# Patient Record
Sex: Female | Born: 1965 | Race: Black or African American | Hispanic: No | Marital: Married | Smoking: Never smoker
Health system: Southern US, Community
[De-identification: ages and names within clinical notes are randomized; demographics above are authoritative.]

## PROBLEM LIST (undated history)

## (undated) DIAGNOSIS — D509 Iron deficiency anemia, unspecified: Secondary | ICD-10-CM

## (undated) DIAGNOSIS — A64 Unspecified sexually transmitted disease: Secondary | ICD-10-CM

## (undated) DIAGNOSIS — G43909 Migraine, unspecified, not intractable, without status migrainosus: Secondary | ICD-10-CM

## (undated) DIAGNOSIS — R87610 Atypical squamous cells of undetermined significance on cytologic smear of cervix (ASC-US): Secondary | ICD-10-CM

## (undated) DIAGNOSIS — A630 Anogenital (venereal) warts: Secondary | ICD-10-CM

## (undated) DIAGNOSIS — D071 Carcinoma in situ of vulva: Secondary | ICD-10-CM

## (undated) DIAGNOSIS — Z87410 Personal history of cervical dysplasia: Secondary | ICD-10-CM

## (undated) DIAGNOSIS — C19 Malignant neoplasm of rectosigmoid junction: Secondary | ICD-10-CM

## (undated) DIAGNOSIS — Z923 Personal history of irradiation: Secondary | ICD-10-CM

## (undated) DIAGNOSIS — E785 Hyperlipidemia, unspecified: Secondary | ICD-10-CM

## (undated) DIAGNOSIS — I2699 Other pulmonary embolism without acute cor pulmonale: Secondary | ICD-10-CM

## (undated) DIAGNOSIS — Z87898 Personal history of other specified conditions: Secondary | ICD-10-CM

## (undated) DIAGNOSIS — E782 Mixed hyperlipidemia: Secondary | ICD-10-CM

## (undated) DIAGNOSIS — E119 Type 2 diabetes mellitus without complications: Secondary | ICD-10-CM

## (undated) DIAGNOSIS — R87810 Cervical high risk human papillomavirus (HPV) DNA test positive: Secondary | ICD-10-CM

## (undated) DIAGNOSIS — E079 Disorder of thyroid, unspecified: Secondary | ICD-10-CM

## (undated) DIAGNOSIS — Z973 Presence of spectacles and contact lenses: Secondary | ICD-10-CM

## (undated) DIAGNOSIS — Z87411 Personal history of vaginal dysplasia: Secondary | ICD-10-CM

## (undated) DIAGNOSIS — E039 Hypothyroidism, unspecified: Secondary | ICD-10-CM

## (undated) DIAGNOSIS — Z9221 Personal history of antineoplastic chemotherapy: Secondary | ICD-10-CM

## (undated) DIAGNOSIS — R3915 Urgency of urination: Secondary | ICD-10-CM

## (undated) DIAGNOSIS — Z7901 Long term (current) use of anticoagulants: Secondary | ICD-10-CM

## (undated) DIAGNOSIS — R51 Headache: Secondary | ICD-10-CM

## (undated) HISTORY — DX: Unspecified sexually transmitted disease: A64

## (undated) HISTORY — PX: RECTAL TUMOR BY PROCTOTOMY EXCISION: SUR550

## (undated) HISTORY — DX: Hyperlipidemia, unspecified: E78.5

## (undated) HISTORY — DX: Cervical high risk human papillomavirus (HPV) DNA test positive: R87.810

## (undated) HISTORY — DX: Anogenital (venereal) warts: A63.0

## (undated) HISTORY — PX: OTHER SURGICAL HISTORY: SHX169

## (undated) HISTORY — DX: Atypical squamous cells of undetermined significance on cytologic smear of cervix (ASC-US): R87.610

## (undated) HISTORY — DX: Other pulmonary embolism without acute cor pulmonale: I26.99

## (undated) HISTORY — PX: COLON SURGERY: SHX602

---

## 1989-10-21 DIAGNOSIS — A64 Unspecified sexually transmitted disease: Secondary | ICD-10-CM

## 1989-10-21 HISTORY — DX: Unspecified sexually transmitted disease: A64

## 2000-01-17 ENCOUNTER — Other Ambulatory Visit: Admission: RE | Admit: 2000-01-17 | Discharge: 2000-01-17 | Payer: Self-pay | Admitting: Obstetrics and Gynecology

## 2001-01-29 ENCOUNTER — Other Ambulatory Visit: Admission: RE | Admit: 2001-01-29 | Discharge: 2001-01-29 | Payer: Self-pay | Admitting: Obstetrics and Gynecology

## 2002-02-15 ENCOUNTER — Other Ambulatory Visit: Admission: RE | Admit: 2002-02-15 | Discharge: 2002-02-15 | Payer: Self-pay | Admitting: *Deleted

## 2003-02-17 ENCOUNTER — Other Ambulatory Visit: Admission: RE | Admit: 2003-02-17 | Discharge: 2003-02-17 | Payer: Self-pay | Admitting: Gynecology

## 2003-05-17 ENCOUNTER — Encounter: Admission: RE | Admit: 2003-05-17 | Discharge: 2003-05-17 | Payer: Self-pay | Admitting: Obstetrics and Gynecology

## 2003-05-17 ENCOUNTER — Encounter: Payer: Self-pay | Admitting: Obstetrics and Gynecology

## 2004-01-09 ENCOUNTER — Other Ambulatory Visit: Admission: RE | Admit: 2004-01-09 | Discharge: 2004-01-09 | Payer: Self-pay | Admitting: Gynecology

## 2004-01-17 ENCOUNTER — Inpatient Hospital Stay (HOSPITAL_COMMUNITY): Admission: AD | Admit: 2004-01-17 | Discharge: 2004-01-17 | Payer: Self-pay | Admitting: Gynecology

## 2004-05-01 ENCOUNTER — Encounter: Admission: RE | Admit: 2004-05-01 | Discharge: 2004-05-03 | Payer: Self-pay | Admitting: Gynecology

## 2004-07-24 ENCOUNTER — Inpatient Hospital Stay (HOSPITAL_COMMUNITY): Admission: AD | Admit: 2004-07-24 | Discharge: 2004-07-28 | Payer: Self-pay | Admitting: Gynecology

## 2004-07-25 ENCOUNTER — Encounter (INDEPENDENT_AMBULATORY_CARE_PROVIDER_SITE_OTHER): Payer: Self-pay | Admitting: Specialist

## 2004-09-05 ENCOUNTER — Other Ambulatory Visit: Admission: RE | Admit: 2004-09-05 | Discharge: 2004-09-05 | Payer: Self-pay | Admitting: Obstetrics and Gynecology

## 2004-12-18 ENCOUNTER — Ambulatory Visit: Payer: Self-pay | Admitting: Cardiology

## 2004-12-26 ENCOUNTER — Ambulatory Visit: Payer: Self-pay

## 2005-11-19 ENCOUNTER — Other Ambulatory Visit: Admission: RE | Admit: 2005-11-19 | Discharge: 2005-11-19 | Payer: Self-pay | Admitting: Gynecology

## 2006-07-15 ENCOUNTER — Ambulatory Visit: Payer: Self-pay | Admitting: Cardiology

## 2006-07-23 ENCOUNTER — Ambulatory Visit: Payer: Self-pay

## 2008-08-12 ENCOUNTER — Encounter: Payer: Self-pay | Admitting: Gynecology

## 2008-08-12 ENCOUNTER — Other Ambulatory Visit: Admission: RE | Admit: 2008-08-12 | Discharge: 2008-08-12 | Payer: Self-pay | Admitting: Gynecology

## 2008-08-12 ENCOUNTER — Ambulatory Visit: Payer: Self-pay | Admitting: Gynecology

## 2009-02-20 ENCOUNTER — Ambulatory Visit: Payer: Self-pay | Admitting: Gynecology

## 2009-08-17 ENCOUNTER — Other Ambulatory Visit: Admission: RE | Admit: 2009-08-17 | Discharge: 2009-08-17 | Payer: Self-pay | Admitting: Gynecology

## 2009-08-17 ENCOUNTER — Encounter: Payer: Self-pay | Admitting: Gynecology

## 2009-08-17 ENCOUNTER — Ambulatory Visit: Payer: Self-pay | Admitting: Gynecology

## 2009-08-21 ENCOUNTER — Encounter: Admission: RE | Admit: 2009-08-21 | Discharge: 2009-08-21 | Payer: Self-pay | Admitting: Internal Medicine

## 2009-08-29 ENCOUNTER — Encounter: Admission: RE | Admit: 2009-08-29 | Discharge: 2009-08-29 | Payer: Self-pay | Admitting: Internal Medicine

## 2010-10-21 DIAGNOSIS — I2699 Other pulmonary embolism without acute cor pulmonale: Secondary | ICD-10-CM

## 2010-10-21 DIAGNOSIS — C19 Malignant neoplasm of rectosigmoid junction: Secondary | ICD-10-CM

## 2010-10-21 HISTORY — PX: INSERTION OF VENA CAVA FILTER: SHX5871

## 2010-10-21 HISTORY — DX: Other pulmonary embolism without acute cor pulmonale: I26.99

## 2010-10-21 HISTORY — DX: Malignant neoplasm of rectosigmoid junction: C19

## 2010-11-06 ENCOUNTER — Other Ambulatory Visit: Payer: Self-pay | Admitting: Gynecology

## 2010-11-06 ENCOUNTER — Ambulatory Visit
Admission: RE | Admit: 2010-11-06 | Discharge: 2010-11-06 | Payer: Self-pay | Source: Home / Self Care | Attending: Gynecology | Admitting: Gynecology

## 2010-11-06 ENCOUNTER — Other Ambulatory Visit
Admission: RE | Admit: 2010-11-06 | Discharge: 2010-11-06 | Payer: Self-pay | Source: Home / Self Care | Admitting: Gynecology

## 2010-11-12 ENCOUNTER — Ambulatory Visit
Admission: RE | Admit: 2010-11-12 | Discharge: 2010-11-12 | Payer: Self-pay | Source: Home / Self Care | Attending: Gynecology | Admitting: Gynecology

## 2010-12-22 ENCOUNTER — Emergency Department (HOSPITAL_COMMUNITY): Payer: 59

## 2010-12-22 ENCOUNTER — Inpatient Hospital Stay (HOSPITAL_COMMUNITY)
Admission: EM | Admit: 2010-12-22 | Discharge: 2010-12-25 | DRG: 176 | Disposition: A | Discharge status: Home / Self Care | Payer: 59 | Attending: Internal Medicine | Admitting: Internal Medicine

## 2010-12-22 DIAGNOSIS — R079 Chest pain, unspecified: Secondary | ICD-10-CM | POA: Diagnosis present

## 2010-12-22 DIAGNOSIS — Z86711 Personal history of pulmonary embolism: Secondary | ICD-10-CM

## 2010-12-22 DIAGNOSIS — E119 Type 2 diabetes mellitus without complications: Secondary | ICD-10-CM | POA: Diagnosis present

## 2010-12-22 DIAGNOSIS — T384X5A Adverse effect of oral contraceptives, initial encounter: Secondary | ICD-10-CM | POA: Diagnosis present

## 2010-12-22 DIAGNOSIS — I2699 Other pulmonary embolism without acute cor pulmonale: Principal | ICD-10-CM | POA: Diagnosis present

## 2010-12-22 HISTORY — DX: Personal history of pulmonary embolism: Z86.711

## 2010-12-22 LAB — DIFFERENTIAL
Eosinophils Relative: 2 % (ref 0–5)
Lymphocytes Relative: 22 % (ref 12–46)
Lymphs Abs: 1.1 10*3/uL (ref 0.7–4.0)
Monocytes Absolute: 0.5 10*3/uL (ref 0.1–1.0)
Neutro Abs: 3.4 10*3/uL (ref 1.7–7.7)

## 2010-12-22 LAB — COMPREHENSIVE METABOLIC PANEL
ALT: 15 U/L (ref 0–35)
AST: 14 U/L (ref 0–37)
Albumin: 3.6 g/dL (ref 3.5–5.2)
Alkaline Phosphatase: 92 U/L (ref 39–117)
CO2: 24 mEq/L (ref 19–32)
Chloride: 107 mEq/L (ref 96–112)
Creatinine, Ser: 0.81 mg/dL (ref 0.4–1.2)
GFR calc Af Amer: >60 mL/min (ref >60)
GFR calc non Af Amer: >60 mL/min (ref >60)
Potassium: 3.8 mEq/L (ref 3.5–5.1)
Total Bilirubin: 0.8 mg/dL (ref 0.3–1.2)

## 2010-12-22 LAB — GLUCOSE, CAPILLARY
Glucose-Capillary: 130 mg/dL — ABNORMAL HIGH (ref 70–99)
Glucose-Capillary: 130 mg/dL — ABNORMAL HIGH (ref 70–99)

## 2010-12-22 LAB — CBC
HCT: 38.9 % (ref 36.0–46.0)
Hemoglobin: 13.1 g/dL (ref 12.0–15.0)
MCHC: 33.7 g/dL (ref 30.0–36.0)
MCV: 90.5 fL (ref 78.0–100.0)
RDW: 12.2 % (ref 11.5–15.5)
WBC: 5.1 10*3/uL (ref 4.0–10.5)

## 2010-12-22 LAB — URINALYSIS, ROUTINE W REFLEX MICROSCOPIC
Bilirubin Urine: NEGATIVE
Glucose, UA: NEGATIVE mg/dL
Hgb urine dipstick: NEGATIVE
Specific Gravity, Urine: 1.034 — ABNORMAL HIGH (ref 1.005–1.030)
Urobilinogen, UA: 1 mg/dL (ref 0.0–1.0)

## 2010-12-22 LAB — D-DIMER, QUANTITATIVE: D-Dimer, Quant: 2.58 ug/mL-FEU — ABNORMAL HIGH (ref 0.00–0.48)

## 2010-12-22 LAB — CARDIAC PANEL(CRET KIN+CKTOT+MB+TROPI)
CK, MB: 0.8 ng/mL (ref 0.3–4.0)
Relative Index: INVALID (ref 0.0–2.5)
Total CK: 59 U/L (ref 7–177)
Troponin I: <0.01 ng/mL (ref 0.00–0.06)
Troponin I: <0.01 ng/mL (ref 0.00–0.06)

## 2010-12-22 LAB — POCT PREGNANCY, URINE: Preg Test, Ur: NEGATIVE

## 2010-12-22 MED ORDER — IOHEXOL 300 MG/ML  SOLN
100.0000 mL | Freq: Once | INTRAMUSCULAR | Status: AC | PRN
  Administered 2010-12-22: 100 mL via INTRAVENOUS

## 2010-12-23 LAB — GLUCOSE, CAPILLARY: Glucose-Capillary: 138 mg/dL — ABNORMAL HIGH (ref 70–99)

## 2010-12-23 LAB — CBC
HCT: 35.5 % — ABNORMAL LOW (ref 36.0–46.0)
Hemoglobin: 11.6 g/dL — ABNORMAL LOW (ref 12.0–15.0)
MCH: 29.9 pg (ref 26.0–34.0)
MCV: 91.5 fL (ref 78.0–100.0)
Platelets: 219 10*3/uL (ref 150–400)
RBC: 3.88 MIL/uL (ref 3.87–5.11)
WBC: 4.6 10*3/uL (ref 4.0–10.5)

## 2010-12-23 LAB — COMPREHENSIVE METABOLIC PANEL
ALT: 12 U/L (ref 0–35)
AST: 11 U/L (ref 0–37)
Albumin: 3.2 g/dL — ABNORMAL LOW (ref 3.5–5.2)
CO2: 27 mEq/L (ref 19–32)
Calcium: 9 mg/dL (ref 8.4–10.5)
Chloride: 106 mEq/L (ref 96–112)
Creatinine, Ser: 0.79 mg/dL (ref 0.4–1.2)
GFR calc Af Amer: >60 mL/min (ref >60)
Sodium: 139 mEq/L (ref 135–145)
Total Bilirubin: 0.8 mg/dL (ref 0.3–1.2)

## 2010-12-23 LAB — HEMOGLOBIN A1C
Hgb A1c MFr Bld: 6.5 % — ABNORMAL HIGH (ref <5.7)
Mean Plasma Glucose: 140 mg/dL — ABNORMAL HIGH (ref <117)

## 2010-12-23 LAB — APTT: aPTT: 70 seconds — ABNORMAL HIGH (ref 24–37)

## 2010-12-24 LAB — CBC
HCT: 35.9 % — ABNORMAL LOW (ref 36.0–46.0)
Platelets: 246 10*3/uL (ref 150–400)
RBC: 3.93 MIL/uL (ref 3.87–5.11)
RDW: 12.1 % (ref 11.5–15.5)

## 2010-12-24 LAB — PROTEIN S ACTIVITY: Protein S Activity: 90 % (ref 69–129)

## 2010-12-24 LAB — LUPUS ANTICOAGULANT PANEL
DRVVT: 45 secs — ABNORMAL HIGH (ref 36.2–44.3)
Lupus Anticoagulant: NOT DETECTED
PTT Lupus Anticoagulant: 59.4 secs — ABNORMAL HIGH (ref 30.0–45.6)
dRVVT Incubated 1:1 Mix: 41.7 secs (ref 36.2–44.3)

## 2010-12-24 LAB — PROTIME-INR
INR: 1.7 — ABNORMAL HIGH (ref 0.00–1.49)
Prothrombin Time: 20.2 seconds — ABNORMAL HIGH (ref 11.6–15.2)

## 2010-12-24 LAB — GLUCOSE, CAPILLARY
Glucose-Capillary: 132 mg/dL — ABNORMAL HIGH (ref 70–99)
Glucose-Capillary: 129 mg/dL — ABNORMAL HIGH (ref 70–99)

## 2010-12-25 LAB — CBC
HCT: 39.7 % (ref 36.0–46.0)
MCHC: 32.5 g/dL (ref 30.0–36.0)
Platelets: 263 10*3/uL (ref 150–400)
RDW: 12 % (ref 11.5–15.5)
WBC: 4.7 10*3/uL (ref 4.0–10.5)

## 2010-12-25 LAB — CARDIOLIPIN ANTIBODIES, IGG, IGM, IGA
Anticardiolipin IgA: 4 APL U/mL — ABNORMAL LOW (ref <22)
Anticardiolipin IgG: 9 GPL U/mL — ABNORMAL LOW (ref <23)

## 2010-12-25 LAB — PROTIME-INR
INR: 1.78 — ABNORMAL HIGH (ref 0.00–1.49)
Prothrombin Time: 20.9 seconds — ABNORMAL HIGH (ref 11.6–15.2)

## 2010-12-25 LAB — BETA-2-GLYCOPROTEIN I ABS, IGG/M/A
Beta-2 Glyco I IgG: 0 G Units (ref <20)
Beta-2-Glycoprotein I IgA: 4 A Units (ref <20)

## 2010-12-25 LAB — GLUCOSE, CAPILLARY: Glucose-Capillary: 144 mg/dL — ABNORMAL HIGH (ref 70–99)

## 2010-12-26 LAB — GLUCOSE, CAPILLARY: Glucose-Capillary: 131 mg/dL — ABNORMAL HIGH (ref 70–99)

## 2011-01-17 ENCOUNTER — Telehealth: Payer: Self-pay | Admitting: Internal Medicine

## 2011-01-17 DIAGNOSIS — I2699 Other pulmonary embolism without acute cor pulmonale: Secondary | ICD-10-CM

## 2011-01-17 NOTE — Telephone Encounter (Signed)
Dr Loreta Ave called. Thsi patient tel [phone] has PE but no pulmonary . Now recutal bleeding but has not dropped hgb so far. Dr. Loreta Ave wants her seen in coumadin clinic [place] on Friday 01/18/2011 so patient can come off coumadin and lovenox bridge and she can do colonoscopy early next week. Patient also needs pulm for PE. So, DR. Loreta Ave wants me to see patient ASAP. Pls give patient appt to see me Monday 01/21/2011

## 2011-01-18 ENCOUNTER — Encounter: Payer: Self-pay | Admitting: *Deleted

## 2011-01-18 ENCOUNTER — Ambulatory Visit (INDEPENDENT_AMBULATORY_CARE_PROVIDER_SITE_OTHER): Payer: 59 | Admitting: *Deleted

## 2011-01-18 DIAGNOSIS — I2699 Other pulmonary embolism without acute cor pulmonale: Secondary | ICD-10-CM

## 2011-01-18 LAB — POCT INR: INR: 2.3

## 2011-01-18 MED ORDER — ENOXAPARIN SODIUM 100 MG/ML ~~LOC~~ SOLN: 100.0000 mg | Freq: Two times a day (BID) | SUBCUTANEOUS | Status: DC

## 2011-01-18 NOTE — Patient Instructions (Addendum)
INR 2.3  Start Lovenox 100mg  injection every 12 hours on Saturday morning.  Take your last dose of Coumadin the morning before your procedure is scheduled.  After your procedure, Dr. Loreta Ave will tell you when it is okay to restart your anticoagulation.  Restart Coumadin AND Lovenox.  Take 7.5mg  x 2 days of Coumadin then resume same dose of 5mg  daily except 7.5mg  on Monday, Wednesday and Friday.  Continue Lovenox 100mg  every 12 hours until INR is >2.0.  Make appt with Dr. [name] ~5 days after your procedure to recheck INR.

## 2011-01-18 NOTE — Patient Instructions (Signed)
INR 2.3  Start Lovenox 100mg  injection into abdomen on Saturday morning.  Take 1 injection every 12 hours.  The day prior to your injection, only take your Lovenox in the morning.  Dr. Loreta Ave will let you know when it is okay to restart anticoagulation after your colonoscopy.  Restart Coumadin AND Lovenox.  Take 7.5mg  of Coumadin x 2 days then resume your normal dose of 5mg  daily except 7.5mg  on Monday, Wednesday and Friday.  Continue Lovenox 100mg  every 12 hours until your INR is >2.0.  Make an appt with Dr. [name] approximately 5 days after your procedure to recheck your INR.

## 2011-01-18 NOTE — Telephone Encounter (Signed)
Order placed for coumadin clinic referral and appt set with MR on Monday at 3pm. Pt aware. Carron Curie, CMA

## 2011-01-18 NOTE — Progress Notes (Signed)
See anticoag note

## 2011-01-21 ENCOUNTER — Encounter: Payer: Self-pay | Admitting: Internal Medicine

## 2011-01-21 ENCOUNTER — Ambulatory Visit (INDEPENDENT_AMBULATORY_CARE_PROVIDER_SITE_OTHER): Payer: 59 | Admitting: Internal Medicine

## 2011-01-21 VITALS — BP 124/86 | HR 64 | Temp 98.1°F | Ht 71.0 in | Wt 206.6 lb

## 2011-01-21 DIAGNOSIS — I2699 Other pulmonary embolism without acute cor pulmonale: Secondary | ICD-10-CM

## 2011-01-21 NOTE — Progress Notes (Signed)
Subjective:    Patient ID: [name], female    DOB: 1966-09-13, 45 y.o.   MRN: [id number]  HPI 45 year old overweight female who has a sedentary Sales promotion account executive job with [employer]. Never smoker. Had been on oral contraceptives since 2005.  Past 1 year - longer work hours (10-12 hour days). Since Jan 2012 noticed non specific bilateral lower extremity aches. Then, new diagnosis of diabetes end feb 2012 (exercise and diet regimen). Developed acute  rt lower chest and rt upper shoulder pleuritic pain on 12/21/2010 but became excruciating on 12/22/2010. Did not have associated dyspnea, hemoptysis, pedal edema In ER d-dimer high. CT chest showed bilateral lowe lobe PE but she was not hypoxemic. Had normal antithrombin III, protein C and protein S levels and negative facto 5 def  per dc summary. Denies increased sedentary levels or recent travel prior to PE. Rx with anticoagulation. Mammogram end 2010 normal per hx. Pap smear Jan 2012 normal per hx. Improved pain at discharge. However, still has some residual right sided chest discomfort that is mild, and brought on by lot of body movements. INR being tracked by PA of Dr. [name]. On Friday 01/18/2011 INR 3.3.  However, around 2 weeks ago noticed rectal bleeding while on coumadin Rx. Mixed with stools and notices it at end and when she wipes bottom. So saw Dr. Loreta Ave on 01/18/2011 - hgb unchanged per Dr. Loreta Ave verbal report. Denies straining or constipation, abdominal pain, nausea, vomit, diarrhea. Currently on lovenox shots for possible colonoscopy.  Review of Systems   Constitutional:   No  weight loss, night sweats,  Fevers, chills, fatigue, lassitude. HEENT:   No headaches,  Difficulty swallowing,  Tooth/dental problems,  Sore throat,                No sneezing, itching, ear ache, nasal congestion, post nasal drip,   CV:   Orthopnea, PND, swelling in lower extremities, anasarca, dizziness, palpitations. RT MUSCOLOSKELETAL PAIN +  GI  No heartburn,  indigestion, abdominal pain, nausea, vomiting, diarrhea, change in bowel habits, loss of appetite  Resp: No shortness of breath with exertion or at rest.  No excess mucus, no productive cough,  No non-productive cough,  No coughing up of blood.  No change in color of mucus.  No wheezing.  No chest wall deformity  Skin: no rash or lesions.  GU: no dysuria, change in color of urine, no urgency or frequency.  No flank pain.  MS:  No joint pain or swelling.  No decreased range of motion.  No back pain.  Psych:  No change in mood or affect. No depression or anxiety.  No memory loss.    Objective:   Physical Exam  Vitals reviewed. Constitutional: She is oriented to person, place, and time. She appears well-developed and well-nourished. No distress.  HENT:  Head: Normocephalic and atraumatic.  Right Ear: External ear normal.  Left Ear: External ear normal.  Mouth/Throat: Oropharynx is clear and moist. No oropharyngeal exudate.  Eyes: Conjunctivae and EOM are normal. Pupils are equal, round, and reactive to light. Right eye exhibits no discharge. Left eye exhibits no discharge. No scleral icterus.  Neck: Normal range of motion. Neck supple. No JVD present. No tracheal deviation present. No thyromegaly present.  Cardiovascular: Normal rate, regular rhythm, normal heart sounds and intact distal pulses.  Exam reveals no gallop and no friction rub.   No murmur heard. Pulmonary/Chest: Effort normal and breath sounds normal. No respiratory distress. She has no  wheezes. She has no rales. She exhibits no tenderness.  Abdominal: Soft. Bowel sounds are normal. She exhibits no distension and no mass. There is no tenderness. There is no rebound and no guarding.  Musculoskeletal: Normal range of motion. She exhibits no edema and no tenderness.  Lymphadenopathy:    She has no cervical adenopathy.  Neurological: She is alert and oriented to person, place, and time. She has normal reflexes. No cranial nerve  deficit. She exhibits normal muscle tone. Coordination normal.  Skin: Skin is warm and dry. No rash noted. She is not diaphoretic. No erythema. No pallor.  Psychiatric: She has a normal mood and affect. Her behavior is normal. Judgment and thought content normal.          Assessment & Plan:

## 2011-01-21 NOTE — Patient Instructions (Signed)
Avoid oral contraceptives Follow coumadin advice per Dr. [name] and coumadin clinic Do not take lovenox night before and morning of your procedure Can start non-collision, non-fall risk exercise REturn to see me in 2 months for pain If pain gets worse come sooner Till then try tylenol or ibuprofen as needed for pain

## 2011-01-21 NOTE — Assessment & Plan Note (Signed)
New onset PE March 3. 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 So far other risk factors are negative. Currently well and does not desaturate but has right musculoskeletal pain that I Suspect is resolving pleurisy. She should be on anticoagulation minimum 6 months. At end of that Rx, I will reassess d-dimer and hypercoag profile and discuss longer Rx. IF pain persists past 2 months, then repeat VQ scan. Ok to have colonoscopy (coumadin clinic gave instructions) this week after 01/23/2011

## 2011-01-23 ENCOUNTER — Other Ambulatory Visit: Payer: Self-pay | Admitting: Gastroenterology

## 2011-01-23 DIAGNOSIS — R933 Abnormal findings on diagnostic imaging of other parts of digestive tract: Secondary | ICD-10-CM

## 2011-01-24 ENCOUNTER — Other Ambulatory Visit: Payer: 59

## 2011-01-24 ENCOUNTER — Ambulatory Visit (HOSPITAL_COMMUNITY)
Admission: RE | Admit: 2011-01-24 | Discharge: 2011-01-24 | Disposition: A | Discharge status: Home / Self Care | Payer: 59 | Source: Ambulatory Visit | Attending: Vascular Surgery | Admitting: Vascular Surgery

## 2011-01-24 DIAGNOSIS — C189 Malignant neoplasm of colon, unspecified: Secondary | ICD-10-CM | POA: Insufficient documentation

## 2011-01-24 DIAGNOSIS — T81718A Complication of other artery following a procedure, not elsewhere classified, initial encounter: Secondary | ICD-10-CM

## 2011-01-24 DIAGNOSIS — I2699 Other pulmonary embolism without acute cor pulmonale: Secondary | ICD-10-CM | POA: Insufficient documentation

## 2011-01-24 LAB — POCT I-STAT, CHEM 8
Creatinine, Ser: 0.9 mg/dL (ref 0.4–1.2)
HCT: 40 % (ref 36.0–46.0)
Hemoglobin: 13.6 g/dL (ref 12.0–15.0)
Potassium: 3.5 mEq/L (ref 3.5–5.1)
Sodium: 140 mEq/L (ref 135–145)
TCO2: 22 mmol/L (ref 0–100)

## 2011-01-24 LAB — PROTIME-INR
INR: 1.13 (ref 0.00–1.49)
Prothrombin Time: 14.7 seconds (ref 11.6–15.2)

## 2011-01-24 LAB — HCG, SERUM, QUALITATIVE: Preg, Serum: NEGATIVE

## 2011-01-25 NOTE — Op Note (Signed)
NAMEMARCEIL, [name]                ACCOUNT NO.:  [id number]  MEDICAL RECORD NO.:  [id number]           PATIENT TYPE:  O  LOCATION:  SDSC                         FACILITY:  [facility]  PHYSICIAN:  Fransisco Hertz, MD       DATE OF BIRTH:  1965-11-02  DATE OF PROCEDURE: DATE OF DISCHARGE:                              OPERATIVE REPORT   PROCEDURE: 1. Left common femoral vein cannulation under ultrasound guidance. 2. Inferior vena cavogram. 3. Placement of OptEase inferior vena caval filter.  PREOPERATIVE DIAGNOSES:  History of pulmonary embolism and colon cancer.  POSTOPERATIVE DIAGNOSES:  History of pulmonary embolism and colon cancer.  SURGEON:  Arlys John L. Imogene Burn, MD  ANESTHESIA:  Local.  ESTIMATED BLOOD LOSS:  Minimal.  CONTRAST:  30 mL.  SPECIMENS:  None.  FINDINGS:  The lowest renal vein was visualized at the bottom of L1. The inferior vena caval filter was deployed slightly below the top of L2 in proper orientation.INDICATIONS:  This is a 45 year old female that recently had a pulmonary embolism.  Her workup for such discovered a colon cancer.  The patient now is being scheduled for a colectomy and subsequently her anticoagulation with Coumadin had to be interrupted.  She is going to subsequently need a retrievable inferior vena caval filter placement during the immediate perioperative period to prevent her from having a death due to pulmonary embolism.  The patient is aware of the risks of this procedure including bleeding, infection, possible complications related to the inferior vena caval filter including migration, puncture of adjacent structures, filter fracture and possible inability to retrieve the filter.  She is aware of these risks and agreed to proceed forward with such.  DESCRIPTION OF THE OPERATION:  After full informed written consent was obtained from the patient, she was brought to the angio suite, placed supine upon angio table.  She was connected to the  monitoring equipment and then she was prepped and draped in standard fashion for right femoral cannulation.  I turned my attention to her right groin under ultrasound guidance.  I cannulated the right common femoral vein.  With an 18-gauge needle, I passed a Bentson wire up into the inferior vena cava.  Then the needle was exchanged for the OpTease deployment sheath which was placed over the wire and advanced at the level of about L3-4. The dilator was then connected to power injector and then a power injection was completed.  The inferior vena cava was noted be widely patent and of adequate size for use of the OpTease device.  At this point the sheath and dilator were repositioned to the level of the top of L2.  We then removed the dilator and then loaded the sheath with the  appropriate orientation of the OpTease device.  Using the push rod, we pushed until the top of the sheath.  The device was then deployed while holding the rod and pulling back the sheath.  This deployed the device a little bit lower than the top of L2.  It was in the proper orientation. We completed one last image to document the position of such.  At this point then the push rod was removed, the sheath was aspirated and flushed with heparinized saline and it was removed and pressure was held to the right groin for few minutes.  The patient sustained no bleeding complications.  COMPLICATIONS:  None.  Condition was stable.     Fransisco Hertz, MD     BLC/MEDQ  D:  01/24/2011  T:  01/25/2011  Job:  [id number]  Electronically Signed by Leonides Sake MD on 01/25/2011 05:41:33 PM

## 2011-01-26 NOTE — Discharge Summary (Signed)
  [name], [name]                ACCOUNT NO.:  [id number]  MEDICAL RECORD NO.:  [id number]           PATIENT TYPE:  I  LOCATION:  1420                         FACILITY:  [facility]  PHYSICIAN:  Richarda Overlie, MD       DATE OF BIRTH:  1966/07/26  DATE OF ADMISSION:  12/22/2010 DATE OF DISCHARGE:  12/25/2010                        DISCHARGE SUMMARY - REFERRING   DISCHARGE DIAGNOSES: 1. Bilateral lower lobe pulmonary embolism, likely secondary to OCP     use. 2. Chest pain secondary to pulmonary embolism.  SUBJECTIVE:  This is a 45 year old female with new onset diabetes and also history of use of oral contraceptives who presents to the ED with right-sided chest pain as well as back pain.  She has an elevated D- dimer in the ED and a CT angiogram revealed that the patient has bilateral lower lobe pulmonary embolism.  The patient was admitted for further evaluation.  Bilateral PE, so the patient will be started on Lovenox and Coumadin, and a hypercoagulable panel was done that showed normal antithrombin III, protein C and protein S levels.  The patient was initiated on Coumadin with a quick jump in her INR from normal to 1.78 and therefore the patient was hospitalized for another 3 days.  The patient's INR prior to discharge was 1.78.  She has been advised to continue with Lovenox injections and discontinue Lovenox after March 8. She will follow up with Dr. Andi Devon for her INR checks.  She has been advised to discontinue her oral contraceptive use.  She has some significant relief as far as her back pain is concerned with some p.r.n. Percocet in the hospital.  She was found to be quite sensitive to IV Dilaudid therapy with significant nausea and vomiting.  PLAN:  The patient is being discharged today.  She will be assessed for home oxygen requirements prior to discharge and Dr. Andi Devon will be following the patient's INR outpatient.  She will need anticoagulation for  a minimum of 6 months.  DISCHARGE MEDICATIONS: 1. Coumadin 7.5 mg p.o. daily. 2. Lovenox 95 mg subcutaneous twice daily. 3. Percocet one to two tablets p.o. q.8 hours. 4. Excedrin Migraine one to two tablets p.o. q.6 p.r.n.     Richarda Overlie, MD     NA/MEDQ  D:  12/25/2010  T:  12/25/2010  Job:  [id number]  Electronically Signed by Richarda Overlie MD on 01/26/2011 08:34:57 PM

## 2011-01-28 ENCOUNTER — Ambulatory Visit
Admit: 2011-01-28 | Discharge: 2011-01-28 | Disposition: A | Discharge status: Home / Self Care | Payer: 59 | Attending: General Surgery | Admitting: General Surgery

## 2011-01-28 MED ORDER — IOHEXOL 300 MG/ML  SOLN
125.0000 mL | Freq: Once | INTRAMUSCULAR | Status: AC | PRN
  Administered 2011-01-28: 125 mL via INTRAVENOUS

## 2011-01-30 ENCOUNTER — Encounter (HOSPITAL_COMMUNITY)
Admission: RE | Admit: 2011-01-30 | Discharge: 2011-01-30 | Disposition: A | Discharge status: Home / Self Care | Payer: 59 | Source: Ambulatory Visit | Attending: General Surgery | Admitting: General Surgery

## 2011-01-30 LAB — CBC
HCT: 38.6 % (ref 36.0–46.0)
Hemoglobin: 13 g/dL (ref 12.0–15.0)
MCHC: 33.7 g/dL (ref 30.0–36.0)
RDW: 12.7 % (ref 11.5–15.5)
WBC: 3.7 10*3/uL — ABNORMAL LOW (ref 4.0–10.5)

## 2011-01-30 LAB — DIFFERENTIAL
Basophils Absolute: 0 10*3/uL (ref 0.0–0.1)
Basophils Relative: 1 % (ref 0–1)
Lymphocytes Relative: 39 % (ref 12–46)
Monocytes Absolute: 0.2 10*3/uL (ref 0.1–1.0)
Neutro Abs: 1.9 10*3/uL (ref 1.7–7.7)

## 2011-01-30 LAB — SURGICAL PCR SCREEN: MRSA, PCR: NEGATIVE

## 2011-01-31 ENCOUNTER — Inpatient Hospital Stay (HOSPITAL_COMMUNITY)
Admission: RE | Admit: 2011-01-31 | Discharge: 2011-02-05 | DRG: 333 | Disposition: A | Discharge status: Home / Self Care | Payer: 59 | Source: Ambulatory Visit | Attending: General Surgery | Admitting: General Surgery

## 2011-01-31 ENCOUNTER — Inpatient Hospital Stay (HOSPITAL_COMMUNITY): Admission: RE | Admit: 2011-01-31 | Payer: 59 | Source: Ambulatory Visit | Admitting: General Surgery

## 2011-01-31 ENCOUNTER — Other Ambulatory Visit: Payer: Self-pay | Admitting: General Surgery

## 2011-01-31 DIAGNOSIS — C772 Secondary and unspecified malignant neoplasm of intra-abdominal lymph nodes: Secondary | ICD-10-CM | POA: Diagnosis present

## 2011-01-31 DIAGNOSIS — Z23 Encounter for immunization: Secondary | ICD-10-CM

## 2011-01-31 DIAGNOSIS — C20 Malignant neoplasm of rectum: Principal | ICD-10-CM | POA: Diagnosis present

## 2011-01-31 DIAGNOSIS — Z7901 Long term (current) use of anticoagulants: Secondary | ICD-10-CM

## 2011-01-31 DIAGNOSIS — E119 Type 2 diabetes mellitus without complications: Secondary | ICD-10-CM | POA: Diagnosis present

## 2011-01-31 DIAGNOSIS — Z86711 Personal history of pulmonary embolism: Secondary | ICD-10-CM

## 2011-01-31 DIAGNOSIS — Z01812 Encounter for preprocedural laboratory examination: Secondary | ICD-10-CM

## 2011-01-31 DIAGNOSIS — Z79899 Other long term (current) drug therapy: Secondary | ICD-10-CM

## 2011-01-31 HISTORY — PX: LOW ANTERIOR BOWEL RESECTION: SUR1240

## 2011-01-31 LAB — CROSSMATCH
ABO/RH(D): A POS
Antibody Screen: NEGATIVE
Unit division: 0
Unit division: 0

## 2011-01-31 LAB — BASIC METABOLIC PANEL
BUN: 12 mg/dL (ref 6–23)
CO2: 23 mEq/L (ref 19–32)
Glucose, Bld: 114 mg/dL — ABNORMAL HIGH (ref 70–99)
Potassium: 4.4 mEq/L (ref 3.5–5.1)
Sodium: 135 mEq/L (ref 135–145)

## 2011-01-31 LAB — GLUCOSE, CAPILLARY
Glucose-Capillary: 155 mg/dL — ABNORMAL HIGH (ref 70–99)
Glucose-Capillary: 200 mg/dL — ABNORMAL HIGH (ref 70–99)
Glucose-Capillary: 221 mg/dL — ABNORMAL HIGH (ref 70–99)

## 2011-01-31 LAB — TYPE AND SCREEN

## 2011-01-31 NOTE — H&P (Signed)
NAME:  [name], [name]                ACCOUNT NO.:  [id number]  MEDICAL RECORD NO.:  [id number]           PATIENT TYPE:  E  LOCATION:  WLED                         [facility]ACILITY:  [facility]  PHYSICIAN:  Massie Maroon, MD        DATE OF BIRTH:  1966-02-18  DATE OF ADMISSION:  12/22/2010 DATE OF DISCHARGE:                             HISTORY & PHYSICAL   CHIEF COMPLAINT:  "I have got back pain."  HISTORY OF PRESENT ILLNESS:  A 45 year old female with new-onset diabetes, apparently complains of right-sided back pain that got worse, radiated to shoulder and was more intense and so presented tonight.  The ER was clever enough to check a D-dimer even though she was not hypoxic and it was positive.  CT angio chest revealed that the patient had bilateral lower pulmonary emboli.  The patient appears to have been on birth control pills and this appears to have been her major risk factor for blood clots.  There is no family history of hypercoagulable state. The patient also noted to be short of breath.  Denies any fever, chills, cough, palpitations, nausea, vomiting, or diaphoresis.  The patient will be admitted for bilateral pulmonary emboli as stated above.  PAST MEDICAL HISTORY:  Diabetes type 2 (recent diagnosis).  PAST SURGICAL HISTORY:  None.  SOCIAL HISTORY:  The patient does not smoke or drink.  She is married and has 3 children.  FAMILY HISTORY:  Mother is alive at age 32 but has metastatic lung cancer going to the brain.  She is a former smoker.  Father died at age 30 and he had a heart attack and was a nonsmoker.  There is no family history of blood clots.  ALLERGIES:  PENICILLIN.  MEDICATIONS:  Griffin Dakin one p.o. daily.  REVIEW OF SYSTEMS:  Negative for all 10 organ systems except for pertinent positives stated above.  PHYSICAL EXAMINATION:  VITAL SIGNS:  Temperature 98.1, pulse 78, blood pressure 130/84, and pulse ox is 98% on room air. HEENT:  Anicteric, EOMI.  No nystagmus.   Pupils 1.5 mm, symmetric direct consensual near reflexes intact.  Mucous membranes moist. NECK:  No JVD, no bruit, no thyromegaly, no adenopathy. HEART:  Regular rate and rhythm.  S1-S2.  No murmurs, gallops, or rubs. LUNGS:  Clear to auscultation bilaterally. ABDOMEN:  Soft, nontender, and nondistended.  Positive bowel sounds. EXTREMITIES:  No cyanosis, clubbing, or edema. SKIN:  No rashes. LYMPH NODES:  No adenopathy. NEURO:  Nonfocal.  LABORATORY DATA:  CT angio chest bilateral lower pulmonary emboli, normal thoracic aorta, bilateral atelectasis, right greater than left.  Lipase 20.  Sodium 137, potassium 3.8, BUN 13, creatinine 0.81, AST 14, ALT 15, alk phos 582, total bilirubin 0.8.  WBC 5.1, hemoglobin 13.1, and platelet count is 234,000.  Urine pregnancy test negative. Urinalysis is negative.  ASSESSMENT/PLAN: 1. Bilateral pulmonary emboli.  The patient will be placed on a     telemetry.  We will check CK, CK-MB, troponin I q.6h. x3 sets.  We     will check hypercoagulable panel because of her young age, though  oral contraceptives will probably be a predisposing factor to her     having blood clot.  The patient will be placed on Lovenox 1 mg/kg     subcutaneous b.i.d. along with Coumadin pharmacy to dose. 2. Diabetes type 2.  Fingerstick blood sugars a.c. and at bedtime.     NovoLog sensitive sliding scale.     Massie Maroon, MD     JYK/MEDQ  D:  12/22/2010  T:  12/22/2010  Job:  [id number]  Electronically Signed by Pearson Grippe MD on 01/31/2011 12:58:24 AM

## 2011-02-01 LAB — BASIC METABOLIC PANEL
BUN: 7 mg/dL (ref 6–23)
CO2: 25 mEq/L (ref 19–32)
Calcium: 8.2 mg/dL — ABNORMAL LOW (ref 8.4–10.5)
Chloride: 107 mEq/L (ref 96–112)
Creatinine, Ser: 0.79 mg/dL (ref 0.4–1.2)
GFR calc Af Amer: >60 mL/min (ref >60)
Glucose, Bld: 153 mg/dL — ABNORMAL HIGH (ref 70–99)

## 2011-02-01 LAB — GLUCOSE, CAPILLARY
Glucose-Capillary: 121 mg/dL — ABNORMAL HIGH (ref 70–99)
Glucose-Capillary: 123 mg/dL — ABNORMAL HIGH (ref 70–99)

## 2011-02-02 LAB — GLUCOSE, CAPILLARY
Glucose-Capillary: 101 mg/dL — ABNORMAL HIGH (ref 70–99)
Glucose-Capillary: 115 mg/dL — ABNORMAL HIGH (ref 70–99)
Glucose-Capillary: 106 mg/dL — ABNORMAL HIGH (ref 70–99)

## 2011-02-02 LAB — HEPARIN LEVEL (UNFRACTIONATED): Heparin Unfractionated: 0.27 IU/mL — ABNORMAL LOW (ref 0.30–0.70)

## 2011-02-03 LAB — GLUCOSE, CAPILLARY
Glucose-Capillary: 121 mg/dL — ABNORMAL HIGH (ref 70–99)
Glucose-Capillary: 129 mg/dL — ABNORMAL HIGH (ref 70–99)
Glucose-Capillary: 115 mg/dL — ABNORMAL HIGH (ref 70–99)
Glucose-Capillary: 120 mg/dL — ABNORMAL HIGH (ref 70–99)

## 2011-02-03 LAB — CBC
HCT: 34.7 % — ABNORMAL LOW (ref 36.0–46.0)
Hemoglobin: 11.2 g/dL — ABNORMAL LOW (ref 12.0–15.0)
MCH: 29 pg (ref 26.0–34.0)
MCHC: 32.3 g/dL (ref 30.0–36.0)
MCV: 89.9 fL (ref 78.0–100.0)
Platelets: 229 K/uL (ref 150–400)
RBC: 3.86 MIL/uL — ABNORMAL LOW (ref 3.87–5.11)
RDW: 12.9 % (ref 11.5–15.5)
WBC: 6.1 K/uL (ref 4.0–10.5)

## 2011-02-03 LAB — HEPARIN LEVEL (UNFRACTIONATED)

## 2011-02-04 LAB — GLUCOSE, CAPILLARY
Glucose-Capillary: 122 mg/dL — ABNORMAL HIGH (ref 70–99)
Glucose-Capillary: 130 mg/dL — ABNORMAL HIGH (ref 70–99)
Glucose-Capillary: 108 mg/dL — ABNORMAL HIGH (ref 70–99)

## 2011-02-04 LAB — HEPARIN LEVEL (UNFRACTIONATED): Heparin Unfractionated: 0.22 IU/mL — ABNORMAL LOW (ref 0.30–0.70)

## 2011-02-04 LAB — CBC
MCH: 29.6 pg (ref 26.0–34.0)
MCHC: 33.3 g/dL (ref 30.0–36.0)
Platelets: 238 10*3/uL (ref 150–400)
RDW: 13 % (ref 11.5–15.5)

## 2011-02-05 LAB — PROTIME-INR
INR: 1.21 (ref 0.00–1.49)
Prothrombin Time: 15.5 seconds — ABNORMAL HIGH (ref 11.6–15.2)

## 2011-02-05 LAB — GLUCOSE, CAPILLARY: Glucose-Capillary: 102 mg/dL — ABNORMAL HIGH (ref 70–99)

## 2011-02-05 NOTE — Op Note (Signed)
  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  POSTOPERATIVE DIAGNOSIS:  Rectal cancer with no liver lesions seen on ultrasound.  PROCEDURE:  Intraoperative ultrasound of the liver and right kidney.  SURGEON:  Almond Lint, MD  ANESTHESIA:  General.  ESTIMATED BLOOD LOSS:  Minimal.  COMPLICATIONS:  None known.  FINDINGS:  The liver was scanned in its entirety along with the right kidney with no evidence of intrahepatic lesions.  There is good patency of the vessels in all lobes.  PROCEDURE IN DETAILS:  Ms. Dibiasio was in operating room on the operating table and has had lower anterior resection performed by Dr. Abbey Chatters and assisted by myself.  At this point, the liver was ultrasounded to evaluate better the potential lesions seen on CT scan.  The liver was scanned sequentially looking first at the porta and the right and left portal veins.  There were no lesions seen at this part of the liver. The right liver was then scanned from inferiorly to superiorly and then from lateral to medial.  There were no liver lesions seen in any of the lobes.  There were none up at the hepatic veins.  The left liver was scanned as well with no lesions seen. The patient tolerated the procedure well and was released back to Dr. Abbey Chatters for closure of the abdomen     Almond Lint, MD     FB/MEDQ  D:  01/31/2011  T:  02/01/2011  Job:  [id number]  Electronically Signed by Almond Lint MD on 02/05/2011 09:51:45 PM

## 2011-02-07 ENCOUNTER — Encounter: Payer: Self-pay | Admitting: Internal Medicine

## 2011-02-11 NOTE — Op Note (Signed)
[name], [name]                ACCOUNT NO.:  [id number]  MEDICAL RECORD NO.:  [id number]           PATIENT TYPE:  I  LOCATION:  5124                         FACILITY:  [facility]  PHYSICIAN:  Adolph Pollack, M.D.DATE OF BIRTH:  10-26-1965  DATE OF PROCEDURE:  01/31/2011 DATE OF DISCHARGE:                              OPERATIVE REPORT   PREOPERATIVE DIAGNOSIS:  Rectosigmoid cancer.  POSTOPERATIVE DIAGNOSIS:  Rectal cancer.  PROCEDURES: 1. Low anterior resection of rectum. 2. Intraoperative hepatic ultrasound (by Dr. Donell Beers).  SURGEON:  Adolph Pollack, MD  ASSISTANT:  Almond Lint, MD  ANESTHESIA:  General.  INDICATIONS:  This is a 45 year old female who approximately a month ago was diagnosed with a pulmonary embolism, but unknown etiology.  Upon starting anticoagulation, she began having rectal bleeding.  Colonoscopy demonstrated a rectosigmoid lesion that was biopsied and positive for cancer.  A CT scan was performed demonstrating 4 lesions in the liver as well, but no other potential metastatic disease.  She now presents for the above procedure.  Procedure and risks were discussed with her preoperative.  TECHNIQUE:  She was brought to the operating room and placed supine on the operating table and general anesthetic was administered.  She was placed in the lithotomy position.  A Foley catheter and orogastric tube were inserted.  The abdominal wall and perianal area were then sterilely prepped and draped.  Beginning above the umbilicus, I made a midline incision down to the pelvic area.  I divided the skin, subcutaneous tissue, fascia, and peritoneum entering the peritoneal cavity.  Once again, the peritoneal cavity was explored.  I palpated the liver and looked at the liver and I could not feel any lesions in the right lobe as was described in the CT scan.  The left lobe was without lesion as well.  No small bowel lesions were noted.  No metastasis or nodules  in the pelvis or peritoneal lining.  Following this, I was able to palpate the tumor and it was basically in the rectum below the peritoneal reflection.  I chose a point at the descending and proximal sigmoid colon area which stretched down into the pelvis and using the GIA stapler I divided this.  I then mobilized the colon from its lateral attachments using electrocautery.  The left and right ureters were both identified and traced down to the pelvis and avoided.  I then divided the mesentery of the colon directly posteriorly using the LigaSure and then ligated the anterior mesenteric artery and vein.  I continued my posterior mobilization using a LigaSure down into the sacral hollow and up toward the coccyx.  I then began lateral mobilization and incised in the peritoneal attachments with cautery and dividing the vessels with the LigaSure.  Anteriorly, I made an incision in the rectovaginal plane and used blunt dissection to separate the vagina from the anterior rectum.  I was well below the tumor posteriorly, but not at that level anteriorly.  I continued to dissect tissue anteriorly until I was well below the tumor.  I then did a dissection of the perirectal fat using blunt dissection and  a LigaSure.  Following this, Dr. Donell Beers performed a rigid proctosigmoidoscopy and I could feel the tip of the scope.  This tip of the scope marked the distal portion of the mass and I marked this with a suture.  I then brought the noncutting articulating stapler into the field and then divided the rectum at least 2 cm below the tumor with the reticulating stapler.  I verified that the margins were free grossly and sent this specimen to Pathology.  Following this, I then brought up the descending colon/descending colon stump and excised the staple line.  I placed a #29 EEA anvil and then resealed the rectal stump with the GIA stapler.  I then brought the tip of the anvil out to the side in the  Baker-type fashion and put a purse- string suture of 2-0 Prolene around it.  The handle was then introduced through the rectum and stapled and rectum to side descending colon anastomosis was performed with the EEA circular stapler.  After this was performed, the staple line was inspected and was intact.  I then occluded the colon proximal to the anastomosis and the pelvis was filled with water.  Air was insufflated until the staple line was taunt and no leak was noted.  The air was released.  Following this, gloves were changed and copious irrigation was performed and hemostasis was adequate.  I then made a stab incision in the left lower quadrant and placed a #19 Blake drain into the pelvis and then exited out.  The stab incision was anchored to the skin with 3-0 nylon suture.  Following this, Dr. Donell Beers then performed an intraoperative ultrasound which was in a separate note, but she did not see any of the lesions described on CT scan and we could not palpate any lesions.  Following this, I then put the omentum over the small bowel and then closed the fascia with running #1 PDS suture.  The subcutaneous tissue was irrigated and skin was closed with staples.  Sterile dressings were applied.  Needle, sponge, and instrument counts were reportedly to be correct before closure.  She tolerated the procedure well without any apparent complications and was taken to the recovery room in satisfactory condition.     Adolph Pollack, M.D.     Kari Baars  D:  01/31/2011  T:  02/01/2011  Job:  [id number]  cc:   [name], MD, Luiz Ochoa, MD Merlene Laughter [name], M.D.  Electronically Signed by Avel Peace M.D. on 02/11/2011 02:17:44 PM

## 2011-02-12 ENCOUNTER — Encounter: Payer: 59 | Admitting: Hematology and Oncology

## 2011-02-15 ENCOUNTER — Other Ambulatory Visit: Payer: Self-pay | Admitting: Hematology and Oncology

## 2011-02-15 ENCOUNTER — Encounter (HOSPITAL_BASED_OUTPATIENT_CLINIC_OR_DEPARTMENT_OTHER): Payer: 59 | Admitting: Hematology and Oncology

## 2011-02-15 DIAGNOSIS — C20 Malignant neoplasm of rectum: Secondary | ICD-10-CM

## 2011-02-15 DIAGNOSIS — I2699 Other pulmonary embolism without acute cor pulmonale: Secondary | ICD-10-CM

## 2011-02-15 DIAGNOSIS — K7689 Other specified diseases of liver: Secondary | ICD-10-CM

## 2011-02-15 DIAGNOSIS — Z7901 Long term (current) use of anticoagulants: Secondary | ICD-10-CM

## 2011-02-15 LAB — CBC WITH DIFFERENTIAL/PLATELET
EOS%: 1.7 % (ref 0.0–7.0)
Eosinophils Absolute: 0.1 10*3/uL (ref 0.0–0.5)
LYMPH%: 38.4 % (ref 14.0–49.7)
MCH: 29.4 pg (ref 25.1–34.0)
MCV: 89 fL (ref 79.5–101.0)
MONO%: 5.5 % (ref 0.0–14.0)
NEUT#: 1.9 10*3/uL (ref 1.5–6.5)
Platelets: 300 10*3/uL (ref 145–400)
RBC: 4.18 10*6/uL (ref 3.70–5.45)
nRBC: 0 % (ref 0–0)

## 2011-02-15 LAB — COMPREHENSIVE METABOLIC PANEL
ALT: 28 U/L (ref 0–35)
CO2: 27 mEq/L (ref 19–32)
Creatinine, Ser: 0.84 mg/dL (ref 0.40–1.20)
Glucose, Bld: 97 mg/dL (ref 70–99)
Total Bilirubin: 0.6 mg/dL (ref 0.3–1.2)

## 2011-02-15 LAB — CEA: CEA: 1.1 ng/mL (ref 0.0–5.0)

## 2011-02-15 LAB — PROTIME-INR: Protime: 30 Seconds — ABNORMAL HIGH (ref 10.6–13.4)

## 2011-02-19 ENCOUNTER — Other Ambulatory Visit (HOSPITAL_COMMUNITY): Payer: Self-pay | Admitting: General Surgery

## 2011-02-19 ENCOUNTER — Ambulatory Visit (HOSPITAL_COMMUNITY)
Admission: RE | Admit: 2011-02-19 | Discharge: 2011-02-19 | Disposition: A | Discharge status: Home / Self Care | Payer: 59 | Source: Ambulatory Visit | Attending: General Surgery | Admitting: General Surgery

## 2011-02-19 ENCOUNTER — Encounter (HOSPITAL_COMMUNITY)
Admission: RE | Admit: 2011-02-19 | Discharge: 2011-02-19 | Disposition: A | Discharge status: Home / Self Care | Payer: 59 | Source: Ambulatory Visit | Attending: General Surgery | Admitting: General Surgery

## 2011-02-19 DIAGNOSIS — Z01811 Encounter for preprocedural respiratory examination: Secondary | ICD-10-CM | POA: Insufficient documentation

## 2011-02-19 DIAGNOSIS — Z01812 Encounter for preprocedural laboratory examination: Secondary | ICD-10-CM | POA: Insufficient documentation

## 2011-02-19 DIAGNOSIS — C19 Malignant neoplasm of rectosigmoid junction: Secondary | ICD-10-CM | POA: Insufficient documentation

## 2011-02-19 DIAGNOSIS — Z01818 Encounter for other preprocedural examination: Secondary | ICD-10-CM | POA: Insufficient documentation

## 2011-02-19 LAB — BASIC METABOLIC PANEL
CO2: 27 mEq/L (ref 19–32)
Glucose, Bld: 133 mg/dL — ABNORMAL HIGH (ref 70–99)
Potassium: 3.7 mEq/L (ref 3.5–5.1)
Sodium: 140 mEq/L (ref 135–145)

## 2011-02-19 LAB — DIFFERENTIAL
Eosinophils Relative: 2 % (ref 0–5)
Lymphocytes Relative: 40 % (ref 12–46)
Lymphs Abs: 1.3 10*3/uL (ref 0.7–4.0)
Monocytes Absolute: 0.3 10*3/uL (ref 0.1–1.0)

## 2011-02-19 LAB — CBC
HCT: 35.3 % — ABNORMAL LOW (ref 36.0–46.0)
MCH: 30 pg (ref 26.0–34.0)
MCHC: 33.1 g/dL (ref 30.0–36.0)
MCV: 90.5 fL (ref 78.0–100.0)
RDW: 13 % (ref 11.5–15.5)

## 2011-02-21 ENCOUNTER — Ambulatory Visit (HOSPITAL_COMMUNITY): Payer: 59

## 2011-02-21 ENCOUNTER — Ambulatory Visit (HOSPITAL_COMMUNITY)
Admission: RE | Admit: 2011-02-21 | Discharge: 2011-02-21 | Disposition: A | Discharge status: Home / Self Care | Payer: 59 | Source: Ambulatory Visit | Attending: General Surgery | Admitting: General Surgery

## 2011-02-21 DIAGNOSIS — Z01818 Encounter for other preprocedural examination: Secondary | ICD-10-CM | POA: Insufficient documentation

## 2011-02-21 DIAGNOSIS — C189 Malignant neoplasm of colon, unspecified: Secondary | ICD-10-CM | POA: Insufficient documentation

## 2011-02-21 DIAGNOSIS — I824Z9 Acute embolism and thrombosis of unspecified deep veins of unspecified distal lower extremity: Secondary | ICD-10-CM

## 2011-02-21 DIAGNOSIS — T81718A Complication of other artery following a procedure, not elsewhere classified, initial encounter: Secondary | ICD-10-CM

## 2011-02-21 DIAGNOSIS — I2699 Other pulmonary embolism without acute cor pulmonale: Secondary | ICD-10-CM

## 2011-02-21 DIAGNOSIS — Z01812 Encounter for preprocedural laboratory examination: Secondary | ICD-10-CM | POA: Insufficient documentation

## 2011-02-21 DIAGNOSIS — Z7901 Long term (current) use of anticoagulants: Secondary | ICD-10-CM | POA: Insufficient documentation

## 2011-02-21 DIAGNOSIS — E119 Type 2 diabetes mellitus without complications: Secondary | ICD-10-CM | POA: Insufficient documentation

## 2011-02-21 HISTORY — PX: PORTACATH PLACEMENT: SHX2246

## 2011-02-21 LAB — GLUCOSE, CAPILLARY
Glucose-Capillary: 108 mg/dL — ABNORMAL HIGH (ref 70–99)
Glucose-Capillary: 93 mg/dL (ref 70–99)

## 2011-02-21 NOTE — Discharge Summary (Signed)
  [name], [name]                ACCOUNT NO.:  [id number]  MEDICAL RECORD NO.:  [id number]           PATIENT TYPE:  I  LOCATION:  5124                         FACILITY:  [facility]  PHYSICIAN:  Adolph Pollack, M.D.DATE OF BIRTH:  [birth year]  DATE OF ADMISSION:  01/31/2011 DATE OF DISCHARGE:  02/05/2011                              DISCHARGE SUMMARY   FINAL DIAGNOSIS:  T3, N1 rectal cancer.  SECONDARY DIAGNOSES: 1. History of recent pulmonary embolism. 2. Postoperative ileus. 3. Acute blood loss anemia.  PROCEDURE:  Low anterior resection of the rectum with intraoperative ultrasound on January 31, 2011.  REASON FOR ADMISSION:  [name] is a 45 year old female who a month prior to admission was found to have a pulmonary embolism of unknown etiology.  She was started on anticoagulation therapy, then began having rectal bleeding with bowel movements.  She underwent a colonoscopy demonstrating a lesion at the rectosigmoid area and this was biopsied and positive for adenocarcinoma.  A preoperative CT scan was concerning for potential lesions in the liver.  She has been switched over to Lovenox.  She did have a temporary inferior vena cava filter placed and she was admitted for the above procedure.  HOSPITAL COURSE:  She underwent the above procedure which she tolerated well.  She was started on Lovenox 24 hours after the procedure.  She had a Jackson-Pratt drain that was left in.  She was voiding well with normal renal function on her first postoperative day.  IV heparin was started on her first postoperative day as well and monitored.  She was started on just some clear liquids until her ileus improved and make sure her diet was advanced and she started on Coumadin as well.  By 5th postoperative day, she had good bowel function, tolerating a diet.  Her incision was clean and intact and she is able to be discharged.  DISPOSITION:  Discharged home in satisfactory addition on  February 05, 2011.  We will have her come back in the office in the next 3-5 days for staple removal.  She is going to go home on a Lovenox and Coumadin as well as Percocet for pain.  We will refer her for an outpatient medical oncology consultation as well.     Adolph Pollack, M.D.     Kari Baars  D:  02/20/2011  T:  02/20/2011  Job:  [id number]  cc:   [name], MD, Trey Sailors R. [name], M.D. Kalman Shan, MD Laurice Record, M.D.  Electronically Signed by Avel Peace M.D. on 02/21/2011 10:02:13 AM

## 2011-02-22 ENCOUNTER — Other Ambulatory Visit: Payer: Self-pay | Admitting: Internal Medicine

## 2011-02-22 ENCOUNTER — Other Ambulatory Visit (HOSPITAL_COMMUNITY): Payer: 59

## 2011-02-22 ENCOUNTER — Encounter (HOSPITAL_COMMUNITY): Payer: Self-pay

## 2011-02-22 ENCOUNTER — Encounter (HOSPITAL_COMMUNITY)
Admission: RE | Admit: 2011-02-22 | Discharge: 2011-02-22 | Disposition: A | Discharge status: Home / Self Care | Payer: 59 | Source: Ambulatory Visit | Attending: Hematology and Oncology | Admitting: Hematology and Oncology

## 2011-02-22 DIAGNOSIS — C801 Malignant (primary) neoplasm, unspecified: Secondary | ICD-10-CM | POA: Insufficient documentation

## 2011-02-22 DIAGNOSIS — C20 Malignant neoplasm of rectum: Secondary | ICD-10-CM

## 2011-02-22 HISTORY — PX: INSERTION OF VENA CAVA FILTER: SHX5871

## 2011-02-22 LAB — GLUCOSE, CAPILLARY: Glucose-Capillary: 156 mg/dL — ABNORMAL HIGH (ref 70–99)

## 2011-02-22 MED ORDER — FLUDEOXYGLUCOSE F - 18 (FDG) INJECTION
19.2000 | Freq: Once | INTRAVENOUS | Status: AC | PRN
  Administered 2011-02-22: 19.2 via INTRAVENOUS

## 2011-02-25 ENCOUNTER — Telehealth: Payer: Self-pay | Admitting: Internal Medicine

## 2011-02-25 NOTE — Telephone Encounter (Signed)
Per pt, she will need 1 refill on her lovenox until next ov at the coumadin clinic. Pls advise if okay for refill.

## 2011-02-25 NOTE — Op Note (Signed)
**Note [name] via Obfuscation** NAMESTEFANNY, [name]                ACCOUNT NO.:  [id number]  MEDICAL RECORD NO.:  [id number]           PATIENT TYPE:  O  LOCATION:  SDSC                         FACILITY:  [facility]  PHYSICIAN:  Fransisco Hertz, MD       DATE OF BIRTH:  May 08, 1966  DATE OF PROCEDURE:  02/21/2011 DATE OF DISCHARGE:  02/21/2011                              OPERATIVE REPORT   PROCEDURES: 1. Right femoral artery cannulation under ultrasound guidance. 2. Inferior venacavogram.  PREOPERATIVE DIAGNOSIS:  Status post inferior vena caval filter placed for pulmonary embolism.  POSTOPERATIVE DIAGNOSES: 1. Status post inferior vena caval filter placed for pulmonary     embolism. 2. Intra-filter thrombus.  SURGEON:  Fransisco Hertz, MD  ANESTHESIA:  Conscious sedation.  ESTIMATED BLOOD LOSS:  Minimal.  CONTRAST:  90 mL.  FINDINGS:  In this case included a thrombus present within the inferior vena caval filter that was verified on additional oblique views.  INDICATIONS:  This is a 45 year old patient that had a pulmonary embolism.  She, as part of her workup for hypercoagulability, had a CT scan which demonstrated a colon mass.  She recently underwent a low anterior resection.  The inferior vena caval filter was placed prior to that to allow her to safely undergo the operation without anticoagulation.  At this point, the patient resumed full anticoagulation in the form of b.i.d. Lovenox.  She presents here today for possible removal of this inferior vena cava filter.  I had explained to her preoperatively the risks of this procedure and that I routinely perform a inferior vena caval injection to verify patency of the filter and whether or not there were any presence of clot within the filter.  I do not routinely remove the filter if there is still clot within it. She is aware of the risks of this procedure which include bleeding, infection, possible inability to remove the filter, possible rupture of the  inferior vena cava, and possible need for additional procedure.  She is aware of such and agreed to proceed forward.  DESCRIPTION OF OPERATION:  After full informed written consent had been obtained from the patient, she was brought back to the angio suite and placed supine upon the angio table.  She was given conscious sedation, amounts of which are listed after connecting her to monitoring equipment.  She was then prepped and draped in the standard fashion for a inferior venacavogram.  I  turned my attention to right groin.  Under ultrasound guidance, the right femoral vein was cannulated under ultrasound guidance.  I passed a J-wire up into her inferior vena cava and then I placed a 6-French sheath.  I carefully advanced the sheath, so that it was about 5-cm distal to the inferior vena cava filter.  The filter appeared to be in good position, centered within the inferior vena cava.  It appeared to be adherent.  At this point, then the sheath was connected to power injector circuit after de-airing and declotting. The power injector findings are as listed above.  On the first injection, there was what appeared to be adherent but mobile  clot on the lateral surface of this inferior vena cava that was trapped by the inferior vena caval filter.  I verified this by doing a steep RAO and also LAO ejection, this verified the presence of some clot on the side wall of the inferior vena caval filter, so I felt that at this point the patient was catching clot in her filter on full anticoagulation with Lovenox.  In fact embolization while on full anticoagulation is an absolute indication for a filter placement, so it made no sense to remove this filter.  Unfortunately, based on manufacturer's specifications, this filter will likely become a permanent filter as the ability to retrieve these filters after a month is greatly decreased.  I had discussed this with the patient beforehand and she is aware  of such.  At this point, we removed the sheath and held pressure for about 5 minutes.  There was no active bleeding from this cannulation site.  COMPLICATIONS:  None.  CONDITION:  Stable.     Fransisco Hertz, MD     BLC/MEDQ  D:  02/21/2011  T:  02/22/2011  Job:  [id number]  Electronically Signed by Leonides Sake MD on 02/25/2011 11:23:47 AM

## 2011-02-26 NOTE — Telephone Encounter (Signed)
Thanks. Please ensure fu for completion sake

## 2011-02-26 NOTE — Telephone Encounter (Signed)
Called, spoke with pt to remind her of pending appt with MR on 03/27/11.  Pt states she was supposed to have filter taken out on 5/3 but they could not do it bc there was a clot in it.  States she was told to schedule f/u with MR but would like to know if he would like to see her before June bc of this.  Note: First available is not until 5/25.    MR, pls advise if June appt is ok.  Thanks!

## 2011-02-26 NOTE — Telephone Encounter (Signed)
Called, spoke with pt.  States she did get the lovenox called in by Dr. Maurice March.  States she does not need a refill now from MR.  Has an appt on 03/01/11 at the Coumadin Clinic at the [facility] to see if she still needs to be on this.  Nothing further needed from our office at this time.  Will forward message to MR so he is aware.

## 2011-02-26 NOTE — Telephone Encounter (Signed)
Called, spoke with pt.  She is aware MR ok with June appt but she can move up if she would like.  She would like to stay with the June 6 appt for now.  Aware MR will discuss filter at this time as well.  She verbalized understanding of this.

## 2011-02-26 NOTE — Telephone Encounter (Signed)
June appt is fine but if she wants to see me 5/25 that is fine. Dr Abbey Chatters said that filter was full of clot and tough to remove; so by email we felt it probably cannot be removed but we can review this at fu

## 2011-02-26 NOTE — Telephone Encounter (Signed)
I got message from Dr. Maurice March in the box that he called her lovenox - did she get that or not ? Is this for refill apart from Dr. Bonnetta Barry call in ? Also, before her surgery for colon cancer she was following with her PMD for coumadin check. Now, what is her plan ? Does she know. Has she been told that ? Is she going to do with PMD? Or oncology Dr. Dalene Carrow ? Or [facility] ?

## 2011-02-26 NOTE — Assessment & Plan Note (Signed)
[facility]                                ON-CALL NOTE  NAME:[name], [name]                       MRN:          [id number] DATE:02/25/2011                            DOB:          08-23-1966   PULMONARY CARE PROVIDER:  Kalman Shan, MD  Ms. Tapanes called the on-call number stating that she had no prescribed Lovenox to bridge her to when her Coumadin dose is going to be reassessed for its effectiveness.  She is due to be seen in the Cancer Clinic on Mar 01, 2011.  It is unclear who was to prescribe this, but Dr. Marchelle Gearing was familiar with the case having discussed it with several of the surgeons who performed a procedure on her last week.  He agreed to prescribe the Lovenox and I called it into the [facility], Lovenox 95 mg b.i.d. subcu 8 doses until seen in the Cancer Clinic.  She will call if there are any issues.    Rogelia Rohrer, MD Electronically Signed   Clemetine Marker  DD: 02/25/2011  DT: 02/26/2011  Job #: [id number]

## 2011-02-27 ENCOUNTER — Ambulatory Visit: Payer: 59 | Admitting: Adult Health

## 2011-02-27 ENCOUNTER — Ambulatory Visit: Payer: 59 | Attending: Radiation Oncology | Admitting: Radiation Oncology

## 2011-02-27 DIAGNOSIS — R197 Diarrhea, unspecified: Secondary | ICD-10-CM | POA: Insufficient documentation

## 2011-02-27 DIAGNOSIS — C20 Malignant neoplasm of rectum: Secondary | ICD-10-CM | POA: Insufficient documentation

## 2011-02-27 DIAGNOSIS — E119 Type 2 diabetes mellitus without complications: Secondary | ICD-10-CM | POA: Insufficient documentation

## 2011-02-27 DIAGNOSIS — Z7901 Long term (current) use of anticoagulants: Secondary | ICD-10-CM | POA: Insufficient documentation

## 2011-02-27 DIAGNOSIS — R110 Nausea: Secondary | ICD-10-CM | POA: Insufficient documentation

## 2011-02-27 DIAGNOSIS — Z510 Encounter for antineoplastic radiation therapy: Secondary | ICD-10-CM | POA: Insufficient documentation

## 2011-02-27 DIAGNOSIS — R109 Unspecified abdominal pain: Secondary | ICD-10-CM | POA: Insufficient documentation

## 2011-02-27 DIAGNOSIS — L538 Other specified erythematous conditions: Secondary | ICD-10-CM | POA: Insufficient documentation

## 2011-02-27 DIAGNOSIS — Z86718 Personal history of other venous thrombosis and embolism: Secondary | ICD-10-CM | POA: Insufficient documentation

## 2011-02-27 DIAGNOSIS — Z79899 Other long term (current) drug therapy: Secondary | ICD-10-CM | POA: Insufficient documentation

## 2011-02-27 NOTE — Op Note (Signed)
NAMESYLVAN, LAHM                ACCOUNT NO.:  [id number]  MEDICAL RECORD NO.:  [id number]           PATIENT TYPE:  O  LOCATION:  SDSC                         FACILITY:  [facility]  PHYSICIAN:  Adolph Pollack, M.D.DATE OF BIRTH:  1966-01-05  DATE OF PROCEDURE:  02/21/2011 DATE OF DISCHARGE:  02/21/2011                              OPERATIVE REPORT   PREOPERATIVE DIAGNOSIS:  Rectal cancer.  POSTOPERATIVE DIAGNOSIS:  Rectal cancer.  PROCEDURE:  Ultrasound-guided Port-A-Cath insertion into right internal jugular vein with fluoroscopy (8-French PowerPort).  SURGEON:  Adolph Pollack, MD  ANESTHESIA:  Local (Xylocaine) with MAC.  INDICATIONS:  Ms. Spellman is a 45 year old female with a stage III rectal cancer.  She needs long-term venous access for chemotherapy.  Port-A- Cath is planned.  The procedure risks and aftercare were discussed with her preoperatively.  TECHNIQUE:  She was brought to the operating room and placed supine on the operating table, given intravenous sedation.  A roll was placed under her shoulders.  I using the ultrasound identified the right internal jugular vein.  Following this, the neck and upper chest were sterilely prepped and draped.  Once again, using the ultrasound and a sterile drape, I identified the right internal jugular vein and injected local anesthetic in the skin and subcutaneous tissue anterior to it.  Using a 16 gauge needle, under ultrasound guidance, I cannulated the right internal jugular vein.  A wire was then passed into the internal jugular vein and I could see this also with the ultrasound.  Its position in the superior vena cava was verified by fluoroscopy.  Following this, I made a larger incision around the wire.  I then anesthetized an area in the right upper chest wall and infraclavicular region with a local anesthetic superficially and deep.  An incision was made in the right upper chest wall and using electrocautery I  created a pocket for the port.  I then anesthetized the subcutaneous tissue between the neck and chest wall incision.  I then tunneled the catheter from the inferior up to the superior incision.  A dilator introducer complex was then placed over the wire and dilator and wire were removed and the catheter was threaded through the peel-away sheath introducer. The peel-away sheath introducer was then removed.  Under fluoroscopic guidance, I pulled the tip of the catheter back.  It was in the distal superior vena cava.  I then cut the catheter and attached to the port.  The port easily aspirated blood and flushed.  I anchored the port to the chest wall with interrupted 2-0 Vicryl sutures. Concentrated solution was then placed in the port.  I then closed the neck skin incision with 4-0 Monocryl subcuticular stitch.  The port site incision was closed in 2 layers.  The subcutaneous tissue was approximated with running 3-0 Vicryl suture. The skin was closed with 4-0 Monocryl subcuticular stitch.  Steri-Strips and sterile dressings were applied.  She tolerated the procedure well without any apparent complications and was taken to recovery in satisfactory condition where a stat portable chest x-ray is pending.     Adolph Pollack,  M.D.     TJR/MEDQ  D:  02/21/2011  T:  02/22/2011  Job:  [id number]  Electronically Signed by Avel Peace M.D. on 02/27/2011 10:40:17 AM

## 2011-03-01 ENCOUNTER — Encounter (HOSPITAL_BASED_OUTPATIENT_CLINIC_OR_DEPARTMENT_OTHER): Payer: 59 | Admitting: Hematology and Oncology

## 2011-03-01 ENCOUNTER — Other Ambulatory Visit: Payer: Self-pay | Admitting: Hematology and Oncology

## 2011-03-01 DIAGNOSIS — C20 Malignant neoplasm of rectum: Secondary | ICD-10-CM

## 2011-03-01 DIAGNOSIS — Z7901 Long term (current) use of anticoagulants: Secondary | ICD-10-CM

## 2011-03-01 DIAGNOSIS — Z86718 Personal history of other venous thrombosis and embolism: Secondary | ICD-10-CM

## 2011-03-01 LAB — PROTIME-INR: Protime: 15.6 Seconds — ABNORMAL HIGH (ref 10.6–13.4)

## 2011-03-05 ENCOUNTER — Encounter (HOSPITAL_BASED_OUTPATIENT_CLINIC_OR_DEPARTMENT_OTHER): Payer: 59 | Admitting: Hematology and Oncology

## 2011-03-05 ENCOUNTER — Other Ambulatory Visit: Payer: Self-pay | Admitting: Hematology and Oncology

## 2011-03-05 DIAGNOSIS — Z7901 Long term (current) use of anticoagulants: Secondary | ICD-10-CM

## 2011-03-05 DIAGNOSIS — C20 Malignant neoplasm of rectum: Secondary | ICD-10-CM

## 2011-03-05 DIAGNOSIS — Z5181 Encounter for therapeutic drug level monitoring: Secondary | ICD-10-CM

## 2011-03-05 LAB — CBC WITH DIFFERENTIAL/PLATELET
Eosinophils Absolute: 0.1 10*3/uL (ref 0.0–0.5)
LYMPH%: 52.7 % — ABNORMAL HIGH (ref 14.0–49.7)
MCV: 88.3 fL (ref 79.5–101.0)
MONO%: 8.6 % (ref 0.0–14.0)
NEUT#: 0.8 10*3/uL — ABNORMAL LOW (ref 1.5–6.5)
NEUT%: 35.9 % — ABNORMAL LOW (ref 38.4–76.8)
Platelets: 214 10*3/uL (ref 145–400)
RBC: 3.77 10*6/uL (ref 3.70–5.45)
nRBC: 0 % (ref 0–0)

## 2011-03-08 NOTE — H&P (Signed)
NAME:  [name], [name]                ACCOUNT NO.:  [id number]   MEDICAL RECORD NO.:  [id number]          PATIENT TYPE:  INP   LOCATION:                                FACILITY:  [facility]   PHYSICIAN:  [name], M.D.DATE OF BIRTH:  04/25/1966   DATE OF ADMISSION:  07/24/2004  DATE OF DISCHARGE:                                HISTORY & PHYSICAL   DATE OF ADMISSION:  July 24, 2004   CHIEF COMPLAINT:  Term pregnancy, gestational diabetes - insulin requiring.   HISTORY OF PRESENT ILLNESS:  A 45 year old G26 P2 female at [redacted] weeks  gestation with history of insulin-dependent diabetes for induction.  Prenatal course has been uncomplicated with sugars noted to be in good  control and reassuring antepartum testing.  She is beta strep negative.  For  the remainder of her history, see her [facility].   PHYSICAL EXAMINATION:  HEENT:  Normal.  LUNGS:  Clear.  CARDIAC:  Regular rate.  No rubs, murmurs, or gallops.  ABDOMEN:  Gravid, vertex fetus consistent with term.  Positive fetal heart  tones.  PELVIC:  Fingertip, 50% effaced, -2 station, vertex presentation.   ASSESSMENT AND PLAN:  A 44 year old gravida 6 para 2 female, history of two  prior deliveries - one 9 pounds 3 ounces, one  9 pounds 6 ounces.  Most  recent evaluation shows fetus to be approximately 4000 g, approximately 8  pounds 12 ounces.  For induction.  The risks, benefits, indications, and  alternatives for the induction were reviewed with the patient.  She is  comfortable with this and is admitted for Cervidil in the p.m., Pitocin in  the a.m. induction.  The patient is beta strep negative.       ___________________________________________  Nadyne Coombes. Audie Box, M.D.    TPF/MEDQ  D:  07/24/2004  T:  07/24/2004  Job:  [id number]

## 2011-03-08 NOTE — Assessment & Plan Note (Signed)
[facility]                              CARDIOLOGY OFFICE NOTE   NAME:[name], [name]                         MRN:          [id number]  DATE:                                      DOB:          1966/02/13   [name] comes in today for some chest discomfort she describes as chest  pressure that goes up into her neck. There are no associated symptoms. She  has had some dyspnea on exertion. Most of her chest discomfort occurs under  stressful situations.   We saw her on December 18, 2004 for ruling out HOCM with her father dying of  sudden cardiac death and autopsy showing severe concentric LVH. Her 2D  echocardiogram was essentially normal.   She is on no cardiac meds.   PHYSICAL EXAMINATION:  VITAL SIGNS:  Blood pressure today is 132/86, pulse  76 and regular, weight 214.  HEENT:  PERRLA, extraocular movements intact, sclerae clear. Facial  asymmetry is normal with dentition satisfactory.  NECK:  Carotids are full without bruits. There is no JVD. Thyroid is not  enlarged. Trachea is midline.  LUNGS:  Clear.  HEART:  Reveals normal S1, S2 without murmur, rub or gallop.  ABDOMEN:  Soft, good bowel sounds.  EXTREMITIES:  With no cyanosis, clubbing or edema. Pulses are brisk.   EKG is normal.   ASSESSMENT:  1. Chest pressure mostly with mental stress. However, she has dyspnea on      exertion. There is a family history of sudden cardiac death. She does      not have hypertrophic obstructive cardiomyopathy, however.  2. Progressive weight gain.  3. Sedentary lifestyle secondary to no time with children and job.   RECOMMENDATIONS:  1. Exercise rest/stress Myoview.  2. Cardio activity three hours a week.  3. Weight reduction.                               Thomas C. Daleen Squibb, MD, [facility]   TCW/MedQ  DD:  07/15/2006 DT:  07/17/2006 Job #:  [id number]   cc:   Merlene Laughter. [name], M.D.  [name], M.D.

## 2011-03-08 NOTE — Op Note (Signed)
NAMEBRYONY, [name]                ACCOUNT NO.:  [id number]   MEDICAL RECORD NO.:  [id number]          PATIENT TYPE:  INP   LOCATION:  9112                          FACILITY:  [facility]   PHYSICIAN:  [name], M.D.DATE OF BIRTH:  01-13-1966   DATE OF PROCEDURE:  07/25/2004  DATE OF DISCHARGE:                                 OPERATIVE REPORT   PREOPERATIVE DIAGNOSES:  1.  Term pregnancy.  2.  Gestational diabetes, insulin-dependent.  3.  Non-reassuring fetal tracing with positive contraction stress test.   POSTOPERATIVE DIAGNOSES:  1.  Term pregnancy.  2.  Gestational diabetes, insulin-dependent.  3.  Non-reassuring fetal tracing with positive contraction stress test.   PROCEDURE:  Primary low transverse cervical cesarean section.   SURGEON:  [name], M.D.   ASSISTANT:  Scrub technician.   ANESTHETIC:  Spinal.   ESTIMATED BLOOD LOSS:  Less than 500 mL.   COMPLICATIONS:  None.   SPECIMEN:  Cord blood, cord pH, placenta.   FINDINGS:  At 38, normal female, Apgars 7 and 9, weight 9 pounds 10 ounces,  pH 7.30.  Pelvic anatomy noted to be normal to limited inspection, noting  uterus was not exteriorized.   PROCEDURE:  The patient was taken to the operating room, underwent spinal  anesthesia, was placed in a left-tilt supine position, received an abdominal  preparation with Betadine solution, Foley catheter placed to sterile  technique and the patient was draped in the usual fashion.  After assuring  adequate anesthesia, the abdomen was sharply entered through a Pfannenstiel  incision, achieving adequate hemostasis at all levels.  The bladder flap was  sharply and bluntly developed without difficulty, uterus sharply entered in  the lower uterine segment and bluntly extended laterally.  The membranes  were ruptured and the fetal vertex was found to be in a floating position.  Initially, a vacuum extractor was placed on the vertex, but this was felt to  be  inadequate to deliver the fetus and subsequently, Simpson forceps were  applied and the vertex was delivered.  The nares and mouth were suctioned,  the rest of the infant delivered, the cord doubly clamped and cut, and the  infant was handed to pediatrics in attendance.  Samples of cord bloods were  obtained, a cord pH obtained and the placenta was then spontaneously  extruded, noted to be intact.  Due to the bulk of the uterus, it was unable  to be exteriorized and the endometrial cavity was explored with a sponge to  remove all placental membrane fragments.  The patient received 1 g Mefoxin  antibiotic prophylaxis at this time.  The uterine incision was then closed  in 1 layer using 0 Vicryl in a running-interlocking stitch.  Several figure-  of-eight 0 Vicryl sutures were subsequently placed to achieve ultimate  hemostasis.  The adnexa were palpated, no gross pathology noted, but again,  visualization was not accomplished.  The pelvis was copiously irrigated,  adequate hemostasis visualized, the fascia reapproximated with 0 Vicryl  suture in a running stitch, subcutaneous tissues irrigated, adequate  hemostasis achieved with electrocautery,  the skin reapproximated with 4-0  Vicryl in a running subcuticular stitch, Steri-Strips and Benzoin applied,  sterile dressing applied, patient taken to recovery room in good condition,  having tolerated her procedure well.      TPF/MEDQ  D:  07/25/2004  T:  07/25/2004  Job:  [id number]

## 2011-03-08 NOTE — Discharge Summary (Signed)
NAMEALICHIA, [name]                ACCOUNT NO.:  [id number]   MEDICAL RECORD NO.:  [id number]          PATIENT TYPE:  INP   LOCATION:  9112                          FACILITY:  [facility]   PHYSICIAN:  [name], M.D.DATE OF BIRTH:  06/20/1966   DATE OF ADMISSION:  07/24/2004  DATE OF DISCHARGE:  07/28/2004                                 DISCHARGE SUMMARY   DISCHARGE DIAGNOSES:  1.  Pregnancy at term.  2.  Gestational diabetes, insulin dependent.  3.  Nonreassuring fetal tracing.   PROCEDURE:  Primary low transverse cervical cesarean section on July 25, 2004.  Pathology [phone]:  Placenta with meconium parenchymal infarct,  subchorionic thrombus, three-vessel cord.   HOSPITAL COURSE:  The patient was admitted for induction due to term  pregnancy with insulin-dependent diabetes.  The patient began with regular  contractions and developed the onset of repetitive late decelerations and  subsequently was taken for a primary low transverse cervical cesarean  section on July 25, 2004 producing a normal female, Apgars 7 and 9, weight 9  pounds 10 ounces, cord pH 7.30.  The patient's postpartum course was  uncomplicated and she was discharged on postpartum day #3, ambulating well,  tolerating a regular diet, with a hemoglobin of 9.4.  Blood type A positive,  rubella titer positive.  The patient received precautions, instructions, and  follow-up, to be see in the office in 6 weeks.  Received a prescription for  Percocet for pain.     Timo   TPF/MEDQ  D:  08/14/2004  T:  08/14/2004  Job:  [id number]

## 2011-03-14 ENCOUNTER — Encounter (HOSPITAL_BASED_OUTPATIENT_CLINIC_OR_DEPARTMENT_OTHER): Payer: 59 | Admitting: Hematology and Oncology

## 2011-03-14 ENCOUNTER — Other Ambulatory Visit: Payer: Self-pay | Admitting: Hematology and Oncology

## 2011-03-14 DIAGNOSIS — C20 Malignant neoplasm of rectum: Secondary | ICD-10-CM

## 2011-03-14 LAB — CBC WITH DIFFERENTIAL/PLATELET
BASO%: 0.7 % (ref 0.0–2.0)
Eosinophils Absolute: 0 10*3/uL (ref 0.0–0.5)
MONO#: 0.2 10*3/uL (ref 0.1–0.9)
NEUT#: 1.5 10*3/uL (ref 1.5–6.5)
Platelets: 228 10*3/uL (ref 145–400)
RBC: 3.82 10*6/uL (ref 3.70–5.45)
RDW: 13 % (ref 11.2–14.5)
WBC: 2.9 10*3/uL — ABNORMAL LOW (ref 3.9–10.3)
lymph#: 1.2 10*3/uL (ref 0.9–3.3)
nRBC: 0 % (ref 0–0)

## 2011-03-14 LAB — BASIC METABOLIC PANEL
CO2: 23 mEq/L (ref 19–32)
Chloride: 107 mEq/L (ref 96–112)
Potassium: 4.1 mEq/L (ref 3.5–5.3)
Sodium: 140 mEq/L (ref 135–145)

## 2011-03-14 LAB — PROTIME-INR
INR: 2.8 (ref 2.00–3.50)
Protime: 33.6 Seconds — ABNORMAL HIGH (ref 10.6–13.4)

## 2011-03-19 ENCOUNTER — Encounter: Payer: 59 | Admitting: Hematology and Oncology

## 2011-03-26 ENCOUNTER — Encounter (HOSPITAL_BASED_OUTPATIENT_CLINIC_OR_DEPARTMENT_OTHER): Payer: 59 | Admitting: Hematology and Oncology

## 2011-03-26 ENCOUNTER — Other Ambulatory Visit: Payer: Self-pay | Admitting: Hematology and Oncology

## 2011-03-26 DIAGNOSIS — Z86718 Personal history of other venous thrombosis and embolism: Secondary | ICD-10-CM

## 2011-03-26 DIAGNOSIS — Z7901 Long term (current) use of anticoagulants: Secondary | ICD-10-CM

## 2011-03-26 DIAGNOSIS — C20 Malignant neoplasm of rectum: Secondary | ICD-10-CM

## 2011-03-26 LAB — PROTIME-INR

## 2011-03-26 LAB — CBC WITH DIFFERENTIAL/PLATELET
Basophils Absolute: 0 10*3/uL (ref 0.0–0.1)
EOS%: 1.7 % (ref 0.0–7.0)
Eosinophils Absolute: 0.1 10*3/uL (ref 0.0–0.5)
HCT: 36 % (ref 34.8–46.6)
HGB: 12.1 g/dL (ref 11.6–15.9)
MCH: 28.9 pg (ref 25.1–34.0)
MCV: 86.1 fL (ref 79.5–101.0)
MONO%: 6.1 % (ref 0.0–14.0)
NEUT#: 2.1 10*3/uL (ref 1.5–6.5)
NEUT%: 72 % (ref 38.4–76.8)

## 2011-03-27 ENCOUNTER — Ambulatory Visit: Payer: 59 | Admitting: Internal Medicine

## 2011-03-29 ENCOUNTER — Encounter: Payer: Self-pay | Admitting: Internal Medicine

## 2011-04-01 ENCOUNTER — Ambulatory Visit (INDEPENDENT_AMBULATORY_CARE_PROVIDER_SITE_OTHER): Payer: 59 | Admitting: Internal Medicine

## 2011-04-01 ENCOUNTER — Encounter: Payer: Self-pay | Admitting: Internal Medicine

## 2011-04-01 VITALS — BP 110/70 | HR 73 | Temp 98.1°F | Ht 71.0 in | Wt 192.4 lb

## 2011-04-01 DIAGNOSIS — I2699 Other pulmonary embolism without acute cor pulmonale: Secondary | ICD-10-CM

## 2011-04-01 NOTE — Patient Instructions (Signed)
Glad you are doing okay You should be on coumadin minimum 1 year or  till cancer is cured and catheter is out - whichever is longer I will see you back in March 2013, till then continue coumadin with Dr. Dalene Carrow monitoring coumadin The filter stays  When I see you back we can discuss about continued coumadin which you likely need

## 2011-04-01 NOTE — Progress Notes (Signed)
Subjective:    Patient ID: [name], female    DOB: 11/25/1965, 45 y.o.   MRN: [id number]  HPI  OV 01/21/2011: 45 year old overweight female who has a sedentary Sales promotion account executive job with [employer]. Never smoker. Had been on oral contraceptives since 2005.  Past 1 year - longer work hours (10-12 hour days). Since Jan 2012 noticed non specific bilateral lower extremity aches. Then, new diagnosis of diabetes end feb 2012 (exercise and diet regimen). Developed acute  rt lower chest and rt upper shoulder pleuritic pain on 12/21/2010 but became excruciating on 12/22/2010. Did not have associated dyspnea, hemoptysis, pedal edema In ER d-dimer high. CT chest showed bilateral lowe lobe PE but she was not hypoxemic. Had normal antithrombin III, protein C and protein S levels and negative facto 5 def  per dc summary. Denies increased sedentary levels or recent travel prior to PE. Rx with anticoagulation. Mammogram end 2010 normal per hx. Pap smear Jan 2012 normal per hx. Improved pain at discharge. However, still has some residual right sided chest discomfort that is mild, and brought on by lot of body movements. INR being tracked by PA of Dr. [name]. On Friday 01/18/2011 INR 3.3.  However, around 2 weeks ago noticed rectal bleeding while on coumadin Rx. Mixed with stools and notices it at end and when she wipes bottom. So saw Dr. Loreta Ave on 01/18/2011 - hgb unchanged per Dr. Loreta Ave verbal report. Denies straining or constipation, abdominal pain, nausea, vomit, diarrhea. Currently on lovenox shots for possible colonoscopy  OV 04/01/2011: Followup. Since last visit, diagnosed with COLON CANCER. Had resection by Dr. Abbey Chatters. No liver mets per Dr. Abbey Chatters converstation. Lymph node mets +. Now chemo and xrt by Dr. Dalene Carrow due to complete on Feb 2012. Has new rt Hickman. Had retrievable IVC filter at time of surgery but subseuqntly could not be removed due to clot burden. She is feeling fine now. Denies dyspnea, edema,  chest pain, hemoptysis. Feels well. Handling chemo/XRT well physically and emotionally. States family coping okay. Wants to know if IVC Filter can be removed and length of coumadin Rx. Coumadin monitoring by Dr. Dalene Carrow and INR most recently ws 3 per hx  Review of Systems  Constitutional: Negative for fever and unexpected weight change.  HENT: Negative for ear pain, nosebleeds, congestion, sore throat, rhinorrhea, sneezing, trouble swallowing, dental problem, postnasal drip and sinus pressure.   Eyes: Negative for redness and itching.  Respiratory: Negative for cough, chest tightness, shortness of breath and wheezing.   Cardiovascular: Negative for palpitations and leg swelling.  Gastrointestinal: Negative for nausea and vomiting.  Genitourinary: Negative for dysuria.  Musculoskeletal: Negative for joint swelling.  Skin: Negative for rash.  Neurological: Negative for headaches.  Hematological: Does not bruise/bleed easily.  Psychiatric/Behavioral: Negative for dysphoric mood. The patient is not nervous/anxious.        Objective:   Physical Exam  Vitals reviewed. Constitutional: She is oriented to person, place, and time. She appears well-developed and well-nourished. No distress.       Has lost weight  HENT:  Head: Normocephalic and atraumatic.  Right Ear: External ear normal.  Left Ear: External ear normal.  Mouth/Throat: Oropharynx is clear and moist. No oropharyngeal exudate.  Eyes: Conjunctivae and EOM are normal. Pupils are equal, round, and reactive to light. Right eye exhibits no discharge. Left eye exhibits no discharge. No scleral icterus.  Neck: Normal range of motion. Neck supple. No JVD present. No tracheal deviation present. No thyromegaly present.  Cardiovascular: Normal rate, regular rhythm, normal heart sounds and intact distal pulses.  Exam reveals no gallop and no friction rub.   No murmur heard. Pulmonary/Chest: Effort normal and breath sounds normal. No respiratory  distress. She has no wheezes. She has no rales. She exhibits no tenderness.       Rt hickman +  Abdominal: Soft. Bowel sounds are normal. She exhibits no distension and no mass. There is no tenderness. There is no rebound and no guarding.  Musculoskeletal: Normal range of motion. She exhibits no edema and no tenderness.  Lymphadenopathy:    She has no cervical adenopathy.  Neurological: She is alert and oriented to person, place, and time. She has normal reflexes. No cranial nerve deficit. She exhibits normal muscle tone. Coordination normal.  Skin: Skin is warm and dry. No rash noted. She is not diaphoretic. No erythema. No pallor.  Psychiatric: She has a normal mood and affect. Her behavior is normal. Judgment and thought content normal.          Assessment & Plan:

## 2011-04-02 ENCOUNTER — Encounter (HOSPITAL_BASED_OUTPATIENT_CLINIC_OR_DEPARTMENT_OTHER): Payer: 59 | Admitting: Hematology and Oncology

## 2011-04-02 ENCOUNTER — Other Ambulatory Visit: Payer: Self-pay | Admitting: Hematology and Oncology

## 2011-04-02 DIAGNOSIS — I2699 Other pulmonary embolism without acute cor pulmonale: Secondary | ICD-10-CM

## 2011-04-02 DIAGNOSIS — Z86718 Personal history of other venous thrombosis and embolism: Secondary | ICD-10-CM

## 2011-04-02 DIAGNOSIS — Z5111 Encounter for antineoplastic chemotherapy: Secondary | ICD-10-CM

## 2011-04-02 DIAGNOSIS — C20 Malignant neoplasm of rectum: Secondary | ICD-10-CM

## 2011-04-02 DIAGNOSIS — Z7901 Long term (current) use of anticoagulants: Secondary | ICD-10-CM

## 2011-04-02 DIAGNOSIS — K7689 Other specified diseases of liver: Secondary | ICD-10-CM

## 2011-04-02 LAB — CBC WITH DIFFERENTIAL/PLATELET
Basophils Absolute: 0 10*3/uL (ref 0.0–0.1)
HCT: 33.6 % — ABNORMAL LOW (ref 34.8–46.6)
HGB: 11.2 g/dL — ABNORMAL LOW (ref 11.6–15.9)
MONO#: 0.2 10*3/uL (ref 0.1–0.9)
NEUT#: 1.6 10*3/uL (ref 1.5–6.5)
NEUT%: 66.2 % (ref 38.4–76.8)
RDW: 13.6 % (ref 11.2–14.5)
WBC: 2.4 10*3/uL — ABNORMAL LOW (ref 3.9–10.3)
lymph#: 0.5 10*3/uL — ABNORMAL LOW (ref 0.9–3.3)

## 2011-04-02 LAB — COMPREHENSIVE METABOLIC PANEL
ALT: 13 U/L (ref 0–35)
AST: 11 U/L (ref 0–37)
Albumin: 4.1 g/dL (ref 3.5–5.2)
Alkaline Phosphatase: 85 U/L (ref 39–117)
Calcium: 9 mg/dL (ref 8.4–10.5)
Chloride: 105 mEq/L (ref 96–112)
Creatinine, Ser: 0.78 mg/dL (ref 0.50–1.10)
Potassium: 3.7 mEq/L (ref 3.5–5.3)

## 2011-04-02 LAB — PROTIME-INR
INR: 3.2 (ref 2.00–3.50)
Protime: 38.4 Seconds — ABNORMAL HIGH (ref 10.6–13.4)

## 2011-04-02 NOTE — Assessment & Plan Note (Signed)
New onset PE March 3. 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  S/p IVC filter pre-colon cance resection in April 2012 could not be retrieved postop due to clot. S/p Hickman cath since April-may 2012 for chemo. On anticoagulation since March 2012. INR monitoring by Dr. Dalene Carrow. Currently doing well.   PLAN 15 minute face to face discussion  - Coumadin needed till cancer under remission and Hickman out or 1-3 years whichever is longer - IVC filter stays due to clot. Long term issues of venous insuff explained. We could try re-addressing this with IR - Coumadin monitoring by Dr. Kathrine Haddock - ROV march 2013 at end of 1 year couamdin Rx to review further continuation

## 2011-04-09 ENCOUNTER — Encounter (HOSPITAL_BASED_OUTPATIENT_CLINIC_OR_DEPARTMENT_OTHER): Payer: 59 | Admitting: Hematology and Oncology

## 2011-04-09 ENCOUNTER — Other Ambulatory Visit: Payer: Self-pay | Admitting: Hematology and Oncology

## 2011-04-09 DIAGNOSIS — Z86718 Personal history of other venous thrombosis and embolism: Secondary | ICD-10-CM

## 2011-04-09 DIAGNOSIS — Z5181 Encounter for therapeutic drug level monitoring: Secondary | ICD-10-CM

## 2011-04-09 DIAGNOSIS — Z5111 Encounter for antineoplastic chemotherapy: Secondary | ICD-10-CM

## 2011-04-09 DIAGNOSIS — K7689 Other specified diseases of liver: Secondary | ICD-10-CM

## 2011-04-09 DIAGNOSIS — Z7901 Long term (current) use of anticoagulants: Secondary | ICD-10-CM

## 2011-04-09 DIAGNOSIS — C20 Malignant neoplasm of rectum: Secondary | ICD-10-CM

## 2011-04-09 LAB — CBC WITH DIFFERENTIAL/PLATELET
BASO%: 0.4 % (ref 0.0–2.0)
Basophils Absolute: 0 10*3/uL (ref 0.0–0.1)
EOS%: 1.5 % (ref 0.0–7.0)
HCT: 32.9 % — ABNORMAL LOW (ref 34.8–46.6)
HGB: 11 g/dL — ABNORMAL LOW (ref 11.6–15.9)
MCH: 29.2 pg (ref 25.1–34.0)
MCHC: 33.4 g/dL (ref 31.5–36.0)
MCV: 87.3 fL (ref 79.5–101.0)
MONO%: 10.4 % (ref 0.0–14.0)
NEUT%: 79.1 % — ABNORMAL HIGH (ref 38.4–76.8)
lymph#: 0.2 10*3/uL — ABNORMAL LOW (ref 0.9–3.3)

## 2011-04-09 LAB — PROTIME-INR: Protime: 50.4 Seconds — ABNORMAL HIGH (ref 10.6–13.4)

## 2011-04-15 ENCOUNTER — Other Ambulatory Visit: Payer: Self-pay | Admitting: Hematology and Oncology

## 2011-04-15 ENCOUNTER — Encounter (HOSPITAL_BASED_OUTPATIENT_CLINIC_OR_DEPARTMENT_OTHER): Payer: 59 | Admitting: Hematology and Oncology

## 2011-04-15 DIAGNOSIS — Z7901 Long term (current) use of anticoagulants: Secondary | ICD-10-CM

## 2011-04-15 DIAGNOSIS — I2699 Other pulmonary embolism without acute cor pulmonale: Secondary | ICD-10-CM

## 2011-04-15 DIAGNOSIS — Z5111 Encounter for antineoplastic chemotherapy: Secondary | ICD-10-CM

## 2011-04-15 DIAGNOSIS — Z86718 Personal history of other venous thrombosis and embolism: Secondary | ICD-10-CM

## 2011-04-15 DIAGNOSIS — C20 Malignant neoplasm of rectum: Secondary | ICD-10-CM

## 2011-04-15 DIAGNOSIS — K7689 Other specified diseases of liver: Secondary | ICD-10-CM

## 2011-04-15 LAB — CBC WITH DIFFERENTIAL/PLATELET
EOS%: 2.9 % (ref 0.0–7.0)
Eosinophils Absolute: 0.1 10*3/uL (ref 0.0–0.5)
MCH: 29.3 pg (ref 25.1–34.0)
MCV: 87.1 fL (ref 79.5–101.0)
MONO%: 11.1 % (ref 0.0–14.0)
NEUT#: 1.5 10*3/uL (ref 1.5–6.5)
RBC: 3.79 10*6/uL (ref 3.70–5.45)
RDW: 15.4 % — ABNORMAL HIGH (ref 11.2–14.5)
lymph#: 0.3 10*3/uL — ABNORMAL LOW (ref 0.9–3.3)

## 2011-04-15 LAB — PROTIME-INR
INR: 1.8 — ABNORMAL LOW (ref 2.00–3.50)
Protime: 21.6 Seconds — ABNORMAL HIGH (ref 10.6–13.4)

## 2011-04-15 LAB — COMPREHENSIVE METABOLIC PANEL
AST: 11 U/L (ref 0–37)
Albumin: 3.9 g/dL (ref 3.5–5.2)
Alkaline Phosphatase: 66 U/L (ref 39–117)
Potassium: 3.9 mEq/L (ref 3.5–5.3)
Sodium: 141 mEq/L (ref 135–145)
Total Protein: 6.5 g/dL (ref 6.0–8.3)

## 2011-04-16 ENCOUNTER — Encounter (HOSPITAL_BASED_OUTPATIENT_CLINIC_OR_DEPARTMENT_OTHER): Payer: 59 | Admitting: Hematology and Oncology

## 2011-04-16 DIAGNOSIS — C20 Malignant neoplasm of rectum: Secondary | ICD-10-CM

## 2011-04-17 ENCOUNTER — Other Ambulatory Visit: Payer: Self-pay | Admitting: Hematology and Oncology

## 2011-04-17 DIAGNOSIS — C20 Malignant neoplasm of rectum: Secondary | ICD-10-CM

## 2011-04-17 DIAGNOSIS — I2699 Other pulmonary embolism without acute cor pulmonale: Secondary | ICD-10-CM

## 2011-04-22 ENCOUNTER — Other Ambulatory Visit: Payer: Self-pay | Admitting: Hematology and Oncology

## 2011-04-22 ENCOUNTER — Encounter (HOSPITAL_BASED_OUTPATIENT_CLINIC_OR_DEPARTMENT_OTHER): Payer: 59 | Admitting: Hematology and Oncology

## 2011-04-22 DIAGNOSIS — Z86718 Personal history of other venous thrombosis and embolism: Secondary | ICD-10-CM

## 2011-04-22 DIAGNOSIS — Z5111 Encounter for antineoplastic chemotherapy: Secondary | ICD-10-CM

## 2011-04-22 DIAGNOSIS — C20 Malignant neoplasm of rectum: Secondary | ICD-10-CM

## 2011-04-22 LAB — BASIC METABOLIC PANEL
Glucose, Bld: 124 mg/dL — ABNORMAL HIGH (ref 70–99)
Potassium: 3.3 mEq/L — ABNORMAL LOW (ref 3.5–5.3)
Sodium: 135 mEq/L (ref 135–145)

## 2011-04-22 LAB — CBC WITH DIFFERENTIAL/PLATELET
BASO%: 0.4 % (ref 0.0–2.0)
Basophils Absolute: 0 10*3/uL (ref 0.0–0.1)
Eosinophils Absolute: 0.1 10*3/uL (ref 0.0–0.5)
LYMPH%: 9.2 % — ABNORMAL LOW (ref 14.0–49.7)
MCH: 30.1 pg (ref 25.1–34.0)
MCHC: 34.6 g/dL (ref 31.5–36.0)
MCV: 87 fL (ref 79.5–101.0)
MONO#: 0.3 10*3/uL (ref 0.1–0.9)
NEUT%: 76.8 % (ref 38.4–76.8)
RBC: 4.15 10*6/uL (ref 3.70–5.45)
RDW: 16 % — ABNORMAL HIGH (ref 11.2–14.5)

## 2011-04-25 ENCOUNTER — Other Ambulatory Visit: Payer: Self-pay | Admitting: Hematology and Oncology

## 2011-04-25 ENCOUNTER — Ambulatory Visit: Payer: 59 | Admitting: Hematology and Oncology

## 2011-04-25 LAB — PROTIME-INR

## 2011-04-25 LAB — PROTHROMBIN TIME
INR: 4.09 — ABNORMAL HIGH (ref <1.50)
Prothrombin Time: 40.3 seconds — ABNORMAL HIGH (ref 11.6–15.2)

## 2011-04-29 ENCOUNTER — Encounter (HOSPITAL_BASED_OUTPATIENT_CLINIC_OR_DEPARTMENT_OTHER): Payer: 59 | Admitting: Hematology and Oncology

## 2011-04-29 ENCOUNTER — Encounter: Payer: Self-pay | Admitting: Hematology and Oncology

## 2011-04-29 DIAGNOSIS — C20 Malignant neoplasm of rectum: Secondary | ICD-10-CM

## 2011-05-01 ENCOUNTER — Encounter (INDEPENDENT_AMBULATORY_CARE_PROVIDER_SITE_OTHER): Payer: Self-pay | Admitting: General Surgery

## 2011-05-01 ENCOUNTER — Other Ambulatory Visit: Payer: Self-pay | Admitting: Hematology and Oncology

## 2011-05-01 ENCOUNTER — Encounter (HOSPITAL_BASED_OUTPATIENT_CLINIC_OR_DEPARTMENT_OTHER): Payer: 59 | Admitting: Hematology and Oncology

## 2011-05-01 ENCOUNTER — Ambulatory Visit (INDEPENDENT_AMBULATORY_CARE_PROVIDER_SITE_OTHER): Payer: 59 | Admitting: General Surgery

## 2011-05-01 DIAGNOSIS — Z5111 Encounter for antineoplastic chemotherapy: Secondary | ICD-10-CM

## 2011-05-01 DIAGNOSIS — C20 Malignant neoplasm of rectum: Secondary | ICD-10-CM

## 2011-05-01 DIAGNOSIS — Z7901 Long term (current) use of anticoagulants: Secondary | ICD-10-CM

## 2011-05-01 DIAGNOSIS — Z86718 Personal history of other venous thrombosis and embolism: Secondary | ICD-10-CM

## 2011-05-01 LAB — CBC WITH DIFFERENTIAL/PLATELET
Basophils Absolute: 0 10*3/uL (ref 0.0–0.1)
EOS%: 3.5 % (ref 0.0–7.0)
Eosinophils Absolute: 0.1 10*3/uL (ref 0.0–0.5)
HCT: 34.8 % (ref 34.8–46.6)
HGB: 11.8 g/dL (ref 11.6–15.9)
MCH: 30.9 pg (ref 25.1–34.0)
MONO#: 0.3 10*3/uL (ref 0.1–0.9)
NEUT#: 3 10*3/uL (ref 1.5–6.5)
NEUT%: 82.7 % — ABNORMAL HIGH (ref 38.4–76.8)
lymph#: 0.2 10*3/uL — ABNORMAL LOW (ref 0.9–3.3)

## 2011-05-01 LAB — BASIC METABOLIC PANEL
CO2: 27 mEq/L (ref 19–32)
Chloride: 104 mEq/L (ref 96–112)
Glucose, Bld: 126 mg/dL — ABNORMAL HIGH (ref 70–99)
Potassium: 4.1 mEq/L (ref 3.5–5.3)
Sodium: 140 mEq/L (ref 135–145)

## 2011-05-01 NOTE — Progress Notes (Signed)
She is status post low anterior resection for rectal cancer. She is receiving chemotherapy and has received radiation. She's having perianal discomfort but no significant incisional discomfort. Bowels are moving.  Physical exam:  Gen.: She looks well and in no acute distress.  Abdomen: Lower midline incision is clean and intact and solid.  Assessment: Rectal cancer status post low anterior resection as well as chemotherapy and radiation therapy. I think she is progressing well.  Plan: Activities as tolerated. Return visit with me in 3 months.

## 2011-05-03 ENCOUNTER — Other Ambulatory Visit: Payer: Self-pay | Admitting: Hematology and Oncology

## 2011-05-03 ENCOUNTER — Encounter (HOSPITAL_BASED_OUTPATIENT_CLINIC_OR_DEPARTMENT_OTHER): Payer: 59 | Admitting: Hematology and Oncology

## 2011-05-03 DIAGNOSIS — Z7901 Long term (current) use of anticoagulants: Secondary | ICD-10-CM

## 2011-05-03 DIAGNOSIS — Z86718 Personal history of other venous thrombosis and embolism: Secondary | ICD-10-CM

## 2011-05-03 DIAGNOSIS — C20 Malignant neoplasm of rectum: Secondary | ICD-10-CM

## 2011-05-03 DIAGNOSIS — Z5181 Encounter for therapeutic drug level monitoring: Secondary | ICD-10-CM

## 2011-05-03 LAB — CBC WITH DIFFERENTIAL/PLATELET
BASO%: 0.3 % (ref 0.0–2.0)
Basophils Absolute: 0 10*3/uL (ref 0.0–0.1)
EOS%: 4.1 % (ref 0.0–7.0)
HCT: 35.3 % (ref 34.8–46.6)
HGB: 11.7 g/dL (ref 11.6–15.9)
LYMPH%: 10.1 % — ABNORMAL LOW (ref 14.0–49.7)
MCH: 30.2 pg (ref 25.1–34.0)
MCHC: 33.1 g/dL (ref 31.5–36.0)
MCV: 91.2 fL (ref 79.5–101.0)
NEUT%: 77.1 % — ABNORMAL HIGH (ref 38.4–76.8)
Platelets: 217 10*3/uL (ref 145–400)

## 2011-05-07 ENCOUNTER — Other Ambulatory Visit: Payer: Self-pay | Admitting: Hematology and Oncology

## 2011-05-07 ENCOUNTER — Encounter (HOSPITAL_BASED_OUTPATIENT_CLINIC_OR_DEPARTMENT_OTHER): Payer: 59 | Admitting: Hematology and Oncology

## 2011-05-07 DIAGNOSIS — C20 Malignant neoplasm of rectum: Secondary | ICD-10-CM

## 2011-05-07 DIAGNOSIS — Z86718 Personal history of other venous thrombosis and embolism: Secondary | ICD-10-CM

## 2011-05-07 DIAGNOSIS — Z5111 Encounter for antineoplastic chemotherapy: Secondary | ICD-10-CM

## 2011-05-07 LAB — PROTIME-INR
INR: 1.3 — ABNORMAL LOW (ref 2.00–3.50)
Protime: 15.6 Seconds — ABNORMAL HIGH (ref 10.6–13.4)

## 2011-05-14 ENCOUNTER — Other Ambulatory Visit: Payer: Self-pay | Admitting: Hematology and Oncology

## 2011-05-14 ENCOUNTER — Encounter (HOSPITAL_BASED_OUTPATIENT_CLINIC_OR_DEPARTMENT_OTHER): Payer: 59 | Admitting: Hematology and Oncology

## 2011-05-14 DIAGNOSIS — Z5111 Encounter for antineoplastic chemotherapy: Secondary | ICD-10-CM

## 2011-05-14 DIAGNOSIS — Z7901 Long term (current) use of anticoagulants: Secondary | ICD-10-CM

## 2011-05-14 DIAGNOSIS — Z86718 Personal history of other venous thrombosis and embolism: Secondary | ICD-10-CM

## 2011-05-14 DIAGNOSIS — C20 Malignant neoplasm of rectum: Secondary | ICD-10-CM

## 2011-05-14 LAB — BASIC METABOLIC PANEL
BUN: 13 mg/dL (ref 6–23)
Chloride: 104 mEq/L (ref 96–112)
Creatinine, Ser: 0.76 mg/dL (ref 0.50–1.10)
Glucose, Bld: 92 mg/dL (ref 70–99)
Potassium: 4.4 mEq/L (ref 3.5–5.3)

## 2011-05-14 LAB — CBC WITH DIFFERENTIAL/PLATELET
Basophils Absolute: 0 10*3/uL (ref 0.0–0.1)
Eosinophils Absolute: 0.1 10*3/uL (ref 0.0–0.5)
HCT: 37 % (ref 34.8–46.6)
HGB: 12.2 g/dL (ref 11.6–15.9)
MCV: 89.4 fL (ref 79.5–101.0)
MONO%: 10.5 % (ref 0.0–14.0)
NEUT#: 2 10*3/uL (ref 1.5–6.5)
NEUT%: 77.5 % — ABNORMAL HIGH (ref 38.4–76.8)
RDW: 16.6 % — ABNORMAL HIGH (ref 11.2–14.5)
lymph#: 0.2 10*3/uL — ABNORMAL LOW (ref 0.9–3.3)

## 2011-05-14 LAB — PROTIME-INR: INR: 3.3 (ref 2.00–3.50)

## 2011-05-27 ENCOUNTER — Encounter (HOSPITAL_BASED_OUTPATIENT_CLINIC_OR_DEPARTMENT_OTHER): Payer: 59 | Admitting: Hematology and Oncology

## 2011-05-27 ENCOUNTER — Ambulatory Visit (HOSPITAL_COMMUNITY)
Admission: RE | Admit: 2011-05-27 | Discharge: 2011-05-27 | Disposition: A | Discharge status: Home / Self Care | Payer: 59 | Source: Ambulatory Visit | Attending: Hematology and Oncology | Admitting: Hematology and Oncology

## 2011-05-27 ENCOUNTER — Inpatient Hospital Stay (HOSPITAL_COMMUNITY): Admission: RE | Admit: 2011-05-27 | Payer: 59 | Source: Ambulatory Visit

## 2011-05-27 ENCOUNTER — Encounter (HOSPITAL_COMMUNITY): Payer: Self-pay

## 2011-05-27 ENCOUNTER — Other Ambulatory Visit: Payer: Self-pay | Admitting: Hematology and Oncology

## 2011-05-27 DIAGNOSIS — K7689 Other specified diseases of liver: Secondary | ICD-10-CM | POA: Insufficient documentation

## 2011-05-27 DIAGNOSIS — Z86718 Personal history of other venous thrombosis and embolism: Secondary | ICD-10-CM | POA: Insufficient documentation

## 2011-05-27 DIAGNOSIS — C20 Malignant neoplasm of rectum: Secondary | ICD-10-CM

## 2011-05-27 DIAGNOSIS — I2699 Other pulmonary embolism without acute cor pulmonale: Secondary | ICD-10-CM

## 2011-05-27 DIAGNOSIS — N200 Calculus of kidney: Secondary | ICD-10-CM | POA: Insufficient documentation

## 2011-05-27 DIAGNOSIS — Z5111 Encounter for antineoplastic chemotherapy: Secondary | ICD-10-CM

## 2011-05-27 LAB — CBC WITH DIFFERENTIAL/PLATELET
Basophils Absolute: 0 10*3/uL (ref 0.0–0.1)
Eosinophils Absolute: 0.1 10*3/uL (ref 0.0–0.5)
HGB: 12.1 g/dL (ref 11.6–15.9)
LYMPH%: 20 % (ref 14.0–49.7)
MCV: 91.1 fL (ref 79.5–101.0)
MONO#: 0.2 10*3/uL (ref 0.1–0.9)
MONO%: 11.9 % (ref 0.0–14.0)
NEUT#: 1.2 10*3/uL — ABNORMAL LOW (ref 1.5–6.5)
Platelets: 207 10*3/uL (ref 145–400)
WBC: 1.9 10*3/uL — ABNORMAL LOW (ref 3.9–10.3)

## 2011-05-27 LAB — COMPREHENSIVE METABOLIC PANEL
Albumin: 4.3 g/dL (ref 3.5–5.2)
Alkaline Phosphatase: 71 U/L (ref 39–117)
BUN: 13 mg/dL (ref 6–23)
CO2: 26 mEq/L (ref 19–32)
Glucose, Bld: 101 mg/dL — ABNORMAL HIGH (ref 70–99)
Potassium: 4.2 mEq/L (ref 3.5–5.3)
Sodium: 137 mEq/L (ref 135–145)
Total Protein: 7.1 g/dL (ref 6.0–8.3)

## 2011-05-27 LAB — CEA: CEA: 0.6 ng/mL (ref 0.0–5.0)

## 2011-05-27 MED ORDER — IOHEXOL 300 MG/ML  SOLN
100.0000 mL | Freq: Once | INTRAMUSCULAR | Status: AC | PRN
  Administered 2011-05-27: 100 mL via INTRAVENOUS

## 2011-05-29 ENCOUNTER — Encounter (HOSPITAL_BASED_OUTPATIENT_CLINIC_OR_DEPARTMENT_OTHER): Payer: 59 | Admitting: Hematology and Oncology

## 2011-05-29 DIAGNOSIS — C20 Malignant neoplasm of rectum: Secondary | ICD-10-CM

## 2011-05-29 DIAGNOSIS — Z5111 Encounter for antineoplastic chemotherapy: Secondary | ICD-10-CM

## 2011-05-29 DIAGNOSIS — Z86718 Personal history of other venous thrombosis and embolism: Secondary | ICD-10-CM

## 2011-06-03 NOTE — Progress Notes (Signed)
This encounter was created in error - please disregard.

## 2011-06-06 ENCOUNTER — Encounter (HOSPITAL_BASED_OUTPATIENT_CLINIC_OR_DEPARTMENT_OTHER): Payer: 59 | Admitting: Hematology and Oncology

## 2011-06-06 ENCOUNTER — Other Ambulatory Visit: Payer: Self-pay | Admitting: Hematology and Oncology

## 2011-06-06 DIAGNOSIS — Z86718 Personal history of other venous thrombosis and embolism: Secondary | ICD-10-CM

## 2011-06-06 DIAGNOSIS — C20 Malignant neoplasm of rectum: Secondary | ICD-10-CM

## 2011-06-06 DIAGNOSIS — Z5111 Encounter for antineoplastic chemotherapy: Secondary | ICD-10-CM

## 2011-06-06 DIAGNOSIS — Z7901 Long term (current) use of anticoagulants: Secondary | ICD-10-CM

## 2011-06-06 LAB — PROTIME-INR

## 2011-06-13 ENCOUNTER — Ambulatory Visit
Admission: RE | Admit: 2011-06-13 | Discharge: 2011-06-13 | Disposition: A | Discharge status: Home / Self Care | Payer: 59 | Source: Ambulatory Visit | Attending: Radiation Oncology | Admitting: Radiation Oncology

## 2011-06-19 ENCOUNTER — Other Ambulatory Visit: Payer: Self-pay | Admitting: Hematology and Oncology

## 2011-06-19 ENCOUNTER — Encounter (HOSPITAL_BASED_OUTPATIENT_CLINIC_OR_DEPARTMENT_OTHER): Payer: 59 | Admitting: Hematology and Oncology

## 2011-06-19 DIAGNOSIS — Z5181 Encounter for therapeutic drug level monitoring: Secondary | ICD-10-CM

## 2011-06-19 DIAGNOSIS — C20 Malignant neoplasm of rectum: Secondary | ICD-10-CM

## 2011-06-19 DIAGNOSIS — Z86718 Personal history of other venous thrombosis and embolism: Secondary | ICD-10-CM

## 2011-06-19 DIAGNOSIS — Z7901 Long term (current) use of anticoagulants: Secondary | ICD-10-CM

## 2011-06-19 LAB — CBC WITH DIFFERENTIAL/PLATELET
BASO%: 0.4 % (ref 0.0–2.0)
LYMPH%: 21.9 % (ref 14.0–49.7)
MCHC: 33.4 g/dL (ref 31.5–36.0)
MONO#: 0.2 10*3/uL (ref 0.1–0.9)
MONO%: 8.4 % (ref 0.0–14.0)
NEUT#: 1.6 10*3/uL (ref 1.5–6.5)
Platelets: 214 10*3/uL (ref 145–400)
RBC: 4.05 10*6/uL (ref 3.70–5.45)
RDW: 15.3 % — ABNORMAL HIGH (ref 11.2–14.5)
WBC: 2.4 10*3/uL — ABNORMAL LOW (ref 3.9–10.3)
nRBC: 0 % (ref 0–0)

## 2011-06-19 LAB — COMPREHENSIVE METABOLIC PANEL
ALT: 14 U/L (ref 0–35)
AST: 13 U/L (ref 0–37)
Chloride: 104 mEq/L (ref 96–112)
Creatinine, Ser: 0.78 mg/dL (ref 0.50–1.10)
Sodium: 140 mEq/L (ref 135–145)
Total Bilirubin: 0.4 mg/dL (ref 0.3–1.2)
Total Protein: 7.4 g/dL (ref 6.0–8.3)

## 2011-06-19 LAB — PROTIME-INR
INR: 2.3 (ref 2.00–3.50)
Protime: 27.6 Seconds — ABNORMAL HIGH (ref 10.6–13.4)

## 2011-06-26 ENCOUNTER — Encounter (HOSPITAL_BASED_OUTPATIENT_CLINIC_OR_DEPARTMENT_OTHER): Payer: 59 | Admitting: Hematology and Oncology

## 2011-06-26 DIAGNOSIS — C20 Malignant neoplasm of rectum: Secondary | ICD-10-CM

## 2011-06-26 DIAGNOSIS — Z5111 Encounter for antineoplastic chemotherapy: Secondary | ICD-10-CM

## 2011-06-28 ENCOUNTER — Encounter (HOSPITAL_BASED_OUTPATIENT_CLINIC_OR_DEPARTMENT_OTHER): Payer: 59 | Admitting: Hematology and Oncology

## 2011-06-28 DIAGNOSIS — C20 Malignant neoplasm of rectum: Secondary | ICD-10-CM

## 2011-06-28 DIAGNOSIS — Z5189 Encounter for other specified aftercare: Secondary | ICD-10-CM

## 2011-07-06 ENCOUNTER — Encounter (HOSPITAL_COMMUNITY): Payer: Self-pay | Admitting: Radiology

## 2011-07-06 ENCOUNTER — Emergency Department (HOSPITAL_COMMUNITY): Payer: 59

## 2011-07-06 ENCOUNTER — Emergency Department (HOSPITAL_COMMUNITY)
Admission: EM | Admit: 2011-07-06 | Discharge: 2011-07-07 | Disposition: A | Discharge status: Home / Self Care | Payer: 59 | Attending: Emergency Medicine | Admitting: Emergency Medicine

## 2011-07-06 DIAGNOSIS — R109 Unspecified abdominal pain: Secondary | ICD-10-CM | POA: Insufficient documentation

## 2011-07-06 DIAGNOSIS — M549 Dorsalgia, unspecified: Secondary | ICD-10-CM | POA: Insufficient documentation

## 2011-07-06 DIAGNOSIS — Z86711 Personal history of pulmonary embolism: Secondary | ICD-10-CM | POA: Insufficient documentation

## 2011-07-06 DIAGNOSIS — Z85038 Personal history of other malignant neoplasm of large intestine: Secondary | ICD-10-CM | POA: Insufficient documentation

## 2011-07-06 LAB — DIFFERENTIAL
Basophils Absolute: 0 10*3/uL (ref 0.0–0.1)
Lymphocytes Relative: 6 % — ABNORMAL LOW (ref 12–46)
Monocytes Relative: 3 % (ref 3–12)
Neutro Abs: 8.3 10*3/uL — ABNORMAL HIGH (ref 1.7–7.7)
Neutrophils Relative %: 91 % — ABNORMAL HIGH (ref 43–77)
Smear Review: ADEQUATE

## 2011-07-06 LAB — CBC
MCV: 90.2 fL (ref 78.0–100.0)
Platelets: 169 10*3/uL (ref 150–400)
RBC: 3.88 MIL/uL (ref 3.87–5.11)
RDW: 15.5 % (ref 11.5–15.5)
WBC: 9.1 10*3/uL (ref 4.0–10.5)

## 2011-07-06 LAB — POCT PREGNANCY, URINE: Preg Test, Ur: NEGATIVE

## 2011-07-06 LAB — POCT I-STAT, CHEM 8
BUN: 18 mg/dL (ref 6–23)
Chloride: 106 mEq/L (ref 96–112)
HCT: 37 % (ref 36.0–46.0)
Potassium: 4.1 mEq/L (ref 3.5–5.1)
Sodium: 143 mEq/L (ref 135–145)

## 2011-07-06 LAB — URINALYSIS, ROUTINE W REFLEX MICROSCOPIC
Bilirubin Urine: NEGATIVE
Nitrite: NEGATIVE
Specific Gravity, Urine: 1.025 (ref 1.005–1.030)
pH: 6.5 (ref 5.0–8.0)

## 2011-07-06 MED ORDER — IOHEXOL 300 MG/ML  SOLN
100.0000 mL | Freq: Once | INTRAMUSCULAR | Status: AC | PRN
  Administered 2011-07-06: 100 mL via INTRAVENOUS

## 2011-07-09 ENCOUNTER — Other Ambulatory Visit: Payer: Self-pay | Admitting: Hematology and Oncology

## 2011-07-09 ENCOUNTER — Encounter (HOSPITAL_BASED_OUTPATIENT_CLINIC_OR_DEPARTMENT_OTHER): Payer: 59 | Admitting: Hematology and Oncology

## 2011-07-09 DIAGNOSIS — Z86718 Personal history of other venous thrombosis and embolism: Secondary | ICD-10-CM

## 2011-07-09 DIAGNOSIS — C20 Malignant neoplasm of rectum: Secondary | ICD-10-CM

## 2011-07-09 DIAGNOSIS — Z5111 Encounter for antineoplastic chemotherapy: Secondary | ICD-10-CM

## 2011-07-09 DIAGNOSIS — Z7901 Long term (current) use of anticoagulants: Secondary | ICD-10-CM

## 2011-07-09 LAB — CBC WITH DIFFERENTIAL/PLATELET
Basophils Absolute: 0 10*3/uL (ref 0.0–0.1)
Eosinophils Absolute: 0 10*3/uL (ref 0.0–0.5)
HCT: 37.2 % (ref 34.8–46.6)
HGB: 12.5 g/dL (ref 11.6–15.9)
LYMPH%: 8.7 % — ABNORMAL LOW (ref 14.0–49.7)
MONO#: 0.3 10*3/uL (ref 0.1–0.9)
NEUT#: 5.2 10*3/uL (ref 1.5–6.5)
Platelets: 146 10*3/uL (ref 145–400)
RBC: 4.03 10*6/uL (ref 3.70–5.45)
WBC: 6.1 10*3/uL (ref 3.9–10.3)

## 2011-07-09 LAB — COMPREHENSIVE METABOLIC PANEL
Albumin: 4.4 g/dL (ref 3.5–5.2)
CO2: 25 mEq/L (ref 19–32)
Glucose, Bld: 116 mg/dL — ABNORMAL HIGH (ref 70–99)
Sodium: 138 mEq/L (ref 135–145)
Total Bilirubin: 0.6 mg/dL (ref 0.3–1.2)
Total Protein: 7.3 g/dL (ref 6.0–8.3)

## 2011-07-10 ENCOUNTER — Encounter (HOSPITAL_BASED_OUTPATIENT_CLINIC_OR_DEPARTMENT_OTHER): Payer: 59 | Admitting: Hematology and Oncology

## 2011-07-10 DIAGNOSIS — Z5111 Encounter for antineoplastic chemotherapy: Secondary | ICD-10-CM

## 2011-07-10 DIAGNOSIS — C20 Malignant neoplasm of rectum: Secondary | ICD-10-CM

## 2011-07-12 ENCOUNTER — Encounter (HOSPITAL_BASED_OUTPATIENT_CLINIC_OR_DEPARTMENT_OTHER): Payer: 59 | Admitting: Hematology and Oncology

## 2011-07-12 DIAGNOSIS — Z5189 Encounter for other specified aftercare: Secondary | ICD-10-CM

## 2011-07-12 DIAGNOSIS — C20 Malignant neoplasm of rectum: Secondary | ICD-10-CM

## 2011-07-23 ENCOUNTER — Other Ambulatory Visit: Payer: Self-pay | Admitting: Hematology and Oncology

## 2011-07-23 ENCOUNTER — Encounter (HOSPITAL_BASED_OUTPATIENT_CLINIC_OR_DEPARTMENT_OTHER): Payer: 59 | Admitting: Hematology and Oncology

## 2011-07-23 DIAGNOSIS — Z86718 Personal history of other venous thrombosis and embolism: Secondary | ICD-10-CM

## 2011-07-23 DIAGNOSIS — Z7901 Long term (current) use of anticoagulants: Secondary | ICD-10-CM

## 2011-07-23 DIAGNOSIS — K625 Hemorrhage of anus and rectum: Secondary | ICD-10-CM

## 2011-07-23 DIAGNOSIS — C20 Malignant neoplasm of rectum: Secondary | ICD-10-CM

## 2011-07-23 LAB — COMPREHENSIVE METABOLIC PANEL
ALT: 17 U/L (ref 0–35)
AST: 14 U/L (ref 0–37)
Creatinine, Ser: 0.75 mg/dL (ref 0.50–1.10)
Total Bilirubin: 0.3 mg/dL (ref 0.3–1.2)

## 2011-07-23 LAB — CBC WITH DIFFERENTIAL/PLATELET
Eosinophils Absolute: 0 10*3/uL (ref 0.0–0.5)
MONO#: 0.2 10*3/uL (ref 0.1–0.9)
NEUT#: 5.3 10*3/uL (ref 1.5–6.5)
Platelets: 131 10*3/uL — ABNORMAL LOW (ref 145–400)
RBC: 3.66 10*6/uL — ABNORMAL LOW (ref 3.70–5.45)
RDW: 16.6 % — ABNORMAL HIGH (ref 11.2–14.5)
WBC: 6 10*3/uL (ref 3.9–10.3)

## 2011-07-23 LAB — PROTIME-INR

## 2011-07-23 LAB — PROTHROMBIN TIME
INR: 6.58 (ref <1.50)
Prothrombin Time: 58.4 seconds — ABNORMAL HIGH (ref 11.6–15.2)

## 2011-07-24 ENCOUNTER — Encounter (HOSPITAL_BASED_OUTPATIENT_CLINIC_OR_DEPARTMENT_OTHER): Payer: 59 | Admitting: Hematology and Oncology

## 2011-07-24 DIAGNOSIS — Z5111 Encounter for antineoplastic chemotherapy: Secondary | ICD-10-CM

## 2011-07-24 DIAGNOSIS — C20 Malignant neoplasm of rectum: Secondary | ICD-10-CM

## 2011-07-26 ENCOUNTER — Encounter (HOSPITAL_BASED_OUTPATIENT_CLINIC_OR_DEPARTMENT_OTHER): Payer: 59 | Admitting: Hematology and Oncology

## 2011-07-26 DIAGNOSIS — Z5189 Encounter for other specified aftercare: Secondary | ICD-10-CM

## 2011-07-26 DIAGNOSIS — C20 Malignant neoplasm of rectum: Secondary | ICD-10-CM

## 2011-07-30 ENCOUNTER — Encounter (HOSPITAL_BASED_OUTPATIENT_CLINIC_OR_DEPARTMENT_OTHER): Payer: 59 | Admitting: Hematology and Oncology

## 2011-07-30 ENCOUNTER — Other Ambulatory Visit: Payer: Self-pay | Admitting: Hematology and Oncology

## 2011-07-30 DIAGNOSIS — C20 Malignant neoplasm of rectum: Secondary | ICD-10-CM

## 2011-07-30 DIAGNOSIS — Z86718 Personal history of other venous thrombosis and embolism: Secondary | ICD-10-CM

## 2011-07-30 DIAGNOSIS — Z7901 Long term (current) use of anticoagulants: Secondary | ICD-10-CM

## 2011-08-06 ENCOUNTER — Other Ambulatory Visit: Payer: Self-pay | Admitting: Hematology and Oncology

## 2011-08-06 ENCOUNTER — Encounter (HOSPITAL_BASED_OUTPATIENT_CLINIC_OR_DEPARTMENT_OTHER): Payer: 59 | Admitting: Hematology and Oncology

## 2011-08-06 DIAGNOSIS — Z7901 Long term (current) use of anticoagulants: Secondary | ICD-10-CM

## 2011-08-06 DIAGNOSIS — C20 Malignant neoplasm of rectum: Secondary | ICD-10-CM

## 2011-08-06 DIAGNOSIS — Z86718 Personal history of other venous thrombosis and embolism: Secondary | ICD-10-CM

## 2011-08-06 DIAGNOSIS — Z5111 Encounter for antineoplastic chemotherapy: Secondary | ICD-10-CM

## 2011-08-06 LAB — COMPREHENSIVE METABOLIC PANEL
ALT: 19 U/L (ref 0–35)
AST: 13 U/L (ref 0–37)
Albumin: 3.7 g/dL (ref 3.5–5.2)
Alkaline Phosphatase: 154 U/L — ABNORMAL HIGH (ref 39–117)
Glucose, Bld: 128 mg/dL — ABNORMAL HIGH (ref 70–99)
Potassium: 4 mEq/L (ref 3.5–5.3)
Sodium: 138 mEq/L (ref 135–145)
Total Protein: 7.4 g/dL (ref 6.0–8.3)

## 2011-08-06 LAB — CBC WITH DIFFERENTIAL/PLATELET
BASO%: 0.3 % (ref 0.0–2.0)
Eosinophils Absolute: 0 10*3/uL (ref 0.0–0.5)
MCHC: 33.3 g/dL (ref 31.5–36.0)
MCV: 95.4 fL (ref 79.5–101.0)
MONO%: 5.2 % (ref 0.0–14.0)
NEUT#: 7.2 10*3/uL — ABNORMAL HIGH (ref 1.5–6.5)
RBC: 3.61 10*6/uL — ABNORMAL LOW (ref 3.70–5.45)
RDW: 17.1 % — ABNORMAL HIGH (ref 11.2–14.5)
WBC: 8.1 10*3/uL (ref 3.9–10.3)

## 2011-08-06 LAB — PROTIME-INR

## 2011-08-06 LAB — PROTHROMBIN TIME: INR: 4.94 — ABNORMAL HIGH (ref <1.50)

## 2011-08-07 ENCOUNTER — Encounter (HOSPITAL_BASED_OUTPATIENT_CLINIC_OR_DEPARTMENT_OTHER): Payer: 59 | Admitting: Hematology and Oncology

## 2011-08-07 DIAGNOSIS — Z5111 Encounter for antineoplastic chemotherapy: Secondary | ICD-10-CM

## 2011-08-09 DIAGNOSIS — C20 Malignant neoplasm of rectum: Secondary | ICD-10-CM

## 2011-08-09 DIAGNOSIS — Z5189 Encounter for other specified aftercare: Secondary | ICD-10-CM

## 2011-08-12 ENCOUNTER — Encounter (HOSPITAL_BASED_OUTPATIENT_CLINIC_OR_DEPARTMENT_OTHER): Payer: 59 | Admitting: Hematology and Oncology

## 2011-08-12 ENCOUNTER — Other Ambulatory Visit: Payer: Self-pay | Admitting: Hematology and Oncology

## 2011-08-12 DIAGNOSIS — Z7901 Long term (current) use of anticoagulants: Secondary | ICD-10-CM

## 2011-08-12 DIAGNOSIS — Z5111 Encounter for antineoplastic chemotherapy: Secondary | ICD-10-CM

## 2011-08-12 DIAGNOSIS — Z86718 Personal history of other venous thrombosis and embolism: Secondary | ICD-10-CM

## 2011-08-12 DIAGNOSIS — C20 Malignant neoplasm of rectum: Secondary | ICD-10-CM

## 2011-08-12 LAB — PROTIME-INR

## 2011-08-20 ENCOUNTER — Other Ambulatory Visit: Payer: Self-pay | Admitting: Hematology and Oncology

## 2011-08-20 ENCOUNTER — Encounter (HOSPITAL_BASED_OUTPATIENT_CLINIC_OR_DEPARTMENT_OTHER): Payer: 59 | Admitting: Hematology and Oncology

## 2011-08-20 DIAGNOSIS — Z5111 Encounter for antineoplastic chemotherapy: Secondary | ICD-10-CM

## 2011-08-20 DIAGNOSIS — C20 Malignant neoplasm of rectum: Secondary | ICD-10-CM

## 2011-08-20 DIAGNOSIS — Z86718 Personal history of other venous thrombosis and embolism: Secondary | ICD-10-CM

## 2011-08-20 DIAGNOSIS — Z7901 Long term (current) use of anticoagulants: Secondary | ICD-10-CM

## 2011-08-20 LAB — CBC WITH DIFFERENTIAL/PLATELET
Basophils Absolute: 0 10*3/uL (ref 0.0–0.1)
Eosinophils Absolute: 0 10*3/uL (ref 0.0–0.5)
HCT: 34.5 % — ABNORMAL LOW (ref 34.8–46.6)
HGB: 11.4 g/dL — ABNORMAL LOW (ref 11.6–15.9)
LYMPH%: 14.5 % (ref 14.0–49.7)
MONO#: 0.5 10*3/uL (ref 0.1–0.9)
NEUT#: 4 10*3/uL (ref 1.5–6.5)
NEUT%: 75.2 % (ref 38.4–76.8)
Platelets: 103 10*3/uL — ABNORMAL LOW (ref 145–400)
WBC: 5.4 10*3/uL (ref 3.9–10.3)
lymph#: 0.8 10*3/uL — ABNORMAL LOW (ref 0.9–3.3)

## 2011-08-20 LAB — PROTIME-INR

## 2011-08-20 LAB — COMPREHENSIVE METABOLIC PANEL
ALT: 18 U/L (ref 0–35)
AST: 15 U/L (ref 0–37)
Creatinine, Ser: 0.75 mg/dL (ref 0.50–1.10)
Sodium: 140 mEq/L (ref 135–145)
Total Bilirubin: 0.3 mg/dL (ref 0.3–1.2)
Total Protein: 6.3 g/dL (ref 6.0–8.3)

## 2011-08-21 ENCOUNTER — Encounter (HOSPITAL_BASED_OUTPATIENT_CLINIC_OR_DEPARTMENT_OTHER): Payer: 59 | Admitting: Hematology and Oncology

## 2011-08-21 DIAGNOSIS — Z5111 Encounter for antineoplastic chemotherapy: Secondary | ICD-10-CM

## 2011-08-21 DIAGNOSIS — C20 Malignant neoplasm of rectum: Secondary | ICD-10-CM

## 2011-08-22 ENCOUNTER — Other Ambulatory Visit: Payer: Self-pay | Admitting: Gynecology

## 2011-08-22 DIAGNOSIS — Z1231 Encounter for screening mammogram for malignant neoplasm of breast: Secondary | ICD-10-CM

## 2011-08-23 ENCOUNTER — Encounter (HOSPITAL_BASED_OUTPATIENT_CLINIC_OR_DEPARTMENT_OTHER): Payer: 59 | Admitting: Hematology and Oncology

## 2011-08-23 DIAGNOSIS — Z5189 Encounter for other specified aftercare: Secondary | ICD-10-CM

## 2011-08-23 DIAGNOSIS — C20 Malignant neoplasm of rectum: Secondary | ICD-10-CM

## 2011-08-28 ENCOUNTER — Other Ambulatory Visit: Payer: 59 | Admitting: Lab

## 2011-08-28 ENCOUNTER — Ambulatory Visit: Payer: 59

## 2011-08-28 ENCOUNTER — Telehealth: Payer: Self-pay | Admitting: Hematology and Oncology

## 2011-08-28 NOTE — Telephone Encounter (Signed)
Added coumadin clinic appt for 11/13 per jenna. Due to appts already on schedule for lb/fu coumadin clinic was added for 11:45 am. S/w pt re add on and confirmed appts for lb @ 10:45 am, f/u @ 11:15 am and coumadin clinic appt @ 11:45 am. Message sent to [facility].

## 2011-08-29 ENCOUNTER — Encounter: Payer: Self-pay | Admitting: Physician Assistant

## 2011-08-29 DIAGNOSIS — C20 Malignant neoplasm of rectum: Secondary | ICD-10-CM | POA: Insufficient documentation

## 2011-09-02 ENCOUNTER — Other Ambulatory Visit: Payer: Self-pay | Admitting: Hematology and Oncology

## 2011-09-03 ENCOUNTER — Ambulatory Visit (HOSPITAL_BASED_OUTPATIENT_CLINIC_OR_DEPARTMENT_OTHER): Payer: 59 | Admitting: Physician Assistant

## 2011-09-03 ENCOUNTER — Telehealth: Payer: Self-pay | Admitting: Hematology and Oncology

## 2011-09-03 ENCOUNTER — Encounter: Payer: Self-pay | Admitting: *Deleted

## 2011-09-03 ENCOUNTER — Other Ambulatory Visit: Payer: Self-pay | Admitting: Hematology and Oncology

## 2011-09-03 ENCOUNTER — Ambulatory Visit (HOSPITAL_BASED_OUTPATIENT_CLINIC_OR_DEPARTMENT_OTHER): Payer: Self-pay | Admitting: Pharmacist

## 2011-09-03 ENCOUNTER — Other Ambulatory Visit (HOSPITAL_BASED_OUTPATIENT_CLINIC_OR_DEPARTMENT_OTHER): Payer: 59 | Admitting: Lab

## 2011-09-03 ENCOUNTER — Encounter: Payer: Self-pay | Admitting: General Surgery

## 2011-09-03 ENCOUNTER — Ambulatory Visit: Payer: 59

## 2011-09-03 VITALS — BP 112/77 | HR 74 | Temp 98.5°F | Ht 72.0 in | Wt 188.9 lb

## 2011-09-03 DIAGNOSIS — C259 Malignant neoplasm of pancreas, unspecified: Secondary | ICD-10-CM

## 2011-09-03 DIAGNOSIS — C20 Malignant neoplasm of rectum: Secondary | ICD-10-CM

## 2011-09-03 DIAGNOSIS — Z7901 Long term (current) use of anticoagulants: Secondary | ICD-10-CM

## 2011-09-03 DIAGNOSIS — I2699 Other pulmonary embolism without acute cor pulmonale: Secondary | ICD-10-CM

## 2011-09-03 DIAGNOSIS — C775 Secondary and unspecified malignant neoplasm of intrapelvic lymph nodes: Secondary | ICD-10-CM

## 2011-09-03 DIAGNOSIS — Z86711 Personal history of pulmonary embolism: Secondary | ICD-10-CM

## 2011-09-03 LAB — CBC WITH DIFFERENTIAL/PLATELET
BASO%: 0.2 % (ref 0.0–2.0)
EOS%: 0.5 % (ref 0.0–7.0)
HGB: 11.6 g/dL (ref 11.6–15.9)
MCH: 31.9 pg (ref 25.1–34.0)
MCHC: 33.5 g/dL (ref 31.5–36.0)
RDW: 19.1 % — ABNORMAL HIGH (ref 11.2–14.5)
lymph#: 0.4 10*3/uL — ABNORMAL LOW (ref 0.9–3.3)

## 2011-09-03 LAB — COMPREHENSIVE METABOLIC PANEL
AST: 19 U/L (ref 0–37)
Albumin: 3.9 g/dL (ref 3.5–5.2)
Alkaline Phosphatase: 136 U/L — ABNORMAL HIGH (ref 39–117)
Potassium: 4.1 mEq/L (ref 3.5–5.3)
Sodium: 141 mEq/L (ref 135–145)
Total Protein: 6.3 g/dL (ref 6.0–8.3)

## 2011-09-03 LAB — PROTIME-INR

## 2011-09-03 LAB — POCT INR: INR: 3.8

## 2011-09-03 NOTE — Progress Notes (Signed)
Used Excedrin a few days ago for migraine HA.

## 2011-09-03 NOTE — Telephone Encounter (Signed)
gve the pt her coumadin clinic appt.

## 2011-09-03 NOTE — Progress Notes (Signed)
CC:   [name], M.D. Merlene Laughter. [name], M.D. [name], M.D. [name], MD, [name], M.D.  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  INTERIM HISTORY:  Ms. Wahlert reports since her last clinic visit on October 30th, she has had some fatigue, but is able to complete ADLs. She has had no fevers, chills, or night sweats.  No dyspnea or cough. No chest pain.  She reports normal appetite and has had no problems with nausea, vomiting, constipation, or abdominal pain.  She does continue to report intermittent loose stools and she takes Imodium for this with good relief.  She states this morning she did have about 2 tablespoons of bright red blood in her stool, as well as some small clots, approximately 2 clots.  She states it did not hurt to have a bowel movement and then she had 2 additional bowel movements after this with no bleeding.  She has had no dysuria, no frequency or hematuria.  No pain or tenderness or redness from her Port-A-Cath site.  CURRENT MEDICATIONS:  Current medications are reviewed and recorded.  PHYSICAL EXAMINATION:  Vital Signs:  Temperature is 98.5, heart rate 74, respirations 20, blood pressure 112/77, weight 188 pounds.  General: This is a well-developed, well-nourished black female in no acute distress.  HEENT:  Sclerae are nonicteric.  There is no thrush or mucositis.  Skin:  No rashes or lesions.  Lymph:  No cervical, supraclavicular, axillary, or inguinal lymphadenopathy.  Cardiac: Regular rate and rhythm without murmurs or gallops.  Peripheral pulses are 2+.  She has a right subclavian Port-A-Cath without signs of infection.  Chest:  Lungs clear to auscultation.  Abdomen:  Positive bowel sounds.  Soft, nontender,  nondistended.  No organomegaly. Extremities:  Without edema, cyanosis, or calf tenderness.  Neuro: Alert and oriented x3.  Strength, sensation, and coordination all grossly intact.  LABORATORIES:  CBC with diff reveals white blood count of 5, hemoglobin 11.6, hematocrit 34.7, platelets of 91, ANC of 4.2, and MCV of 95.3. PTT of 45.6 and INR of 3.80.  CMET is currently pending.  IMPRESSIONS/PLAN: 1. [name] is a 31 year old black female who is status post low     anterior resection January 31, 2011, for T3 N1 moderate to low-grade     differentiated adenocarcinoma of the rectum with 3 out of 14     positive lymph nodes.  She received pelvic external radiation     therapy and continuous infusion 5-FU Mar 19, 2011, through April 29, 2011, completing chemotherapy on that date and radiation on April 30, 2011.  She proceeded with additional chemotherapy with FOLFOX-6     with a planned course of 6 months of treatment, starting therapy     June 26, 2011.  She is due for her next cycle of chemotherapy     in the morning, which will be cycle #7.  Reinforced teaching of     treatment of potential side effects and management of side effects     with emphasis on potential for fatigue, myelosuppression,     mucositis, nausea, vomiting, diarrhea, hand-foot syndrome,     peripheral neuropathy, cold sensitivity, and arthralgias and the     patient verbalized understanding. 2. Patient with history of  pulmonary embolism, status post IVC filter.     She remains on Coumadin and is followed at the [facility] for     this. 3. The patient did report 1 episode of bright red rectal bleeding this     morning with bowel movement and then no further bleeding with 2     subsequent bowel movements.  She is advised to continue to monitor     this and if she has any future issues with bleeding, she is to the     call and notify Dr. Charna Elizabeth for further evaluation and     management and she  verbalized understanding. 4. The patient will be scheduled for followup with Dr. Dalene Carrow in 2     weeks' time, at which time we will reassess CBC with diff and CMET.     We anticipate her receiving additional chemotherapy after MD visit.     She is advised to call in the interim if any questions or problems.    ______________________________ Sherilyn Banker, MSN, ANP, BC RJ/MEDQ  D:  09/03/2011  T:  09/03/2011  Job:  [id number]

## 2011-09-03 NOTE — Patient Instructions (Signed)
Hold today's Coumadin dose.

## 2011-09-03 NOTE — Progress Notes (Signed)
This office note has been dictated.

## 2011-09-04 ENCOUNTER — Ambulatory Visit (HOSPITAL_BASED_OUTPATIENT_CLINIC_OR_DEPARTMENT_OTHER): Payer: 59

## 2011-09-04 VITALS — BP 135/92 | HR 80 | Temp 97.8°F

## 2011-09-04 DIAGNOSIS — Z5111 Encounter for antineoplastic chemotherapy: Secondary | ICD-10-CM

## 2011-09-04 DIAGNOSIS — C20 Malignant neoplasm of rectum: Secondary | ICD-10-CM

## 2011-09-04 MED ORDER — DEXTROSE 5 % IV SOLN
Freq: Once | INTRAVENOUS | Status: AC
  Administered 2011-09-04: 13:00:00 via INTRAVENOUS

## 2011-09-04 MED ORDER — OXALIPLATIN CHEMO INJECTION 100 MG/20ML
85.0000 mg/m2 | Freq: Once | INTRAVENOUS | Status: AC
  Administered 2011-09-04: 180 mg via INTRAVENOUS
  Filled 2011-09-04: qty 36

## 2011-09-04 MED ORDER — SODIUM CHLORIDE 0.9 % IV SOLN
2400.0000 mg/m2 | INTRAVENOUS | Status: DC
  Administered 2011-09-04: 5000 mg via INTRAVENOUS
  Filled 2011-09-04: qty 100

## 2011-09-04 MED ORDER — ONDANSETRON 16 MG/50ML IVPB (CHCC)
8.0000 mg | Freq: Once | INTRAVENOUS | Status: AC
  Administered 2011-09-04: 8 mg via INTRAVENOUS

## 2011-09-04 MED ORDER — DEXAMETHASONE SODIUM PHOSPHATE 10 MG/ML IJ SOLN
10.0000 mg | Freq: Once | INTRAMUSCULAR | Status: AC
  Administered 2011-09-04: 10 mg via INTRAVENOUS

## 2011-09-04 MED ORDER — LEUCOVORIN CALCIUM INJECTION 350 MG
400.0000 mg/m2 | Freq: Once | INTRAVENOUS | Status: AC
  Administered 2011-09-04: 836 mg via INTRAVENOUS
  Filled 2011-09-04: qty 41.8

## 2011-09-04 MED ORDER — FLUOROURACIL CHEMO INJECTION 2.5 GM/50ML
400.0000 mg/m2 | Freq: Once | INTRAVENOUS | Status: AC
  Administered 2011-09-04: 850 mg via INTRAVENOUS
  Filled 2011-09-04: qty 17

## 2011-09-04 NOTE — Progress Notes (Signed)
09/04/11- Okay to treat today with plt count 91k per Marlou Porch NP the parameter to treat for pt per Dr Dalene Carrow is if plt are greater then 75k.

## 2011-09-05 ENCOUNTER — Encounter (INDEPENDENT_AMBULATORY_CARE_PROVIDER_SITE_OTHER): Payer: 59 | Admitting: General Surgery

## 2011-09-06 ENCOUNTER — Other Ambulatory Visit: Payer: Self-pay | Admitting: Pharmacist

## 2011-09-06 ENCOUNTER — Ambulatory Visit (HOSPITAL_BASED_OUTPATIENT_CLINIC_OR_DEPARTMENT_OTHER): Payer: 59

## 2011-09-06 VITALS — BP 133/89 | HR 68 | Temp 98.2°F

## 2011-09-06 DIAGNOSIS — I2699 Other pulmonary embolism without acute cor pulmonale: Secondary | ICD-10-CM

## 2011-09-06 DIAGNOSIS — C20 Malignant neoplasm of rectum: Secondary | ICD-10-CM

## 2011-09-06 MED ORDER — PEGFILGRASTIM INJECTION 6 MG/0.6ML
6.0000 mg | Freq: Once | SUBCUTANEOUS | Status: AC
  Administered 2011-09-06: 6 mg via SUBCUTANEOUS
  Filled 2011-09-06: qty 0.6

## 2011-09-06 MED ORDER — HEPARIN SOD (PORK) LOCK FLUSH 100 UNIT/ML IV SOLN
500.0000 [IU] | Freq: Once | INTRAVENOUS | Status: AC | PRN
  Administered 2011-09-06: 500 [IU]
  Filled 2011-09-06: qty 5

## 2011-09-06 MED ORDER — SODIUM CHLORIDE 0.9 % IJ SOLN
10.0000 mL | INTRAMUSCULAR | Status: DC | PRN
  Administered 2011-09-06: 10 mL
  Filled 2011-09-06: qty 10

## 2011-09-09 ENCOUNTER — Other Ambulatory Visit: Payer: Self-pay | Admitting: Hematology and Oncology

## 2011-09-09 ENCOUNTER — Other Ambulatory Visit (HOSPITAL_BASED_OUTPATIENT_CLINIC_OR_DEPARTMENT_OTHER): Payer: 59 | Admitting: Lab

## 2011-09-09 ENCOUNTER — Ambulatory Visit: Payer: 59

## 2011-09-09 DIAGNOSIS — C20 Malignant neoplasm of rectum: Secondary | ICD-10-CM

## 2011-09-09 DIAGNOSIS — I2699 Other pulmonary embolism without acute cor pulmonale: Secondary | ICD-10-CM

## 2011-09-09 NOTE — Progress Notes (Unsigned)
Only complaint is that her family came down with a stomach bug this past week.  She suffered from the GI illness all this weekend, so missed her dose of Coumadin once on Friday.  No other problems.  Likely that her recent high INRs may be due to her use of corticosteroids surrounding her chemo treatment.  Will reassess her INR in 2 weeks, after her next round of chemo.

## 2011-09-10 ENCOUNTER — Telehealth: Payer: Self-pay | Admitting: Hematology and Oncology

## 2011-09-10 NOTE — Telephone Encounter (Signed)
Added lb/coumadin clinic appt for 12/3 @ 4 pm. S/w per coumadin clinic.

## 2011-09-17 ENCOUNTER — Other Ambulatory Visit (HOSPITAL_BASED_OUTPATIENT_CLINIC_OR_DEPARTMENT_OTHER): Payer: 59 | Admitting: Lab

## 2011-09-17 ENCOUNTER — Ambulatory Visit (HOSPITAL_BASED_OUTPATIENT_CLINIC_OR_DEPARTMENT_OTHER): Payer: 59 | Admitting: Hematology and Oncology

## 2011-09-17 ENCOUNTER — Other Ambulatory Visit: Payer: Self-pay | Admitting: Hematology and Oncology

## 2011-09-17 ENCOUNTER — Encounter: Payer: Self-pay | Admitting: Hematology and Oncology

## 2011-09-17 VITALS — BP 124/77 | HR 82 | Temp 96.6°F | Ht 72.0 in | Wt 187.7 lb

## 2011-09-17 DIAGNOSIS — Z86711 Personal history of pulmonary embolism: Secondary | ICD-10-CM

## 2011-09-17 DIAGNOSIS — C779 Secondary and unspecified malignant neoplasm of lymph node, unspecified: Secondary | ICD-10-CM

## 2011-09-17 DIAGNOSIS — N390 Urinary tract infection, site not specified: Secondary | ICD-10-CM

## 2011-09-17 DIAGNOSIS — C20 Malignant neoplasm of rectum: Secondary | ICD-10-CM

## 2011-09-17 LAB — COMPREHENSIVE METABOLIC PANEL
ALT: 32 U/L (ref 0–35)
AST: 24 U/L (ref 0–37)
Calcium: 9 mg/dL (ref 8.4–10.5)
Glucose, Bld: 132 mg/dL — ABNORMAL HIGH (ref 70–99)
Total Protein: 6.3 g/dL (ref 6.0–8.3)

## 2011-09-17 LAB — CBC WITH DIFFERENTIAL/PLATELET
Basophils Absolute: 0.1 10*3/uL (ref 0.0–0.1)
EOS%: 0.7 % (ref 0.0–7.0)
Eosinophils Absolute: 0 10*3/uL (ref 0.0–0.5)
HGB: 11.5 g/dL — ABNORMAL LOW (ref 11.6–15.9)
LYMPH%: 7.5 % — ABNORMAL LOW (ref 14.0–49.7)
MCH: 31.6 pg (ref 25.1–34.0)
MCV: 96 fL (ref 79.5–101.0)
MONO%: 9 % (ref 0.0–14.0)
NEUT#: 4.2 10*3/uL (ref 1.5–6.5)
NEUT%: 81 % — ABNORMAL HIGH (ref 38.4–76.8)
Platelets: 99 10*3/uL — ABNORMAL LOW (ref 145–400)

## 2011-09-17 MED ORDER — MOXIFLOXACIN HCL 400 MG PO TABS: 400.0000 mg | ORAL_TABLET | Freq: Every day | ORAL | Status: AC

## 2011-09-17 NOTE — Progress Notes (Signed)
This office note has been dictated.

## 2011-09-17 NOTE — Progress Notes (Signed)
CC:   [name], M.D. [name]. [name], M.D. [name], M.D. [name] Rod, MD, [name], M.D.  IDENTIFYING STATEMENT:  The patient is a 45 year old woman with rectal cancer who presents for followup prior to chemotherapy.  INTERVAL HISTORY:  Mrs.  [name] caught a bout of viral gastroenteritis. She complained of nausea and vomiting, which have resolved.  She has an ongoing persistent cough with white phlegm.  She is afebrile.  She denies sensory or motor neuropathy.  She denies epistaxis.  She denies pain.  In general, she is tolerating therapy well.  MEDICATIONS:  Reviewed and updated.  PAST MEDICAL HISTORY:  Unchanged.  FAMILY HISTORY:  Unchanged.  SOCIAL HISTORY:  Unchanged.  ALLERGIES:  PENICILLIN.  REVIEW OF SYSTEMS:  10-point review of systems is negative.  PHYSICAL EXAMINATION:  General Appearance:  The patient is a well- appearing, well-nourished woman in no distress.  Vital Signs:  Pulse 82. Blood pressure 124/77.  Temperature 96.6.  Respirations 20.  Weight 187.7 pounds.  HEENT:  Head is atraumatic, normocephalic.  Sclerae are anicteric.  Mouth is moist.  Neck:  Supple.  Chest:  Clear to percussion on auscultation.  CVS:  First and second heart sounds are present with no added sounds or murmurs.  Abdomen:  Soft, nontender.  Bowel sounds present.  Extremities:  No edema.  Port-A-Cath without infection.  LABORATORY DATA:  Lab data from 09/17/2011:  White cell count 5.2, hemoglobin 11.5, hematocrit 35, platelets 99.  CMET is pending.  IMPRESSION AND PLAN:  [name] is a 45 year old woman with: 1. Status post low anterior resection on 01/31/2011 for a T3 N1b,     moderately low-grade differentiated adenocarcinoma of the rectum     with 3/14 positive lymph nodes.  She is status post radiation     therapy with continuous infusion 5-FU from 03/19/2011 to     04/30/2011.  She began FOLFOX6 on 07/03/2011.  Her platelets are  99,000, but she can proceed with the next cycle in the morning.  At     the time of pump discontinuation, she will receive Neulasta     support.  She follows up in 2 weeks' time prior to her final cycle. 2. History of pulmonary embolus, status post inferior vena cava     filter.  She remains anticoagulated with Coumadin. 3. Upper respiratory tract infection, likely viral, but we will cover     her with a course of antibiotics. She was given prescription for      Avelox 400 mg p.o. daily for 5 days.    ______________________________ Laurice Record, M.D. LIO/MEDQ  D:  09/17/2011  T:  09/17/2011  Job:  [id number]

## 2011-09-18 ENCOUNTER — Ambulatory Visit (HOSPITAL_BASED_OUTPATIENT_CLINIC_OR_DEPARTMENT_OTHER): Payer: 59

## 2011-09-18 ENCOUNTER — Ambulatory Visit: Payer: Self-pay | Admitting: Pharmacist

## 2011-09-18 ENCOUNTER — Other Ambulatory Visit (HOSPITAL_BASED_OUTPATIENT_CLINIC_OR_DEPARTMENT_OTHER): Payer: 59 | Admitting: Lab

## 2011-09-18 VITALS — BP 124/74 | HR 77 | Temp 97.0°F

## 2011-09-18 DIAGNOSIS — I2699 Other pulmonary embolism without acute cor pulmonale: Secondary | ICD-10-CM

## 2011-09-18 DIAGNOSIS — C20 Malignant neoplasm of rectum: Secondary | ICD-10-CM

## 2011-09-18 DIAGNOSIS — Z5111 Encounter for antineoplastic chemotherapy: Secondary | ICD-10-CM

## 2011-09-18 LAB — PROTIME-INR
INR: 4.9 — ABNORMAL HIGH (ref 2.00–3.50)
Protime: 58.8 Seconds — ABNORMAL HIGH (ref 10.6–13.4)

## 2011-09-18 MED ORDER — OXALIPLATIN CHEMO INJECTION 100 MG/20ML
85.0000 mg/m2 | Freq: Once | INTRAVENOUS | Status: AC
  Administered 2011-09-18: 180 mg via INTRAVENOUS
  Filled 2011-09-18: qty 36

## 2011-09-18 MED ORDER — SODIUM CHLORIDE 0.9 % IV SOLN
2400.0000 mg/m2 | INTRAVENOUS | Status: DC
  Administered 2011-09-18: 5000 mg via INTRAVENOUS
  Filled 2011-09-18: qty 100

## 2011-09-18 MED ORDER — LEUCOVORIN CALCIUM INJECTION 350 MG
400.0000 mg/m2 | Freq: Once | INTRAVENOUS | Status: AC
  Administered 2011-09-18: 836 mg via INTRAVENOUS
  Filled 2011-09-18: qty 41.8

## 2011-09-18 MED ORDER — DEXAMETHASONE SODIUM PHOSPHATE 10 MG/ML IJ SOLN
10.0000 mg | Freq: Once | INTRAMUSCULAR | Status: AC
  Administered 2011-09-18: 10 mg via INTRAVENOUS

## 2011-09-18 MED ORDER — ONDANSETRON 8 MG/50ML IVPB (CHCC)
8.0000 mg | Freq: Once | INTRAVENOUS | Status: AC
  Administered 2011-09-18: 8 mg via INTRAVENOUS

## 2011-09-18 MED ORDER — FLUOROURACIL CHEMO INJECTION 2.5 GM/50ML
400.0000 mg/m2 | Freq: Once | INTRAVENOUS | Status: AC
  Administered 2011-09-18: 850 mg via INTRAVENOUS
  Filled 2011-09-18: qty 17

## 2011-09-19 ENCOUNTER — Ambulatory Visit
Admission: RE | Admit: 2011-09-19 | Discharge: 2011-09-19 | Disposition: A | Discharge status: Home / Self Care | Payer: 59 | Source: Ambulatory Visit | Attending: Gynecology | Admitting: Gynecology

## 2011-09-19 ENCOUNTER — Other Ambulatory Visit: Payer: Self-pay | Admitting: Gynecology

## 2011-09-19 DIAGNOSIS — Z1231 Encounter for screening mammogram for malignant neoplasm of breast: Secondary | ICD-10-CM

## 2011-09-20 ENCOUNTER — Other Ambulatory Visit: Payer: Self-pay | Admitting: *Deleted

## 2011-09-20 ENCOUNTER — Other Ambulatory Visit: Payer: Self-pay | Admitting: Hematology and Oncology

## 2011-09-20 ENCOUNTER — Ambulatory Visit (HOSPITAL_BASED_OUTPATIENT_CLINIC_OR_DEPARTMENT_OTHER): Payer: 59

## 2011-09-20 VITALS — BP 131/85 | HR 66 | Temp 97.2°F

## 2011-09-20 DIAGNOSIS — C20 Malignant neoplasm of rectum: Secondary | ICD-10-CM

## 2011-09-20 MED ORDER — PEGFILGRASTIM INJECTION 6 MG/0.6ML
6.0000 mg | Freq: Once | SUBCUTANEOUS | Status: AC
  Administered 2011-09-20: 6 mg via SUBCUTANEOUS
  Filled 2011-09-20: qty 0.6

## 2011-09-20 MED ORDER — HEPARIN SOD (PORK) LOCK FLUSH 100 UNIT/ML IV SOLN
500.0000 [IU] | Freq: Once | INTRAVENOUS | Status: AC | PRN
  Administered 2011-09-20: 500 [IU]
  Filled 2011-09-20: qty 5

## 2011-09-20 MED ORDER — SODIUM CHLORIDE 0.9 % IJ SOLN
10.0000 mL | INTRAMUSCULAR | Status: DC | PRN
  Administered 2011-09-20: 10 mL
  Filled 2011-09-20: qty 10

## 2011-09-23 ENCOUNTER — Ambulatory Visit: Payer: 59

## 2011-09-23 ENCOUNTER — Other Ambulatory Visit: Payer: 59 | Admitting: Lab

## 2011-09-26 ENCOUNTER — Ambulatory Visit: Payer: 59

## 2011-09-26 ENCOUNTER — Other Ambulatory Visit (HOSPITAL_BASED_OUTPATIENT_CLINIC_OR_DEPARTMENT_OTHER): Payer: 59

## 2011-09-26 ENCOUNTER — Ambulatory Visit: Payer: Self-pay | Admitting: Hematology and Oncology

## 2011-09-26 ENCOUNTER — Other Ambulatory Visit: Payer: Self-pay | Admitting: Hematology and Oncology

## 2011-09-26 DIAGNOSIS — Z86718 Personal history of other venous thrombosis and embolism: Secondary | ICD-10-CM

## 2011-09-26 DIAGNOSIS — I2699 Other pulmonary embolism without acute cor pulmonale: Secondary | ICD-10-CM

## 2011-09-26 LAB — POCT INR: INR: 2.1

## 2011-09-26 LAB — PROTIME-INR

## 2011-09-26 NOTE — Progress Notes (Signed)
Elevated INR last week likely related to steroid pulse with chemotherapy.  INR today is back within therapeutic range after holding 2 doses as instructed, and resuming Coumadin at 2.5mg  daily except 5mg  on Friday. Will continue current dose today, and recheck INR with labs drawn prior to next round of chemo - 10/02/11.

## 2011-09-29 ENCOUNTER — Other Ambulatory Visit: Payer: Self-pay | Admitting: Hematology and Oncology

## 2011-10-01 ENCOUNTER — Other Ambulatory Visit (HOSPITAL_BASED_OUTPATIENT_CLINIC_OR_DEPARTMENT_OTHER): Payer: 59 | Admitting: Lab

## 2011-10-01 ENCOUNTER — Other Ambulatory Visit: Payer: Self-pay | Admitting: Hematology and Oncology

## 2011-10-01 ENCOUNTER — Ambulatory Visit (HOSPITAL_BASED_OUTPATIENT_CLINIC_OR_DEPARTMENT_OTHER): Payer: 59 | Admitting: Hematology and Oncology

## 2011-10-01 VITALS — BP 136/87 | HR 77 | Temp 97.0°F | Ht 71.0 in | Wt 189.5 lb

## 2011-10-01 DIAGNOSIS — Z7901 Long term (current) use of anticoagulants: Secondary | ICD-10-CM

## 2011-10-01 DIAGNOSIS — C20 Malignant neoplasm of rectum: Secondary | ICD-10-CM

## 2011-10-01 DIAGNOSIS — D696 Thrombocytopenia, unspecified: Secondary | ICD-10-CM

## 2011-10-01 DIAGNOSIS — Z86711 Personal history of pulmonary embolism: Secondary | ICD-10-CM

## 2011-10-01 LAB — CBC WITH DIFFERENTIAL/PLATELET
BASO%: 0.6 % (ref 0.0–2.0)
Basophils Absolute: 0 10*3/uL (ref 0.0–0.1)
EOS%: 0.4 % (ref 0.0–7.0)
HGB: 11.2 g/dL — ABNORMAL LOW (ref 11.6–15.9)
MCH: 31.5 pg (ref 25.1–34.0)
MONO#: 0.6 10*3/uL (ref 0.1–0.9)
RDW: 19 % — ABNORMAL HIGH (ref 11.2–14.5)
WBC: 6.9 10*3/uL (ref 3.9–10.3)
lymph#: 0.6 10*3/uL — ABNORMAL LOW (ref 0.9–3.3)

## 2011-10-01 LAB — COMPREHENSIVE METABOLIC PANEL
Alkaline Phosphatase: 165 U/L — ABNORMAL HIGH (ref 39–117)
BUN: 15 mg/dL (ref 6–23)
CO2: 27 mEq/L (ref 19–32)
Creatinine, Ser: 0.63 mg/dL (ref 0.50–1.10)
Glucose, Bld: 127 mg/dL — ABNORMAL HIGH (ref 70–99)
Sodium: 142 mEq/L (ref 135–145)
Total Bilirubin: 0.3 mg/dL (ref 0.3–1.2)
Total Protein: 6.1 g/dL (ref 6.0–8.3)

## 2011-10-01 NOTE — Progress Notes (Signed)
This office note has been dictated.

## 2011-10-01 NOTE — Progress Notes (Signed)
CC:   [name]. [name], M.D. [name], M.D. [name], MD, [name], M.D. [name], M.D.  IDENTIFYING STATEMENT:  Patient is a 45 year old woman with rectal cancer who presents for followup.  INTERIM HISTORY:  [name] reports low back pain likely from Neulasta periodically and last week.  Pain is now resolved.  She is also noting increasing sensory neuropathy involving her hands, her jaw and eye.  She also notes her eyelids twitch a little more than frequently.  She has also noted profound fatigue following therapy and recovery period has lasted a lot longer.  She has no bruising or bleeding.  Her nausea and vomiting are well controlled with antiemetics.  Besides pigmentation, she denies hand-foot syndrome.  She denies mucositis and remains afebrile.  MEDICATIONS:  Reviewed and updated.  PAST MEDICAL HISTORY/SOCIAL HISTORY/FAMILY HISTORY:  Unchanged.  REVIEW OF SYSTEMS:  As above.  10 point review of systems negative.  PHYSICAL EXAMINATION:  The patient is alert and oriented x3.  Vitals: Pulse 77, blood pressure 136/87, temperature 97, respirations 18, weight 189.5 pounds.  HEENT:  Head is atraumatic, normocephalic.  Sclerae anicteric.  Mouth moist.  Neck:  Supple.  Chest:  Clear.  Port-A-Cath with no signs infection.  CVS:  First and second heart sounds present. No added sounds or murmurs.  Abdomen:  Soft.  Bowel sounds present. Extremities:  Pigmentation with no hand-foot syndrome.  CNS:  No focal deficit.  Grade 1/2 sensory neuropathy.  LABORATORY DATA:  10/01/2011 white cell count 6.9, hemoglobin 11.2, hematocrit 33.7, platelets 47.  CMET pending.  IMPRESSION/PLAN:  [name] is a 45 year old woman with: 1. Status post low anterior resection on January 31, 2011 for a T3 N1b     moderately low-grade differentiated adenocarcinoma of the rectum     with 3/14 positive lymph nodes.  She is status post radiation     therapy with  continuous infusion 5-FU from Mar 19, 2011 to April 30, 2011.  She began FOLFOX 6 on July 03, 2011.  She has received     7 cycles and in general has tolerated therapy well.  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  In addition, she is thrombocytopenic with no evidence     of bruising or bleeding.  With this said, I will hold oxaliplatin     and bolus 5-FU from therapy.  Will dose reduce continuous infusion     by 50%.  The patient will proceed and complete treatment in the     morning.  She will follow up for pump discontinuation in 2 days     time without Neulasta.  She is to monitor herself for bleeding or     bruising.  She then follows up in 4 weeks time and at that point     will obtain staging CTs of the chest, abdomen, and pelvis with     CBC, CMET and CEA. 2. History of pulmonary embolism status post IVC filter.  She remains     on anticoagulation.    ______________________________ Laurice Record, M.D. LIO/MEDQ  D:  10/01/2011  T:  10/01/2011  Job:  [id number]

## 2011-10-02 ENCOUNTER — Ambulatory Visit: Payer: Self-pay | Admitting: Hematology and Oncology

## 2011-10-02 ENCOUNTER — Ambulatory Visit: Payer: 59

## 2011-10-02 ENCOUNTER — Other Ambulatory Visit (HOSPITAL_BASED_OUTPATIENT_CLINIC_OR_DEPARTMENT_OTHER): Payer: 59 | Admitting: Lab

## 2011-10-02 ENCOUNTER — Ambulatory Visit (HOSPITAL_BASED_OUTPATIENT_CLINIC_OR_DEPARTMENT_OTHER): Payer: 59

## 2011-10-02 VITALS — BP 110/74 | HR 86 | Temp 98.4°F

## 2011-10-02 DIAGNOSIS — C20 Malignant neoplasm of rectum: Secondary | ICD-10-CM

## 2011-10-02 DIAGNOSIS — Z5111 Encounter for antineoplastic chemotherapy: Secondary | ICD-10-CM

## 2011-10-02 DIAGNOSIS — C775 Secondary and unspecified malignant neoplasm of intrapelvic lymph nodes: Secondary | ICD-10-CM

## 2011-10-02 DIAGNOSIS — I2699 Other pulmonary embolism without acute cor pulmonale: Secondary | ICD-10-CM

## 2011-10-02 DIAGNOSIS — C779 Secondary and unspecified malignant neoplasm of lymph node, unspecified: Secondary | ICD-10-CM

## 2011-10-02 LAB — PROTIME-INR

## 2011-10-02 LAB — POCT INR: INR: 2.4

## 2011-10-02 MED ORDER — DEXAMETHASONE SODIUM PHOSPHATE 10 MG/ML IJ SOLN
10.0000 mg | Freq: Once | INTRAMUSCULAR | Status: AC
  Administered 2011-10-02: 10 mg via INTRAVENOUS

## 2011-10-02 MED ORDER — SODIUM CHLORIDE 0.9 % IV SOLN
1200.0000 mg/m2 | INTRAVENOUS | Status: DC
  Administered 2011-10-02: 2500 mg via INTRAVENOUS
  Filled 2011-10-02: qty 50

## 2011-10-02 MED ORDER — ONDANSETRON 8 MG/50ML IVPB (CHCC)
8.0000 mg | Freq: Once | INTRAVENOUS | Status: AC
  Administered 2011-10-02: 8 mg via INTRAVENOUS

## 2011-10-02 MED ORDER — LEUCOVORIN CALCIUM INJECTION 350 MG
400.0000 mg/m2 | Freq: Once | INTRAVENOUS | Status: AC
  Administered 2011-10-02: 836 mg via INTRAVENOUS
  Filled 2011-10-02: qty 41.8

## 2011-10-04 ENCOUNTER — Ambulatory Visit (HOSPITAL_BASED_OUTPATIENT_CLINIC_OR_DEPARTMENT_OTHER): Payer: 59

## 2011-10-04 VITALS — BP 121/83 | HR 88 | Temp 97.2°F

## 2011-10-04 DIAGNOSIS — C20 Malignant neoplasm of rectum: Secondary | ICD-10-CM

## 2011-10-04 DIAGNOSIS — Z469 Encounter for fitting and adjustment of unspecified device: Secondary | ICD-10-CM

## 2011-10-04 MED ORDER — HEPARIN SOD (PORK) LOCK FLUSH 100 UNIT/ML IV SOLN
500.0000 [IU] | Freq: Once | INTRAVENOUS | Status: AC | PRN
  Administered 2011-10-04: 500 [IU]
  Filled 2011-10-04: qty 5

## 2011-10-04 MED ORDER — SODIUM CHLORIDE 0.9 % IJ SOLN
10.0000 mL | INTRAMUSCULAR | Status: DC | PRN
  Administered 2011-10-04: 10 mL
  Filled 2011-10-04: qty 10

## 2011-10-08 ENCOUNTER — Ambulatory Visit (INDEPENDENT_AMBULATORY_CARE_PROVIDER_SITE_OTHER): Payer: 59 | Admitting: General Surgery

## 2011-10-08 ENCOUNTER — Encounter (INDEPENDENT_AMBULATORY_CARE_PROVIDER_SITE_OTHER): Payer: Self-pay

## 2011-10-08 ENCOUNTER — Encounter (INDEPENDENT_AMBULATORY_CARE_PROVIDER_SITE_OTHER): Payer: Self-pay | Admitting: General Surgery

## 2011-10-08 VITALS — BP 112/80 | HR 80 | Temp 97.6°F | Resp 16 | Ht 71.0 in | Wt 189.2 lb

## 2011-10-08 DIAGNOSIS — Z85048 Personal history of other malignant neoplasm of rectum, rectosigmoid junction, and anus: Secondary | ICD-10-CM

## 2011-10-08 NOTE — Patient Instructions (Signed)
Call when it is okay to remove your Portacath.

## 2011-10-08 NOTE — Progress Notes (Signed)
She is status post low anterior resection for rectal cancer and is here for a followup visit. She has completed her chemotherapy and radiation therapy.  She had two episodes of rectal bleeding during her treatment.  Repeat CTs are scheduled for 1/13 and colonoscopy is tentatively scheduled for 4/13.  Physical exam:  Gen.: She looks well and in no acute distress.  Chest:  Portacath in right upper chest.  No supraclavicular adenopathy  Abdomen: Lower midline scar is clean and intact and solid with no nodules.  No hepatomegaly.  Assessment: Rectal cancer status post low anterior resection as well as chemotherapy and radiation therapy. No clinical evidence of disease.  Plan: RTC prn.  Will be glad to remove her Portacath when it is ready to come out.  Her IVC filter is now permanent and I explained that to her.  Procedure and risks of Portacath removal were discussed with her including but not limited to bleeding, infection, wound healing problems.  She will need to be off her Coumadin for 5 days preop.

## 2011-10-16 ENCOUNTER — Ambulatory Visit
Admission: RE | Admit: 2011-10-16 | Discharge: 2011-10-16 | Disposition: A | Discharge status: Home / Self Care | Payer: 59 | Source: Ambulatory Visit | Attending: Gynecology | Admitting: Gynecology

## 2011-10-16 DIAGNOSIS — Z1231 Encounter for screening mammogram for malignant neoplasm of breast: Secondary | ICD-10-CM

## 2011-10-24 ENCOUNTER — Telehealth: Payer: Self-pay | Admitting: Nurse Practitioner

## 2011-10-24 ENCOUNTER — Other Ambulatory Visit: Payer: Self-pay | Admitting: Nurse Practitioner

## 2011-10-24 NOTE — Telephone Encounter (Signed)
Pt called c/o dull ache in lower abdomen and bloating.  Onset since 12/30.  Reports aggravating factors: "doing too much around the house"; also awakened at night at times with pain.  Sitting down and resting relieves pain.  Also utilizing Tylenol with little relief.  Denies nausea, vomiting, diarrhea, rectal bleeding or other GI symptoms.  RN informed pt would review information with Dr. Dalene Carrow and return call.

## 2011-10-24 NOTE — Telephone Encounter (Signed)
Spoke with patient.  Per Dr. Dalene Carrow- would like to move scans to sooner appointment.  Pt agreed. Request sent to scheduler to move CT scan appointment.

## 2011-10-25 ENCOUNTER — Ambulatory Visit (HOSPITAL_COMMUNITY)
Admission: RE | Admit: 2011-10-25 | Discharge: 2011-10-25 | Disposition: A | Discharge status: Home / Self Care | Payer: 59 | Source: Ambulatory Visit | Attending: Hematology and Oncology | Admitting: Hematology and Oncology

## 2011-10-25 ENCOUNTER — Encounter (HOSPITAL_COMMUNITY): Payer: Self-pay

## 2011-10-25 DIAGNOSIS — C20 Malignant neoplasm of rectum: Secondary | ICD-10-CM | POA: Insufficient documentation

## 2011-10-25 DIAGNOSIS — R911 Solitary pulmonary nodule: Secondary | ICD-10-CM | POA: Insufficient documentation

## 2011-10-25 DIAGNOSIS — Z9049 Acquired absence of other specified parts of digestive tract: Secondary | ICD-10-CM | POA: Insufficient documentation

## 2011-10-25 DIAGNOSIS — Z9221 Personal history of antineoplastic chemotherapy: Secondary | ICD-10-CM | POA: Insufficient documentation

## 2011-10-25 DIAGNOSIS — Z923 Personal history of irradiation: Secondary | ICD-10-CM | POA: Insufficient documentation

## 2011-10-25 DIAGNOSIS — K7689 Other specified diseases of liver: Secondary | ICD-10-CM | POA: Insufficient documentation

## 2011-10-25 MED ORDER — IOHEXOL 300 MG/ML  SOLN
100.0000 mL | Freq: Once | INTRAMUSCULAR | Status: AC | PRN
  Administered 2011-10-25: 100 mL via INTRAVENOUS

## 2011-10-29 ENCOUNTER — Other Ambulatory Visit (HOSPITAL_COMMUNITY): Payer: Self-pay | Admitting: Hematology and Oncology

## 2011-11-01 ENCOUNTER — Other Ambulatory Visit (HOSPITAL_COMMUNITY): Payer: 59

## 2011-11-01 ENCOUNTER — Ambulatory Visit: Payer: 59

## 2011-11-01 ENCOUNTER — Other Ambulatory Visit (HOSPITAL_BASED_OUTPATIENT_CLINIC_OR_DEPARTMENT_OTHER): Payer: 59 | Admitting: Lab

## 2011-11-01 DIAGNOSIS — I2699 Other pulmonary embolism without acute cor pulmonale: Secondary | ICD-10-CM

## 2011-11-01 LAB — PROTIME-INR
INR: 1.1 — ABNORMAL LOW (ref 2.00–3.50)
Protime: 13.2 Seconds (ref 10.6–13.4)

## 2011-11-01 LAB — POCT INR: INR: 1.1

## 2011-11-01 NOTE — Progress Notes (Unsigned)
Begin Lovenox 120mg  daily starting today. (MD aware of last documented pltc on 10/01/11 = 47. Pltc has likely returned to normal since patient has not had chemo since 10/02/11. Will check CBC on 11/05/11). Pt provided with 4 Lovenox 120mg  syringes. Increase Coumadin to 5mg  daily. Recheck INR with MD visit on 11/05/11. Reviewed food high in Vitamin K content. Reviewed signs and symptoms of DVT/PE.

## 2011-11-05 ENCOUNTER — Ambulatory Visit (HOSPITAL_BASED_OUTPATIENT_CLINIC_OR_DEPARTMENT_OTHER): Payer: 59 | Admitting: Hematology and Oncology

## 2011-11-05 ENCOUNTER — Other Ambulatory Visit (HOSPITAL_BASED_OUTPATIENT_CLINIC_OR_DEPARTMENT_OTHER): Payer: 59 | Admitting: Hematology and Oncology

## 2011-11-05 ENCOUNTER — Ambulatory Visit: Payer: 59

## 2011-11-05 ENCOUNTER — Telehealth: Payer: Self-pay | Admitting: Hematology and Oncology

## 2011-11-05 ENCOUNTER — Other Ambulatory Visit: Payer: 59 | Admitting: Lab

## 2011-11-05 ENCOUNTER — Ambulatory Visit: Payer: Self-pay | Admitting: Hematology and Oncology

## 2011-11-05 VITALS — BP 114/77 | HR 68 | Temp 97.4°F | Ht 71.0 in | Wt 195.4 lb

## 2011-11-05 DIAGNOSIS — Z452 Encounter for adjustment and management of vascular access device: Secondary | ICD-10-CM

## 2011-11-05 DIAGNOSIS — I2699 Other pulmonary embolism without acute cor pulmonale: Secondary | ICD-10-CM

## 2011-11-05 DIAGNOSIS — C20 Malignant neoplasm of rectum: Secondary | ICD-10-CM

## 2011-11-05 DIAGNOSIS — Z09 Encounter for follow-up examination after completed treatment for conditions other than malignant neoplasm: Secondary | ICD-10-CM

## 2011-11-05 LAB — CBC WITH DIFFERENTIAL/PLATELET
BASO%: 0.6 % (ref 0.0–2.0)
EOS%: 4.1 % (ref 0.0–7.0)
MCHC: 33.8 g/dL (ref 31.5–36.0)
MONO#: 0.2 10*3/uL (ref 0.1–0.9)
RBC: 3.71 10*6/uL (ref 3.70–5.45)
WBC: 1.7 10*3/uL — ABNORMAL LOW (ref 3.9–10.3)
lymph#: 0.5 10*3/uL — ABNORMAL LOW (ref 0.9–3.3)
nRBC: 0 % (ref 0–0)

## 2011-11-05 LAB — PROTIME-INR: INR: 1.1 — ABNORMAL LOW (ref 2.00–3.50)

## 2011-11-05 MED ORDER — ALTEPLASE 2 MG IJ SOLR
2.0000 mg | Freq: Once | INTRAMUSCULAR | Status: DC | PRN
  Filled 2011-11-05: qty 2

## 2011-11-05 MED ORDER — HEPARIN SOD (PORK) LOCK FLUSH 100 UNIT/ML IV SOLN
500.0000 [IU] | Freq: Once | INTRAVENOUS | Status: AC
  Administered 2011-11-05: 500 [IU] via INTRAVENOUS
  Filled 2011-11-05: qty 5

## 2011-11-05 MED ORDER — SODIUM CHLORIDE 0.9 % IJ SOLN
10.0000 mL | INTRAMUSCULAR | Status: DC | PRN
  Administered 2011-11-05: 10 mL via INTRAVENOUS
  Filled 2011-11-05: qty 10

## 2011-11-05 NOTE — Progress Notes (Signed)
CC:   [name]. [name], M.D. [name], M.D. [name] Rod, MD, [name], M.D. [name], M.D.  IDENTIFYING STATEMENT:  The patient is a 46 year old woman with rectal cancer who presents for followup.  INTERIM HISTORY:  [name] has completed therapy.  She recently received CT scans.  She reported periodic abdominal pain with loose stools for which she was taking Imodium around-the-clock.  She denies  nausea or vomiting.  She has had no bleeding.  She is still on Coumadin. She denies oxaliplatin-induced motor or sensory neuropathy.  She also denies hand-foot syndrome.  I reviewed her recent CT scans. Chest CT showed no new findings for lung lesions.  There is no mediastinal or hilar adenopathy. The right lower lobe lung nodule remains stable.  There is no evidence of pulmonary embolism.  The right-sided Port-A-Cath was seen to terminate in the right atrium.  The liver showed mild hepatic steatosis. Otherwise, there were no masses or densities noted.  The spleen, pancreas, gallbladder, biliary tract, and adrenal glands were unremarkable.  The IVC filter was appropriately positioned below the level of renal veins.  The small retroperitoneal lymph nodes remain unchanged with no retroperitoneal or retrocrural adenopathy.  There were no defined soft tissue mass in the rectum to suggest locally recurrent disease.  There was no pelvic adenopathy.  There was no acute osseous abnormality.  MEDICATIONS:  Reviewed and updated.  PAST MEDICAL HISTORY:  Unchanged.  FAMILY HISTORY:  Unchanged.  SOCIAL HISTORY:  Unchanged.  REVIEW OF SYSTEMS:  As above.  A 10-point review of systems is negative.  PHYSICAL EXAMINATION:  General Appearance:  The patient is a well- appearing, well-nourished woman in no distress.  Vital Signs:  Pulse 68. Blood pressure 114/77.  Temperature 97.4.  Respirations 20.  Weight 195 pounds.  HEENT:  Head is atraumatic, normocephalic.   Sclerae are anicteric.  Mouth is moist.  Neck:  Supple.  Chest:  Clear to percussion and auscultation.  CVS:  Unremarkable.  Abdomen:  Soft, nontender.  No masses.  Bowel sounds present.  Extremities:  No calf tenderness. Pulses are present and symmetrical.  Lymph Nodes:  No adenopathy.  CNS: Nonfocal.  LABORATORY DATA:  11/05/2011, white cell count 1.7, hemoglobin 12, hematocrit 35.5, platelets 162.  CEA and CMET are pending.  IMPRESSION AND PLAN:  [name] is a 46 year old woman with: 1. Status post low anterior resection on 01/31/2011 for a T3 N1b,     moderate to low-grade differentiated adenocarcinoma of the rectum     with 3/14 positive lymph nodes.  She is status post radiation     therapy with continuous infusion 5-FU from 03/19/2011 through     04/30/2011.  She began FOLFOX6 on 07/03/2011.  She has received 8     cycles.  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  She obtained     staging CTs that indicated no evidence of recurrent disease. 2. History of pulmonary embolism, status post inferior vena cava     filter.  I would recommend she remain on anticoagulation for the     time being until we get a series of stable CT scans.  The patient returns in 3-4 months' time with repeat CTs.  In addition, she will have her port flushed every 6-8 weeks.  Spent more than halfs  time coordinating care.    ______________________________ Laurice Record, M.D. LIO/MEDQ  D:  11/05/2011  T:  11/05/2011  Job:  [id number]

## 2011-11-05 NOTE — Progress Notes (Signed)
This office note has been dictated.

## 2011-11-05 NOTE — Telephone Encounter (Signed)
gv pt appt for [id number].  scheduled ct scan for 04/11 @ [facility].  pt will not have flush in April will assess port for ct

## 2011-11-05 NOTE — Patient Instructions (Signed)
Continue on lovenox and same dose of coumadin and return to clinic in one week.

## 2011-11-08 ENCOUNTER — Ambulatory Visit: Payer: 59

## 2011-11-08 ENCOUNTER — Other Ambulatory Visit (HOSPITAL_BASED_OUTPATIENT_CLINIC_OR_DEPARTMENT_OTHER): Payer: 59 | Admitting: Lab

## 2011-11-08 DIAGNOSIS — I2699 Other pulmonary embolism without acute cor pulmonale: Secondary | ICD-10-CM

## 2011-11-08 LAB — PROTIME-INR

## 2011-11-08 LAB — POCT INR: INR: 1.3

## 2011-11-08 NOTE — Patient Instructions (Signed)
Take Lovenox 120 mg daily. Increase Coumadin to 7.5 mg daily (take 1 & 1/2 tabs of 5 mg strength)

## 2011-11-08 NOTE — Progress Notes (Unsigned)
INR still subtherapeutic. [name] c/o bilateral thigh pain.  She attributes it to sitting a lot.  She says the pain is worse in the mornings when she walks but as the day goes on, the pain goes away.  She states she has had this type of pain before.  There is no swelling.  No SOB, CP, etc. In looking back at the time of her initial dosing of Coumadin (when she was not on chemotherapy) her Coumadin dose was 7.5 mg daily.  Her INR was therapeutic then. I will go ahead & increase her dose of Coumadin now to 7.5 mg/day since she is all done w/ treatments. She will continue taking Lovenox until her INR = 2-3. Return in 5 days. I gave her 4 more syringes of Lovenox 120 mg to get her through until her appt on 1/23. [name], Pharm.D.

## 2011-11-13 ENCOUNTER — Other Ambulatory Visit (HOSPITAL_BASED_OUTPATIENT_CLINIC_OR_DEPARTMENT_OTHER): Payer: 59 | Admitting: Lab

## 2011-11-13 ENCOUNTER — Ambulatory Visit: Payer: 59

## 2011-11-13 DIAGNOSIS — I2699 Other pulmonary embolism without acute cor pulmonale: Secondary | ICD-10-CM

## 2011-11-13 LAB — PROTIME-INR: INR: 2.2 (ref 2.00–3.50)

## 2011-11-13 NOTE — Progress Notes (Unsigned)
Stop Lovenox.  Continue Coumadin 7.5mg  daily.  Check PT/INR in 2 weeks.  If PT/INR at goal at that time, will increased PT/INR checks to monthly

## 2011-11-28 ENCOUNTER — Telehealth: Payer: Self-pay | Admitting: *Deleted

## 2011-11-28 ENCOUNTER — Ambulatory Visit (HOSPITAL_BASED_OUTPATIENT_CLINIC_OR_DEPARTMENT_OTHER): Payer: 59 | Admitting: Pharmacist

## 2011-11-28 ENCOUNTER — Other Ambulatory Visit: Payer: 59 | Admitting: Lab

## 2011-11-28 ENCOUNTER — Other Ambulatory Visit: Payer: Self-pay | Admitting: *Deleted

## 2011-11-28 DIAGNOSIS — I2699 Other pulmonary embolism without acute cor pulmonale: Secondary | ICD-10-CM

## 2011-11-28 LAB — PROTIME-INR

## 2011-11-28 MED ORDER — VITAMIN B-6 100 MG PO TABS: 100.0000 mg | ORAL_TABLET | Freq: Two times a day (BID) | ORAL | Status: DC

## 2011-11-28 NOTE — Progress Notes (Signed)
INR slightly supratherapeutic (3.2). No problems with bleeding or bruising.  May have taken an extra dose of Coumadin last night.  She usually takes her Coumadin at 9pm and has an alarm set for that time.  She believes that she may have taken a dose at 8:30 pm, forgot about the dose, and then re-took the dose at 9pm with her alarm.  Will continue current dose of 7.5mg  daily for now, and recheck INR on 12/17/11 with her next flush appt.

## 2011-11-28 NOTE — Telephone Encounter (Signed)
Pt called stating that pt has had some cold sensitivity from fingers since pt completed chemo in December.  However,  For a couple of weeks now, pt has experienced more tingling and pain on fingertips  And toes,  Has numbness and pain more severe at night.    Pt would like to know of md's advice. Pt's  Phone     [phone].

## 2011-12-02 ENCOUNTER — Other Ambulatory Visit: Payer: Self-pay | Admitting: Pharmacist

## 2011-12-02 DIAGNOSIS — I2699 Other pulmonary embolism without acute cor pulmonale: Secondary | ICD-10-CM

## 2011-12-17 ENCOUNTER — Ambulatory Visit: Payer: 59 | Admitting: Pharmacist

## 2011-12-17 ENCOUNTER — Other Ambulatory Visit: Payer: 59 | Admitting: Lab

## 2011-12-17 ENCOUNTER — Ambulatory Visit (HOSPITAL_BASED_OUTPATIENT_CLINIC_OR_DEPARTMENT_OTHER): Payer: 59

## 2011-12-17 DIAGNOSIS — C20 Malignant neoplasm of rectum: Secondary | ICD-10-CM

## 2011-12-17 DIAGNOSIS — I2699 Other pulmonary embolism without acute cor pulmonale: Secondary | ICD-10-CM

## 2011-12-17 DIAGNOSIS — Z452 Encounter for adjustment and management of vascular access device: Secondary | ICD-10-CM

## 2011-12-17 LAB — PROTIME-INR
INR: 3.3 (ref 2.00–3.50)
Protime: 39.6 Seconds — ABNORMAL HIGH (ref 10.6–13.4)

## 2011-12-17 MED ORDER — SODIUM CHLORIDE 0.9 % IJ SOLN
10.0000 mL | INTRAMUSCULAR | Status: DC | PRN
  Administered 2011-12-17: 10 mL via INTRAVENOUS
  Filled 2011-12-17: qty 10

## 2011-12-17 MED ORDER — HEPARIN SOD (PORK) LOCK FLUSH 100 UNIT/ML IV SOLN
500.0000 [IU] | Freq: Once | INTRAVENOUS | Status: AC
  Administered 2011-12-17: 500 [IU] via INTRAVENOUS
  Filled 2011-12-17: qty 5

## 2011-12-17 NOTE — Progress Notes (Signed)
INR slightly supratherapeutic (3.3).  Since her INRs at the current dose of 7.5mg  daily are persistently at the higher end of normal, will decrease dose slightly to 7.5mg  daily except 5mg  on Tuesdays.  No other complaints.  Asked if she would be able to get a massage and microdermabrasion treatment since she has a gift certificate to a local spa with Coumadin therapy.  Advised patient that these treatments should not pose any significant harm to her other than possible local bruising.  She may want to avoid deep tissue massages, as this may have a greater risk of causing bruising.  Will recheck INR in 2 weeks.

## 2011-12-31 ENCOUNTER — Other Ambulatory Visit: Payer: 59 | Admitting: Lab

## 2011-12-31 ENCOUNTER — Ambulatory Visit (HOSPITAL_BASED_OUTPATIENT_CLINIC_OR_DEPARTMENT_OTHER): Payer: 59 | Admitting: Pharmacist

## 2011-12-31 DIAGNOSIS — I2699 Other pulmonary embolism without acute cor pulmonale: Secondary | ICD-10-CM

## 2011-12-31 LAB — PROTIME-INR: INR: 4 — ABNORMAL HIGH (ref 2.00–3.50)

## 2011-12-31 NOTE — Progress Notes (Signed)
INR supratherapeutic even with a dose decrease at last visit. Pt has not doubled doses nor changed medications.  Her diet, she says, is stable. Hold today's dose then 7.5 mg/day; 5 mg on Thurs this week. It appears she will need at least 2 days at 5 mg during the week. Repeat INR in 1 week. [name], Pharm.D.

## 2012-01-07 ENCOUNTER — Other Ambulatory Visit (HOSPITAL_BASED_OUTPATIENT_CLINIC_OR_DEPARTMENT_OTHER): Payer: 59 | Admitting: Lab

## 2012-01-07 ENCOUNTER — Ambulatory Visit (HOSPITAL_BASED_OUTPATIENT_CLINIC_OR_DEPARTMENT_OTHER): Payer: 59 | Admitting: Pharmacist

## 2012-01-07 DIAGNOSIS — I2699 Other pulmonary embolism without acute cor pulmonale: Secondary | ICD-10-CM

## 2012-01-07 DIAGNOSIS — Z7901 Long term (current) use of anticoagulants: Secondary | ICD-10-CM

## 2012-01-07 DIAGNOSIS — Z5181 Encounter for therapeutic drug level monitoring: Secondary | ICD-10-CM

## 2012-01-07 DIAGNOSIS — C20 Malignant neoplasm of rectum: Secondary | ICD-10-CM

## 2012-01-07 LAB — CBC WITH DIFFERENTIAL/PLATELET
BASO%: 0.5 % (ref 0.0–2.0)
EOS%: 1.5 % (ref 0.0–7.0)
LYMPH%: 26.2 % (ref 14.0–49.7)
MCH: 30.9 pg (ref 25.1–34.0)
MCHC: 33.9 g/dL (ref 31.5–36.0)
MCV: 91.2 fL (ref 79.5–101.0)
MONO%: 8.3 % (ref 0.0–14.0)
NEUT#: 1.3 10*3/uL — ABNORMAL LOW (ref 1.5–6.5)
Platelets: 179 10*3/uL (ref 145–400)
RBC: 3.85 10*6/uL (ref 3.70–5.45)
RDW: 12.9 % (ref 11.2–14.5)
nRBC: 0 % (ref 0–0)

## 2012-01-07 LAB — COMPREHENSIVE METABOLIC PANEL
ALT: 20 U/L (ref 0–35)
AST: 21 U/L (ref 0–37)
Albumin: 3.9 g/dL (ref 3.5–5.2)
Alkaline Phosphatase: 123 U/L — ABNORMAL HIGH (ref 39–117)
BUN: 13 mg/dL (ref 6–23)
Calcium: 9.5 mg/dL (ref 8.4–10.5)
Chloride: 108 mEq/L (ref 96–112)
Potassium: 3.8 mEq/L (ref 3.5–5.3)
Sodium: 142 mEq/L (ref 135–145)
Total Protein: 7.4 g/dL (ref 6.0–8.3)

## 2012-01-07 LAB — POCT INR: INR: 3.1

## 2012-01-07 LAB — PROTIME-INR
INR: 3.1 (ref 2.00–3.50)
Protime: 37.2 Seconds — ABNORMAL HIGH (ref 10.6–13.4)

## 2012-01-07 NOTE — Progress Notes (Signed)
INR = 3.1 today.  She just returned last night from the [place].  She went on a "celebrate life" trip w/ a girlfriend.   Her legs/feet/ankles are swollen bilaterally.  She states they worsened as she was on the airplane trip home.  She walked a lot & is tired today. She has "discomfort" in her legs/feet but there is "no pain."  She plans to have her husband massage her legs tonight as he has before when this has happened; it usually relieves the swelling. On her trip, she had "plenty of daiquiris."  This can contribute to the INR being a little above goal. In the past 2-3 days as she skipped her evening doses of Coumadin & taken them in the mornings instead. Over the last 7 days she took approx. 42.5 mg total of Coumadin.  The fact that she has had more EtOH than usual leads me to slightly increase her weekly overall dose to 45 mg. She will take 7.5 mg/day; 5 mg MWF. Return in 10 days. She knows to call us if the swelling in her legs worsens or if she develops pain in her legs. [name], Pharm.D.

## 2012-01-17 ENCOUNTER — Ambulatory Visit (HOSPITAL_BASED_OUTPATIENT_CLINIC_OR_DEPARTMENT_OTHER): Payer: 59 | Admitting: Pharmacist

## 2012-01-17 ENCOUNTER — Other Ambulatory Visit (HOSPITAL_BASED_OUTPATIENT_CLINIC_OR_DEPARTMENT_OTHER): Payer: 59 | Admitting: Lab

## 2012-01-17 DIAGNOSIS — I2699 Other pulmonary embolism without acute cor pulmonale: Secondary | ICD-10-CM

## 2012-01-17 LAB — PROTIME-INR

## 2012-01-17 NOTE — Progress Notes (Signed)
INR = 2.5 today on 7.5 mg/day; 5 mg MWF. No complaints today.  Feet/legs less swollen; back to normal size. Cont same dose. Return on 4/11 (has lab & CT sched already for that day). If INR ok on 4/11, we can see in 4 weeks. [name], Pharm.D.

## 2012-01-22 ENCOUNTER — Other Ambulatory Visit: Payer: Self-pay | Admitting: Hematology and Oncology

## 2012-01-30 ENCOUNTER — Ambulatory Visit (HOSPITAL_COMMUNITY)
Admission: RE | Admit: 2012-01-30 | Discharge: 2012-01-30 | Disposition: A | Discharge status: Home / Self Care | Payer: 59 | Source: Ambulatory Visit | Attending: Hematology and Oncology | Admitting: Hematology and Oncology

## 2012-01-30 ENCOUNTER — Other Ambulatory Visit (HOSPITAL_BASED_OUTPATIENT_CLINIC_OR_DEPARTMENT_OTHER): Payer: 59 | Admitting: Lab

## 2012-01-30 ENCOUNTER — Ambulatory Visit: Payer: 59 | Admitting: Pharmacist

## 2012-01-30 DIAGNOSIS — R911 Solitary pulmonary nodule: Secondary | ICD-10-CM | POA: Insufficient documentation

## 2012-01-30 DIAGNOSIS — K7689 Other specified diseases of liver: Secondary | ICD-10-CM | POA: Insufficient documentation

## 2012-01-30 DIAGNOSIS — Z86718 Personal history of other venous thrombosis and embolism: Secondary | ICD-10-CM | POA: Insufficient documentation

## 2012-01-30 DIAGNOSIS — I2699 Other pulmonary embolism without acute cor pulmonale: Secondary | ICD-10-CM

## 2012-01-30 DIAGNOSIS — Z9049 Acquired absence of other specified parts of digestive tract: Secondary | ICD-10-CM | POA: Insufficient documentation

## 2012-01-30 DIAGNOSIS — R109 Unspecified abdominal pain: Secondary | ICD-10-CM | POA: Insufficient documentation

## 2012-01-30 DIAGNOSIS — C20 Malignant neoplasm of rectum: Secondary | ICD-10-CM

## 2012-01-30 LAB — CMP (CANCER CENTER ONLY)
AST: 21 U/L (ref 11–38)
Alkaline Phosphatase: 117 U/L — ABNORMAL HIGH (ref 26–84)
BUN, Bld: 14 mg/dL (ref 7–22)
Creat: 0.9 mg/dl (ref 0.6–1.2)
Total Bilirubin: 0.7 mg/dl (ref 0.20–1.60)

## 2012-01-30 LAB — PROTIME-INR: INR: 2.1 (ref 2.00–3.50)

## 2012-01-30 MED ORDER — IOHEXOL 300 MG/ML  SOLN
100.0000 mL | Freq: Once | INTRAMUSCULAR | Status: AC | PRN
  Administered 2012-01-30: 100 mL via INTRAVENOUS

## 2012-01-30 NOTE — Progress Notes (Signed)
INR therapeutic (2.1).  No complaints.  No changes in meds.  She will be trying to lose some wt and exercise more since at her last visit with the endocrinologist she found that her wt, blood sugar, and cholesterol were increasing.  No significant changes regarding Vit K intake.  Continue current dose of 7.5 mg daily except 5mg  on MWF.  Recheck INR in 3 weeks.

## 2012-02-04 ENCOUNTER — Other Ambulatory Visit: Payer: Self-pay | Admitting: *Deleted

## 2012-02-04 ENCOUNTER — Ambulatory Visit (HOSPITAL_BASED_OUTPATIENT_CLINIC_OR_DEPARTMENT_OTHER): Payer: 59 | Admitting: Hematology and Oncology

## 2012-02-04 ENCOUNTER — Telehealth: Payer: Self-pay | Admitting: Hematology and Oncology

## 2012-02-04 ENCOUNTER — Encounter: Payer: Self-pay | Admitting: Hematology and Oncology

## 2012-02-04 VITALS — BP 128/85 | HR 60 | Temp 97.9°F | Ht 71.0 in | Wt 197.7 lb

## 2012-02-04 DIAGNOSIS — I2699 Other pulmonary embolism without acute cor pulmonale: Secondary | ICD-10-CM

## 2012-02-04 DIAGNOSIS — C20 Malignant neoplasm of rectum: Secondary | ICD-10-CM

## 2012-02-04 MED ORDER — WARFARIN SODIUM 5 MG PO TABS: 5.0000 mg | ORAL_TABLET | ORAL | Status: DC

## 2012-02-04 MED ORDER — SODIUM CHLORIDE 0.9 % IJ SOLN
10.0000 mL | INTRAMUSCULAR | Status: DC | PRN
  Administered 2012-02-04: 10 mL via INTRAVENOUS

## 2012-02-04 MED ORDER — HEPARIN SOD (PORK) LOCK FLUSH 100 UNIT/ML IV SOLN
500.0000 [IU] | Freq: Once | INTRAVENOUS | Status: AC
  Administered 2012-02-04: 500 [IU] via INTRAVENOUS

## 2012-02-04 NOTE — Progress Notes (Signed)
CC:   Merlene Laughter. [name], M.D. [name], M.D. [name], MD, [name], M.D. [name], M.D. Dorisann Frames, M.D.  IDENTIFYING STATEMENT:  The patient is a 46 year old woman with rectal cancer who presents for followup and review of scans.  INTERVAL HISTORY:  Mrs. 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  She describes a diminished sensation in the soles of the feet and hands.  She notes that she is now able to exercise and ambulate with very minimal difficulty.  She also notes that her bladder incontinence is improving.  She has not lost any weight.  She has no nausea, vomiting, or pain.  Denies any bleeding with Coumadin.  We reviewed results of a recent CT scan of the chest, abdomen, pelvis on 01/30/2012. There was no evidence of recurrent disease.  There was a stable right lower lobe lung nodule.  There was mild hepatosteatosis.  There were several postoperative radiation-induced changes around the rectum.  MEDICATIONS:  Reviewed and updated.  PAST MEDICAL HISTORY/FAMILY HISTORY/SOCIAL HISTORY:  Unchanged.  PHYSICAL EXAM:  General:  The patient is a well-appearing, well- nourished woman in no distress.  Vitals:  Pulse 60, blood pressure 128/85, temperature 97.9, respirations 20, weight 197.7 pounds.  HEENT: Head is atraumatic, normocephalic.  Sclerae anicteric.  Mouth moist. Neck:  Supple.  Chest:  Clear.  CVS:  First and second heart sounds present with no added sounds or murmurs.  Abdomen:  Soft, nontender. Bowel sounds present.  Extremities:  No calf tenderness.  Skin:  No bruising or bleeding.  LABORATORY DATA:  CBC on 01/07/2012:  White cell count 2.1 hemoglobin 11.9, hematocrit 35.1, platelets 179.  On 01/30/2012:  Sodium 143, potassium 4.5, chloride 103, CO2 28, BUN 13, creatinine 0.7, glucose 99. T bili 0.7, total alkaline phosphatase 117, AST 21, ALT 19.  CEA less than 0.5.  Results of CTs as in interval  history.  IMPRESSION AND PLAN:  Mrs. Roadcap is a 46 year old woman with:  1. 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  Status post radiation therapy with     continuous infusion 5-FU from 03/19/2011 through 04/30/2011.     Status post FOLFOX 6 beginning 07/03/2011 for 8 cycles.  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 2. 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  The patient is currently anticoagulated.  The patient's current exam and CTs indicate no evidence of recurrence. She is due for a colonoscopy and she will have that scheduled with Dr. Marisue Brooklyn.  At the time of colonoscopy, she was asked to hold Coumadin 5 days before the procedure and begin Lovenox dosed at 1.5 mg/kg daily. She is to hold Lovenox 24 hours prior to procedure.  If there is no indication of bleeding and adequate hemostasis has been achieved during her colonoscopy, she will resume Coumadin and Lovenox the day following the procedure.  She is return to our Anticoagulation Clinic for continued monitoring.  The patient will follow up in 4 months' time.    ______________________________ Laurice Record, M.D. LIO/MEDQ  D:  02/04/2012  T:  02/04/2012  Job:  [id number]

## 2012-02-04 NOTE — Telephone Encounter (Signed)
gv pt appt schedule for may thru dec including ct scan for 8/12.

## 2012-02-04 NOTE — Progress Notes (Signed)
This office note has been dictated.

## 2012-02-04 NOTE — Patient Instructions (Signed)
Patient to follow up as instructed.   Current Outpatient Prescriptions  Medication Sig Dispense Refill  . acetaminophen (TYLENOL) 500 MG tablet Take 1,000 mg by mouth as needed.        . [facility] VITAMIN B-6 100 MG tablet TAKE 1 TABLET (100 MG TOTAL) BY MOUTH 2 (TWO) TIMES DAILY.  60 tablet  0  . warfarin (COUMADIN) 5 MG tablet Take 5 mg by mouth daily.        Marland Kitchen DISCONTD: POTASSIUM CHLORIDE PO Take 20 mEq by mouth daily.             April 2013  Sunday Monday Tuesday Wednesday Thursday Friday Saturday      1   2   3   4   5   6    7   8   9   10   11    LAB MO     8:00 AM  (15 min.)  Windell Hummingbird  [facility]   COUMADIN CLINIC 15   8:30 AM  (15 min.)  Davene Costain [place], [place]  [facility]   CT BODY W/   9:00 AM  (30 min.)  [facility] 2  [facility] 12   13    14   15   16    EST PT 30  11:30 AM  (30 min.)  Laurice Record, MD  [facility] 17   18   19   20    21   22   23   24   25   26   27    28   29   19 Mar 2012  Sunday Monday Tuesday Wednesday Thursday Friday Saturday            1   2   LAB MO     3:30 PM  (15 min.)  Sherrie Mustache  [facility]   COUMADIN CLINIC 15   3:45 PM  (15 min.)  [facility]   [facility] [address]!   4    5   6   7   8   9   10   11    12   13   14   15   16   17   18    19   20   21   22   23   24   25    26   27   28   29   30    31

## 2012-02-04 NOTE — Progress Notes (Signed)
Addended by: Normajean Baxter LE on: 02/04/2012 12:59 PM   Modules accepted: Orders, SmartSet

## 2012-02-07 ENCOUNTER — Ambulatory Visit: Payer: Self-pay | Admitting: Pharmacist

## 2012-02-07 ENCOUNTER — Encounter: Payer: Self-pay | Admitting: Pharmacist

## 2012-02-07 DIAGNOSIS — I2699 Other pulmonary embolism without acute cor pulmonale: Secondary | ICD-10-CM

## 2012-02-07 NOTE — Progress Notes (Signed)
Call received from Dr. Kenna Gilbert office. Pt is scheduled for colonoscopy on 03/30/12 & Dr. Loreta Ave would like plan from Dr. Dalene Carrow for bridging anticoagulation around procedure.  They are requesting pt stop Coumadin for 5 days before colonoscopy. I have provided form for Dr. Dalene Carrow to complete re: bridging instructions.  I've requested that she give it back to our Coumadin clinic pharmacist. We will f/u w/ pt once Dr. Dalene Carrow provides instructions to [place]. Pt is scheduled to return for next Coumadin clinic visit on 02/20/12.  I've let Dr. Dalene Carrow know this. [name], Pharm.D.

## 2012-02-07 NOTE — Progress Notes (Signed)
Pt will be having colonoscopy on 03/30/12 w/ Dr. Ranae Palms (ph [phone]; fax [phone]; AttnMorrie Sheldon) Plan for bridging anticoag as follows (per Dr. Dalene Carrow): Check protime at [facility] on 03/25/12.  Begin holding Coumadin this day (5 days before procedure). If INR not supratherapeutic, start Lovenox 1.5 mg/kg/day.  If supratherapeutic, will need to d/w MD. Hold Lovenox 03/29/12 (24 hrs before procedure). If there is no indication of bleeding & GI feels adequate hemostasis is achieved, resume Coumadin & Lovenox 1.5 mg/kg/day on 03/31/12 (24 hours post-procedure) Return to Coumadin clinic on 04/06/12.  May d/c Lovenox when INR = 2-3.  We will need to outline this plan w/ pt when she is here on 02/20/12 in the Coumadin clinic. I have faxed plan already to Dr. Kenna Gilbert office & have scheduled appts as above. [name], Pharm.D.

## 2012-02-13 ENCOUNTER — Telehealth: Payer: Self-pay | Admitting: Nurse Practitioner

## 2012-02-13 ENCOUNTER — Encounter: Payer: Self-pay | Admitting: Gynecology

## 2012-02-13 DIAGNOSIS — E119 Type 2 diabetes mellitus without complications: Secondary | ICD-10-CM | POA: Insufficient documentation

## 2012-02-13 NOTE — Telephone Encounter (Signed)
Pt stated colonoscopy r/s to May 1st.  Pt inquiring re: when to stop coumadin and start lovenox.  02/07/12 Note from Lenon Curt, PharmD reviewed with Efraim Kaufmann, PharmD- instructed pt to come in tomorrow for PT/INR.  She will need to hold coumadin starting tomorrow.  Will call her tomorrow with instructions regarding Lovenox after PT/INR results are reviewed.  Pt verbalized understanding.

## 2012-02-14 ENCOUNTER — Other Ambulatory Visit: Payer: 59 | Admitting: Lab

## 2012-02-14 ENCOUNTER — Ambulatory Visit (HOSPITAL_BASED_OUTPATIENT_CLINIC_OR_DEPARTMENT_OTHER): Payer: 59 | Admitting: Pharmacist

## 2012-02-14 DIAGNOSIS — Z7901 Long term (current) use of anticoagulants: Secondary | ICD-10-CM

## 2012-02-14 DIAGNOSIS — I2699 Other pulmonary embolism without acute cor pulmonale: Secondary | ICD-10-CM

## 2012-02-14 LAB — POCT INR: INR: 1.9

## 2012-02-14 LAB — PROTIME-INR

## 2012-02-14 MED ORDER — ENOXAPARIN SODIUM 150 MG/ML ~~LOC~~ SOLN: 130.0000 mg | SUBCUTANEOUS | Status: DC

## 2012-02-14 NOTE — Progress Notes (Signed)
Pt came in today for protime since colonoscopy moved to 02/19/12. INR = 1.9 She'll stop Coumadin today & start Lovenox 1.5 mg/kg/day (130 mg) today & stop the Lovenox 24 hrs before colonoscopy. I gave her 2 syringes of 150 mg Lovenox & instructed her to inject 0.9 mL SQ (rotating sides of her abdomen).  She'll get RX filled for remainder of 1 box of Lovenox 150 mg syringes. Restart Lovenox & Coumadin at usual dose on 02/20/12 if ok w/ GI. Return 02/25/12 for protime.  We can d/c Lovenox when INR = 2-3. [name], Pharm.D.

## 2012-02-20 ENCOUNTER — Ambulatory Visit: Payer: 59

## 2012-02-20 ENCOUNTER — Other Ambulatory Visit (HOSPITAL_COMMUNITY)
Admission: RE | Admit: 2012-02-20 | Discharge: 2012-02-20 | Disposition: A | Discharge status: Home / Self Care | Payer: 59 | Source: Ambulatory Visit | Attending: Gynecology | Admitting: Gynecology

## 2012-02-20 ENCOUNTER — Other Ambulatory Visit: Payer: 59 | Admitting: Lab

## 2012-02-20 ENCOUNTER — Encounter: Payer: Self-pay | Admitting: Gynecology

## 2012-02-20 ENCOUNTER — Ambulatory Visit (INDEPENDENT_AMBULATORY_CARE_PROVIDER_SITE_OTHER): Payer: 59 | Admitting: Gynecology

## 2012-02-20 VITALS — BP 106/66 | Ht 71.0 in | Wt 199.0 lb

## 2012-02-20 DIAGNOSIS — Z01419 Encounter for gynecological examination (general) (routine) without abnormal findings: Secondary | ICD-10-CM | POA: Insufficient documentation

## 2012-02-20 DIAGNOSIS — N912 Amenorrhea, unspecified: Secondary | ICD-10-CM

## 2012-02-20 NOTE — Progress Notes (Signed)
[name] [birth year] [id number]        46 y.o.  for annual exam.  Several issues noted below.  Past medical history,surgical history, medications, allergies, family history and social history were all reviewed and documented in the EPIC chart. ROS:  Was performed and pertinent positives and negatives are included in the history.  Exam: [name] chaperone present Filed Vitals:   02/20/12 1119  BP: 106/66   General appearance  Normal Skin grossly normal Head/Neck normal with no cervical or supraclavicular adenopathy thyroid normal Lungs  Clear. Port-A-Cath site upper anterior right chest wall Cardiac RR, without RMG Abdominal  soft, nontender, without masses, organomegaly or hernia Breasts  examined lying and sitting without masses, retractions, discharge or axillary adenopathy. Pelvic  Ext/BUS/vagina  normal   Cervix  normal  Pap done  Uterus  anteverted, normal size, shape and contour, midline and mobile nontender   Adnexa  Without masses or tenderness    Anus and perineum  normal     Assessment/Plan:  46 y.o. female for annual exam.    1. Rectal cancer. Diagnosed 2012. Treated with excision/radiation and chemotherapy. Actively being followed by her oncologist. Rectal exam not done today due to recent follow up with them and colonoscopy. 2. Amenorrhea. Suspect secondary to chemotherapy/radiation treatment. Patient is having hot flashes. Will check [facility] for documentation. I reviewed the issues of hypoestrogenism and risks such as accelerated cardiovascular/osteoporotic. She is having hot flashes. She unfortunately underwent a pulmonary embolus this past year and is currently on Coumadin. I discussed the relative/absolute contraindication to HRT with this. Alternatives for her symptoms to include Effexor discussed.  Patient is not interested at this point but will call me if she does decide to try this.  I did review with her there is a possibility of regaining ovarian function and the need  to use contraception. She is not sexual active this point again the need to use contraception if she does become sexual active was discussed. 3. Bone health. We'll plan baseline DEXA in another several years given her premature menopause. 4. Mammography. Patient had her mammogram in December 2012. We'll continue with annual mammography. SBE monthly reviewed. 5. Health maintenance. No other blood work was done today is all done through Dr. Willeen Cass office who follows her for her diabetes and also check her lipid profile.    Dara Lords MD, 12:06 PM 02/20/2012

## 2012-02-20 NOTE — Patient Instructions (Signed)
Office will call you with your hormone result.

## 2012-02-21 ENCOUNTER — Encounter: Payer: Self-pay | Admitting: Gynecology

## 2012-02-25 ENCOUNTER — Other Ambulatory Visit (HOSPITAL_BASED_OUTPATIENT_CLINIC_OR_DEPARTMENT_OTHER): Payer: 59 | Admitting: Lab

## 2012-02-25 ENCOUNTER — Ambulatory Visit (HOSPITAL_BASED_OUTPATIENT_CLINIC_OR_DEPARTMENT_OTHER): Payer: 59 | Admitting: Pharmacist

## 2012-02-25 DIAGNOSIS — I2699 Other pulmonary embolism without acute cor pulmonale: Secondary | ICD-10-CM

## 2012-02-25 LAB — POCT INR: INR: 1.3

## 2012-02-25 LAB — PROTIME-INR
INR: 1.3 — ABNORMAL LOW (ref 2.00–3.50)
Protime: 15.6 Seconds — ABNORMAL HIGH (ref 10.6–13.4)

## 2012-02-25 NOTE — Patient Instructions (Signed)
Pt is subtherapeutic today at 1.3.  She states she missed a 7.5 mg dose on Sat.  Stopped lovenox on 5/3 per instructions from Dr. Loreta Ave.  Will increase to 10 mg today and tomorrow and resume 7.5mg  daily with 5 mg on MWF.  Recheck INR in one week on Mon, May 13 at 3:15

## 2012-02-25 NOTE — Progress Notes (Signed)
Pt is subtherapeutic today at 1.3.  She states she missed a 7.5 mg dose on Sat.  Stopped lovenox on 5/3 per instructions from Dr. Mann.  Will increase to 10 mg today and tomorrow and resume 7.5mg daily with 5 mg on MWF.  Recheck INR in one week on Mon, May 13 at 3:15 

## 2012-03-02 ENCOUNTER — Ambulatory Visit (HOSPITAL_BASED_OUTPATIENT_CLINIC_OR_DEPARTMENT_OTHER): Payer: 59 | Admitting: Pharmacist

## 2012-03-02 ENCOUNTER — Other Ambulatory Visit (HOSPITAL_BASED_OUTPATIENT_CLINIC_OR_DEPARTMENT_OTHER): Payer: 59 | Admitting: Lab

## 2012-03-02 DIAGNOSIS — I2699 Other pulmonary embolism without acute cor pulmonale: Secondary | ICD-10-CM

## 2012-03-02 LAB — PROTIME-INR
INR: 2.6 (ref 2.00–3.50)
Protime: 31.2 Seconds — ABNORMAL HIGH (ref 10.6–13.4)

## 2012-03-02 LAB — POCT INR: INR: 2.6

## 2012-03-02 NOTE — Progress Notes (Signed)
INR = 2.6 Pt reports she took doses as instructed.  She has no complaints to report. New drug: Ultra Fora IB (probiotic; no interference w/ Coumadin) I will change pts overall dose so she receives 47.5 mg/week --> 7.5 mg/day; 5 mg Mon & Fri. Return in 10 days. [name], Pharm.D.

## 2012-03-12 ENCOUNTER — Other Ambulatory Visit: Payer: 59 | Admitting: Lab

## 2012-03-12 ENCOUNTER — Ambulatory Visit (HOSPITAL_BASED_OUTPATIENT_CLINIC_OR_DEPARTMENT_OTHER): Payer: 59

## 2012-03-12 ENCOUNTER — Ambulatory Visit: Payer: 59

## 2012-03-12 ENCOUNTER — Other Ambulatory Visit (HOSPITAL_BASED_OUTPATIENT_CLINIC_OR_DEPARTMENT_OTHER): Payer: 59 | Admitting: Lab

## 2012-03-12 ENCOUNTER — Ambulatory Visit (HOSPITAL_BASED_OUTPATIENT_CLINIC_OR_DEPARTMENT_OTHER): Payer: 59 | Admitting: Pharmacist

## 2012-03-12 VITALS — BP 131/81 | HR 69 | Temp 98.3°F

## 2012-03-12 DIAGNOSIS — I2699 Other pulmonary embolism without acute cor pulmonale: Secondary | ICD-10-CM

## 2012-03-12 DIAGNOSIS — C20 Malignant neoplasm of rectum: Secondary | ICD-10-CM

## 2012-03-12 DIAGNOSIS — Z452 Encounter for adjustment and management of vascular access device: Secondary | ICD-10-CM

## 2012-03-12 LAB — PROTIME-INR: Protime: 21.6 Seconds — ABNORMAL HIGH (ref 10.6–13.4)

## 2012-03-12 LAB — POCT INR: INR: 1.8

## 2012-03-12 MED ORDER — SODIUM CHLORIDE 0.9 % IJ SOLN
10.0000 mL | INTRAMUSCULAR | Status: DC | PRN
  Administered 2012-03-12: 10 mL via INTRAVENOUS
  Filled 2012-03-12: qty 10

## 2012-03-12 MED ORDER — HEPARIN SOD (PORK) LOCK FLUSH 100 UNIT/ML IV SOLN
500.0000 [IU] | Freq: Once | INTRAVENOUS | Status: AC
  Administered 2012-03-12: 500 [IU] via INTRAVENOUS
  Filled 2012-03-12: qty 5

## 2012-03-12 NOTE — Progress Notes (Signed)
Has been taking Excedrin for migraines 2tabs daily.  APAP does not help.  Will cont to monitor use of Excedrin.  Take 10mg  today, then take 7.5mg  daily with 5 mg on M & F.   Recheck INR 04/02/12 at 3pm.

## 2012-03-12 NOTE — Patient Instructions (Signed)
If you have problems, call the MD.

## 2012-03-19 ENCOUNTER — Ambulatory Visit: Payer: 59 | Admitting: *Deleted

## 2012-03-19 ENCOUNTER — Telehealth: Payer: Self-pay | Admitting: *Deleted

## 2012-03-19 DIAGNOSIS — N390 Urinary tract infection, site not specified: Secondary | ICD-10-CM

## 2012-03-19 NOTE — Telephone Encounter (Signed)
Pt calling c/o lower abnormal and back discomfort this am, has always had urine frequency, no urgency, no foul odor or cloudy urine. Pt said her last UTI felt like this, last OV 02/20/12. Okay to drop off u/a or OV? Please advise

## 2012-03-19 NOTE — Telephone Encounter (Signed)
Drop-off UA okay. Although if normal then I will recommend office visit

## 2012-03-19 NOTE — Telephone Encounter (Signed)
Pt informed with the below note, order in computer. 

## 2012-03-20 LAB — URINALYSIS W MICROSCOPIC + REFLEX CULTURE
Crystals: NONE SEEN
Glucose, UA: NEGATIVE mg/dL
Squamous Epithelial / LPF: NONE SEEN
pH: 5.5 (ref 5.0–8.0)

## 2012-03-21 LAB — URINE CULTURE: Colony Count: 75000

## 2012-03-23 ENCOUNTER — Other Ambulatory Visit: Payer: Self-pay | Admitting: Gynecology

## 2012-03-23 DIAGNOSIS — R319 Hematuria, unspecified: Secondary | ICD-10-CM

## 2012-03-24 ENCOUNTER — Other Ambulatory Visit: Payer: 59

## 2012-03-24 DIAGNOSIS — N390 Urinary tract infection, site not specified: Secondary | ICD-10-CM

## 2012-03-25 ENCOUNTER — Other Ambulatory Visit: Payer: 59

## 2012-03-25 ENCOUNTER — Ambulatory Visit: Payer: 59

## 2012-03-25 LAB — URINALYSIS W MICROSCOPIC + REFLEX CULTURE
Casts: NONE SEEN
Crystals: NONE SEEN
Glucose, UA: NEGATIVE mg/dL
Leukocytes, UA: NEGATIVE
Specific Gravity, Urine: 1.024 (ref 1.005–1.030)
Squamous Epithelial / LPF: NONE SEEN
pH: 5.5 (ref 5.0–8.0)

## 2012-04-01 ENCOUNTER — Ambulatory Visit (INDEPENDENT_AMBULATORY_CARE_PROVIDER_SITE_OTHER): Payer: 59 | Admitting: Internal Medicine

## 2012-04-01 ENCOUNTER — Encounter: Payer: Self-pay | Admitting: Internal Medicine

## 2012-04-01 VITALS — BP 112/78 | HR 68 | Temp 98.5°F | Ht 71.0 in | Wt 201.4 lb

## 2012-04-01 DIAGNOSIS — I2699 Other pulmonary embolism without acute cor pulmonale: Secondary | ICD-10-CM

## 2012-04-01 NOTE — Patient Instructions (Addendum)
You have now been on coumadin for 1 year and approximately 3 months Please continue coumadin till august 2013 when Dr Dalene Carrow decides on removal of Hickman catheter In August 2013, if hickman removed and no cancer recurrence I would still recommend continued anti-coagulation for another 1-2 years   - Continued anticoagulation is recommended in order to prevent recurrence  - we can consider rivoraxaban drug for your convenience at that time I will dicuss all this with Dr Dalene Carrow and get back to you to decide followup

## 2012-04-01 NOTE — Progress Notes (Addendum)
Subjective:    Patient ID: [name], female    DOB: January 17, 1966, 46 y.o.   MRN: [id number]  HPI OV 01/21/2011: 46 year old overweight female who has a sedentary Sales promotion account executive job with [employer]. Never smoker. Had been on oral contraceptives since 2005.  Past 1 year - longer work hours (10-12 hour days). Since Jan 2012 noticed non specific bilateral lower extremity aches. Then, new diagnosis of diabetes end feb 2012 (exercise and diet regimen). Developed acute  rt lower chest and rt upper shoulder pleuritic pain on 12/21/2010 but became excruciating on 12/22/2010. Did not have associated dyspnea, hemoptysis, pedal edema In ER d-dimer high. CT chest showed bilateral lowe lobe PE but she was not hypoxemic. Had normal antithrombin III, protein C and protein S levels and negative facto 5 def  per dc summary. Denies increased sedentary levels or recent travel prior to PE. Rx with anticoagulation. Mammogram end 2010 normal per hx. Pap smear Jan 2012 normal per hx. Improved pain at discharge. However, still has some residual right sided chest discomfort that is mild, and brought on by lot of body movements. INR being tracked by PA of Dr. [name]. On Friday 01/18/2011 INR 3.3.  However, around 2 weeks ago noticed rectal bleeding while on coumadin Rx. Mixed with stools and notices it at end and when she wipes bottom. So saw Dr. Loreta Ave on 01/18/2011 - hgb unchanged per Dr. Loreta Ave verbal report. Denies straining or constipation, abdominal pain, nausea, vomit, diarrhea. Currently on lovenox shots for possible colonoscopy  OV 04/01/2011: Followup. Since last visit, diagnosed with COLON CANCER. Had resection by Dr. Abbey Chatters. No liver mets per Dr. Abbey Chatters converstation. Lymph node mets +. Now chemo and xrt by Dr. Dalene Carrow due to complete on Feb 2012. Has new rt Hickman. Had retrievable IVC filter at time of surgery but subseuqntly could not be removed due to clot burden. She is feeling fine now. Denies dyspnea, edema,  chest pain, hemoptysis. Feels well. Handling chemo/XRT well physically and emotionally. States family coping okay. Wants to know if IVC Filter can be removed and length of coumadin Rx. Coumadin monitoring by Dr. Dalene Carrow and INR most recently ws 3 per hx  REC Glad you are doing okay  You should be on coumadin minimum 1 year or till cancer is cured and catheter is out - whichever is longer  I will see you back in March 2013, till then continue coumadin with Dr. Dalene Carrow monitoring coumadin  The filter stays  When I see you back we can discuss about continued coumadin which you likely need   OV 04/01/2012  Followup PE in setting of colon cancer . Has completed 13-14 months of coumadin and 12 months since diagnosis of cancer   Doing well. Has finished chemo. Cancer currently under remission.  No problems with coumadin. Dr Dalene Carrow plans repeat ct surveillance in august and then if under remission dc coumadin and ivc filter. Shje is not too keen on continued coumadin but if recommended is okay taking it  Past, Family, Social reviewed: no change since last visit   Review of Systems  Constitutional: Negative.  Negative for fever and unexpected weight change.  HENT: Negative for ear pain, nosebleeds, congestion, sore throat, rhinorrhea, sneezing, trouble swallowing, dental problem, postnasal drip and sinus pressure.   Eyes: Negative.  Negative for redness and itching.  Respiratory: Negative.  Negative for cough, chest tightness, shortness of breath and wheezing.   Cardiovascular: Negative.  Negative for palpitations and leg swelling.  Gastrointestinal: Negative.  Negative for nausea and vomiting.  Genitourinary: Negative.  Negative for dysuria.  Musculoskeletal: Negative.  Negative for joint swelling.  Skin: Negative for rash.  Neurological: Positive for headaches.       Treated with medication  Hematological: Bruises/bleeds easily.       Pt currently taking coumadin  Psychiatric/Behavioral:  Negative for dysphoric mood. The patient is not nervous/anxious.        Objective:   Physical Exam Vitals reviewed. Constitutional: She is oriented to person, place, and time. She appears well-developed and well-nourished. No distress.      HENT:  Head: Normocephalic and atraumatic.  Right Ear: External ear normal.  Left Ear: External ear normal.  Mouth/Throat: Oropharynx is clear and moist. No oropharyngeal exudate.  Eyes: Conjunctivae and EOM are normal. Pupils are equal, round, and reactive to light. Right eye exhibits no discharge. Left eye exhibits no discharge. No scleral icterus.  Neck: Normal range of motion. Neck supple. No JVD present. No tracheal deviation present. No thyromegaly present.  Cardiovascular: Normal rate, regular rhythm, normal heart sounds and intact distal pulses.  Exam reveals no gallop and no friction rub.   No murmur heard. Pulmonary/Chest: Effort normal and breath sounds normal. No respiratory distress. She has no wheezes. She has no rales. She exhibits no tenderness.       Rt hickman +  Abdominal: Soft. Bowel sounds are normal. She exhibits no distension and no mass. There is no tenderness. There is no rebound and no guarding.  Musculoskeletal: Normal range of motion. She exhibits no edema and no tenderness.  Lymphadenopathy:    She has no cervical adenopathy.  Neurological: She is alert and oriented to person, place, and time. She has normal reflexes. No cranial nerve deficit. She exhibits normal muscle tone. Coordination normal.  Skin: Skin is warm and dry. No rash noted. She is not diaphoretic. No erythema. No pallor.  Psychiatric: She has a normal mood and affect. Her behavior is normal. Judgment and thought content normal.          Assessment & Plan:

## 2012-04-02 ENCOUNTER — Ambulatory Visit (HOSPITAL_BASED_OUTPATIENT_CLINIC_OR_DEPARTMENT_OTHER): Payer: 59 | Admitting: Pharmacist

## 2012-04-02 ENCOUNTER — Other Ambulatory Visit (HOSPITAL_BASED_OUTPATIENT_CLINIC_OR_DEPARTMENT_OTHER): Payer: 59 | Admitting: Lab

## 2012-04-02 DIAGNOSIS — I2699 Other pulmonary embolism without acute cor pulmonale: Secondary | ICD-10-CM

## 2012-04-02 LAB — POCT INR: INR: 3.6

## 2012-04-02 NOTE — Progress Notes (Signed)
INR = 3.6 on 7.5 mg/day; 5 mg M/F No obvious cause for slightly elevated INR.   Rectal bleeding has been continued problem.  No heavier this week than it usually is, per pt. Pt saw Dr. Marchelle Gearing yesterday for f/u PE.  His recommendation is that pt remain on Coumadin at least 1-2 years more.  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 She is aware of Xarelto but currently weighing the pros & cons.  It seems she is leaning toward remaining on Coumadin. Given today's INR, I will have pt take only 1/2 tab (2.5 mg) today then back to her previous dose.  She has had therapeutic INR on this dose before today. Return in 3 weeks w/ port flush. [name], Pharm.D.

## 2012-04-06 ENCOUNTER — Other Ambulatory Visit: Payer: 59 | Admitting: Lab

## 2012-04-06 ENCOUNTER — Ambulatory Visit: Payer: 59

## 2012-04-19 NOTE — Assessment & Plan Note (Addendum)
You have now been on coumadin for 1 year and approximately 3 months Please continue coumadin till august 2013 when Dr Dalene Carrow decides on removal of Hickman catheter In August 2013, if hickman removed and no cancer recurrence I would still recommend continued anti-coagulation for another 1-2 years   - Continued anticoagulation is recommended in order to prevent recurrence  - we can consider rivoraxaban drug for your convenience at that time I will dicuss all this with Dr Dalene Carrow and get back to you to decide followup   > 50% of this 15 min visit spent in face to face counseling

## 2012-04-20 ENCOUNTER — Telehealth: Payer: Self-pay | Admitting: Internal Medicine

## 2012-04-20 NOTE — Telephone Encounter (Signed)
Reminder to call patient

## 2012-04-22 ENCOUNTER — Ambulatory Visit (HOSPITAL_BASED_OUTPATIENT_CLINIC_OR_DEPARTMENT_OTHER): Payer: 59

## 2012-04-22 ENCOUNTER — Ambulatory Visit: Payer: 59 | Admitting: Pharmacist

## 2012-04-22 ENCOUNTER — Other Ambulatory Visit (HOSPITAL_BASED_OUTPATIENT_CLINIC_OR_DEPARTMENT_OTHER): Payer: 59 | Admitting: Lab

## 2012-04-22 VITALS — BP 123/79 | HR 60 | Temp 98.3°F

## 2012-04-22 DIAGNOSIS — C20 Malignant neoplasm of rectum: Secondary | ICD-10-CM

## 2012-04-22 DIAGNOSIS — Z7901 Long term (current) use of anticoagulants: Secondary | ICD-10-CM

## 2012-04-22 DIAGNOSIS — Z5181 Encounter for therapeutic drug level monitoring: Secondary | ICD-10-CM

## 2012-04-22 DIAGNOSIS — I2699 Other pulmonary embolism without acute cor pulmonale: Secondary | ICD-10-CM

## 2012-04-22 DIAGNOSIS — Z452 Encounter for adjustment and management of vascular access device: Secondary | ICD-10-CM

## 2012-04-22 LAB — PROTIME-INR
INR: 3.9 — ABNORMAL HIGH (ref 2.00–3.50)
Protime: 46.8 Seconds — ABNORMAL HIGH (ref 10.6–13.4)

## 2012-04-22 MED ORDER — SODIUM CHLORIDE 0.9 % IJ SOLN
10.0000 mL | INTRAMUSCULAR | Status: DC | PRN
  Administered 2012-04-22: 10 mL via INTRAVENOUS
  Filled 2012-04-22: qty 10

## 2012-04-22 MED ORDER — HEPARIN SOD (PORK) LOCK FLUSH 100 UNIT/ML IV SOLN
500.0000 [IU] | Freq: Once | INTRAVENOUS | Status: AC
  Administered 2012-04-22: 500 [IU] via INTRAVENOUS
  Filled 2012-04-22: qty 5

## 2012-04-22 NOTE — Progress Notes (Signed)
INR = 3.9 on 7.5 mg/day; 5 mg Mon/Fri. Pt had 5 glasses of wine on Fri PM (6/30) for "girls night out."  She may have doubled her dose of Coumadin one evening last weekend also. No unusual bleeding/bruising.  INR elevated.  Likely due to EtOH & doubled dose. Will decrease her dose to 5 mg/day; 7.5 mg Tu/Sat/Sun. Repeat INR in 1 week. [name], Pharm.D.

## 2012-04-30 ENCOUNTER — Other Ambulatory Visit (HOSPITAL_BASED_OUTPATIENT_CLINIC_OR_DEPARTMENT_OTHER): Payer: 59 | Admitting: Lab

## 2012-04-30 ENCOUNTER — Ambulatory Visit (HOSPITAL_BASED_OUTPATIENT_CLINIC_OR_DEPARTMENT_OTHER): Payer: 59 | Admitting: Pharmacist

## 2012-04-30 DIAGNOSIS — I2699 Other pulmonary embolism without acute cor pulmonale: Secondary | ICD-10-CM

## 2012-04-30 DIAGNOSIS — Z7901 Long term (current) use of anticoagulants: Secondary | ICD-10-CM

## 2012-04-30 LAB — PROTIME-INR: Protime: 27.6 Seconds — ABNORMAL HIGH (ref 10.6–13.4)

## 2012-04-30 NOTE — Progress Notes (Signed)
INR therapeutic today (2.3) after slight dose decrease last week. No complaints of bleeding or bruising.  No changes in meds or diet.  Will continue current dose of 5mg  daily except 7.5mg  on Tu/Sa/Su.  Recheck INR in 10 days to ensure that pt remains in therapeutic range.

## 2012-05-11 ENCOUNTER — Other Ambulatory Visit (HOSPITAL_BASED_OUTPATIENT_CLINIC_OR_DEPARTMENT_OTHER): Payer: 59 | Admitting: Lab

## 2012-05-11 ENCOUNTER — Ambulatory Visit (HOSPITAL_BASED_OUTPATIENT_CLINIC_OR_DEPARTMENT_OTHER): Payer: 59 | Admitting: Pharmacist

## 2012-05-11 DIAGNOSIS — I2699 Other pulmonary embolism without acute cor pulmonale: Secondary | ICD-10-CM

## 2012-05-11 LAB — PROTIME-INR
INR: 3 (ref 2.00–3.50)
Protime: 36 Seconds — ABNORMAL HIGH (ref 10.6–13.4)

## 2012-05-11 NOTE — Progress Notes (Signed)
INR = 3 on 5 mg/day except 7.5 mg on Tu/Sat/Sun Pt missed a dose of Coumadin on Fri PM but she took that dose on Sat AM instead. No complaints today. INR at goal. Cont same dose.  Return in 3 weeks w/ sched lab at 4 pm.  Pt due for scan that day at 5 pm. [name], Pharm.D.

## 2012-05-30 ENCOUNTER — Other Ambulatory Visit: Payer: Self-pay | Admitting: Hematology and Oncology

## 2012-06-01 ENCOUNTER — Ambulatory Visit (HOSPITAL_COMMUNITY)
Admission: RE | Admit: 2012-06-01 | Discharge: 2012-06-01 | Disposition: A | Discharge status: Home / Self Care | Payer: 59 | Source: Ambulatory Visit | Attending: Hematology and Oncology | Admitting: Hematology and Oncology

## 2012-06-01 ENCOUNTER — Other Ambulatory Visit (HOSPITAL_BASED_OUTPATIENT_CLINIC_OR_DEPARTMENT_OTHER): Payer: 59 | Admitting: Lab

## 2012-06-01 ENCOUNTER — Ambulatory Visit (HOSPITAL_BASED_OUTPATIENT_CLINIC_OR_DEPARTMENT_OTHER): Payer: 59 | Admitting: Pharmacist

## 2012-06-01 DIAGNOSIS — R911 Solitary pulmonary nodule: Secondary | ICD-10-CM | POA: Insufficient documentation

## 2012-06-01 DIAGNOSIS — Z86711 Personal history of pulmonary embolism: Secondary | ICD-10-CM | POA: Insufficient documentation

## 2012-06-01 DIAGNOSIS — I2699 Other pulmonary embolism without acute cor pulmonale: Secondary | ICD-10-CM

## 2012-06-01 DIAGNOSIS — N200 Calculus of kidney: Secondary | ICD-10-CM | POA: Insufficient documentation

## 2012-06-01 DIAGNOSIS — C20 Malignant neoplasm of rectum: Secondary | ICD-10-CM

## 2012-06-01 DIAGNOSIS — K7689 Other specified diseases of liver: Secondary | ICD-10-CM | POA: Insufficient documentation

## 2012-06-01 LAB — CBC WITH DIFFERENTIAL/PLATELET
Eosinophils Absolute: 0 10*3/uL (ref 0.0–0.5)
HGB: 12.3 g/dL (ref 11.6–15.9)
MONO#: 0.2 10*3/uL (ref 0.1–0.9)
NEUT#: 1.4 10*3/uL — ABNORMAL LOW (ref 1.5–6.5)
Platelets: 191 10*3/uL (ref 145–400)
RBC: 3.97 10*6/uL (ref 3.70–5.45)
RDW: 13.1 % (ref 11.2–14.5)
WBC: 2.6 10*3/uL — ABNORMAL LOW (ref 3.9–10.3)

## 2012-06-01 LAB — PROTIME-INR
INR: 1.9 — ABNORMAL LOW (ref 2.00–3.50)
Protime: 22.8 Seconds — ABNORMAL HIGH (ref 10.6–13.4)

## 2012-06-01 LAB — CMP (CANCER CENTER ONLY)
ALT(SGPT): 30 U/L (ref 10–47)
BUN, Bld: 14 mg/dL (ref 7–22)
CO2: 30 mEq/L (ref 18–33)
Creat: 1 mg/dl (ref 0.6–1.2)
Total Bilirubin: 0.6 mg/dl (ref 0.20–1.60)

## 2012-06-01 LAB — CEA: CEA: <0.5 ng/mL (ref 0.0–5.0)

## 2012-06-01 LAB — POCT INR: INR: 1.9

## 2012-06-01 MED ORDER — IOHEXOL 300 MG/ML  SOLN
100.0000 mL | Freq: Once | INTRAMUSCULAR | Status: AC | PRN
  Administered 2012-06-01: 100 mL via INTRAVENOUS

## 2012-06-01 NOTE — Progress Notes (Signed)
INR slightly below goal. This likely due to a missed dose on 05/30/12. Continue Coumadin 5 mg/day; 7.5 mg Tu/Sat/Sun   Recheck INR in 2 weeks to insure INR returns to therapeutic range and evaluate the effect of Herbalife on INR, if any. Pt has been on the supplement for ~ 1 week. Recheck INR 06/18/12 at 4 pm for lab, 4:15 pm for Coumadin Clinic.

## 2012-06-01 NOTE — Patient Instructions (Signed)
Continue Coumadin 5 mg/day; 7.5 mg Tu/Sat/Sun   Recheck INR 06/18/12 at 4 pm for lab, 4:15 pm for Coumadin Clinic.

## 2012-06-05 ENCOUNTER — Ambulatory Visit (HOSPITAL_BASED_OUTPATIENT_CLINIC_OR_DEPARTMENT_OTHER): Payer: 59 | Admitting: Hematology and Oncology

## 2012-06-05 ENCOUNTER — Telehealth: Payer: Self-pay | Admitting: Hematology and Oncology

## 2012-06-05 ENCOUNTER — Encounter: Payer: Self-pay | Admitting: Hematology and Oncology

## 2012-06-05 VITALS — BP 119/90 | HR 67 | Temp 98.1°F | Resp 20 | Ht 71.0 in | Wt 195.9 lb

## 2012-06-05 DIAGNOSIS — I2699 Other pulmonary embolism without acute cor pulmonale: Secondary | ICD-10-CM

## 2012-06-05 DIAGNOSIS — C20 Malignant neoplasm of rectum: Secondary | ICD-10-CM

## 2012-06-05 NOTE — Patient Instructions (Addendum)
[name]  [id number]  [facility] Discharge Instructions  RECOMMENDATIONS MADE BY THE CONSULTANT AND ANY TEST RESULTS WILL BE SENT TO YOUR REFERRING DOCTOR.   EXAM FINDINGS BY MD TODAY AND SIGNS AND SYMPTOMS TO REPORT TO CLINIC OR PRIMARY MD:   Your current list of medications are: Current Outpatient Prescriptions  Medication Sig Dispense Refill  . acetaminophen (TYLENOL) 500 MG tablet Take 1,000 mg by mouth as needed.        Marland Kitchen aspirin-acetaminophen-caffeine (EXCEDRIN MIGRAINE) 250-250-65 MG per tablet Take 2 tablets by mouth every 6 (six) hours as needed.      . CVS VITAMIN B-6 100 MG tablet TAKE 1 TABLET (100 MG TOTAL) BY MOUTH 2 (TWO) TIMES DAILY.  60 tablet  0  . UNABLE TO FIND Take 2 tablets by mouth once. Med Name: Herbalife Diet Supplement      . warfarin (COUMADIN) 5 MG tablet TAKE 1 TABLET BY MOUTH AS DIRECTED  60 tablet  1  . DISCONTD: POTASSIUM CHLORIDE PO Take 20 mEq by mouth daily.          INSTRUCTIONS GIVEN AND DISCUSSED:   SPECIAL INSTRUCTIONS/FOLLOW-UP:  See above.  I acknowledge that I have been informed and understand all the instructions given to me and received a copy. I do not have any more questions at this time, but understand that I may call the [facility] at [phone] during business hours should I have any further questions or need assistance in obtaining follow-up care.

## 2012-06-05 NOTE — Progress Notes (Signed)
This office note has been dictated.

## 2012-06-05 NOTE — Telephone Encounter (Signed)
As made and printed for ptm aom

## 2012-06-08 NOTE — Progress Notes (Signed)
CC:   Merlene Laughter. [name], M.D. Kalman Shan, MD Jyothi Elsie Amis, MD, [facility] Dorisann Frames, M.D.  IDENTIFYING STATEMENT:  The patient is a 46 year old woman with rectal cancer who presents for followup.  INTERVAL HISTORY:  Mrs. Bugh continues to do well.  She has no issues or concerns.  Her weight is stable.  She is attempting to lose weight.  She has no nausea, vomiting or change in bowel function.  She informs me that she received a colonoscopy in April of 2013 that was unremarkable.  I do not have those reports to review.  CT scan of the chest, abdomen and pelvis on 06/01/2012 showed no evidence of metastatic disease in the chest, abdomen and pelvis.  There is no evidence of venous thrombosis.  MEDICATIONS:  Reviewed and updated.  ALLERGIES:  Penicillin.  PAST MEDICAL HISTORY/FAMILY HISTORY/SOCIAL HISTORY:  Unchanged.  REVIEW OF SYSTEMS:  10 point review of systems negative.  PHYSICAL EXAM:  General:  The patient is a well-appearing, well- nourished woman in no distress.  Vitals:  Pulse 67, blood pressure 119/79, temperature 98.1, respirations 20, weight 195 pounds.  HEENT: Head is atraumatic, normocephalic.  Sclerae anicteric.  Mouth moist. Neck:  Supple.  Chest:  Clear.  Port:  No signs of infection.  CVS: First and second heart sounds present.  No added sounds or murmurs. Abdomen:  Soft, nontender.  Bowel sounds present.  Extremities:  No edema.  No calf tenderness.  Lymph nodes:  No adenopathy.  CNS: Nonfocal.  LAB DATA:  06/01/2012 white count 2.6, hemoglobin 12.3, hematocrit 36.9, platelets 191.  Sodium 143, potassium 4.3, chloride 100, CO2 30, BUN 14, creatinine 1, glucose 100, bilirubin 0.6, alkaline phosphatase 112, AST 21, ALT 30, calcium 9.8.  CEA less than 0.5.  IMPRESSION AND PLAN:  Ms. 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  Status post radiation therapy with continuous infusion 5-FU from 03/19/2011 through 04/30/2011.  Status post FOLFOX 6 beginning 07/03/2011 for 8 cycles.  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  She is on anticoagulation.  She will consider Xarelto and will discuss this with Dr. Marchelle Gearing.  The patient will keep a port in for the time being.  Her exam and CT showed no evidence of recurrence.  Followup visit will move up 6 months. She will received CT and CEA level at that visit.    ______________________________ Laurice Record, M.D. LIO/MEDQ  D:  06/05/2012  T:  06/05/2012  Job:  [id number]

## 2012-06-18 ENCOUNTER — Other Ambulatory Visit: Payer: Self-pay | Admitting: *Deleted

## 2012-06-18 ENCOUNTER — Ambulatory Visit (HOSPITAL_BASED_OUTPATIENT_CLINIC_OR_DEPARTMENT_OTHER): Payer: 59 | Admitting: Pharmacist

## 2012-06-18 ENCOUNTER — Other Ambulatory Visit (HOSPITAL_BASED_OUTPATIENT_CLINIC_OR_DEPARTMENT_OTHER): Payer: 59 | Admitting: Lab

## 2012-06-18 DIAGNOSIS — I2699 Other pulmonary embolism without acute cor pulmonale: Secondary | ICD-10-CM

## 2012-06-18 LAB — PROTIME-INR: INR: 3.1 (ref 2.00–3.50)

## 2012-06-18 NOTE — Progress Notes (Signed)
INR supratherapeutic today (3.1) on 5mg  daily except 7.5mg  on TuSaSu No changes in meds or diet.  Has been taking Herbalife shakes BID, capsules BID, and tea ~1x/week for over 2 weeks now.  Pt brought these Herbalife products to clinic so that we can review them for possible interactions with her Coumadin.  The shake product contains standard nutrition shake ingredients supplemented with vitamins and minerals.  It does not include vitamin K in its ingredients.  Informed pt that use of the shake product should not pose an interaction.    However, the capsule and tea contained multiple ingredients that could increase her INR or decrease her platelet function and increase risk of bleeding, including: guarana extract, orange pekoe tea, alpha lipoic acid, aloe vera concentrate, shittake mushroom, pomegranate rind extract, Rhodiola root extract, pine bark extract, and resveratrol.  Patient agreed that it was best to stop use of these 2 products now that she knows it can interact with her Coumadin.  INR likely supratherapeutic today r/t use of the Herbalife capsules and tea. Will have pt continue current dose of Coumadin, and recheck her INR in 2 weeks since she intends to take herself off the capsules and tea.

## 2012-07-02 ENCOUNTER — Ambulatory Visit (HOSPITAL_BASED_OUTPATIENT_CLINIC_OR_DEPARTMENT_OTHER): Payer: 59 | Admitting: Pharmacist

## 2012-07-02 ENCOUNTER — Other Ambulatory Visit: Payer: 59 | Admitting: Lab

## 2012-07-02 ENCOUNTER — Ambulatory Visit: Payer: 59

## 2012-07-02 DIAGNOSIS — I2699 Other pulmonary embolism without acute cor pulmonale: Secondary | ICD-10-CM

## 2012-07-02 LAB — PROTIME-INR
INR: 2.8 (ref 2.00–3.50)
Protime: 33.6 Seconds — ABNORMAL HIGH (ref 10.6–13.4)

## 2012-07-02 NOTE — Patient Instructions (Signed)
Continue Coumadin 5 mg/day except 7.5 mg on Tu/Sat/Sun.   Recheck INR 07/23/12 at 4 pm for lab, 4:15 pm for Coumadin Clinic.

## 2012-07-02 NOTE — Progress Notes (Signed)
Continue Coumadin 5 mg/day except 7.5 mg on Tu/Sat/Sun.   Recheck INR 07/23/12 at 4 pm for lab, 4:15 pm for Coumadin Clinic. 

## 2012-07-15 ENCOUNTER — Ambulatory Visit (HOSPITAL_BASED_OUTPATIENT_CLINIC_OR_DEPARTMENT_OTHER): Payer: 59

## 2012-07-15 VITALS — BP 124/75 | HR 65 | Temp 97.9°F

## 2012-07-15 DIAGNOSIS — C20 Malignant neoplasm of rectum: Secondary | ICD-10-CM

## 2012-07-15 DIAGNOSIS — Z452 Encounter for adjustment and management of vascular access device: Secondary | ICD-10-CM

## 2012-07-15 MED ORDER — HEPARIN SOD (PORK) LOCK FLUSH 100 UNIT/ML IV SOLN
500.0000 [IU] | Freq: Once | INTRAVENOUS | Status: AC
  Administered 2012-07-15: 500 [IU] via INTRAVENOUS
  Filled 2012-07-15: qty 5

## 2012-07-15 MED ORDER — SODIUM CHLORIDE 0.9 % IJ SOLN
10.0000 mL | INTRAMUSCULAR | Status: DC | PRN
Start: 2012-07-15 — End: 2012-07-15
  Administered 2012-07-15: 10 mL via INTRAVENOUS
  Filled 2012-07-15: qty 10

## 2012-07-15 NOTE — Patient Instructions (Signed)
Call MD for problems 

## 2012-07-23 ENCOUNTER — Ambulatory Visit (HOSPITAL_BASED_OUTPATIENT_CLINIC_OR_DEPARTMENT_OTHER): Payer: 59 | Admitting: Pharmacist

## 2012-07-23 ENCOUNTER — Other Ambulatory Visit (HOSPITAL_BASED_OUTPATIENT_CLINIC_OR_DEPARTMENT_OTHER): Payer: 59 | Admitting: Lab

## 2012-07-23 DIAGNOSIS — I2699 Other pulmonary embolism without acute cor pulmonale: Secondary | ICD-10-CM

## 2012-07-23 DIAGNOSIS — Z7901 Long term (current) use of anticoagulants: Secondary | ICD-10-CM

## 2012-07-23 LAB — PROTIME-INR
INR: 4.3 — ABNORMAL HIGH (ref 2.00–3.50)
Protime: 51.6 Seconds — ABNORMAL HIGH (ref 10.6–13.4)

## 2012-07-23 LAB — POCT INR: INR: 4.3

## 2012-07-23 NOTE — Progress Notes (Signed)
INR above goal of 2-3.  Pt started metformin and simvastatin about 2 weeks ago.  Simvastatin has the potential to interact with Coumadin increasing the INR.  Instructed pt to hold coumadin today and decrease dose to 5mg  daily.  Will check PT/INR in 1 week.

## 2012-07-30 ENCOUNTER — Ambulatory Visit (HOSPITAL_BASED_OUTPATIENT_CLINIC_OR_DEPARTMENT_OTHER): Payer: 59 | Admitting: Pharmacist

## 2012-07-30 ENCOUNTER — Ambulatory Visit (HOSPITAL_BASED_OUTPATIENT_CLINIC_OR_DEPARTMENT_OTHER): Payer: 59 | Admitting: Lab

## 2012-07-30 DIAGNOSIS — Z7901 Long term (current) use of anticoagulants: Secondary | ICD-10-CM

## 2012-07-30 DIAGNOSIS — Z5181 Encounter for therapeutic drug level monitoring: Secondary | ICD-10-CM

## 2012-07-30 DIAGNOSIS — I2699 Other pulmonary embolism without acute cor pulmonale: Secondary | ICD-10-CM

## 2012-07-30 LAB — PROTIME-INR: Protime: 32.4 Seconds — ABNORMAL HIGH (ref 10.6–13.4)

## 2012-07-30 NOTE — Progress Notes (Signed)
Continue Coumadin 5mg daily. Recheck PT/INR on 08/13/12 at 3:45pm for lab and 4:00pm for coumadin clinic. 

## 2012-07-30 NOTE — Patient Instructions (Signed)
Continue Coumadin 5mg  daily. Recheck PT/INR on 08/13/12 at 3:45pm for lab and 4:00pm for coumadin clinic.

## 2012-08-07 ENCOUNTER — Encounter (HOSPITAL_COMMUNITY): Payer: Self-pay

## 2012-08-07 ENCOUNTER — Emergency Department (HOSPITAL_COMMUNITY)
Admission: EM | Admit: 2012-08-07 | Discharge: 2012-08-07 | Disposition: A | Discharge status: Home / Self Care | Payer: 59 | Attending: Emergency Medicine | Admitting: Emergency Medicine

## 2012-08-07 ENCOUNTER — Emergency Department (HOSPITAL_COMMUNITY): Payer: 59

## 2012-08-07 DIAGNOSIS — Z7901 Long term (current) use of anticoagulants: Secondary | ICD-10-CM | POA: Insufficient documentation

## 2012-08-07 DIAGNOSIS — Z86711 Personal history of pulmonary embolism: Secondary | ICD-10-CM | POA: Insufficient documentation

## 2012-08-07 DIAGNOSIS — Z85038 Personal history of other malignant neoplasm of large intestine: Secondary | ICD-10-CM | POA: Insufficient documentation

## 2012-08-07 DIAGNOSIS — M549 Dorsalgia, unspecified: Secondary | ICD-10-CM | POA: Insufficient documentation

## 2012-08-07 DIAGNOSIS — Z79899 Other long term (current) drug therapy: Secondary | ICD-10-CM | POA: Insufficient documentation

## 2012-08-07 DIAGNOSIS — E119 Type 2 diabetes mellitus without complications: Secondary | ICD-10-CM | POA: Insufficient documentation

## 2012-08-07 HISTORY — DX: Malignant neoplasm of rectosigmoid junction: C19

## 2012-08-07 LAB — COMPREHENSIVE METABOLIC PANEL
ALT: 21 U/L (ref 0–35)
BUN: 17 mg/dL (ref 6–23)
CO2: 28 mEq/L (ref 19–32)
Calcium: 9.4 mg/dL (ref 8.4–10.5)
GFR calc Af Amer: >90 mL/min (ref >90)
GFR calc non Af Amer: >90 mL/min (ref >90)
Glucose, Bld: 90 mg/dL (ref 70–99)
Sodium: 142 mEq/L (ref 135–145)

## 2012-08-07 LAB — CBC WITH DIFFERENTIAL/PLATELET
Basophils Absolute: 0 10*3/uL (ref 0.0–0.1)
Basophils Relative: 0 % (ref 0–1)
Eosinophils Absolute: 0.1 10*3/uL (ref 0.0–0.7)
Eosinophils Relative: 2 % (ref 0–5)
Lymphs Abs: 1.1 10*3/uL (ref 0.7–4.0)
MCH: 30.3 pg (ref 26.0–34.0)
MCHC: 33.1 g/dL (ref 30.0–36.0)
MCV: 91.4 fL (ref 78.0–100.0)
Neutrophils Relative %: 52 % (ref 43–77)
Platelets: 166 10*3/uL (ref 150–400)
RDW: 13.4 % (ref 11.5–15.5)

## 2012-08-07 LAB — D-DIMER, QUANTITATIVE: D-Dimer, Quant: <0.27 ug/mL-FEU (ref 0.00–0.48)

## 2012-08-07 LAB — PROTIME-INR: Prothrombin Time: 27 seconds — ABNORMAL HIGH (ref 11.6–15.2)

## 2012-08-07 NOTE — ED Notes (Signed)
Pt presents with sudden onset of L upper back pain while at work this morning.  Pt reports pain is constant, worsens with certain movements and does not radiate.  Pt has h/o bilateral PEs, is on coumadin and has filter in place.

## 2012-08-07 NOTE — ED Provider Notes (Signed)
History     CSN: [id number]  Arrival date & time 08/07/12  1201   First MD Initiated Contact with Patient 08/07/12 1459      Chief Complaint  Patient presents with  . Back Pain    (Consider location/radiation/quality/duration/timing/severity/associated sxs/prior treatment) Patient is a 46 y.o. female presenting with back pain. The history is provided by the patient.  Back Pain  Pertinent negatives include no chest pain, no numbness, no headaches, no abdominal pain, no dysuria and no weakness.   patient presented with left upper back pain began this morning while at work. Is constant it is worse with deep breaths and certain movements. She has has a history of bilateral PEs and is on Coumadin with Greenfield filter. This is wearing her that it could be another PE. No hemoptysis. No dyspnea. No fevers. No cough. The pain is sharp  Past Medical History  Diagnosis Date  . Hyperlipidemia   . Pulmonary embolism   . Cancer     rectal ca dx'd 2012  . STD (sexually transmitted disease) 1991    POS GC-TREATED  . Diabetes mellitus     TYPE 2/diet controlled  . Abnormal FSH level 02/2012    value 53  . Cancer of colon with rectum     Past Surgical History  Procedure Date  . None   . Rectal tumor by proctotomy excision   . Portacath placement 02/21/11    tip in mid [id number]-rosenbower  . Cesarean section 2005    Family History  Problem Relation Age of Onset  . Heart disease Father   . Lung cancer Mother   . Brain cancer Mother   . Cancer Mother     lung and brain  . Diabetes Mother   . Hypertension Mother   . Cervical cancer Paternal Grandmother   . Cancer Paternal Grandmother     CERVICAL CANCER    History  Substance Use Topics  . Smoking status: Never Smoker   . Smokeless tobacco: Never Used  . Alcohol Use: Yes     occasional    OB History    Grav Para Term Preterm Abortions TAB SAB Ect Mult Living   6 3   3     3       Review of Systems  Constitutional:  Negative for activity change and appetite change.  HENT: Negative for neck stiffness.   Eyes: Negative for pain.  Respiratory: Negative for chest tightness and shortness of breath.   Cardiovascular: Negative for chest pain and leg swelling.  Gastrointestinal: Negative for nausea, vomiting, abdominal pain and diarrhea.  Genitourinary: Negative for dysuria and flank pain.  Musculoskeletal: Positive for back pain.  Skin: Negative for rash.  Neurological: Negative for weakness, numbness and headaches.  Psychiatric/Behavioral: Negative for behavioral problems.    Allergies  Penicillins  Home Medications   Current Outpatient Rx  Name Route Sig Dispense Refill  . ASPIRIN-ACETAMINOPHEN-CAFFEINE 250-250-65 MG PO TABS Oral Take 2 tablets by mouth every 6 (six) hours as needed. For migraines    . METFORMIN HCL ER 500 MG PO TB24 Oral Take 500 mg by mouth daily.    Marland Kitchen VITAMIN B-6 100 MG PO TABS Oral Take 100 mg by mouth 2 (two) times daily.    Marland Kitchen SIMVASTATIN 10 MG PO TABS Oral Take 10 mg by mouth at bedtime.    . WARFARIN SODIUM 5 MG PO TABS Oral Take 5 mg by mouth daily.      BP 123/72  Pulse  68  Temp 98 F (36.7 C)  Resp 12  Ht 5\' 11"  (1.803 m)  Wt 195 lb (88.451 kg)  BMI 27.20 kg/m2  SpO2 100%  Physical Exam  Nursing note and vitals reviewed. Constitutional: She is oriented to person, place, and time. She appears well-developed and well-nourished.  HENT:  Head: Normocephalic and atraumatic.  Eyes: EOM are normal. Pupils are equal, round, and reactive to light.  Neck: Normal range of motion. Neck supple.  Cardiovascular: Normal rate, regular rhythm and normal heart sounds.   No murmur heard. Pulmonary/Chest: Effort normal and breath sounds normal. No respiratory distress. She has no wheezes. She has no rales. She exhibits tenderness.       Mild tenderness to left back medial and slightly inferior to the scapula. No rash.  Abdominal: Soft. Bowel sounds are normal. She exhibits no  distension. There is no tenderness. There is no rebound and no guarding.  Musculoskeletal: Normal range of motion.  Neurological: She is alert and oriented to person, place, and time. No cranial nerve deficit.  Skin: Skin is warm and dry.  Psychiatric: She has a normal mood and affect. Her speech is normal.    ED Course  Procedures (including critical care time)  Labs Reviewed  CBC WITH DIFFERENTIAL - Abnormal; Notable for the following:    WBC 3.1 (*)     RBC 3.83 (*)     Hemoglobin 11.6 (*)     HCT 35.0 (*)     Neutro Abs 1.6 (*)     All other components within normal limits  PROTIME-INR - Abnormal; Notable for the following:    Prothrombin Time 27.0 (*)     INR 2.65 (*)     All other components within normal limits  APTT - Abnormal; Notable for the following:    aPTT 46 (*)     All other components within normal limits  D-DIMER, QUANTITATIVE  COMPREHENSIVE METABOLIC PANEL   Dg Chest 2 View  08/07/2012  *RADIOLOGY REPORT*  Clinical Data: Left upper chest and back pain.  CHEST - 2 VIEW  Comparison: 02/21/2011.  Findings:  Right sided Port-A-Cath with tip in the mid superior vena cava. The heart, mediastinum and hilar contours are normal. The lungs are clear.  There is no evidence of effusions or pneumothoraces.  There are no acute bony changes.  IMPRESSION: No active disease.   Original Report Authenticated By: Mervin Hack, M.D.      1. Back pain       MDM  Patient with left-sided back pain. Worse with deep breaths. She is previous history of PEs and is on Coumadin and has a Greenfield filter. X-rays reassuring. Lab work is reassuring including a therapeutic INR and a negative d-dimer. I doubt cardiac cause to this. I also doubt pulmonary embolism, however she is already on Coumadin and her as a filter so a small pulmonary embolism would do no change in therapy. Patient will be discharged home        Juliet Rude. Rubin Payor, MD 08/07/12 [phone]

## 2012-08-13 ENCOUNTER — Other Ambulatory Visit (HOSPITAL_BASED_OUTPATIENT_CLINIC_OR_DEPARTMENT_OTHER): Payer: 59 | Admitting: Lab

## 2012-08-13 ENCOUNTER — Ambulatory Visit (HOSPITAL_BASED_OUTPATIENT_CLINIC_OR_DEPARTMENT_OTHER): Payer: 59 | Admitting: Pharmacist

## 2012-08-13 DIAGNOSIS — I2699 Other pulmonary embolism without acute cor pulmonale: Secondary | ICD-10-CM

## 2012-08-13 LAB — PROTIME-INR: Protime: 24 Seconds — ABNORMAL HIGH (ref 10.6–13.4)

## 2012-08-13 NOTE — Progress Notes (Signed)
INR = 2 on 5 mg/day Doing well.  No luck losing extra weight. INR at goal.  No change to dose. Repeat INR in 3 weeks. [name], Pharm.D.

## 2012-08-26 ENCOUNTER — Ambulatory Visit (HOSPITAL_BASED_OUTPATIENT_CLINIC_OR_DEPARTMENT_OTHER): Payer: 59

## 2012-08-26 VITALS — BP 123/81 | HR 66 | Temp 98.3°F

## 2012-08-26 DIAGNOSIS — Z452 Encounter for adjustment and management of vascular access device: Secondary | ICD-10-CM

## 2012-08-26 DIAGNOSIS — C20 Malignant neoplasm of rectum: Secondary | ICD-10-CM

## 2012-08-26 MED ORDER — SODIUM CHLORIDE 0.9 % IJ SOLN
10.0000 mL | INTRAMUSCULAR | Status: DC | PRN
  Administered 2012-08-26: 10 mL via INTRAVENOUS
  Filled 2012-08-26: qty 10

## 2012-08-26 MED ORDER — HEPARIN SOD (PORK) LOCK FLUSH 100 UNIT/ML IV SOLN
500.0000 [IU] | Freq: Once | INTRAVENOUS | Status: AC
  Administered 2012-08-26: 500 [IU] via INTRAVENOUS
  Filled 2012-08-26: qty 5

## 2012-08-26 NOTE — Patient Instructions (Signed)
Call MD for problems 

## 2012-08-30 ENCOUNTER — Other Ambulatory Visit: Payer: Self-pay | Admitting: Hematology and Oncology

## 2012-09-07 ENCOUNTER — Ambulatory Visit: Payer: 59 | Admitting: Pharmacist

## 2012-09-07 ENCOUNTER — Other Ambulatory Visit (HOSPITAL_BASED_OUTPATIENT_CLINIC_OR_DEPARTMENT_OTHER): Payer: 59 | Admitting: Lab

## 2012-09-07 DIAGNOSIS — I2699 Other pulmonary embolism without acute cor pulmonale: Secondary | ICD-10-CM

## 2012-09-07 LAB — PROTIME-INR

## 2012-09-07 LAB — POCT INR: INR: 2.1

## 2012-09-07 NOTE — Progress Notes (Signed)
Patient's INR today is within goal at 2.1. She reports no bleeding or bruising and notes eating a consistent diet.  She has been between 2-3 on the same total weekly dose of 35mg  in the past.  Will continue current regimen of 5mg  daily and follow up INR in about 3 weeks on December 9th, 2013.  Thank you, Franchot Erichsen, Pharm.D. Clinical Pharmacist   09/07/2012 4:21 PM

## 2012-09-28 ENCOUNTER — Ambulatory Visit (HOSPITAL_BASED_OUTPATIENT_CLINIC_OR_DEPARTMENT_OTHER): Payer: 59 | Admitting: Pharmacist

## 2012-09-28 ENCOUNTER — Other Ambulatory Visit (HOSPITAL_BASED_OUTPATIENT_CLINIC_OR_DEPARTMENT_OTHER): Payer: 59 | Admitting: Lab

## 2012-09-28 DIAGNOSIS — Z7901 Long term (current) use of anticoagulants: Secondary | ICD-10-CM

## 2012-09-28 DIAGNOSIS — I2699 Other pulmonary embolism without acute cor pulmonale: Secondary | ICD-10-CM

## 2012-09-28 LAB — PROTIME-INR: INR: 2.2 (ref 2.00–3.50)

## 2012-09-28 LAB — POCT INR: INR: 2.2

## 2012-09-28 NOTE — Progress Notes (Signed)
No changes.  INR at goal.  Continue Coumadin 5mg  daily.  Check PT/INR in 1 month.

## 2012-10-01 NOTE — Telephone Encounter (Signed)
[name]  Can you get the patient to come in please. First avail is fine  In June 2013: plan about anticoag was dependent on her visit with Dr Dalene Carrow in Aug 2013 and dc if cancer free. But in Aug 2013: Dr Dalene Carrow did not dc coumadin though cancer free. She made  A note that patient would talk about xarelto with me but patient never came back to see me. She has now been on coumadin x 18 months and not sure if she needs continued anticocagulation. Please set visit to discuss  Thanks  MR

## 2012-10-02 NOTE — Telephone Encounter (Signed)
appt set for 10-28-11 at 4:15. Carron Curie, CMA

## 2012-10-07 ENCOUNTER — Ambulatory Visit (HOSPITAL_BASED_OUTPATIENT_CLINIC_OR_DEPARTMENT_OTHER): Payer: 59

## 2012-10-07 VITALS — BP 134/78 | HR 69 | Temp 98.2°F

## 2012-10-07 DIAGNOSIS — Z452 Encounter for adjustment and management of vascular access device: Secondary | ICD-10-CM

## 2012-10-07 DIAGNOSIS — C20 Malignant neoplasm of rectum: Secondary | ICD-10-CM

## 2012-10-07 DIAGNOSIS — C189 Malignant neoplasm of colon, unspecified: Secondary | ICD-10-CM

## 2012-10-07 MED ORDER — HEPARIN SOD (PORK) LOCK FLUSH 100 UNIT/ML IV SOLN
500.0000 [IU] | Freq: Once | INTRAVENOUS | Status: AC
  Administered 2012-10-07: 500 [IU] via INTRAVENOUS
  Filled 2012-10-07: qty 5

## 2012-10-07 MED ORDER — SODIUM CHLORIDE 0.9 % IJ SOLN
10.0000 mL | INTRAMUSCULAR | Status: DC | PRN
  Administered 2012-10-07: 10 mL via INTRAVENOUS
  Filled 2012-10-07: qty 10

## 2012-10-16 ENCOUNTER — Other Ambulatory Visit: Payer: Self-pay | Admitting: Hematology and Oncology

## 2012-10-24 ENCOUNTER — Encounter: Payer: Self-pay | Admitting: Oncology

## 2012-10-24 ENCOUNTER — Telehealth: Payer: Self-pay | Admitting: Oncology

## 2012-10-24 NOTE — Telephone Encounter (Signed)
s/w pt and she is aware of being reassign to dr Thayer Ohm [facility] and letter mailed     anne 1.4/14

## 2012-10-27 ENCOUNTER — Encounter: Payer: Self-pay | Admitting: Internal Medicine

## 2012-10-27 ENCOUNTER — Ambulatory Visit (INDEPENDENT_AMBULATORY_CARE_PROVIDER_SITE_OTHER): Payer: 59 | Admitting: Internal Medicine

## 2012-10-27 VITALS — BP 116/78 | HR 60 | Temp 97.8°F | Ht 71.0 in | Wt 209.4 lb

## 2012-10-27 DIAGNOSIS — I2699 Other pulmonary embolism without acute cor pulmonale: Secondary | ICD-10-CM

## 2012-10-27 NOTE — Patient Instructions (Addendum)
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 This should be done within days of coming off coumadin  - After this start daily baby aspirin 81mg  to prevent blood clot recurrence  - You should never ever take contraceptives again due to risk for blood clot  - You should take breaks every 1-2h at work while off coumadin and move your legs and keep yourself hydrated; otherwise sitting at 8h on desks is  A risk factor for clot - For all travel > 5-8h, call our office for xarelto 10mg  tablet prophylaxis or do frequent breaks, moving legs  #FOllowup  - call us after feb 2014 CT chest

## 2012-10-27 NOTE — Progress Notes (Signed)
Subjective:    Patient ID: [name], female    DOB: [birth year], 47 y.o.   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 Never smoker. Had been on oral contraceptives since 2005.  Past 1 year - longer work hours (10-12 hour days). Since Jan 2012 noticed non specific bilateral lower extremity aches. Then, new diagnosis of diabetes end feb 2012 (exercise and diet regimen). Developed acute  rt lower chest and rt upper shoulder pleuritic pain on 12/21/2010 but became excruciating on 12/22/2010. Did not have associated dyspnea, hemoptysis, pedal edema In ER d-dimer high. CT chest showed bilateral lowe lobe PE but she was not hypoxemic. Had normal antithrombin III, protein C and protein S levels and negative facto 5 def  per dc summary. Denies increased sedentary levels or recent travel prior to PE. Rx with anticoagulation. Mammogram end 2010 normal per hx. Pap smear Jan 2012 normal per hx. Improved pain at discharge. However, still has some residual right sided chest discomfort that is mild, and brought on by lot of body movements. INR being tracked by PA of Dr. [name]. On Friday 01/18/2011 INR 3.3.  However, around 2 weeks ago noticed rectal bleeding while on coumadin Rx. Mixed with stools and notices it at end and when she wipes bottom. So saw Dr. Loreta Ave on 01/18/2011 - hgb unchanged per Dr. Loreta Ave verbal report. Denies straining or constipation, abdominal pain, nausea, vomit, diarrhea. Currently on lovenox shots for possible colonoscopy  OV 04/01/2011: Followup. Since last visit, diagnosed with COLON CANCER. Had resection by Dr. Abbey Chatters. No liver mets per Dr. Abbey Chatters converstation. Lymph node mets +. Now chemo and xrt by Dr. Dalene Carrow due to complete on Feb 2012. Has new rt Hickman. Had retrievable IVC filter at time of surgery but subseuqntly could not be removed due to clot burden. She is feeling fine now. Denies dyspnea, edema,  chest pain, hemoptysis. Feels well. Handling chemo/XRT well physically and emotionally. States family coping okay. Wants to know if IVC Filter can be removed and length of coumadin Rx. Coumadin monitoring by Dr. Dalene Carrow and INR most recently ws 3 per hx  REC Glad you are doing okay  You should be on coumadin minimum 1 year or till cancer is cured and catheter is out - whichever is longer  I will see you back in March 2013, till then continue coumadin with Dr. Dalene Carrow monitoring coumadin  The filter stays  When I see you back we can discuss about continued coumadin which you likely need   OV 04/01/2012  Followup PE in setting of colon cancer . Has completed 13-14 months of coumadin and 12 months since diagnosis of cancer   Doing well. Has finished chemo. Cancer currently under remission.  No problems with coumadin. Dr Dalene Carrow plans repeat ct surveillance in august and then if under remission dc coumadin and ivc filter. Shje is not too keen on continued coumadin but if recommended is okay taking it  Past, Family, Social reviewed: no change since last visit   REC You have now been on coumadin for 1 year and approximately 3 months  Please continue coumadin till august 2013 when Dr Dalene Carrow decides on removal of Hickman catheter  In August 2013, if hickman removed and no cancer recurrence I would still recommend continued anti-coagulation for another 1-2 years  - Continued anticoagulation is recommended in order to prevent recurrence  - we can consider rivoraxaban drug for  your convenience at that time  I will dicuss all this with Dr Dalene Carrow and get back to you to decide followup   OV 10/27/2012  Followup PE in setting of colon cancer, desk job 8h sitting a day and oral contraceptive.   She has now  completed PE Rx since March 2012 and diagnosis of colon CA in April-May 2013. She still is on coumadin. She has been doing well. She had ER visit 08/07/12 with chest pain. D-dimer was < 0.27 and  discharged as muscle spasm. She has been compliant with coumadin with therapeutic INR. She is now completing 21-22 months of coumadin Rx. Clinically she is cancer free. Her oncologist Dr Dalene Carrow has left the practice. Per patient plan was to repeat CT in FEb 2014 and ensure 2 year remission and remover right portocath. Her new oncologist will be Dr Truett Perna  She denies edema, chest pain, hemoptysis, dyspnea.   Of note, she stil continues to be at desk job sitting non stop 8h without getting up   Review of Systems  Constitutional: Negative for fever and unexpected weight change.  HENT: Negative for ear pain, nosebleeds, congestion, sore throat, rhinorrhea, sneezing, trouble swallowing, dental problem, postnasal drip and sinus pressure.   Eyes: Negative for redness and itching.  Respiratory: Negative for cough, chest tightness, shortness of breath and wheezing.   Cardiovascular: Negative for palpitations and leg swelling.  Gastrointestinal: Negative for nausea and vomiting.  Genitourinary: Negative for dysuria.  Musculoskeletal: Negative for joint swelling.  Skin: Negative for rash.  Neurological: Negative for headaches.  Hematological: Does not bruise/bleed easily.  Psychiatric/Behavioral: Negative for dysphoric mood. The patient is not nervous/anxious.   Past, Family, Social reviewed: no change since last visit      Objective:   Physical Exam Vitals reviewed. Constitutional: She is oriented to person, place, and time. She appears well-developed and well-nourished. No distress.      HENT:  Head: Normocephalic and atraumatic.  Right Ear: External ear normal.  Left Ear: External ear normal.  Mouth/Throat: Oropharynx is clear and moist. No oropharyngeal exudate.  Eyes: Conjunctivae and EOM are normal. Pupils are equal, round, and reactive to light. Right eye exhibits no discharge. Left eye exhibits no discharge. No scleral icterus.  Neck: Normal range of motion. Neck supple. No JVD  present. No tracheal deviation present. No thyromegaly present.  Cardiovascular: Normal rate, regular rhythm, normal heart sounds and intact distal pulses.  Exam reveals no gallop and no friction rub.   No murmur heard. Pulmonary/Chest: Effort normal and breath sounds normal. No respiratory distress. She has no wheezes. She has no rales. She exhibits no tenderness.       Rt hickman +  Abdominal: Soft. Bowel sounds are normal. She exhibits no distension and no mass. There is no tenderness. There is no rebound and no guarding.  Musculoskeletal: Normal range of motion. She exhibits no edema and no tenderness.  Lymphadenopathy:    She has no cervical adenopathy.  Neurological: She is alert and oriented to person, place, and time. She has normal reflexes. No cranial nerve deficit. She exhibits normal muscle tone. Coordination normal.  Skin: Skin is warm and dry. No rash noted. She is not diaphoretic. No erythema. No pallor.  Psychiatric: She has a normal mood and affect. Her behavior is normal. Judgment and thought content normal.            Assessment & Plan:

## 2012-10-27 NOTE — Assessment & Plan Note (Signed)
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 This should be done within days of coming off coumadin  - After this start daily baby aspirin 81mg  to prevent blood clot recurrence  - You should never ever take contraceptives again due to risk for blood clot  - You should take breaks every 1-2h at work while off coumadin and move your legs and keep yourself hydrated; otherwise sitting at 8h on desks is  A risk factor for clot - For all travel > 5-8h, call our office for xarelto 10mg  tablet prophylaxis or do frequent breaks, moving legs  #FOllowup  - call us after feb 2014 CT chest   > 50% of this 15 min visit spent in face to face counseling

## 2012-11-02 ENCOUNTER — Other Ambulatory Visit: Payer: 59 | Admitting: Lab

## 2012-11-02 ENCOUNTER — Ambulatory Visit (HOSPITAL_BASED_OUTPATIENT_CLINIC_OR_DEPARTMENT_OTHER): Payer: 59 | Admitting: Pharmacist

## 2012-11-02 DIAGNOSIS — I2699 Other pulmonary embolism without acute cor pulmonale: Secondary | ICD-10-CM

## 2012-11-02 DIAGNOSIS — Z7901 Long term (current) use of anticoagulants: Secondary | ICD-10-CM

## 2012-11-03 ENCOUNTER — Ambulatory Visit: Payer: 59

## 2012-11-03 NOTE — Progress Notes (Signed)
INR below goal today. This is likely secondary to recently missing a dose and also a large increase in her Vitamin K intake while on a fast. She is eating only vegetables (a lot of broccoli, some string beans, carrots, etc). No kale. Will increase coumadin to 5mg  daily except 7.5mg  on Mon&Fri. Recheck PT/INR on 11/11/12 at 3:30 pm for lab and 3:45 pm for coumadin clinic. Will need to adjust coumadin dose as needed when she stops the fast.  She is scheduled to stop the fast ~ 11/11/12 She has been very stable on 5mg  daily October 2013. Pt may possibly stop coumadin in Feb. Dr. Jane Canary note from 10/27/12 reviews his plan for possibly stopping Coumadin for patient. His instructions are copied below. - if followup CT in Feb 2014 shows no recurrence of cancer, then you can come off coumadin  -After coming off coumadin (not before) have Dr Truett Perna remove portocath per their prior plan. This should be done within days of coming off coumadin  - After this start daily baby aspirin 81mg  to prevent blood clot recurrence

## 2012-11-03 NOTE — Patient Instructions (Signed)
Increase coumadin to 5mg  daily except 7.5mg  on Mon&Fri. Recheck PT/INR on 11/11/12 at 3:30 pm for lab and 3:45 pm for coumadin clinic.

## 2012-11-11 ENCOUNTER — Ambulatory Visit (HOSPITAL_BASED_OUTPATIENT_CLINIC_OR_DEPARTMENT_OTHER): Payer: 59 | Admitting: Pharmacist

## 2012-11-11 ENCOUNTER — Other Ambulatory Visit (HOSPITAL_BASED_OUTPATIENT_CLINIC_OR_DEPARTMENT_OTHER): Payer: 59 | Admitting: Lab

## 2012-11-11 DIAGNOSIS — I2699 Other pulmonary embolism without acute cor pulmonale: Secondary | ICD-10-CM

## 2012-11-11 LAB — PROTIME-INR: INR: 2.5 (ref 2.00–3.50)

## 2012-11-11 NOTE — Progress Notes (Signed)
INR at goal.  Today is last day of fast, which pt only ate fruits and vegetables.  Will decrease Coumadin dose back to 5mg  daily since pt will be resuming diet previous to fast and had been stable on this Coumadin dose.  Will check PT/INR on 12/03/12.  Pt has appt with Dr. Truett Perna on 2/17 and will decide if can stop coumadin.

## 2012-11-30 ENCOUNTER — Other Ambulatory Visit: Payer: Self-pay | Admitting: Hematology and Oncology

## 2012-12-03 ENCOUNTER — Telehealth: Payer: Self-pay | Admitting: Pharmacist

## 2012-12-03 ENCOUNTER — Ambulatory Visit (HOSPITAL_COMMUNITY): Admission: RE | Admit: 2012-12-03 | Payer: 59 | Source: Ambulatory Visit

## 2012-12-03 ENCOUNTER — Other Ambulatory Visit: Payer: 59 | Admitting: Lab

## 2012-12-03 ENCOUNTER — Ambulatory Visit: Payer: 59

## 2012-12-03 NOTE — Telephone Encounter (Signed)
Called pt to cancel lab/cc appt due to dangerous driving weather.  Pt reports no problems r/t anticoagulation.  She did have INR drawn at PCP's office a couple weeks ago and reports her INR there was 1.7.  Since she also has to r/s her CT appt that was scheduled today, she will call us back tomorrow when she knows her new CT appt.  She would like our lab/CC appt to coincide with her CT scan.

## 2012-12-03 NOTE — Telephone Encounter (Signed)
Pt called with new CT appt, and would like to r/s her lab/CC appt to coincide with this appt.  She will come in on 12/07/12 at 0815 for lab, 0830 for CC, and will go to CT at 0900.

## 2012-12-04 ENCOUNTER — Telehealth: Payer: Self-pay | Admitting: Oncology

## 2012-12-04 NOTE — Telephone Encounter (Signed)
Pt called and returned call pt has r/s CT to 2/17 0800 and MD visit is 2/17 12noon

## 2012-12-07 ENCOUNTER — Ambulatory Visit (HOSPITAL_COMMUNITY)
Admission: RE | Admit: 2012-12-07 | Discharge: 2012-12-07 | Disposition: A | Discharge status: Home / Self Care | Payer: 59 | Source: Ambulatory Visit | Attending: Hematology and Oncology | Admitting: Hematology and Oncology

## 2012-12-07 ENCOUNTER — Ambulatory Visit (HOSPITAL_BASED_OUTPATIENT_CLINIC_OR_DEPARTMENT_OTHER): Payer: 59 | Admitting: Oncology

## 2012-12-07 ENCOUNTER — Encounter (HOSPITAL_COMMUNITY): Payer: Self-pay

## 2012-12-07 ENCOUNTER — Other Ambulatory Visit (HOSPITAL_BASED_OUTPATIENT_CLINIC_OR_DEPARTMENT_OTHER): Payer: 59 | Admitting: Lab

## 2012-12-07 ENCOUNTER — Ambulatory Visit: Payer: 59 | Admitting: Pharmacist

## 2012-12-07 ENCOUNTER — Telehealth: Payer: Self-pay | Admitting: Oncology

## 2012-12-07 VITALS — BP 126/75 | HR 70 | Temp 98.2°F | Resp 18 | Ht 71.0 in | Wt 208.0 lb

## 2012-12-07 DIAGNOSIS — I2699 Other pulmonary embolism without acute cor pulmonale: Secondary | ICD-10-CM

## 2012-12-07 DIAGNOSIS — R911 Solitary pulmonary nodule: Secondary | ICD-10-CM | POA: Insufficient documentation

## 2012-12-07 DIAGNOSIS — C20 Malignant neoplasm of rectum: Secondary | ICD-10-CM

## 2012-12-07 DIAGNOSIS — E119 Type 2 diabetes mellitus without complications: Secondary | ICD-10-CM

## 2012-12-07 LAB — CEA: CEA: <0.5 ng/mL (ref 0.0–5.0)

## 2012-12-07 LAB — CBC WITH DIFFERENTIAL/PLATELET
Eosinophils Absolute: 0 10*3/uL (ref 0.0–0.5)
MONO#: 0.2 10*3/uL (ref 0.1–0.9)
NEUT#: 1.5 10*3/uL (ref 1.5–6.5)
RBC: 4.23 10*6/uL (ref 3.70–5.45)
RDW: 13.3 % (ref 11.2–14.5)
WBC: 2.5 10*3/uL — ABNORMAL LOW (ref 3.9–10.3)
lymph#: 0.8 10*3/uL — ABNORMAL LOW (ref 0.9–3.3)

## 2012-12-07 LAB — COMPREHENSIVE METABOLIC PANEL (CC13)
ALT: 22 U/L (ref 0–55)
CO2: 23 mEq/L (ref 22–29)
Calcium: 9.5 mg/dL (ref 8.4–10.4)
Chloride: 106 mEq/L (ref 98–107)
Potassium: 4.2 mEq/L (ref 3.5–5.1)
Sodium: 140 mEq/L (ref 136–145)
Total Bilirubin: 0.41 mg/dL (ref 0.20–1.20)
Total Protein: 7.3 g/dL (ref 6.4–8.3)

## 2012-12-07 LAB — PROTIME-INR: Protime: 25.2 Seconds — ABNORMAL HIGH (ref 10.6–13.4)

## 2012-12-07 MED ORDER — IOHEXOL 300 MG/ML  SOLN
100.0000 mL | Freq: Once | INTRAMUSCULAR | Status: AC | PRN
  Administered 2012-12-07: 100 mL via INTRAVENOUS

## 2012-12-07 NOTE — Progress Notes (Signed)
INR = 2.1 on 5 mg/day No complaints re: anticoagulation. No recent missed doses. INR at goal.  No change necessary to Coumadin dose. Return in 1 month. [name], Pharm.D.

## 2012-12-07 NOTE — Telephone Encounter (Signed)
Gave pt appt for lab and MD for August 2014, pt will see Dr. Abbey Chatters on 12/16/12

## 2012-12-07 NOTE — Progress Notes (Signed)
   [facility]    OFFICE PROGRESS NOTE   INTERVAL HISTORY:   She has been followed by Dr. Dalene Carrow with a history of rectal cancer. She reports feeling well. She has occasional rectal bleeding when she has frequent bowel movements. This was present prior to undergoing a colonoscopy last year. She continues Coumadin anticoagulation after being diagnosed with a pulmonary embolism in 2012. Dr. Marchelle Gearing has recommended discontinuing Coumadin after the Port-A-Cath is removed. 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  Objective:  Vital signs in last 24 hours:  Blood pressure 126/75, pulse 70, temperature 98.2 F (36.8 C), temperature source Oral, resp. rate 18, height 5\' 11"  (1.803 m), weight 208 lb (94.348 kg).    HEENT: Neck without mass Lymphatics: No cervical, supraclavicular, axillary, or inguinal nodes Resp: Lungs clear bilaterally Cardio: Regular rate and rhythm GI: No hepatomegaly, no mass, midline scar without evidence of recurrent tumor Vascular: No leg edema  Portacath/PICC-without erythema  Lab Results:  Lab Results  Component Value Date   WBC 2.5* 12/07/2012   HGB 12.8 12/07/2012   HCT 37.8 12/07/2012   MCV 89.4 12/07/2012   PLT 165 12/07/2012   ANC 1.5  PT/INR 2.1  X-rays: CTs of the chest, abdomen, and pelvis on 12/07/2012-no evidence of metastatic disease in the chest no focal abnormality in the liver or spleen. IVC filter noted, no free fluid or lymphadenopathy abdomen. Abnormal presacral soft tissue attenuation is unchanged. No evidence for recurrent disease.   Medications: I have reviewed the patient's current medications.  Assessment/Plan:  1. Rectal cancer, stage III (T3 N1b), status post a low anterior resection 01/31/2011, status post radiation with continuous infusion 5-FU 03/19/2011 through 04/30/2011. Status post FOLFOX beginning 07/03/2011 for 8 cycles.  2. Pulmonary embolism March 2012, status post  IVC filter placement prior to cancer surgery in April 2012, followed by Dr. Marchelle Gearing. Risk factors for pulmonary embolism felt to be related to obesity, oral contraceptive use, rectal cancer, and sedentary job. Dr. Marchelle Gearing recommends discontinuing Coumadin anticoagulation. He has discussed this and a recommendation to avoid oral contraceptives with Ms. [name]. He recommends she ambulate every few hours while at work. He recommends beginning aspirin once the Port-A-Cath has been removed.  3. Diabetes  Disposition:  She remains in clinical remission from rectal cancer. We will followup on the CEA from today. She will return for an office visit and CEA in 6 months. We will plan a surveillance CT at a one-year interval.  Ms. Kesling will be referred to Dr. Abbey Chatters  for removal of the Port-A-Cath.   Thornton Papas, MD  12/07/2012  12:48 PM

## 2012-12-08 ENCOUNTER — Encounter: Payer: Self-pay | Admitting: Internal Medicine

## 2012-12-08 ENCOUNTER — Ambulatory Visit: Payer: 59 | Admitting: Hematology and Oncology

## 2012-12-08 ENCOUNTER — Ambulatory Visit (INDEPENDENT_AMBULATORY_CARE_PROVIDER_SITE_OTHER): Payer: 59 | Admitting: Internal Medicine

## 2012-12-08 ENCOUNTER — Telehealth: Payer: Self-pay | Admitting: Pharmacist

## 2012-12-08 VITALS — BP 112/72 | HR 69 | Ht 71.0 in | Wt 213.0 lb

## 2012-12-08 DIAGNOSIS — I2699 Other pulmonary embolism without acute cor pulmonale: Secondary | ICD-10-CM

## 2012-12-08 NOTE — Progress Notes (Signed)
Subjective:    Patient ID: [name], female    DOB: 11/14/1965, 47 y.o.   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 Had been on oral contraceptives since 2005.  Past 1 year - longer work hours (10-12 hour days). Since Jan 2012 noticed non specific bilateral lower extremity aches. Then, new diagnosis of diabetes end feb 2012 (exercise and diet regimen). Developed acute  rt lower chest and rt upper shoulder pleuritic pain on 12/21/2010 but became excruciating on 12/22/2010. Did not have associated dyspnea, hemoptysis, pedal edema In ER d-dimer high. CT chest showed bilateral lowe lobe PE but she was not hypoxemic. Had normal antithrombin III, protein C and protein S levels and negative facto 5 def  per dc summary. Denies increased sedentary levels or recent travel prior to PE. Rx with anticoagulation. Mammogram end 2010 normal per hx. Pap smear Jan 2012 normal per hx. Improved pain at discharge. However, still has some residual right sided chest discomfort that is mild, and brought on by lot of body movements. INR being tracked by PA of Dr. [name]. On Friday 01/18/2011 INR 3.3.  However, around 2 weeks ago noticed rectal bleeding while on coumadin Rx. Mixed with stools and notices it at end and when she wipes bottom. So saw Dr. Loreta Ave on 01/18/2011 - hgb unchanged per Dr. Loreta Ave verbal report. Denies straining or constipation, abdominal pain, nausea, vomit, diarrhea. Currently on lovenox shots for possible colonoscopy  OV 04/01/2011: Followup. Since last visit, diagnosed with COLON CANCER. Had resection by Dr. Abbey Chatters. No liver mets per Dr. Abbey Chatters converstation. Lymph node mets +. Now chemo and xrt by Dr. Dalene Carrow due to complete on Feb 2012. Has new rt Hickman. Had retrievable IVC filter at time of surgery but subseuqntly could not be removed due to clot burden. She is feeling fine now. Denies dyspnea, edema,  chest pain, hemoptysis. Feels well. Handling chemo/XRT well physically and emotionally. States family coping okay. Wants to know if IVC Filter can be removed and length of coumadin Rx. Coumadin monitoring by Dr. Dalene Carrow and INR most recently ws 3 per hx  REC Glad you are doing okay  You should be on coumadin minimum 1 year or till cancer is cured and catheter is out - whichever is longer  I will see you back in March 2013, till then continue coumadin with Dr. Dalene Carrow monitoring coumadin  The filter stays  When I see you back we can discuss about continued coumadin which you likely need   OV 04/01/2012  Followup PE in setting of colon cancer . Has completed 13-14 months of coumadin and 12 months since diagnosis of cancer   Doing well. Has finished chemo. Cancer currently under remission.  No problems with coumadin. Dr Dalene Carrow plans repeat ct surveillance in august and then if under remission dc coumadin and ivc filter. Shje is not too keen on continued coumadin but if recommended is okay taking it  Past, Family, Social reviewed: no change since last visit   REC You have now been on coumadin for 1 year and approximately 3 months  Please continue coumadin till august 2013 when Dr Dalene Carrow decides on removal of Hickman catheter  In August 2013, if hickman removed and no cancer recurrence I would still recommend continued anti-coagulation for another 1-2 years  - Continued anticoagulation is recommended in order to prevent recurrence  - we can consider rivoraxaban drug for  your convenience at that time  I will dicuss all this with Dr Dalene Carrow and get back to you to decide followup   OV 10/27/2012  Followup PE in setting of colon cancer, desk job 8h sitting a day and oral contraceptive.   She has now  completed PE Rx since March 2012 and diagnosis of colon CA in April-May 2013. She still is on coumadin. She has been doing well. She had ER visit 08/07/12 with chest pain. D-dimer was < 0.27 and  discharged as muscle spasm. She has been compliant with coumadin with therapeutic INR. She is now completing 21-22 months of coumadin Rx. Clinically she is cancer free. Her oncologist Dr Dalene Carrow has left the practice. Per patient plan was to repeat CT in FEb 2014 and ensure 2 year remission and remover right portocath. Her new oncologist will be Dr Truett Perna  She denies edema, chest pain, hemoptysis, dyspnea.   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 This should be done within days of coming off coumadin  - After this start daily baby aspirin 81mg  to prevent blood clot recurrence  - You should never ever take contraceptives again due to risk for blood clot  - You should take breaks every 1-2h at work while off coumadin and move your legs and keep yourself hydrated; otherwise sitting at 8h on desks is A risk factor for clot  - For all travel > 5-8h, call our office for xarelto 10mg  tablet prophylaxis or do frequent breaks, moving legs  #FOllowup  - call us after feb 2014 CT chest   OV 12/08/2012  She is here to discuss coming off Coumadin therapy. She just had CT scan of the chest abdomen and pelvis as we have surveillance from her colon cancer diagnosis. CT shows no evidence of recurrent or metastatic disease. She denies any complaints. She is due to meet with Dr. Abbey Chatters in a week and arrange for removal of the right Hickman  catheter  Review of Systems  Constitutional: Negative for fever and unexpected weight change.  HENT: Negative for ear pain, nosebleeds, congestion, sore throat, rhinorrhea, sneezing, trouble swallowing, dental problem, postnasal drip and sinus pressure.   Eyes: Negative for redness and itching.  Respiratory: Negative for cough, chest tightness, shortness of breath and wheezing.   Cardiovascular: Negative for palpitations and leg swelling.  Gastrointestinal: Negative for nausea and vomiting.  Genitourinary: Negative for dysuria.  Musculoskeletal: Negative for joint swelling.  Skin: Negative for rash.  Neurological: Negative for headaches.  Hematological: Does not bruise/bleed easily.  Psychiatric/Behavioral: Negative for dysphoric mood. The patient is not nervous/anxious.        Objective:   Physical Exam Vitals reviewed. Constitutional: She is oriented to person, place, and time. She appears well-developed and well-nourished. No distress.        Psychiatric: She has a normal mood and affect. Her behavior  is normal. Judgment and thought content normal.   Discussion only visit         Assessment & Plan:

## 2012-12-08 NOTE — Addendum Note (Signed)
Addended by: Amaryllis Dyke on: 12/08/2012 08:28 AM   Modules accepted: Orders

## 2012-12-08 NOTE — Assessment & Plan Note (Signed)
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 trYou should take breaks every 1-2h at work while off coumadin and move your legs and keep yourself hydrated  - For all Travel > 5-8h call our office for xarelto 10mg  tablet prior to travel or take injection lovenox 40mg  subcutaneous or heparin injection 5000 units (this is just a one time dose prior to each travel)  #FOllowup  - as needed  (> 50% of this 15 min visit spent in face to face counseling)

## 2012-12-08 NOTE — Patient Instructions (Addendum)
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 trYou should take breaks every 1-2h at work while off coumadin and move your legs and keep yourself hydrated  - For all Travel > 5-8h call our office for xarelto 10mg  tablet prior to travel or take injection lovenox 40mg  subcutaneous or heparin injection 5000 units (this is just a one time dose prior to each travel)  #FOllowup  - as needed

## 2012-12-16 ENCOUNTER — Encounter (INDEPENDENT_AMBULATORY_CARE_PROVIDER_SITE_OTHER): Payer: Self-pay | Admitting: General Surgery

## 2012-12-16 ENCOUNTER — Ambulatory Visit (INDEPENDENT_AMBULATORY_CARE_PROVIDER_SITE_OTHER): Payer: 59 | Admitting: General Surgery

## 2012-12-16 VITALS — BP 122/72 | HR 67 | Temp 96.1°F | Resp 18 | Ht 71.0 in | Wt 209.4 lb

## 2012-12-16 DIAGNOSIS — Z95828 Presence of other vascular implants and grafts: Secondary | ICD-10-CM

## 2012-12-16 NOTE — Progress Notes (Signed)
Patient ID: [name], female   DOB: [birth year], 47 y.o.   MRN: [id number]  Chief Complaint  Patient presents with  . Routine Post Op    discuss removal pdcath    HPI [name] is a 47 y.o. female.   HPI  She is here to discuss Port-A-Cath removal. She is disease free from the rectal cancer standpoint. Her pulmonary embolism has been treated from was 2 years and she is ready to come off the Coumadin. Her pulmonologist has told her that she can stop the Coumadin after the Port-A-Cath was removed.  Past Medical History  Diagnosis Date  . Hyperlipidemia   . Pulmonary embolism   . STD (sexually transmitted disease) 1991    POS GC-TREATED  . Diabetes mellitus     TYPE 2/diet controlled  . Abnormal FSH level 02/2012    value 53  . Cancer     rectal ca dx'd 2012  . Cancer of colon with rectum     Past Surgical History  Procedure Laterality Date  . None    . Rectal tumor by proctotomy excision    . Portacath placement  02/21/11    tip in mid svc-Talaysha Freeberg  . Cesarean section  2005    Family History  Problem Relation Age of Onset  . Heart disease Father   . Lung cancer Mother   . Brain cancer Mother   . Cancer Mother     lung and brain  . Diabetes Mother   . Hypertension Mother   . Cervical cancer Paternal Grandmother   . Cancer Paternal Grandmother     CERVICAL CANCER  . Diabetes Brother   . Kidney disease Brother     Social History History  Substance Use Topics  . Smoking status: Never Smoker   . Smokeless tobacco: Never Used  . Alcohol Use: No    Allergies  Allergen Reactions  . Penicillins Hives    Current Outpatient Prescriptions  Medication Sig Dispense Refill  . aspirin-acetaminophen-caffeine (EXCEDRIN MIGRAINE) 250-250-65 MG per tablet Take 2 tablets by mouth every 6 (six) hours as needed. For migraines      . cyanocobalamin 500 MCG tablet Take 500 mcg by mouth daily.      Marland Kitchen lidocaine-prilocaine (EMLA) cream Apply 1 application topically as  needed.      . metFORMIN (GLUCOPHAGE-XR) 500 MG 24 hr tablet Take 500 mg by mouth daily.      Marland Kitchen pyridOXINE (VITAMIN B-6) 100 MG tablet Take 100 mg by mouth 2 (two) times daily.      . simvastatin (ZOCOR) 10 MG tablet Take 10 mg by mouth at bedtime.      . Vitamin D, Ergocalciferol, (DRISDOL) 50000 UNITS CAPS Take 50,000 Units by mouth every 7 (seven) days.       Marland Kitchen warfarin (COUMADIN) 5 MG tablet TAKE 1 TABLET BY MOUTH AS DIRECTED  60 tablet  1  . [DISCONTINUED] POTASSIUM CHLORIDE PO Take 20 mEq by mouth daily.        No current facility-administered medications for this visit.    Review of Systems Review of Systems  Gastrointestinal:       Bowels are irregular.  Endocrine:       She is being evaluated for hypothyroidism.    Blood pressure 122/72, pulse 67, temperature 96.1 F (35.6 C), temperature source Temporal, resp. rate 18, height 5\' 11"  (1.803 m), weight 209 lb 6.4 oz (94.983 kg).  Physical Exam Physical Exam  Constitutional: She appears  well-developed and well-nourished. No distress.  Pulmonary/Chest:  Port-A-Cath palpable in right upper chest wall.  Abdominal: Soft.  Lower midline scar with no nodules or hernia. No hepatomegaly.    Data Reviewed Notes from medical oncology and pulmonology.  Assessment    #1 Port-A-Cath in place  #2 history of rectal cancer-currently disease free  #3 pulmonary embolism-is ready to come off treatment once Port-A-Cath is removed.     Plan    Schedule removal of Port-A-Cath. Stop Coumadin 5 days before Port-A-Cath removal.  The procedure risks and aftercare been explained. Risks include but are not limited to bleeding, infection, wound problems.       Shmuel Girgis J 12/16/2012, 11:24 AM

## 2012-12-16 NOTE — Patient Instructions (Signed)
Stop Coumadin 5 days before the operation.

## 2012-12-17 ENCOUNTER — Encounter (HOSPITAL_BASED_OUTPATIENT_CLINIC_OR_DEPARTMENT_OTHER): Payer: Self-pay | Admitting: *Deleted

## 2012-12-21 ENCOUNTER — Encounter (HOSPITAL_BASED_OUTPATIENT_CLINIC_OR_DEPARTMENT_OTHER)
Admission: RE | Admit: 2012-12-21 | Discharge: 2012-12-21 | Disposition: A | Discharge status: Home / Self Care | Payer: 59 | Source: Ambulatory Visit | Attending: General Surgery | Admitting: General Surgery

## 2012-12-21 LAB — BASIC METABOLIC PANEL
Calcium: 9.9 mg/dL (ref 8.4–10.5)
GFR calc non Af Amer: 88 mL/min — ABNORMAL LOW (ref >90)
Glucose, Bld: 129 mg/dL — ABNORMAL HIGH (ref 70–99)
Potassium: 4.1 mEq/L (ref 3.5–5.1)
Sodium: 140 mEq/L (ref 135–145)

## 2012-12-21 LAB — CBC WITH DIFFERENTIAL/PLATELET
Basophils Absolute: 0 10*3/uL (ref 0.0–0.1)
Basophils Relative: 1 % (ref 0–1)
HCT: 37 % (ref 36.0–46.0)
Lymphocytes Relative: 41 % (ref 12–46)
MCHC: 34.6 g/dL (ref 30.0–36.0)
Neutro Abs: 1.5 10*3/uL — ABNORMAL LOW (ref 1.7–7.7)
Neutrophils Relative %: 48 % (ref 43–77)
Platelets: 191 10*3/uL (ref 150–400)
RDW: 13.1 % (ref 11.5–15.5)
WBC: 3 10*3/uL — ABNORMAL LOW (ref 4.0–10.5)

## 2012-12-21 LAB — PROTIME-INR
INR: 1.05 (ref 0.00–1.49)
Prothrombin Time: 13.6 seconds (ref 11.6–15.2)

## 2012-12-22 ENCOUNTER — Ambulatory Visit (HOSPITAL_BASED_OUTPATIENT_CLINIC_OR_DEPARTMENT_OTHER): Payer: 59 | Admitting: Anesthesiology

## 2012-12-22 ENCOUNTER — Ambulatory Visit (HOSPITAL_BASED_OUTPATIENT_CLINIC_OR_DEPARTMENT_OTHER)
Admission: RE | Admit: 2012-12-22 | Discharge: 2012-12-22 | Disposition: A | Discharge status: Home / Self Care | Payer: 59 | Source: Ambulatory Visit | Attending: General Surgery | Admitting: General Surgery

## 2012-12-22 ENCOUNTER — Encounter (HOSPITAL_BASED_OUTPATIENT_CLINIC_OR_DEPARTMENT_OTHER): Payer: Self-pay | Admitting: *Deleted

## 2012-12-22 ENCOUNTER — Encounter (HOSPITAL_BASED_OUTPATIENT_CLINIC_OR_DEPARTMENT_OTHER): Payer: Self-pay | Admitting: Anesthesiology

## 2012-12-22 ENCOUNTER — Encounter (HOSPITAL_BASED_OUTPATIENT_CLINIC_OR_DEPARTMENT_OTHER)
Admission: RE | Disposition: A | Discharge status: Home / Self Care | Payer: Self-pay | Source: Ambulatory Visit | Attending: General Surgery

## 2012-12-22 DIAGNOSIS — Z452 Encounter for adjustment and management of vascular access device: Secondary | ICD-10-CM | POA: Insufficient documentation

## 2012-12-22 DIAGNOSIS — Z7901 Long term (current) use of anticoagulants: Secondary | ICD-10-CM | POA: Insufficient documentation

## 2012-12-22 DIAGNOSIS — Z85048 Personal history of other malignant neoplasm of rectum, rectosigmoid junction, and anus: Secondary | ICD-10-CM | POA: Insufficient documentation

## 2012-12-22 DIAGNOSIS — Z9221 Personal history of antineoplastic chemotherapy: Secondary | ICD-10-CM | POA: Insufficient documentation

## 2012-12-22 DIAGNOSIS — Z79899 Other long term (current) drug therapy: Secondary | ICD-10-CM | POA: Insufficient documentation

## 2012-12-22 DIAGNOSIS — Z880 Allergy status to penicillin: Secondary | ICD-10-CM | POA: Insufficient documentation

## 2012-12-22 DIAGNOSIS — E119 Type 2 diabetes mellitus without complications: Secondary | ICD-10-CM | POA: Insufficient documentation

## 2012-12-22 DIAGNOSIS — E785 Hyperlipidemia, unspecified: Secondary | ICD-10-CM | POA: Insufficient documentation

## 2012-12-22 DIAGNOSIS — Z86711 Personal history of pulmonary embolism: Secondary | ICD-10-CM | POA: Insufficient documentation

## 2012-12-22 HISTORY — PX: PORT-A-CATH REMOVAL: SHX5289

## 2012-12-22 HISTORY — DX: Headache: R51

## 2012-12-22 LAB — GLUCOSE, CAPILLARY: Glucose-Capillary: 96 mg/dL (ref 70–99)

## 2012-12-22 LAB — POCT HEMOGLOBIN-HEMACUE: Hemoglobin: 14.5 g/dL (ref 12.0–15.0)

## 2012-12-22 SURGERY — REMOVAL PORT-A-CATH
Anesthesia: General | Site: Chest | Laterality: Right | Wound class: Clean

## 2012-12-22 MED ORDER — OXYCODONE HCL 5 MG PO TABS: 5.0000 mg | ORAL_TABLET | ORAL | Status: DC | PRN

## 2012-12-22 MED ORDER — SODIUM CHLORIDE 0.9 % IJ SOLN: 3.0000 mL | INTRAMUSCULAR | Status: DC | PRN

## 2012-12-22 MED ORDER — MIDAZOLAM HCL 2 MG/2ML IJ SOLN: 1.0000 mg | INTRAMUSCULAR | Status: DC | PRN

## 2012-12-22 MED ORDER — LACTATED RINGERS IV SOLN
INTRAVENOUS | Status: DC
  Administered 2012-12-22: 13:00:00 via INTRAVENOUS

## 2012-12-22 MED ORDER — ONDANSETRON HCL 4 MG/2ML IJ SOLN: 4.0000 mg | Freq: Four times a day (QID) | INTRAMUSCULAR | Status: DC | PRN

## 2012-12-22 MED ORDER — ACETAMINOPHEN 325 MG PO TABS: 650.0000 mg | ORAL_TABLET | ORAL | Status: DC | PRN

## 2012-12-22 MED ORDER — DEXAMETHASONE SODIUM PHOSPHATE 4 MG/ML IJ SOLN
INTRAMUSCULAR | Status: DC | PRN
  Administered 2012-12-22: 10 mg via INTRAVENOUS

## 2012-12-22 MED ORDER — FENTANYL CITRATE 0.05 MG/ML IJ SOLN
INTRAMUSCULAR | Status: DC | PRN
  Administered 2012-12-22: 100 ug via INTRAVENOUS

## 2012-12-22 MED ORDER — HYDROMORPHONE HCL PF 1 MG/ML IJ SOLN: 0.2500 mg | INTRAMUSCULAR | Status: DC | PRN

## 2012-12-22 MED ORDER — BUPIVACAINE HCL (PF) 0.5 % IJ SOLN
INTRAMUSCULAR | Status: DC | PRN
  Administered 2012-12-22: 8.5 mL

## 2012-12-22 MED ORDER — LIDOCAINE HCL (CARDIAC) 20 MG/ML IV SOLN
INTRAVENOUS | Status: DC | PRN
  Administered 2012-12-22: 100 mg via INTRAVENOUS

## 2012-12-22 MED ORDER — MIDAZOLAM HCL 5 MG/5ML IJ SOLN
INTRAMUSCULAR | Status: DC | PRN
  Administered 2012-12-22: 2 mg via INTRAVENOUS

## 2012-12-22 MED ORDER — FENTANYL CITRATE 0.05 MG/ML IJ SOLN: 50.0000 ug | INTRAMUSCULAR | Status: DC | PRN

## 2012-12-22 MED ORDER — ACETAMINOPHEN 650 MG RE SUPP: 650.0000 mg | RECTAL | Status: DC | PRN

## 2012-12-22 MED ORDER — ONDANSETRON HCL 4 MG/2ML IJ SOLN
INTRAMUSCULAR | Status: DC | PRN
  Administered 2012-12-22: 4 mg via INTRAVENOUS

## 2012-12-22 MED ORDER — PROPOFOL 10 MG/ML IV BOLUS
INTRAVENOUS | Status: DC | PRN
  Administered 2012-12-22: 200 mg via INTRAVENOUS

## 2012-12-22 MED ORDER — HYDROCODONE-ACETAMINOPHEN 5-325 MG PO TABS: 1.0000 | ORAL_TABLET | Freq: Four times a day (QID) | ORAL | Status: DC | PRN

## 2012-12-22 SURGICAL SUPPLY — 37 items
BENZOIN TINCTURE PRP APPL 2/3 (GAUZE/BANDAGES/DRESSINGS) ×2 IMPLANT
BLADE SURG 15 STRL LF DISP TIS (BLADE) ×1 IMPLANT
BLADE SURG 15 STRL SS (BLADE) ×2
CHLORAPREP W/TINT 26ML (MISCELLANEOUS) ×2 IMPLANT
CLEANER CAUTERY TIP 5X5 PAD (MISCELLANEOUS) IMPLANT
CLOTH BEACON ORANGE TIMEOUT ST (SAFETY) IMPLANT
COVER MAYO STAND STRL (DRAPES) ×2 IMPLANT
COVER TABLE BACK 60X90 (DRAPES) ×2 IMPLANT
DECANTER SPIKE VIAL GLASS SM (MISCELLANEOUS) ×2 IMPLANT
DRAPE PED LAPAROTOMY (DRAPES) ×2 IMPLANT
DRAPE UTILITY XL STRL (DRAPES) IMPLANT
DRESSING TELFA 8X3 (GAUZE/BANDAGES/DRESSINGS) IMPLANT
DRSG TEGADERM 2-3/8X2-3/4 SM (GAUZE/BANDAGES/DRESSINGS) IMPLANT
DRSG TEGADERM 4X4.75 (GAUZE/BANDAGES/DRESSINGS) ×2 IMPLANT
ELECT REM PT RETURN 9FT ADLT (ELECTROSURGICAL) ×2
ELECTRODE REM PT RTRN 9FT ADLT (ELECTROSURGICAL) ×1 IMPLANT
GAUZE SPONGE 4X4 12PLY STRL LF (GAUZE/BANDAGES/DRESSINGS) ×2 IMPLANT
GLOVE BIO SURGEON STRL SZ7 (GLOVE) ×2 IMPLANT
GLOVE BIOGEL PI IND STRL 8.5 (GLOVE) ×1 IMPLANT
GLOVE BIOGEL PI INDICATOR 8.5 (GLOVE) ×1
GLOVE ECLIPSE 8.0 STRL XLNG CF (GLOVE) ×2 IMPLANT
GOWN PREVENTION PLUS XLARGE (GOWN DISPOSABLE) ×2 IMPLANT
GOWN PREVENTION PLUS XXLARGE (GOWN DISPOSABLE) ×2 IMPLANT
NEEDLE HYPO 25X1 1.5 SAFETY (NEEDLE) ×2 IMPLANT
PACK BASIN DAY SURGERY FS (CUSTOM PROCEDURE TRAY) ×2 IMPLANT
PAD CLEANER CAUTERY TIP 5X5 (MISCELLANEOUS)
PENCIL BUTTON HOLSTER BLD 10FT (ELECTRODE) ×2 IMPLANT
SLEEVE SCD COMPRESS KNEE MED (MISCELLANEOUS) IMPLANT
SPONGE GAUZE 2X2 8PLY STRL LF (GAUZE/BANDAGES/DRESSINGS) ×2 IMPLANT
STRIP CLOSURE SKIN 1/2X4 (GAUZE/BANDAGES/DRESSINGS) ×2 IMPLANT
SUT MON AB 4-0 PC3 18 (SUTURE) ×2 IMPLANT
SUT VIC AB 4-0 SH 27 (SUTURE) ×1
SUT VIC AB 4-0 SH 27XANBCTRL (SUTURE) ×1 IMPLANT
SYR CONTROL 10ML LL (SYRINGE) ×2 IMPLANT
TOWEL OR 17X24 6PK STRL BLUE (TOWEL DISPOSABLE) ×2 IMPLANT
TOWEL OR NON WOVEN STRL DISP B (DISPOSABLE) ×2 IMPLANT
WATER STERILE IRR 1000ML POUR (IV SOLUTION) ×2 IMPLANT

## 2012-12-22 NOTE — Op Note (Signed)
PREOPERATIVE DIAGNOSIS:  Retain Port-a-cath.  POSTOPERATIVE DIAGNOSIS:  Same  PROCEDURE:    Porta-cath removal  SURGEON:  Avel Peace, M.D.  ANESTHESIA:  Local with MAC  INDICATION:  This is a 47 year old female with rectal cancer who has received chemotherapy via the Porta-cath.  The Porta-cath is no longer needed.  She now presents for removal.  The procedure, risks, and aftercare have been explained preoperatively.   The right upper chest wall and neck were sterilely prepped and draped. Local anesthetic was infiltrated at the site of the port in the chest wall superficially and deep. The previous scar was incised sharply and then using electrocautery the subcutaneous tissue was divided until the port and catheter were identified.  The fibrous sheath was dissected free from the catheter and it was pulled out of the internal jugular vein. Direct pressure was held over the vein site for 10-15 minutes.  The port was then dissected free from the chest wall using electrocautery. The port and catheter were removed intact. The chest wall area was inspected and bleeding controlled with electrocautery. Once hemostasis was adequate, the chest wall incision was closed in 2 layers. The subcutaneous tissues approximated with running 3-0 Vicryl suture. The skin was closed with a running 4-0 Monocryl subcuticular stitch. Steri-Strips and a sterile dressing were applied.  She tolerated the procedure well without any apparent complications and was taken to the recovery room in satisfactory condition.

## 2012-12-22 NOTE — Interval H&P Note (Signed)
History and Physical Interval Note:  12/22/2012 1:33 PM  [name]  has presented today for surgery, with the diagnosis of port-a-cath in place  The various methods of treatment have been discussed with the patient and family. After consideration of risks, benefits and other options for treatment, the patient has consented to  Procedure(s): REMOVAL PORT-A-CATH (N/A) as a surgical intervention .  The patient's history has been reviewed, patient examined, no change in status, stable for surgery.  I have reviewed the patient's chart and labs.  Questions were answered to the patient's satisfaction.     ROSENBOWER,TODD Shela Commons

## 2012-12-22 NOTE — Anesthesia Procedure Notes (Signed)
Procedure Name: LMA Insertion Date/Time: 12/22/2012 1:42 PM Performed by: Caren Macadam Pre-anesthesia Checklist: Patient identified, Emergency Drugs available, Suction available and Patient being monitored Patient Re-evaluated:Patient Re-evaluated prior to inductionOxygen Delivery Method: Circle System Utilized Preoxygenation: Pre-oxygenation with 100% oxygen Intubation Type: IV induction Ventilation: Mask ventilation without difficulty LMA: LMA inserted LMA Size: 4.0 Number of attempts: 1 Airway Equipment and Method: bite block Placement Confirmation: positive ETCO2 Tube secured with: Tape Dental Injury: Teeth and Oropharynx as per pre-operative assessment

## 2012-12-22 NOTE — Transfer of Care (Signed)
Immediate Anesthesia Transfer of Care Note  Patient: [name]  Procedure(s) Performed: Procedure(s): REMOVAL PORT-A-CATH (Right)  Patient Location: PACU  Anesthesia Type:General  Level of Consciousness: awake, alert  and oriented  Airway & Oxygen Therapy: Patient Spontanous Breathing and Patient connected to face mask oxygen  Post-op Assessment: Report given to PACU RN, Post -op Vital signs reviewed and stable and Patient moving all extremities  Post vital signs: Reviewed and stable  Complications: No apparent anesthesia complications

## 2012-12-22 NOTE — Anesthesia Preprocedure Evaluation (Addendum)
Anesthesia Evaluation  Patient identified by MRN, date of birth, ID band Patient awake    Reviewed: Allergy & Precautions, H&P , NPO status , Patient's Chart, lab work & pertinent test results  Airway Mallampati: II TM Distance: >3 FB Neck ROM: Full    Dental no notable dental hx. (+) Teeth Intact and Dental Advisory Given   Pulmonary neg pulmonary ROS,  breath sounds clear to auscultation  Pulmonary exam normal       Cardiovascular negative cardio ROS  Rhythm:Regular Rate:Normal     Neuro/Psych negative neurological ROS  negative psych ROS   GI/Hepatic negative GI ROS, Neg liver ROS,   Endo/Other  diabetes, Well Controlled, Type 2, Oral Hypoglycemic Agents  Renal/GU negative Renal ROS  negative genitourinary   Musculoskeletal   Abdominal   Peds  Hematology negative hematology ROS (+)   Anesthesia Other Findings   Reproductive/Obstetrics negative OB ROS                           Anesthesia Physical Anesthesia Plan  ASA: II  Anesthesia Plan: General   Post-op Pain Management:    Induction: Intravenous  Airway Management Planned: LMA  Additional Equipment:   Intra-op Plan:   Post-operative Plan: Extubation in OR  Informed Consent: I have reviewed the patients History and Physical, chart, labs and discussed the procedure including the risks, benefits and alternatives for the proposed anesthesia with the patient or authorized representative who has indicated his/her understanding and acceptance.   Dental advisory given  Plan Discussed with: CRNA  Anesthesia Plan Comments:         Anesthesia Quick Evaluation

## 2012-12-22 NOTE — Anesthesia Postprocedure Evaluation (Signed)
  Anesthesia Post-op Note  Patient: [name]  Procedure(s) Performed: Procedure(s): REMOVAL PORT-A-CATH (Right)  Patient Location: PACU  Anesthesia Type:General  Level of Consciousness: awake and alert   Airway and Oxygen Therapy: Patient Spontanous Breathing  Post-op Pain: none  Post-op Assessment: Post-op Vital signs reviewed, Patient's Cardiovascular Status Stable, Respiratory Function Stable, Patent Airway and No signs of Nausea or vomiting  Post-op Vital Signs: Reviewed and stable  Complications: No apparent anesthesia complications

## 2012-12-22 NOTE — H&P (View-Only) (Signed)
Patient ID: [name], female   DOB: 10/25/1965, 47 y.o.   MRN: [id number]  Chief Complaint  Patient presents with  . Routine Post Op    discuss removal pdcath    HPI [name] is a 47 y.o. female.   HPI  She is here to discuss Port-A-Cath removal. She is disease free from the rectal cancer standpoint. Her pulmonary embolism has been treated from was 2 years and she is ready to come off the Coumadin. Her pulmonologist has told her that she can stop the Coumadin after the Port-A-Cath was removed.  Past Medical History  Diagnosis Date  . Hyperlipidemia   . Pulmonary embolism   . STD (sexually transmitted disease) 1991    POS GC-TREATED  . Diabetes mellitus     TYPE 2/diet controlled  . Abnormal FSH level 02/2012    value 53  . Cancer     rectal ca dx'd 2012  . Cancer of colon with rectum     Past Surgical History  Procedure Laterality Date  . None    . Rectal tumor by proctotomy excision    . Portacath placement  02/21/11    tip in mid svc-Oberia Beaudoin  . Cesarean section  2005    Family History  Problem Relation Age of Onset  . Heart disease Father   . Lung cancer Mother   . Brain cancer Mother   . Cancer Mother     lung and brain  . Diabetes Mother   . Hypertension Mother   . Cervical cancer Paternal Grandmother   . Cancer Paternal Grandmother     CERVICAL CANCER  . Diabetes Brother   . Kidney disease Brother     Social History History  Substance Use Topics  . Smoking status: Never Smoker   . Smokeless tobacco: Never Used  . Alcohol Use: No    Allergies  Allergen Reactions  . Penicillins Hives    Current Outpatient Prescriptions  Medication Sig Dispense Refill  . aspirin-acetaminophen-caffeine (EXCEDRIN MIGRAINE) 250-250-65 MG per tablet Take 2 tablets by mouth every 6 (six) hours as needed. For migraines      . cyanocobalamin 500 MCG tablet Take 500 mcg by mouth daily.      . lidocaine-prilocaine (EMLA) cream Apply 1 application topically as  needed.      . metFORMIN (GLUCOPHAGE-XR) 500 MG 24 hr tablet Take 500 mg by mouth daily.      . pyridOXINE (VITAMIN B-6) 100 MG tablet Take 100 mg by mouth 2 (two) times daily.      . simvastatin (ZOCOR) 10 MG tablet Take 10 mg by mouth at bedtime.      . Vitamin D, Ergocalciferol, (DRISDOL) 50000 UNITS CAPS Take 50,000 Units by mouth every 7 (seven) days.       . warfarin (COUMADIN) 5 MG tablet TAKE 1 TABLET BY MOUTH AS DIRECTED  60 tablet  1  . [DISCONTINUED] POTASSIUM CHLORIDE PO Take 20 mEq by mouth daily.        No current facility-administered medications for this visit.    Review of Systems Review of Systems  Gastrointestinal:       Bowels are irregular.  Endocrine:       She is being evaluated for hypothyroidism.    Blood pressure 122/72, pulse 67, temperature 96.1 F (35.6 C), temperature source Temporal, resp. rate 18, height 5' 11" (1.803 m), weight 209 lb 6.4 oz (94.983 kg).  Physical Exam Physical Exam  Constitutional: She appears   well-developed and well-nourished. No distress.  Pulmonary/Chest:  Port-A-Cath palpable in right upper chest wall.  Abdominal: Soft.  Lower midline scar with no nodules or hernia. No hepatomegaly.    Data Reviewed Notes from medical oncology and pulmonology.  Assessment    #1 Port-A-Cath in place  #2 history of rectal cancer-currently disease free  #3 pulmonary embolism-is ready to come off treatment once Port-A-Cath is removed.     Plan    Schedule removal of Port-A-Cath. Stop Coumadin 5 days before Port-A-Cath removal.  The procedure risks and aftercare been explained. Risks include but are not limited to bleeding, infection, wound problems.       [name] J 12/16/2012, 11:24 AM    

## 2012-12-23 ENCOUNTER — Encounter (HOSPITAL_BASED_OUTPATIENT_CLINIC_OR_DEPARTMENT_OTHER): Payer: Self-pay | Admitting: General Surgery

## 2012-12-23 ENCOUNTER — Telehealth (INDEPENDENT_AMBULATORY_CARE_PROVIDER_SITE_OTHER): Payer: Self-pay | Admitting: General Surgery

## 2012-12-23 NOTE — Telephone Encounter (Signed)
Spoke with patient no po f/u needed unless  Issues with port

## 2012-12-29 ENCOUNTER — Telehealth: Payer: Self-pay | Admitting: Pharmacist

## 2012-12-29 NOTE — Telephone Encounter (Signed)
Spoke with patient. Coumadin has been discontinued. She is no longer taking coumadin.  I will cancel lab/coumadin clinic appointment for 01/04/13. Patient has been archived in [facility]. Lab aware.

## 2013-01-04 ENCOUNTER — Other Ambulatory Visit: Payer: 59 | Admitting: Lab

## 2013-01-04 ENCOUNTER — Ambulatory Visit: Payer: 59

## 2013-04-27 IMAGING — PT NM PET TUM IMG INITIAL (PI) SKULL BASE T - THIGH
6 series · 25 of 25 positions shown · non-contrast
Comparison: Multiple exams, including 01/28/2011

CLINICAL DATA: Metastatic rectal adenocarcinoma, status post low
anterior resection on January 31, 2011.  At pathology the
adenocarcinoma extended into the pericolonic connective tissue
(margins not involved) and [DATE] regional lymph nodes were involved.

NUCLEAR MEDICINE PET/CT
TECHNIQUE: 19.2 mCi F-18 FDG was injected intravenously.  Full-
ring PET imaging was performed from the skull base through the mid-
thighs.  CT data was obtained and used for attenuation correction
and anatomic localization only.  (This was not acquired as a
diagnostic CT examination.)  Data regarding site of injection,
patient weight, post injection waiting period, and fasting blood
sugar is available in the PACS documentation.

[Series 1: pet ac · axial · 3.3mm · 4.69mm/px · z∈[-870,+0]mm · 5 of 267 slices shown]
[im 1/267]
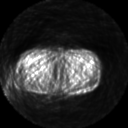
[im 67/267]
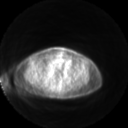
[im 134/267]
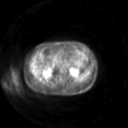
[im 200/267]
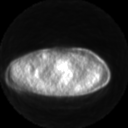
[im 267/267]
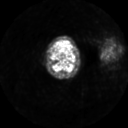

[Series 2: ct images · axial · 3.8mm · 0.98mm/px · z∈[-870,+0]mm · 6 of 267 slices shown]
[im 1/267]
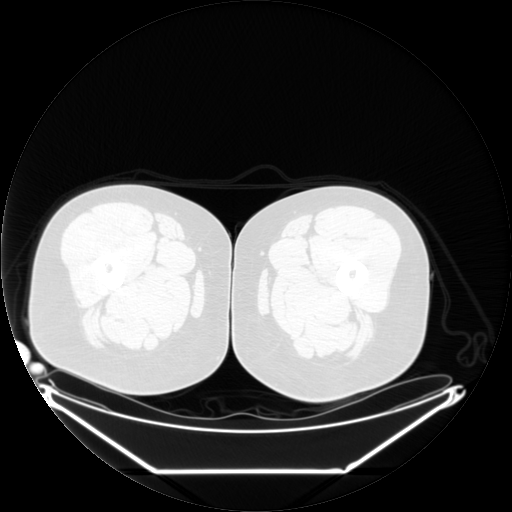
[im 54/267]
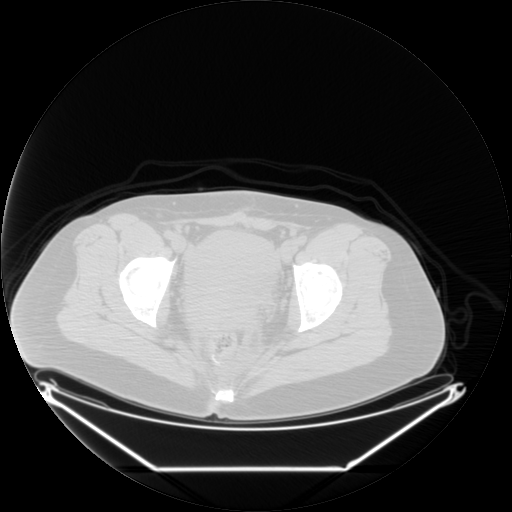
[im 107/267]
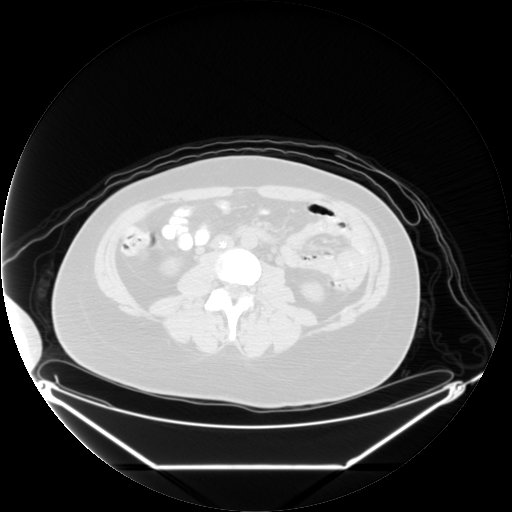
[im 160/267]
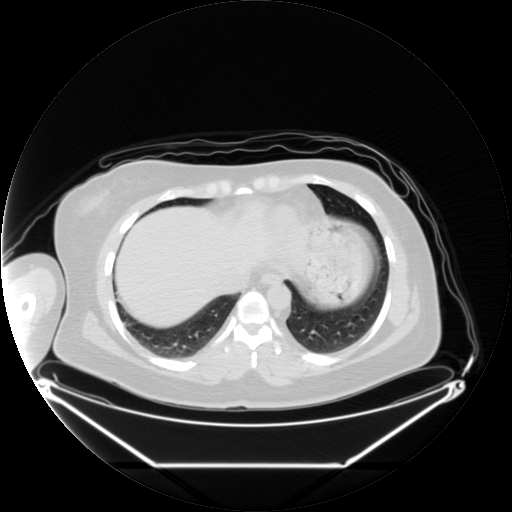
[im 213/267]
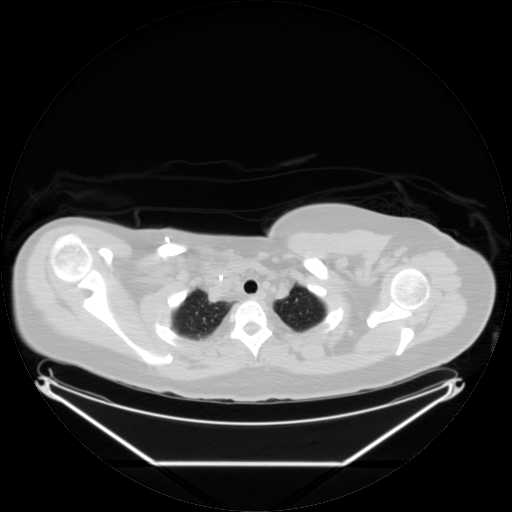
[im 267/267  brain]
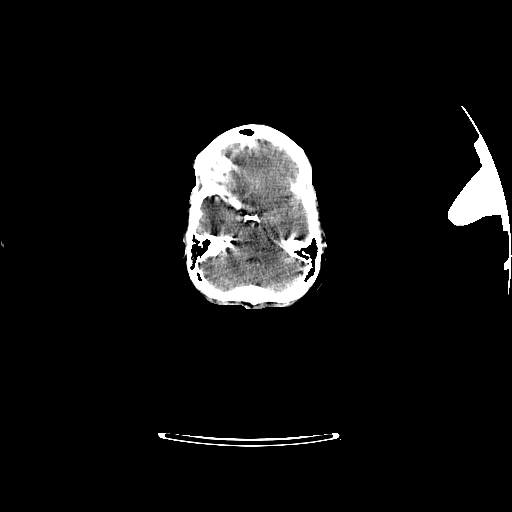

[Series 2: pet nac · axial · 3.3mm · 4.69mm/px · z∈[-870,+0]mm · 6 of 267 slices shown]
[im 1/267]
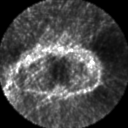
[im 54/267]
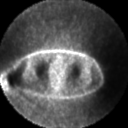
[im 107/267]
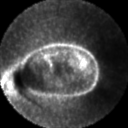
[im 160/267]
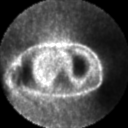
[im 213/267]
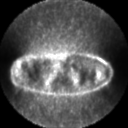
[im 267/267]
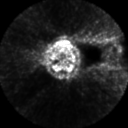

[Series 123: mip · coronal · 3.3mm · 4.69mm/px · 1 of 30 slices shown]
[im 1/30]
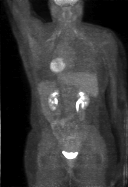

[Series 152: reformatted · coronal · 4.7mm · 6.98mm/px · 1 of 64 slices shown (1 of 2)]
[im 1/64]
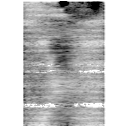

[Series 154: reformatted · axial · 3.3mm · 3.91mm/px · z∈[-870,+0]mm · 6 of 265 slices shown (2 of 2)]
[im 1/265]
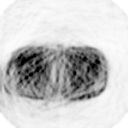
[im 53/265]
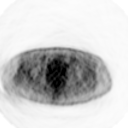
[im 106/265]
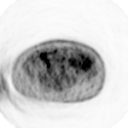
[im 159/265]
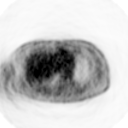
[im 212/265]
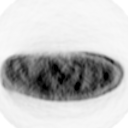
[im 265/265]
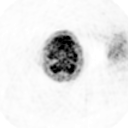

[25 of 25 positions shown; findings below may reference images not displayed]

FINDINGS: Right Port-A-Cath noted.

No significant abnormal hypermetabolic activity in the neck or
chest identified.

Liver activity is homogeneous, without focal hypermetabolic
activity in the areas of concern on the recent CT scan to suggest
malignancy.  The spleen, pancreas, and adrenal glands appear
unremarkable.

Minimal bilateral nonobstructive nephrolithiasis noted.

Postoperative findings in the rectosigmoid region noted with
expected low-level postoperative activity.  No residual regional
hypermetabolic activity in the pelvis to favor residual or
recurrent malignancy at this time.

IVC filter noted.

Bilateral sacroiliac joint arthropathy noted.
IMPRESSION: 1.  Postoperative findings in the rectosigmoid region noted.
2.  We do not demonstrate hypermetabolic activity in the liver.
However, given the vague hypodensities in the liver on abdomen CT,
I feel that further workup is probably warranted prior to this
missing the possibility of the liver metastatic disease. Dedicated
liver protocol MRI of the abdomen with without contrast could be
useful to further establish the presence or absence of the liver
lesions.  Alternatively, sonography could be utilized to assess for
any sonographically visible hepatic lesion which may be amenable to
biopsy.
3.  Mild bilateral nonobstructive nephrolithiasis.

## 2013-06-07 ENCOUNTER — Telehealth: Payer: Self-pay | Admitting: Oncology

## 2013-06-07 ENCOUNTER — Other Ambulatory Visit: Payer: 59 | Admitting: Lab

## 2013-06-07 ENCOUNTER — Ambulatory Visit (HOSPITAL_BASED_OUTPATIENT_CLINIC_OR_DEPARTMENT_OTHER): Payer: 59 | Admitting: Oncology

## 2013-06-07 VITALS — BP 116/80 | HR 64 | Temp 97.1°F | Resp 20 | Ht 71.0 in | Wt 209.3 lb

## 2013-06-07 DIAGNOSIS — C20 Malignant neoplasm of rectum: Secondary | ICD-10-CM

## 2013-06-07 DIAGNOSIS — I2699 Other pulmonary embolism without acute cor pulmonale: Secondary | ICD-10-CM

## 2013-06-07 NOTE — Progress Notes (Signed)
   [facility]    OFFICE PROGRESS NOTE   INTERVAL HISTORY:   She as scheduled. She malaise when she discontinued thyroid hormone replacement. This has improved. The thyroid hormone dosing is being managed by Dr. Lurene Shadow. [name] otherwise feels well. No symptoms of recurrent venous thrombosis. She is no longer taking Coumadin. The Port-A-Cath was removed in March.  Objective:  Vital signs in last 24 hours:  Blood pressure 116/80, pulse 64, temperature 97.1 F (36.2 C), temperature source Oral, resp. rate 20, height 5\' 11"  (1.803 m), weight 209 lb 4.8 oz (94.938 kg).    HEENT: Neck without mass Lymphatics: No cervical, supra-clavicular, axillary, or inguinal nodes Resp: Lungs clear bilaterally Cardio: Regular rate and rhythm GI: No hepatosplenomegaly, nontender, no mass Vascular: No leg edema   Lab Results:  CEA pending   Medications: I have reviewed the patient's current medications.  Assessment/Plan: 1.Rectal cancer, stage III (T3 N1b), status post a low anterior resection 01/31/2011, status post radiation with continuous infusion 5-FU 03/19/2011 through 04/30/2011. Status post FOLFOX beginning 07/03/2011 for 8 cycles. Surveillance colonoscopy 02/19/2012-negative for recurrent cancer or polyps  2. Pulmonary embolism March 2012, status post IVC filter placement prior to cancer surgery in April 2012, followed by Dr. Marchelle Gearing. Risk factors for pulmonary embolism felt to be related to obesity, oral contraceptive use, rectal cancer, and sedentary job. Dr. Marchelle Gearing recommends discontinuing Coumadin anticoagulation. He has discussed this and a recommendation to avoid oral contraceptives with [name]. He recommends she ambulate every few hours while at work. He recommended beginning aspirin after the Port-A-Cath was removed .  3.removal of Port-A-Cath March 2014    Disposition:  She remains in clinical remission from rectal cancer. We will followup on the  CEA from today. She will return for an office visit and restaging CT evaluation in 6 months.   Thornton Papas, MD  06/07/2013  4:39 PM

## 2013-06-07 NOTE — Telephone Encounter (Signed)
Gave pt appt for lab and Md, for February and and MArch 2015 , gave pt oral contrast for CT

## 2013-06-09 ENCOUNTER — Telehealth: Payer: Self-pay | Admitting: *Deleted

## 2013-06-09 NOTE — Telephone Encounter (Signed)
Message copied by Caleb Popp on Wed Jun 09, 2013 11:16 AM ------      Message from: Ladene Artist      Created: Tue Jun 08, 2013  5:23 PM       Please call patient, cea is normal ------

## 2013-06-09 NOTE — Telephone Encounter (Signed)
Called pt with CEA results. She voiced understanding and appreciation for call.

## 2013-07-09 ENCOUNTER — Other Ambulatory Visit: Payer: Self-pay | Admitting: Oncology

## 2013-07-09 DIAGNOSIS — I2699 Other pulmonary embolism without acute cor pulmonale: Secondary | ICD-10-CM

## 2013-07-13 ENCOUNTER — Telehealth: Payer: Self-pay | Admitting: Oncology

## 2013-07-13 NOTE — Telephone Encounter (Signed)
gve the referral information to [facility] In medical records for her to schedule the pt with a hematologist at [facility].

## 2013-07-14 ENCOUNTER — Telehealth: Payer: Self-pay | Admitting: *Deleted

## 2013-07-14 ENCOUNTER — Other Ambulatory Visit: Payer: Self-pay | Admitting: *Deleted

## 2013-07-14 ENCOUNTER — Telehealth: Payer: Self-pay | Admitting: Oncology

## 2013-07-14 NOTE — Telephone Encounter (Signed)
Called pt to verify she is taking Aspirin. She reports she is taking 81 mg daily. Informed pt [facility] referral has been sent, she understands to call the office if she doesn't hear from them.

## 2013-07-14 NOTE — Telephone Encounter (Signed)
Faxed pt medical records to [facility] Thrombophilia service. Nurse will review records and call with appt.

## 2013-07-20 ENCOUNTER — Telehealth: Payer: Self-pay | Admitting: Oncology

## 2013-07-20 NOTE — Telephone Encounter (Signed)
Pt appt. With Dr. Harlow Ohms @ [facility] is 08/16/13@8 :30. Pt is aware, medical records faxed

## 2013-11-01 DIAGNOSIS — I2699 Other pulmonary embolism without acute cor pulmonale: Secondary | ICD-10-CM | POA: Insufficient documentation

## 2013-11-01 DIAGNOSIS — E669 Obesity, unspecified: Secondary | ICD-10-CM | POA: Insufficient documentation

## 2013-11-01 DIAGNOSIS — E119 Type 2 diabetes mellitus without complications: Secondary | ICD-10-CM | POA: Insufficient documentation

## 2013-11-01 DIAGNOSIS — E113299 Type 2 diabetes mellitus with mild nonproliferative diabetic retinopathy without macular edema, unspecified eye: Secondary | ICD-10-CM | POA: Insufficient documentation

## 2013-12-17 ENCOUNTER — Other Ambulatory Visit (HOSPITAL_BASED_OUTPATIENT_CLINIC_OR_DEPARTMENT_OTHER): Payer: 59

## 2013-12-17 ENCOUNTER — Ambulatory Visit (HOSPITAL_COMMUNITY)
Admission: RE | Admit: 2013-12-17 | Discharge: 2013-12-17 | Disposition: A | Discharge status: Home / Self Care | Payer: 59 | Source: Ambulatory Visit | Attending: Oncology | Admitting: Oncology

## 2013-12-17 DIAGNOSIS — C20 Malignant neoplasm of rectum: Secondary | ICD-10-CM

## 2013-12-17 DIAGNOSIS — R079 Chest pain, unspecified: Secondary | ICD-10-CM | POA: Insufficient documentation

## 2013-12-17 DIAGNOSIS — C19 Malignant neoplasm of rectosigmoid junction: Secondary | ICD-10-CM | POA: Insufficient documentation

## 2013-12-17 DIAGNOSIS — N200 Calculus of kidney: Secondary | ICD-10-CM | POA: Insufficient documentation

## 2013-12-17 LAB — CBC WITH DIFFERENTIAL/PLATELET
BASO%: 0.6 % (ref 0.0–2.0)
BASOS ABS: 0 10*3/uL (ref 0.0–0.1)
EOS ABS: 0 10*3/uL (ref 0.0–0.5)
EOS%: 1.3 % (ref 0.0–7.0)
HEMATOCRIT: 40.6 % (ref 34.8–46.6)
HEMOGLOBIN: 13.3 g/dL (ref 11.6–15.9)
LYMPH#: 1.4 10*3/uL (ref 0.9–3.3)
LYMPH%: 41.8 % (ref 14.0–49.7)
MCH: 29.6 pg (ref 25.1–34.0)
MCHC: 32.8 g/dL (ref 31.5–36.0)
MCV: 90.3 fL (ref 79.5–101.0)
MONO#: 0.3 10*3/uL (ref 0.1–0.9)
MONO%: 7.6 % (ref 0.0–14.0)
NEUT%: 48.7 % (ref 38.4–76.8)
NEUTROS ABS: 1.6 10*3/uL (ref 1.5–6.5)
Platelets: 219 10*3/uL (ref 145–400)
RBC: 4.5 10*6/uL (ref 3.70–5.45)
RDW: 13.5 % (ref 11.2–14.5)
WBC: 3.3 10*3/uL — ABNORMAL LOW (ref 3.9–10.3)

## 2013-12-17 LAB — COMPREHENSIVE METABOLIC PANEL (CC13)
ALBUMIN: 4 g/dL (ref 3.5–5.0)
ALT: 19 U/L (ref 0–55)
ANION GAP: 9 meq/L (ref 3–11)
AST: 15 U/L (ref 5–34)
Alkaline Phosphatase: 147 U/L (ref 40–150)
BUN: 14.1 mg/dL (ref 7.0–26.0)
CALCIUM: 9.7 mg/dL (ref 8.4–10.4)
CHLORIDE: 104 meq/L (ref 98–109)
CO2: 28 meq/L (ref 22–29)
CREATININE: 0.8 mg/dL (ref 0.6–1.1)
GLUCOSE: 123 mg/dL (ref 70–140)
POTASSIUM: 4.1 meq/L (ref 3.5–5.1)
Sodium: 142 mEq/L (ref 136–145)
Total Bilirubin: 0.3 mg/dL (ref 0.20–1.20)
Total Protein: 7.7 g/dL (ref 6.4–8.3)

## 2013-12-17 MED ORDER — IOHEXOL 300 MG/ML  SOLN
100.0000 mL | Freq: Once | INTRAMUSCULAR | Status: AC | PRN
  Administered 2013-12-17: 100 mL via INTRAVENOUS

## 2013-12-18 LAB — CEA

## 2013-12-21 ENCOUNTER — Telehealth: Payer: Self-pay | Admitting: *Deleted

## 2013-12-21 NOTE — Telephone Encounter (Signed)
Message copied by Brien Few on Tue Dec 21, 2013 12:24 PM ------      Message from: Ladell Pier      Created: Sat Dec 18, 2013  6:32 PM       Please call patient, cea is normal and CTs are negative, see if she can reschedule appt. To 3/26 4PM or 3/27 3:30             ------

## 2013-12-21 NOTE — Telephone Encounter (Signed)
Called pt, informed her of CT and CEA results. She voiced understanding and appreciation for call. Pt agrees to reschedule office visit to 3/26 or 3/27. Orders entered.

## 2013-12-22 ENCOUNTER — Telehealth: Payer: Self-pay | Admitting: Oncology

## 2013-12-22 NOTE — Telephone Encounter (Signed)
lvm for pt regarding to March 6 appt moved to March 27 per MD pof....mailed pt appt sched/avs and letter

## 2013-12-24 ENCOUNTER — Ambulatory Visit: Payer: 59 | Admitting: Oncology

## 2014-01-14 ENCOUNTER — Telehealth: Payer: Self-pay | Admitting: Oncology

## 2014-01-14 ENCOUNTER — Ambulatory Visit (HOSPITAL_BASED_OUTPATIENT_CLINIC_OR_DEPARTMENT_OTHER): Payer: 59 | Admitting: Oncology

## 2014-01-14 VITALS — BP 137/82 | HR 66 | Temp 97.4°F | Resp 18 | Ht 71.0 in | Wt 207.5 lb

## 2014-01-14 DIAGNOSIS — R32 Unspecified urinary incontinence: Secondary | ICD-10-CM

## 2014-01-14 DIAGNOSIS — Z86711 Personal history of pulmonary embolism: Secondary | ICD-10-CM

## 2014-01-14 DIAGNOSIS — R159 Full incontinence of feces: Secondary | ICD-10-CM

## 2014-01-14 DIAGNOSIS — C20 Malignant neoplasm of rectum: Secondary | ICD-10-CM

## 2014-01-14 NOTE — Progress Notes (Signed)
  [facility] OFFICE PROGRESS NOTE   Diagnosis: Rectal cancer  INTERVAL HISTORY:   She returns for scheduled followup. She reports frequent bowel movements and bowel/bladder incontinence since having rectal surgery. Good appetite. Chest discomfort improved after discontinuing the metformin.  Objective:  Vital signs in last 24 hours:  Blood pressure 137/82, pulse 66, temperature 97.4 F (36.3 C), temperature source Oral, resp. rate 18, height 5\' 11"  (1.803 m), weight 207 lb 8 oz (94.121 kg), SpO2 100.00%.    HEENT: Neck without mass Lymphatics: No cervical, supraclavicular, axillary, or inguinal nodes Resp: Lungs clear bilaterally Cardio: Regular rate and rhythm GI: No hepatosplenomegaly, nontender, no mass Vascular: No leg edema   Lab Results:  Lab Results  Component Value Date   WBC 3.3* 12/17/2013   HGB 13.3 12/17/2013   HCT 40.6 12/17/2013   MCV 90.3 12/17/2013   PLT 219 12/17/2013   NEUTROABS 1.6 12/17/2013      Lab Results  Component Value Date   CEA <0.5 12/17/2013    Imaging:  Restaging CTs of the chest, abdomen, and pelvis on 12/17/2013-unchanged lower lobe pulmonary nodules, the liver and adrenal glands are unremarkable, postoperative changes in the rectum  Medications: I have reviewed the patient's current medications.  Assessment/Plan: 1.Rectal cancer, stage III (T3 N1b), status post a low anterior resection 01/31/2011, status post radiation with continuous infusion 5-FU 03/19/2011 through 04/30/2011. Status post FOLFOX beginning 07/03/2011 for 8 cycles. Surveillance colonoscopy 02/19/2012-negative for recurrent cancer or polyps   2. Pulmonary embolism March 2012, status post IVC filter placement prior to cancer surgery in April 2012, followed by Dr. Chase Caller. Risk factors for pulmonary embolism felt to be related to obesity, oral contraceptive use, rectal cancer, and sedentary job. Now maintained off of anticoagulation   3.removal of  Port-A-Cath March 2014   4.urinary/fecal incontinence-we made a referral to the outpatient followup with physical therapy program     Disposition:  She remains in clinical remission from rectal cancer. Ms. Schrier will return for an office visit and CEA in 6 months. We will refer her to the pelvic physical therapy program. She is now 3 years out from diagnosis. We do not plan further surveillance CT scans. She will see Dr. Collene Mares for a surveillance colonoscopy.  Betsy Coder, MD  01/14/2014  4:52 PM

## 2014-01-14 NOTE — Telephone Encounter (Signed)
gv and printed appt sched and avs for pt for Sept....PT closed will call again monday and call pt with appt d.t late in afternoon

## 2014-01-18 ENCOUNTER — Other Ambulatory Visit: Payer: 59 | Admitting: Lab

## 2014-01-27 ENCOUNTER — Ambulatory Visit: Payer: 59 | Attending: Oncology | Admitting: Physical Therapy

## 2014-01-27 DIAGNOSIS — C20 Malignant neoplasm of rectum: Secondary | ICD-10-CM | POA: Insufficient documentation

## 2014-01-27 DIAGNOSIS — M629 Disorder of muscle, unspecified: Secondary | ICD-10-CM | POA: Insufficient documentation

## 2014-01-27 DIAGNOSIS — M2420 Disorder of ligament, unspecified site: Secondary | ICD-10-CM | POA: Insufficient documentation

## 2014-01-27 DIAGNOSIS — IMO0001 Reserved for inherently not codable concepts without codable children: Secondary | ICD-10-CM | POA: Insufficient documentation

## 2014-02-02 ENCOUNTER — Ambulatory Visit: Payer: 59 | Admitting: Physical Therapy

## 2014-02-09 ENCOUNTER — Ambulatory Visit: Payer: 59 | Admitting: Physical Therapy

## 2014-02-10 ENCOUNTER — Telehealth: Payer: Self-pay | Admitting: Oncology

## 2014-02-10 NOTE — Telephone Encounter (Signed)
Called Dr. Lorie Apley office left message regarding GI appt, waiting for for call back

## 2014-02-16 ENCOUNTER — Ambulatory Visit: Payer: 59 | Admitting: Physical Therapy

## 2014-02-23 ENCOUNTER — Encounter: Payer: Self-pay | Admitting: *Deleted

## 2014-02-23 ENCOUNTER — Ambulatory Visit: Payer: 59 | Attending: Oncology | Admitting: Physical Therapy

## 2014-02-23 DIAGNOSIS — M2420 Disorder of ligament, unspecified site: Secondary | ICD-10-CM | POA: Insufficient documentation

## 2014-02-23 DIAGNOSIS — C20 Malignant neoplasm of rectum: Secondary | ICD-10-CM | POA: Insufficient documentation

## 2014-02-23 DIAGNOSIS — M629 Disorder of muscle, unspecified: Secondary | ICD-10-CM | POA: Insufficient documentation

## 2014-02-23 DIAGNOSIS — IMO0001 Reserved for inherently not codable concepts without codable children: Secondary | ICD-10-CM | POA: Insufficient documentation

## 2014-02-23 NOTE — Progress Notes (Signed)
Call to Dr. Lorie Apley office: Scheduled to be seen there on 03/01/14.

## 2014-03-08 ENCOUNTER — Ambulatory Visit: Payer: 59 | Admitting: Physical Therapy

## 2014-03-22 ENCOUNTER — Ambulatory Visit: Payer: 59 | Attending: Oncology | Admitting: Physical Therapy

## 2014-03-22 DIAGNOSIS — IMO0001 Reserved for inherently not codable concepts without codable children: Secondary | ICD-10-CM | POA: Insufficient documentation

## 2014-03-22 DIAGNOSIS — M2420 Disorder of ligament, unspecified site: Secondary | ICD-10-CM | POA: Insufficient documentation

## 2014-03-22 DIAGNOSIS — M629 Disorder of muscle, unspecified: Secondary | ICD-10-CM | POA: Insufficient documentation

## 2014-03-22 DIAGNOSIS — C20 Malignant neoplasm of rectum: Secondary | ICD-10-CM | POA: Insufficient documentation

## 2014-04-05 ENCOUNTER — Ambulatory Visit: Payer: 59 | Admitting: Physical Therapy

## 2014-04-19 ENCOUNTER — Ambulatory Visit: Payer: 59 | Admitting: Physical Therapy

## 2014-05-16 ENCOUNTER — Ambulatory Visit: Payer: 59 | Admitting: Physical Therapy

## 2014-05-24 ENCOUNTER — Ambulatory Visit: Payer: 59 | Attending: Oncology | Admitting: Physical Therapy

## 2014-05-24 DIAGNOSIS — M2420 Disorder of ligament, unspecified site: Secondary | ICD-10-CM | POA: Insufficient documentation

## 2014-05-24 DIAGNOSIS — C20 Malignant neoplasm of rectum: Secondary | ICD-10-CM | POA: Diagnosis not present

## 2014-05-24 DIAGNOSIS — IMO0001 Reserved for inherently not codable concepts without codable children: Secondary | ICD-10-CM | POA: Diagnosis not present

## 2014-05-24 DIAGNOSIS — M629 Disorder of muscle, unspecified: Secondary | ICD-10-CM | POA: Diagnosis not present

## 2014-05-27 ENCOUNTER — Other Ambulatory Visit: Payer: Self-pay

## 2014-05-27 DIAGNOSIS — Z1231 Encounter for screening mammogram for malignant neoplasm of breast: Secondary | ICD-10-CM

## 2014-05-30 NOTE — Telephone Encounter (Signed)
Phone call - encounter closed. 

## 2014-06-02 ENCOUNTER — Ambulatory Visit
Admission: RE | Admit: 2014-06-02 | Discharge: 2014-06-02 | Disposition: A | Discharge status: Home / Self Care | Payer: 59 | Source: Ambulatory Visit

## 2014-06-02 DIAGNOSIS — Z1231 Encounter for screening mammogram for malignant neoplasm of breast: Secondary | ICD-10-CM

## 2014-07-11 ENCOUNTER — Encounter: Payer: Self-pay | Admitting: Cardiology

## 2014-07-11 ENCOUNTER — Ambulatory Visit (INDEPENDENT_AMBULATORY_CARE_PROVIDER_SITE_OTHER): Payer: 59 | Admitting: Cardiology

## 2014-07-11 VITALS — BP 122/60 | HR 67 | Ht 71.0 in | Wt 215.0 lb

## 2014-07-11 DIAGNOSIS — I2699 Other pulmonary embolism without acute cor pulmonale: Secondary | ICD-10-CM

## 2014-07-11 DIAGNOSIS — R0600 Dyspnea, unspecified: Secondary | ICD-10-CM

## 2014-07-11 DIAGNOSIS — Z8249 Family history of ischemic heart disease and other diseases of the circulatory system: Secondary | ICD-10-CM

## 2014-07-11 DIAGNOSIS — R0609 Other forms of dyspnea: Secondary | ICD-10-CM

## 2014-07-11 DIAGNOSIS — R0989 Other specified symptoms and signs involving the circulatory and respiratory systems: Secondary | ICD-10-CM

## 2014-07-11 DIAGNOSIS — E785 Hyperlipidemia, unspecified: Secondary | ICD-10-CM

## 2014-07-11 DIAGNOSIS — R079 Chest pain, unspecified: Secondary | ICD-10-CM

## 2014-07-11 NOTE — Progress Notes (Signed)
Patient ID: [name], female   DOB: Mar 19, 1966, 48 y.o.   MRN: [id number]    Patient Name: [name] Date of Encounter: 07/11/2014  Primary Care Provider:  Salena Saner., MD Primary Cardiologist: Dorothy Spark (Dr. Verl Blalock)  Problem List   Past Medical History  Diagnosis Date  . Hyperlipidemia   . Pulmonary embolism   . STD (sexually transmitted disease) 1991    POS GC-TREATED  . Diabetes mellitus     TYPE 2/diet controlled  . Abnormal FSH level 02/2012    value 53  . Cancer     rectal ca dx'd January 26, 2011  . Cancer of colon with rectum   . Headache(784.0)     experiences migraine every couple of weeks. takes only excedrin   Past Surgical History  Procedure Laterality Date  . None    . Rectal tumor by proctotomy excision    . Portacath placement  02/21/11    tip in mid [id number]  . Cesarean section  Jan 26, 2004  . Colon surgery    . Insertion of vena cava filter Right 01/26/11  . Port-a-cath removal Right 12/22/2012    Procedure: REMOVAL PORT-A-CATH;  Surgeon: Odis Hollingshead, MD;  Location: [facility];  Service: General;  Laterality: Right;    Allergies  Allergies  Allergen Reactions  . Penicillins Hives   HPI  A very pleasant 48 year old female who is coming for a follow up for family h/o hypertrophic cardiomyopathy. She saw Dr Verl Blalock in 01-25-05 after her father died of SCD and autopsy showed HCM. Both her and her brother had ECG and echocardiogram at that time and it was not suggestive for HCM.  She also has h/o DM type 2, B/L pulmonary embolism in Jan 26, 2011, hyperlipidemia on statins, hypothyroidism and rectal ca in Jan 26, 2011, s/p resection. PE was at the time of colon cancer.  She has noticed lately that she gets SOB with moderate activity like attending zumba class and feels overall more fatigued.   Home Medications  Prior to Admission medications   Medication Sig Start Date End Date Taking? Authorizing Provider  aspirin 81 MG tablet Take 81 mg by mouth  daily.   Yes Historical Provider, MD  aspirin-acetaminophen-caffeine (EXCEDRIN MIGRAINE) [phone] MG per tablet Take 2 tablets by mouth every 6 (six) hours as needed. For migraines   Yes Historical Provider, MD  cholecalciferol (VITAMIN D) 400 UNITS TABS tablet Take 400 Units by mouth.   Yes Historical Provider, MD  CINNAMON PO Take by mouth. For blood sugar   Yes Historical Provider, MD  cyanocobalamin 500 MCG tablet Take 500 mcg by mouth daily.   Yes Historical Provider, MD  levothyroxine (SYNTHROID, LEVOTHROID) 50 MCG tablet Take 50 mcg by mouth daily before breakfast.  06/06/13  Yes Historical Provider, MD  pyridOXINE (VITAMIN B-6) 100 MG tablet Take 100 mg by mouth 2 (two) times daily.   Yes Historical Provider, MD  simvastatin (ZOCOR) 10 MG tablet Take 10 mg by mouth at bedtime.   Yes Historical Provider, MD    Family History  Family History  Problem Relation Age of Onset  . Heart disease Father   . Lung cancer Mother   . Brain cancer Mother   . Cancer Mother     lung and brain  . Diabetes Mother   . Hypertension Mother   . Cervical cancer Paternal Grandmother   . Cancer Paternal Grandmother     CERVICAL CANCER  . Diabetes Brother   .  Kidney disease Brother     Social History  History   Social History  . Marital Status: Married    Spouse Name: N/A    Number of Children: 1  . Years of Education: N/A   Occupational History  . claims adjuster    Social History Main Topics  . Smoking status: Never Smoker   . Smokeless tobacco: Never Used  . Alcohol Use: No     Comment: rare- twice a year  . Drug Use: No  . Sexual Activity: No     Comment: no periods due to radiation   Other Topics Concern  . Not on file   Social History Narrative  . No narrative on file     Review of Systems, as per HPI, otherwise negative General:  No chills, fever, night sweats or weight changes.  Cardiovascular:  No chest pain, dyspnea on exertion, edema, orthopnea, palpitations,  paroxysmal nocturnal dyspnea. Dermatological: No rash, lesions/masses Respiratory: No cough, dyspnea Urologic: No hematuria, dysuria Abdominal:   No nausea, vomiting, diarrhea, bright red blood per rectum, melena, or hematemesis Neurologic:  No visual changes, wkns, changes in mental status. All other systems reviewed and are otherwise negative except as noted above.  Physical Exam  Blood pressure 122/60, pulse 67, height 5\' 11"  (1.803 m), weight 215 lb (97.523 kg).  General: Pleasant, NAD Psych: Normal affect. Neuro: Alert and oriented X 3. Moves all extremities spontaneously. HEENT: Normal  Neck: Supple without bruits or JVD. Lungs:  Resp regular and unlabored, CTA. Heart: RRR no s3, s4, or murmurs. Abdomen: Soft, non-tender, non-distended, BS + x 4.  Extremities: No clubbing, cyanosis or edema. DP/PT/Radials 2+ and equal bilaterally.  Labs:  No results found for this basename: CKTOTAL, CKMB, TROPONINI,  in the last 72 hours Lab Results  Component Value Date   WBC 3.3* 12/17/2013   HGB 13.3 12/17/2013   HCT 40.6 12/17/2013   MCV 90.3 12/17/2013   PLT 219 12/17/2013    Lab Results  Component Value Date   DDIMER <0.27 08/07/2012   No components found with this basename: POCBNP,     Component Value Date/Time   NA 142 12/17/2013 1538   NA 140 12/21/2012 1530   NA 143 06/01/2012 1604   K 4.1 12/17/2013 1538   K 4.1 12/21/2012 1530   K 4.3 06/01/2012 1604   CL 101 12/21/2012 1530   CL 106 12/07/2012 0838   CL 100 06/01/2012 1604   CO2 28 12/17/2013 1538   CO2 30 12/21/2012 1530   CO2 30 06/01/2012 1604   GLUCOSE 123 12/17/2013 1538   GLUCOSE 129* 12/21/2012 1530   GLUCOSE 119* 12/07/2012 0838   GLUCOSE 100 06/01/2012 1604   BUN 14.1 12/17/2013 1538   BUN 17 12/21/2012 1530   BUN 14 06/01/2012 1604   CREATININE 0.8 12/17/2013 1538   CREATININE 0.80 12/21/2012 1530   CREATININE 1.0 06/01/2012 1604   CALCIUM 9.7 12/17/2013 1538   CALCIUM 9.9 12/21/2012 1530   CALCIUM 9.8 06/01/2012 1604   PROT 7.7  12/17/2013 1538   PROT 7.3 08/07/2012 1420   PROT 7.7 06/01/2012 1604   ALBUMIN 4.0 12/17/2013 1538   ALBUMIN 3.8 08/07/2012 1420   AST 15 12/17/2013 1538   AST 20 08/07/2012 1420   AST 21 06/01/2012 1604   ALT 19 12/17/2013 1538   ALT 21 08/07/2012 1420   ALT 30 06/01/2012 1604   ALKPHOS 147 12/17/2013 1538   ALKPHOS 114 08/07/2012 1420   ALKPHOS 112*  06/01/2012 1604   BILITOT 0.30 12/17/2013 1538   BILITOT 0.3 08/07/2012 1420   BILITOT 0.60 06/01/2012 1604   GFRNONAA 88* 12/21/2012 1530   GFRAA >90 12/21/2012 1530   No results found for this basename: CHOL, HDL, LDLCALC, TRIG   Accessory Clinical Findings  Echocardiogram - none  ECG - normal sinus rhythm with sinus arrhythmia, 67 beats per minute    Assessment & Plan  48 year old female   1. DOE - risk factors include obesity, DM, hyperlipidemia, normal ECG, we will schedule exercise treadmill stress test. Continue asa and simvastatin.  2. BP - controlled.  3. FH of HCM - ECG not suggestive, however we will order echocardiogram as HCM can present later in life, last tested at age 58.  58. Hyperlipidemia - check lipids and CMP on the day of stress test.   Follow up in 2 months.  Dorothy Spark, MD, [facility] 07/11/2014, 3:05 PM

## 2014-07-11 NOTE — Patient Instructions (Signed)
Your physician recommends that you continue on your current medications as directed. Please refer to the Current Medication list given to you today.   Your physician has requested that you have an echocardiogram. Echocardiography is a painless test that uses sound waves to create images of your heart. It provides your doctor with information about the size and shape of your heart and how well your heart's chambers and valves are working. This procedure takes approximately one hour. There are no restrictions for this procedure.   Your physician has requested that you have an exercise tolerance test. For further information please visit [email or web address]. Please also follow instruction sheet, as given.   Your physician recommends that you return for lab work in: GET THIS DONE ON THE DAYS YOUR ARE SCHEDULED TO COME IN FOR YOUR EXERCISE TOLERANCE TEST AND ECHOCARDIOGRAM  (LIPIDS AND CMP)  PLEASE BE FASTING    Your physician recommends that you schedule a follow-up appointment in: [place] DR Meda Coffee AFTER ALL YOUR TEST ARE COMPLETED

## 2014-07-13 DIAGNOSIS — R0600 Dyspnea, unspecified: Secondary | ICD-10-CM | POA: Insufficient documentation

## 2014-07-13 DIAGNOSIS — R0609 Other forms of dyspnea: Secondary | ICD-10-CM | POA: Insufficient documentation

## 2014-07-13 DIAGNOSIS — E785 Hyperlipidemia, unspecified: Secondary | ICD-10-CM | POA: Insufficient documentation

## 2014-07-13 DIAGNOSIS — Z8249 Family history of ischemic heart disease and other diseases of the circulatory system: Secondary | ICD-10-CM | POA: Insufficient documentation

## 2014-07-14 ENCOUNTER — Telehealth: Payer: Self-pay | Admitting: Nurse Practitioner

## 2014-07-14 NOTE — Telephone Encounter (Signed)
Pt called to rs

## 2014-07-15 ENCOUNTER — Other Ambulatory Visit: Payer: 59

## 2014-07-15 ENCOUNTER — Ambulatory Visit: Payer: 59 | Admitting: Nurse Practitioner

## 2014-07-19 ENCOUNTER — Ambulatory Visit (HOSPITAL_BASED_OUTPATIENT_CLINIC_OR_DEPARTMENT_OTHER): Payer: 59 | Admitting: Nurse Practitioner

## 2014-07-19 ENCOUNTER — Other Ambulatory Visit (HOSPITAL_BASED_OUTPATIENT_CLINIC_OR_DEPARTMENT_OTHER): Payer: 59

## 2014-07-19 ENCOUNTER — Telehealth: Payer: Self-pay | Admitting: Oncology

## 2014-07-19 VITALS — BP 142/91 | HR 64 | Temp 98.4°F | Resp 18 | Ht 71.0 in | Wt 215.0 lb

## 2014-07-19 DIAGNOSIS — Z86711 Personal history of pulmonary embolism: Secondary | ICD-10-CM

## 2014-07-19 DIAGNOSIS — C20 Malignant neoplasm of rectum: Secondary | ICD-10-CM

## 2014-07-19 DIAGNOSIS — Z85048 Personal history of other malignant neoplasm of rectum, rectosigmoid junction, and anus: Secondary | ICD-10-CM

## 2014-07-19 LAB — CEA

## 2014-07-19 NOTE — Telephone Encounter (Signed)
gv and printed appt sched and avs forpt for March 2016 °

## 2014-07-19 NOTE — Progress Notes (Signed)
  [facility] OFFICE PROGRESS NOTE   Diagnosis:  Rectal cancer  INTERVAL HISTORY:   [name] returns as scheduled. She participated in the pelvic physical therapy program. She notes less incontinence and decreased frequency of bowel movements. No pain with bowel movements. No bloody or black stools. She has a good appetite. No shortness of breath. She continues to have intermittent chest pain. She has been referred to cardiology.  Objective:  Vital signs in last 24 hours:  Blood pressure 142/91, pulse 64, temperature 98.4 F (36.9 C), temperature source Oral, resp. rate 18, height 5\' 11"  (1.803 m), weight 215 lb (97.523 kg), SpO2 100.00%.    HEENT: No thrush or ulcers. Lymphatics: No palpable cervical, supraclavicular, axillary or inguinal lymph nodes. Resp: Lungs clear bilaterally. Cardio: Regular rate and rhythm. GI: Abdomen soft and nontender. No hepatomegaly. Vascular: No leg edema.     Lab Results:  Lab Results  Component Value Date   WBC 3.3* 12/17/2013   HGB 13.3 12/17/2013   HCT 40.6 12/17/2013   MCV 90.3 12/17/2013   PLT 219 12/17/2013   NEUTROABS 1.6 12/17/2013    Imaging:  No results found.  Medications: I have reviewed the patient's current medications.  Assessment/Plan: 1. Rectal cancer, stage III (T3 N1b), status post a low anterior resection 01/31/2011, status post radiation with continuous infusion 5-FU 03/19/2011 through 04/30/2011. Status post FOLFOX beginning 07/03/2011 for 8 cycles. Surveillance colonoscopy 02/19/2012-negative for recurrent cancer or polyps. Surveillance colonoscopy 04/15/2014-entire examined colon normal; terminal ileum appeared normal. Next colonoscopy recommended at a 5 year interval. 2. Pulmonary embolism March 2012, status post IVC filter placement prior to cancer surgery in April 2012, followed by Dr. Chase Caller. Risk factors for pulmonary embolism felt to be related to obesity, oral contraceptive use, rectal cancer,  and sedentary job. Now maintained off of anticoagulation  3. Removal of Port-A-Cath March 2014 4. Urinary/fecal incontinence. Improved with the pelvic physical therapy program.   Disposition: [name] remains in clinical remission from rectal cancer. We will followup on the CEA from today. She will return for a followup visit and CEA in 6 months. She will contact the office in the interim with any problems.    Ned Card ANP/GNP-BC   07/19/2014  3:33 PM

## 2014-07-20 ENCOUNTER — Telehealth: Payer: Self-pay | Admitting: *Deleted

## 2014-07-20 NOTE — Telephone Encounter (Signed)
Called pt with normal CEA results. She voiced understanding. 

## 2014-07-20 NOTE — Telephone Encounter (Signed)
Message copied by Brien Few on Wed Jul 20, 2014  9:08 AM ------      Message from: Betsy Coder B      Created: Wed Jul 20, 2014  8:57 AM       Please call patient cea is normal ------

## 2014-08-10 ENCOUNTER — Ambulatory Visit (INDEPENDENT_AMBULATORY_CARE_PROVIDER_SITE_OTHER): Payer: 59 | Admitting: Physician Assistant

## 2014-08-10 ENCOUNTER — Ambulatory Visit (HOSPITAL_COMMUNITY): Payer: 59 | Attending: Cardiology | Admitting: Cardiology

## 2014-08-10 ENCOUNTER — Other Ambulatory Visit (INDEPENDENT_AMBULATORY_CARE_PROVIDER_SITE_OTHER): Payer: 59 | Admitting: *Deleted

## 2014-08-10 DIAGNOSIS — R079 Chest pain, unspecified: Secondary | ICD-10-CM

## 2014-08-10 DIAGNOSIS — I2699 Other pulmonary embolism without acute cor pulmonale: Secondary | ICD-10-CM

## 2014-08-10 DIAGNOSIS — E669 Obesity, unspecified: Secondary | ICD-10-CM | POA: Diagnosis not present

## 2014-08-10 DIAGNOSIS — E119 Type 2 diabetes mellitus without complications: Secondary | ICD-10-CM | POA: Diagnosis not present

## 2014-08-10 DIAGNOSIS — E785 Hyperlipidemia, unspecified: Secondary | ICD-10-CM | POA: Insufficient documentation

## 2014-08-10 LAB — COMPREHENSIVE METABOLIC PANEL
ALT: 30 U/L (ref 0–35)
AST: 31 U/L (ref 0–37)
Albumin: 3.7 g/dL (ref 3.5–5.2)
Alkaline Phosphatase: 115 U/L (ref 39–117)
BUN: 18 mg/dL (ref 6–23)
CO2: 21 mEq/L (ref 19–32)
Calcium: 9.4 mg/dL (ref 8.4–10.5)
Chloride: 107 mEq/L (ref 96–112)
Creatinine, Ser: 1 mg/dL (ref 0.4–1.2)
GFR: 77.17 mL/min (ref >60.00)
Glucose, Bld: 117 mg/dL — ABNORMAL HIGH (ref 70–99)
Potassium: 3.9 mEq/L (ref 3.5–5.1)
Sodium: 139 mEq/L (ref 135–145)
Total Bilirubin: 0.8 mg/dL (ref 0.2–1.2)
Total Protein: 8 g/dL (ref 6.0–8.3)

## 2014-08-10 LAB — LIPID PANEL
Cholesterol: 175 mg/dL (ref 0–200)
HDL: 48.3 mg/dL (ref >39.00)
LDL Cholesterol: 107 mg/dL — ABNORMAL HIGH (ref 0–99)
NonHDL: 126.7
Total CHOL/HDL Ratio: 4
Triglycerides: 99 mg/dL (ref 0.0–149.0)
VLDL: 19.8 mg/dL (ref 0.0–40.0)

## 2014-08-10 NOTE — Progress Notes (Signed)
Exercise Treadmill Test  Pre-Exercise Testing Evaluation Rhythm: normal sinus  Rate: 65 bpm     Test  Exercise Tolerance Test Ordering MD: Ena Dawley, MD  Interpreting MD: Richardson Dopp, PA-C  Unique Test No: 1  Treadmill:  1  Indication for ETT: chest pain - rule out ischemia  Contraindication to ETT: No   Stress Modality: exercise - treadmill  Cardiac Imaging Performed: non   Protocol: standard Bruce - maximal  Max BP:  200/88  Max MPHR (bpm):  173 85% MPR (bpm):  147  MPHR obtained (bpm):  164 % MPHR obtained:  94  Reached 85% MPHR (min:sec):  5:45 Total Exercise Time (min-sec):  8:00  Workload in METS:  10.0 Borg Scale: 17  Reason ETT Terminated:  desired heart rate attained    ST Segment Analysis At Rest: normal ST segments - no evidence of significant ST depression With Exercise: non-specific ST changes  Other Information Arrhythmia:  No Angina during ETT:  absent (0) Quality of ETT:  diagnostic  ETT Interpretation:  normal - no evidence of ischemia by ST analysis  Comments: Good exercise capacity. No chest pain. Normal BP response to exercise. There was increased motion artifact. There was non-specific up-sloping ST depression.  No ST changes to suggest ischemia.  No exercise induced ventricular arrhythmias.   Recommendations: FU with Dr. Ena Dawley as directed. Signed,  Richardson Dopp, PA-C   08/10/2014 10:42 AM

## 2014-08-10 NOTE — Progress Notes (Signed)
Echo performed. 

## 2014-08-19 ENCOUNTER — Encounter: Payer: Self-pay | Admitting: *Deleted

## 2014-08-22 ENCOUNTER — Encounter: Payer: Self-pay | Admitting: Cardiology

## 2014-08-22 ENCOUNTER — Ambulatory Visit (INDEPENDENT_AMBULATORY_CARE_PROVIDER_SITE_OTHER): Payer: 59 | Admitting: Cardiology

## 2014-08-22 VITALS — BP 112/70 | HR 57 | Ht 71.0 in | Wt 219.0 lb

## 2014-08-22 DIAGNOSIS — R0789 Other chest pain: Secondary | ICD-10-CM

## 2014-08-22 DIAGNOSIS — R079 Chest pain, unspecified: Secondary | ICD-10-CM | POA: Insufficient documentation

## 2014-08-22 NOTE — Patient Instructions (Signed)
Your physician wants you to follow-up in:  [employer] will receive a reminder letter in the mail two months in advance. If you don't receive a letter, please call our office to schedule the follow-up appointment.  Your physician recommends that you continue on your current medications as directed. Please refer to the Current Medication list given to you today.

## 2014-08-22 NOTE — Progress Notes (Signed)
Patient ID: [name], female   DOB: [birth year], 48 y.o.   MRN: [id number] Patient ID: [name], female   DOB: [birth year], 48 y.o.   MRN: [id number]    Patient Name: [name] Date of Encounter: 08/22/2014  Primary Care Provider:  Salena Saner., MD Primary Cardiologist: Dorothy Spark (Dr. Verl Blalock)  Problem List   Past Medical History  Diagnosis Date  . Hyperlipidemia   . Pulmonary embolism   . STD (sexually transmitted disease) 1991    POS GC-TREATED  . Diabetes mellitus type 2, diet-controlled   . Abnormal FSH level 02/2012    value 53  . Cancer of colon with rectum 2011-02-10  . Headache(784.0)     experiences migraine every couple of weeks. takes only excedrin   Past Surgical History  Procedure Laterality Date  . None    . Rectal tumor by proctotomy excision    . Portacath placement  02/21/11    tip in mid [id number]-rosenbower  . Cesarean section  02-10-2004  . Colon surgery    . Insertion of vena cava filter Right 10-Feb-2011  . Port-a-cath removal Right 12/22/2012    Procedure: REMOVAL PORT-A-CATH;  Surgeon: Odis Hollingshead, MD;  Location: [facility];  Service: General;  Laterality: Right;    Allergies  Allergies  Allergen Reactions  . Penicillins Hives   HPI  A very pleasant 48 year old female who is coming for a follow up for family h/o hypertrophic cardiomyopathy. She saw Dr Verl Blalock in 02/09/2005 after her father died of SCD and autopsy showed HCM. Both her and her brother had ECG and echocardiogram at that time and it was not suggestive for HCM.  She also has h/o DM type 2, B/L pulmonary embolism in 02/10/11, hyperlipidemia on statins, hypothyroidism and rectal ca in 02/10/2011, s/p resection. PE was at the time of colon cancer.  She has noticed lately that she gets SOB with moderate activity like attending zumba class and feels overall more fatigued.   6 week follow up - normal echo and normal stress echo. She states she continues having chest pain especially after  food. It's squeezing like retrosternal chest pain with no radiation. No association with dyspnea on exertion dizziness or syncope.  Home Medications  Prior to Admission medications   Medication Sig Start Date End Date Taking? Authorizing Provider  aspirin 81 MG tablet Take 81 mg by mouth daily.   Yes Historical Provider, MD  aspirin-acetaminophen-caffeine (EXCEDRIN MIGRAINE) [phone] MG per tablet Take 2 tablets by mouth every 6 (six) hours as needed. For migraines   Yes Historical Provider, MD  cholecalciferol (VITAMIN D) 400 UNITS TABS tablet Take 400 Units by mouth.   Yes Historical Provider, MD  CINNAMON PO Take by mouth. For blood sugar   Yes Historical Provider, MD  cyanocobalamin 500 MCG tablet Take 500 mcg by mouth daily.   Yes Historical Provider, MD  levothyroxine (SYNTHROID, LEVOTHROID) 50 MCG tablet Take 50 mcg by mouth daily before breakfast.  06/06/13  Yes Historical Provider, MD  pyridOXINE (VITAMIN B-6) 100 MG tablet Take 100 mg by mouth 2 (two) times daily.   Yes Historical Provider, MD  simvastatin (ZOCOR) 10 MG tablet Take 10 mg by mouth at bedtime.   Yes Historical Provider, MD    Family History  Family History  Problem Relation Age of Onset  . Heart disease Father   . Lung cancer Mother   . Brain cancer Mother   . Diabetes Mother   .  Hypertension Mother   . Cervical cancer Paternal Grandmother   . Diabetes Brother   . Kidney disease Brother     Social History  History   Social History  . Marital Status: Married    Spouse Name: N/A    Number of Children: 1  . Years of Education: N/A   Occupational History  . claims adjuster    Social History Main Topics  . Smoking status: Never Smoker   . Smokeless tobacco: Never Used  . Alcohol Use: No     Comment: rare- twice a year  . Drug Use: No  . Sexual Activity: No     Comment: no periods due to radiation   Other Topics Concern  . Not on file   Social History Narrative     Review of Systems, as  per HPI, otherwise negative General:  No chills, fever, night sweats or weight changes.  Cardiovascular:  No chest pain, dyspnea on exertion, edema, orthopnea, palpitations, paroxysmal nocturnal dyspnea. Dermatological: No rash, lesions/masses Respiratory: No cough, dyspnea Urologic: No hematuria, dysuria Abdominal:   No nausea, vomiting, diarrhea, bright red blood per rectum, melena, or hematemesis Neurologic:  No visual changes, wkns, changes in mental status. All other systems reviewed and are otherwise negative except as noted above.  Physical Exam  Blood pressure 112/70, pulse 57, height 5\' 11"  (1.803 m), weight 219 lb (99.338 kg), SpO2 99 %.  General: Pleasant, NAD Psych: Normal affect. Neuro: Alert and oriented X 3. Moves all extremities spontaneously. HEENT: Normal  Neck: Supple without bruits or JVD. Lungs:  Resp regular and unlabored, CTA. Heart: RRR no s3, s4, or murmurs. Abdomen: Soft, non-tender, non-distended, BS + x 4.  Extremities: No clubbing, cyanosis or edema. DP/PT/Radials 2+ and equal bilaterally.  Labs:  No results for input(s): CKTOTAL, CKMB, TROPONINI in the last 72 hours. Lab Results  Component Value Date   WBC 3.3* 12/17/2013   HGB 13.3 12/17/2013   HCT 40.6 12/17/2013   MCV 90.3 12/17/2013   PLT 219 12/17/2013    Lab Results  Component Value Date   DDIMER <0.27 08/07/2012   Invalid input(s): POCBNP    Component Value Date/Time   NA 139 08/10/2014 1053   NA 142 12/17/2013 1538   NA 143 06/01/2012 1604   K 3.9 08/10/2014 1053   K 4.1 12/17/2013 1538   K 4.3 06/01/2012 1604   CL 107 08/10/2014 1053   CL 106 12/07/2012 0838   CL 100 06/01/2012 1604   CO2 21 08/10/2014 1053   CO2 28 12/17/2013 1538   CO2 30 06/01/2012 1604   GLUCOSE 117* 08/10/2014 1053   GLUCOSE 123 12/17/2013 1538   GLUCOSE 119* 12/07/2012 0838   GLUCOSE 100 06/01/2012 1604   BUN 18 08/10/2014 1053   BUN 14.1 12/17/2013 1538   BUN 14 06/01/2012 1604   CREATININE 1.0  08/10/2014 1053   CREATININE 0.8 12/17/2013 1538   CREATININE 1.0 06/01/2012 1604   CALCIUM 9.4 08/10/2014 1053   CALCIUM 9.7 12/17/2013 1538   CALCIUM 9.8 06/01/2012 1604   PROT 8.0 08/10/2014 1053   PROT 7.7 12/17/2013 1538   PROT 7.7 06/01/2012 1604   ALBUMIN 3.7 08/10/2014 1053   ALBUMIN 4.0 12/17/2013 1538   AST 31 08/10/2014 1053   AST 15 12/17/2013 1538   AST 21 06/01/2012 1604   ALT 30 08/10/2014 1053   ALT 19 12/17/2013 1538   ALT 30 06/01/2012 1604   ALKPHOS 115 08/10/2014 1053   ALKPHOS 147  12/17/2013 1538   ALKPHOS 112* 06/01/2012 1604   BILITOT 0.8 08/10/2014 1053   BILITOT 0.30 12/17/2013 1538   BILITOT 0.60 06/01/2012 1604   GFRNONAA 88* 12/21/2012 1530   GFRAA >90 12/21/2012 1530   Lab Results  Component Value Date   CHOL 175 08/10/2014   Accessory Clinical Findings  Echocardiogram - 08/10/2014 Study Conclusions  - Left ventricle: The cavity size was normal. Systolic function was normal. The estimated ejection fraction was in the range of 60% to 65%. Wall motion was normal; there were no regional wall motion abnormalities. There was an increased relative contribution of atrial contraction to ventricular filling. Doppler parameters are consistent with abnormal left ventricular relaxation (grade 1 diastolic dysfunction). - Mitral valve: There was mild regurgitation. - Tricuspid valve: There was trivial regurgitation.  ECG - normal sinus rhythm with sinus arrhythmia, 67 beats per minute  Exercise treadmill stress test: 08/10/2014 Comments: Good exercise capacity. No chest pain. Normal BP response to exercise. There was increased motion artifact. There was non-specific up-sloping ST depression.  No ST changes to suggest ischemia.  No exercise induced ventricular arrhythmias.     Assessment & Plan  48 year old female   1. Chest pain - normal echocardiogram, normal stress echocardiogram with good exertional capacity and no symptoms.  The patient chest pain is actually nonexertional and most like postprandial. Differential diagnosis in a diabetic patient is broad and include decreased GI motility, is aphasia spasm or gastric reflux disease. The patient is advised to follow with GI doctor. She has one since she has history of colon cancer.  2. Cardiovascular preventive care - considering patient has risk factors that include diabetes and obesity we strongly advised to continue aspirin and statin. Her lipids are satisfactory she is advised to further: Continue exercising and improved slightly her diet. No change in her medicines right now.  3. BP - controlled.  4. FH of HCM - ECG not suggestive, echocardiogram normal. Since she hasn't developed phenotype of HCM till now the chances overload that she will do that in the future.  4. Hyperlipidemia - As above.   Follow up in one year.   Dorothy Spark, MD, [facility] 08/22/2014, 3:58 PM

## 2014-09-08 ENCOUNTER — Other Ambulatory Visit (HOSPITAL_COMMUNITY)
Admission: RE | Admit: 2014-09-08 | Discharge: 2014-09-08 | Disposition: A | Discharge status: Home / Self Care | Payer: 59 | Source: Ambulatory Visit | Attending: Women's Health | Admitting: Women's Health

## 2014-09-08 ENCOUNTER — Encounter: Payer: Self-pay | Admitting: Women's Health

## 2014-09-08 ENCOUNTER — Ambulatory Visit (INDEPENDENT_AMBULATORY_CARE_PROVIDER_SITE_OTHER): Payer: 59 | Admitting: Women's Health

## 2014-09-08 VITALS — BP 138/80 | Ht 71.0 in | Wt 217.0 lb

## 2014-09-08 DIAGNOSIS — Z01419 Encounter for gynecological examination (general) (routine) without abnormal findings: Secondary | ICD-10-CM

## 2014-09-08 DIAGNOSIS — R87810 Cervical high risk human papillomavirus (HPV) DNA test positive: Secondary | ICD-10-CM | POA: Insufficient documentation

## 2014-09-08 DIAGNOSIS — Z1151 Encounter for screening for human papillomavirus (HPV): Secondary | ICD-10-CM | POA: Insufficient documentation

## 2014-09-08 NOTE — Progress Notes (Signed)
[name] 08/16/1966 [id number]    History:    Presents for annual exam.  2012 diagnosed with rectal cancer surgically removed/chemotherapy/radiation. Has had problems with incontinence doing better after physical therapy. Amenorrheic, [place] 48 2013. Normal mammogram and Pap history. Negative colonoscopy 03/2014 reports next due  2020. Type 2 diabetes and hypothyroid managed by Dr. Chalmers Cater.   Past medical history, past surgical history, family history and social history were all reviewed and documented in the EPIC chart. Works from home for [employer]. Biggest stressor 42 year old son diagnosed with type 1 diabetes age 38 who is not compliant with care. 58 year old son and 66 year old son both doing well. Poor relationship with husband/not sexually active.  ROS:  A  12 point ROS was performed and pertinent positives and negatives are included.  Exam:  Filed Vitals:   09/08/14 1530  BP: 138/80    General appearance:  Normal Thyroid:  Symmetrical, normal in size, without palpable masses or nodularity. Respiratory  Auscultation:  Clear without wheezing or rhonchi Cardiovascular  Auscultation:  Regular rate, without rubs, murmurs or gallops  Edema/varicosities:  Not grossly evident Abdominal  Soft,nontender, without masses, guarding or rebound.  Liver/spleen:  No organomegaly noted  Hernia:  None appreciated  Skin  Inspection:  Grossly normal   Breasts: Examined lying and sitting.     Right: Without masses, retractions, discharge or axillary adenopathy.     Left: Without masses, retractions, discharge or axillary adenopathy. Gentitourinary   Inguinal/mons:  Normal without inguinal adenopathy  External genitalia:  Normal  BUS/Urethra/Skene's glands:  Normal  Vagina:  Normal  Cervix:  Normal  Uterus:   normal in size, shape and contour.  Midline and mobile  Adnexa/parametria:     Rt: Without masses or tenderness.   Lt: Without masses or tenderness.  Anus and  perineum: Normal  Digital rectal exam: Not done  Assessment/Plan:  48 y.o. MBF G6P3 for annual exam.    Situational stress-family dynamics 2012 rectal cancer - surgery/chemotherapy/radiation has had physical therapy with better relief of GI incontinence History of pulmonary embolism 2012 Postmenopausal with no bleeding Type 2 diabetes/hypothyroid- Dr. Chalmers Cater manages labs and meds  Plan: SBE's, continue annual screening mammogram, calcium rich diet, vitamin D 2000 daily encouraged. Instructed to have vitamin D level checked at next follow-up. Strongly encouraged counseling, Sonda Primes name and number given instructed to schedule. Pap normal 2013, Pap with high risk HPV typing pending, new screening guidelines reviewed. Encouraged to increase exercise and decrease calories for weight loss. Instructed to call if any further bleeding.    [name], 4:51 PM 09/08/2014

## 2014-09-09 LAB — URINALYSIS W MICROSCOPIC + REFLEX CULTURE
Bilirubin Urine: NEGATIVE
CASTS: NONE SEEN
CRYSTALS: NONE SEEN
GLUCOSE, UA: NEGATIVE mg/dL
HGB URINE DIPSTICK: NEGATIVE
KETONES UR: NEGATIVE mg/dL
Nitrite: NEGATIVE
Protein, ur: NEGATIVE mg/dL
SPECIFIC GRAVITY, URINE: 1.022 (ref 1.005–1.030)
Squamous Epithelial / LPF: NONE SEEN
Urobilinogen, UA: 0.2 mg/dL (ref 0.0–1.0)
pH: 5.5 (ref 5.0–8.0)

## 2014-09-11 LAB — URINE CULTURE: Colony Count: >=100000

## 2014-09-12 LAB — CYTOLOGY - PAP

## 2014-09-14 ENCOUNTER — Other Ambulatory Visit: Payer: Self-pay | Admitting: Women's Health

## 2014-09-14 MED ORDER — CIPROFLOXACIN HCL 250 MG PO TABS: 250.0000 mg | ORAL_TABLET | Freq: Two times a day (BID) | ORAL | Status: DC

## 2014-09-26 ENCOUNTER — Encounter: Payer: Self-pay | Admitting: Gynecology

## 2014-10-03 ENCOUNTER — Other Ambulatory Visit: Payer: Self-pay | Admitting: Nurse Practitioner

## 2015-01-17 ENCOUNTER — Telehealth: Payer: Self-pay | Admitting: Oncology

## 2015-01-17 ENCOUNTER — Other Ambulatory Visit (HOSPITAL_BASED_OUTPATIENT_CLINIC_OR_DEPARTMENT_OTHER): Payer: 59

## 2015-01-17 ENCOUNTER — Ambulatory Visit (HOSPITAL_BASED_OUTPATIENT_CLINIC_OR_DEPARTMENT_OTHER): Payer: 59 | Admitting: Oncology

## 2015-01-17 VITALS — BP 143/86 | HR 61 | Temp 98.3°F | Resp 18 | Ht 71.0 in | Wt 220.2 lb

## 2015-01-17 DIAGNOSIS — Z86711 Personal history of pulmonary embolism: Secondary | ICD-10-CM | POA: Diagnosis not present

## 2015-01-17 DIAGNOSIS — Z85048 Personal history of other malignant neoplasm of rectum, rectosigmoid junction, and anus: Secondary | ICD-10-CM

## 2015-01-17 DIAGNOSIS — C20 Malignant neoplasm of rectum: Secondary | ICD-10-CM

## 2015-01-17 LAB — CEA: CEA: <0.5 ng/mL (ref 0.0–5.0)

## 2015-01-17 NOTE — Progress Notes (Signed)
  [name] OFFICE PROGRESS NOTE   Diagnosis: Rectal cancer  INTERVAL HISTORY:   [name] returns as scheduled. She reports increased rectal urgency/incontinence and she discontinued the pelvic exercises. The pelvic exercises have helped in the past. She is lifting weights and noticed "swelling "in the axillary area bilaterally. Good appetite and energy level.  Objective:  Vital signs in last 24 hours:  Blood pressure 143/86, pulse 61, temperature 98.3 F (36.8 C), temperature source Oral, resp. rate 18, height 5\' 11"  (1.803 m), weight 220 lb 3.2 oz (99.882 kg), SpO2 100 %.    HEENT: Neck without mass Lymphatics: No cervical, supra-clavicular, axillary, or inguinal nodes. The axillae are symmetric and without apparent mass or edema. Resp: Lungs clear bilaterally Cardio: Regular rate and rhythm GI: No hepatomegaly, nontender, no mass Vascular: No leg edema   Lab Results:  Lab Results  Component Value Date   WBC 3.3* 12/17/2013   HGB 13.3 12/17/2013   HCT 40.6 12/17/2013   MCV 90.3 12/17/2013   PLT 219 12/17/2013   NEUTROABS 1.6 12/17/2013      Lab Results  Component Value Date   CEA <0.5 07/19/2014    Imaging:  No results found.  Medications: I have reviewed the patient's current medications.  Assessment/Plan: 1. Rectal cancer, stage III (T3 N1b), status post a low anterior resection 01/31/2011, status post radiation with continuous infusion 5-FU 03/19/2011 through 04/30/2011. Status post FOLFOX beginning 07/03/2011 for 8 cycles. Surveillance colonoscopy 02/19/2012-negative for recurrent cancer or polyps. Surveillance colonoscopy 04/15/2014-entire examined colon normal; terminal ileum appeared normal. Next colonoscopy recommended at a 5 year interval.  Surveillance CT scans negative 12/17/2013 2. Pulmonary embolism March 2012, status post IVC filter placement prior to cancer surgery in April 2012, followed by Dr. Chase Caller. Risk factors for  pulmonary embolism felt to be related to obesity, oral contraceptive use, rectal cancer, and sedentary job. Now maintained off of anticoagulation  3. Removal of Port-A-Cath March 2014 4. Urinary/fecal incontinence. Initially improved with the pelvic physical therapy program.   Disposition:  [name] remains in clinical remission from rectal cancer. She will resume pelvic exercises for the rectal urgency/incontinence. If this is not helpful she will contact us and we will refer her to physical therapy. She will return for an office visit in 6 months.  [name] Coder, MD  01/17/2015  4:04 PM

## 2015-01-17 NOTE — Telephone Encounter (Signed)
Gave avs & calendar for October. °

## 2015-01-18 ENCOUNTER — Telehealth: Payer: Self-pay | Admitting: *Deleted

## 2015-01-18 NOTE — Telephone Encounter (Signed)
-----   Message from Ladell Pier, MD sent at 01/18/2015  3:00 PM EDT ----- Please call patient, [name] is normal

## 2015-01-18 NOTE — Telephone Encounter (Signed)
Per Dr. Sherrill; notified pt that cea is normal.  Pt verbalized understanding and expressed appreciation for call. 

## 2015-06-07 ENCOUNTER — Encounter: Payer: Self-pay | Admitting: Adult Health

## 2015-06-07 ENCOUNTER — Ambulatory Visit (HOSPITAL_COMMUNITY)
Admission: RE | Admit: 2015-06-07 | Discharge: 2015-06-07 | Disposition: A | Discharge status: Home / Self Care | Payer: 59 | Source: Ambulatory Visit | Attending: Adult Health | Admitting: Adult Health

## 2015-06-07 ENCOUNTER — Ambulatory Visit (INDEPENDENT_AMBULATORY_CARE_PROVIDER_SITE_OTHER): Payer: 59 | Admitting: Adult Health

## 2015-06-07 ENCOUNTER — Telehealth: Payer: Self-pay | Admitting: Internal Medicine

## 2015-06-07 VITALS — BP 132/82 | HR 68 | Temp 97.7°F | Ht 71.0 in | Wt 217.0 lb

## 2015-06-07 DIAGNOSIS — I2699 Other pulmonary embolism without acute cor pulmonale: Secondary | ICD-10-CM | POA: Diagnosis not present

## 2015-06-07 DIAGNOSIS — Z86711 Personal history of pulmonary embolism: Secondary | ICD-10-CM | POA: Diagnosis not present

## 2015-06-07 DIAGNOSIS — M79661 Pain in right lower leg: Secondary | ICD-10-CM

## 2015-06-07 DIAGNOSIS — Z85038 Personal history of other malignant neoplasm of large intestine: Secondary | ICD-10-CM | POA: Insufficient documentation

## 2015-06-07 DIAGNOSIS — M79662 Pain in left lower leg: Secondary | ICD-10-CM | POA: Insufficient documentation

## 2015-06-07 DIAGNOSIS — E119 Type 2 diabetes mellitus without complications: Secondary | ICD-10-CM | POA: Insufficient documentation

## 2015-06-07 DIAGNOSIS — E785 Hyperlipidemia, unspecified: Secondary | ICD-10-CM | POA: Diagnosis not present

## 2015-06-07 NOTE — Progress Notes (Signed)
Subjective:    Patient ID: [name], female    DOB: [birth year], 49 y.o.   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   06/07/2015 Acute OV : Calf pain  Pt present for an acute office visit.  She complains of  her calfs are aching since 06/05/15. More on left  Traveled on vacation to [place] and did a lot of walking in flip flops.  Long car rided there and back . Esp on way back with traffic issues .   Pt denies any SOB, wheezing, chest congestion/tightness, cough.  Last seen in 2014. Has a hx of PE  . Pulmonary embolism March 2012, status post IVC filter placement prior to cancer surgery in April 2012,  . Felt to be provoked PE with multiple Risk factors for pulmonary embolism felt to be related to obesity, oral contraceptive use, rectal cancer, and sedentary job.  She was treated with course of coumadin and has been off since 2014.  Says her calves feel tight and ache . Sore to touch on the right.  No chest pain, orthopnea, edema , hemoptysis , syncope.  IVC was unable to be removed in past   Follow of rectal /colon cancer with no evidence of dz reoccurence.    Review of Systems Constitutional:   No  weight loss, night sweats,  Fevers, chills, fatigue, or  lassitude.  HEENT:   No headaches,  Difficulty swallowing,  Tooth/dental problems, or  Sore throat,                No sneezing, itching, ear ache, nasal congestion, post nasal drip,   CV:  No chest pain,  Orthopnea, PND, swelling in lower extremities, anasarca, dizziness, palpitations, syncope. +calf pain   GI  No heartburn, indigestion, abdominal pain, nausea, vomiting, diarrhea, change in bowel habits, loss of appetite, bloody stools.   Resp: No shortness of breath with exertion or at rest.  No excess mucus, no productive cough,  No non-productive cough,  No coughing up  of blood.  No change in color of mucus.  No wheezing.  No chest wall deformity  Skin: no rash or lesions.  GU: no dysuria, change in color of urine, no urgency or frequency.  No flank pain, no hematuria   MS:  No joint pain or swelling.  No decreased range of motion.  No back pain.  Psych:  No change in mood or affect. No depression or anxiety.  No memory loss.    '    Objective:   Physical Exam  GEN: A/Ox3; pleasant , NAD, well nourished   HEENT:  [place]/AT,  EACs-clear, TMs-wnl, NOSE-clear, THROAT-clear, no lesions, no postnasal drip or exudate noted.   NECK:  Supple w/ fair ROM; no JVD; normal carotid impulses w/o bruits; no thyromegaly or nodules palpated; no lymphadenopathy.  RESP  Clear  P & A; w/o, wheezes/ rales/ or rhonchi.no accessory muscle use, no dullness to percussion  CARD:  RRR, no m/r/g  , no peripheral edema, pulses intact,  Bilateral calf pain , L>R , does appear left calf might be slightly bigger, neg homans sign bilaterally  Pulses intact bilaterally and warm to touch   GI:   Soft & nt; nml bowel sounds; no organomegaly or masses detected.  Musco: Warm bil, no deformities  or joint swelling noted.   Neuro: alert, no focal deficits noted.    Skin: Warm, no lesions or rashes        Assessment & Plan:

## 2015-06-07 NOTE — Patient Instructions (Signed)
Set  Up for Venous Doppler of lower legs I will call with results.  Please contact office for sooner follow up if symptoms do not improve or worsen or seek emergency care

## 2015-06-07 NOTE — Telephone Encounter (Signed)
Called pt and acute visit scheduled with TP this AM. Nothing further needed

## 2015-06-08 DIAGNOSIS — M79662 Pain in left lower leg: Secondary | ICD-10-CM

## 2015-06-08 DIAGNOSIS — M79661 Pain in right lower leg: Secondary | ICD-10-CM | POA: Insufficient documentation

## 2015-06-08 NOTE — Assessment & Plan Note (Signed)
Bilateral calf pain L>R with long car ride and hx of PE  Set pt up for ven doppler   Plan  ven doppler today stat .  Please contact office for sooner follow up if symptoms do not improve or worsen or seek emergency care    Late add: venous doppler neg, pt was advised at ov if neg to take tylenol As needed  And warm heat to legs  If not improving will need to follow up with PCP

## 2015-06-08 NOTE — Assessment & Plan Note (Signed)
Hx of PE with 2 years of tx -felt to be provoked  calf pain w/ long car ride ,  Venous doppler neg for DVT  No sx of PE on hx or exam

## 2015-07-24 ENCOUNTER — Ambulatory Visit (HOSPITAL_BASED_OUTPATIENT_CLINIC_OR_DEPARTMENT_OTHER): Payer: 59 | Admitting: Oncology

## 2015-07-24 ENCOUNTER — Telehealth: Payer: Self-pay | Admitting: Oncology

## 2015-07-24 ENCOUNTER — Other Ambulatory Visit (HOSPITAL_BASED_OUTPATIENT_CLINIC_OR_DEPARTMENT_OTHER): Payer: 59

## 2015-07-24 VITALS — BP 147/77 | HR 68 | Temp 98.4°F | Resp 20 | Ht 71.0 in | Wt 217.3 lb

## 2015-07-24 DIAGNOSIS — C20 Malignant neoplasm of rectum: Secondary | ICD-10-CM

## 2015-07-24 DIAGNOSIS — R32 Unspecified urinary incontinence: Secondary | ICD-10-CM | POA: Diagnosis not present

## 2015-07-24 DIAGNOSIS — R159 Full incontinence of feces: Secondary | ICD-10-CM | POA: Diagnosis not present

## 2015-07-24 DIAGNOSIS — Z85048 Personal history of other malignant neoplasm of rectum, rectosigmoid junction, and anus: Secondary | ICD-10-CM | POA: Diagnosis not present

## 2015-07-24 DIAGNOSIS — Z86711 Personal history of pulmonary embolism: Secondary | ICD-10-CM | POA: Diagnosis not present

## 2015-07-24 NOTE — Progress Notes (Signed)
  [facility] OFFICE PROGRESS NOTE   Diagnosis: Rectal cancer  INTERVAL HISTORY:   [name] returns as scheduled. She reports continued frequent bowel movements and fecal incontinence. She is not performing the pelvic physical therapy exercises. She is followed by Dr. Bubba Camp for diabetes.  Objective:  Vital signs in last 24 hours:  Blood pressure 147/77, pulse 68, temperature 98.4 F (36.9 C), temperature source Oral, resp. rate 20, height 5\' 11"  (1.803 m), weight 217 lb 4.8 oz (98.567 kg), SpO2 97 %.    HEENT: Neck without mass Lymphatics: No cervical, supraclavicular, axillary, or inguinal nodes Resp: Lungs clear bilaterally Cardio: Regular rate and rhythm GI: No hepatosplenomegaly, no mass Vascular: No leg edema   Lab Results:  Lab Results  Component Value Date   WBC 3.3* 12/17/2013   HGB 13.3 12/17/2013   HCT 40.6 12/17/2013   MCV 90.3 12/17/2013   PLT 219 12/17/2013   NEUTROABS 1.6 12/17/2013      Lab Results  Component Value Date   CEA <0.5 01/17/2015   Medications: I have reviewed the patient's current medications.  Assessment/Plan: 1. Rectal cancer, stage III (T3 N1b), status post a low anterior resection 01/31/2011, status post radiation with continuous infusion 5-FU 03/19/2011 through 04/30/2011. Status post FOLFOX beginning 07/03/2011 for 8 cycles. Surveillance colonoscopy 02/19/2012-negative for recurrent cancer or polyps. Surveillance colonoscopy 04/15/2014-entire examined colon normal; terminal ileum appeared normal. Next colonoscopy recommended at a 5 year interval.  Surveillance CT scans negative 12/17/2013 2. Pulmonary embolism March 2012, status post IVC filter placement prior to cancer surgery in April 2012, followed by Dr. Chase Caller. Risk factors for pulmonary embolism felt to be related to obesity, oral contraceptive use, rectal cancer, and sedentary job. Now maintained off of anticoagulation  3. Removal of Port-A-Cath March  2014 4. Urinary/fecal incontinence. Initially improved with the pelvic physical therapy program.   Disposition:  [name] remains in clinical remission from rectal cancer. I encouraged her to resume pelvic physical therapy exercises. We will follow-up on the CEA from today. She will return for an office visit and CEA in 6 months.  Betsy Coder, MD  07/24/2015  3:52 PM

## 2015-07-24 NOTE — Telephone Encounter (Signed)
per pof to sch pt appt-gave pt copy of avs °

## 2015-07-25 LAB — CEA: CEA: <0.5 ng/mL (ref 0.0–5.0)

## 2015-07-26 ENCOUNTER — Telehealth: Payer: Self-pay | Admitting: *Deleted

## 2015-07-26 NOTE — Telephone Encounter (Signed)
Per Dr. Sherrill; notified pt that cea is normal.  Pt verbalized understanding and expressed appreciation for call. 

## 2015-07-26 NOTE — Telephone Encounter (Signed)
-----   Message from Ladell Pier, MD sent at 07/25/2015  1:37 PM EDT ----- Please call patient, [name] is normal

## 2015-08-28 ENCOUNTER — Ambulatory Visit (INDEPENDENT_AMBULATORY_CARE_PROVIDER_SITE_OTHER): Payer: 59 | Admitting: Cardiology

## 2015-08-28 ENCOUNTER — Encounter: Payer: Self-pay | Admitting: Cardiology

## 2015-08-28 VITALS — BP 126/82 | HR 68 | Ht 71.0 in | Wt 217.0 lb

## 2015-08-28 DIAGNOSIS — Z8249 Family history of ischemic heart disease and other diseases of the circulatory system: Secondary | ICD-10-CM | POA: Diagnosis not present

## 2015-08-28 DIAGNOSIS — E785 Hyperlipidemia, unspecified: Secondary | ICD-10-CM | POA: Diagnosis not present

## 2015-08-28 DIAGNOSIS — R072 Precordial pain: Secondary | ICD-10-CM

## 2015-08-28 MED ORDER — ATORVASTATIN CALCIUM 20 MG PO TABS: 20.0000 mg | ORAL_TABLET | Freq: Every day | ORAL | Status: DC

## 2015-08-28 NOTE — Progress Notes (Signed)
Patient ID: [name], female   DOB: 1966-06-16, 49 y.o.   MRN: [id number]    Patient Name: [name] Date of Encounter: 08/28/2015  Primary Care Provider:  Salena Saner., MD Primary Cardiologist: Dorothy Spark (Dr. Verl Blalock)  Problem List   Past Medical History  Diagnosis Date  . Hyperlipidemia   . Pulmonary embolism ([place])   . STD (sexually transmitted disease) 1991    POS GC-TREATED  . Diabetes mellitus type 2, diet-controlled ([place])   . Abnormal FSH level 02/2012    value 53  . Cancer of colon with rectum ([place]) 02-09-2011  . Headache(784.0)     experiences migraine every couple of weeks. takes only excedrin  . 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 None    . Rectal tumor by proctotomy excision    . Portacath placement  02/21/11    tip in mid svc-rosenbower  . Cesarean section  02-09-04  . Colon surgery    . Insertion of vena cava filter Right 2011-02-09  . Port-a-cath removal Right 12/22/2012    Procedure: REMOVAL PORT-A-CATH;  Surgeon: Odis Hollingshead, MD;  Location: [facility];  Service: General;  Laterality: Right;    Allergies  Allergies  Allergen Reactions  . Penicillins Hives   HPI  A very pleasant 49 year old female who is coming for a follow up for family h/o hypertrophic cardiomyopathy. She saw Dr Verl Blalock in 2005/02/08 after her father died of SCD and autopsy showed HCM. Both her and her brother had ECG and echocardiogram at that time and it was not suggestive for HCM.  She also has h/o DM type 2, B/L pulmonary embolism in 09-Feb-2011, hyperlipidemia on statins, hypothyroidism and rectal ca in 2011-02-09, s/p resection. PE was at the time of colon cancer.  She has noticed lately that she gets SOB with moderate activity like attending zumba class and feels overall more fatigued.   1 year follow up - normal echo and  normal stress echo a year ago. She states she gets occasional chest pains when her glucose is high. The dose of glimepiride was recently increased. She will also get palpitaions at that time, but no syncope. No exertional chest pain, she goes to Zumba class and tolerates it well. Her lipids were checked recently and were elevated. No claudications, orthopnea, PND or LE edema.  Home Medications  Prior to Admission medications   Medication Sig Start Date End Date Taking? Authorizing Provider  aspirin 81 MG tablet Take 81 mg by mouth daily.   Yes Historical Provider, MD  aspirin-acetaminophen-caffeine (EXCEDRIN MIGRAINE) [id number] MG per tablet Take 2 tablets by mouth every 6 (six) hours as needed. For migraines   Yes Historical Provider, MD  cholecalciferol (VITAMIN D) 400 UNITS TABS tablet Take 400 Units by mouth.   Yes Historical Provider, MD  CINNAMON PO Take by mouth. For blood sugar   Yes Historical Provider, MD  cyanocobalamin 500 MCG tablet Take 500 mcg by mouth daily.   Yes Historical Provider, MD  levothyroxine (SYNTHROID, LEVOTHROID) 50 MCG tablet Take 50 mcg by mouth daily before breakfast.  06/06/13  Yes Historical Provider, MD  pyridOXINE (VITAMIN B-6) 100 MG tablet Take 100 mg by mouth 2 (two) times daily.   Yes Historical Provider, MD  simvastatin (ZOCOR) 10 MG tablet Take 10 mg by  mouth at bedtime.   Yes Historical Provider, MD    Family History  Family History  Problem Relation Age of Onset  . Heart disease Father   . Lung cancer Mother   . Brain cancer Mother   . Diabetes Mother   . Hypertension Mother   . Cervical cancer Paternal Grandmother   . Diabetes Brother   . Kidney disease Brother     Social History  Social History   Social History  . Marital Status: Married    Spouse Name: N/A  . Number of Children: 1  . Years of Education: N/A   Occupational History  . claims adjuster    Social History Main Topics  . Smoking status: Never Smoker   . Smokeless  tobacco: Never Used  . Alcohol Use: No     Comment: rare- twice a year  . Drug Use: No  . Sexual Activity: No     Comment: no periods due to radiation   Other Topics Concern  . Not on file   Social History Narrative     Review of Systems, as per HPI, otherwise negative General:  No chills, fever, night sweats or weight changes.  Cardiovascular:  No chest pain, dyspnea on exertion, edema, orthopnea, palpitations, paroxysmal nocturnal dyspnea. Dermatological: No rash, lesions/masses Respiratory: No cough, dyspnea Urologic: No hematuria, dysuria Abdominal:   No nausea, vomiting, diarrhea, bright red blood per rectum, melena, or hematemesis Neurologic:  No visual changes, wkns, changes in mental status. All other systems reviewed and are otherwise negative except as noted above.  Physical Exam  Blood pressure 126/82, pulse 68, height 5\' 11"  (1.803 m), weight 217 lb (98.431 kg).  General: Pleasant, NAD Psych: Normal affect. Neuro: Alert and oriented X 3. Moves all extremities spontaneously. HEENT: Normal  Neck: Supple without bruits or JVD. Lungs:  Resp regular and unlabored, CTA. Heart: RRR no s3, s4, or murmurs. Abdomen: Soft, non-tender, non-distended, BS + x 4.  Extremities: No clubbing, cyanosis or edema. DP/PT/Radials 2+ and equal bilaterally.  Labs:  No results for input(s): CKTOTAL, CKMB, TROPONINI in the last 72 hours. Lab Results  Component Value Date   WBC 3.3* 12/17/2013   HGB 13.3 12/17/2013   HCT 40.6 12/17/2013   MCV 90.3 12/17/2013   PLT 219 12/17/2013    Lab Results  Component Value Date   DDIMER <0.27 08/07/2012   Invalid input(s): POCBNP    Component Value Date/Time   NA 139 08/10/2014 1053   NA 142 12/17/2013 1538   NA 143 06/01/2012 1604   K 3.9 08/10/2014 1053   K 4.1 12/17/2013 1538   K 4.3 06/01/2012 1604   CL 107 08/10/2014 1053   CL 106 12/07/2012 0838   CL 100 06/01/2012 1604   CO2 21 08/10/2014 1053   CO2 28 12/17/2013 1538   CO2  30 06/01/2012 1604   GLUCOSE 117* 08/10/2014 1053   GLUCOSE 123 12/17/2013 1538   GLUCOSE 119* 12/07/2012 0838   GLUCOSE 100 06/01/2012 1604   BUN 18 08/10/2014 1053   BUN 14.1 12/17/2013 1538   BUN 14 06/01/2012 1604   CREATININE 1.0 08/10/2014 1053   CREATININE 0.8 12/17/2013 1538   CREATININE 1.0 06/01/2012 1604   CALCIUM 9.4 08/10/2014 1053   CALCIUM 9.7 12/17/2013 1538   CALCIUM 9.8 06/01/2012 1604   PROT 8.0 08/10/2014 1053   PROT 7.7 12/17/2013 1538   PROT 7.7 06/01/2012 1604   ALBUMIN 3.7 08/10/2014 1053   ALBUMIN 4.0 12/17/2013 1538  ALBUMIN 3.6 06/01/2012 1604   AST 31 08/10/2014 1053   AST 15 12/17/2013 1538   AST 21 06/01/2012 1604   ALT 30 08/10/2014 1053   ALT 19 12/17/2013 1538   ALT 30 06/01/2012 1604   ALKPHOS 115 08/10/2014 1053   ALKPHOS 147 12/17/2013 1538   ALKPHOS 112* 06/01/2012 1604   BILITOT 0.8 08/10/2014 1053   BILITOT 0.30 12/17/2013 1538   BILITOT 0.60 06/01/2012 1604   GFRNONAA 88* 12/21/2012 1530   GFRAA >90 12/21/2012 1530   Lab Results  Component Value Date   CHOL 175 08/10/2014   Accessory Clinical Findings  Echocardiogram - 08/10/2014 Study Conclusions  - Left ventricle: The cavity size was normal. Systolic function was normal. The estimated ejection fraction was in the range of 60% to 65%. Wall motion was normal; there were no regional wall motion abnormalities. There was an increased relative contribution of atrial contraction to ventricular filling. Doppler parameters are consistent with abnormal left ventricular relaxation (grade 1 diastolic dysfunction). - Mitral valve: There was mild regurgitation. - Tricuspid valve: There was trivial regurgitation.  ECG - normal sinus rhythm with sinus arrhythmia, 67 beats per minute  Exercise treadmill stress test: 08/10/2014 Comments: Good exercise capacity. No chest pain. Normal BP response to exercise. There was increased motion artifact. There was non-specific  up-sloping ST depression.  No ST changes to suggest ischemia.  No exercise induced ventricular arrhythmias.     Assessment & Plan  49 year old female   1. Chest pain - normal echocardiogram, normal stress echocardiogram with good exertional capacity and no symptoms. The patient chest pain is actually nonexertional and most like postprandial. Differential diagnosis in a diabetic patient is broad and include decreased GI motility, is aphasia spasm or gastric reflux disease. The patient is advised to follow with GI doctor. She has one since she has history of colon cancer.  2. Cardiovascular preventive care - considering patient has risk factors that include diabetes and obesity we strongly advised to continue aspirin and statin. Her lipids are elevated, I will switch simvastatin to atorvastatin 20 mg po daily and check CMP and lipids in 2 months.   3. BP - controlled.  4. FH of HCM - ECG not suggestive, echocardiogram normal. Since she hasn't developed phenotype of HCM till now the chances overload that she will do that in the future.  4. Hyperlipidemia - As above.   Follow up in one year.   Dorothy Spark, MD, [facility] 08/28/2015, 4:12 PM

## 2015-08-28 NOTE — Patient Instructions (Addendum)
  Medication Instructions:   STOP SIMVASTATIN NOW  START ATORVASTATIN 20 MG ONCE DAILY    Labwork:  IN 2 MONTHS ---WE WILL CHECK A CMET AND LIPIDS----PLEASE COME FASTING TO THIS LAB APPOINTMENT     Follow-Up:  Your physician wants you to follow-up in: [place] will receive a reminder letter in the mail two months in advance. If you don't receive a letter, please call our office to schedule the follow-up appointment.        If you need a refill on your cardiac medications before your next appointment, please call your pharmacy.

## 2015-08-28 NOTE — Addendum Note (Signed)
Addended by: Nuala Alpha on: 08/28/2015 04:49 PM   Modules accepted: Orders, Medications

## 2015-09-12 ENCOUNTER — Ambulatory Visit (INDEPENDENT_AMBULATORY_CARE_PROVIDER_SITE_OTHER): Payer: 59 | Admitting: Women's Health

## 2015-09-12 ENCOUNTER — Other Ambulatory Visit (HOSPITAL_COMMUNITY)
Admission: RE | Admit: 2015-09-12 | Discharge: 2015-09-12 | Disposition: A | Discharge status: Home / Self Care | Payer: 59 | Source: Ambulatory Visit | Attending: Women's Health | Admitting: Women's Health

## 2015-09-12 ENCOUNTER — Encounter: Payer: Self-pay | Admitting: Cardiology

## 2015-09-12 ENCOUNTER — Encounter: Payer: Self-pay | Admitting: Women's Health

## 2015-09-12 VITALS — BP 134/80 | Ht 71.0 in | Wt 216.0 lb

## 2015-09-12 DIAGNOSIS — R87810 Cervical high risk human papillomavirus (HPV) DNA test positive: Secondary | ICD-10-CM | POA: Insufficient documentation

## 2015-09-12 DIAGNOSIS — Z01419 Encounter for gynecological examination (general) (routine) without abnormal findings: Secondary | ICD-10-CM | POA: Diagnosis not present

## 2015-09-12 DIAGNOSIS — R140 Abdominal distension (gaseous): Secondary | ICD-10-CM | POA: Diagnosis not present

## 2015-09-12 DIAGNOSIS — Z01411 Encounter for gynecological examination (general) (routine) with abnormal findings: Secondary | ICD-10-CM | POA: Diagnosis present

## 2015-09-12 DIAGNOSIS — Z1151 Encounter for screening for human papillomavirus (HPV): Secondary | ICD-10-CM | POA: Insufficient documentation

## 2015-09-12 NOTE — Progress Notes (Signed)
[name] 12/17/1965 [id number]    History:    Presents for annual exam.  Postmenopausal with no bleeding since 2013. 2012 rectal cancer diagnosis treated by surgery, chemotherapy and radiation, cycle stopped after treatment started. 2015 negative colonoscopy. Continues to have some bowel incontinence, not daily. Has had physical therapy in the past which helped. Normal mammogram history. Pulmonary embolism 2012. Diabetes and hypothyroidism managed by Dr. Chalmers Cater. 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  Past medical history, past surgical history, family history and social history were all reviewed and documented in the EPIC chart. Works from home for [employer]. 48 year old son type 1 diabetes noncompliant. 1 year old an 78 year old okay. Husband - hypertension ED not sexually active. Mother died of lung cancer.  ROS:  A ROS was performed and pertinent positives and negatives are included.  Exam:  Filed Vitals:   09/12/15 1541  BP: 134/80    General appearance:  Normal Thyroid:  Symmetrical, normal in size, without palpable masses or nodularity. Respiratory  Auscultation:  Clear without wheezing or rhonchi Cardiovascular  Auscultation:  Regular rate, without rubs, murmurs or gallops  Edema/varicosities:  Not grossly evident Abdominal  Soft,nontender, without masses, guarding or rebound.  Liver/spleen:  No organomegaly noted  Hernia:  None appreciated  Skin  Inspection:  Grossly normal   Breasts: Examined lying and sitting.     Right: Without masses, retractions, discharge or axillary adenopathy.     Left: Without masses, retractions, discharge or axillary adenopathy. Gentitourinary   Inguinal/mons:  Normal without inguinal adenopathy  External genitalia:  Normal  BUS/Urethra/Skene's glands:  Normal  Vagina:  Normal  Cervix:  Normal  Uterus:   normal in size, shape and contour.  Midline and mobile  Adnexa/parametria:     Rt: Without  masses or tenderness.   Lt: Without masses or tenderness.  Anus and perineum: Normal  Digital rectal exam: Normal sphincter tone without palpated masses or tenderness  Assessment/Plan:  49 y.o. MBF G3 P3 for annual exam with complaint of abdominal bloating intermittent and occasional bowel incontinence.  Postmenopausal/no bleeding with bloating 2012 rectal cancer treated with surgery, chemotherapy and radiation-normal colonoscopy 2015 2015 Pap normal, positive HR HPV,  -16, 18 and 45 Diabetes, hypothyroidism, hypercholesterolemia-Dr. Chalmers Cater manages labs and meds 2012 pulmonary embolism on Coumadin until 2014  Plan: Pap with HR HPV typing. Schedule ultrasound. SBE's, continue annual screening mammogram overdue instructed to schedule. Reviewed importance of regular exercise, leisure activities, calcium rich diet, vitamin D 1000 daily encouraged. DEXA closer to 50. Return to physical therapy for bowel incontinence.    [place], 5:18 PM 09/12/2015

## 2015-09-12 NOTE — Patient Instructions (Signed)

## 2015-09-13 LAB — URINALYSIS W MICROSCOPIC + REFLEX CULTURE
Bilirubin Urine: NEGATIVE
CRYSTALS: NONE SEEN [HPF]
Casts: NONE SEEN [LPF]
GLUCOSE, UA: NEGATIVE
KETONES UR: NEGATIVE
LEUKOCYTES UA: NEGATIVE
Nitrite: NEGATIVE
PROTEIN: NEGATIVE
Specific Gravity, Urine: 1.024 (ref 1.001–1.035)
Yeast: NONE SEEN [HPF]
pH: 5.5 (ref 5.0–8.0)

## 2015-09-15 LAB — URINE CULTURE

## 2015-09-18 LAB — CYTOLOGY - PAP

## 2015-09-21 ENCOUNTER — Other Ambulatory Visit: Payer: Self-pay | Admitting: Women's Health

## 2015-09-21 MED ORDER — CIPROFLOXACIN HCL 500 MG PO TABS: 500.0000 mg | ORAL_TABLET | Freq: Two times a day (BID) | ORAL | Status: DC

## 2015-10-12 ENCOUNTER — Ambulatory Visit: Payer: 59 | Admitting: Gynecology

## 2015-10-25 ENCOUNTER — Other Ambulatory Visit: Payer: 59

## 2015-10-25 ENCOUNTER — Ambulatory Visit: Payer: 59 | Admitting: Women's Health

## 2015-10-27 ENCOUNTER — Other Ambulatory Visit (INDEPENDENT_AMBULATORY_CARE_PROVIDER_SITE_OTHER): Payer: 59 | Admitting: *Deleted

## 2015-10-27 DIAGNOSIS — E785 Hyperlipidemia, unspecified: Secondary | ICD-10-CM | POA: Diagnosis not present

## 2015-10-27 DIAGNOSIS — R072 Precordial pain: Secondary | ICD-10-CM

## 2015-10-27 DIAGNOSIS — Z8249 Family history of ischemic heart disease and other diseases of the circulatory system: Secondary | ICD-10-CM | POA: Diagnosis not present

## 2015-10-27 LAB — COMPREHENSIVE METABOLIC PANEL
ALT: 25 U/L (ref 6–29)
AST: 18 U/L (ref 10–35)
Albumin: 4.1 g/dL (ref 3.6–5.1)
Alkaline Phosphatase: 98 U/L (ref 33–115)
BUN: 15 mg/dL (ref 7–25)
CO2: 27 mmol/L (ref 20–31)
Calcium: 9.3 mg/dL (ref 8.6–10.2)
Chloride: 104 mmol/L (ref 98–110)
Creat: 0.96 mg/dL (ref 0.50–1.10)
Glucose, Bld: 202 mg/dL — ABNORMAL HIGH (ref 65–99)
Potassium: 4.4 mmol/L (ref 3.5–5.3)
Sodium: 138 mmol/L (ref 135–146)
Total Bilirubin: 0.4 mg/dL (ref 0.2–1.2)
Total Protein: 7.1 g/dL (ref 6.1–8.1)

## 2015-10-27 LAB — LIPID PANEL
Cholesterol: 167 mg/dL (ref 125–200)
HDL: 43 mg/dL — ABNORMAL LOW (ref >=46)
LDL Cholesterol: 106 mg/dL (ref <130)
Total CHOL/HDL Ratio: 3.9 Ratio (ref <=5.0)
Triglycerides: 88 mg/dL (ref <150)
VLDL: 18 mg/dL (ref <30)

## 2015-10-27 NOTE — Addendum Note (Signed)
Addended by: Eulis Foster on: 10/27/2015 07:58 AM   Modules accepted: Orders

## 2015-10-30 ENCOUNTER — Other Ambulatory Visit: Payer: 59

## 2015-10-31 ENCOUNTER — Ambulatory Visit (INDEPENDENT_AMBULATORY_CARE_PROVIDER_SITE_OTHER): Payer: 59 | Admitting: Gynecology

## 2015-10-31 ENCOUNTER — Encounter: Payer: Self-pay | Admitting: Gynecology

## 2015-10-31 VITALS — BP 120/76

## 2015-10-31 DIAGNOSIS — R87810 Cervical high risk human papillomavirus (HPV) DNA test positive: Secondary | ICD-10-CM

## 2015-10-31 NOTE — Progress Notes (Signed)
[name] 06-09-1966 [id number]        49 y.o.  [id number] Presents for colposcopy. Patient had Pap smear last year with normal cytology but positive high-risk HPV negative subtype 16, 18/45. Repeat Pap smear this year again showed normal cytology with positive high-risk HPV, negative subtype 16, 18/45. She does relate having an abnormal Pap smear a number of years ago which predates our EMR and initially did not think she was treated but on questioning she thinks she may have been treated with cryosurgery.  Past medical history,surgical history, problem list, medications, allergies, family history and social history were all reviewed and documented in the EPIC chart.  Directed ROS with pertinent positives and negatives documented in the history of present illness/assessment and plan.  Exam: [name] assistant Filed Vitals:   10/31/15 1506  BP: 120/76   General appearance:  Normal Pelvic external BUS vagina with mild atrophic changes. Cervix atrophic. Uterus normal size midline mobile nontender. Adnexa without masses or tenderness  Colposcopy after acetic acid cleanse is adequate and normal. Very symmetrical external os suggests possible cryo-in the past. ECC performed. Patient tolerated well.  Assessment/Plan:  50 y.o. [id number] with her last 2 Pap smears showing positive high-risk HPV, negative subtype 16, 18/45 and normal cytologies. Colposcopy is adequate normal. ECC performed. If negative plan expectant management with follow up Pap smear/HPV in 1 year. If otherwise then will triage based on results.    [name] Auerbach MD, 3:27 PM 10/31/2015

## 2015-10-31 NOTE — Patient Instructions (Signed)
Office will call you with biopsy results 

## 2015-10-31 NOTE — Addendum Note (Signed)
Addended by: Nelva Nay on: 10/31/2015 04:03 PM   Modules accepted: Orders

## 2015-11-09 ENCOUNTER — Encounter: Payer: Self-pay | Admitting: Women's Health

## 2015-11-09 ENCOUNTER — Ambulatory Visit (INDEPENDENT_AMBULATORY_CARE_PROVIDER_SITE_OTHER): Payer: 59 | Admitting: Women's Health

## 2015-11-09 ENCOUNTER — Other Ambulatory Visit: Payer: Self-pay | Admitting: Women's Health

## 2015-11-09 ENCOUNTER — Ambulatory Visit (INDEPENDENT_AMBULATORY_CARE_PROVIDER_SITE_OTHER): Payer: 59

## 2015-11-09 VITALS — BP 120/80 | Ht 71.0 in | Wt 216.0 lb

## 2015-11-09 DIAGNOSIS — R140 Abdominal distension (gaseous): Secondary | ICD-10-CM | POA: Diagnosis not present

## 2015-11-09 DIAGNOSIS — N83319 Acquired atrophy of ovary, unspecified side: Secondary | ICD-10-CM

## 2015-11-09 DIAGNOSIS — Z85048 Personal history of other malignant neoplasm of rectum, rectosigmoid junction, and anus: Secondary | ICD-10-CM

## 2015-11-09 NOTE — Progress Notes (Signed)
Patient ID: [name], female   DOB: [birth year], 50 y.o.   MRN: [id number] Presents for ultrasound related to pelvic and abdominal bloating. 2012 rectal cancer - surgery, chemotherapy, radiation and has been amenorrheic since. 2012 pulmonary embolism, rectal cancer diagnosed after. Abnormal Pap with benign biopsy 10/2015.  Exam: Appears well. Ultrasound: T/V anteverted uterus with thin endometrial cavity, endometrium 2.9 mm. Myometrium homogeneous. Right ovary atrophic. Left ovary atrophic. Normal echo. No apparent mass in the right or left adnexal. Negative cul-de-sac.  Normal postmenopausal GYN ultrasound  Plan: Reviewed normality of ultrasound, atrophic ovaries. Reviewed most likely symptoms are related to rectal cancer treatment.

## 2016-01-22 ENCOUNTER — Telehealth: Payer: Self-pay | Admitting: Oncology

## 2016-01-22 NOTE — Telephone Encounter (Signed)
Due to call day moved 4/24 lab/BS to 4/24 @ 3 pm. Spoke with patient she is aware.

## 2016-01-23 ENCOUNTER — Ambulatory Visit: Payer: 59 | Admitting: Oncology

## 2016-01-23 ENCOUNTER — Other Ambulatory Visit: Payer: 59

## 2016-02-12 ENCOUNTER — Ambulatory Visit: Payer: 59 | Admitting: Oncology

## 2016-02-12 ENCOUNTER — Telehealth: Payer: Self-pay | Admitting: Oncology

## 2016-02-12 ENCOUNTER — Ambulatory Visit (HOSPITAL_BASED_OUTPATIENT_CLINIC_OR_DEPARTMENT_OTHER): Payer: 59 | Admitting: Nurse Practitioner

## 2016-02-12 ENCOUNTER — Other Ambulatory Visit (HOSPITAL_BASED_OUTPATIENT_CLINIC_OR_DEPARTMENT_OTHER): Payer: 59

## 2016-02-12 VITALS — BP 141/84 | HR 69 | Temp 98.3°F | Resp 18 | Ht 71.0 in | Wt 215.7 lb

## 2016-02-12 DIAGNOSIS — Z86711 Personal history of pulmonary embolism: Secondary | ICD-10-CM | POA: Diagnosis not present

## 2016-02-12 DIAGNOSIS — Z85048 Personal history of other malignant neoplasm of rectum, rectosigmoid junction, and anus: Secondary | ICD-10-CM

## 2016-02-12 DIAGNOSIS — R32 Unspecified urinary incontinence: Secondary | ICD-10-CM

## 2016-02-12 DIAGNOSIS — R159 Full incontinence of feces: Secondary | ICD-10-CM | POA: Diagnosis not present

## 2016-02-12 DIAGNOSIS — C20 Malignant neoplasm of rectum: Secondary | ICD-10-CM

## 2016-02-12 NOTE — Telephone Encounter (Signed)
Gave and printed appt sched and avs for pt for April 2018 °

## 2016-02-12 NOTE — Progress Notes (Signed)
  [place] OFFICE PROGRESS NOTE   Diagnosis:  Rectal cancer  INTERVAL HISTORY:   [name] returns as scheduled. She feels well. She has a good appetite and good energy level. She continues to have frequent bowel movements. She has periodic loose stools and takes Imodium as needed. She notes less fecal incontinence, maybe once or twice a week. She has had some nausea/vomiting related to a new diabetes medication. No abdominal pain.  Objective:  Vital signs in last 24 hours:  Blood pressure 141/84, pulse 69, temperature 98.3 F (36.8 C), temperature source Oral, resp. rate 18, height 5\' 11"  (1.803 m), weight 215 lb 11.2 oz (97.841 kg), last menstrual period 09/12/2011, SpO2 100 %.    HEENT: No thrush or ulcers. Lymphatics: No palpable cervical, supraclavicular, axillary or inguinal lymph nodes. Resp: Lungs clear bilaterally. Cardio: Regular rate and rhythm. GI: Abdomen soft and nontender. No hepatomegaly. No mass. Vascular: No leg edema.   Lab Results:  Lab Results  Component Value Date   WBC 3.3* 12/17/2013   HGB 13.3 12/17/2013   HCT 40.6 12/17/2013   MCV 90.3 12/17/2013   PLT 219 12/17/2013   NEUTROABS 1.6 12/17/2013    Imaging:  No results found.  Medications: I have reviewed the patient's current medications.  Assessment/Plan: 1. Rectal cancer, stage III (T3 N1b), status post a low anterior resection 01/31/2011, status post radiation with continuous infusion 5-FU 03/19/2011 through 04/30/2011. Status post FOLFOX beginning 07/03/2011 for 8 cycles. Surveillance colonoscopy 02/19/2012-negative for recurrent cancer or polyps. Surveillance colonoscopy 04/15/2014-entire examined colon normal; terminal ileum appeared normal. Next colonoscopy recommended at a 5 year interval.  Surveillance CT scans negative 12/17/2013 2. Pulmonary embolism March 2012, status post IVC filter placement prior to cancer surgery in April 2012, followed by Dr. Chase Caller. Risk  factors for pulmonary embolism felt to be related to obesity, oral contraceptive use, rectal cancer, and sedentary job. Now maintained off of anticoagulation  3. Removal of Port-A-Cath March 2014 4. Urinary/fecal incontinence. Initially improved with the pelvic physical therapy program.   Disposition: [name] remains in clinical remission from rectal cancer. We will follow-up on the CEA from today. She will return for an office visit and CEA in one year.  Plan reviewed with Dr. Benay Spice.    Ned Card ANP/GNP-BC   02/12/2016  3:57 PM

## 2016-02-13 LAB — CEA: CEA: 0.6 ng/mL (ref 0.0–4.7)

## 2016-02-13 LAB — CEA (PARALLEL TESTING)

## 2016-02-14 ENCOUNTER — Telehealth: Payer: Self-pay

## 2016-02-14 NOTE — Telephone Encounter (Signed)
Called and informed patient of normal CEA results. Pt verbalized understanding and denies any questions or concerns at this time.

## 2016-02-14 NOTE — Telephone Encounter (Signed)
-----   Message from Owens Shark, NP sent at 02/13/2016  4:48 PM EDT ----- Please let her know CEA is normal.

## 2016-03-11 ENCOUNTER — Telehealth: Payer: Self-pay | Admitting: Internal Medicine

## 2016-03-11 ENCOUNTER — Other Ambulatory Visit: Payer: Self-pay | Admitting: Nurse Practitioner

## 2016-03-11 ENCOUNTER — Other Ambulatory Visit: Payer: Self-pay | Admitting: *Deleted

## 2016-03-11 DIAGNOSIS — R609 Edema, unspecified: Secondary | ICD-10-CM

## 2016-03-11 DIAGNOSIS — R221 Localized swelling, mass and lump, neck: Secondary | ICD-10-CM

## 2016-03-11 NOTE — Telephone Encounter (Signed)
Spoke with pt. States that she is having pain around her IVC filter. She wanted to know who she should contact about this. Advised her that she would need to contact the doctor who placed this for her. She agreed and verbalized understanding.

## 2016-03-11 NOTE — Progress Notes (Signed)
Patient called regarding upper back and abdominal pain off and on for the past year. She has history of Colon Ca and had PE in 2012 (seen by Dr. Chase Caller). Dr. Bridgett Larsson placed an IVC filter on 01-24-11 because of this PE. Patient called Dr. Golden Pop office about this chest pain and they told her to contact our office.   I told Ms. Hynek that we could make an appt for her to see Dr. Bridgett Larsson and that we would get a CXR prior as per staff message from Dr. Bridgett Larsson today;    Doubt it has anything to do with the IVC filter. CXR to check for embolization of IVC filter strut prior to appointment.  Patient says she may see her PCP for further investigation of her pain. She will call us back if she wants to see Dr. Bridgett Larsson and have the CXR ordered by [place].

## 2016-03-19 ENCOUNTER — Other Ambulatory Visit: Payer: Self-pay | Admitting: Internal Medicine

## 2016-03-19 DIAGNOSIS — R221 Localized swelling, mass and lump, neck: Secondary | ICD-10-CM

## 2016-03-19 DIAGNOSIS — R609 Edema, unspecified: Secondary | ICD-10-CM

## 2016-03-25 ENCOUNTER — Ambulatory Visit
Admission: RE | Admit: 2016-03-25 | Discharge: 2016-03-25 | Disposition: A | Discharge status: Home / Self Care | Payer: 59 | Source: Ambulatory Visit | Attending: Internal Medicine | Admitting: Internal Medicine

## 2016-03-25 ENCOUNTER — Other Ambulatory Visit: Payer: Self-pay | Admitting: Internal Medicine

## 2016-03-25 ENCOUNTER — Other Ambulatory Visit: Payer: Self-pay | Admitting: Nurse Practitioner

## 2016-03-25 DIAGNOSIS — M25512 Pain in left shoulder: Secondary | ICD-10-CM

## 2016-03-25 DIAGNOSIS — R221 Localized swelling, mass and lump, neck: Secondary | ICD-10-CM

## 2016-03-25 DIAGNOSIS — R609 Edema, unspecified: Secondary | ICD-10-CM

## 2016-04-25 ENCOUNTER — Other Ambulatory Visit: Payer: Self-pay | Admitting: Internal Medicine

## 2016-04-25 DIAGNOSIS — Z1231 Encounter for screening mammogram for malignant neoplasm of breast: Secondary | ICD-10-CM

## 2016-04-29 ENCOUNTER — Ambulatory Visit
Admission: RE | Admit: 2016-04-29 | Discharge: 2016-04-29 | Disposition: A | Discharge status: Home / Self Care | Payer: 59 | Source: Ambulatory Visit | Attending: Internal Medicine | Admitting: Internal Medicine

## 2016-04-29 DIAGNOSIS — Z1231 Encounter for screening mammogram for malignant neoplasm of breast: Secondary | ICD-10-CM

## 2016-12-31 ENCOUNTER — Telehealth: Payer: Self-pay | Admitting: Oncology

## 2016-12-31 NOTE — Telephone Encounter (Signed)
Patient called and wanted an earlier appointment than 4/23  Either 3/19 or first week in April

## 2017-02-10 ENCOUNTER — Other Ambulatory Visit: Payer: Self-pay | Admitting: *Deleted

## 2017-02-10 ENCOUNTER — Other Ambulatory Visit (HOSPITAL_BASED_OUTPATIENT_CLINIC_OR_DEPARTMENT_OTHER): Payer: 59

## 2017-02-10 ENCOUNTER — Telehealth: Payer: Self-pay | Admitting: Oncology

## 2017-02-10 ENCOUNTER — Ambulatory Visit (HOSPITAL_BASED_OUTPATIENT_CLINIC_OR_DEPARTMENT_OTHER): Payer: 59 | Admitting: Oncology

## 2017-02-10 VITALS — BP 126/75 | HR 72 | Temp 97.5°F | Resp 18 | Ht 71.0 in | Wt 212.1 lb

## 2017-02-10 DIAGNOSIS — R159 Full incontinence of feces: Secondary | ICD-10-CM

## 2017-02-10 DIAGNOSIS — C20 Malignant neoplasm of rectum: Secondary | ICD-10-CM

## 2017-02-10 DIAGNOSIS — K625 Hemorrhage of anus and rectum: Secondary | ICD-10-CM

## 2017-02-10 DIAGNOSIS — R739 Hyperglycemia, unspecified: Secondary | ICD-10-CM

## 2017-02-10 DIAGNOSIS — R350 Frequency of micturition: Secondary | ICD-10-CM

## 2017-02-10 DIAGNOSIS — Z86711 Personal history of pulmonary embolism: Secondary | ICD-10-CM

## 2017-02-10 DIAGNOSIS — R32 Unspecified urinary incontinence: Secondary | ICD-10-CM

## 2017-02-10 DIAGNOSIS — K6289 Other specified diseases of anus and rectum: Secondary | ICD-10-CM

## 2017-02-10 DIAGNOSIS — Z85048 Personal history of other malignant neoplasm of rectum, rectosigmoid junction, and anus: Secondary | ICD-10-CM

## 2017-02-10 LAB — URINALYSIS, MICROSCOPIC - CHCC
BLOOD: NEGATIVE
Bilirubin (Urine): NEGATIVE
GLUCOSE UR CHCC: 1000 mg/dL
Ketones: NEGATIVE mg/dL
NITRITE: POSITIVE
PH: 5 (ref 4.6–8.0)
PROTEIN: NEGATIVE mg/dL
SPECIFIC GRAVITY, URINE: 1.025 (ref 1.003–1.035)
UROBILINOGEN UR: 0.2 mg/dL (ref 0.2–1)

## 2017-02-10 MED ORDER — CIPROFLOXACIN HCL 500 MG PO TABS: 500.0000 mg | ORAL_TABLET | Freq: Two times a day (BID) | ORAL | 0 refills | Status: DC

## 2017-02-10 NOTE — Telephone Encounter (Signed)
Notified pt, UA shows UTI. Cipro e-scribed to pharmacy, per Dr. Benay Spice. Pt voiced understanding of instructions. Pharmacy changed to [facility]

## 2017-02-10 NOTE — Progress Notes (Signed)
  [place] OFFICE PROGRESS NOTE   Diagnosis: Rectal cancer  INTERVAL HISTORY:   [name] returns as scheduled. She continues to have rectal incontinence, but this has improved. She has intermittent episodes of rectal frequency and sometimes has bleeding. This pattern has been present since undergoing rectal surgery and has not changed. She reports urinary frequency for the past week and wonders whether she has an infection. She was recently diagnosed with costochondritis and completed a steroid taper. The blood sugar has been elevated.  Objective:  Vital signs in last 24 hours:  Blood pressure 126/75, pulse 72, temperature 97.5 F (36.4 C), temperature source Oral, resp. rate 18, height 5\' 11"  (1.803 m), weight 212 lb 1.6 oz (96.2 kg), last menstrual period 09/12/2011, SpO2 100 %.    HEENT: Neck without mass Lymphatics: No cervical, supraclavicular, axillary, or inguinal nodes Resp: Lungs clear bilaterally Cardio: Regular rate and rhythm GI: No hepatosplenomegaly, no mass, nontender Vascular: No leg edema   Lab Results:  CEA-pending  Medications: I have reviewed the patient's current medications.  Assessment/Plan: 1. Rectal cancer, stage III (T3 N1b), status post a low anterior resection 01/31/2011, status post radiation with continuous infusion 5-FU 03/19/2011 through 04/30/2011. Status post FOLFOX beginning 07/03/2011 for 8 cycles. Surveillance colonoscopy 02/19/2012-negative for recurrent cancer or polyps. Surveillance colonoscopy 04/15/2014-entire examined colon normal; terminal ileum appeared normal. Next colonoscopy recommended at a 5 year interval.  Surveillance CT scans negative 12/17/2013 2. Pulmonary embolism March 2012, status post IVC filter placement prior to cancer surgery in April 2012, followed by Dr. Chase Caller. Risk factors for pulmonary embolism felt to be related to obesity, oral contraceptive use, rectal cancer, and sedentary job. Now  maintained off of anticoagulation  3. Removal of Port-A-Cath March 2014 4. Urinary/fecal incontinence. Initially improved with the pelvic physical therapy program.   Disposition:  [name] remains in clinical remission from rectal cancer. She continues colonoscopy follow-up with Dr. Collene Mares. She will see Dr. Collene Mares for evaluation of rectal bleeding. We will check a urinalysis today to look for evidence of a urinary tract infection.  We will follow-up on the CEA from today. She would like to continue follow-up at the [facility]. She will return for an office visit and CEA in one year.  15 minutes were spent with the patient today. The majority of the time was used for counseling and coordination of care.  Betsy Coder, MD  02/10/2017  3:48 PM

## 2017-02-10 NOTE — Telephone Encounter (Signed)
Gave patient AVS and calender per 4/23 los.  

## 2017-02-11 LAB — CEA (IN HOUSE-CHCC)

## 2017-02-11 LAB — CEA: CEA: 0.8 ng/mL (ref 0.0–4.7)

## 2017-02-13 ENCOUNTER — Telehealth: Payer: Self-pay

## 2017-02-13 LAB — URINE CULTURE

## 2017-02-13 NOTE — Telephone Encounter (Signed)
-----   Message from Owens Shark, NP sent at 02/11/2017  4:13 PM EDT ----- Please let her know the CEA is normal

## 2017-02-13 NOTE — Telephone Encounter (Signed)
Called and informed pt cea was normal and to follow up as scheduled. Pt verbalized understanding and denies any questions or concerns at this time.

## 2017-11-24 ENCOUNTER — Telehealth: Payer: Self-pay | Admitting: Nurse Practitioner

## 2017-11-24 NOTE — Telephone Encounter (Signed)
PAL - moved lab/fu from 4/24 to 4/30. Spoke with patient she is aware.

## 2017-12-31 ENCOUNTER — Other Ambulatory Visit: Payer: Self-pay | Admitting: Internal Medicine

## 2017-12-31 DIAGNOSIS — Z1231 Encounter for screening mammogram for malignant neoplasm of breast: Secondary | ICD-10-CM

## 2018-01-19 ENCOUNTER — Ambulatory Visit
Admission: RE | Admit: 2018-01-19 | Discharge: 2018-01-19 | Disposition: A | Discharge status: Home / Self Care | Payer: 59 | Source: Ambulatory Visit | Attending: Internal Medicine | Admitting: Internal Medicine

## 2018-01-19 DIAGNOSIS — Z1231 Encounter for screening mammogram for malignant neoplasm of breast: Secondary | ICD-10-CM

## 2018-01-21 LAB — BASIC METABOLIC PANEL
BUN: 12 (ref 4–21)
CREATININE: 0.9 (ref 0.5–1.1)
Glucose: 125
POTASSIUM: 4.8 (ref 3.4–5.3)
Sodium: 144 (ref 137–147)

## 2018-01-21 LAB — LIPID PANEL
CHOLESTEROL: 218 — AB (ref 0–200)
HDL: 48 (ref 35–70)
LDL Cholesterol: 150
TRIGLYCERIDES: 100 (ref 40–160)

## 2018-01-21 LAB — CBC AND DIFFERENTIAL
HCT: 43 (ref 36–46)
PLATELETS: 225 (ref 150–399)
WBC: 2.9

## 2018-01-21 LAB — HEPATIC FUNCTION PANEL
ALT: 24 (ref 7–35)
AST: 18 (ref 13–35)
Alkaline Phosphatase: 120 (ref 25–125)

## 2018-02-10 ENCOUNTER — Other Ambulatory Visit: Payer: 59

## 2018-02-10 ENCOUNTER — Ambulatory Visit: Payer: 59 | Admitting: Nurse Practitioner

## 2018-02-16 ENCOUNTER — Telehealth: Payer: Self-pay | Admitting: Nurse Practitioner

## 2018-02-16 NOTE — Telephone Encounter (Signed)
Patient called to reschedule  °

## 2018-02-17 ENCOUNTER — Ambulatory Visit: Payer: 59 | Admitting: Nurse Practitioner

## 2018-02-17 ENCOUNTER — Other Ambulatory Visit: Payer: 59

## 2018-02-24 ENCOUNTER — Inpatient Hospital Stay: Payer: 59 | Admitting: Nurse Practitioner

## 2018-02-24 ENCOUNTER — Inpatient Hospital Stay: Payer: 59

## 2018-03-03 ENCOUNTER — Other Ambulatory Visit: Payer: Self-pay | Admitting: Nurse Practitioner

## 2018-03-03 DIAGNOSIS — C20 Malignant neoplasm of rectum: Secondary | ICD-10-CM

## 2018-03-04 ENCOUNTER — Inpatient Hospital Stay: Payer: 59 | Attending: Oncology

## 2018-03-04 ENCOUNTER — Encounter: Payer: Self-pay | Admitting: Nurse Practitioner

## 2018-03-04 ENCOUNTER — Telehealth: Payer: Self-pay | Admitting: Nurse Practitioner

## 2018-03-04 ENCOUNTER — Inpatient Hospital Stay (HOSPITAL_BASED_OUTPATIENT_CLINIC_OR_DEPARTMENT_OTHER): Payer: 59 | Admitting: Nurse Practitioner

## 2018-03-04 VITALS — BP 143/86 | HR 60 | Temp 97.6°F | Resp 19 | Ht 71.0 in | Wt 219.7 lb

## 2018-03-04 DIAGNOSIS — Z86711 Personal history of pulmonary embolism: Secondary | ICD-10-CM | POA: Diagnosis not present

## 2018-03-04 DIAGNOSIS — Z85038 Personal history of other malignant neoplasm of large intestine: Secondary | ICD-10-CM | POA: Diagnosis not present

## 2018-03-04 DIAGNOSIS — R159 Full incontinence of feces: Secondary | ICD-10-CM | POA: Insufficient documentation

## 2018-03-04 DIAGNOSIS — Z923 Personal history of irradiation: Secondary | ICD-10-CM | POA: Insufficient documentation

## 2018-03-04 DIAGNOSIS — R32 Unspecified urinary incontinence: Secondary | ICD-10-CM | POA: Diagnosis not present

## 2018-03-04 DIAGNOSIS — Z85048 Personal history of other malignant neoplasm of rectum, rectosigmoid junction, and anus: Secondary | ICD-10-CM

## 2018-03-04 DIAGNOSIS — Z9221 Personal history of antineoplastic chemotherapy: Secondary | ICD-10-CM | POA: Diagnosis not present

## 2018-03-04 DIAGNOSIS — C20 Malignant neoplasm of rectum: Secondary | ICD-10-CM

## 2018-03-04 LAB — CEA (IN HOUSE-CHCC)

## 2018-03-04 NOTE — Progress Notes (Signed)
  [facility] OFFICE PROGRESS NOTE   Diagnosis: Rectal cancer  INTERVAL HISTORY:   [name] returns as scheduled.  No change in baseline bowel habits with rectal frequency, occasional bleeding.  She has had the same bowel pattern since undergoing rectal surgery.  She overall feels well.  She denies abdominal pain.  She has a good appetite.  Objective:  Vital signs in last 24 hours:  Blood pressure (!) 143/86, pulse 60, temperature 97.6 F (36.4 C), temperature source Oral, resp. rate 19, height 5\' 11"  (1.803 m), weight 219 lb 11.2 oz (99.7 kg), last menstrual period 09/12/2011, SpO2 97 %.    HEENT: Neck without mass. Lymphatics: No palpable cervical, supraclavicular, axillary or inguinal lymph nodes. Resp: Lungs clear bilaterally. Cardio: Regular rate and rhythm. GI: Abdomen soft and nontender.  No hepatomegaly. Vascular: No leg edema.  Lab Results:  Lab Results  Component Value Date   WBC 3.3 (L) 12/17/2013   HGB 13.3 12/17/2013   HCT 40.6 12/17/2013   MCV 90.3 12/17/2013   PLT 219 12/17/2013   NEUTROABS 1.6 12/17/2013   CEA pending  Imaging:  No results found.  Medications: I have reviewed the patient's current medications.  Assessment/Plan: 1. Rectal cancer, stage III (T3 N1b), status post a low anterior resection 01/31/2011, status post radiation with continuous infusion 5-FU 03/19/2011 through 04/30/2011. Status post FOLFOX beginning 07/03/2011 for 8 cycles. Surveillance colonoscopy 02/19/2012-negative for recurrent cancer or polyps. Surveillance colonoscopy 04/15/2014-entire examined colon normal; terminal ileum appeared normal. Next colonoscopy recommended at a 5 year interval.  Surveillance CT scans negative 12/17/2013 2. Pulmonary embolism March 2012, status post IVC filter placement prior to cancer surgery in April 2012, followed by Dr. Chase Caller. Risk factors for pulmonary embolism felt to be related to obesity, oral contraceptive use, rectal  cancer, and sedentary job. Now maintained off of anticoagulation  3. Removal of Port-A-Cath March 2014 4. Urinary/fecal incontinence. Initially improved with the pelvic physical therapy program.     Disposition: [name] remains in clinical remission from rectal cancer.  We will follow-up on the CEA from today.  She will return for a follow-up visit and CEA in 1 year.  We made a referral to genetics due to her young age at diagnosis.    Ned Card ANP/GNP-BC   03/04/2018  3:06 PM

## 2018-03-04 NOTE — Telephone Encounter (Signed)
Scheduled appt per 5/15 los - Gave patient AVS and calender per los.  

## 2018-03-06 ENCOUNTER — Telehealth: Payer: Self-pay | Admitting: *Deleted

## 2018-03-06 NOTE — Telephone Encounter (Signed)
Notified pt of normal CEA. She voiced appreciation for call.

## 2018-03-06 NOTE — Telephone Encounter (Signed)
-----   Message from Owens Shark, NP sent at 03/04/2018  3:55 PM EDT ----- Please let her know CEA is normal.  Follow-up as scheduled.

## 2018-04-06 ENCOUNTER — Telehealth: Payer: Self-pay | Admitting: Oncology

## 2018-04-06 NOTE — Telephone Encounter (Signed)
Patient called to cancel insurance will not cover

## 2018-04-13 ENCOUNTER — Encounter: Payer: 59 | Admitting: Genetics

## 2018-07-08 ENCOUNTER — Encounter: Payer: Self-pay | Admitting: Nurse Practitioner

## 2018-07-08 DIAGNOSIS — E039 Hypothyroidism, unspecified: Secondary | ICD-10-CM

## 2018-07-23 ENCOUNTER — Ambulatory Visit: Payer: 59 | Admitting: Nurse Practitioner

## 2018-10-26 ENCOUNTER — Encounter: Payer: Self-pay | Admitting: Women's Health

## 2018-10-26 ENCOUNTER — Ambulatory Visit (INDEPENDENT_AMBULATORY_CARE_PROVIDER_SITE_OTHER): Payer: No Typology Code available for payment source | Admitting: Women's Health

## 2018-10-26 VITALS — BP 124/80 | Ht 71.0 in | Wt 215.0 lb

## 2018-10-26 DIAGNOSIS — Z13820 Encounter for screening for osteoporosis: Secondary | ICD-10-CM

## 2018-10-26 DIAGNOSIS — Z01419 Encounter for gynecological examination (general) (routine) without abnormal findings: Secondary | ICD-10-CM | POA: Diagnosis not present

## 2018-10-26 NOTE — Progress Notes (Signed)
[name] 02-01-1966 [id number]    History:    Presents for annual exam.  Postmenopausal/no bleeding since 02/09/11. Normal mammogram history.  Abnormal Pap many years ago with cryo.  Not sexually active husband ED.  09-Feb-2011 bilateral PEs, 01/2011 rectal/colon cancer had surgery, chemotherapy and radiation, doing better, occasional incontinence not daily has had physical therapy.  Negative genetic testing.  Also diagnosed in 2011/02/09 with diabetes and hypertension primary care manages .  2014-02-08- colonoscopy is scheduled for 2019-02-09.    Past medical history, past surgical history, family history and social history were all reviewed and documented in the EPIC chart.  Works for [employer]. 3 sons , 79 yo diabetes poor self-care, son 63 doing well.  Mother died of lung cancer in 02/09/2011.  ROS:  A ROS was performed and pertinent positives and negatives are included.  Exam:  Vitals:   10/26/18 1116  BP: 124/80  Weight: 215 lb (97.5 kg)  Height: 5\' 11"  (1.803 m)   Body mass index is 29.99 kg/m.   General appearance:  Normal Thyroid:  Symmetrical, normal in size, without palpable masses or nodularity. Respiratory  Auscultation:  Clear without wheezing or rhonchi Cardiovascular  Auscultation:  Regular rate, without rubs, murmurs or gallops  Edema/varicosities:  Not grossly evident Abdominal  Soft,nontender, without masses, guarding or rebound.  Liver/spleen:  No organomegaly noted  Hernia:  None appreciated  Skin  Inspection:  Grossly normal   Breasts: Examined lying and sitting.     Right: Without masses, retractions, discharge or axillary adenopathy.     Left: Without masses, retractions, discharge or axillary adenopathy. Gentitourinary   Inguinal/mons:  Normal without inguinal adenopathy  External genitalia:  Normal  BUS/Urethra/Skene's glands:  Normal  Vagina: +1 cystocele  Cervix:  Normal  Uterus:  normal in size, shape and contour.  Midline and mobile  Adnexa/parametria:      Rt: Without masses or tenderness.   Lt: Without masses or tenderness.  Anus and perineum: Normal  Digital rectal exam: Normal sphincter tone without palpated masses or tenderness  Assessment/Plan:  53 y.o. MWF G3, P3 for annual exam with occasional stool incontinence.     Postmenopausal/no HRT/no bleeding +1 cystocele occasional stress incontinence 2011-02-09 bilateral PEs on baby aspirin 01/2011 rectal/colon cancer Diabetes/hypertension primary care manages labs and meds Situational stress/marital issues no infidelity or abuse  Plan: DEXA, will schedule.  SBEs, continue annual screening mammogram, calcium rich foods, vitamin D 2000 daily encouraged.  Reviewed importance of weightbearing and balance type exercise.  Urged to decrease simple carbs/calories.  Marital issues causing situational stress, counseling, self-care, leisure activities encouraged.  Schedule follow-up colonoscopy with Dr. Collene [name].  Offered physical therapy for full incontinence declines at this time will call if wishes to pursue.  Pap with HR HPV typing, new screening guidelines reviewed.  [name] [facility], 11:51 AM 10/26/2018

## 2018-10-26 NOTE — Addendum Note (Signed)
Addended by: Lorine Bears on: 10/26/2018 12:49 PM   Modules accepted: Orders

## 2018-10-26 NOTE — Patient Instructions (Signed)

## 2018-10-26 NOTE — Addendum Note (Signed)
Addended by: Joaquin Music on: 10/26/2018 02:19 PM   Modules accepted: Orders

## 2018-10-27 LAB — PAP, TP IMAGING W/ HPV RNA, RFLX HPV TYPE 16,18/45: HPV DNA High Risk: DETECTED — AB

## 2018-11-21 DIAGNOSIS — R87810 Cervical high risk human papillomavirus (HPV) DNA test positive: Secondary | ICD-10-CM

## 2018-11-21 DIAGNOSIS — R87610 Atypical squamous cells of undetermined significance on cytologic smear of cervix (ASC-US): Secondary | ICD-10-CM

## 2018-11-21 HISTORY — DX: Atypical squamous cells of undetermined significance on cytologic smear of cervix (ASC-US): R87.610

## 2018-11-21 HISTORY — DX: Cervical high risk human papillomavirus (HPV) DNA test positive: R87.810

## 2018-11-27 ENCOUNTER — Encounter: Payer: Self-pay | Admitting: Gynecology

## 2018-11-27 ENCOUNTER — Ambulatory Visit (INDEPENDENT_AMBULATORY_CARE_PROVIDER_SITE_OTHER): Payer: No Typology Code available for payment source | Admitting: Gynecology

## 2018-11-27 VITALS — BP 122/80

## 2018-11-27 DIAGNOSIS — R87610 Atypical squamous cells of undetermined significance on cytologic smear of cervix (ASC-US): Secondary | ICD-10-CM | POA: Diagnosis not present

## 2018-11-27 DIAGNOSIS — R87810 Cervical high risk human papillomavirus (HPV) DNA test positive: Secondary | ICD-10-CM

## 2018-11-27 NOTE — Progress Notes (Signed)
    [name] 1966/10/04 [id number]        52 y.o.  [id number] presents for colposcopy having recently seen Izora Gala where her Pap smear showed ASCUS positive high risk HPV.  She has a history of positive HPV in 2015 and 2016 with negative subtypes 16, 18/45, which was the last time she had a Pap smear before her most recent Pap smear.  She does have a history of cryo a number of years ago.  Past medical history,surgical history, problem list, medications, allergies, family history and social history were all reviewed and documented in the EPIC chart.  Directed ROS with pertinent positives and negatives documented in the history of present illness/assessment and plan.  Exam: Wandra Scot assistant Vitals:   11/27/18 1150  BP: 122/80   General appearance:  Normal Abdomen soft nontender without masses guarding rebound Pelvic external BUS vagina normal.  Cervix grossly normal.  Uterus normal size midline mobile nontender.  Adnexa without masses or tenderness.  Colposcopy performed after acetic acid cleanse is adequate noting small cervical opening with tufting of the endocervix.  No abnormalities seen.  ECC performed.  Patient tolerated well.  Physical Exam  Genitourinary:      Assessment/Plan:  53 y.o. [id number] with history of positive high risk HPV in the past.  Most recent Pap smear showing high risk HPV with ASCUS.  We discussed the whole issue of dysplasia, high-grade/low-grade, progression/regression and the HPV association.  Colposcopy today is adequate normal noting some tufting of the endocervical tissue consistent with a cryo history.  Patient will follow-up for biopsy result.  If negative or low-grade then plan expectant management with follow-up Pap smear/HPV in 1 year.  If otherwise then will triage based upon results.    Anastasio Auerbach MD, 12:29 PM 11/27/2018

## 2018-11-27 NOTE — Patient Instructions (Signed)
Office will call you with biopsy results 

## 2018-12-01 ENCOUNTER — Encounter: Payer: Self-pay | Admitting: Gynecology

## 2018-12-01 LAB — TISSUE SPECIMEN

## 2018-12-01 LAB — PATHOLOGY

## 2018-12-03 ENCOUNTER — Ambulatory Visit (INDEPENDENT_AMBULATORY_CARE_PROVIDER_SITE_OTHER): Payer: No Typology Code available for payment source | Admitting: Nurse Practitioner

## 2018-12-03 ENCOUNTER — Encounter: Payer: Self-pay | Admitting: Nurse Practitioner

## 2018-12-03 VITALS — BP 110/80 | HR 71 | Temp 98.1°F | Ht 70.0 in | Wt 216.4 lb

## 2018-12-03 DIAGNOSIS — E119 Type 2 diabetes mellitus without complications: Secondary | ICD-10-CM | POA: Diagnosis not present

## 2018-12-03 DIAGNOSIS — E039 Hypothyroidism, unspecified: Secondary | ICD-10-CM | POA: Diagnosis not present

## 2018-12-03 DIAGNOSIS — E782 Mixed hyperlipidemia: Secondary | ICD-10-CM | POA: Diagnosis not present

## 2018-12-03 NOTE — Progress Notes (Signed)
Subjective:     Patient ID: [name] , female    DOB: [birth year] , 53 y.o.   MRN: [id number]   Chief Complaint  Patient presents with  . Hyperlipidemia    HPI  She is being followed by [name] for her DM and thyroid seen last week.  HgbA1c was 8.1 she feels due to the holiday eating.    Hyperlipidemia  This is a chronic problem. The current episode started more than 1 year ago. The problem is controlled. She has no history of chronic renal disease. There are no known factors aggravating her hyperlipidemia. Pertinent negatives include no chest pain. Current antihyperlipidemic treatment includes statins. There are no compliance problems.  Risk factors for coronary artery disease include diabetes mellitus and dyslipidemia.     Past Medical History:  Diagnosis Date  . 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  ECC with HPV effect  . Cancer of colon with rectum ([place] [facility]) 2012  . Headache(784.0)    experiences migraine every couple of weeks. takes only excedrin  . 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  colposcopy adequate normal 10/2015   . Hyperlipidemia   . Pulmonary embolism ([place])   . STD (sexually transmitted disease) 1991   POS GC-TREATED     Family History  Problem Relation Age of Onset  . Heart disease Father   . Lung cancer Mother   . Brain cancer Mother   . Diabetes Mother   . Hypertension Mother   . Diabetes Brother   . Kidney disease Brother   . Cervical cancer Paternal Grandmother   . Breast cancer Neg Hx      Current Outpatient Medications:  .  aspirin 81 MG tablet, Take 81 mg by mouth daily., Disp: , Rfl:  .  aspirin-acetaminophen-caffeine (EXCEDRIN MIGRAINE) 250-250-65 MG per tablet, Take 2 tablets by mouth every 6 (six) hours as needed. For migraines, Disp: , Rfl:  .  cholecalciferol (VITAMIN D) 400 UNITS TABS tablet,  Take 400 Units by mouth., Disp: , Rfl:  .  cyanocobalamin 500 MCG tablet, Take 500 mcg by mouth daily., Disp: , Rfl:  .  glimepiride (AMARYL) 1 MG tablet, Take 1 mg by mouth daily with breakfast., Disp: , Rfl:  .  levothyroxine (SYNTHROID, LEVOTHROID) 100 MCG tablet, , Disp: , Rfl:  .  Loperamide HCl (IMODIUM PO), Take by mouth., Disp: , Rfl:  .  Multiple Vitamin (MULTIVITAMIN) tablet, Take 1 tablet by mouth daily., Disp: , Rfl:  .  pyridOXINE (VITAMIN B-6) 100 MG tablet, Take 100 mg by mouth 2 (two) times daily., Disp: , Rfl:  .  simvastatin (ZOCOR) 10 MG tablet, Take 1 tablet by mouth daily., Disp: , Rfl:    Allergies  Allergen Reactions  . Metformin And Related Other (See Comments)    Chest pain  . Penicillins Hives     Review of Systems  Constitutional: Negative.  Negative for fatigue.  Respiratory: Negative.   Cardiovascular: Negative.  Negative for chest pain, palpitations and leg swelling.  Gastrointestinal: Negative.   Endocrine: Positive for polyuria. Negative for polydipsia and polyphagia.  Skin: Negative.   Neurological: Negative.  Negative for dizziness.  Psychiatric/Behavioral: Negative for confusion. The patient is not nervous/anxious.      Today's Vitals   12/03/18 0948  BP: 110/80  Pulse: 71  Temp: 98.1 F (36.7 C)  TempSrc:  Oral  Weight: 216 lb 6.4 oz (98.2 kg)  Height: 5\' 10"  (1.778 m)  PainSc: 0-No pain   Body mass index is 31.05 kg/m.   Objective:  Physical Exam Constitutional:      Appearance: She is well-developed.  Neck:     Musculoskeletal: Normal range of motion and neck supple.  Cardiovascular:     Rate and Rhythm: Normal rate and regular rhythm.     Heart sounds: Normal heart sounds. No murmur.  Pulmonary:     Effort: Pulmonary effort is normal.     Breath sounds: Normal breath sounds.  Chest:     Chest wall: No tenderness.  Musculoskeletal: Normal range of motion.  Skin:    General: Skin is warm and dry.     Capillary Refill:  Capillary refill takes less than 2 seconds.  Neurological:     General: No focal deficit present.     Mental Status: She is alert and oriented to person, place, and time.  Psychiatric:        Mood and Affect: Mood normal.         Assessment And Plan:     1. Mixed hyperlipidemia  Chronic, not well controlled according to guidelines for diabetes and hypercholesteremia.   Will check lipid panel   Encouraged to increase her fiber intake  Continue with current medications - Lipid panel  2. Type 2 diabetes mellitus without complication, without long-term current use of insulin (HCC)  Chronic, poorly controlled  Continue with current medications provided by [name]  Encouraged to limit intake of sugary foods and drinks  Encouraged to increase physical activity to 150 minutes per week   3. Acquired hypothyroidism  Chronic, controlled, being followed by [name]  Continue with current medications  [name] Brine, FNP

## 2018-12-04 ENCOUNTER — Other Ambulatory Visit: Payer: Self-pay | Admitting: Nurse Practitioner

## 2018-12-04 DIAGNOSIS — E782 Mixed hyperlipidemia: Secondary | ICD-10-CM

## 2018-12-04 LAB — LIPID PANEL
Chol/HDL Ratio: 4.7 ratio — ABNORMAL HIGH (ref 0.0–4.4)
Cholesterol, Total: 244 mg/dL — ABNORMAL HIGH (ref 100–199)
HDL: 52 mg/dL (ref >39)
LDL Calculated: 166 mg/dL — ABNORMAL HIGH (ref 0–99)
Triglycerides: 130 mg/dL (ref 0–149)
VLDL Cholesterol Cal: 26 mg/dL (ref 5–40)

## 2018-12-04 MED ORDER — SIMVASTATIN 20 MG PO TABS: 10.0000 mg | ORAL_TABLET | Freq: Every day | ORAL | 1 refills | Status: DC

## 2019-03-03 ENCOUNTER — Telehealth: Payer: Self-pay | Admitting: Oncology

## 2019-03-03 NOTE — Telephone Encounter (Signed)
Called patient regarding upcoming Webex appointment, patient would prefer this to be a telephone visit.

## 2019-03-04 ENCOUNTER — Other Ambulatory Visit: Payer: Self-pay

## 2019-03-04 ENCOUNTER — Inpatient Hospital Stay: Payer: No Typology Code available for payment source | Attending: Oncology | Admitting: Oncology

## 2019-03-04 ENCOUNTER — Inpatient Hospital Stay: Payer: No Typology Code available for payment source

## 2019-03-04 ENCOUNTER — Telehealth: Payer: Self-pay | Admitting: Oncology

## 2019-03-04 DIAGNOSIS — Z794 Long term (current) use of insulin: Secondary | ICD-10-CM | POA: Diagnosis not present

## 2019-03-04 DIAGNOSIS — R159 Full incontinence of feces: Secondary | ICD-10-CM | POA: Diagnosis not present

## 2019-03-04 DIAGNOSIS — R32 Unspecified urinary incontinence: Secondary | ICD-10-CM | POA: Diagnosis not present

## 2019-03-04 DIAGNOSIS — C20 Malignant neoplasm of rectum: Secondary | ICD-10-CM | POA: Insufficient documentation

## 2019-03-04 DIAGNOSIS — Z86711 Personal history of pulmonary embolism: Secondary | ICD-10-CM | POA: Diagnosis not present

## 2019-03-04 DIAGNOSIS — E119 Type 2 diabetes mellitus without complications: Secondary | ICD-10-CM | POA: Insufficient documentation

## 2019-03-04 NOTE — Progress Notes (Signed)
  [facility] OFFICE VISIT PROGRESS NOTE  I connected with@ on 03/04/19 at 3:50 PM EDT by telephone and verified that I am speaking with the correct person using two identifiers.    Patient's location: Home Provider's location: Office   Diagnosis: Rectal cancer  INTERVAL HISTORY:   This was a telephone visit with [name].  She agreed to a phone visit in lieu of an in person visit.  This is secondary to the [employer] pandemic. [name] reports feeling well.  Good appetite.  No pain.  She continues to have frequent bowel movements and some incontinence.  She has episodes of rectal bleeding when she is having frequent bowel movements. She is now taking insulin for diabetes.  No new complaint.      Lab Results:  CEA-pending Medications: I have reviewed the patient's current medications.  Assessment/Plan: 1. Rectal cancer, stage III (T3 N1b), status post a low anterior resection 01/31/2011, status post radiation with continuous infusion 5-FU 03/19/2011 through 04/30/2011. Status post FOLFOX beginning 07/03/2011 for 8 cycles. Surveillance colonoscopy 02/19/2012-negative for recurrent cancer or polyps. Surveillance colonoscopy 04/15/2014-entire examined colon normal; terminal ileum appeared normal. Next colonoscopy recommended at a 5 year interval.  Surveillance CT scans negative 12/17/2013 2. Pulmonary embolism March 2012, status post IVC filter placement prior to cancer surgery in April 2012, followed by Dr. Chase Caller. Risk factors for pulmonary embolism felt to be related to obesity, oral contraceptive use, rectal cancer, and sedentary job. Now maintained off of anticoagulation  3. Removal of Port-A-Cath March 2014 4. Urinary/fecal incontinence. Initially improved with the pelvic physical therapy program.   Disposition: [name] is in clinical remission from rectal cancer.  She is now 8 years out from diagnosis.  We will  follow-up on the CEA from today.  She would like to be discharged from the medical oncology clinic.  She will continue clinical follow-up with Dr. Collene Mares and her primary physician.  She plans to schedule a colonoscopy with Dr. Collene Mares for within the next month. She reports benefiting from pelvic physical therapy in the past.  We will refer her again for physical therapy.  She will be discharged from the medical oncology clinic.  I am available to see her in the future as needed.  She has a good prognosis for long-term disease-free survival.     I discussed the assessment and treatment plan with the patient. The patient was provided an opportunity to ask questions and all were answered. The patient agreed with the plan and demonstrated an understanding of the instructions.   The patient was advised to call back or seek an in-person evaluation if the symptoms worsen or if the condition fails to improve as anticipated.  I provided 15 minutes of telephone/documentation time during this encounter, and > 50% was spent counseling as documented under my assessment & plan.  Betsy Coder ANP/GNP-BC   03/04/2019 3:58 PM

## 2019-03-04 NOTE — Telephone Encounter (Signed)
Per 5/14 los F/u TBA.

## 2019-03-05 LAB — CEA (IN HOUSE-CHCC): CEA (CHCC-In House): <1 ng/mL (ref 0.00–5.00)

## 2019-07-14 ENCOUNTER — Encounter: Payer: Self-pay | Admitting: Gynecology

## 2019-08-02 ENCOUNTER — Encounter: Payer: Self-pay | Admitting: Internal Medicine

## 2019-08-02 ENCOUNTER — Other Ambulatory Visit: Payer: Self-pay

## 2019-08-02 ENCOUNTER — Encounter: Payer: Self-pay | Admitting: Nurse Practitioner

## 2019-08-02 ENCOUNTER — Telehealth (INDEPENDENT_AMBULATORY_CARE_PROVIDER_SITE_OTHER): Payer: No Typology Code available for payment source | Admitting: Nurse Practitioner

## 2019-08-02 VITALS — Ht 71.0 in | Wt 210.0 lb

## 2019-08-02 DIAGNOSIS — R0989 Other specified symptoms and signs involving the circulatory and respiratory systems: Secondary | ICD-10-CM | POA: Diagnosis not present

## 2019-08-02 DIAGNOSIS — M791 Myalgia, unspecified site: Secondary | ICD-10-CM

## 2019-08-02 DIAGNOSIS — R531 Weakness: Secondary | ICD-10-CM

## 2019-08-02 DIAGNOSIS — R5381 Other malaise: Secondary | ICD-10-CM | POA: Diagnosis not present

## 2019-08-02 DIAGNOSIS — R509 Fever, unspecified: Secondary | ICD-10-CM

## 2019-08-02 NOTE — Progress Notes (Addendum)
Virtual Visit via Video   This visit type was conducted due to national recommendations for restrictions regarding the COVID-19 Pandemic (e.g. social distancing) in an effort to limit this patient's exposure and mitigate transmission in our community.  Due to her co-morbid illnesses, this patient is at least at moderate risk for complications without adequate follow up.  This format is felt to be most appropriate for this patient at this time.  All issues noted in this document were discussed and addressed.  A limited physical exam was performed with this format.    This visit type was conducted due to national recommendations for restrictions regarding the COVID-19 Pandemic (e.g. social distancing) in an effort to limit this patient's exposure and mitigate transmission in our community.  Patients identity confirmed using two different identifiers.  This format is felt to be most appropriate for this patient at this time.  All issues noted in this document were discussed and addressed.  No physical exam was performed (except for noted visual exam findings with Video Visits).    Date:  08/08/2019   ID:  [name], DOB 03-Sep-1966, MRN [id number]  Patient Location:  Home - spoke with [name]  Provider location:   Office    Chief Complaint:  Cold symptoms  History of Present Illness:    [name] is a 53 y.o. female who presents via video conferencing for a telehealth visit today.    The patient does have symptoms concerning for COVID-19 infection (fever, chills, cough, or new shortness of breath).   On Wednesday she was exhausted and weak, later that night had body aches and decreased appetite. She was checked on Friday.  Temp up to 100 at urgent.  She is working at home. Her son was sick first earlier that week.  She is starting to feel better today  She does report her blood sugar was up Today she was able to get out of the bed to do things, remained. Her husband has not  been sick.  She is seeing an [place] - increased her breakfast insulin 15 unit and 20 units at night.  Humulin 70/30.  Her last Hgba1c was 8.7.    URI  This is a new problem. The current episode started in the past 7 days. The problem has been unchanged. Pertinent negatives include no abdominal pain, coughing or sore throat.     Past Medical History:  Diagnosis Date  . 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  ECC with HPV effect  . Cancer of colon with rectum ([facility]) 2012  . Headache(784.0)    experiences migraine every couple of weeks. takes only excedrin  . 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  colposcopy adequate normal 10/2015   . Hyperlipidemia   . Pulmonary embolism ([facility])   . STD (sexually transmitted disease) 1991   POS GC-TREATED   Past Surgical History:  Procedure Laterality Date  . CESAREAN SECTION  2005  . COLON SURGERY    . INSERTION OF VENA CAVA FILTER Right 2012  . none    . PORT-A-CATH REMOVAL Right 12/22/2012   Procedure: REMOVAL PORT-A-CATH;  Surgeon: Odis Hollingshead, MD;  Location: [facility];  Service: General;  Laterality: Right;  . PORTACATH PLACEMENT  02/21/11   tip in mid svc-[place]  . RECTAL TUMOR BY PROCTOTOMY EXCISION  Current Meds  Medication Sig  . aspirin 81 MG tablet Take 81 mg by mouth daily.  Marland Kitchen aspirin-acetaminophen-caffeine (EXCEDRIN MIGRAINE) 250-250-65 MG per tablet Take 2 tablets by mouth every 6 (six) hours as needed. For migraines  . cholecalciferol (VITAMIN D) 400 UNITS TABS tablet Take 400 Units by mouth.  . cyanocobalamin 500 MCG tablet Take 500 mcg by mouth daily.  . Insulin Lispro (HUMALOG [place]) Inject into the skin 2 (two) times daily.  Marland Kitchen levothyroxine (SYNTHROID, LEVOTHROID) 100 MCG tablet   . Loperamide HCl (IMODIUM PO) Take by mouth.  . Multiple Vitamin  (MULTIVITAMIN) tablet Take 1 tablet by mouth daily.  . simvastatin (ZOCOR) 20 MG tablet Take 0.5 tablets (10 mg total) by mouth daily.     Allergies:   Metformin and related and Penicillins   Social History   Tobacco Use  . Smoking status: Never Smoker  . Smokeless tobacco: Never Used  Substance Use Topics  . Alcohol use: Yes    Comment: rare- twice a year  . Drug use: No     Family Hx: The patient's family history includes Brain cancer in her mother; Cervical cancer in her paternal grandmother; Diabetes in her brother and mother; Heart disease in her father; Hypertension in her mother; Kidney disease in her brother; Lung cancer in her mother. There is no history of Breast cancer.  ROS:   Please see the history of present illness.    Review of Systems  Constitutional: Positive for malaise/fatigue.  HENT: Negative for sore throat.   Respiratory: Negative for cough.   Cardiovascular: Negative.   Gastrointestinal: Negative for abdominal pain.  Neurological: Negative.     All other systems reviewed and are negative.   Labs/Other Tests and Data Reviewed:    Recent Labs: No results found for requested labs within last 8760 hours.   Recent Lipid Panel Lab Results  Component Value Date/Time   CHOL 244 (H) 12/03/2018 11:28 AM   TRIG 130 12/03/2018 11:28 AM   HDL 52 12/03/2018 11:28 AM   CHOLHDL 4.7 (H) 12/03/2018 11:28 AM   CHOLHDL 3.9 10/27/2015 09:04 AM   LDLCALC 166 (H) 12/03/2018 11:28 AM    Wt Readings from Last 3 Encounters:  08/02/19 210 lb (95.3 kg)  12/03/18 216 lb 6.4 oz (98.2 kg)  10/26/18 215 lb (97.5 kg)     Exam:    Vital Signs:  Ht 5\' 11"  (1.803 m)   Wt 210 lb (95.3 kg)   LMP 09/12/2011   BMI 29.29 kg/m     Physical Exam  Constitutional: She is oriented to person, place, and time and well-developed, well-nourished, and in no distress. No distress.  Neurological: She is alert and oriented to person, place, and time.  Psychiatric: Mood, memory,  affect and judgment normal.    ASSESSMENT & PLAN:    1. Upper respiratory symptom  She had a 4 day history of viral illness symptoms  She is doing better today  Will not check for coronavirus due to improving.  If symptoms worsen return call to office or go to ER for further evaluation  COVID-19 Education: The signs and symptoms of COVID-19 were discussed with the patient and how to seek care for testing (follow up with PCP or arrange E-visit).  The importance of social distancing was discussed today.  Patient Risk:   After full review of this patients clinical status, I feel that they are at least moderate risk at this time.  Time:   Today, I  have spent 12 minutes/ seconds with the patient with telehealth technology discussing above diagnoses.     Medication Adjustments/Labs and Tests Ordered: Current medicines are reviewed at length with the patient today.  Concerns regarding medicines are outlined above.   Tests Ordered: No orders of the defined types were placed in this encounter.   Medication Changes: No orders of the defined types were placed in this encounter.   Disposition:  Follow up prn  Signed, [name], FNP

## 2019-09-15 LAB — HM DIABETES EYE EXAM

## 2019-09-20 ENCOUNTER — Encounter: Payer: Self-pay | Admitting: Nurse Practitioner

## 2020-01-03 ENCOUNTER — Other Ambulatory Visit: Payer: Self-pay | Admitting: Internal Medicine

## 2020-01-03 ENCOUNTER — Telehealth: Payer: Self-pay | Admitting: *Deleted

## 2020-01-03 DIAGNOSIS — Z1231 Encounter for screening mammogram for malignant neoplasm of breast: Secondary | ICD-10-CM

## 2020-01-03 NOTE — Telephone Encounter (Signed)
Patient called the breast center to schedule her yearly mammogram screening was asked if any breast problems from the staff. Patient told the breast center she has noticed a slight pain under left armpit, breast center staff told patient she needs to call the office. I explained she needs to have a office visit for exam, placed patient on hold to have appointment's schedule , but patient hung up.

## 2020-01-17 ENCOUNTER — Other Ambulatory Visit: Payer: Self-pay

## 2020-01-18 ENCOUNTER — Ambulatory Visit (INDEPENDENT_AMBULATORY_CARE_PROVIDER_SITE_OTHER): Payer: No Typology Code available for payment source | Admitting: Women's Health

## 2020-01-18 ENCOUNTER — Encounter: Payer: Self-pay | Admitting: Women's Health

## 2020-01-18 VITALS — BP 120/80

## 2020-01-18 DIAGNOSIS — N644 Mastodynia: Secondary | ICD-10-CM | POA: Diagnosis not present

## 2020-01-18 MED ORDER — IBUPROFEN 600 MG PO TABS: 600.0000 mg | ORAL_TABLET | Freq: Three times a day (TID) | ORAL | 1 refills | Status: DC | PRN

## 2020-01-18 NOTE — Progress Notes (Signed)
54 year old M BF G6, P3 presents with complaint of left breast upper outer quadrant tenderness without palpable masses.  Discomfort has been present for months also having intermittent left neck pain radiating to the shoulder and down the arm.  Denies injury, change in routine or exercise.  Questions if it is more muscular in nature, left-handed.  States only reason he came in for office visit is because when scheduling annual mammogram mentioned was having some tenderness in the left breast and was told needed to see gynecologist.  Postmenopausal on no HRT, LMP 2012.  Normal mammogram history. 3/ 2012 bilateral PEs.  01/2011 rectal/ colon cancer had surgery, chemotherapy and radiation continues to have some rectal incontinence.  Negative genetic testing.  03/2019 - colonoscopy.  Primary care manages diabetes, hypothyroidism, and hypercholesteremia.    Exam: Appears well.  Breast examined sitting and lying position without retractions, dimpling, nipple discharge, erythema or palpable masses/nodules.  Tenderness present in upper outer left breast radiating into upper chest and neck area.  Left breast mastodynia/muscular in nature  Plan: Proceed with annual 3D screening mammogram.  Reviewed it appears to be more muscular in nature reviewed normality of breast exam.  Motrin 600 mg every 8 hours as needed for pain, encouraged a massage, yoga for stretching may help.

## 2020-02-14 ENCOUNTER — Other Ambulatory Visit: Payer: Self-pay

## 2020-02-15 ENCOUNTER — Encounter: Payer: Self-pay | Admitting: Women's Health

## 2020-02-15 ENCOUNTER — Ambulatory Visit (INDEPENDENT_AMBULATORY_CARE_PROVIDER_SITE_OTHER): Payer: No Typology Code available for payment source | Admitting: Women's Health

## 2020-02-15 VITALS — BP 132/80 | Ht 71.0 in | Wt 201.0 lb

## 2020-02-15 DIAGNOSIS — Z01419 Encounter for gynecological examination (general) (routine) without abnormal findings: Secondary | ICD-10-CM | POA: Diagnosis not present

## 2020-02-15 NOTE — Patient Instructions (Addendum)
Mammogram [phone]   Vit D 2000 iu daily Pleasure knowing you!  Health Maintenance for Postmenopausal Women Menopause is a normal process in which your ability to get pregnant comes to an end. This process happens slowly over many months or years, usually between the ages of 52 and 18. Menopause is complete when you have missed your menstrual periods for 12 months. It is important to talk with your health care provider about some of the most common conditions that affect women after menopause (postmenopausal women). These include heart disease, cancer, and bone loss (osteoporosis). Adopting a healthy lifestyle and getting preventive care can help to promote your health and wellness. The actions you take can also lower your chances of developing some of these common conditions. What should I know about menopause? During menopause, you may get a number of symptoms, such as:  Hot flashes. These can be moderate or severe.  Night sweats.  Decrease in sex drive.  Mood swings.  Headaches.  Tiredness.  Irritability.  Memory problems.  Insomnia. Choosing to treat or not to treat these symptoms is a decision that you make with your health care provider. Do I need hormone replacement therapy?  Hormone replacement therapy is effective in treating symptoms that are caused by menopause, such as hot flashes and night sweats.  Hormone replacement carries certain risks, especially as you become older. If you are thinking about using estrogen or estrogen with progestin, discuss the benefits and risks with your health care provider. What is my risk for heart disease and stroke? The risk of heart disease, heart attack, and stroke increases as you age. One of the causes may be a change in the body's hormones during menopause. This can affect how your body uses dietary fats, triglycerides, and cholesterol. Heart attack and stroke are medical emergencies. There are many things that you can do to help  prevent heart disease and stroke. Watch your blood pressure  High blood pressure causes heart disease and increases the risk of stroke. This is more likely to develop in people who have high blood pressure readings, are of African descent, or are overweight.  Have your blood pressure checked: ? Every 3-5 years if you are 48-48 years of age. ? Every year if you are 30 years old or older. Eat a healthy diet   Eat a diet that includes plenty of vegetables, fruits, low-fat dairy products, and lean protein.  Do not eat a lot of foods that are high in solid fats, added sugars, or sodium. Get regular exercise Get regular exercise. This is one of the most important things you can do for your health. Most adults should:  Try to exercise for at least 150 minutes each week. The exercise should increase your heart rate and make you sweat (moderate-intensity exercise).  Try to do strengthening exercises at least twice each week. Do these in addition to the moderate-intensity exercise.  Spend less time sitting. Even light physical activity can be beneficial. Other tips  Work with your health care provider to achieve or maintain a healthy weight.  Do not use any products that contain nicotine or tobacco, such as cigarettes, e-cigarettes, and chewing tobacco. If you need help quitting, ask your health care provider.  Know your numbers. Ask your health care provider to check your cholesterol and your blood sugar (glucose). Continue to have your blood tested as directed by your health care provider. Do I need screening for cancer? Depending on your health history and family history, you  may need to have cancer screening at different stages of your life. This may include screening for:  Breast cancer.  Cervical cancer.  Lung cancer.  Colorectal cancer. What is my risk for osteoporosis? After menopause, you may be at increased risk for osteoporosis. Osteoporosis is a condition in which bone  destruction happens more quickly than new bone creation. To help prevent osteoporosis or the bone fractures that can happen because of osteoporosis, you may take the following actions:  If you are 19-50 years old, get at least 1,000 mg of calcium and at least 600 mg of vitamin D per day.  If you are older than age 50 but younger than age 70, get at least 1,200 mg of calcium and at least 600 mg of vitamin D per day.  If you are older than age 70, get at least 1,200 mg of calcium and at least 800 mg of vitamin D per day. Smoking and drinking excessive alcohol increase the risk of osteoporosis. Eat foods that are rich in calcium and vitamin D, and do weight-bearing exercises several times each week as directed by your health care provider. How does menopause affect my mental health? Depression may occur at any age, but it is more common as you become older. Common symptoms of depression include:  Low or sad mood.  Changes in sleep patterns.  Changes in appetite or eating patterns.  Feeling an overall lack of motivation or enjoyment of activities that you previously enjoyed.  Frequent crying spells. Talk with your health care provider if you think that you are experiencing depression. General instructions See your health care provider for regular wellness exams and vaccines. This may include:  Scheduling regular health, dental, and eye exams.  Getting and maintaining your vaccines. These include: ? Influenza vaccine. Get this vaccine each year before the flu season begins. ? Pneumonia vaccine. ? Shingles vaccine. ? Tetanus, diphtheria, and pertussis (Tdap) booster vaccine. Your health care provider may also recommend other immunizations. Tell your health care provider if you have ever been abused or do not feel safe at home. Summary  Menopause is a normal process in which your ability to get pregnant comes to an end.  This condition causes hot flashes, night sweats, decreased  interest in sex, mood swings, headaches, or lack of sleep.  Treatment for this condition may include hormone replacement therapy.  Take actions to keep yourself healthy, including exercising regularly, eating a healthy diet, watching your weight, and checking your blood pressure and blood sugar levels.  Get screened for cancer and depression. Make sure that you are up to date with all your vaccines. This information is not intended to replace advice given to you by your health care provider. Make sure you discuss any questions you have with your health care provider. Document Revised: 09/30/2018 Document Reviewed: 09/30/2018 Elsevier Patient Education  2020 Elsevier Inc.  

## 2020-02-15 NOTE — Progress Notes (Signed)
   [name] 1965-12-24 [id number]   History:  54 y.o. G6P3 presents for annual exam.  Postmenopausal on no HRT with no bleeding.  2020 Pap ASCUS with positive high risk HPV colposcopy LGSIL.  Normal mammogram history overdue.  03/2019 colonoscopy benign colon polyp 5-year follow-up.  2012 pulmonary embolism shortly thereafter diagnosis of rectal cancer that surgery continues to have some incontinence has had physical therapy in the past.  Medical problems include diabetes on insulin, hypercholesteremia, hypothyroidism, hypertension.  Gynecologic History Patient's last menstrual period was 09/12/2011.   past medical history, past surgical history, family history and social history were all reviewed and documented in the EPIC chart.  Works a Designer, [employer].  Oldest son 56 type 1 diabetes since age 14 doing better with self-care but still struggles.  Other 2 sons doing well youngest is 37 has had Gardasil.  Mother deceased from lung cancer.  ROS:  A ROS was performed and pertinent positives and negatives are included.  Exam:  Vitals:   02/15/20 0959  BP: 132/80  Weight: 201 lb (91.2 kg)  Height: 5\' 11"  (1.803 m)   Body mass index is 28.03 kg/m.  General appearance:  Normal Thyroid:  Symmetrical, normal in size, without palpable masses or nodularity. Respiratory  Auscultation:  Clear without wheezing or rhonchi Cardiovascular  Auscultation:  Regular rate, without rubs, murmurs or gallops  Edema/varicosities:  Not grossly evident Abdominal  Soft,nontender, without masses, guarding or rebound.  Liver/spleen:  No organomegaly noted  Hernia:  None appreciated  Skin  Inspection:  Grossly normal   Breasts: Examined lying and sitting.   Right: Without masses, retractions, discharge or axillary adenopathy.   Left: Without masses, retractions, discharge or axillary adenopathy. Gentitourinary   Inguinal/mons:  Normal without inguinal adenopathy  External genitalia:   Normal  BUS/Urethra/Skene's glands:  Normal  Vagina:  Normal  Cervix:  Normal  Uterus:  normal in size, shape and contour.  Midline and mobile  Adnexa/parametria:     Rt: Without masses or tenderness.   Lt: Without masses or tenderness.  Anus and perineum: Normal  Digital rectal exam: Normal sphincter tone without palpated masses or tenderness  Assessment/Plan:  54 y.o.MBF G6P3  for annual exam with no complaints.  Postmenopausal on no HRT with no bleeding. 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  Aware to not use HRT.  SBEs, overdue for mammogram breast center information given instructed to schedule.  Congratulated on 14 pound weight loss with diet and exercise continue healthy lifestyle.  Pap with HR HPV typing, will triaged based on results.      [place], 11:18 AM 02/15/2020

## 2020-02-16 LAB — URINALYSIS, COMPLETE W/RFL CULTURE
Bacteria, UA: NONE SEEN /HPF
Bilirubin Urine: NEGATIVE
Hgb urine dipstick: NEGATIVE
Hyaline Cast: NONE SEEN /LPF
Ketones, ur: NEGATIVE
Leukocyte Esterase: NEGATIVE
Nitrites, Initial: NEGATIVE
Protein, ur: NEGATIVE
RBC / HPF: NONE SEEN /HPF (ref 0–2)
Specific Gravity, Urine: 1.043 — ABNORMAL HIGH (ref 1.001–1.03)
Squamous Epithelial / LPF: NONE SEEN /HPF (ref <=5)
WBC, UA: NONE SEEN /HPF (ref 0–5)
pH: <=5 (ref 5.0–8.0)

## 2020-02-16 LAB — NO CULTURE INDICATED

## 2020-02-23 LAB — PAP, TP IMAGING W/ HPV RNA, RFLX HPV TYPE 16,18/45: HPV DNA High Risk: DETECTED — AB

## 2020-02-23 LAB — HPV TYPE 16 AND 18/45 RNA
HPV Type 16 RNA: NOT DETECTED
HPV Type 18/45 RNA: NOT DETECTED

## 2020-03-10 ENCOUNTER — Ambulatory Visit (HOSPITAL_COMMUNITY)
Admission: EM | Admit: 2020-03-10 | Discharge: 2020-03-10 | Disposition: A | Discharge status: Home / Self Care | Payer: No Typology Code available for payment source | Attending: Emergency Medicine | Admitting: Emergency Medicine

## 2020-03-10 ENCOUNTER — Encounter (HOSPITAL_COMMUNITY): Payer: Self-pay

## 2020-03-10 ENCOUNTER — Other Ambulatory Visit: Payer: Self-pay

## 2020-03-10 DIAGNOSIS — Z8616 Personal history of COVID-19: Secondary | ICD-10-CM

## 2020-03-10 DIAGNOSIS — J069 Acute upper respiratory infection, unspecified: Secondary | ICD-10-CM | POA: Diagnosis not present

## 2020-03-10 DIAGNOSIS — U071 COVID-19: Secondary | ICD-10-CM | POA: Diagnosis not present

## 2020-03-10 DIAGNOSIS — R110 Nausea: Secondary | ICD-10-CM

## 2020-03-10 DIAGNOSIS — Z20822 Contact with and (suspected) exposure to covid-19: Secondary | ICD-10-CM | POA: Diagnosis not present

## 2020-03-10 HISTORY — DX: Type 2 diabetes mellitus without complications: E11.9

## 2020-03-10 HISTORY — DX: Personal history of COVID-19: Z86.16

## 2020-03-10 HISTORY — DX: Disorder of thyroid, unspecified: E07.9

## 2020-03-10 MED ORDER — PREDNISONE 10 MG PO TABS
20.0000 mg | ORAL_TABLET | Freq: Every day | ORAL | 0 refills | Status: AC
Start: 2020-03-10 — End: 2020-03-15

## 2020-03-10 MED ORDER — ONDANSETRON HCL 4 MG PO TABS: 4.0000 mg | ORAL_TABLET | Freq: Three times a day (TID) | ORAL | 0 refills | Status: DC | PRN

## 2020-03-10 MED ORDER — BENZONATATE 100 MG PO CAPS
100.0000 mg | ORAL_CAPSULE | Freq: Three times a day (TID) | ORAL | 0 refills | Status: DC
Start: 2020-03-10 — End: 2020-04-19

## 2020-03-10 NOTE — Discharge Instructions (Addendum)
COVID testing ordered.  It will take between 1-7 days for test results.  Someone will contact you regarding abnormal results.    In the meantime: You should remain isolated in your home for 10 days from symptom onset AND greater than 24 hours after symptoms resolution (absence of fever without the use of fever-reducing medication and improvement in respiratory symptoms), whichever is longer Get plenty of rest and push fluids Tessalon Perles prescribed for cough Zofran prescribed for nausea 5 days course of prednisone was prescribed Use medications daily for symptom relief Use OTC medications like ibuprofen or tylenol as needed fever or pain Call or go to the ED if you have any new or worsening symptoms such as fever, worsening cough, shortness of breath, chest tightness, chest pain, turning blue, changes in mental status, etc..Marland Kitchen

## 2020-03-10 NOTE — ED Provider Notes (Signed)
[facility]    CSN: [id number] Arrival date & time: 03/10/20  1031      History   Chief Complaint Chief Complaint  Patient presents with  . Cough  . Fatigue    HPI [name] is a 54 y.o. female.   Who presented to the urgent care for complaint of cough, fatigue and nausea for the past 1 week.  Reports she was exposed to COVID-19 2 weeks ago.  Denies sick exposure to COVID, flu or strep.  Denies recent travel.  Denies aggravating or alleviating symptoms.  Denies previous COVID infection.   Denies fever, chills,  nasal congestion, rhinorrhea, sore throat, , SOB, wheezing, chest pain, nausea, vomiting, changes in bowel or bladder habits.    The history is provided by the patient. No language interpreter was used.    Past Medical History:  Diagnosis Date  . 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  ECC with HPV effect  . Cancer of colon with rectum ([place]) 2012  . Diabetes mellitus without complication ([place])   . Headache(784.0)    experiences migraine every couple of weeks. takes only excedrin  . 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  colposcopy adequate normal 10/2015   . Hyperlipidemia   . Pulmonary embolism ([place])   . STD (sexually transmitted disease) 1991   POS GC-TREATED  . Thyroid disease     Patient Active Problem List   Diagnosis Date Noted  . Type 2 diabetes mellitus without complication, without long-term current use of insulin ([place]) 12/03/2018  . Hypothyroidism 07/08/2018  . Bilateral calf pain 06/08/2015  . Chest pain 08/22/2014  . Hyperlipidemia 07/13/2014  . Family history of hypertrophic cardiomyopathy 07/13/2014  . DOE (dyspnea on exertion) 07/13/2014  . Diabetes ([place]) 11/01/2013  . Adiposity 11/01/2013  . Bilateral pulmonary embolism ([place]) 11/01/2013  . Obesity 11/01/2013  . Diabetes mellitus   .  Malignant neoplasm of rectum ([place]) 08/29/2011    Past Surgical History:  Procedure Laterality Date  . CESAREAN SECTION  2005  . COLON SURGERY    . INSERTION OF VENA CAVA FILTER Right 2012  . none    . PORT-A-CATH REMOVAL Right 12/22/2012   Procedure: REMOVAL PORT-A-CATH;  Surgeon: Odis Hollingshead, MD;  Location: [facility];  Service: General;  Laterality: Right;  . PORTACATH PLACEMENT  02/21/11   tip in mid [id number]  . RECTAL TUMOR BY PROCTOTOMY EXCISION      OB History    Gravida  6   Para  3   Term      Preterm      AB  3   Living  3     SAB      TAB      Ectopic      Multiple      Live Births               Home Medications    Prior to Admission medications   Medication Sig Start Date End Date Taking? Authorizing Provider  aspirin 81 MG tablet Take 81 mg by mouth daily.    [provider]  aspirin-acetaminophen-caffeine (EXCEDRIN MIGRAINE) [phone] MG per tablet Take 2 tablets by mouth every 6 (six) hours as needed. For migraines    [provider]  benzonatate (TESSALON) 100 MG capsule Take 1 capsule (100 mg total) by  mouth every 8 (eight) hours. 03/10/20   Zyion Leidner, Darrelyn Hillock, FNP  cholecalciferol (VITAMIN D) 400 UNITS TABS tablet Take 400 Units by mouth.    [provider]  cyanocobalamin 500 MCG tablet Take 500 mcg by mouth daily.    [provider]  ibuprofen (ADVIL) 600 MG tablet Take 1 tablet (600 mg total) by mouth every 8 (eight) hours as needed. 01/18/20   [name], NP  Insulin Lispro (HUMALOG ) Inject into the skin 2 (two) times daily.    [provider]  JARDIANCE 25 MG TABS tablet Take 25 mg by mouth daily. 01/01/20   [provider]  levothyroxine (SYNTHROID, LEVOTHROID) 100 MCG tablet  07/13/15   [provider]  Loperamide HCl (IMODIUM PO) Take by mouth.    [provider]  Multiple Vitamin (MULTIVITAMIN) tablet Take 1 tablet by mouth daily.     [provider]  ondansetron (ZOFRAN) 4 MG tablet Take 1 tablet (4 mg total) by mouth every 8 (eight) hours as needed for nausea or vomiting. 03/10/20   Li Bobo, Darrelyn Hillock, FNP  predniSONE (DELTASONE) 10 MG tablet Take 2 tablets (20 mg total) by mouth daily for 5 days. 03/10/20 03/15/20  Allex Madia, Darrelyn Hillock, FNP  pyridOXINE (VITAMIN B-6) 100 MG tablet Take 100 mg by mouth 2 (two) times daily.    [provider]  simvastatin (ZOCOR) 20 MG tablet Take 0.5 tablets (10 mg total) by mouth daily. 12/04/18   [name], FNP  POTASSIUM CHLORIDE PO Take 20 mEq by mouth daily.   12/31/11  [provider]    Family History Family History  Problem Relation Age of Onset  . Heart disease Father   . Lung cancer Mother   . Brain cancer Mother   . Diabetes Mother   . Hypertension Mother   . Diabetes Brother   . Kidney disease Brother   . Cervical cancer Paternal Grandmother   . Breast cancer Neg Hx     Social History Social History   Tobacco Use  . Smoking status: Never Smoker  . Smokeless tobacco: Never Used  Substance Use Topics  . Alcohol use: Yes    Comment: rare- twice a year  . Drug use: No     Allergies   Metformin and related and Penicillins   Review of Systems Review of Systems  Constitutional: Positive for fatigue and fever.  HENT: Negative.   Respiratory: Positive for cough.   Cardiovascular: Negative.   Gastrointestinal: Positive for nausea.  Neurological: Negative.      Physical Exam Triage Vital Signs ED Triage Vitals  Enc Vitals Group     BP      Pulse      Resp      Temp      Temp src      SpO2      Weight      Height      Head Circumference      Peak Flow      Pain Score      Pain Loc      Pain Edu?      Excl. in [place]?    No data found.  Updated Vital Signs BP 131/80 (BP Location: Right Arm)   Pulse 88   Temp 99 F (37.2 C) (Oral)   Resp 16   LMP 09/12/2011   SpO2 99%   Visual Acuity Right Eye Distance:   Left Eye  Distance:   Bilateral Distance:  Right Eye Near:   Left Eye Near:    Bilateral Near:     Physical Exam Vitals and nursing note reviewed.  Constitutional:      General: She is not in acute distress.    Appearance: Normal appearance. She is normal weight. She is not ill-appearing or toxic-appearing.  HENT:     Head: Normocephalic.     Right Ear: Tympanic membrane, ear canal and external ear normal. There is no impacted cerumen.     Left Ear: Tympanic membrane, ear canal and external ear normal. There is no impacted cerumen.     Nose: Nose normal. No congestion.     Mouth/Throat:     Mouth: Mucous membranes are moist.     Pharynx: Oropharynx is clear. No oropharyngeal exudate or posterior oropharyngeal erythema.  Cardiovascular:     Rate and Rhythm: Normal rate and regular rhythm.     Pulses: Normal pulses.     Heart sounds: Normal heart sounds. No murmur.  Pulmonary:     Effort: Pulmonary effort is normal. No respiratory distress.     Breath sounds: Normal breath sounds. No wheezing or rhonchi.  Chest:     Chest wall: No tenderness.  Abdominal:     General: Abdomen is flat. Bowel sounds are normal. There is no distension.     Palpations: There is no mass.     Tenderness: There is no abdominal tenderness.  Skin:    Capillary Refill: Capillary refill takes less than 2 seconds.  Neurological:     General: No focal deficit present.     Mental Status: She is alert and oriented to person, place, and time.      UC Treatments / Results  Labs (all labs ordered are listed, but only abnormal results are displayed) Labs Reviewed  SARS CORONAVIRUS 2 (TAT 6-24 HRS)    EKG   Radiology No results found.  Procedures Procedures (including critical care time)  Medications Ordered in UC Medications - No data to display  Initial Impression / Assessment and Plan / UC Course  I have reviewed the triage vital signs and the nursing notes.  Pertinent labs & imaging results that  were available during my care of the patient were reviewed by me and considered in my medical decision making (see chart for details).    Patient is stable at discharge.  COVID-19 test was ordered and patient was advised to quarantine until results become available.  To go to ED for worsening symptoms.  Final Clinical Impressions(s) / UC Diagnoses   Final diagnoses:  Viral URI with cough  Nausea without vomiting  Exposure to COVID-19 virus     Discharge Instructions     COVID testing ordered.  It will take between 1-7 days for test results.  Someone will contact you regarding abnormal results.    In the meantime: You should remain isolated in your home for 10 days from symptom onset AND greater than 24 hours after symptoms resolution (absence of fever without the use of fever-reducing medication and improvement in respiratory symptoms), whichever is longer Get plenty of rest and push fluids Tessalon Perles prescribed for cough Zofran prescribed for nausea 5 days course of prednisone was prescribed Use medications daily for symptom relief Use OTC medications like ibuprofen or tylenol as needed fever or pain Call or go to the ED if you have any new or worsening symptoms such as fever, worsening cough, shortness of breath, chest tightness, chest pain, turning blue, changes in mental  status, etc...     ED Prescriptions    Medication Sig Dispense Auth. Provider   benzonatate (TESSALON) 100 MG capsule Take 1 capsule (100 mg total) by mouth every 8 (eight) hours. 30 capsule Jaspal Pultz S, FNP   predniSONE (DELTASONE) 10 MG tablet Take 2 tablets (20 mg total) by mouth daily for 5 days. 10 tablet Emmanual Gauthreaux S, FNP   ondansetron (ZOFRAN) 4 MG tablet Take 1 tablet (4 mg total) by mouth every 8 (eight) hours as needed for nausea or vomiting. 20 tablet Kiaria Quinnell, Darrelyn Hillock, FNP     PDMP not reviewed this encounter.   Emerson Monte, Dawn 03/10/20 1159

## 2020-03-10 NOTE — ED Triage Notes (Signed)
Pt presents with non productive cough and fatigue X 1 week.  Pt states he son tested positive for covid about 2 weeks ago.

## 2020-03-11 LAB — SARS CORONAVIRUS 2 (TAT 6-24 HRS): SARS Coronavirus 2: POSITIVE — AB

## 2020-03-12 ENCOUNTER — Other Ambulatory Visit: Payer: Self-pay | Admitting: Physician Assistant

## 2020-03-12 ENCOUNTER — Telehealth: Payer: Self-pay | Admitting: Physician Assistant

## 2020-03-12 ENCOUNTER — Encounter: Payer: Self-pay | Admitting: Physician Assistant

## 2020-03-12 NOTE — Telephone Encounter (Signed)
Called to discuss with [name] about Covid symptoms and the use of bamlanivimab/etesevimab or casirivimab/imdevimab, a monoclonal antibody infusion for those with mild to moderate Covid symptoms and at a high risk of hospitalization.     Pt is qualified for this infusion at the [facility] infusion center due to co-morbid conditions and/or a member of an at-risk group, however declines infusion at this time. Symptoms tier reviewed as well as criteria for ending isolation.  Symptoms reviewed that would warrant ED/Hospital evaluation. Preventative practices reviewed. Patient verbalized understanding. Patient advised to call back if he decides that he does want to get infusion. Callback number to the infusion center given. Patient advised to go to Urgent care or ED with severe symptoms. Last date pt would be eligible for infusion 5/26.   Patient Active Problem List   Diagnosis Date Noted  . Type 2 diabetes mellitus without complication, without long-term current use of insulin ([place]) 12/03/2018  . Hypothyroidism 07/08/2018  . Bilateral calf pain 06/08/2015  . Chest pain 08/22/2014  . Hyperlipidemia 07/13/2014  . Family history of hypertrophic cardiomyopathy 07/13/2014  . DOE (dyspnea on exertion) 07/13/2014  . Diabetes ([place]) 11/01/2013  . Adiposity 11/01/2013  . Bilateral pulmonary embolism ([place]) 11/01/2013  . Obesity 11/01/2013  . Diabetes mellitus   . Malignant neoplasm of rectum ([place]) 08/29/2011    Angelena Form PA-C

## 2020-03-13 ENCOUNTER — Telehealth: Payer: Self-pay

## 2020-03-13 NOTE — Telephone Encounter (Signed)
..   Pt understands that although there may be some limitations with this type of visit, we will take all precautions to reduce any security or privacy concerns.  Pt understands that this will be treated like an in office visit and we will file with pt's insurance, and there may be a patient responsible charge related to this service. ? ?

## 2020-03-14 ENCOUNTER — Encounter: Payer: Self-pay | Admitting: Internal Medicine

## 2020-03-14 ENCOUNTER — Telehealth (INDEPENDENT_AMBULATORY_CARE_PROVIDER_SITE_OTHER): Payer: No Typology Code available for payment source | Admitting: Nurse Practitioner

## 2020-03-14 ENCOUNTER — Other Ambulatory Visit: Payer: Self-pay

## 2020-03-14 ENCOUNTER — Encounter: Payer: Self-pay | Admitting: Nurse Practitioner

## 2020-03-14 VITALS — Temp 97.8°F

## 2020-03-14 DIAGNOSIS — R05 Cough: Secondary | ICD-10-CM | POA: Diagnosis not present

## 2020-03-14 DIAGNOSIS — B342 Coronavirus infection, unspecified: Secondary | ICD-10-CM

## 2020-03-14 DIAGNOSIS — R059 Cough, unspecified: Secondary | ICD-10-CM

## 2020-03-14 NOTE — Progress Notes (Signed)
Virtual Visit via Video   This visit type was conducted due to national recommendations for restrictions regarding the COVID-19 Pandemic (e.g. social distancing) in an effort to limit this patient's exposure and mitigate transmission in our community.  Due to her co-morbid illnesses, this patient is at least at moderate risk for complications without adequate follow up.  This format is felt to be most appropriate for this patient at this time.  All issues noted in this document were discussed and addressed.  A limited physical exam was performed with this format.    This visit type was conducted due to national recommendations for restrictions regarding the COVID-19 Pandemic (e.g. social distancing) in an effort to limit this patient's exposure and mitigate transmission in our community.  Patients identity confirmed using two different identifiers.  This format is felt to be most appropriate for this patient at this time.  All issues noted in this document were discussed and addressed.  No physical exam was performed (except for noted visual exam findings with Video Visits).    Date:  03/14/2020   ID:  [name], DOB [birth year], MRN [id number]  Patient Location:  Home - spoke with [name]  Provider location:   Office    Chief Complaint:    History of Present Illness:    [name] is a 54 y.o. female who presents via video conferencing for a telehealth visit today.    The patient does have symptoms concerning for COVID-19 infection (fever, chills, cough, or new shortness of breath).   Tested positive for covid on Friday, was not feeling better until today. Weakness and cough, fatigue and nausea on Thurs working from home. She continued to go to work.  By Thursday barely able to stand.  Her son was also sick with covid and strep.  Today is a better day out of the 2 weeks.  She was not able to stand in the shower for 60 seconds. Denies shortness of breath.  Blood sugars have  been low.    Cough This is a new problem. The current episode started 1 to 4 weeks ago. The problem has been gradually improving. The cough is non-productive. Pertinent negatives include no chest pain or fever. Nothing aggravates the symptoms. The treatment provided no relief.     Past Medical History:  Diagnosis Date  . 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  ECC with HPV effect  . Cancer of colon with rectum ([place]) 2012  . Diabetes mellitus without complication ([place])   . Headache(784.0)    experiences migraine every couple of weeks. takes only excedrin  . 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  colposcopy adequate normal 10/2015   . Hyperlipidemia   . Pulmonary embolism ([place])   . STD (sexually transmitted disease) 1991   POS GC-TREATED  . Thyroid disease    Past Surgical History:  Procedure Laterality Date  . CESAREAN SECTION  2005  . COLON SURGERY    . INSERTION OF VENA CAVA FILTER Right 2012  . none    . PORT-A-CATH REMOVAL Right 12/22/2012   Procedure: REMOVAL PORT-A-CATH;  Surgeon: Odis Hollingshead, MD;  Location: [facility];  Service: General;  Laterality: Right;  . PORTACATH PLACEMENT  02/21/11   tip in mid svc-rosenbower  . RECTAL TUMOR BY PROCTOTOMY EXCISION  Current Meds  Medication Sig  . aspirin 81 MG tablet Take 81 mg by mouth daily.  Marland Kitchen aspirin-acetaminophen-caffeine (EXCEDRIN MIGRAINE) 250-250-65 MG per tablet Take 2 tablets by mouth every 6 (six) hours as needed. For migraines  . benzonatate (TESSALON) 100 MG capsule Take 1 capsule (100 mg total) by mouth every 8 (eight) hours.  . cholecalciferol (VITAMIN D) 400 UNITS TABS tablet Take 400 Units by mouth.  . cyanocobalamin 500 MCG tablet Take 500 mcg by mouth daily.  Marland Kitchen ibuprofen (ADVIL) 600 MG tablet Take 1 tablet (600 mg total) by mouth every 8 (eight)  hours as needed.  . Insulin Lispro (HUMALOG [place]) Inject into the skin 2 (two) times daily.  Marland Kitchen JARDIANCE 25 MG TABS tablet Take 25 mg by mouth daily.  Marland Kitchen levothyroxine (SYNTHROID, LEVOTHROID) 100 MCG tablet   . Loperamide HCl (IMODIUM PO) Take by mouth.  . Multiple Vitamin (MULTIVITAMIN) tablet Take 1 tablet by mouth daily.  . ondansetron (ZOFRAN) 4 MG tablet Take 1 tablet (4 mg total) by mouth every 8 (eight) hours as needed for nausea or vomiting.  . predniSONE (DELTASONE) 10 MG tablet Take 2 tablets (20 mg total) by mouth daily for 5 days.  . simvastatin (ZOCOR) 20 MG tablet Take 0.5 tablets (10 mg total) by mouth daily.     Allergies:   Metformin and related and Penicillins   Social History   Tobacco Use  . Smoking status: Never Smoker  . Smokeless tobacco: Never Used  Substance Use Topics  . Alcohol use: Yes    Comment: rare- twice a year  . Drug use: No     Family Hx: The patient's family history includes Brain cancer in her mother; Cervical cancer in her paternal grandmother; Diabetes in her brother and mother; Heart disease in her father; Hypertension in her mother; Kidney disease in her brother; Lung cancer in her mother. There is no history of Breast cancer.  ROS:   Please see the history of present illness.    Review of Systems  Constitutional: Negative.  Negative for fever.  HENT: Negative.   Respiratory: Positive for cough.   Cardiovascular: Negative.  Negative for chest pain.  Gastrointestinal: Positive for nausea.  Neurological: Negative for dizziness and tingling.  Psychiatric/Behavioral: Negative.     All other systems reviewed and are negative.   Labs/Other Tests and Data Reviewed:    Recent Labs: No results found for requested labs within last 8760 hours.   Recent Lipid Panel Lab Results  Component Value Date/Time   CHOL 244 (H) 12/03/2018 11:28 AM   TRIG 130 12/03/2018 11:28 AM   HDL 52 12/03/2018 11:28 AM   CHOLHDL 4.7 (H) 12/03/2018 11:28 AM    CHOLHDL 3.9 10/27/2015 09:04 AM   LDLCALC 166 (H) 12/03/2018 11:28 AM    Wt Readings from Last 3 Encounters:  02/15/20 201 lb (91.2 kg)  08/02/19 210 lb (95.3 kg)  12/03/18 216 lb 6.4 oz (98.2 kg)     Exam:    Vital Signs:  Temp 97.8 F (36.6 C) (Oral)   LMP 09/12/2011     Physical Exam  Constitutional: She is oriented to person, place, and time and well-developed, well-nourished, and in no distress. No distress.  She is laying in the bed  Pulmonary/Chest: Effort normal. No respiratory distress.  Coughing during the visit  Neurological: She is alert and oriented to person, place, and time. No cranial nerve deficit.  Psychiatric: Mood, memory, affect and judgment normal.    ASSESSMENT &  PLAN:    1. Coronavirus infection  She is feeling better today and she is day 5 since being positive for covid.  Continues to be fatigued. Will keep her out of work until 6/1.  I will write an order for [facility] to make a visit with her.  Advised to stay well hydrated with water.    2. Cough  Continue with tessalon perles if shortness of breath or worsening symptoms advised to go to ER for for further evaluation.    COVID-19 Education: The signs and symptoms of COVID-19 were discussed with the patient and how to seek care for testing (follow up with PCP or arrange E-visit).  The importance of social distancing was discussed today.  Patient Risk:   After full review of this patients clinical status, I feel that they are at least moderate risk at this time.  Time:   Today, I have spent 12 minutes/ seconds with the patient with telehealth technology discussing above diagnoses.     Medication Adjustments/Labs and Tests Ordered: Current medicines are reviewed at length with the patient today.  Concerns regarding medicines are outlined above.   Tests Ordered: No orders of the defined types were placed in this encounter.   Medication Changes: No orders of the defined types were  placed in this encounter.   Disposition:  Follow up prn  Signed, [name], FNP

## 2020-03-21 ENCOUNTER — Emergency Department (HOSPITAL_COMMUNITY): Payer: No Typology Code available for payment source

## 2020-03-21 ENCOUNTER — Inpatient Hospital Stay (HOSPITAL_COMMUNITY)
Admission: EM | Admit: 2020-03-21 | Discharge: 2020-04-10 | DRG: 853 | Disposition: A | Discharge status: Home Health | Payer: No Typology Code available for payment source | Attending: Internal Medicine | Admitting: Internal Medicine

## 2020-03-21 DIAGNOSIS — Z0189 Encounter for other specified special examinations: Secondary | ICD-10-CM

## 2020-03-21 DIAGNOSIS — K219 Gastro-esophageal reflux disease without esophagitis: Secondary | ICD-10-CM | POA: Diagnosis present

## 2020-03-21 DIAGNOSIS — J1282 Pneumonia due to coronavirus disease 2019: Secondary | ICD-10-CM | POA: Diagnosis present

## 2020-03-21 DIAGNOSIS — Z794 Long term (current) use of insulin: Secondary | ICD-10-CM

## 2020-03-21 DIAGNOSIS — K56600 Partial intestinal obstruction, unspecified as to cause: Secondary | ICD-10-CM | POA: Diagnosis not present

## 2020-03-21 DIAGNOSIS — Z79890 Hormone replacement therapy (postmenopausal): Secondary | ICD-10-CM

## 2020-03-21 DIAGNOSIS — E1110 Type 2 diabetes mellitus with ketoacidosis without coma: Secondary | ICD-10-CM | POA: Diagnosis present

## 2020-03-21 DIAGNOSIS — E872 Acidosis, unspecified: Secondary | ICD-10-CM

## 2020-03-21 DIAGNOSIS — E875 Hyperkalemia: Secondary | ICD-10-CM | POA: Diagnosis present

## 2020-03-21 DIAGNOSIS — I82453 Acute embolism and thrombosis of peroneal vein, bilateral: Secondary | ICD-10-CM | POA: Diagnosis present

## 2020-03-21 DIAGNOSIS — J189 Pneumonia, unspecified organism: Secondary | ICD-10-CM

## 2020-03-21 DIAGNOSIS — K760 Fatty (change of) liver, not elsewhere classified: Secondary | ICD-10-CM | POA: Diagnosis present

## 2020-03-21 DIAGNOSIS — R140 Abdominal distension (gaseous): Secondary | ICD-10-CM

## 2020-03-21 DIAGNOSIS — A4189 Other specified sepsis: Secondary | ICD-10-CM | POA: Diagnosis not present

## 2020-03-21 DIAGNOSIS — K56609 Unspecified intestinal obstruction, unspecified as to partial versus complete obstruction: Secondary | ICD-10-CM

## 2020-03-21 DIAGNOSIS — U071 COVID-19: Secondary | ICD-10-CM | POA: Diagnosis present

## 2020-03-21 DIAGNOSIS — E039 Hypothyroidism, unspecified: Secondary | ICD-10-CM | POA: Diagnosis present

## 2020-03-21 DIAGNOSIS — E119 Type 2 diabetes mellitus without complications: Secondary | ICD-10-CM

## 2020-03-21 DIAGNOSIS — Z85038 Personal history of other malignant neoplasm of large intestine: Secondary | ICD-10-CM

## 2020-03-21 DIAGNOSIS — E785 Hyperlipidemia, unspecified: Secondary | ICD-10-CM | POA: Diagnosis present

## 2020-03-21 DIAGNOSIS — Y831 Surgical operation with implant of artificial internal device as the cause of abnormal reaction of the patient, or of later complication, without mention of misadventure at the time of the procedure: Secondary | ICD-10-CM | POA: Diagnosis present

## 2020-03-21 DIAGNOSIS — I82443 Acute embolism and thrombosis of tibial vein, bilateral: Secondary | ICD-10-CM | POA: Diagnosis present

## 2020-03-21 DIAGNOSIS — I82463 Acute embolism and thrombosis of calf muscular vein, bilateral: Secondary | ICD-10-CM | POA: Diagnosis present

## 2020-03-21 DIAGNOSIS — Z9049 Acquired absence of other specified parts of digestive tract: Secondary | ICD-10-CM

## 2020-03-21 DIAGNOSIS — Z79899 Other long term (current) drug therapy: Secondary | ICD-10-CM

## 2020-03-21 DIAGNOSIS — R197 Diarrhea, unspecified: Secondary | ICD-10-CM

## 2020-03-21 DIAGNOSIS — R6521 Severe sepsis with septic shock: Secondary | ICD-10-CM

## 2020-03-21 DIAGNOSIS — I82433 Acute embolism and thrombosis of popliteal vein, bilateral: Secondary | ICD-10-CM | POA: Diagnosis present

## 2020-03-21 DIAGNOSIS — T82868A Thrombosis of vascular prosthetic devices, implants and grafts, initial encounter: Secondary | ICD-10-CM | POA: Diagnosis present

## 2020-03-21 DIAGNOSIS — R29898 Other symptoms and signs involving the musculoskeletal system: Secondary | ICD-10-CM

## 2020-03-21 DIAGNOSIS — N179 Acute kidney failure, unspecified: Secondary | ICD-10-CM | POA: Diagnosis present

## 2020-03-21 DIAGNOSIS — R159 Full incontinence of feces: Secondary | ICD-10-CM | POA: Diagnosis present

## 2020-03-21 DIAGNOSIS — E876 Hypokalemia: Secondary | ICD-10-CM | POA: Diagnosis not present

## 2020-03-21 DIAGNOSIS — Z7982 Long term (current) use of aspirin: Secondary | ICD-10-CM

## 2020-03-21 DIAGNOSIS — D62 Acute posthemorrhagic anemia: Secondary | ICD-10-CM | POA: Diagnosis not present

## 2020-03-21 DIAGNOSIS — I82220 Acute embolism and thrombosis of inferior vena cava: Secondary | ICD-10-CM | POA: Diagnosis present

## 2020-03-21 DIAGNOSIS — D6959 Other secondary thrombocytopenia: Secondary | ICD-10-CM | POA: Diagnosis present

## 2020-03-21 DIAGNOSIS — M5114 Intervertebral disc disorders with radiculopathy, thoracic region: Secondary | ICD-10-CM | POA: Diagnosis present

## 2020-03-21 DIAGNOSIS — Z86711 Personal history of pulmonary embolism: Secondary | ICD-10-CM

## 2020-03-21 DIAGNOSIS — R571 Hypovolemic shock: Secondary | ICD-10-CM | POA: Diagnosis not present

## 2020-03-21 DIAGNOSIS — I82413 Acute embolism and thrombosis of femoral vein, bilateral: Secondary | ICD-10-CM | POA: Diagnosis present

## 2020-03-21 HISTORY — DX: Pneumonia, unspecified organism: J18.9

## 2020-03-21 LAB — CBC WITH DIFFERENTIAL/PLATELET
Abs Immature Granulocytes: 0.09 10*3/uL — ABNORMAL HIGH (ref 0.00–0.07)
Basophils Absolute: 0 10*3/uL (ref 0.0–0.1)
Basophils Relative: 0 %
Eosinophils Absolute: 0 10*3/uL (ref 0.0–0.5)
Eosinophils Relative: 0 %
HCT: 41.8 % (ref 36.0–46.0)
Hemoglobin: 13.3 g/dL (ref 12.0–15.0)
Immature Granulocytes: 1 %
Lymphocytes Relative: 9 %
Lymphs Abs: 1.3 10*3/uL (ref 0.7–4.0)
MCH: 29.7 pg (ref 26.0–34.0)
MCHC: 31.8 g/dL (ref 30.0–36.0)
MCV: 93.3 fL (ref 80.0–100.0)
Monocytes Absolute: 0.6 10*3/uL (ref 0.1–1.0)
Monocytes Relative: 4 %
Neutro Abs: 13.2 10*3/uL — ABNORMAL HIGH (ref 1.7–7.7)
Neutrophils Relative %: 86 %
Platelets: 270 10*3/uL (ref 150–400)
RBC: 4.48 MIL/uL (ref 3.87–5.11)
RDW: 13.2 % (ref 11.5–15.5)
WBC: 15.2 10*3/uL — ABNORMAL HIGH (ref 4.0–10.5)
nRBC: 0 % (ref 0.0–0.2)

## 2020-03-21 LAB — COMPREHENSIVE METABOLIC PANEL
ALT: 23 U/L (ref 0–44)
AST: 31 U/L (ref 15–41)
Albumin: 3.4 g/dL — ABNORMAL LOW (ref 3.5–5.0)
Alkaline Phosphatase: 74 U/L (ref 38–126)
Anion gap: 16 — ABNORMAL HIGH (ref 5–15)
BUN: 31 mg/dL — ABNORMAL HIGH (ref 6–20)
CO2: 15 mmol/L — ABNORMAL LOW (ref 22–32)
Calcium: 8.7 mg/dL — ABNORMAL LOW (ref 8.9–10.3)
Chloride: 105 mmol/L (ref 98–111)
Creatinine, Ser: 1.43 mg/dL — ABNORMAL HIGH (ref 0.44–1.00)
GFR calc Af Amer: 48 mL/min — ABNORMAL LOW (ref >60)
GFR calc non Af Amer: 42 mL/min — ABNORMAL LOW (ref >60)
Glucose, Bld: 368 mg/dL — ABNORMAL HIGH (ref 70–99)
Potassium: 5.9 mmol/L — ABNORMAL HIGH (ref 3.5–5.1)
Sodium: 136 mmol/L (ref 135–145)
Total Bilirubin: 1.8 mg/dL — ABNORMAL HIGH (ref 0.3–1.2)
Total Protein: 6.9 g/dL (ref 6.5–8.1)

## 2020-03-21 LAB — APTT: aPTT: 52 seconds — ABNORMAL HIGH (ref 24–36)

## 2020-03-21 LAB — POCT I-STAT EG7
Acid-base deficit: 9 mmol/L — ABNORMAL HIGH (ref 0.0–2.0)
Bicarbonate: 16.5 mmol/L — ABNORMAL LOW (ref 20.0–28.0)
Calcium, Ion: 1.1 mmol/L — ABNORMAL LOW (ref 1.15–1.40)
HCT: 40 % (ref 36.0–46.0)
Hemoglobin: 13.6 g/dL (ref 12.0–15.0)
O2 Saturation: 99 %
Potassium: 4.5 mmol/L (ref 3.5–5.1)
Sodium: 137 mmol/L (ref 135–145)
TCO2: 18 mmol/L — ABNORMAL LOW (ref 22–32)
pCO2, Ven: 33.3 mmHg — ABNORMAL LOW (ref 44.0–60.0)
pH, Ven: 7.303 (ref 7.250–7.430)
pO2, Ven: 126 mmHg — ABNORMAL HIGH (ref 32.0–45.0)

## 2020-03-21 LAB — TROPONIN I (HIGH SENSITIVITY): Troponin I (High Sensitivity): <2 ng/L (ref <18)

## 2020-03-21 LAB — D-DIMER, QUANTITATIVE: D-Dimer, Quant: >20 ug/mL-FEU — ABNORMAL HIGH (ref 0.00–0.50)

## 2020-03-21 LAB — I-STAT BETA HCG BLOOD, ED (MC, WL, AP ONLY): I-stat hCG, quantitative: <5 m[IU]/mL (ref <5)

## 2020-03-21 LAB — PROTIME-INR
INR: 1.5 — ABNORMAL HIGH (ref 0.8–1.2)
Prothrombin Time: 17.3 seconds — ABNORMAL HIGH (ref 11.4–15.2)

## 2020-03-21 LAB — BRAIN NATRIURETIC PEPTIDE: B Natriuretic Peptide: 29.9 pg/mL (ref 0.0–100.0)

## 2020-03-21 LAB — I-STAT CREATININE, ED: Creatinine, Ser: 1.2 mg/dL — ABNORMAL HIGH (ref 0.44–1.00)

## 2020-03-21 LAB — LACTIC ACID, PLASMA: Lactic Acid, Venous: 5.4 mmol/L (ref 0.5–1.9)

## 2020-03-21 MED ORDER — CEFTRIAXONE SODIUM 1 G IJ SOLR
1.0000 g | Freq: Once | INTRAMUSCULAR | Status: DC
  Filled 2020-03-21: qty 10

## 2020-03-21 MED ORDER — SODIUM CHLORIDE 0.9 % IV BOLUS
1000.0000 mL | Freq: Once | INTRAVENOUS | Status: AC
  Administered 2020-03-21: 1000 mL via INTRAVENOUS

## 2020-03-21 MED ORDER — KETAMINE HCL 50 MG/5ML IJ SOSY
20.0000 mg | PREFILLED_SYRINGE | Freq: Once | INTRAMUSCULAR | Status: AC
  Administered 2020-03-21: 20 mg via INTRAVENOUS
  Filled 2020-03-21: qty 5

## 2020-03-21 MED ORDER — IOHEXOL 350 MG/ML SOLN
75.0000 mL | Freq: Once | INTRAVENOUS | Status: AC | PRN
  Administered 2020-03-21: 75 mL via INTRAVENOUS

## 2020-03-21 MED ORDER — FENTANYL CITRATE (PF) 100 MCG/2ML IJ SOLN
25.0000 ug | Freq: Once | INTRAMUSCULAR | Status: AC
  Administered 2020-03-21: 25 ug via INTRAVENOUS
  Filled 2020-03-21: qty 2

## 2020-03-21 MED ORDER — SODIUM CHLORIDE 0.9 % IV BOLUS (SEPSIS)
2000.0000 mL | Freq: Once | INTRAVENOUS | Status: AC
  Administered 2020-03-21: 2000 mL via INTRAVENOUS

## 2020-03-21 MED ORDER — SODIUM CHLORIDE 0.9 % IV SOLN
500.0000 mg | Freq: Once | INTRAVENOUS | Status: AC
  Administered 2020-03-22: 500 mg via INTRAVENOUS
  Filled 2020-03-21: qty 500

## 2020-03-21 NOTE — ED Notes (Signed)
CBG 388

## 2020-03-21 NOTE — ED Provider Notes (Signed)
[facility] EMERGENCY DEPARTMENT Provider Note   CSN: [id number] Arrival date & time: 03/21/20  2120     History Chief Complaint  Patient presents with  . Hypotension  . Back Pain    MARAE BLAN is a 54 y.o. female.  54 yo F with a cc of low back pain.  Going on since yesterday.  No trauma.  No fevers, no iv drug use.  Had some urinary symptoms initally thought she had a uti.  She denies loss of bowel or bladder denies loss of peritoneal sensation denies recent surgery of her low back.  She does feel that both of her legs are subjectively numb.  Has a history of PE and DVT.  Recently diagnosed with the coronavirus about 4 weeks ago and has been recovering from that.  I reviewed the patient's chart she had a phone note from about 6 days ago where she was extremely weak and having difficulty standing even to take a shower.   The history is provided by the patient.  Back Pain Associated symptoms: dysuria, numbness and weakness (legs)   Associated symptoms: no chest pain, no fever and no headaches   Illness Severity:  Moderate Onset quality:  Gradual Duration:  2 days Timing:  Constant Progression:  Worsening Chronicity:  New Associated symptoms: myalgias   Associated symptoms: no chest pain, no congestion, no fever, no headaches, no nausea, no rhinorrhea, no shortness of breath, no vomiting and no wheezing        Past Medical History:  Diagnosis Date  . 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  ECC with HPV effect  . 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 Diabetes mellitus without complication ([place])   . Headache(784.0)    experiences migraine every couple of weeks. takes only excedrin  . 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  colposcopy adequate normal 10/2015   . Hyperlipidemia   . Pulmonary embolism ([place])     . STD (sexually transmitted disease) 1991   POS GC-TREATED  . Thyroid disease     Patient Active Problem List   Diagnosis Date Noted  . Type 2 diabetes mellitus without complication, without long-term current use of insulin ([place]) 12/03/2018  . Hypothyroidism 07/08/2018  . Bilateral calf pain 06/08/2015  . Chest pain 08/22/2014  . Hyperlipidemia 07/13/2014  . Family history of hypertrophic cardiomyopathy 07/13/2014  . DOE (dyspnea on exertion) 07/13/2014  . Diabetes ([place]) 11/01/2013  . Adiposity 11/01/2013  . Bilateral pulmonary embolism ([place]) 11/01/2013  . Obesity 11/01/2013  . Diabetes mellitus   . Malignant neoplasm of rectum ([place]) 08/29/2011    Past Surgical History:  Procedure Laterality Date  . CESAREAN SECTION  2005  . COLON SURGERY    . INSERTION OF VENA CAVA FILTER Right 2012  . none    . PORT-A-CATH REMOVAL Right 12/22/2012   Procedure: REMOVAL PORT-A-CATH;  Surgeon: Odis Hollingshead, MD;  Location: [facility];  Service: General;  Laterality: Right;  . PORTACATH PLACEMENT  02/21/11   tip in mid svc-rosenbower  . RECTAL TUMOR BY PROCTOTOMY EXCISION       OB History    Gravida  6   Para  3   Term      Preterm      AB  3   Living  3  SAB      TAB      Ectopic      Multiple      Live Births              Family History  Problem Relation Age of Onset  . Heart disease Father   . Lung cancer Mother   . Brain cancer Mother   . Diabetes Mother   . Hypertension Mother   . Diabetes Brother   . Kidney disease Brother   . Cervical cancer Paternal Grandmother   . Breast cancer Neg Hx     Social History   Tobacco Use  . Smoking status: Never Smoker  . Smokeless tobacco: Never Used  Substance Use Topics  . Alcohol use: Yes    Comment: rare- twice a year  . Drug use: No    Home Medications Prior to Admission medications   Medication Sig Start Date End Date Taking? Authorizing Provider  aspirin 81 MG tablet Take 81 mg  by mouth daily.    [provider]  aspirin-acetaminophen-caffeine (EXCEDRIN MIGRAINE) [phone] MG per tablet Take 2 tablets by mouth every 6 (six) hours as needed. For migraines    [provider]  benzonatate (TESSALON) 100 MG capsule Take 1 capsule (100 mg total) by mouth every 8 (eight) hours. 03/10/20   Avegno, Darrelyn Hillock, FNP  cholecalciferol (VITAMIN D) 400 UNITS TABS tablet Take 400 Units by mouth.    [provider]  cyanocobalamin 500 MCG tablet Take 500 mcg by mouth daily.    [provider]  ibuprofen (ADVIL) 600 MG tablet Take 1 tablet (600 mg total) by mouth every 8 (eight) hours as needed. 01/18/20   [name], NP  Insulin Lispro (HUMALOG [place]) Inject into the skin 2 (two) times daily.    [provider]  JARDIANCE 25 MG TABS tablet Take 25 mg by mouth daily. 01/01/20   [provider]  levothyroxine (SYNTHROID, LEVOTHROID) 100 MCG tablet  07/13/15   [provider]  Loperamide HCl (IMODIUM PO) Take by mouth.    [provider]  Multiple Vitamin (MULTIVITAMIN) tablet Take 1 tablet by mouth daily.    [provider]  ondansetron (ZOFRAN) 4 MG tablet Take 1 tablet (4 mg total) by mouth every 8 (eight) hours as needed for nausea or vomiting. 03/10/20   Avegno, Darrelyn Hillock, FNP  pyridOXINE (VITAMIN B-6) 100 MG tablet Take 100 mg by mouth 2 (two) times daily.    [provider]  simvastatin (ZOCOR) 20 MG tablet Take 0.5 tablets (10 mg total) by mouth daily. 12/04/18   [name], FNP  POTASSIUM CHLORIDE PO Take 20 mEq by mouth daily.   12/31/11  [provider]    Allergies    Metformin and related and Penicillins  Review of Systems   Review of Systems  Constitutional: Negative for chills and fever.  HENT: Negative for congestion and rhinorrhea.   Eyes: Negative for redness and visual disturbance.  Respiratory: Negative for shortness of breath and wheezing.   Cardiovascular: Negative  for chest pain and palpitations.  Gastrointestinal: Negative for nausea and vomiting.  Genitourinary: Positive for dysuria. Negative for urgency.  Musculoskeletal: Positive for back pain and myalgias. Negative for arthralgias.  Skin: Negative for pallor and wound.  Neurological: Positive for weakness (legs) and numbness. Negative for dizziness and headaches.    Physical Exam Updated Vital Signs BP 110/76   Pulse 87   Resp 11   LMP 09/12/2011  SpO2 100%   Physical Exam Vitals and nursing note reviewed.  Constitutional:      General: She is not in acute distress.    Appearance: She is well-developed. She is not diaphoretic.  HENT:     Head: Normocephalic and atraumatic.  Eyes:     Pupils: Pupils are equal, round, and reactive to light.  Cardiovascular:     Rate and Rhythm: Normal rate and regular rhythm.     Heart sounds: No murmur. No friction rub. No gallop.   Pulmonary:     Effort: Pulmonary effort is normal.     Breath sounds: No wheezing or rales.  Abdominal:     General: There is no distension.     Palpations: Abdomen is soft.     Tenderness: There is no abdominal tenderness.  Musculoskeletal:        General: Tenderness present.     Cervical back: Normal range of motion and neck supple.     Comments: Mild diffuse low back pain about the L spine L2-4.   No cva tenderness.  Cool extremities diffusely. Faint pulses in all 4 extremities.   Skin:    General: Skin is warm and dry.  Neurological:     Mental Status: She is alert and oriented to person, place, and time.  Psychiatric:        Behavior: Behavior normal.     ED Results / Procedures / Treatments   Labs (all labs ordered are listed, but only abnormal results are displayed) Labs Reviewed  LACTIC ACID, PLASMA - Abnormal; Notable for the following components:      Result Value   Lactic Acid, Venous 5.4 (*)    All other components within normal limits  COMPREHENSIVE METABOLIC PANEL - Abnormal; Notable  for the following components:   Potassium 5.9 (*)    CO2 15 (*)    Glucose, Bld 368 (*)    BUN 31 (*)    Creatinine, Ser 1.43 (*)    Calcium 8.7 (*)    Albumin 3.4 (*)    Total Bilirubin 1.8 (*)    GFR calc non Af Amer 42 (*)    GFR calc Af Amer 48 (*)    Anion gap 16 (*)    All other components within normal limits  CBC WITH DIFFERENTIAL/PLATELET - Abnormal; Notable for the following components:   WBC 15.2 (*)    Neutro Abs 13.2 (*)    Abs Immature Granulocytes 0.09 (*)    All other components within normal limits  APTT - Abnormal; Notable for the following components:   aPTT 52 (*)    All other components within normal limits  PROTIME-INR - Abnormal; Notable for the following components:   Prothrombin Time 17.3 (*)    INR 1.5 (*)    All other components within normal limits  CULTURE, BLOOD (ROUTINE X 2)  CULTURE, BLOOD (ROUTINE X 2)  URINE CULTURE  BRAIN NATRIURETIC PEPTIDE  URINALYSIS, ROUTINE W REFLEX MICROSCOPIC  D-DIMER, QUANTITATIVE (NOT AT [facility])  POTASSIUM  BETA-HYDROXYBUTYRIC ACID  I-STAT BETA HCG BLOOD, ED ([facility], [facility], [facility] ONLY)  I-STAT CREATININE, ED  CBG MONITORING, ED  TROPONIN I (HIGH SENSITIVITY)    EKG EKG Interpretation  Date/Time:  Tuesday March 21 2020 21:47:16 EDT Ventricular Rate:  80 PR Interval:    QRS Duration: 85 QT Interval:  431 QTC Calculation: 498 R Axis:   72 Text Interpretation: Sinus rhythm Borderline short PR interval Abnormal R-wave progression, early transition Borderline prolonged QT  interval No significant change since last tracing Confirmed by Deno Etienne [phone]) on 03/21/2020 10:03:48 PM   Radiology DG Chest Port 1 View  Result Date: 03/21/2020 CLINICAL DATA:  54 year old female with weakness and hypotension. EXAM: PORTABLE CHEST 1 VIEW COMPARISON:  Chest radiograph dated 03/25/2016 FINDINGS: Faint bilateral peripheral and subpleural densities, right greater left concerning for pneumonia, possibly atypical or viral in etiology  including COVID-19. Clinical correlation is recommended. No focal consolidation, pleural effusion, pneumothorax. The cardiac silhouette is within limits. No acute osseous pathology. IMPRESSION: Findings concerning for pneumonia. Electronically Signed   By: Anner Crete M.D.   On: 03/21/2020 22:17    Procedures Procedures (including critical care time) Procedure note: Ultrasound Guided Peripheral IV Ultrasound guided peripheral 1.88 inch angiocath IV placement performed by me. Indications: Nursing unable to place IV. Details: The antecubital fossa and upper arm were evaluated with a multifrequency linear probe. Patent brachial veins were noted. 1 attempt was made to cannulate a vein under realtime US guidance with successful cannulation of the vein and catheter placement. There is return of non-pulsatile dark red blood. The patient tolerated the procedure well without complications. Images archived electronically.  CPT codes: [phone] and [phone]  Medications Ordered in ED Medications  cefTRIAXone (ROCEPHIN) injection 1 g (has no administration in time range)  azithromycin (ZITHROMAX) 500 mg in sodium chloride 0.9 % 250 mL IVPB (has no administration in time range)  sodium chloride 0.9 % bolus 2,000 mL (2,000 mLs Intravenous New Bag/Given 03/21/20 2207)  fentaNYL (SUBLIMAZE) injection 25 mcg (25 mcg Intravenous Given 03/21/20 2215)  ketamine 50 mg in normal saline 5 mL (10 mg/mL) syringe (20 mg Intravenous Given 03/21/20 2333)  sodium chloride 0.9 % bolus 1,000 mL (1,000 mLs Intravenous New Bag/Given 03/21/20 2334)    ED Course  I have reviewed the triage vital signs and the nursing notes.  Pertinent labs & imaging results that were available during my care of the patient were reviewed by me and considered in my medical decision making (see chart for details).    MDM Rules/Calculators/A&P                      54 yo F with a cc of low back pain and weakness. Going on for less than 24 hours.   Started with urinary symptoms now feels like her legs are numb and weak.  She is hypertensive on arrival here with a blood pressure in the [id number] systolic.  Not obviously infectious based on her history we will give her 2 L of IV fluids check lab work.    Aki on labs, hyperglycemia.  Bicarb 15.   Patient having worsening back pain, removed both her iv's.  Replaced at bedside.   Patient's lactate is elevated at 5.4.  Will give antibiotics.  CT scan chest abdomen pelvis.  Signed out to Dr. Leonides Schanz, please see her note for further details of care in the ED.   CRITICAL CARE Performed by: Cecilio Asper   Total critical care time: 35 minutes  Critical care time was exclusive of separately billable procedures and treating other patients.  Critical care was necessary to treat or prevent imminent or life-threatening deterioration.  Critical care was time spent personally by me on the following activities: development of treatment plan with patient and/or surrogate as well as nursing, discussions with consultants, evaluation of patient's response to treatment, examination of patient, obtaining history from patient or surrogate, ordering and performing treatments and interventions, ordering  and review of laboratory studies, ordering and review of radiographic studies, pulse oximetry and re-evaluation of patient's condition.  The patients results and plan were reviewed and discussed.   Any x-rays performed were independently reviewed by myself.   Differential diagnosis were considered with the presenting HPI.  Medications  cefTRIAXone (ROCEPHIN) injection 1 g (has no administration in time range)  azithromycin (ZITHROMAX) 500 mg in sodium chloride 0.9 % 250 mL IVPB (has no administration in time range)  sodium chloride 0.9 % bolus 2,000 mL (2,000 mLs Intravenous New Bag/Given 03/21/20 2207)  fentaNYL (SUBLIMAZE) injection 25 mcg (25 mcg Intravenous Given 03/21/20 2215)  ketamine 50 mg in normal  saline 5 mL (10 mg/mL) syringe (20 mg Intravenous Given 03/21/20 2333)  sodium chloride 0.9 % bolus 1,000 mL (1,000 mLs Intravenous New Bag/Given 03/21/20 2334)    Vitals:   03/21/20 2227 03/21/20 2245 03/21/20 2330 03/21/20 2333  BP:  93/76  110/76  Pulse: 76 74 87   Resp: 14 15 19 11   SpO2: 100% 100% 100%     Final diagnoses:  Leg weakness, bilateral  AKI (acute kidney injury) ([place])  Lactic acid acidosis    Admission/ observation were discussed with the admitting physician, patient and/or family and they are comfortable with the plan.     Final Clinical Impression(s) / ED Diagnoses Final diagnoses:  Leg weakness, bilateral  AKI (acute kidney injury) ([place])  Lactic acid acidosis    Rx / DC Orders ED Discharge Orders    None       Deno Etienne, DO 03/21/20 2347

## 2020-03-21 NOTE — ED Triage Notes (Signed)
BIB [facility] after pt reported severe back pain, numbness and tingling to BIL LE after toileting and hearing a "pop" while standing up. EMS reports on arrival that pt was diaphoretic, hypotensive with a pressure in the 70's, and vomiting x 1. Upon arrival to hospital pt reports both severe abdominal an back pain, continued numbness and tingling sensation in BIL LE. Pt b/p 89/73, HR 84, 02 100% RA. Pt has hx DM Type 2. Pt tested pos. covid on 5/21.

## 2020-03-21 NOTE — ED Notes (Signed)
Reggie, husband, [phone] would like an update when available

## 2020-03-22 ENCOUNTER — Inpatient Hospital Stay (HOSPITAL_COMMUNITY): Payer: No Typology Code available for payment source

## 2020-03-22 ENCOUNTER — Encounter (HOSPITAL_COMMUNITY): Payer: Self-pay | Admitting: Internal Medicine

## 2020-03-22 ENCOUNTER — Other Ambulatory Visit: Payer: Self-pay

## 2020-03-22 DIAGNOSIS — Z79890 Hormone replacement therapy (postmenopausal): Secondary | ICD-10-CM | POA: Diagnosis not present

## 2020-03-22 DIAGNOSIS — Z86718 Personal history of other venous thrombosis and embolism: Secondary | ICD-10-CM

## 2020-03-22 DIAGNOSIS — Z79899 Other long term (current) drug therapy: Secondary | ICD-10-CM | POA: Diagnosis not present

## 2020-03-22 DIAGNOSIS — I82413 Acute embolism and thrombosis of femoral vein, bilateral: Secondary | ICD-10-CM | POA: Diagnosis present

## 2020-03-22 DIAGNOSIS — Z86711 Personal history of pulmonary embolism: Secondary | ICD-10-CM | POA: Diagnosis not present

## 2020-03-22 DIAGNOSIS — E039 Hypothyroidism, unspecified: Secondary | ICD-10-CM | POA: Diagnosis present

## 2020-03-22 DIAGNOSIS — E785 Hyperlipidemia, unspecified: Secondary | ICD-10-CM | POA: Diagnosis present

## 2020-03-22 DIAGNOSIS — E872 Acidosis: Secondary | ICD-10-CM | POA: Diagnosis present

## 2020-03-22 DIAGNOSIS — K219 Gastro-esophageal reflux disease without esophagitis: Secondary | ICD-10-CM | POA: Diagnosis present

## 2020-03-22 DIAGNOSIS — E782 Mixed hyperlipidemia: Secondary | ICD-10-CM | POA: Diagnosis not present

## 2020-03-22 DIAGNOSIS — U071 COVID-19: Secondary | ICD-10-CM | POA: Diagnosis not present

## 2020-03-22 DIAGNOSIS — I82220 Acute embolism and thrombosis of inferior vena cava: Secondary | ICD-10-CM | POA: Diagnosis present

## 2020-03-22 DIAGNOSIS — R7989 Other specified abnormal findings of blood chemistry: Secondary | ICD-10-CM

## 2020-03-22 DIAGNOSIS — N179 Acute kidney failure, unspecified: Secondary | ICD-10-CM | POA: Diagnosis present

## 2020-03-22 DIAGNOSIS — Z7982 Long term (current) use of aspirin: Secondary | ICD-10-CM | POA: Diagnosis not present

## 2020-03-22 DIAGNOSIS — I82403 Acute embolism and thrombosis of unspecified deep veins of lower extremity, bilateral: Secondary | ICD-10-CM | POA: Diagnosis not present

## 2020-03-22 DIAGNOSIS — J1282 Pneumonia due to coronavirus disease 2019: Secondary | ICD-10-CM | POA: Diagnosis present

## 2020-03-22 DIAGNOSIS — I82453 Acute embolism and thrombosis of peroneal vein, bilateral: Secondary | ICD-10-CM | POA: Diagnosis present

## 2020-03-22 DIAGNOSIS — D62 Acute posthemorrhagic anemia: Secondary | ICD-10-CM | POA: Diagnosis not present

## 2020-03-22 DIAGNOSIS — R571 Hypovolemic shock: Secondary | ICD-10-CM | POA: Diagnosis not present

## 2020-03-22 DIAGNOSIS — Z85038 Personal history of other malignant neoplasm of large intestine: Secondary | ICD-10-CM | POA: Diagnosis not present

## 2020-03-22 DIAGNOSIS — Z794 Long term (current) use of insulin: Secondary | ICD-10-CM | POA: Diagnosis not present

## 2020-03-22 DIAGNOSIS — T82868A Thrombosis of vascular prosthetic devices, implants and grafts, initial encounter: Secondary | ICD-10-CM | POA: Diagnosis present

## 2020-03-22 DIAGNOSIS — E1110 Type 2 diabetes mellitus with ketoacidosis without coma: Secondary | ICD-10-CM | POA: Diagnosis present

## 2020-03-22 DIAGNOSIS — K56600 Partial intestinal obstruction, unspecified as to cause: Secondary | ICD-10-CM | POA: Diagnosis not present

## 2020-03-22 DIAGNOSIS — K760 Fatty (change of) liver, not elsewhere classified: Secondary | ICD-10-CM | POA: Diagnosis present

## 2020-03-22 DIAGNOSIS — Y831 Surgical operation with implant of artificial internal device as the cause of abnormal reaction of the patient, or of later complication, without mention of misadventure at the time of the procedure: Secondary | ICD-10-CM | POA: Diagnosis present

## 2020-03-22 DIAGNOSIS — A4189 Other specified sepsis: Principal | ICD-10-CM

## 2020-03-22 DIAGNOSIS — E119 Type 2 diabetes mellitus without complications: Secondary | ICD-10-CM

## 2020-03-22 DIAGNOSIS — I82402 Acute embolism and thrombosis of unspecified deep veins of left lower extremity: Secondary | ICD-10-CM | POA: Diagnosis not present

## 2020-03-22 DIAGNOSIS — I82443 Acute embolism and thrombosis of tibial vein, bilateral: Secondary | ICD-10-CM | POA: Diagnosis present

## 2020-03-22 DIAGNOSIS — I82433 Acute embolism and thrombosis of popliteal vein, bilateral: Secondary | ICD-10-CM | POA: Diagnosis present

## 2020-03-22 HISTORY — DX: Acute embolism and thrombosis of unspecified deep veins of lower extremity, bilateral: I82.403

## 2020-03-22 HISTORY — DX: Personal history of other venous thrombosis and embolism: Z86.718

## 2020-03-22 LAB — GLUCOSE, CAPILLARY
Glucose-Capillary: 161 mg/dL — ABNORMAL HIGH (ref 70–99)
Glucose-Capillary: 169 mg/dL — ABNORMAL HIGH (ref 70–99)
Glucose-Capillary: 232 mg/dL — ABNORMAL HIGH (ref 70–99)
Glucose-Capillary: 154 mg/dL — ABNORMAL HIGH (ref 70–99)
Glucose-Capillary: 184 mg/dL — ABNORMAL HIGH (ref 70–99)
Glucose-Capillary: 187 mg/dL — ABNORMAL HIGH (ref 70–99)
Glucose-Capillary: 251 mg/dL — ABNORMAL HIGH (ref 70–99)
Glucose-Capillary: 266 mg/dL — ABNORMAL HIGH (ref 70–99)
Glucose-Capillary: 296 mg/dL — ABNORMAL HIGH (ref 70–99)
Glucose-Capillary: 332 mg/dL — ABNORMAL HIGH (ref 70–99)
Glucose-Capillary: 388 mg/dL — ABNORMAL HIGH (ref 70–99)
Glucose-Capillary: 173 mg/dL — ABNORMAL HIGH (ref 70–99)
Glucose-Capillary: 175 mg/dL — ABNORMAL HIGH (ref 70–99)

## 2020-03-22 LAB — CBC WITH DIFFERENTIAL/PLATELET
Abs Immature Granulocytes: 0.11 10*3/uL — ABNORMAL HIGH (ref 0.00–0.07)
Basophils Absolute: 0 10*3/uL (ref 0.0–0.1)
Basophils Relative: 0 %
Eosinophils Absolute: 0 10*3/uL (ref 0.0–0.5)
Eosinophils Relative: 0 %
HCT: 40.6 % (ref 36.0–46.0)
Hemoglobin: 12.6 g/dL (ref 12.0–15.0)
Immature Granulocytes: 1 %
Lymphocytes Relative: 5 %
Lymphs Abs: 0.8 10*3/uL (ref 0.7–4.0)
MCH: 29.5 pg (ref 26.0–34.0)
MCHC: 31 g/dL (ref 30.0–36.0)
MCV: 95.1 fL (ref 80.0–100.0)
Monocytes Absolute: 0.3 10*3/uL (ref 0.1–1.0)
Monocytes Relative: 2 %
Neutro Abs: 13.8 10*3/uL — ABNORMAL HIGH (ref 1.7–7.7)
Neutrophils Relative %: 92 %
Platelets: 208 10*3/uL (ref 150–400)
RBC: 4.27 MIL/uL (ref 3.87–5.11)
RDW: 13.5 % (ref 11.5–15.5)
WBC: 15.1 10*3/uL — ABNORMAL HIGH (ref 4.0–10.5)
nRBC: 0 % (ref 0.0–0.2)

## 2020-03-22 LAB — BASIC METABOLIC PANEL
Anion gap: 11 (ref 5–15)
BUN: 26 mg/dL — ABNORMAL HIGH (ref 6–20)
CO2: 20 mmol/L — ABNORMAL LOW (ref 22–32)
Calcium: 8.2 mg/dL — ABNORMAL LOW (ref 8.9–10.3)
Chloride: 106 mmol/L (ref 98–111)
Creatinine, Ser: 1.06 mg/dL — ABNORMAL HIGH (ref 0.44–1.00)
GFR calc Af Amer: >60 mL/min (ref >60)
GFR calc non Af Amer: 60 mL/min — ABNORMAL LOW (ref >60)
Glucose, Bld: 206 mg/dL — ABNORMAL HIGH (ref 70–99)
Potassium: 4.7 mmol/L (ref 3.5–5.1)
Sodium: 137 mmol/L (ref 135–145)

## 2020-03-22 LAB — URINALYSIS, ROUTINE W REFLEX MICROSCOPIC
Bilirubin Urine: NEGATIVE
Glucose, UA: >=500 mg/dL — AB
Hgb urine dipstick: NEGATIVE
Ketones, ur: 5 mg/dL — AB
Leukocytes,Ua: NEGATIVE
Nitrite: NEGATIVE
Protein, ur: NEGATIVE mg/dL
Specific Gravity, Urine: 1.038 — ABNORMAL HIGH (ref 1.005–1.030)
pH: 5 (ref 5.0–8.0)

## 2020-03-22 LAB — COMPREHENSIVE METABOLIC PANEL
ALT: 19 U/L (ref 0–44)
AST: 17 U/L (ref 15–41)
Albumin: 3.1 g/dL — ABNORMAL LOW (ref 3.5–5.0)
Alkaline Phosphatase: 77 U/L (ref 38–126)
Anion gap: 16 — ABNORMAL HIGH (ref 5–15)
BUN: 26 mg/dL — ABNORMAL HIGH (ref 6–20)
CO2: 11 mmol/L — ABNORMAL LOW (ref 22–32)
Calcium: 8 mg/dL — ABNORMAL LOW (ref 8.9–10.3)
Chloride: 112 mmol/L — ABNORMAL HIGH (ref 98–111)
Creatinine, Ser: 1.24 mg/dL — ABNORMAL HIGH (ref 0.44–1.00)
GFR calc Af Amer: 57 mL/min — ABNORMAL LOW (ref >60)
GFR calc non Af Amer: 50 mL/min — ABNORMAL LOW (ref >60)
Glucose, Bld: 288 mg/dL — ABNORMAL HIGH (ref 70–99)
Potassium: 4 mmol/L (ref 3.5–5.1)
Sodium: 139 mmol/L (ref 135–145)
Total Bilirubin: 0.8 mg/dL (ref 0.3–1.2)
Total Protein: 6.7 g/dL (ref 6.5–8.1)

## 2020-03-22 LAB — MRSA PCR SCREENING: MRSA by PCR: NEGATIVE

## 2020-03-22 LAB — BETA-HYDROXYBUTYRIC ACID
Beta-Hydroxybutyric Acid: 0.86 mmol/L — ABNORMAL HIGH (ref 0.05–0.27)
Beta-Hydroxybutyric Acid: 0.22 mmol/L (ref 0.05–0.27)

## 2020-03-22 LAB — LACTIC ACID, PLASMA
Lactic Acid, Venous: 5.6 mmol/L (ref 0.5–1.9)
Lactic Acid, Venous: 4.1 mmol/L (ref 0.5–1.9)

## 2020-03-22 LAB — PROCALCITONIN: Procalcitonin: 0.21 ng/mL

## 2020-03-22 LAB — POTASSIUM: Potassium: 5.5 mmol/L — ABNORMAL HIGH (ref 3.5–5.1)

## 2020-03-22 LAB — URINE CULTURE: Culture: NO GROWTH

## 2020-03-22 LAB — TSH: TSH: 10.445 u[IU]/mL — ABNORMAL HIGH (ref 0.350–4.500)

## 2020-03-22 LAB — C-REACTIVE PROTEIN: CRP: 6.6 mg/dL — ABNORMAL HIGH (ref <1.0)

## 2020-03-22 LAB — MAGNESIUM: Magnesium: 1.5 mg/dL — ABNORMAL LOW (ref 1.7–2.4)

## 2020-03-22 LAB — HEPARIN LEVEL (UNFRACTIONATED): Heparin Unfractionated: 1 IU/mL — ABNORMAL HIGH (ref 0.30–0.70)

## 2020-03-22 LAB — FERRITIN: Ferritin: 254 ng/mL (ref 11–307)

## 2020-03-22 LAB — PHOSPHORUS: Phosphorus: 3.8 mg/dL (ref 2.5–4.6)

## 2020-03-22 LAB — HIV ANTIBODY (ROUTINE TESTING W REFLEX): HIV Screen 4th Generation wRfx: NONREACTIVE

## 2020-03-22 LAB — D-DIMER, QUANTITATIVE: D-Dimer, Quant: >20 ug/mL-FEU — ABNORMAL HIGH (ref 0.00–0.50)

## 2020-03-22 LAB — CK: Total CK: 109 U/L (ref 38–234)

## 2020-03-22 MED ORDER — SODIUM CHLORIDE 0.9 % IV SOLN
500.0000 mg | INTRAVENOUS | Status: DC
  Administered 2020-03-22: 500 mg via INTRAVENOUS
  Filled 2020-03-22 (×3): qty 500

## 2020-03-22 MED ORDER — HEPARIN BOLUS VIA INFUSION
4000.0000 [IU] | Freq: Once | INTRAVENOUS | Status: DC
  Filled 2020-03-22: qty 4000

## 2020-03-22 MED ORDER — SENNOSIDES-DOCUSATE SODIUM 8.6-50 MG PO TABS: 1.0000 | ORAL_TABLET | Freq: Every evening | ORAL | Status: DC | PRN

## 2020-03-22 MED ORDER — LEVOTHYROXINE SODIUM 100 MCG PO TABS
100.0000 ug | ORAL_TABLET | Freq: Every day | ORAL | Status: DC
  Administered 2020-03-23 – 2020-04-06 (×15): 100 ug via ORAL
  Filled 2020-03-22 (×14): qty 1

## 2020-03-22 MED ORDER — ZINC SULFATE 220 (50 ZN) MG PO CAPS
220.0000 mg | ORAL_CAPSULE | Freq: Every day | ORAL | Status: DC
  Administered 2020-03-22 – 2020-04-06 (×14): 220 mg via ORAL
  Filled 2020-03-22 (×14): qty 1

## 2020-03-22 MED ORDER — OXYCODONE HCL 5 MG PO TABS
10.0000 mg | ORAL_TABLET | ORAL | Status: DC | PRN
  Administered 2020-03-27 – 2020-04-05 (×21): 10 mg via ORAL
  Filled 2020-03-22 (×22): qty 2

## 2020-03-22 MED ORDER — INSULIN ASPART 100 UNIT/ML ~~LOC~~ SOLN
0.0000 [IU] | Freq: Three times a day (TID) | SUBCUTANEOUS | Status: DC
  Administered 2020-03-22: 5 [IU] via SUBCUTANEOUS
  Administered 2020-03-22: 3 [IU] via SUBCUTANEOUS
  Administered 2020-03-23 (×3): 5 [IU] via SUBCUTANEOUS
  Administered 2020-03-24: 8 [IU] via SUBCUTANEOUS
  Administered 2020-03-24: 3 [IU] via SUBCUTANEOUS
  Administered 2020-03-24: 8 [IU] via SUBCUTANEOUS
  Administered 2020-03-25 (×2): 5 [IU] via SUBCUTANEOUS
  Administered 2020-03-25: 8 [IU] via SUBCUTANEOUS
  Administered 2020-03-26 (×2): 3 [IU] via SUBCUTANEOUS
  Administered 2020-03-26 – 2020-03-27 (×2): 5 [IU] via SUBCUTANEOUS
  Administered 2020-03-27: 3 [IU] via SUBCUTANEOUS
  Administered 2020-03-27: 8 [IU] via SUBCUTANEOUS
  Administered 2020-03-29: 5 [IU] via SUBCUTANEOUS
  Administered 2020-03-29 – 2020-03-30 (×2): 3 [IU] via SUBCUTANEOUS
  Administered 2020-03-30: 2 [IU] via SUBCUTANEOUS
  Administered 2020-03-30: 3 [IU] via SUBCUTANEOUS
  Administered 2020-03-31 – 2020-04-10 (×10): 2 [IU] via SUBCUTANEOUS

## 2020-03-22 MED ORDER — MAGNESIUM SULFATE 2 GM/50ML IV SOLN
2.0000 g | Freq: Once | INTRAVENOUS | Status: AC
  Administered 2020-03-22: 2 g via INTRAVENOUS
  Filled 2020-03-22: qty 50

## 2020-03-22 MED ORDER — FAMOTIDINE 20 MG PO TABS
20.0000 mg | ORAL_TABLET | Freq: Two times a day (BID) | ORAL | Status: DC
  Administered 2020-03-22 – 2020-04-05 (×26): 20 mg via ORAL
  Filled 2020-03-22 (×27): qty 1

## 2020-03-22 MED ORDER — SODIUM ZIRCONIUM CYCLOSILICATE 10 G PO PACK
10.0000 g | PACK | Freq: Once | ORAL | Status: AC
  Administered 2020-03-22: 10 g via ORAL
  Filled 2020-03-22: qty 1

## 2020-03-22 MED ORDER — SODIUM CHLORIDE 0.9 % IV BOLUS
1000.0000 mL | Freq: Once | INTRAVENOUS | Status: AC
  Administered 2020-03-22: 1000 mL via INTRAVENOUS

## 2020-03-22 MED ORDER — SODIUM CHLORIDE 0.9 % IV SOLN
100.0000 mg | Freq: Every day | INTRAVENOUS | Status: AC
  Administered 2020-03-23 – 2020-03-26 (×4): 100 mg via INTRAVENOUS
  Filled 2020-03-22 (×5): qty 20

## 2020-03-22 MED ORDER — SODIUM CHLORIDE 0.9 % IV SOLN: INTRAVENOUS | Status: DC

## 2020-03-22 MED ORDER — HYDROMORPHONE HCL 1 MG/ML IJ SOLN
1.0000 mg | INTRAMUSCULAR | Status: DC | PRN
  Administered 2020-03-22 (×2): 1 mg via INTRAVENOUS
  Filled 2020-03-22 (×2): qty 1

## 2020-03-22 MED ORDER — ACETAMINOPHEN 500 MG PO TABS: 1000.0000 mg | ORAL_TABLET | Freq: Four times a day (QID) | ORAL | Status: DC | PRN

## 2020-03-22 MED ORDER — SODIUM CHLORIDE 0.9 % IV SOLN
1.0000 g | Freq: Once | INTRAVENOUS | Status: AC
  Administered 2020-03-22: 1 g via INTRAVENOUS
  Filled 2020-03-22: qty 10

## 2020-03-22 MED ORDER — HEPARIN BOLUS VIA INFUSION
2000.0000 [IU] | Freq: Once | INTRAVENOUS | Status: AC
  Administered 2020-03-22: 2000 [IU] via INTRAVENOUS
  Filled 2020-03-22: qty 2000

## 2020-03-22 MED ORDER — ONDANSETRON HCL 4 MG/2ML IJ SOLN
4.0000 mg | Freq: Four times a day (QID) | INTRAMUSCULAR | Status: DC | PRN
  Administered 2020-03-30 – 2020-04-07 (×7): 4 mg via INTRAVENOUS
  Filled 2020-03-22 (×7): qty 2

## 2020-03-22 MED ORDER — ONDANSETRON HCL 4 MG PO TABS
4.0000 mg | ORAL_TABLET | Freq: Four times a day (QID) | ORAL | Status: DC | PRN
  Administered 2020-04-05 – 2020-04-06 (×3): 4 mg via ORAL
  Filled 2020-03-22 (×3): qty 1

## 2020-03-22 MED ORDER — GUAIFENESIN-DM 100-10 MG/5ML PO SYRP: 10.0000 mL | ORAL_SOLUTION | ORAL | Status: DC | PRN

## 2020-03-22 MED ORDER — SIMVASTATIN 20 MG PO TABS
10.0000 mg | ORAL_TABLET | Freq: Every day | ORAL | Status: DC
  Administered 2020-03-22 – 2020-04-06 (×14): 10 mg via ORAL
  Filled 2020-03-22 (×14): qty 1

## 2020-03-22 MED ORDER — DEXTROSE-NACL 5-0.45 % IV SOLN
INTRAVENOUS | Status: DC
  Administered 2020-03-22: 1000 mL via INTRAVENOUS

## 2020-03-22 MED ORDER — ZOLPIDEM TARTRATE 5 MG PO TABS
5.0000 mg | ORAL_TABLET | Freq: Every evening | ORAL | Status: DC | PRN
  Administered 2020-03-22 – 2020-04-05 (×6): 5 mg via ORAL
  Filled 2020-03-22 (×6): qty 1

## 2020-03-22 MED ORDER — HYDROMORPHONE HCL 1 MG/ML IJ SOLN
0.5000 mg | INTRAMUSCULAR | Status: DC | PRN
  Administered 2020-03-23 – 2020-03-25 (×5): 0.5 mg via INTRAVENOUS
  Filled 2020-03-22 (×5): qty 0.5

## 2020-03-22 MED ORDER — DEXTROSE 50 % IV SOLN: 0.0000 mL | INTRAVENOUS | Status: DC | PRN

## 2020-03-22 MED ORDER — STERILE WATER FOR INJECTION IV SOLN
INTRAVENOUS | Status: DC
  Filled 2020-03-22 (×2): qty 850

## 2020-03-22 MED ORDER — IPRATROPIUM-ALBUTEROL 20-100 MCG/ACT IN AERS
1.0000 | INHALATION_SPRAY | Freq: Four times a day (QID) | RESPIRATORY_TRACT | Status: DC
  Administered 2020-03-22 – 2020-03-31 (×27): 1 via RESPIRATORY_TRACT
  Filled 2020-03-22 (×2): qty 4

## 2020-03-22 MED ORDER — HEPARIN (PORCINE) 25000 UT/250ML-% IV SOLN
1300.0000 [IU]/h | INTRAVENOUS | Status: DC
  Administered 2020-03-22: 1300 [IU]/h via INTRAVENOUS
  Filled 2020-03-22: qty 250

## 2020-03-22 MED ORDER — DEXAMETHASONE SODIUM PHOSPHATE 10 MG/ML IJ SOLN
6.0000 mg | INTRAMUSCULAR | Status: DC
  Administered 2020-03-22 – 2020-03-25 (×4): 6 mg via INTRAVENOUS
  Filled 2020-03-22 (×4): qty 1

## 2020-03-22 MED ORDER — SODIUM CHLORIDE 0.9% FLUSH
3.0000 mL | Freq: Two times a day (BID) | INTRAVENOUS | Status: DC
  Administered 2020-03-22 – 2020-04-10 (×25): 3 mL via INTRAVENOUS

## 2020-03-22 MED ORDER — FENTANYL CITRATE (PF) 100 MCG/2ML IJ SOLN
50.0000 ug | Freq: Once | INTRAMUSCULAR | Status: AC
  Administered 2020-03-22: 50 ug via INTRAVENOUS
  Filled 2020-03-22: qty 2

## 2020-03-22 MED ORDER — GADOBUTROL 1 MMOL/ML IV SOLN
9.5000 mL | Freq: Once | INTRAVENOUS | Status: AC | PRN
  Administered 2020-03-22: 9.5 mL via INTRAVENOUS

## 2020-03-22 MED ORDER — HYDROCOD POLST-CPM POLST ER 10-8 MG/5ML PO SUER: 5.0000 mL | Freq: Two times a day (BID) | ORAL | Status: DC | PRN

## 2020-03-22 MED ORDER — HEPARIN (PORCINE) 25000 UT/250ML-% IV SOLN
900.0000 [IU]/h | INTRAVENOUS | Status: DC
  Administered 2020-03-23 – 2020-03-24 (×2): 950 [IU]/h via INTRAVENOUS
  Administered 2020-03-26: 1000 [IU]/h via INTRAVENOUS
  Filled 2020-03-22 (×7): qty 250

## 2020-03-22 MED ORDER — INSULIN ASPART 100 UNIT/ML ~~LOC~~ SOLN
0.0000 [IU] | Freq: Every day | SUBCUTANEOUS | Status: DC
  Administered 2020-03-23 – 2020-03-31 (×7): 2 [IU] via SUBCUTANEOUS

## 2020-03-22 MED ORDER — CHLORHEXIDINE GLUCONATE CLOTH 2 % EX PADS
6.0000 | MEDICATED_PAD | Freq: Every day | CUTANEOUS | Status: DC
  Administered 2020-03-23 – 2020-03-28 (×6): 6 via TOPICAL

## 2020-03-22 MED ORDER — HEPARIN SODIUM (PORCINE) 5000 UNIT/ML IJ SOLN
5000.0000 [IU] | Freq: Three times a day (TID) | INTRAMUSCULAR | Status: DC
  Administered 2020-03-22: 5000 [IU] via SUBCUTANEOUS
  Filled 2020-03-22: qty 1

## 2020-03-22 MED ORDER — SODIUM CHLORIDE 0.9 % IV SOLN
1.0000 g | INTRAVENOUS | Status: DC
  Administered 2020-03-22 – 2020-03-24 (×3): 1 g via INTRAVENOUS
  Filled 2020-03-22 (×3): qty 10

## 2020-03-22 MED ORDER — ASCORBIC ACID 500 MG PO TABS
500.0000 mg | ORAL_TABLET | Freq: Every day | ORAL | Status: DC
  Administered 2020-03-22 – 2020-04-06 (×14): 500 mg via ORAL
  Filled 2020-03-22 (×14): qty 1

## 2020-03-22 MED ORDER — INSULIN REGULAR(HUMAN) IN NACL 100-0.9 UT/100ML-% IV SOLN
INTRAVENOUS | Status: DC
  Administered 2020-03-22: 13 [IU]/h via INTRAVENOUS
  Filled 2020-03-22: qty 100

## 2020-03-22 MED ORDER — LACTATED RINGERS IV BOLUS
500.0000 mL | Freq: Once | INTRAVENOUS | Status: AC
  Administered 2020-03-22: 500 mL via INTRAVENOUS

## 2020-03-22 MED ORDER — SODIUM CHLORIDE 0.9 % IV SOLN
200.0000 mg | Freq: Once | INTRAVENOUS | Status: AC
  Administered 2020-03-22: 200 mg via INTRAVENOUS
  Filled 2020-03-22: qty 40

## 2020-03-22 MED ORDER — INSULIN GLARGINE 100 UNIT/ML ~~LOC~~ SOLN
8.0000 [IU] | Freq: Every day | SUBCUTANEOUS | Status: DC
  Administered 2020-03-22 – 2020-03-23 (×2): 8 [IU] via SUBCUTANEOUS
  Filled 2020-03-22 (×2): qty 0.08

## 2020-03-22 NOTE — Progress Notes (Signed)
Notified provider of need to order repeat lactic acid. ° °

## 2020-03-22 NOTE — Progress Notes (Signed)
Venous duplex       has been completed. Preliminary results can be found under CV proc through chart review. June Leap, BS, RDMS, RVT   Paged Dr. [facility] with positive results.

## 2020-03-22 NOTE — Progress Notes (Signed)
[name] [id number] Admission Data: 03/22/2020 7:31 AM Attending Provider: Albertine Patricia, MD  [id number], [name] Mech, MD Consults/ Treatment Team:   [name] is a 54 y.o. female patient admitted from ED awake, alert  & orientated  X 3,  Full Code, VSS - Blood pressure 113/84, pulse 99, temperature 98.1 F (36.7 C), temperature source Oral, resp. rate 16, height 5\' 11"  (1.803 m), weight 94.2 kg, last menstrual period 09/12/2011, SpO2 100 %., O2   Room air, no c/o shortness of breath, no c/o chest pain, no distress noted. Tele # [id number] placed and pt is currently running:normal sinus rhythm.   IV site WDL:  hand left, condition patent and no redness and antecubital right, condition patent and no redness with a transparent dsg that's clean dry and intact.  Allergies:   Allergies  Allergen Reactions  . Metformin And Related Other (See Comments)    Chest pain  . Penicillins Hives     Past Medical History:  Diagnosis Date  . 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  ECC with HPV effect  . 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 Diabetes mellitus without complication ([place])   . Headache(784.0)    experiences migraine every couple of weeks. takes only excedrin  . 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  colposcopy adequate normal 10/2015   . Hyperlipidemia   . Pulmonary embolism ([place])   . STD (sexually transmitted disease) 1991   POS GC-TREATED  . Thyroid disease    Pt orientation to unit, room and routine. Information packet given to patient/family and safety video watched.  Admission INP armband ID verified with patient/family, and in place. SR up x 2, fall risk assessment complete with Patient and family verbalizing understanding of risks associated with falls. Pt verbalizes an understanding of how to use the call bell and to call for help  before getting out of bed.  Skin, clean-dry- intact without evidence of bruising, or skin tears.   No evidence of skin break down noted on exam. no rashes, no ecchymoses, no wounds  Will cont to monitor and assist as needed.  Walker Shadow, RN 03/22/2020 7:31 AM

## 2020-03-22 NOTE — ED Notes (Signed)
ED TO INPATIENT HANDOFF REPORT  ED Nurse Name and Phone #:  Gwyndolyn Saxon [phone]  S Name/Age/Gender [name] 54 y.o. female Room/Bed: 022C/022C  Code Status   Code Status: Full Code  Home/SNF/Other Home Patient oriented to: self, place, time and situation Is this baseline? Yes   Triage Complete: Triage complete  Chief Complaint Sepsis due to COVID-19 ([place]) [U07.1, A41.89]  Triage Note BIB [facility] after pt reported severe back pain, numbness and tingling to BIL LE after toileting and hearing a "pop" while standing up. EMS reports on arrival that pt was diaphoretic, hypotensive with a pressure in the 70's, and vomiting x 1. Upon arrival to hospital pt reports both severe abdominal an back pain, continued numbness and tingling sensation in BIL LE. Pt b/p 89/73, HR 84, 02 100% RA. Pt has hx DM Type 2. Pt tested pos. covid on 5/21.     Allergies Allergies  Allergen Reactions  . Metformin And Related Other (See Comments)    Chest pain  . Penicillins Hives    Level of Care/Admitting Diagnosis ED Disposition    ED Disposition Condition Comment   Admit  Hospital Area: [place] [[id number]]  Level of Care: Progressive [102]  Admit to Progressive based on following criteria: GI, ENDOCRINE disease patients with GI bleeding, acute liver failure or pancreatitis, stable with diabetic ketoacidosis or thyrotoxicosis (hypothyroid) state.  May admit patient to [facility] or [facility] if equivalent level of care is available:: No  Covid Evaluation: Confirmed COVID Positive  Diagnosis: [facility]19 [id number]  Admitting Physician: Lenore Cordia [id number]  Attending Physician: Lenore Cordia [id number]  Estimated length of stay: past midnight tomorrow  Certification:: I certify this patient will need inpatient services for at least 2 midnights       B Medical/Surgery History Past Medical History:  Diagnosis Date  . ASCUS with positive high  risk HPV cervical 11/2018   Colposcopy normal.  ECC with HPV effect  . 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 Diabetes mellitus without complication ([place])   . Headache(784.0)    experiences migraine every couple of weeks. takes only excedrin  . 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  colposcopy adequate normal 10/2015   . Hyperlipidemia   . Pulmonary embolism ([place])   . STD (sexually transmitted disease) 1991   POS GC-TREATED  . Thyroid disease    Past Surgical History:  Procedure Laterality Date  . CESAREAN SECTION  2005  . COLON SURGERY    . INSERTION OF VENA CAVA FILTER Right 2012  . none    . PORT-A-CATH REMOVAL Right 12/22/2012   Procedure: REMOVAL PORT-A-CATH;  Surgeon: Odis Hollingshead, MD;  Location: [facility];  Service: General;  Laterality: Right;  . PORTACATH PLACEMENT  02/21/11   tip in mid [id number]  . 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  03/22/20    0145    Straight-tip   less than 1   Incision 12/22/12 Chest Right   12/22/12    1349     2647          Intake/Output Last 24 hours  Intake/Output Summary (Last 24 hours) at 03/22/2020 [id number] Last data filed at 03/22/2020 0421 Gross per 24 hour  Intake 4250 ml  Output --  Net 4250 ml    Labs/Imaging Results for orders placed or performed during the hospital encounter of 03/21/20 (from the past 48 hour(s))  Comprehensive metabolic panel     Status: Abnormal   Collection Time: 03/21/20  9:43 PM  Result Value Ref Range   Sodium  136 135 - 145 mmol/L   Potassium 5.9 (H) 3.5 - 5.1 mmol/L    Comment: HEMOLYSIS AT THIS LEVEL MAY AFFECT RESULT   Chloride 105 98 - 111 mmol/L   CO2 15 (L) 22 - 32 mmol/L   Glucose, Bld 368 (H) 70 - 99 mg/dL    Comment: Glucose reference range applies only to samples taken after fasting for at least 8 hours.   BUN 31 (H) 6 - 20 mg/dL   Creatinine, Ser 1.43 (H) 0.44 - 1.00 mg/dL   Calcium 8.7 (L) 8.9 - 10.3 mg/dL   Total Protein 6.9 6.5 - 8.1 g/dL   Albumin 3.4 (L) 3.5 - 5.0 g/dL   AST 31 15 - 41 U/L   ALT 23 0 - 44 U/L   Alkaline Phosphatase 74 38 - 126 U/L   Total Bilirubin 1.8 (H) 0.3 - 1.2 mg/dL   GFR calc non Af Amer 42 (L) >60 mL/min   GFR calc Af Amer 48 (L) >60 mL/min   Anion gap 16 (H) 5 - 15    Comment: Performed at [facility] [address] [place], [place] [address]  CBC WITH DIFFERENTIAL     Status: Abnormal   Collection Time: 03/21/20  9:43 PM  Result Value Ref Range   WBC 15.2 (H) 4.0 - 10.5 K/uL   RBC 4.48 3.87 - 5.11 MIL/uL   Hemoglobin 13.3 12.0 - 15.0 g/dL   HCT 41.8 36.0 - 46.0 %   MCV 93.3 80.0 - 100.0 fL   MCH 29.7 26.0 - 34.0 pg   MCHC 31.8 30.0 - 36.0 g/dL   RDW 13.2 11.5 - 15.5 %   Platelets 270 150 - 400 K/uL   nRBC 0.0 0.0 - 0.2 %   Neutrophils Relative % 86 %   Neutro Abs 13.2 (H) 1.7 - 7.7 K/uL   Lymphocytes Relative 9 %   Lymphs Abs 1.3 0.7 - 4.0 K/uL   Monocytes Relative 4 %   Monocytes Absolute 0.6 0.1 - 1.0 K/uL   Eosinophils Relative 0 %   Eosinophils Absolute 0.0 0.0 - 0.5 K/uL   Basophils Relative 0 %   Basophils Absolute 0.0 0.0 - 0.1 K/uL   Immature Granulocytes 1 %   Abs Immature Granulocytes 0.09 (H) 0.00 - 0.07 K/uL    Comment: Performed at [facility] [address], [place],  [address]  APTT     Status: Abnormal   Collection Time: 03/21/20  9:43 PM  Result Value Ref Range   aPTT 52 (H) 24 - 36 seconds    Comment:        IF BASELINE aPTT IS ELEVATED, SUGGEST PATIENT RISK ASSESSMENT BE USED TO  DETERMINE APPROPRIATE ANTICOAGULANT THERAPY. Performed at [facility]  [facility], [address], [place], [place] [address]   Protime-INR     Status: Abnormal   Collection Time: 03/21/20  9:43 PM  Result Value Ref Range   Prothrombin Time 17.3 (H) 11.4 - 15.2 seconds   INR 1.5 (H) 0.8 - 1.2    Comment: (NOTE) INR goal varies based on device and disease states. Performed at [facility], [place] [address], [place], [place] [address]   Troponin I (High Sensitivity)     Status: None   Collection Time: 03/21/20  9:43 PM  Result Value Ref Range   Troponin I (High Sensitivity) <2 <18 ng/L    Comment: (NOTE) Elevated high sensitivity troponin I (hsTnI) values and significant  changes across serial measurements may suggest ACS but many other  chronic and acute conditions are known to elevate hsTnI results.  Refer to the Links section for chest pain algorithms and additional  guidance. Performed at [facility], [place] [address], [place], [place] [address]   D-dimer, quantitative (not at [facility])     Status: Abnormal   Collection Time: 03/21/20  9:43 PM  Result Value Ref Range   D-Dimer, Quant >20.00 (H) 0.00 - 0.50 ug/mL-FEU    Comment: REPEATED TO VERIFY (NOTE) At the manufacturer cut-off of 0.50 ug/mL FEU, this assay has been documented to exclude PE with a sensitivity and negative predictive value of 97 to 99%.  At this time, this assay has not been approved by the FDA to exclude DVT/VTE. Results should be correlated with clinical presentation. Performed at [facility], [place] [address], [place], [place] [address]   Brain natriuretic peptide     Status: None   Collection Time: 03/21/20  9:48 PM  Result Value Ref Range   B Natriuretic Peptide 29.9 0.0 - 100.0 pg/mL    Comment: Performed at [facility] [address], [place], [name] [address]  Lactic acid, plasma     Status: Abnormal   Collection Time: 03/21/20 10:16 PM  Result Value Ref Range   Lactic Acid,  Venous 5.4 (HH) 0.5 - 1.9 mmol/L    Comment: CRITICAL RESULT CALLED TO, READ BACK BY AND VERIFIED WITH: Virginia Rochester RN [id number] Sander Radon Performed at [facility], [place] [address] [place], [place] [address]   Potassium     Status: Abnormal   Collection Time: 03/21/20 11:36 PM  Result Value Ref Range   Potassium 5.5 (H) 3.5 - 5.1 mmol/L    Comment: Performed at [facility], [place] [address], [place], [place] [address]  CK     Status: None   Collection Time: 03/21/20 11:36 PM  Result Value Ref Range   Total CK 109 38 - 234 U/L    Comment: Performed at [facility], [place] [address], [place], [place] [address]  Beta-hydroxybutyric acid     Status: Abnormal   Collection Time: 03/21/20 11:38 PM  Result Value Ref Range   Beta-Hydroxybutyric Acid 0.86 (H) 0.05 - 0.27 mmol/L    Comment: Performed at [facility] [address] [place], [place] [address]  Urinalysis, Routine w reflex microscopic     Status: Abnormal   Collection Time: 03/22/20  1:24 AM  Result Value Ref Range   Color, Urine YELLOW YELLOW   APPearance HAZY (A) CLEAR   Specific Gravity, Urine 1.038 (H) 1.005 - 1.030   pH 5.0 5.0 - 8.0   Glucose, UA >=500 (A) NEGATIVE  mg/dL   Hgb urine dipstick NEGATIVE NEGATIVE   Bilirubin Urine NEGATIVE NEGATIVE   Ketones, ur 5 (A) NEGATIVE mg/dL   Protein, ur NEGATIVE NEGATIVE mg/dL   Nitrite NEGATIVE NEGATIVE   Leukocytes,Ua NEGATIVE NEGATIVE   RBC / HPF 0-5 0 - 5 RBC/hpf   WBC, UA 0-5 0 - 5 WBC/hpf   Bacteria, UA RARE (A) NONE SEEN   Mucus PRESENT    Hyaline Casts, UA PRESENT     Comment: Performed at [facility] [address], [place], [name] [address]  Lactic acid, plasma     Status: Abnormal   Collection Time: 03/22/20  2:03 AM  Result Value Ref Range   Lactic Acid, Venous 5.6 (HH) 0.5 - 1.9 mmol/L    Comment: CRITICAL VALUE NOTED.  VALUE IS CONSISTENT WITH PREVIOUSLY REPORTED AND CALLED VALUE. Performed at [facility], [place]  [address] [place], [place] [address]   Procalcitonin - Baseline     Status: None   Collection Time: 03/22/20  2:03 AM  Result Value Ref Range   Procalcitonin 0.21 ng/mL    Comment:        Interpretation: PCT (Procalcitonin) <= 0.5 ng/mL: Systemic infection (sepsis) is not likely. Local bacterial infection is possible. (NOTE)       Sepsis PCT Algorithm           Lower Respiratory Tract                                      Infection PCT Algorithm    ----------------------------     ----------------------------         PCT < 0.25 ng/mL                PCT < 0.10 ng/mL         Strongly encourage             Strongly discourage   discontinuation of antibiotics    initiation of antibiotics    ----------------------------     -----------------------------       PCT 0.25 - 0.50 ng/mL            PCT 0.10 - 0.25 ng/mL               OR       >80% decrease in PCT            Discourage initiation of                                            antibiotics      Encourage discontinuation           of antibiotics    ----------------------------     -----------------------------         PCT >= 0.50 ng/mL              PCT 0.26 - 0.50 ng/mL               AND        <80% decrease in PCT             Encourage initiation of  antibiotics       Encourage continuation           of antibiotics    ----------------------------     -----------------------------        PCT >= 0.50 ng/mL                  PCT > 0.50 ng/mL               AND         increase in PCT                  Strongly encourage                                      initiation of antibiotics    Strongly encourage escalation           of antibiotics                                     -----------------------------                                           PCT <= 0.25 ng/mL                                                 OR                                        > 80% decrease in PCT                                      Discontinue / Do not initiate                                             antibiotics Performed at [facility], [address], [place], [place] [address]   CBC with Differential/Platelet     Status: Abnormal   Collection Time: 03/22/20  2:41 AM  Result Value Ref Range   WBC 15.1 (H) 4.0 - 10.5 K/uL   RBC 4.27 3.87 - 5.11 MIL/uL   Hemoglobin 12.6 12.0 - 15.0 g/dL   HCT 40.6 36.0 - 46.0 %   MCV 95.1 80.0 - 100.0 fL   MCH 29.5 26.0 - 34.0 pg   MCHC 31.0 30.0 - 36.0 g/dL   RDW 13.5 11.5 - 15.5 %   Platelets 208 150 - 400 K/uL   nRBC 0.0 0.0 - 0.2 %   Neutrophils Relative % 92 %   Neutro Abs 13.8 (H) 1.7 - 7.7 K/uL   Lymphocytes Relative 5 %   Lymphs Abs 0.8 0.7 - 4.0 K/uL   Monocytes Relative 2 %   Monocytes Absolute 0.3 0.1 - 1.0 K/uL   Eosinophils Relative 0 %  Eosinophils Absolute 0.0 0.0 - 0.5 K/uL   Basophils Relative 0 %   Basophils Absolute 0.0 0.0 - 0.1 K/uL   Immature Granulocytes 1 %   Abs Immature Granulocytes 0.11 (H) 0.00 - 0.07 K/uL    Comment: Performed at [facility] [address], [place], [place] [address]  Comprehensive metabolic panel     Status: Abnormal   Collection Time: 03/22/20  2:41 AM  Result Value Ref Range   Sodium 139 135 - 145 mmol/L   Potassium 4.0 3.5 - 5.1 mmol/L   Chloride 112 (H) 98 - 111 mmol/L   CO2 11 (L) 22 - 32 mmol/L   Glucose, Bld 288 (H) 70 - 99 mg/dL    Comment: Glucose reference range applies only to samples taken after fasting for at least 8 hours.   BUN 26 (H) 6 - 20 mg/dL   Creatinine, Ser 1.24 (H) 0.44 - 1.00 mg/dL   Calcium 8.0 (L) 8.9 - 10.3 mg/dL   Total Protein 6.7 6.5 - 8.1 g/dL   Albumin 3.1 (L) 3.5 - 5.0 g/dL   AST 17 15 - 41 U/L   ALT 19 0 - 44 U/L   Alkaline Phosphatase 77 38 - 126 U/L   Total Bilirubin 0.8 0.3 - 1.2 mg/dL   GFR calc non Af Amer 50 (L) >60 mL/min   GFR calc Af Amer 57 (L) >60 mL/min   Anion gap 16 (H) 5 - 15    Comment: Performed at [facility] [address], [place], [place] [address]  D-dimer, quantitative (not at [facility])     Status: Abnormal   Collection Time: 03/22/20  2:41 AM  Result Value Ref Range   D-Dimer, Quant >20.00 (H) 0.00 - 0.50 ug/mL-FEU    Comment: REPEATED TO VERIFY (NOTE) At the manufacturer cut-off of 0.50 ug/mL FEU, this assay has been documented to exclude PE with a sensitivity and negative predictive value of 97 to 99%.  At this time, this assay has not been approved by the FDA to exclude DVT/VTE. Results should be correlated with clinical presentation. Performed at [facility], [place] [address] [place], [name] [address]   Ferritin     Status: None   Collection Time: 03/22/20  2:41 AM  Result Value Ref Range   Ferritin 254 11 - 307 ng/mL    Comment: Performed at [facility], [place] [address] [place], [place] [address]  Magnesium     Status: Abnormal   Collection Time: 03/22/20  2:41 AM  Result Value Ref Range   Magnesium 1.5 (L) 1.7 - 2.4 mg/dL    Comment: Performed at [facility] [address], [place], [place] [address]  Phosphorus     Status: None   Collection Time: 03/22/20  2:41 AM  Result Value Ref Range   Phosphorus 3.8 2.5 - 4.6 mg/dL    Comment: Performed at [facility] [address] [place], [place] [address]  TSH     Status: Abnormal   Collection Time: 03/22/20  2:41 AM  Result Value Ref Range   TSH 10.445 (H) 0.350 - 4.500 uIU/mL    Comment: Performed by a 3rd Generation assay with a functional sensitivity of <=0.01 uIU/mL. Performed at [facility], [place] [address], [place], [place] [address]    CT Angio Chest PE W and/or Wo Contrast  Result Date: 03/22/2020 CLINICAL DATA:  Severe back pain hypotension and shallow  EXAM: CT ANGIOGRAPHY CHEST WITH CONTRAST TECHNIQUE: Multidetector CT imaging of the chest was performed using the standard protocol during bolus administration of intravenous contrast. Multiplanar CT image reconstructions and MIPs were obtained to  evaluate the vascular anatomy. CONTRAST:  52mL OMNIPAQUE IOHEXOL 350 MG/ML SOLN COMPARISON:  None. FINDINGS: Cardiovascular: There is a optimal opacification of the pulmonary arteries. There is no central,segmental, or subsegmental filling defects within the pulmonary arteries. The heart is normal in size. No pericardial effusion or thickening. No evidence right heart strain. There is normal three-vessel brachiocephalic anatomy without proximal stenosis. The thoracic aorta is normal in appearance. Mediastinum/Nodes: No hilar, mediastinal, or axillary adenopathy. Thyroid gland, trachea, and esophagus demonstrate no significant findings. Lungs/Pleura: Multifocal patchy ground-glass opacities are seen predominantly within the periphery of both lungs. No pleural effusion or pneumothorax is seen. Upper Abdomen: No acute abnormalities present in the visualized portions of the upper abdomen. Musculoskeletal: No chest wall abnormality. No acute or significant osseous findings. Review of the MIP images confirms the above findings. Abdomen/pelvis: Hepatobiliary: There is diffuse low density seen throughout the liver parenchyma. The main portal vein is patent. No evidence of calcified gallstones, gallbladder wall thickening or biliary dilatation. Pancreas: Unremarkable. No pancreatic ductal dilatation or surrounding inflammatory changes. Spleen: Normal in size without focal abnormality. Adrenals/Urinary Tract: Both adrenal glands appear normal. The kidneys and collecting system appear normal without evidence of urinary tract calculus or hydronephrosis. Bladder is unremarkable. Stomach/Bowel: The stomach, small bowel, and colon are normal in appearance. No inflammatory changes, wall thickening, or obstructive findings. There appears to be a surgical anastomosis at the sigmoid rectal junction. Mild presacral soft tissue density is again identified. Vascular/Lymphatic: There are no enlarged mesenteric, retroperitoneal, or pelvic  lymph nodes. An infrarenal IVC filter is seen at approximately the L3 level. There is scattered aortic and bi-iliac calcifications noted. Reproductive: The uterus and adnexa are unremarkable. Other: No evidence of abdominal wall mass or hernia. Musculoskeletal: No acute or significant osseous findings. IMPRESSION: No central, segmental, or subsegmental pulmonary embolism. Multifocal patchy airspace opacities throughout both lungs, likely consistent with multifocal pneumonia. Hepatic steatosis Aortic Atherosclerosis (ICD10-I70.0). Electronically Signed   By: Prudencio Pair M.D.   On: 03/22/2020 00:07   CT ABDOMEN PELVIS W CONTRAST  Result Date: 03/22/2020 CLINICAL DATA:  Severe back pain hypotension and shallow EXAM: CT ANGIOGRAPHY CHEST WITH CONTRAST TECHNIQUE: Multidetector CT imaging of the chest was performed using the standard protocol during bolus administration of intravenous contrast. Multiplanar CT image reconstructions and MIPs were obtained to evaluate the vascular anatomy. CONTRAST:  68mL OMNIPAQUE IOHEXOL 350 MG/ML SOLN COMPARISON:  None. FINDINGS: Cardiovascular: There is a optimal opacification of the pulmonary arteries. There is no central,segmental, or subsegmental filling defects within the pulmonary arteries. The heart is normal in size. No pericardial effusion or thickening. No evidence right heart strain. There is normal three-vessel brachiocephalic anatomy without proximal stenosis. The thoracic aorta is normal in appearance. Mediastinum/Nodes: No hilar, mediastinal, or axillary adenopathy. Thyroid gland, trachea, and esophagus demonstrate no significant findings. Lungs/Pleura: Multifocal patchy ground-glass opacities are seen predominantly within the periphery of both lungs. No pleural effusion or pneumothorax is seen. Upper Abdomen: No acute abnormalities present in the visualized portions of the upper abdomen. Musculoskeletal: No chest wall abnormality. No acute or significant osseous  findings. Review of the MIP images confirms the above findings. Abdomen/pelvis: Hepatobiliary: There is diffuse low density seen throughout the liver parenchyma. The main portal vein is patent. No evidence of calcified gallstones, gallbladder wall  thickening or biliary dilatation. Pancreas: Unremarkable. No pancreatic ductal dilatation or surrounding inflammatory changes. Spleen: Normal in size without focal abnormality. Adrenals/Urinary Tract: Both adrenal glands appear normal. The kidneys and collecting system appear normal without evidence of urinary tract calculus or hydronephrosis. Bladder is unremarkable. Stomach/Bowel: The stomach, small bowel, and colon are normal in appearance. No inflammatory changes, wall thickening, or obstructive findings. There appears to be a surgical anastomosis at the sigmoid rectal junction. Mild presacral soft tissue density is again identified. Vascular/Lymphatic: There are no enlarged mesenteric, retroperitoneal, or pelvic lymph nodes. An infrarenal IVC filter is seen at approximately the L3 level. There is scattered aortic and bi-iliac calcifications noted. Reproductive: The uterus and adnexa are unremarkable. Other: No evidence of abdominal wall mass or hernia. Musculoskeletal: No acute or significant osseous findings. IMPRESSION: No central, segmental, or subsegmental pulmonary embolism. Multifocal patchy airspace opacities throughout both lungs, likely consistent with multifocal pneumonia. Hepatic steatosis Aortic Atherosclerosis (ICD10-I70.0). Electronically Signed   By: Prudencio Pair M.D.   On: 03/22/2020 00:07   DG Chest Port 1 View  Result Date: 03/21/2020 CLINICAL DATA:  54 year old female with weakness and hypotension. EXAM: PORTABLE CHEST 1 VIEW COMPARISON:  Chest radiograph dated 03/25/2016 FINDINGS: Faint bilateral peripheral and subpleural densities, right greater left concerning for pneumonia, possibly atypical or viral in etiology including COVID-19. Clinical  correlation is recommended. No focal consolidation, pleural effusion, pneumothorax. The cardiac silhouette is within limits. No acute osseous pathology. IMPRESSION: Findings concerning for pneumonia. Electronically Signed   By: Anner Crete M.D.   On: 03/21/2020 22:17    Pending Labs Unresulted Labs (From admission, onward)    Start     Ordered   03/22/20 0500  Lactic acid, plasma  Once,   STAT     03/22/20 0423   03/22/20 0109  Phosphorus  Daily,   R     03/22/20 0110   03/22/20 0109  Magnesium  Daily,   R     03/22/20 0110   03/22/20 0109  Ferritin  Daily,   R     03/22/20 0110   03/22/20 0109  D-dimer, quantitative (not at [facility])  Daily,   R     03/22/20 0110   03/22/20 0109  Comprehensive metabolic panel  Daily,   R     03/22/20 0110   03/22/20 0109  CBC with Differential/Platelet  Daily,   R     03/22/20 0110   03/22/20 0109  HIV Antibody (routine testing w rflx)  (HIV Antibody (Routine testing w reflex) panel)  Add-on,   AD     03/22/20 0110   03/21/20 2143  Blood Culture (routine x 2)  BLOOD CULTURE X 2,   STAT     03/21/20 2142   03/21/20 2143  Urine culture  ONCE - STAT,   STAT     03/21/20 2142          Vitals/Pain Today's Vitals   03/22/20 0428 03/22/20 0430 03/22/20 0435 03/22/20 0500  BP:  103/71  104/73  Pulse:  89  86  Resp:  16  16  Temp:      TempSrc:      SpO2:  100%  100%  PainSc: 8   8      Isolation Precautions Airborne and Contact precautions  Medications Medications  fentaNYL (SUBLIMAZE) injection 50 mcg (has no administration in time range)  0.9 %  sodium chloride infusion ( Intravenous Hold 03/22/20 0110)  insulin regular, human (MYXREDLIN) 100 units/  100 mL infusion (5.5 Units/hr Intravenous Rate/Dose Change 03/22/20 0525)  0.9 %  sodium chloride infusion ( Intravenous Stopped 03/22/20 0424)  dextrose 5 %-0.45 % sodium chloride infusion (1,000 mLs Intravenous New Bag/Given 03/22/20 0426)  dextrose 50 % solution 0-50 mL (has no administration  in time range)  heparin injection 5,000 Units (has no administration in time range)  sodium chloride flush (NS) 0.9 % injection 3 mL (has no administration in time range)  remdesivir 200 mg in sodium chloride 0.9% 250 mL IVPB (0 mg Intravenous Stopped 03/22/20 0420)    Followed by  remdesivir 100 mg in sodium chloride 0.9 % 100 mL IVPB (has no administration in time range)  Ipratropium-Albuterol (COMBIVENT) respimat 1 puff (has no administration in time range)  dexamethasone (DECADRON) injection 6 mg (6 mg Intravenous Given 03/22/20 0310)  guaiFENesin-dextromethorphan (ROBITUSSIN DM) 100-10 MG/5ML syrup 10 mL (has no administration in time range)  chlorpheniramine-HYDROcodone (TUSSIONEX) 10-8 MG/5ML suspension 5 mL (has no administration in time range)  ascorbic acid (VITAMIN C) tablet 500 mg (has no administration in time range)  zinc sulfate capsule 220 mg (has no administration in time range)  acetaminophen (TYLENOL) tablet 1,000 mg (has no administration in time range)  oxyCODONE (Oxy IR/ROXICODONE) immediate release tablet 10 mg (has no administration in time range)  HYDROmorphone (DILAUDID) injection 1 mg (1 mg Intravenous Given 03/22/20 0129)  senna-docusate (Senokot-S) tablet 1 tablet (has no administration in time range)  ondansetron (ZOFRAN) tablet 4 mg (has no administration in time range)    Or  ondansetron (ZOFRAN) injection 4 mg (has no administration in time range)  azithromycin (ZITHROMAX) 500 mg in sodium chloride 0.9 % 250 mL IVPB (has no administration in time range)  cefTRIAXone (ROCEPHIN) 1 g in sodium chloride 0.9 % 100 mL IVPB (has no administration in time range)  levothyroxine (SYNTHROID) tablet 100 mcg (has no administration in time range)  simvastatin (ZOCOR) tablet 10 mg (has no administration in time range)  sodium chloride 0.9 % bolus 2,000 mL (0 mLs Intravenous Stopped 03/22/20 0421)  fentaNYL (SUBLIMAZE) injection 25 mcg (25 mcg Intravenous Given 03/21/20 2215)   azithromycin (ZITHROMAX) 500 mg in sodium chloride 0.9 % 250 mL IVPB (0 mg Intravenous Stopped 03/22/20 0129)  ketamine 50 mg in normal saline 5 mL (10 mg/mL) syringe (20 mg Intravenous Given 03/21/20 2333)  sodium chloride 0.9 % bolus 1,000 mL (0 mLs Intravenous Stopped 03/22/20 0421)  iohexol (OMNIPAQUE) 350 MG/ML injection 75 mL (75 mLs Intravenous Contrast Given 03/21/20 2350)  cefTRIAXone (ROCEPHIN) 1 g in sodium chloride 0.9 % 100 mL IVPB (0 g Intravenous Stopped 03/22/20 0103)  sodium zirconium cyclosilicate (LOKELMA) packet 10 g (10 g Oral Given 03/22/20 0308)  sodium chloride 0.9 % bolus 1,000 mL (0 mLs Intravenous Stopped 03/22/20 0421)    Mobility walks     Focused Assessments Pulmonary Assessment Handoff:  Lung sounds:  clear bilateral          R Recommendations: See Admitting Provider Note  Report given to:   Additional Notes: Pt is A&Ox 4; continues to c/o severe bilateral leg pain/weakness; Pain meds administered prior to tranfer to floor; MRI to rescheueled due to patient being on contact for Covid; Pt has Fentanyl order that should be given for support when getting MRI; Combivent will be transported with patient to floor-Monique,RN

## 2020-03-22 NOTE — ED Notes (Signed)
CBG 187 

## 2020-03-22 NOTE — Progress Notes (Addendum)
Inpatient Diabetes Program Recommendations  AACE/ADA: New Consensus Statement on Inpatient Glycemic Control (2015)  Target Ranges:  Prepandial:   less than 140 mg/dL      Peak postprandial:   less than 180 mg/dL (1-2 hours)      Critically ill patients:  140 - 180 mg/dL   Lab Results  Component Value Date   GLUCAP 169 (H) 03/22/2020   HGBA1C (H) 12/23/2010    6.5 (NOTE)                                                                       According to the ADA Clinical Practice Recommendations for 2011, when HbA1c is used as a screening test:   >=6.5%   Diagnostic of Diabetes Mellitus           (if abnormal result  is confirmed)  5.7-6.4%   Increased risk of developing Diabetes Mellitus  References:Diagnosis and Classification of Diabetes Mellitus,Diabetes [id number] 1):S62-S69 and Standards of Medical Care in         Diabetes - 2011,Diabetes Care,2011,34  (Suppl 1):S11-S61.   Results for [name], [name] (MRN [id number]) as of 03/22/2020 10:09  Ref. Range 03/22/2020 02:41  CO2 Latest Ref Range: 22 - 32 mmol/L 11 (L)  Glucose Latest Ref Range: 70 - 99 mg/dL 288 (H)  BUN Latest Ref Range: 6 - 20 mg/dL 26 (H)  Creatinine Latest Ref Range: 0.44 - 1.00 mg/dL 1.24 (H)  Calcium Latest Ref Range: 8.9 - 10.3 mg/dL 8.0 (L)  Anion gap Latest Ref Range: 5 - 15  16 (H)  Results for [name], [name] (MRN [id number]) as of 03/22/2020 10:09  Ref. Range 03/22/2020 07:34 03/22/2020 08:38  Glucose-Capillary Latest Ref Range: 70 - 99 mg/dL 161 (H) 169 (H)    Diabetes history: Type 2 DM Outpatient Diabetes medications: Jardiance 25 mg QD, Humalog BID Current orders for Inpatient glycemic control: Iv insulin  Decadron 6 mg QD  Inpatient Diabetes Program Recommendations:    Last noted A1C was from 2012, consider repeating A1C?  Also, add BMETs for Q4H and beta hydroxybutyric acid Q8H.   Thanks, Bronson Curb, MSN, RNC-OB Diabetes Coordinator [phone] (8a-5p)

## 2020-03-22 NOTE — ED Notes (Signed)
SDU  Breakfast ordered  

## 2020-03-22 NOTE — Progress Notes (Signed)
ANTICOAGULATION CONSULT NOTE - Follow Up Consult  Pharmacy Consult for Heparin Indication: rule out DVT, D-dimer > 20, Covid  Allergies  Allergen Reactions  . Metformin And Related Other (See Comments)    Chest pain  . Penicillins Hives    Patient Measurements: Height: 5\' 11"  (180.3 cm) Weight: 94.2 kg (207 lb 10.8 oz) IBW/kg (Calculated) : 70.8   Vital Signs: Temp: 98.2 F (36.8 C) (06/02 1200) Temp Source: Oral (06/02 1200) BP: 110/78 (06/02 1200) Pulse Rate: 91 (06/02 1200)  Labs: Recent Labs    03/21/20 2143 03/21/20 2143 03/21/20 2235 03/21/20 2336 03/22/20 0241  HGB 13.3   < > 13.6  --  12.6  HCT 41.8  --  40.0  --  40.6  PLT 270  --   --   --  208  APTT 52*  --   --   --   --   LABPROT 17.3*  --   --   --   --   INR 1.5*  --   --   --   --   CREATININE 1.43*  --  1.20*  --  1.24*  CKTOTAL  --   --   --  109  --   TROPONINIHS <2  --   --   --   --    < > = values in this interval not displayed.    Estimated Creatinine Clearance: 66.4 mL/min (A) (by C-G formula based on SCr of 1.24 mg/dL (H)).  Assessment: 54 year old female Covid + to begin heparin for rule out DVT and d-dimer > 20 (CT negative for PE)  Goal of Therapy:  Heparin level 0.3-0.7 units/ml Monitor platelets by anticoagulation protocol: Yes   Plan:  Heparin 2000 units iv bolus x 1 (just received sq heparin) Heparin drip at 1300 units / hr Heparin level 6 hours after heparin begins Daily heparin level, CBC  Thank you Anette Guarneri, PharmD 03/22/2020,1:08 PM

## 2020-03-22 NOTE — ED Notes (Signed)
Lactic and HIV tube sent down during downtime

## 2020-03-22 NOTE — Progress Notes (Addendum)
ANTICOAGULATION CONSULT NOTE - Follow Up Consult  Pharmacy Consult for Heparin Indication: rule out DVT, D-dimer > 20, Covid  Allergies  Allergen Reactions  . Metformin And Related Other (See Comments)    Chest pain  . Penicillins Hives    Patient Measurements: Height: 5\' 11"  (180.3 cm) Weight: 94.2 kg (207 lb 10.8 oz) IBW/kg (Calculated) : 70.8   Vital Signs: Temp: 98.7 F (37.1 C) (06/02 2010) Temp Source: Oral (06/02 2010) BP: 128/77 (06/02 2010) Pulse Rate: 88 (06/02 2010)  Labs: Recent Labs    03/21/20 2143 03/21/20 2143 03/21/20 2235 03/21/20 2336 03/22/20 0241 03/22/20 2219  HGB 13.3   < > 13.6  --  12.6  --   HCT 41.8  --  40.0  --  40.6  --   PLT 270  --   --   --  208  --   APTT 52*  --   --   --   --   --   LABPROT 17.3*  --   --   --   --   --   INR 1.5*  --   --   --   --   --   HEPARINUNFRC  --   --   --   --   --  1.00*  CREATININE 1.43*  --  1.20*  --  1.24*  --   CKTOTAL  --   --   --  109  --   --   TROPONINIHS <2  --   --   --   --   --    < > = values in this interval not displayed.    Estimated Creatinine Clearance: 66.4 mL/min (A) (by C-G formula based on SCr of 1.24 mg/dL (H)).  Assessment: 54 year old female Covid + to begin heparin for rule out DVT and d-dimer > 20 (CT negative for PE).   Initial heparin level is SUPRA-therapeutic at 1 on 1300 units/hr after initial bolus. Level drawn from opposite arm.   Goal of Therapy:  Heparin level 0.3-0.7 units/ml Monitor platelets by anticoagulation protocol: Yes   Plan:  Hold Heparin for 1 hour.  Reduce Heparin drip at 950 units / hr Heparin level 6 hours after heparin begins Daily heparin level, CBC  Sloan Leiter, PharmD, BCPS, BCCCP Clinical Pharmacist Please refer to [facility] for [place] numbers 03/22/2020,10:44 PM

## 2020-03-22 NOTE — Progress Notes (Signed)
There was concern that patient may require ICU rather than progressive unit and patient was evaluated for that reason.   On exam, patient is alert and fully oriented, and is not tachypneic or in any respiratory distress. SBP has been in low 100s, HR and RR have been normal.   She does not appear to require ICU at this time.

## 2020-03-22 NOTE — H&P (Signed)
History and Physical    [name] VHQ:[id number] DOB: [birth year] DOA: 03/21/2020  PCP: Glendale Chard, MD  Patient coming from: Home  I have personally briefly reviewed patient's old medical records in [place]  Chief Complaint: Back pain and leg weakness  HPI: [name] is a 54 y.o. female with medical history significant for type 2 diabetes, hypothyroidism, hyperlipidemia, history of PE, and history of colon cancer who presents to the ED for evaluation of new onset low back pain with lower extremity weakness and change in sensation.  Patient was recently diagnosed with COVID-19 on 03/10/2020.  She did not require hospitalization or specific treatment at that time however has been having progressive fatigue and malaise since then.  She was given antibiotics for possible UTI earlier today.  She says earlier 03/22/2019 when she developed significant lower back pain and weakness in both of her legs.  She has been having shooting pain from her thighs down to her both of her feet.  She has had decreased sensation on the inside of her legs.  She denies any urinary incontinence.  She reports chronic intermittent incontinence of bowel due to her prior history of colon cancer.  Pain has been progressively worsening and she is now having difficulty walking.  She came to the ED for further evaluation.  She denies any chest pain but has some feeling that her heart is racing.  She denies any shortness of breath or cough.  She has had nausea with an episode of vomiting prior to arrival.  She reports some abdominal pain.  She has had chills but denies subjective fevers.  ED Course:  Initial vitals showed BP 93/76, pulse 76, RR 15, temp 97.0 Fahrenheit rectally, SPO2 90% on room air.  Labs show sodium 136, potassium 5.9 (may be hemolyzed sample), bicarb 15, BUN 31, creatinine 1.43, serum glucose 368, AST 31, ALT 23, alk phos 74, total bilirubin 1.8, WBC 15.2, hemoglobin 13.3, platelets 27,000,  high-sensitivity troponin I undetectable BNP 29.9.  D-dimer >20.  I-STAT beta-hCG undetectable, lactic acid 5.4.  Repeat potassium was 5.5, CK 109.  Urinalysis is ordered and pending.  Blood cultures were obtained and pending.VBG showed pH 7.303, PCO2 33.3, PO2 126.  CTA chest PE study and CT abdomen/pelvis with contrast is negative for PE.  Focal patchy airspace opacities are seen throughout both lungs.  Hepatic steatosis is noted.  Patient was given 3 L normal saline, placed on insulin infusion, given IV ceftriaxone and azithromycin, fentanyl, ketamine.  MRI lumbar and thoracic spines were ordered and pending.  Hospitalist service was consulted admit for further evaluation and management.  Review of Systems: All systems reviewed and are negative except as documented in history of present illness above.   Past Medical History:  Diagnosis Date  . 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  ECC with HPV effect  . 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 Diabetes mellitus without complication ([place])   . Headache(784.0)    experiences migraine every couple of weeks. takes only excedrin  . 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  colposcopy adequate normal 10/2015   . Hyperlipidemia   . Pulmonary embolism ([place])   . STD (sexually transmitted disease) 1991   POS GC-TREATED  . Thyroid disease     Past Surgical History:  Procedure Laterality Date  . CESAREAN SECTION  2005  . COLON SURGERY    . INSERTION OF VENA CAVA FILTER Right 2012  . none    . PORT-A-CATH REMOVAL Right 12/22/2012   Procedure: REMOVAL PORT-A-CATH;  Surgeon: Odis Hollingshead, MD;  Location: [facility];  Service: General;  Laterality: Right;  . PORTACATH PLACEMENT  02/21/11   tip in mid [id number]-[id number]  . 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 She has never used smokeless tobacco. She reports current alcohol use. She reports that she does not use drugs.  Allergies  Allergen Reactions  . Metformin And Related Other (See Comments)    Chest pain  . Penicillins Hives    Family History  Problem Relation Age of Onset  . Heart disease Father   . Lung cancer Mother   . Brain cancer Mother   . Diabetes Mother   . Hypertension Mother   . Diabetes Brother   . Kidney disease Brother   . Cervical cancer Paternal Grandmother   . Breast cancer Neg Hx      Prior to Admission medications   Medication Sig Start Date End Date Taking? Authorizing Provider  aspirin 81 MG tablet Take 81 mg by mouth daily.    [provider]  aspirin-acetaminophen-caffeine (EXCEDRIN MIGRAINE) [phone] MG per tablet Take 2 tablets by mouth every 6 (six) hours as needed. For migraines    [provider]  benzonatate (TESSALON) 100 MG capsule Take 1 capsule (100 mg total) by mouth every 8 (eight) hours. 03/10/20   Avegno, Darrelyn Hillock, FNP  cholecalciferol (VITAMIN D) 400 UNITS TABS tablet Take 400 Units by mouth.    [provider]  cyanocobalamin 500 MCG tablet Take 500 mcg by mouth daily.    [provider]  ibuprofen (ADVIL) 600 MG tablet Take 1 tablet (600 mg total) by mouth every 8 (eight) hours as needed. 01/18/20   [name], NP  Insulin Lispro (HUMALOG [place]) Inject into the skin 2 (two) times daily.    [provider]  JARDIANCE 25 MG TABS tablet Take 25 mg by mouth daily. 01/01/20   [provider]  levothyroxine (SYNTHROID, LEVOTHROID) 100 MCG tablet  07/13/15   [provider]  Loperamide HCl (IMODIUM PO) Take by mouth.    [provider]  Multiple Vitamin (MULTIVITAMIN) tablet Take 1 tablet by mouth daily.    [provider]  ondansetron (ZOFRAN) 4 MG tablet Take 1 tablet (4 mg total) by mouth every 8 (eight) hours as needed for nausea or  vomiting. 03/10/20   Avegno, Darrelyn Hillock, FNP  pyridOXINE (VITAMIN B-6) 100 MG tablet Take 100 mg by mouth 2 (two) times daily.    [provider]  simvastatin (ZOCOR) 20 MG tablet Take 0.5 tablets (10 mg total) by mouth daily. 12/04/18   [name], FNP  POTASSIUM CHLORIDE PO Take 20 mEq by mouth daily.   12/31/11  [provider]    Physical Exam: Vitals:   03/22/20 0010 03/22/20 0015 03/22/20 0024 03/22/20 0030  BP:    113/70  Pulse: 92   85  Resp: 15   15  Temp:  (!) 97 F (36.1 C) (!) 97 F (36.1 C)   TempSrc:  Rectal Rectal   SpO2: 100%   100%   Constitutional: Resting supine in bed, NAD, calm, comfortable Eyes: PERRL, EOMI, lids and conjunctivae normal ENMT: Mucous membranes are moist. Posterior pharynx clear of  any exudate or lesions.Normal dentition.  Neck: normal, supple, no masses. Respiratory: clear to auscultation bilaterally, no wheezing, no crackles. Normal respiratory effort. No accessory muscle use.  Cardiovascular: Regular rate and rhythm, no murmurs / rubs / gallops. No extremity edema. 2+ pedal pulses. Abdomen: no tenderness, no masses palpated. No hepatosplenomegaly. Bowel sounds positive.  Musculoskeletal: no clubbing / cyanosis. No joint deformity upper and lower extremities. Good ROM, no contractures. Normal muscle tone.  Skin: no rashes, lesions, ulcers. No induration Neurologic: CN 2-12 grossly intact. Sensation diminished medial lower extremities below the knees bilaterally otherwise intact. Strength 5/5 in both upper extremities 4/5 bilateral lower extremities. Psychiatric: Normal judgment and insight. Alert and oriented x 3. Normal mood.   Labs on Admission: I have personally reviewed following labs and imaging studies  CBC: Recent Labs  Lab 03/21/20 2143 03/21/20 2235  WBC 15.2*  --   NEUTROABS 13.2*  --   HGB 13.3 13.6  HCT 41.8 40.0  MCV 93.3  --   PLT 270  --    Basic Metabolic Panel: Recent Labs  Lab 03/21/20 2143  03/21/20 2235 03/21/20 2336  NA 136 137  --   K 5.9* 4.5 5.5*  CL 105  --   --   CO2 15*  --   --   GLUCOSE 368*  --   --   BUN 31*  --   --   CREATININE 1.43* 1.20*  --   CALCIUM 8.7*  --   --    GFR: CrCl cannot be calculated (Unknown ideal weight.). Liver Function Tests: Recent Labs  Lab 03/21/20 2143  AST 31  ALT 23  ALKPHOS 74  BILITOT 1.8*  PROT 6.9  ALBUMIN 3.4*   No results for input(s): LIPASE, AMYLASE in the last 168 hours. No results for input(s): AMMONIA in the last 168 hours. Coagulation Profile: Recent Labs  Lab 03/21/20 2143  INR 1.5*   Cardiac Enzymes: Recent Labs  Lab 03/21/20 2336  CKTOTAL 109   BNP (last 3 results) No results for input(s): PROBNP in the last 8760 hours. HbA1C: No results for input(s): HGBA1C in the last 72 hours. CBG: No results for input(s): GLUCAP in the last 168 hours. Lipid Profile: No results for input(s): CHOL, HDL, LDLCALC, TRIG, CHOLHDL, LDLDIRECT in the last 72 hours. Thyroid Function Tests: No results for input(s): TSH, T4TOTAL, FREET4, T3FREE, THYROIDAB in the last 72 hours. Anemia Panel: No results for input(s): VITAMINB12, FOLATE, FERRITIN, TIBC, IRON, RETICCTPCT in the last 72 hours. Urine analysis:    Component Value Date/Time   COLORURINE YELLOW 02/15/2020 0957   APPEARANCEUR CLEAR 02/15/2020 0957   LABSPEC 1.043 (H) 02/15/2020 0957   LABSPEC 1.025 02/10/2017 1634   PHURINE < OR = 5.0 02/15/2020 0957   GLUCOSEU 3+ (A) 02/15/2020 0957   GLUCOSEU 1,000 02/10/2017 1634   HGBUR NEGATIVE 02/15/2020 0957   BILIRUBINUR Negative 02/10/2017 1634   KETONESUR NEGATIVE 02/15/2020 0957   PROTEINUR NEGATIVE 02/15/2020 0957   UROBILINOGEN 0.2 02/10/2017 1634   NITRITE Positive 02/10/2017 1634   NITRITE NEGATIVE 09/12/2015 1542   LEUKOCYTESUR Trace 02/10/2017 1634    Radiological Exams on Admission: CT Angio Chest PE W and/or Wo Contrast  Result Date: 03/22/2020 CLINICAL DATA:  Severe back pain hypotension  and shallow EXAM: CT ANGIOGRAPHY CHEST WITH CONTRAST TECHNIQUE: Multidetector CT imaging of the chest was performed using the standard protocol during bolus administration of intravenous contrast. Multiplanar CT image reconstructions and MIPs were obtained to evaluate the vascular  anatomy. CONTRAST:  15m OMNIPAQUE IOHEXOL 350 MG/ML SOLN COMPARISON:  None. FINDINGS: Cardiovascular: There is a optimal opacification of the pulmonary arteries. There is no central,segmental, or subsegmental filling defects within the pulmonary arteries. The heart is normal in size. No pericardial effusion or thickening. No evidence right heart strain. There is normal three-vessel brachiocephalic anatomy without proximal stenosis. The thoracic aorta is normal in appearance. Mediastinum/Nodes: No hilar, mediastinal, or axillary adenopathy. Thyroid gland, trachea, and esophagus demonstrate no significant findings. Lungs/Pleura: Multifocal patchy ground-glass opacities are seen predominantly within the periphery of both lungs. No pleural effusion or pneumothorax is seen. Upper Abdomen: No acute abnormalities present in the visualized portions of the upper abdomen. Musculoskeletal: No chest wall abnormality. No acute or significant osseous findings. Review of the MIP images confirms the above findings. Abdomen/pelvis: Hepatobiliary: There is diffuse low density seen throughout the liver parenchyma. The main portal vein is patent. No evidence of calcified gallstones, gallbladder wall thickening or biliary dilatation. Pancreas: Unremarkable. No pancreatic ductal dilatation or surrounding inflammatory changes. Spleen: Normal in size without focal abnormality. Adrenals/Urinary Tract: Both adrenal glands appear normal. The kidneys and collecting system appear normal without evidence of urinary tract calculus or hydronephrosis. Bladder is unremarkable. Stomach/Bowel: The stomach, small bowel, and colon are normal in appearance. No inflammatory  changes, wall thickening, or obstructive findings. There appears to be a surgical anastomosis at the sigmoid rectal junction. Mild presacral soft tissue density is again identified. Vascular/Lymphatic: There are no enlarged mesenteric, retroperitoneal, or pelvic lymph nodes. An infrarenal IVC filter is seen at approximately the L3 level. There is scattered aortic and bi-iliac calcifications noted. Reproductive: The uterus and adnexa are unremarkable. Other: No evidence of abdominal wall mass or hernia. Musculoskeletal: No acute or significant osseous findings. IMPRESSION: No central, segmental, or subsegmental pulmonary embolism. Multifocal patchy airspace opacities throughout both lungs, likely consistent with multifocal pneumonia. Hepatic steatosis Aortic Atherosclerosis (ICD10-I70.0). Electronically Signed   By: BPrudencio PairM.D.   On: 03/22/2020 00:07   CT ABDOMEN PELVIS W CONTRAST  Result Date: 03/22/2020 CLINICAL DATA:  Severe back pain hypotension and shallow EXAM: CT ANGIOGRAPHY CHEST WITH CONTRAST TECHNIQUE: Multidetector CT imaging of the chest was performed using the standard protocol during bolus administration of intravenous contrast. Multiplanar CT image reconstructions and MIPs were obtained to evaluate the vascular anatomy. CONTRAST:  774mOMNIPAQUE IOHEXOL 350 MG/ML SOLN COMPARISON:  None. FINDINGS: Cardiovascular: There is a optimal opacification of the pulmonary arteries. There is no central,segmental, or subsegmental filling defects within the pulmonary arteries. The heart is normal in size. No pericardial effusion or thickening. No evidence right heart strain. There is normal three-vessel brachiocephalic anatomy without proximal stenosis. The thoracic aorta is normal in appearance. Mediastinum/Nodes: No hilar, mediastinal, or axillary adenopathy. Thyroid gland, trachea, and esophagus demonstrate no significant findings. Lungs/Pleura: Multifocal patchy ground-glass opacities are seen  predominantly within the periphery of both lungs. No pleural effusion or pneumothorax is seen. Upper Abdomen: No acute abnormalities present in the visualized portions of the upper abdomen. Musculoskeletal: No chest wall abnormality. No acute or significant osseous findings. Review of the MIP images confirms the above findings. Abdomen/pelvis: Hepatobiliary: There is diffuse low density seen throughout the liver parenchyma. The main portal vein is patent. No evidence of calcified gallstones, gallbladder wall thickening or biliary dilatation. Pancreas: Unremarkable. No pancreatic ductal dilatation or surrounding inflammatory changes. Spleen: Normal in size without focal abnormality. Adrenals/Urinary Tract: Both adrenal glands appear normal. The kidneys and collecting system appear normal without evidence of  urinary tract calculus or hydronephrosis. Bladder is unremarkable. Stomach/Bowel: The stomach, small bowel, and colon are normal in appearance. No inflammatory changes, wall thickening, or obstructive findings. There appears to be a surgical anastomosis at the sigmoid rectal junction. Mild presacral soft tissue density is again identified. Vascular/Lymphatic: There are no enlarged mesenteric, retroperitoneal, or pelvic lymph nodes. An infrarenal IVC filter is seen at approximately the L3 level. There is scattered aortic and bi-iliac calcifications noted. Reproductive: The uterus and adnexa are unremarkable. Other: No evidence of abdominal wall mass or hernia. Musculoskeletal: No acute or significant osseous findings. IMPRESSION: No central, segmental, or subsegmental pulmonary embolism. Multifocal patchy airspace opacities throughout both lungs, likely consistent with multifocal pneumonia. Hepatic steatosis Aortic Atherosclerosis (ICD10-I70.0). Electronically Signed   By: Prudencio Pair M.D.   On: 03/22/2020 00:07   DG Chest Port 1 View  Result Date: 03/21/2020 CLINICAL DATA:  54 year old female with weakness and  hypotension. EXAM: PORTABLE CHEST 1 VIEW COMPARISON:  Chest radiograph dated 03/25/2016 FINDINGS: Faint bilateral peripheral and subpleural densities, right greater left concerning for pneumonia, possibly atypical or viral in etiology including COVID-19. Clinical correlation is recommended. No focal consolidation, pleural effusion, pneumothorax. The cardiac silhouette is within limits. No acute osseous pathology. IMPRESSION: Findings concerning for pneumonia. Electronically Signed   By: Anner Crete M.D.   On: 03/21/2020 22:17    EKG: Independently reviewed. Sinus rhythm, early R wave transition, QTC 498.  QTc longer compared to prior.  Assessment/Plan Principal Problem:   Sepsis due to COVID-19 [facility]) Active Problems:   Hyperlipidemia   Hypothyroidism   Type 2 diabetes mellitus without complication, without long-term current use of insulin ([place])     Sepsis due to COVID-19 pneumonitis: SARS-CoV-2 PCR positive on 03/11/2019.  Has been having progressive fatigue, weakness.  CT imaging shows multifocal infiltrates. -Start IV remdesivir per pharmacy protocol-IV Decadron -Continue ceftriaxone and azithromycin for possible superimposed bacterial pneumonia -Repeat lactic acid -Continue IV fluids -Follow blood and urine cultures -Supplemental oxygen as needed -Continue I-S, flutter valve, Combivent -Continue vitamin C, zinc, antitussives  Low back pain with weakness and numbness: Reported you acute onset low back pain with weakness and numbness.  Denies urinary incontinence, reports chronic bowel incontinence from prior colon cancer.  She does have decreased sensation medial lower extremities below the knees.  Strength slightly diminished bilateral lower extremities.. -Follow MRI thoracic and lumbar spine -Analgesics as needed with hold parameters  Acute kidney injury: Likely secondary to sepsis.  Continue IV fluids and follow labs.  Hyperglycemia and type 2 diabetes: Appears to be DKA  with elevated beta hydroxy butyrate, anion gap, and metabolic acidosis. -Continue insulin infusion per protocol -Transition IV NS to D5-1/2 NS per protocol  Hyperkalemia: K5.5 in setting of AKI.  Give Lokelma 10 mg once.  Expect further improvement with insulin infusion and fluids.  Hypothyroidism: Continue Synthroid.  Check TSH.  Hyperlipidemia: Continue simvastatin.  DVT prophylaxis: Subcutaneous heparin Code Status: Full code, confirmed with patient Family Communication: Discussed with patient, she has discussed with her family Disposition Plan: From home, disposition pending further low back pain work-up Consults called: None Admission status:  Status is: Inpatient  Remains inpatient appropriate because:Hemodynamically unstable, Ongoing active pain requiring inpatient pain management, Ongoing diagnostic testing needed not appropriate for outpatient work up, Unsafe d/c plan, IV treatments appropriate due to intensity of illness or inability to take PO and Inpatient level of care appropriate due to severity of illness   Dispo: The patient is from: Home  Anticipated d/c is to: TBD              Anticipated d/c date is: 3 days              Patient currently is not medically stable to d/c.          Zada Finders MD [facility]  If 7PM-7AM, please contact night-coverage [email or web address]  03/22/2020, 1:27 AM

## 2020-03-22 NOTE — ED Notes (Signed)
CBG 332 

## 2020-03-22 NOTE — Progress Notes (Signed)
PROGRESS NOTE                                                                                                                                                                                                             Patient Demographics:    [name], is a 54 y.o. female, DOB - [birth year], UYQ:[id number]  Admit date - 03/21/2020   Admitting Physician Lenore Cordia, MD  Outpatient Primary MD for the patient is Glendale Chard, MD  LOS - 0   Chief Complaint  Patient presents with  . Hypotension  . Back Pain       Brief Narrative    This is a no charge note as patient was seen and admitted earlier today by Dr. Posey Pronto, chart, imaging and labs were reviewed.  HPI: [name] is a 54 y.o. female with medical history significant for type 2 diabetes, hypothyroidism, hyperlipidemia, history of PE, and history of colon cancer who presents to the ED for evaluation of new onset low back pain with lower extremity weakness and change in sensation.  Patient was recently diagnosed with COVID-19 on 03/10/2020.  She did not require hospitalization or specific treatment at that time however has been having progressive fatigue and malaise since then.  She was given antibiotics for possible UTI earlier today.  She says earlier 03/22/2019 when she developed significant lower back pain and weakness in both of her legs.  She has been having shooting pain from her thighs down to her both of her feet.  She has had decreased sensation on the inside of her legs.  She denies any urinary incontinence.  She reports chronic intermittent incontinence of bowel due to her prior history of colon cancer.  Pain has been progressively worsening and she is now having difficulty walking.  She came to the ED for further evaluation.  She denies any chest pain but has some feeling that her heart is racing.  She denies any shortness of breath or cough.  She has had nausea with an episode of  vomiting prior to arrival.  She reports some abdominal pain.  She has had chills but denies subjective fevers.  ED Course:  Initial vitals showed BP 93/76, pulse 76, RR 15, temp 97.0 Fahrenheit rectally, SPO2 90% on room air.  Labs show sodium 136, potassium 5.9 (may be hemolyzed  sample), bicarb 15, BUN 31, creatinine 1.43, serum glucose 368, AST 31, ALT 23, alk phos 74, total bilirubin 1.8, WBC 15.2, hemoglobin 13.3, platelets 27,000, high-sensitivity troponin I undetectable BNP 29.9.  D-dimer >20.  I-STAT beta-hCG undetectable, lactic acid 5.4.  Repeat potassium was 5.5, CK 109.  Urinalysis is ordered and pending.  Blood cultures were obtained and pending.VBG showed pH 7.303, PCO2 33.3, PO2 126.  CTA chest PE study and CT abdomen/pelvis with contrast is negative for PE.  Focal patchy airspace opacities are seen throughout both lungs.  Hepatic steatosis is noted.  Patient was given 3 L normal saline, placed on insulin infusion, given IV ceftriaxone and azithromycin, fentanyl, ketamine.  Hospitalist service was consulted admit for further evaluation and management.     Subjective:    Tam Savoia today reports some cough, denies any chest pain, reports some dyspnea, still complains of lower extremity pain, weakness which appears to be more secondary to pain than actual focal deficits .    Assessment  & Plan :    Principal Problem:   Sepsis due to COVID-19 [facility]) Active Problems:   Hyperlipidemia   Hypothyroidism   Type 2 diabetes mellitus without complication, without long-term current use of insulin ([facility])     Sepsis due to COVID-19 pneumonitis - SARS-CoV-2 PCR positive on 03/11/2019.  Has been having progressive fatigue, weakness.  CT imaging shows multifocal infiltrates. -Patient with no hypoxia, but there is imaging significant for multifocal opacity, so she will be treated with IV steroids and Decadron . -Given sepsis present on admission with elevated lactic acid, will  continue with IV Rocephin and azithromycin . 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  Continue IV fluids and follow labs.  Hyperglycemia and type 2 diabetes with mild DKA : -She is initially on insulin drip, this has been stopped. -Continue with Lantus 8 units, and insulin sliding scale. -Patient with low bicarb, mildly elevated anion gap, will start on bicarb drip.  Hyperkalemia: K5.5 in setting of AKI.  Give Lokelma 10 mg once.    Resolved with hydration  Hypothyroidism: Continue Synthroid.    Hyperlipidemia: Continue simvastatin.    COVID-19 Labs  Recent Labs    03/21/20 2143 03/22/20 0241 03/22/20 0814  DDIMER >20.00* >20.00*  --   FERRITIN  --  254  --   CRP  --   --  6.6*    Lab Results  Component Value Date   SARSCOV2NAA POSITIVE (A) 03/10/2020     Code Status : Full   Family Communication  :  D/W patient  Disposition Plan  :  Status is: Inpatient  Remains inpatient appropriate because:IV treatments appropriate due to intensity of illness or inability to take PO   Dispo: The patient is from: Home              Anticipated d/c is to: Home              Anticipated d/c date is: 3 days              Patient currently is not medically stable to d/c.      Consults  :  Vascular Surgery  Procedures  : None  DVT Prophylaxis  :  Heparin GTT  Lab Results  Component Value Date   PLT 208 03/22/2020    Antibiotics  :    Anti-infectives (From admission, onward)   Start     Dose/Rate Route Frequency Ordered  Stop   03/23/20 1000  remdesivir 100 mg in sodium chloride 0.9 % 100 mL IVPB     100 mg 200 mL/hr over 30 Minutes Intravenous Daily 03/22/20 0110 03/27/20 0959   03/22/20 2200  azithromycin (ZITHROMAX) 500 mg in sodium chloride 0.9 % 250 mL IVPB     500 mg 250 mL/hr over 60 Minutes Intravenous Every 24 hours 03/22/20 0111     03/22/20 2200  cefTRIAXone (ROCEPHIN) 1 g in sodium chloride 0.9 % 100 mL IVPB     1 g 200 mL/hr over 30 Minutes Intravenous Every 24 hours 03/22/20 0111     03/22/20 0200  remdesivir 200 mg in sodium chloride 0.9% 250 mL IVPB     200 mg 580 mL/hr over 30 Minutes Intravenous Once 03/22/20 0110 03/22/20 0420   03/22/20 0015  cefTRIAXone (ROCEPHIN) 1 g in sodium chloride 0.9 % 100 mL IVPB     1 g 200 mL/hr over 30 Minutes Intravenous  Once 03/22/20 0010 03/22/20 0103   03/21/20 2300  cefTRIAXone (ROCEPHIN) injection 1 g  Status:  Discontinued     1 g Intramuscular  Once 03/21/20 2253 03/22/20 0010   03/21/20 2300  azithromycin (ZITHROMAX) 500 mg in sodium chloride 0.9 % 250 mL IVPB     500 mg 250 mL/hr over 60 Minutes Intravenous  Once 03/21/20 2253 03/22/20 0129        Objective:   Vitals:   03/22/20 0711 03/22/20 0724 03/22/20 0800 03/22/20 1200  BP: 113/84 109/81 108/77 110/78  Pulse: 99 88 95 91  Resp: _0 Temp: 98.1 F (36.7 C)  98 F (36.7 C) 98.2 F (36.8 C)  TempSrc: Oral  Oral Oral  SpO2: 100% 96% 100% 97%  Weight: 94.2 kg     Height: _1  (1.803 m)       Wt Readings from Last 3 Encounters:  03/22/20 94.2 kg  02/15/20 91.2 kg  08/02/19 95.3 kg     Intake/Output Summary (Last 24 hours) at 03/22/2020 1353 Last data filed at 03/22/2020 0900 Gross per 24 hour  Intake 4370 ml  Output 675 ml  Net 3695 ml     Physical Exam  Awake Alert, Oriented X 3, No new F.N deficits, Normal affect Symmetrical Chest wall movement, Good air movement bilaterally, CTAB RRR,No Gallops,Rubs or new Murmurs, No Parasternal Heave +ve B.Sounds, Abd  Soft, No tenderness, No organomegaly appriciated, No rebound - guarding or rigidity. No Cyanosis, patient with lower extremity edema, patient motor strength limited secondary to pain, more than actual focal deficits    Data Review:    CBC Recent Labs  Lab 03/21/20 2143 03/21/20 2235 03/22/20 0241  WBC 15.2*  --  15.1*  HGB 13.3 13.6 12.6  HCT 41.8 40.0 40.6  PLT 270  --  208  MCV 93.3  --  95.1  MCH 29.7  --  29.5  MCHC 31.8  --  31.0  RDW 13.2  --  13.5  LYMPHSABS 1.3  --  0.8  MONOABS 0.6  --  0.3  EOSABS 0.0  --  0.0  BASOSABS 0.0  --  0.0    Chemistries  Recent Labs  Lab 03/21/20 2143 03/21/20 2235 03/21/20 2336 03/22/20 0241  NA 136 137  --  139  K 5.9* 4.5 5.5* 4.0  CL 105  --   --  112*  CO2 15*  --   --  11*  GLUCOSE 368*  --   --  288*  BUN 31*  --   --  26*  CREATININE 1.43* 1.20*  --  1.24*  CALCIUM 8.7*  --   --  8.0*  MG  --   --   --  1.5*  AST 31  --   --  17  ALT 23  --   --  19  ALKPHOS 74  --   --  77  BILITOT 1.8*  --   --  0.8   ------------------------------------------------------------------------------------------------------------------ No results for input(s): CHOL, HDL, LDLCALC, TRIG, CHOLHDL, LDLDIRECT in the last 72 hours.  Lab Results  Component Value Date   HGBA1C (H) 12/23/2010    6.5 (NOTE)                                                                       According to the ADA Clinical Practice Recommendations for 2011, when HbA1c is used as a screening test:   >=6.5%   Diagnostic of Diabetes Mellitus           (if abnormal result  is confirmed)  5.7-6.4%   Increased risk of developing Diabetes Mellitus  References:Diagnosis and Classification of Diabetes Mellitus,Diabetes EGBT,5176,16(WVPXT 1):S62-S69 and Standards of Medical Care in         Diabetes - 2011,Diabetes GGYI,9485,46  (Suppl 1):S11-S61.   ------------------------------------------------------------------------------------------------------------------ Recent  Labs    03/22/20 0241  TSH 10.445*   ------------------------------------------------------------------------------------------------------------------ Recent Labs    03/22/20 0241  FERRITIN 254    Coagulation profile Recent Labs  Lab 03/21/20 2143  INR 1.5*    Recent Labs    03/21/20 2143 03/22/20 0241  DDIMER >20.00* >20.00*    Cardiac Enzymes No results for input(s): CKMB, TROPONINI, MYOGLOBIN in the last 168 hours.  Invalid input(s): CK ------------------------------------------------------------------------------------------------------------------    Component Value Date/Time   BNP 29.9 03/21/2020 2148    Inpatient Medications  Scheduled Meds: . vitamin C  500 mg Oral Daily  . [START ON 03/23/2020] Chlorhexidine Gluconate Cloth  6 each Topical Daily  . dexamethasone (DECADRON) injection  6 mg Intravenous Q24H  . insulin aspart  0-15 Units Subcutaneous TID WC  . insulin aspart  0-5 Units Subcutaneous QHS  . Ipratropium-Albuterol  1 puff Inhalation Q6H  . levothyroxine  100 mcg Oral Q0600  . simvastatin  10 mg Oral Daily  . sodium chloride flush  3 mL Intravenous Q12H  . zinc sulfate  220 mg Oral Daily   Continuous Infusions: . sodium chloride 125 mL/hr at 03/22/20 1240  . sodium chloride Stopped (03/22/20 0424)  . azithromycin (ZITHROMAX) 500 MG IVPB (Vial-Mate [employer])    . cefTRIAXone (ROCEPHIN)  IV    . dextrose 5 % and 0.45% NaCl 1,000 mL (03/22/20 0426)  . heparin 1,300 Units/hr (03/22/20 1337)  . [START ON 03/23/2020] remdesivir 100 mg in NS 100 mL     PRN Meds:.acetaminophen, chlorpheniramine-HYDROcodone, dextrose, guaiFENesin-dextromethorphan, HYDROmorphone (DILAUDID) injection, ondansetron **OR** ondansetron (ZOFRAN) IV, oxyCODONE, senna-docusate  Micro Results Recent Results (from the past 240 hour(s))  Blood Culture (routine x 2)     Status: None (Preliminary result)   Collection Time: 03/21/20 10:00 PM   Specimen: BLOOD  Result Value Ref  Range Status   Specimen Description BLOOD LEFT ANTECUBITAL  Final   Special Requests   Final    BOTTLES DRAWN AEROBIC AND ANAEROBIC Blood Culture results may not be optimal due to an inadequate volume of blood received in culture bottles   Culture   Final    NO GROWTH < 12 HOURS Performed at [facility] [address] [place], [place] [address]    Report Status PENDING  Incomplete    Radiology Reports CT Angio Chest PE W and/or Wo Contrast  Result Date: 03/22/2020 CLINICAL DATA:  Severe back pain hypotension and shallow EXAM: CT ANGIOGRAPHY CHEST WITH CONTRAST TECHNIQUE: Multidetector CT imaging of the chest was performed using the standard protocol during bolus administration of intravenous contrast. Multiplanar CT image reconstructions and MIPs were obtained to evaluate the vascular anatomy. CONTRAST:  44m OMNIPAQUE IOHEXOL 350 MG/ML SOLN COMPARISON:  None. FINDINGS: Cardiovascular: There is a optimal opacification of the pulmonary arteries. There is no central,segmental, or subsegmental filling defects within the pulmonary arteries. The heart is normal in size. No pericardial effusion or thickening. No evidence right heart strain. There is normal three-vessel brachiocephalic anatomy without proximal stenosis. The thoracic aorta is normal in appearance. Mediastinum/Nodes: No hilar, mediastinal, or axillary adenopathy. Thyroid gland, trachea, and esophagus demonstrate no significant findings. Lungs/Pleura: Multifocal patchy ground-glass opacities are seen predominantly within the periphery of both lungs. No pleural effusion or pneumothorax is seen. Upper Abdomen: No acute abnormalities present in the visualized portions of the upper abdomen. Musculoskeletal: No chest wall abnormality. No acute or significant osseous findings. Review of the MIP images confirms the above findings. Abdomen/pelvis: Hepatobiliary: There is diffuse low density seen throughout the liver parenchyma. The main portal  vein is patent. No evidence of calcified gallstones, gallbladder wall thickening or biliary dilatation. Pancreas: Unremarkable. No pancreatic ductal dilatation or surrounding inflammatory changes. Spleen: Normal in size without focal abnormality. Adrenals/Urinary Tract: Both adrenal glands appear normal. The kidneys and collecting system appear normal without evidence of urinary tract calculus or hydronephrosis. Bladder is unremarkable. Stomach/Bowel: The stomach, small bowel, and colon are normal in appearance. No inflammatory changes, wall thickening, or obstructive findings. There appears to be a surgical anastomosis at the sigmoid rectal junction. Mild presacral soft tissue density is again identified. Vascular/Lymphatic: There are no enlarged mesenteric, retroperitoneal, or pelvic lymph nodes. An infrarenal IVC filter is seen at approximately the L3 level. There is scattered aortic and bi-iliac calcifications noted. Reproductive: The uterus and adnexa are unremarkable. Other: No evidence of abdominal wall mass or hernia. Musculoskeletal: No acute or significant osseous findings. IMPRESSION: No central, segmental, or subsegmental pulmonary embolism. Multifocal patchy airspace opacities throughout both lungs, likely consistent with multifocal pneumonia. Hepatic steatosis Aortic Atherosclerosis (ICD10-I70.0). Electronically Signed   By: BPrudencio PairM.D.   On: 03/22/2020 00:07   MR THORACIC SPINE W WO CONTRAST  Result Date: 03/22/2020 CLINICAL DATA:  Bilateral lower extremity pain and numbness with urinary retention. Diabetes. History of colon cancer. EXAM: MRI THORACIC WITHOUT AND WITH CONTRAST TECHNIQUE: Multiplanar and multiecho pulse sequences of the thoracic spine were obtained without and with intravenous contrast. CONTRAST:  9.512mGADAVIST GADOBUTROL 1 MMOL/ML IV SOLN COMPARISON:  CT chest done yesterday. FINDINGS: MRI THORACIC SPINE FINDINGS Alignment:  Normal Vertebrae: Normal.  No fracture or primary  bone lesion. No metastatic disease. Insignificant small hemangioma within the T11 vertebral body. Cord: No primary cord pathology. See below regarding stenosis at T7-8. Paraspinal and other soft tissues: Negative Disc levels: No abnormality at T5-6 or above. T6-7: Small right paracentral disc protrusion indents the thecal sac slightly but does not cause apparent neural compression. T7-8: Left posterolateral disc herniation effaces the ventral subarachnoid space and indents the left side of the cord. This could be symptomatic. T8-9 through T12-L1: Normal. 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 Ample subarachnoid space is present dorsal to the cord at this level. None the less, this could possibly be symptomatic. Small right paracentral disc herniation at T6-7 without apparent neural compression. Electronically Signed   By: Nelson Chimes M.D.   On: 03/22/2020 12:32   MR Lumbar Spine W Wo Contrast  Result Date: 03/22/2020 CLINICAL DATA:  Back pain.  Urinary retention.  Diabetes. EXAM: MRI LUMBAR SPINE WITHOUT AND WITH CONTRAST TECHNIQUE: Multiplanar and multiecho pulse sequences of the lumbar spine were obtained without and with intravenous contrast. CONTRAST:  9.40m GADAVIST GADOBUTROL 1 MMOL/ML IV SOLN COMPARISON:  None. FINDINGS: Segmentation:  5 lumbar type vertebral bodies. Alignment:  Slightly exaggerated lower lumbar lordosis. Vertebrae:  No fracture or primary bone lesion. Conus medullaris and cauda equina: Conus extends to the L1 level. Conus and cauda equina appear normal. Paraspinal and other soft tissues: There appears be thrombosis of the IVC and both iliac veins up to the level of the IVC filter. Flow is restored in the IVC by inflow from the renal veins. Disc levels: No abnormality from L1-2 through L2-3. L3-4: No disc abnormality.  Mild facet hypertrophy.  No stenosis. L4-5: No disc  abnormality. Mild to moderate facet hypertrophy. No stenosis. L5-S1: Minimal disc bulge.  Mild facet hypertrophy.  No stenosis. IMPRESSION: No significant disc pathology. No stenosis or neural compression in the lumbar region. Patient has exaggerated lumbar lordosis. 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 Flow is restored in the IVC above that by inflow from the renal veins. I am in the process of calling this report. Electronically Signed   By: MNelson ChimesM.D.   On: 03/22/2020 12:46   CT ABDOMEN PELVIS W CONTRAST  Result Date: 03/22/2020 CLINICAL DATA:  Severe back pain hypotension and shallow EXAM: CT ANGIOGRAPHY CHEST WITH CONTRAST TECHNIQUE: Multidetector CT imaging of the chest was performed using the standard protocol during bolus administration of intravenous contrast. Multiplanar CT image reconstructions and MIPs were obtained to evaluate the vascular anatomy. CONTRAST:  766mOMNIPAQUE IOHEXOL 350 MG/ML SOLN COMPARISON:  None. FINDINGS: Cardiovascular: There is a optimal opacification of the pulmonary arteries. There is no central,segmental, or subsegmental filling defects within the pulmonary arteries. The heart is normal in size. No pericardial effusion or thickening. No evidence right heart strain. There is normal three-vessel brachiocephalic anatomy without proximal stenosis. The thoracic aorta is normal in appearance. Mediastinum/Nodes: No hilar, mediastinal, or axillary adenopathy. Thyroid gland, trachea, and esophagus demonstrate no significant findings. Lungs/Pleura: Multifocal patchy ground-glass opacities are seen predominantly within the periphery of both lungs. No pleural effusion or pneumothorax is seen. Upper Abdomen: No acute abnormalities present in  the visualized portions of the upper abdomen.  Musculoskeletal: No chest wall abnormality. No acute or significant osseous findings. Review of the MIP images confirms the above findings. Abdomen/pelvis: Hepatobiliary: There is diffuse low density seen throughout the liver parenchyma. The main portal vein is patent. No evidence of calcified gallstones, gallbladder wall thickening or biliary dilatation. Pancreas: Unremarkable. No pancreatic ductal dilatation or surrounding inflammatory changes. Spleen: Normal in size without focal abnormality. Adrenals/Urinary Tract: Both adrenal glands appear normal. The kidneys and collecting system appear normal without evidence of urinary tract calculus or hydronephrosis. Bladder is unremarkable. Stomach/Bowel: The stomach, small bowel, and colon are normal in appearance. No inflammatory changes, wall thickening, or obstructive findings. There appears to be a surgical anastomosis at the sigmoid rectal junction. Mild presacral soft tissue density is again identified. Vascular/Lymphatic: There are no enlarged mesenteric, retroperitoneal, or pelvic lymph nodes. An infrarenal IVC filter is seen at approximately the L3 level. There is scattered aortic and bi-iliac calcifications noted. Reproductive: The uterus and adnexa are unremarkable. Other: No evidence of abdominal wall mass or hernia. Musculoskeletal: No acute or significant osseous findings. IMPRESSION: No central, segmental, or subsegmental pulmonary embolism. Multifocal patchy airspace opacities throughout both lungs, likely consistent with multifocal pneumonia. Hepatic steatosis Aortic Atherosclerosis (ICD10-I70.0). Electronically Signed   By: Prudencio Pair M.D.   On: 03/22/2020 00:07   DG Chest Port 1 View  Result Date: 03/21/2020 CLINICAL DATA:  54 year old female with weakness and hypotension. EXAM: PORTABLE CHEST 1 VIEW COMPARISON:  Chest radiograph dated 03/25/2016 FINDINGS: Faint bilateral peripheral and subpleural densities, right greater left concerning for  pneumonia, possibly atypical or viral in etiology including COVID-19. Clinical correlation is recommended. No focal consolidation, pleural effusion, pneumothorax. The cardiac silhouette is within limits. No acute osseous pathology. IMPRESSION: Findings concerning for pneumonia. Electronically Signed   By: Anner Crete M.D.   On: 03/21/2020 22:17     Phillips Climes M.D on 03/22/2020 at 1:53 PM    [facility] -  Office  [phone]

## 2020-03-22 NOTE — ED Notes (Signed)
CBG 296

## 2020-03-22 NOTE — ED Notes (Signed)
CBG 251 @ 0225. Due to downtime, endo tool was not accessible. MD aware. Endo tool to be updated now

## 2020-03-22 NOTE — Progress Notes (Signed)
Attempted to call and receive report. Primary RN in contact room. Mel Almond, RN stated that she suspected pt was currently being sent to MRI. Requested that primary RN be informed of attempted report call.

## 2020-03-22 NOTE — Consult Note (Signed)
[facility] [place]  Patient name: [name] MRN: [id number] DOB: Feb 27, 1966 Sex: female    HPI: [name] is a 54 y.o. female seen for evaluation of extensive DVT.  She has a very complicated history.  She had prior history of pulmonary embolus.  She had colon cancer diagnosed in 2012 underwent a resection while she was on anticoagulation for her pulmonary embolus.  She had a vena cava filter placed in 2012 prior to her colon resection.  1 month later after her colon resection, she had repeat venogram for potential filter removal.  She had thrombus in the filter at that time and the decision was made in 2012 to leave her filter permanently.  She reports that she has had no difficulty since that time.  She has had no swelling.  Her son was diagnosed with Covid on 03/03/2020 and she was diagnosed 1 week later.  Over the last 2 to 3 days she has had increased swelling and pain in both lower extremities and also back pain.  She presented to the emergency room for further evaluation of this.  She underwent imaging studies to include CT of her abdomen and also MRI.  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  She underwent duplex today and this revealed acute thrombus in her iliac veins, femoral veins, popliteal veins and calf veins bilaterally.  She reports a significant pain in both lower extremities with walking and also numbness in her calves.  Past Medical History:  Diagnosis Date  . 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  ECC with HPV effect  . 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 Diabetes mellitus without complication ([place])   . Headache(784.0)    experiences migraine every couple of weeks. takes only excedrin  . 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  colposcopy adequate normal 10/2015   . Hyperlipidemia   . Pulmonary embolism ([place])   . STD (sexually transmitted disease) 1991   POS GC-TREATED  . Thyroid disease     Family History  Problem Relation Age of Onset  . Heart disease Father   . Lung cancer Mother   . Brain cancer Mother   . Diabetes Mother   . Hypertension Mother   . Diabetes Brother   . Kidney disease Brother   . Cervical cancer Paternal Grandmother   . Breast cancer Neg Hx     SOCIAL HISTORY: Social History   Tobacco Use  . Smoking status: Never Smoker  . Smokeless tobacco: Never Used  Substance Use Topics  . Alcohol use: Yes    Comment: rare- twice a year    Allergies  Allergen Reactions  . Metformin And Related Other (See Comments)    Chest pain  . Penicillins Hives    Current Facility-Administered Medications  Medication Dose Route Frequency Provider Last Rate Last Admin  . acetaminophen (TYLENOL) tablet 1,000 mg  1,000 mg Oral Q6H PRN Zada Finders R, MD      . ascorbic acid (VITAMIN C) tablet 500 mg  500 mg Oral Daily Zada Finders R, MD   500 mg at 03/22/20 0913  . azithromycin (ZITHROMAX) 500 mg in sodium chloride 0.9 % 250 mL IVPB  500 mg Intravenous Q24H Patel, Vishal R, MD      . cefTRIAXone (ROCEPHIN) 1 g in sodium chloride 0.9 % 100 mL IVPB  1 g Intravenous Q24H Lenore Cordia, MD      . Derrill Memo ON 03/23/2020] Chlorhexidine Gluconate Cloth 2 % PADS 6 each  6 each Topical Daily Elgergawy, Silver Huguenin, MD      . chlorpheniramine-HYDROcodone (TUSSIONEX) 10-8 MG/5ML suspension 5 mL  5 mL Oral Q12H PRN Zada Finders R, MD      . dexamethasone (DECADRON) injection 6 mg  6 mg Intravenous Q24H Zada Finders R, MD   6 mg at 03/22/20 0310  . dextrose 5 %-0.45 % sodium chloride infusion   Intravenous Continuous Ward, Kristen N, DO 75 mL/hr at 03/22/20 0426 1,000 mL at 03/22/20 0426  . dextrose 50 % solution 0-50 mL  0-50 mL  Intravenous PRN Ward, Kristen N, DO      . famotidine (PEPCID) tablet 20 mg  20 mg Oral BID Elgergawy, Silver Huguenin, MD   20 mg at 03/22/20 1452  . guaiFENesin-dextromethorphan (ROBITUSSIN DM) 100-10 MG/5ML syrup 10 mL  10 mL Oral Q4H PRN Zada Finders R, MD      . heparin ADULT infusion 100 units/mL (25000 units/287mL sodium chloride 0.45%)  1,300 Units/hr Intravenous Continuous Elgergawy, Silver Huguenin, MD 13 mL/hr at 03/22/20 1337 1,300 Units/hr at 03/22/20 1337  . HYDROmorphone (DILAUDID) injection 0.5 mg  0.5 mg Intravenous Q2H PRN Elgergawy, Silver Huguenin, MD      . insulin aspart (novoLOG) injection 0-15 Units  0-15 Units Subcutaneous TID WC Elgergawy, Silver Huguenin, MD   5 Units at 03/22/20 1300  . insulin aspart (novoLOG) injection 0-5 Units  0-5 Units Subcutaneous QHS Elgergawy, Dawood S, MD      . insulin glargine (LANTUS) injection 8 Units  8 Units Subcutaneous Daily Elgergawy, Silver Huguenin, MD   8 Units at 03/22/20 1448  . Ipratropium-Albuterol (COMBIVENT) respimat 1 puff  1 puff Inhalation Q6H Zada Finders R, MD   1 puff at 03/22/20 1310  . levothyroxine (SYNTHROID) tablet 100 mcg  100 mcg Oral Q0600 Lenore Cordia, MD      . ondansetron [facility]) tablet 4 mg  4 mg Oral Q6H PRN Lenore Cordia, MD       Or  . ondansetron (ZOFRAN) injection 4 mg  4 mg Intravenous Q6H PRN Zada Finders R, MD      . oxyCODONE (Oxy IR/ROXICODONE) immediate release tablet 10 mg  10 mg Oral Q4H PRN Lenore Cordia, MD      . Derrill Memo ON 03/23/2020] remdesivir 100 mg in sodium chloride 0.9 % 100 mL IVPB  100 mg Intravenous Daily Zada Finders R, MD      . senna-docusate (Senokot-S) tablet 1 tablet  1 tablet Oral QHS PRN Zada Finders R, MD      . simvastatin (ZOCOR) tablet 10 mg  10 mg Oral Daily Lenore Cordia, MD   10 mg at 03/22/20 0913  . sodium bicarbonate 150 mEq in sterile water 1,000 mL infusion   Intravenous Continuous Elgergawy, Silver Huguenin, MD      . sodium chloride flush (NS) 0.9 % injection 3 mL  3 mL Intravenous Q12H  Zada Finders R, MD   3 mL at 03/22/20 0913  . zinc sulfate  capsule 220 mg  220 mg Oral Daily Lenore Cordia, MD   220 mg at 03/22/20 [id number]    REVIEW OF SYSTEMS:  [X]  denotes positive finding, [ ]  denotes negative finding Cardiac  Comments:  Chest pain or chest pressure:    Shortness of breath upon exertion:    Short of breath when lying flat:    Irregular heart rhythm:        Vascular    Pain in calf, thigh, or hip brought on by ambulation:    Pain in feet at night that wakes you up from your sleep:     Blood clot in your veins: x   Leg swelling:           PHYSICAL EXAM: Vitals:   03/22/20 0711 03/22/20 0724 03/22/20 0800 03/22/20 1200  BP: 113/84 109/81 108/77 110/78  Pulse: 99 88 95 91  Resp: 16 19 11 12   Temp: 98.1 F (36.7 C)  98 F (36.7 C) 98.2 F (36.8 C)  TempSrc: Oral  Oral Oral  SpO2: 100% 96% 100% 97%  Weight: 94.2 kg     Height: 5\' 11"  (1.803 m)       GENERAL: The patient is a well-nourished female, in no acute distress. The vital signs are documented above. CARDIOVASCULAR: 2+ left dorsalis pedis pulse.  I do not palpate a right pedal pulse.  She has biphasic and equal signals at her dorsalis pedis and posterior tibial bilaterally with hand-held Doppler.  She does have marked swelling in both lower extremities. PULMONARY: There is good air exchange  MUSCULOSKELETAL: There are no major deformities or cyanosis. NEUROLOGIC: No focal weakness or paresthesias are detected.  She is able to move her feet and feel her toes.  She does have some restricted motion from the swelling. SKIN: There are no ulcers or rashes noted. PSYCHIATRIC: The patient has a normal affect.  DATA:  MRI showing occlusion of her vena cava filter and iliac veins bilaterally.  Venous Doppler shows extensive acute clot from her femoral veins distally into her calf veins  MEDICAL ISSUES: Had long discussion with the patient.  She has no evidence of phlegmasia currently.  She is on IV heparin  and this should continue.  She needs to be an strict bed rest in Trendelenburg position with her legs higher than her heart at all times.  I discussed the potential need for mechanical thrombectomy for improvement in her swelling and reduction of long-term sequela of this extensive DVT.  Will continue to follow and recommend timing of this pending on her resolution from her Covid infection  Most likely this is related to both her immobility as she is recovering from Covid and also probable hypercoagulability related to Covid infection    Rosetta Posner, MD [facility] Tel [phone] Pager [phone]

## 2020-03-22 NOTE — ED Notes (Signed)
Spoke with Dr Myna Hidalgo.  Dr Myna Hidalgo to evaluate patient and speak with CCM.  AC notified of evaluation of the patient.

## 2020-03-22 NOTE — ED Provider Notes (Addendum)
11:30 PM  Assumed care from Dr. Tyrone Nine.  Patient is a 54 year old female with previous history of colon cancer, PE no longer on anticoagulation status post IVC filter, colon cancer, diabetes who presents to the emergency department for concerns for fatigue, decreased appetite and now back pain with bilateral lower extremity tingling.  Started having symptoms of COVID-19 about 3 to 4 weeks ago.  Was diagnosed on May 21 at urgent care.  States today started having increasing back pain without bladder incontinence.  States she always has some bowel incontinence secondary to her history of colon cancer.  No known fevers.  No history of IV drug abuse.  No trauma.  States she feels tingling in both of her legs and they are painful.  She has been able to walk.  She has been hypotensive here and received 3 L of IV fluids.  Labs show a leukocytosis, AKI, metabolic acidosis, lactic acidosis, hyperglycemia.  Chest x-ray concerning for multifocal pneumonia.  No oxygen requirement here.  Getting Rocephin and azithromycin for possible bacterial pneumonia.  Urine, cultures pending.  CT of the chest, abdomen pelvis pending.  Patient will need admission and possible MRIs of the spine.  VBG showed pH of 7.303, PCO2 33.3, PO2 126, bicarb 16.5, sodium 137, potassium 4.5, hemoglobin 13.6.   On my reevaluation, patient is able to stand but states she has so much pain in her legs and back that makes it hard to do so.  She denies any saddle anesthesia but feels tingling throughout her legs diffusely and pain.  She is complaining of lower back pain and is tender over the low thoracic and lumbar spine without redness, warmth, ecchymosis, swelling or any deformity noted.  She is unable to urinate here and has 290 mL in her bladder.  Will place Foley catheter.  CT of her chest, abdomen and pelvis shows multifocal pneumonia but no other acute abnormality.  No abnormality noted to the spine.  We will proceed with MRI of the thoracic and  lumbar spine with and without contrast to evaluate for infectious etiology, cauda equina, neoplasm/metastasis.  I feel patient will need admission.  Her blood pressure has significantly improved with IV fluids.  I am also concerned for possible DKA given her hyperglycemia, elevated anion gap metabolic acidosis although this could also be from uremia and lactic acidosis.  Will start IV insulin.  Repeat blood glucose per nurses 332.  12:53 AM Discussed patient's case with hospitalist, Dr. Posey Pronto.  I have recommended admission and patient (and family if present) agree with this plan. Admitting physician will place admission orders.   I reviewed all nursing notes, vitals, pertinent previous records and reviewed/interpreted all EKGs, lab and urine results, imaging (as available).  MRI is currently pending.  Will be slight delay in obtaining MRI secondary to patient being Covid positive.  Updated patient's husband by phone.  Reggie, husband, [phone]    EKG Interpretation  Date/Time:  Tuesday March 21 2020 21:47:16 EDT Ventricular Rate:  80 PR Interval:    QRS Duration: 85 QT Interval:  431 QTC Calculation: 498 R Axis:   72 Text Interpretation: Sinus rhythm Borderline short PR interval Abnormal R-wave progression, early transition Borderline prolonged QT interval No significant change since last tracing Confirmed by Deno Etienne [phone]) on 03/21/2020 10:03:48 PM        CRITICAL CARE Performed by: Cyril Mourning Janiel Derhammer   Total critical care time: 45 minutes  Critical care time was exclusive of separately billable procedures and treating other  patients.  Critical care was necessary to treat or prevent imminent or life-threatening deterioration.  Critical care was time spent personally by me on the following activities: development of treatment plan with patient and/or surrogate as well as nursing, discussions with consultants, evaluation of patient's response to treatment, examination of patient,  obtaining history from patient or surrogate, ordering and performing treatments and interventions, ordering and review of laboratory studies, ordering and review of radiographic studies, pulse oximetry and re-evaluation of patient's condition.    Velta Rockholt, Delice Bison, DO 03/22/20 0301    Kathan Kirker, Delice Bison, DO 03/22/20 [phone]

## 2020-03-23 DIAGNOSIS — E1110 Type 2 diabetes mellitus with ketoacidosis without coma: Secondary | ICD-10-CM

## 2020-03-23 DIAGNOSIS — I82402 Acute embolism and thrombosis of unspecified deep veins of left lower extremity: Secondary | ICD-10-CM

## 2020-03-23 LAB — CBC WITH DIFFERENTIAL/PLATELET
Abs Immature Granulocytes: 0.06 10*3/uL (ref 0.00–0.07)
Basophils Absolute: 0 10*3/uL (ref 0.0–0.1)
Basophils Relative: 0 %
Eosinophils Absolute: 0 10*3/uL (ref 0.0–0.5)
Eosinophils Relative: 0 %
HCT: 38.4 % (ref 36.0–46.0)
Hemoglobin: 12.1 g/dL (ref 12.0–15.0)
Immature Granulocytes: 0 %
Lymphocytes Relative: 6 %
Lymphs Abs: 0.8 10*3/uL (ref 0.7–4.0)
MCH: 29.2 pg (ref 26.0–34.0)
MCHC: 31.5 g/dL (ref 30.0–36.0)
MCV: 92.8 fL (ref 80.0–100.0)
Monocytes Absolute: 0.2 10*3/uL (ref 0.1–1.0)
Monocytes Relative: 2 %
Neutro Abs: 13.5 10*3/uL — ABNORMAL HIGH (ref 1.7–7.7)
Neutrophils Relative %: 92 %
Platelets: 141 10*3/uL — ABNORMAL LOW (ref 150–400)
RBC: 4.14 MIL/uL (ref 3.87–5.11)
RDW: 13.8 % (ref 11.5–15.5)
WBC: 14.6 10*3/uL — ABNORMAL HIGH (ref 4.0–10.5)
nRBC: 0 % (ref 0.0–0.2)

## 2020-03-23 LAB — COMPREHENSIVE METABOLIC PANEL
ALT: 19 U/L (ref 0–44)
AST: 23 U/L (ref 15–41)
Albumin: 2.9 g/dL — ABNORMAL LOW (ref 3.5–5.0)
Alkaline Phosphatase: 74 U/L (ref 38–126)
Anion gap: 14 (ref 5–15)
BUN: 22 mg/dL — ABNORMAL HIGH (ref 6–20)
CO2: 20 mmol/L — ABNORMAL LOW (ref 22–32)
Calcium: 8.3 mg/dL — ABNORMAL LOW (ref 8.9–10.3)
Chloride: 103 mmol/L (ref 98–111)
Creatinine, Ser: 0.85 mg/dL (ref 0.44–1.00)
GFR calc Af Amer: >60 mL/min (ref >60)
GFR calc non Af Amer: >60 mL/min (ref >60)
Glucose, Bld: 224 mg/dL — ABNORMAL HIGH (ref 70–99)
Potassium: 4.1 mmol/L (ref 3.5–5.1)
Sodium: 137 mmol/L (ref 135–145)
Total Bilirubin: 0.9 mg/dL (ref 0.3–1.2)
Total Protein: 6.4 g/dL — ABNORMAL LOW (ref 6.5–8.1)

## 2020-03-23 LAB — GLUCOSE, CAPILLARY
Glucose-Capillary: 203 mg/dL — ABNORMAL HIGH (ref 70–99)
Glucose-Capillary: 216 mg/dL — ABNORMAL HIGH (ref 70–99)
Glucose-Capillary: 200 mg/dL — ABNORMAL HIGH (ref 70–99)
Glucose-Capillary: 223 mg/dL — ABNORMAL HIGH (ref 70–99)

## 2020-03-23 LAB — MAGNESIUM: Magnesium: 2 mg/dL (ref 1.7–2.4)

## 2020-03-23 LAB — HEPARIN LEVEL (UNFRACTIONATED): Heparin Unfractionated: 0.69 IU/mL (ref 0.30–0.70)

## 2020-03-23 LAB — HEMOGLOBIN A1C
Hgb A1c MFr Bld: 8.6 % — ABNORMAL HIGH (ref 4.8–5.6)
Mean Plasma Glucose: 200.12 mg/dL

## 2020-03-23 LAB — D-DIMER, QUANTITATIVE: D-Dimer, Quant: >20 ug/mL-FEU — ABNORMAL HIGH (ref 0.00–0.50)

## 2020-03-23 LAB — PHOSPHORUS: Phosphorus: 2.8 mg/dL (ref 2.5–4.6)

## 2020-03-23 LAB — FERRITIN: Ferritin: 252 ng/mL (ref 11–307)

## 2020-03-23 MED ORDER — GLUCERNA SHAKE PO LIQD
237.0000 mL | Freq: Three times a day (TID) | ORAL | Status: DC
  Administered 2020-03-23 – 2020-03-29 (×12): 237 mL via ORAL

## 2020-03-23 MED ORDER — AZITHROMYCIN 500 MG PO TABS
500.0000 mg | ORAL_TABLET | Freq: Every day | ORAL | Status: DC
  Administered 2020-03-23 – 2020-03-24 (×2): 500 mg via ORAL
  Filled 2020-03-23 (×2): qty 1

## 2020-03-23 MED ORDER — INSULIN GLARGINE 100 UNIT/ML ~~LOC~~ SOLN
10.0000 [IU] | Freq: Every day | SUBCUTANEOUS | Status: DC
  Administered 2020-03-24: 10 [IU] via SUBCUTANEOUS
  Filled 2020-03-23: qty 0.1

## 2020-03-23 MED ORDER — LINAGLIPTIN 5 MG PO TABS
5.0000 mg | ORAL_TABLET | Freq: Every day | ORAL | Status: DC
  Administered 2020-03-23 – 2020-04-06 (×14): 5 mg via ORAL
  Filled 2020-03-23 (×15): qty 1

## 2020-03-23 NOTE — Progress Notes (Signed)
Initial Nutrition Assessment  DOCUMENTATION CODES:   Not applicable  INTERVENTION:   Provide Glucerna Shake po TID, each supplement provides 220 kcal and 10 grams of protein  Encourage adequate PO intake.   NUTRITION DIAGNOSIS:   Increased nutrient needs related to acute illness(COVID) as evidenced by estimated needs.  GOAL:   Patient will meet greater than or equal to 90% of their needs  MONITOR:   PO intake, Supplement acceptance, Skin, Weight trends, Labs, I & O's  REASON FOR ASSESSMENT:   Malnutrition Screening Tool    ASSESSMENT:   54 y.o. female with medical history significant for type 2 diabetes, hypothyroidism, hyperlipidemia, history of PE, and history of colon cancer who presents for evaluation of new onset low back pain with lower extremity weakness and change in sensation, she was recently diagnosed with COVID-19 on 03/10/2020. Pt with Sepsis due to COVID-19 pneumonitis. MRI lumbar spine/thoracic spine is done, significant for IVC filter thrombosis.  RD working remotely.  Pt unavailable during attempted time of contact. Unable to obtain pt nutrition history at this time. Meal completion has been varied from 35-100%. Pt with no significant weight loss per weight records. RD to order nutritional supplements to aid in caloric and protein needs. Unable to complete Nutrition-Focused physical exam at this time.   Labs and medications reviewed.   Diet Order:   Diet Order            Diet Carb Modified Fluid consistency: Thin; Room service appropriate? Yes  Diet effective now              EDUCATION NEEDS:   Not appropriate for education at this time  Skin:  Skin Assessment: Reviewed RN Assessment  Last BM:  6/2  Height:   Ht Readings from Last 1 Encounters:  03/22/20 5\' 11"  (1.803 m)    Weight:   Wt Readings from Last 1 Encounters:  03/22/20 94.2 kg    BMI:  Body mass index is 28.96 kg/m.  Estimated Nutritional Needs:   Kcal:   1900-2100  Protein:  95-110 grams  Fluid:  >/= 2 L/day  Corrin Parker, MS, RD, LDN RD pager number/after hours weekend pager number on Amion.

## 2020-03-23 NOTE — Progress Notes (Signed)
Inpatient Diabetes Program Recommendations  AACE/ADA: New Consensus Statement on Inpatient Glycemic Control (2015)  Target Ranges:  Prepandial:   less than 140 mg/dL      Peak postprandial:   less than 180 mg/dL (1-2 hours)      Critically ill patients:  140 - 180 mg/dL   Lab Results  Component Value Date   GLUCAP 203 (H) 03/23/2020   HGBA1C 8.6 (H) 03/23/2020    Review of Glycemic Control Results for [name], [name] (MRN [id number]) as of 03/23/2020 08:57  Ref. Range 03/22/2020 12:40 03/22/2020 16:09 03/22/2020 21:22 03/23/2020 07:57  Glucose-Capillary Latest Ref Range: 70 - 99 mg/dL 232 (H) 173 (H) 175 (H) 203 (H)   Diabetes history: Type 2 DM Outpatient Diabetes medications: Jardiance 25 mg QD, Humalog BID Current orders for Inpatient glycemic control: Tradjenta 5 mg QD, Novolog 0-15 units TID, Novolog 0-5 units QHS, Lantus 8 units QD  Decadron 6 mg QD  Inpatient Diabetes Program Recommendations:    Consider increasing Lantus to 10 units QD.   Thanks, Bronson Curb, MSN, RNC-OB Diabetes Coordinator [phone] (8a-5p)

## 2020-03-23 NOTE — Progress Notes (Signed)
PROGRESS NOTE                                                                                                                                                                                                             Patient Demographics:    [name], is a 54 y.o. female, DOB - Mar 08, 1966, [id number]  Admit date - 03/21/2020   Admitting Physician Lenore Cordia, MD  Outpatient Primary MD for the patient is Glendale Chard, MD  LOS - 1   Chief Complaint  Patient presents with  . Hypotension  . Back Pain       Brief Narrative    [name] is a 54 y.o. female with medical history significant for type 2 diabetes, hypothyroidism, hyperlipidemia, history of PE, and history of colon cancer who presents to the ED for evaluation of new onset low back pain with lower extremity weakness and change in sensation, she was recently diagnosed with COVID-19 on 03/10/2020.  She did not require hospitalization or specific treatment at that time however has been having progressive fatigue and malaise since then.  She was given antibiotics for possible UTI earlier today.    Is presented to ED 6/1 secondary to complaints of lower back pain and weakness in both of her legs.  CTA chest PE study and CT abdomen/pelvis with contrast is negative for PE.  Focal patchy airspace opacities are seen throughout both lungs.  Hepatic steatosis is noted. -MRI thoracic/lumbar spine were significant for T7 disc herniation, does not with any focal deficits, but it did show evidence of IVC filter clotting, had venous Doppler showing extensive DVT in the bilateral lower extremities for which vascular surgeon has been consulted.     Subjective:    [name] today has any dyspnea, reports mild cough, reports lower extremity swelling feels slightly better, .   Assessment  & Plan :    Principal Problem:   Sepsis due to COVID-19 [facility]) Active Problems:   Hyperlipidemia    Hypothyroidism   Type 2 diabetes mellitus without complication, without long-term current use of insulin ([place])     Sepsis due to COVID-19 pneumonitis - SARS-CoV-2 PCR positive on 03/11/2019.  Has been having progressive fatigue, weakness.  CT imaging shows multifocal infiltrates. -Patient with no hypoxia, but there is imaging significant for multifocal opacity. -Continue with  IV steroids. -Continue with IV remdesivir. -Encouraged to use incentive spirometer, she is currently now at bedrest.  Low back pain with weakness and numbness: -MRI lumbar spine/thoracic spine is done, significant for IVC filter thrombosis.  Significant for T7 disc herniation, will discuss with neurosurgery regarding recommendations.  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 -Vascular surgery consult greatly appreciated, for now continue with full anticoagulation, and he will need mechanical thrombectomy for improvement in her swelling and reduction of long-term sequelae of this extensive DVT.  Acute kidney injury: Likely secondary to sepsis.  Continue IV fluids and follow labs.  Hyperglycemia and type 2 diabetes with mild DKA : -She is initially on insulin drip, this has been stopped, he has resolved, she is currently transitioned to Lantus 10 units daily, insulin sliding scale with good control. -Started on Jardiance. -Beta hydroxybutyric acid has normalized. -Hemoglobin A1c is 8.6 -We will DC bicarb drip given bicarb level improved up to 20.  Hyperkalemia: K5.5 in setting of AKI.  Give Lokelma 10 mg once.    Resolved with hydration  Hypothyroidism: Continue Synthroid.    Hyperlipidemia: Continue simvastatin.    COVID-19 Labs  Recent Labs    03/21/20 2143 03/22/20 0241 03/22/20 0814 03/23/20 0548  DDIMER >20.00* >20.00*  --  >20.00*  FERRITIN  --  254  --  252  CRP  --   --  6.6*  --      Lab Results  Component Value Date   SARSCOV2NAA POSITIVE (A) 03/10/2020     Code Status : Full   Family Communication  :  D/W husband via phone.  Disposition Plan  :  Status is: Inpatient  Remains inpatient appropriate because:IV treatments appropriate due to intensity of illness or inability to take PO   Dispo: The patient is from: Home              Anticipated d/c is to: Home              Anticipated d/c date is: 3 days              Patient currently is not medically stable to d/c.      Consults  :  Vascular Surgery  Procedures  : None  DVT Prophylaxis  :  Heparin GTT  Lab Results  Component Value Date   PLT 141 (L) 03/23/2020    Antibiotics  :    Anti-infectives (From admission, onward)   Start     Dose/Rate Route Frequency Ordered Stop   03/23/20 2200  azithromycin (ZITHROMAX) tablet 500 mg     500 mg Oral Daily at bedtime 03/23/20 0806     03/23/20 1000  remdesivir 100 mg in sodium chloride 0.9 % 100 mL IVPB     100 mg 200 mL/hr over 30 Minutes Intravenous Daily 03/22/20 0110 03/27/20 0959   03/22/20 2200  azithromycin (ZITHROMAX) 500 mg in sodium chloride 0.9 % 250 mL IVPB  Status:  Discontinued     500 mg 250 mL/hr over 60 Minutes Intravenous Every 24 hours 03/22/20 0111 03/23/20 0806   03/22/20 2200  cefTRIAXone (ROCEPHIN) 1 g in sodium chloride 0.9 % 100 mL IVPB     1 g 200 mL/hr over 30 Minutes Intravenous Every 24 hours 03/22/20 0111     03/22/20 0200  remdesivir 200 mg in sodium chloride 0.9% 250 mL IVPB     200 mg 580 mL/hr over 30  Minutes Intravenous Once 03/22/20 0110 03/22/20 1900   03/22/20 0015  cefTRIAXone (ROCEPHIN) 1 g in sodium chloride 0.9 % 100 mL IVPB     1 g 200 mL/hr over 30 Minutes Intravenous  Once 03/22/20 0010 03/22/20 0103   03/21/20 2300  cefTRIAXone (ROCEPHIN) injection 1 g  Status:  Discontinued     1 g Intramuscular  Once 03/21/20 2253 03/22/20 0010   03/21/20 2300  azithromycin (ZITHROMAX) 500 mg in sodium chloride  0.9 % 250 mL IVPB     500 mg 250 mL/hr over 60 Minutes Intravenous  Once 03/21/20 2253 03/22/20 0129        Objective:   Vitals:   03/23/20 0006 03/23/20 0400 03/23/20 0850 03/23/20 1108  BP: 114/80 104/72 104/65 106/75  Pulse: 83 77 92 90  Resp: 16 15 14 14   Temp: 98.4 F (36.9 C) 98.3 F (36.8 C) (!) 97.5 F (36.4 C) 97.9 F (36.6 C)  TempSrc: Axillary Oral Oral Oral  SpO2: 96% 92% 98% 95%  Weight:      Height:        Wt Readings from Last 3 Encounters:  03/22/20 94.2 kg  02/15/20 91.2 kg  08/02/19 95.3 kg     Intake/Output Summary (Last 24 hours) at 03/23/2020 1232 Last data filed at 03/23/2020 1003 Gross per 24 hour  Intake 3183.69 ml  Output 1075 ml  Net 2108.69 ml     Physical Exam  Awake Alert, Oriented X 3, No new F.N deficits, Normal affect Symmetrical Chest wall movement, Good air movement bilaterally, CTAB  RRR,No Gallops,Rubs or new Murmurs, No Parasternal Heave +ve B.Sounds, Abd Soft, No tenderness, No rebound - guarding or rigidity. No Cyanosis, Clubbing , has lower extremity edema.     Data Review:    CBC Recent Labs  Lab 03/21/20 2143 03/21/20 2235 03/22/20 0241 03/23/20 0548  WBC 15.2*  --  15.1* 14.6*  HGB 13.3 13.6 12.6 12.1  HCT 41.8 40.0 40.6 38.4  PLT 270  --  208 141*  MCV 93.3  --  95.1 92.8  MCH 29.7  --  29.5 29.2  MCHC 31.8  --  31.0 31.5  RDW 13.2  --  13.5 13.8  LYMPHSABS 1.3  --  0.8 0.8  MONOABS 0.6  --  0.3 0.2  EOSABS 0.0  --  0.0 0.0  BASOSABS 0.0  --  0.0 0.0    Chemistries  Recent Labs  Lab 03/21/20 2143 03/21/20 2235 03/21/20 2336 03/22/20 0241 03/22/20 2219  NA 136 137  --  139 137  K 5.9* 4.5 5.5* 4.0 4.7  CL 105  --   --  112* 106  CO2 15*  --   --  11* 20*  GLUCOSE 368*  --   --  288* 206*  BUN 31*  --   --  26* 26*  CREATININE 1.43* 1.20*  --  1.24* 1.06*  CALCIUM 8.7*  --   --  8.0* 8.2*  MG  --   --   --  1.5*  --   AST 31  --   --  17  --   ALT 23  --   --  19  --   ALKPHOS 74  --    --  77  --   BILITOT 1.8*  --   --  0.8  --    ------------------------------------------------------------------------------------------------------------------ No results for input(s): CHOL, HDL, LDLCALC, TRIG, CHOLHDL, LDLDIRECT in the last 72 hours.  Lab Results  Component Value Date   HGBA1C 8.6 (H) 03/23/2020   ------------------------------------------------------------------------------------------------------------------ Recent Labs    03/22/20 0241  TSH 10.445*   ------------------------------------------------------------------------------------------------------------------ Recent Labs    03/22/20 0241 03/23/20 0548  FERRITIN 254 252    Coagulation profile Recent Labs  Lab 03/21/20 2143  INR 1.5*    Recent Labs    03/22/20 0241 03/23/20 0548  DDIMER >20.00* >20.00*    Cardiac Enzymes No results for input(s): CKMB, TROPONINI, MYOGLOBIN in the last 168 hours.  Invalid input(s): CK ------------------------------------------------------------------------------------------------------------------    Component Value Date/Time   BNP 29.9 03/21/2020 2148    Inpatient Medications  Scheduled Meds: . vitamin C  500 mg Oral Daily  . azithromycin  500 mg Oral QHS  . Chlorhexidine Gluconate Cloth  6 each Topical Daily  . dexamethasone (DECADRON) injection  6 mg Intravenous Q24H  . famotidine  20 mg Oral BID  . insulin aspart  0-15 Units Subcutaneous TID WC  . insulin aspart  0-5 Units Subcutaneous QHS  . insulin glargine  8 Units Subcutaneous Daily  . Ipratropium-Albuterol  1 puff Inhalation Q6H  . levothyroxine  100 mcg Oral Q0600  . linagliptin  5 mg Oral Daily  . simvastatin  10 mg Oral Daily  . sodium chloride flush  3 mL Intravenous Q12H  . zinc sulfate  220 mg Oral Daily   Continuous Infusions: . cefTRIAXone (ROCEPHIN)  IV Stopped (03/23/20 0924)  . heparin 950 Units/hr (03/23/20 1108)  . remdesivir 100 mg in NS 100 mL 100 mg (03/23/20 1003)   .  sodium bicarbonate (isotonic) infusion in sterile water 75 mL/hr at 03/22/20 1605   PRN Meds:.acetaminophen, chlorpheniramine-HYDROcodone, dextrose, guaiFENesin-dextromethorphan, HYDROmorphone (DILAUDID) injection, ondansetron **OR** ondansetron (ZOFRAN) IV, oxyCODONE, senna-docusate, zolpidem  Micro Results Recent Results (from the past 240 hour(s))  Blood Culture (routine x 2)     Status: None (Preliminary result)   Collection Time: 03/21/20 10:00 PM   Specimen: BLOOD  Result Value Ref Range Status   Specimen Description BLOOD LEFT ANTECUBITAL  Final   Special Requests   Final    BOTTLES DRAWN AEROBIC AND ANAEROBIC Blood Culture results may not be optimal due to an inadequate volume of blood received in culture bottles   Culture   Final    NO GROWTH 2 DAYS Performed at [facility], [place] [address] [place], [place] [address]    Report Status PENDING  Incomplete  Blood Culture (routine x 2)     Status: None (Preliminary result)   Collection Time: 03/21/20 11:41 PM   Specimen: BLOOD LEFT HAND  Result Value Ref Range Status   Specimen Description BLOOD LEFT HAND  Final   Special Requests   Final    BOTTLES DRAWN AEROBIC AND ANAEROBIC Blood Culture adequate volume   Culture   Final    NO GROWTH 1 DAY Performed at [facility], [place] [address], [place], [place] [address]    Report Status PENDING  Incomplete  Urine culture     Status: None   Collection Time: 03/22/20  1:48 AM   Specimen: In/Out Cath Urine  Result Value Ref Range Status   Specimen Description IN/OUT CATH URINE  Final   Special Requests NONE  Final   Culture   Final    NO GROWTH Performed at [facility], [place] [address], [place], [place] [address]    Report Status 03/22/2020 FINAL  Final  MRSA PCR Screening     Status: None  Collection Time: 03/22/20 12:32 PM   Specimen: Nasal Mucosa; Nasopharyngeal  Result Value Ref Range Status   MRSA by PCR NEGATIVE NEGATIVE Final    Comment:         The GeneXpert MRSA Assay (FDA approved for NASAL specimens only), is one component of a comprehensive MRSA colonization surveillance program. It is not intended to diagnose MRSA infection nor to guide or monitor treatment for MRSA infections. Performed at [facility], [place] [address], [place], [place] [address]     Radiology Reports CT Angio Chest PE W and/or Wo Contrast  Result Date: 03/22/2020 CLINICAL DATA:  Severe back pain hypotension and shallow EXAM: CT ANGIOGRAPHY CHEST WITH CONTRAST TECHNIQUE: Multidetector CT imaging of the chest was performed using the standard protocol during bolus administration of intravenous contrast. Multiplanar CT image reconstructions and MIPs were obtained to evaluate the vascular anatomy. CONTRAST:  58mL OMNIPAQUE IOHEXOL 350 MG/ML SOLN COMPARISON:  None. FINDINGS: Cardiovascular: There is a optimal opacification of the pulmonary arteries. There is no central,segmental, or subsegmental filling defects within the pulmonary arteries. The heart is normal in size. No pericardial effusion or thickening. No evidence right heart strain. There is normal three-vessel brachiocephalic anatomy without proximal stenosis. The thoracic aorta is normal in appearance. Mediastinum/Nodes: No hilar, mediastinal, or axillary adenopathy. Thyroid gland, trachea, and esophagus demonstrate no significant findings. Lungs/Pleura: Multifocal patchy ground-glass opacities are seen predominantly within the periphery of both lungs. No pleural effusion or pneumothorax is seen. Upper Abdomen: No acute abnormalities present in the visualized portions of the upper abdomen. Musculoskeletal: No chest wall abnormality. No acute or significant osseous findings. Review of the MIP images confirms the above findings. Abdomen/pelvis: Hepatobiliary: There is diffuse low density seen throughout the liver parenchyma. The main portal vein is patent. No evidence of calcified gallstones, gallbladder wall  thickening or biliary dilatation. Pancreas: Unremarkable. No pancreatic ductal dilatation or surrounding inflammatory changes. Spleen: Normal in size without focal abnormality. Adrenals/Urinary Tract: Both adrenal glands appear normal. The kidneys and collecting system appear normal without evidence of urinary tract calculus or hydronephrosis. Bladder is unremarkable. Stomach/Bowel: The stomach, small bowel, and colon are normal in appearance. No inflammatory changes, wall thickening, or obstructive findings. There appears to be a surgical anastomosis at the sigmoid rectal junction. Mild presacral soft tissue density is again identified. Vascular/Lymphatic: There are no enlarged mesenteric, retroperitoneal, or pelvic lymph nodes. An infrarenal IVC filter is seen at approximately the L3 level. There is scattered aortic and bi-iliac calcifications noted. Reproductive: The uterus and adnexa are unremarkable. Other: No evidence of abdominal wall mass or hernia. Musculoskeletal: No acute or significant osseous findings. IMPRESSION: No central, segmental, or subsegmental pulmonary embolism. Multifocal patchy airspace opacities throughout both lungs, likely consistent with multifocal pneumonia. Hepatic steatosis Aortic Atherosclerosis (ICD10-I70.0). Electronically Signed   By: Prudencio Pair M.D.   On: 03/22/2020 00:07   MR THORACIC SPINE W WO CONTRAST  Result Date: 03/22/2020 CLINICAL DATA:  Bilateral lower extremity pain and numbness with urinary retention. Diabetes. History of colon cancer. EXAM: MRI THORACIC WITHOUT AND WITH CONTRAST TECHNIQUE: Multiplanar and multiecho pulse sequences of the thoracic spine were obtained without and with intravenous contrast. CONTRAST:  9.7mL GADAVIST GADOBUTROL 1 MMOL/ML IV SOLN COMPARISON:  CT chest done yesterday. FINDINGS: MRI THORACIC SPINE FINDINGS Alignment:  Normal Vertebrae: Normal. No fracture or primary bone lesion. No metastatic disease. Insignificant small hemangioma  within the T11 vertebral body. Cord: No primary cord pathology. See below regarding stenosis at T7-8. Paraspinal  and other soft tissues: Negative Disc levels: No abnormality at T5-6 or above. T6-7: Small right paracentral disc protrusion indents the thecal sac slightly but does not cause apparent neural compression. T7-8: Left posterolateral disc herniation effaces the ventral subarachnoid space and indents the left side of the cord. This could be symptomatic. T8-9 through T12-L1: Normal. 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 Ample subarachnoid space is present dorsal to the cord at this level. None the less, this could possibly be symptomatic. Small right paracentral disc herniation at T6-7 without apparent neural compression. Electronically Signed   By: Nelson Chimes M.D.   On: 03/22/2020 12:32   MR Lumbar Spine W Wo Contrast  Result Date: 03/22/2020 CLINICAL DATA:  Back pain.  Urinary retention.  Diabetes. EXAM: MRI LUMBAR SPINE WITHOUT AND WITH CONTRAST TECHNIQUE: Multiplanar and multiecho pulse sequences of the lumbar spine were obtained without and with intravenous contrast. CONTRAST:  9.58mL GADAVIST GADOBUTROL 1 MMOL/ML IV SOLN COMPARISON:  None. FINDINGS: Segmentation:  5 lumbar type vertebral bodies. Alignment:  Slightly exaggerated lower lumbar lordosis. Vertebrae:  No fracture or primary bone lesion. Conus medullaris and cauda equina: Conus extends to the L1 level. Conus and cauda equina appear normal. Paraspinal and other soft tissues: There appears be thrombosis of the IVC and both iliac veins up to the level of the IVC filter. Flow is restored in the IVC by inflow from the renal veins. Disc levels: No abnormality from L1-2 through L2-3. L3-4: No disc abnormality.  Mild facet hypertrophy.  No stenosis. L4-5: No disc abnormality. Mild to moderate facet hypertrophy. No stenosis. L5-S1:  Minimal disc bulge.  Mild facet hypertrophy.  No stenosis. IMPRESSION: No significant disc pathology. No stenosis or neural compression in the lumbar region. Patient has exaggerated lumbar lordosis. 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 Flow is restored in the IVC above that by inflow from the renal veins. I am in the process of calling this report. Electronically Signed   By: Nelson Chimes M.D.   On: 03/22/2020 12:46   CT ABDOMEN PELVIS W CONTRAST  Result Date: 03/22/2020 CLINICAL DATA:  Severe back pain hypotension and shallow EXAM: CT ANGIOGRAPHY CHEST WITH CONTRAST TECHNIQUE: Multidetector CT imaging of the chest was performed using the standard protocol during bolus administration of intravenous contrast. Multiplanar CT image reconstructions and MIPs were obtained to evaluate the vascular anatomy. CONTRAST:  29mL OMNIPAQUE IOHEXOL 350 MG/ML SOLN COMPARISON:  None. FINDINGS: Cardiovascular: There is a optimal opacification of the pulmonary arteries. There is no central,segmental, or subsegmental filling defects within the pulmonary arteries. The heart is normal in size. No pericardial effusion or thickening. No evidence right heart strain. There is normal three-vessel brachiocephalic anatomy without proximal stenosis. The thoracic aorta is normal in appearance. Mediastinum/Nodes: No hilar, mediastinal, or axillary adenopathy. Thyroid gland, trachea, and esophagus demonstrate no significant findings. Lungs/Pleura: Multifocal patchy ground-glass opacities are seen predominantly within the periphery of both lungs. No pleural effusion or pneumothorax is seen. Upper Abdomen: No acute abnormalities present in the visualized portions of the upper abdomen. Musculoskeletal: No chest wall abnormality. No acute or significant  osseous findings. Review of the MIP images confirms the above findings. Abdomen/pelvis: Hepatobiliary:  There is diffuse low density seen throughout the liver parenchyma. The main portal vein is patent. No evidence of calcified gallstones, gallbladder wall thickening or biliary dilatation. Pancreas: Unremarkable. No pancreatic ductal dilatation or surrounding inflammatory changes. Spleen: Normal in size without focal abnormality. Adrenals/Urinary Tract: Both adrenal glands appear normal. The kidneys and collecting system appear normal without evidence of urinary tract calculus or hydronephrosis. Bladder is unremarkable. Stomach/Bowel: The stomach, small bowel, and colon are normal in appearance. No inflammatory changes, wall thickening, or obstructive findings. There appears to be a surgical anastomosis at the sigmoid rectal junction. Mild presacral soft tissue density is again identified. Vascular/Lymphatic: There are no enlarged mesenteric, retroperitoneal, or pelvic lymph nodes. An infrarenal IVC filter is seen at approximately the L3 level. There is scattered aortic and bi-iliac calcifications noted. Reproductive: The uterus and adnexa are unremarkable. Other: No evidence of abdominal wall mass or hernia. Musculoskeletal: No acute or significant osseous findings. IMPRESSION: No central, segmental, or subsegmental pulmonary embolism. Multifocal patchy airspace opacities throughout both lungs, likely consistent with multifocal pneumonia. Hepatic steatosis Aortic Atherosclerosis (ICD10-I70.0). Electronically Signed   By: Prudencio Pair M.D.   On: 03/22/2020 00:07   DG Chest Port 1 View  Result Date: 03/21/2020 CLINICAL DATA:  54 year old female with weakness and hypotension. EXAM: PORTABLE CHEST 1 VIEW COMPARISON:  Chest radiograph dated 03/25/2016 FINDINGS: Faint bilateral peripheral and subpleural densities, right greater left concerning for pneumonia, possibly atypical or viral in etiology including COVID-19.  Clinical correlation is recommended. No focal consolidation, pleural effusion, pneumothorax. The cardiac silhouette is within limits. No acute osseous pathology. IMPRESSION: Findings concerning for pneumonia. Electronically Signed   By: Anner Crete M.D.   On: 03/21/2020 22:17   VAS [place] LOWER EXTREMITY VENOUS (DVT)  Result Date: 03/22/2020  Lower Venous DVTStudy Indications: Hx DVT and IVC filter, now with COVID and elevated D dimer.  Comparison Study: 06/07/15 negative Performing Technologist: June Leap RDMS, RVT  Examination Guidelines: A complete evaluation includes B-mode imaging, spectral Doppler, color Doppler, and power Doppler as needed of all accessible portions of each vessel. Bilateral testing is considered an integral part of a complete examination. Limited examinations for reoccurring indications may be performed as noted. The reflux portion of the exam is performed with the patient in reverse Trendelenburg.  +---------+---------------+---------+-----------+----------+-----------------+ RIGHT    CompressibilityPhasicitySpontaneityPropertiesThrombus Aging    +---------+---------------+---------+-----------+----------+-----------------+ CFV      None           No       No                   Acute             +---------+---------------+---------+-----------+----------+-----------------+ SFJ      None                                         Acute             +---------+---------------+---------+-----------+----------+-----------------+ FV Prox  None                                         Acute             +---------+---------------+---------+-----------+----------+-----------------+ FV Mid   None  Acute             +---------+---------------+---------+-----------+----------+-----------------+ FV DistalNone                                         Acute              +---------+---------------+---------+-----------+----------+-----------------+ PFV      None                                         Acute             +---------+---------------+---------+-----------+----------+-----------------+ POP      None           No       No                   Acute             +---------+---------------+---------+-----------+----------+-----------------+ PTV      None                                         Acute             +---------+---------------+---------+-----------+----------+-----------------+ PERO     None                                         Acute             +---------+---------------+---------+-----------+----------+-----------------+ Gastroc  None                                         Acute             +---------+---------------+---------+-----------+----------+-----------------+ GSV      None                                         Acute             +---------+---------------+---------+-----------+----------+-----------------+ Ex Iliac                No       No                   Age Indeterminate +---------+---------------+---------+-----------+----------+-----------------+ Com Iliac                                             Not visualized    +---------+---------------+---------+-----------+----------+-----------------+   +---------+---------------+---------+-----------+----------+-----------------+ LEFT     CompressibilityPhasicitySpontaneityPropertiesThrombus Aging    +---------+---------------+---------+-----------+----------+-----------------+ CFV      None           No       No                   Acute             +---------+---------------+---------+-----------+----------+-----------------+ SFJ  None                                         Acute             +---------+---------------+---------+-----------+----------+-----------------+ FV Prox  None                                          Acute             +---------+---------------+---------+-----------+----------+-----------------+ FV Mid   None                                         Acute             +---------+---------------+---------+-----------+----------+-----------------+ FV DistalNone                                         Acute             +---------+---------------+---------+-----------+----------+-----------------+ PFV      None                                         Acute             +---------+---------------+---------+-----------+----------+-----------------+ POP      None           No       No                   Acute             +---------+---------------+---------+-----------+----------+-----------------+ PTV      None                                         Acute             +---------+---------------+---------+-----------+----------+-----------------+ PERO     None                                         Acute             +---------+---------------+---------+-----------+----------+-----------------+ Gastroc  None                                         Acute             +---------+---------------+---------+-----------+----------+-----------------+ GSV      None                                         Acute             +---------+---------------+---------+-----------+----------+-----------------+ Ex Iliac  No       No                   Age Indeterminate +---------+---------------+---------+-----------+----------+-----------------+ Com Iliac                                             Not visualized    +---------+---------------+---------+-----------+----------+-----------------+     Summary: RIGHT: - Findings consistent with acute deep vein thrombosis involving the right common femoral vein, SF junction, right femoral vein, right proximal profunda vein, right popliteal vein, right posterior tibial veins, right peroneal veins, and right  gastrocnemius veins. - Findings consistent with acute superficial vein thrombosis involving the right great saphenous vein. - Thrombosis extends into the Right Iliac vein and IVC up to the level of the filter placement.  LEFT: - Findings consistent with acute deep vein thrombosis involving the left common femoral vein, SF junction, left femoral vein, left proximal profunda vein, left popliteal vein, left posterior tibial veins, left peroneal veins, and left gastrocnemius veins. - Findings consistent with acute superficial vein thrombosis involving the left great saphenous vein. - Thrombosis extends into the Left Iliac vein.  *See table(s) above for measurements and observations. Electronically signed by Curt Jews MD on 03/22/2020 at 5:19:17 PM.    Final      Phillips Climes M.D on 03/23/2020 at 12:32 PM    [facility] -  Office  [phone]

## 2020-03-23 NOTE — Progress Notes (Signed)
  Progress Note    03/23/2020 1:41 PM * No surgery found *  Subjective:  She is having numbness in her bilateral ankles but she is having improved movement of her legs  Vitals:   03/23/20 0850 03/23/20 1108  BP: 104/65 106/75  Pulse: 92 90  Resp: 14 14  Temp: (!) 97.5 F (36.4 C) 97.9 F (36.6 C)  SpO2: 98% 95%    Physical Exam: Awake alert oriented Abdomen soft Legs are edematous however all compartments are soft Palpable dorsalis pedis pulses bilaterally  CBC    Component Value Date/Time   WBC 14.6 (H) 03/23/2020 0548   RBC 4.14 03/23/2020 0548   HGB 12.1 03/23/2020 0548   HGB 13.3 12/17/2013 1538   HCT 38.4 03/23/2020 0548   HCT 40.6 12/17/2013 1538   PLT 141 (L) 03/23/2020 0548   PLT 219 12/17/2013 1538   MCV 92.8 03/23/2020 0548   MCV 90.3 12/17/2013 1538   MCH 29.2 03/23/2020 0548   MCHC 31.5 03/23/2020 0548   RDW 13.8 03/23/2020 0548   RDW 13.5 12/17/2013 1538   LYMPHSABS 0.8 03/23/2020 0548   LYMPHSABS 1.4 12/17/2013 1538   MONOABS 0.2 03/23/2020 0548   MONOABS 0.3 12/17/2013 1538   EOSABS 0.0 03/23/2020 0548   EOSABS 0.0 12/17/2013 1538   BASOSABS 0.0 03/23/2020 0548   BASOSABS 0.0 12/17/2013 1538    BMET    Component Value Date/Time   NA 137 03/23/2020 1224   NA 144 01/21/2018 1000   NA 142 12/17/2013 1538   K 4.1 03/23/2020 1224   K 4.1 12/17/2013 1538   CL 103 03/23/2020 1224   CL 106 12/07/2012 0838   CO2 20 (L) 03/23/2020 1224   CO2 28 12/17/2013 1538   GLUCOSE 224 (H) 03/23/2020 1224   GLUCOSE 123 12/17/2013 1538   GLUCOSE 119 (H) 12/07/2012 0838   BUN 22 (H) 03/23/2020 1224   BUN 12 01/21/2018 1000   BUN 14.1 12/17/2013 1538   CREATININE 0.85 03/23/2020 1224   CREATININE 0.96 10/27/2015 0904   CREATININE 0.8 12/17/2013 1538   CALCIUM 8.3 (L) 03/23/2020 1224   CALCIUM 9.7 12/17/2013 1538   GFRNONAA >60 03/23/2020 1224   GFRAA >60 03/23/2020 1224    INR    Component Value Date/Time   INR 1.5 (H) 03/21/2020 2143   INR  2.10 12/07/2012 1025     Intake/Output Summary (Last 24 hours) at 03/23/2020 1341 Last data filed at 03/23/2020 1256 Gross per 24 hour  Intake 3584.18 ml  Output 1275 ml  Net 2309.18 ml     Assessment/plan:  54 y.o. female is here included up [facility] catheter.  She is Covid positive likely contributing to this.  I discussed with her the risks of proceeding with mechanical thrombectomy versus not proceeding.  She will likely need lytic catheter placement in the bilateral popliteal fossae with subsequent angiogram thrombectomy.  This will be a 2-day procedure and will likely occur as early next week.  I will discuss with her husband and figure out timing with the operation schedule.   Kashmir Leedy C. Donzetta Matters, MD [facility] Office: [phone] Pager: [phone]  03/23/2020 1:41 PM

## 2020-03-23 NOTE — Progress Notes (Signed)
ANTICOAGULATION CONSULT NOTE - Follow Up Consult  Pharmacy Consult for Heparin Indication:  DVT, D-dimer > 20, Covid  Allergies  Allergen Reactions  . Metformin And Related Other (See Comments)    Chest pain  . Penicillins Hives    Patient Measurements: Height: 5\' 11"  (180.3 cm) Weight: 94.2 kg (207 lb 10.8 oz) IBW/kg (Calculated) : 70.8   Vital Signs: Temp: 98.3 F (36.8 C) (06/03 0400) Temp Source: Oral (06/03 0400) BP: 104/72 (06/03 0400) Pulse Rate: 77 (06/03 0400)  Labs: Recent Labs    03/21/20 2143 03/21/20 2143 03/21/20 2235 03/21/20 2235 03/21/20 2336 03/22/20 0241 03/22/20 2219 03/23/20 0548  HGB 13.3   < > 13.6   < >  --  12.6  --  12.1  HCT 41.8   < > 40.0  --   --  40.6  --  38.4  PLT 270  --   --   --   --  208  --  141*  APTT 52*  --   --   --   --   --   --   --   LABPROT 17.3*  --   --   --   --   --   --   --   INR 1.5*  --   --   --   --   --   --   --   HEPARINUNFRC  --   --   --   --   --   --  1.00* 0.69  CREATININE 1.43*   < > 1.20*  --   --  1.24* 1.06*  --   CKTOTAL  --   --   --   --  109  --   --   --   TROPONINIHS <2  --   --   --   --   --   --   --    < > = values in this interval not displayed.    Estimated Creatinine Clearance: 77.7 mL/min (A) (by C-G formula based on SCr of 1.06 mg/dL (H)).  Assessment: 54 year old female Covid + continues on  heparin for extensive DVT  Heparin level at goal this morning CBC stable  Goal of Therapy:  Heparin level 0.3-0.7 units/ml Monitor platelets by anticoagulation protocol: Yes   Plan:  Continue heparin at 950 units / hr Daily heparin level, CBC  Thank you Anette Guarneri, PharmD 03/23/2020,8:03 AM

## 2020-03-23 NOTE — Plan of Care (Signed)
  Problem: Clinical Measurements: Goal: Respiratory complications will improve Outcome: Progressing   

## 2020-03-24 DIAGNOSIS — N179 Acute kidney failure, unspecified: Secondary | ICD-10-CM

## 2020-03-24 DIAGNOSIS — I82403 Acute embolism and thrombosis of unspecified deep veins of lower extremity, bilateral: Secondary | ICD-10-CM

## 2020-03-24 LAB — CBC WITH DIFFERENTIAL/PLATELET
Abs Immature Granulocytes: 0.07 10*3/uL (ref 0.00–0.07)
Basophils Absolute: 0 10*3/uL (ref 0.0–0.1)
Basophils Relative: 0 %
Eosinophils Absolute: 0 10*3/uL (ref 0.0–0.5)
Eosinophils Relative: 0 %
HCT: 36.4 % (ref 36.0–46.0)
Hemoglobin: 11.8 g/dL — ABNORMAL LOW (ref 12.0–15.0)
Immature Granulocytes: 1 %
Lymphocytes Relative: 6 %
Lymphs Abs: 0.6 10*3/uL — ABNORMAL LOW (ref 0.7–4.0)
MCH: 29.8 pg (ref 26.0–34.0)
MCHC: 32.4 g/dL (ref 30.0–36.0)
MCV: 91.9 fL (ref 80.0–100.0)
Monocytes Absolute: 0.1 10*3/uL (ref 0.1–1.0)
Monocytes Relative: 1 %
Neutro Abs: 8.8 10*3/uL — ABNORMAL HIGH (ref 1.7–7.7)
Neutrophils Relative %: 92 %
Platelets: 164 10*3/uL (ref 150–400)
RBC: 3.96 MIL/uL (ref 3.87–5.11)
RDW: 13.9 % (ref 11.5–15.5)
WBC: 9.5 10*3/uL (ref 4.0–10.5)
nRBC: 0 % (ref 0.0–0.2)

## 2020-03-24 LAB — HEPARIN LEVEL (UNFRACTIONATED): Heparin Unfractionated: 0.48 IU/mL (ref 0.30–0.70)

## 2020-03-24 LAB — COMPREHENSIVE METABOLIC PANEL
ALT: 21 U/L (ref 0–44)
AST: 22 U/L (ref 15–41)
Albumin: 2.8 g/dL — ABNORMAL LOW (ref 3.5–5.0)
Alkaline Phosphatase: 84 U/L (ref 38–126)
Anion gap: 10 (ref 5–15)
BUN: 24 mg/dL — ABNORMAL HIGH (ref 6–20)
CO2: 23 mmol/L (ref 22–32)
Calcium: 8.6 mg/dL — ABNORMAL LOW (ref 8.9–10.3)
Chloride: 101 mmol/L (ref 98–111)
Creatinine, Ser: 0.79 mg/dL (ref 0.44–1.00)
GFR calc Af Amer: >60 mL/min (ref >60)
GFR calc non Af Amer: >60 mL/min (ref >60)
Glucose, Bld: 329 mg/dL — ABNORMAL HIGH (ref 70–99)
Potassium: 4 mmol/L (ref 3.5–5.1)
Sodium: 134 mmol/L — ABNORMAL LOW (ref 135–145)
Total Bilirubin: 0.7 mg/dL (ref 0.3–1.2)
Total Protein: 6.5 g/dL (ref 6.5–8.1)

## 2020-03-24 LAB — GLUCOSE, CAPILLARY
Glucose-Capillary: 283 mg/dL — ABNORMAL HIGH (ref 70–99)
Glucose-Capillary: 254 mg/dL — ABNORMAL HIGH (ref 70–99)
Glucose-Capillary: 187 mg/dL — ABNORMAL HIGH (ref 70–99)
Glucose-Capillary: 243 mg/dL — ABNORMAL HIGH (ref 70–99)

## 2020-03-24 LAB — C-REACTIVE PROTEIN: CRP: 6.1 mg/dL — ABNORMAL HIGH (ref <1.0)

## 2020-03-24 LAB — D-DIMER, QUANTITATIVE: D-Dimer, Quant: 17.19 ug/mL-FEU — ABNORMAL HIGH (ref 0.00–0.50)

## 2020-03-24 MED ORDER — LOPERAMIDE HCL 2 MG PO CAPS
2.0000 mg | ORAL_CAPSULE | ORAL | Status: DC | PRN
  Administered 2020-03-24 – 2020-03-27 (×5): 2 mg via ORAL
  Filled 2020-03-24 (×6): qty 1

## 2020-03-24 MED ORDER — INSULIN GLARGINE 100 UNIT/ML ~~LOC~~ SOLN
15.0000 [IU] | Freq: Every day | SUBCUTANEOUS | Status: DC
  Filled 2020-03-24: qty 0.15

## 2020-03-24 MED ORDER — INSULIN GLARGINE 100 UNIT/ML ~~LOC~~ SOLN
4.0000 [IU] | Freq: Once | SUBCUTANEOUS | Status: AC
  Administered 2020-03-24: 4 [IU] via SUBCUTANEOUS
  Filled 2020-03-24: qty 0.04

## 2020-03-24 NOTE — Progress Notes (Signed)
PROGRESS NOTE                                                                                                                                                                                                             Patient Demographics:    [name], is a 54 y.o. female, DOB - 1966-02-18, TML:[id number]  Admit date - 03/21/2020   Admitting Physician Lenore Cordia, MD  Outpatient Primary MD for the patient is Glendale Chard, MD  LOS - 2   Chief Complaint  Patient presents with  . Hypotension  . Back Pain       Brief Narrative    [name] is a 54 y.o. female with medical history significant for type 2 diabetes, hypothyroidism, hyperlipidemia, history of PE, and history of colon cancer who presents to the ED for evaluation of new onset low back pain with lower extremity weakness and change in sensation, she was recently diagnosed with COVID-19 on 03/10/2020.  She did not require hospitalization or specific treatment at that time however has been having progressive fatigue and malaise since then.  She was given antibiotics for possible UTI earlier today.    Is presented to ED 6/1 secondary to complaints of lower back pain and weakness in both of her legs.  CTA chest PE study and CT abdomen/pelvis with contrast is negative for PE.  Focal patchy airspace opacities are seen throughout both lungs.  Hepatic steatosis is noted. -MRI thoracic/lumbar spine were significant for T7 disc herniation, does not with any focal deficits, but it did show evidence of IVC filter clotting, had venous Doppler showing extensive DVT in the bilateral lower extremities for which vascular surgeon has been consulted.     Subjective:    [name] today has any dyspnea,She denies cough or duspnea today.   Assessment  & Plan :    Principal Problem:   Sepsis due to COVID-19 [facility]) Active Problems:   Hyperlipidemia   Hypothyroidism   Type 2 diabetes mellitus  without complication, without long-term current use of insulin ([place])     Sepsis due to COVID-19 pneumonitis - SARS-CoV-2 PCR positive on 03/11/2019.  Has been having progressive fatigue, weakness.  CT imaging shows multifocal infiltrates. -Patient with no hypoxia, but there is imaging significant for multifocal opacity. -Continue with IV steroids. -Continue with IV remdesivir. -  Encouraged to use incentive spirometer, she is currently now at bedrest.  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 -Vascular surgery consult greatly appreciated, for now continue with full anticoagulation, patient will need mechanical thrombectomy with possible lysis, to be done next Tuesday/Wednesday at 2 days procedures, and where she will require ICU stay then .  Acute kidney injury: Likely secondary to sepsis.  ,  Resolved.  Thoracic radiculopathy -Patient initially presented with lower extremity weakness, this is most likely related to limited range of motion due to pain from her, discussed MRI findings with Dr. Arnoldo Morale, who reviewed her MRI thoracic/lumbar spine, there is no emergency, and patient can follow with neurosurgery as an outpatient if it does cause any symptoms.  Hyperglycemia and type 2 diabetes with mild DKA : -She is initially on insulin drip, this has been stopped, he has resolved. -CBG uncontrolled, will go ahead and increase her Lantus from 10 to 15 units, continue with insulin sliding scale as well . she is currently transitioned to Lantus 10 units daily, insulin sliding scale with good control. -Started on Jardiance. -Beta hydroxybutyric acid has normalized. -Hemoglobin A1c is 8.6 -Continue with bicarb drip, as her bicarb has normalized  Hyperkalemia: K5.5 in setting of AKI.  Give Lokelma 10 mg once.    Resolved with hydration  Hypothyroidism: Continue Synthroid.     Hyperlipidemia: Continue simvastatin.    COVID-19 Labs  Recent Labs    03/22/20 0241 03/22/20 0814 03/23/20 0548 03/24/20 0834  DDIMER >20.00*  --  >20.00* 17.19*  FERRITIN 254  --  252  --   CRP  --  6.6*  --  6.1*    Lab Results  Component Value Date   SARSCOV2NAA POSITIVE (A) 03/10/2020     Code Status : Full   Family Communication  :  D/W husband via phone 6/3.  Disposition Plan  :  Status is: Inpatient  Remains inpatient appropriate because:IV treatments appropriate due to intensity of illness or inability to take PO   Dispo: The patient is from: Home              Anticipated d/c is to: Home              Anticipated d/c date is: 3 days              Patient currently is not medically stable to d/c.      Consults  :  Vascular Surgery  Procedures  : None  DVT Prophylaxis  :  Heparin GTT  Lab Results  Component Value Date   PLT 164 03/24/2020    Antibiotics  :    Anti-infectives (From admission, onward)   Start     Dose/Rate Route Frequency Ordered Stop   03/23/20 2200  azithromycin (ZITHROMAX) tablet 500 mg     500 mg Oral Daily at bedtime 03/23/20 0806     03/23/20 1000  remdesivir 100 mg in sodium chloride 0.9 % 100 mL IVPB     100 mg 200 mL/hr over 30 Minutes Intravenous Daily 03/22/20 0110 03/27/20 0959   03/22/20 2200  azithromycin (ZITHROMAX) 500 mg in sodium chloride 0.9 % 250 mL IVPB  Status:  Discontinued     500 mg 250 mL/hr over 60 Minutes Intravenous Every 24 hours 03/22/20 0111 03/23/20 0806   03/22/20 2200  cefTRIAXone (ROCEPHIN) 1 g in sodium chloride 0.9 % 100 mL IVPB     1  g 200 mL/hr over 30 Minutes Intravenous Every 24 hours 03/22/20 0111     03/22/20 0200  remdesivir 200 mg in sodium chloride 0.9% 250 mL IVPB     200 mg 580 mL/hr over 30 Minutes Intravenous Once 03/22/20 0110 03/22/20 1900   03/22/20 0015  cefTRIAXone (ROCEPHIN) 1 g in sodium chloride 0.9 % 100 mL IVPB     1 g 200 mL/hr over 30 Minutes Intravenous   Once 03/22/20 0010 03/22/20 0103   03/21/20 2300  cefTRIAXone (ROCEPHIN) injection 1 g  Status:  Discontinued     1 g Intramuscular  Once 03/21/20 2253 03/22/20 0010   03/21/20 2300  azithromycin (ZITHROMAX) 500 mg in sodium chloride 0.9 % 250 mL IVPB     500 mg 250 mL/hr over 60 Minutes Intravenous  Once 03/21/20 2253 03/22/20 0129        Objective:   Vitals:   03/23/20 1923 03/24/20 0000 03/24/20 0306 03/24/20 0840  BP: 99/67 105/68 104/74 105/61  Pulse: 83 76 65 81  Resp: 18 15 13 12   Temp: 98.7 F (37.1 C) 98.4 F (36.9 C) 97.9 F (36.6 C) 97.8 F (36.6 C)  TempSrc: Oral Oral Oral Oral  SpO2: 98% 93% 98% 92%  Weight:      Height:        Wt Readings from Last 3 Encounters:  03/22/20 94.2 kg  02/15/20 91.2 kg  08/02/19 95.3 kg     Intake/Output Summary (Last 24 hours) at 03/24/2020 1206 Last data filed at 03/24/2020 0757 Gross per 24 hour  Intake 1019.08 ml  Output 1050 ml  Net -30.92 ml     Physical Exam  Awake Alert, Oriented X 3, No new F.N deficits, Normal affect Symmetrical Chest wall movement, Good air movement bilaterally, CTAB RRR,No Gallops,Rubs or new Murmurs, No Parasternal Heave +ve B.Sounds, Abd Soft, No tenderness, No rebound - guarding or rigidity. No Cyanosis, has bilateral lower extremity edema     Data Review:    CBC Recent Labs  Lab 03/21/20 2143 03/21/20 2235 03/22/20 0241 03/23/20 0548 03/24/20 0834  WBC 15.2*  --  15.1* 14.6* 9.5  HGB 13.3 13.6 12.6 12.1 11.8*  HCT 41.8 40.0 40.6 38.4 36.4  PLT 270  --  208 141* 164  MCV 93.3  --  95.1 92.8 91.9  MCH 29.7  --  29.5 29.2 29.8  MCHC 31.8  --  31.0 31.5 32.4  RDW 13.2  --  13.5 13.8 13.9  LYMPHSABS 1.3  --  0.8 0.8 0.6*  MONOABS 0.6  --  0.3 0.2 0.1  EOSABS 0.0  --  0.0 0.0 0.0  BASOSABS 0.0  --  0.0 0.0 0.0    Chemistries  Recent Labs  Lab 03/21/20 2143 03/21/20 2143 03/21/20 2235 03/21/20 2235 03/21/20 2336 03/22/20 0241 03/22/20 2219 03/23/20 1224  03/24/20 0834  NA 136   < > 137  --   --  139 137 137 134*  K 5.9*   < > 4.5   < > 5.5* 4.0 4.7 4.1 4.0  CL 105  --   --   --   --  112* 106 103 101  CO2 15*  --   --   --   --  11* 20* 20* 23  GLUCOSE 368*  --   --   --   --  288* 206* 224* 329*  BUN 31*  --   --   --   --  26* 26* 22*  24*  CREATININE 1.43*   < > 1.20*  --   --  1.24* 1.06* 0.85 0.79  CALCIUM 8.7*  --   --   --   --  8.0* 8.2* 8.3* 8.6*  MG  --   --   --   --   --  1.5*  --  2.0  --   AST 31  --   --   --   --  17  --  23 22  ALT 23  --   --   --   --  19  --  19 21  ALKPHOS 74  --   --   --   --  77  --  74 84  BILITOT 1.8*  --   --   --   --  0.8  --  0.9 0.7   < > = values in this interval not displayed.   ------------------------------------------------------------------------------------------------------------------ No results for input(s): CHOL, HDL, LDLCALC, TRIG, CHOLHDL, LDLDIRECT in the last 72 hours.  Lab Results  Component Value Date   HGBA1C 8.6 (H) 03/23/2020   ------------------------------------------------------------------------------------------------------------------ Recent Labs    03/22/20 0241  TSH 10.445*   ------------------------------------------------------------------------------------------------------------------ Recent Labs    03/22/20 0241 03/23/20 0548  FERRITIN 254 252    Coagulation profile Recent Labs  Lab 03/21/20 2143  INR 1.5*    Recent Labs    03/23/20 0548 03/24/20 0834  DDIMER >20.00* 17.19*    Cardiac Enzymes No results for input(s): CKMB, TROPONINI, MYOGLOBIN in the last 168 hours.  Invalid input(s): CK ------------------------------------------------------------------------------------------------------------------    Component Value Date/Time   BNP 29.9 03/21/2020 2148    Inpatient Medications  Scheduled Meds: . vitamin C  500 mg Oral Daily  . azithromycin  500 mg Oral QHS  . Chlorhexidine Gluconate Cloth  6 each Topical Daily  .  dexamethasone (DECADRON) injection  6 mg Intravenous Q24H  . famotidine  20 mg Oral BID  . feeding supplement (GLUCERNA SHAKE)  237 mL Oral TID BM  . insulin aspart  0-15 Units Subcutaneous TID WC  . insulin aspart  0-5 Units Subcutaneous QHS  . insulin glargine  10 Units Subcutaneous Daily  . Ipratropium-Albuterol  1 puff Inhalation Q6H  . levothyroxine  100 mcg Oral Q0600  . linagliptin  5 mg Oral Daily  . simvastatin  10 mg Oral Daily  . sodium chloride flush  3 mL Intravenous Q12H  . zinc sulfate  220 mg Oral Daily   Continuous Infusions: . cefTRIAXone (ROCEPHIN)  IV Stopped (03/24/20 0757)  . heparin 950 Units/hr (03/23/20 1108)  . remdesivir 100 mg in NS 100 mL 100 mg (03/24/20 1158)   PRN Meds:.acetaminophen, chlorpheniramine-HYDROcodone, dextrose, guaiFENesin-dextromethorphan, HYDROmorphone (DILAUDID) injection, ondansetron **OR** ondansetron (ZOFRAN) IV, oxyCODONE, senna-docusate, zolpidem  Micro Results Recent Results (from the past 240 hour(s))  Blood Culture (routine x 2)     Status: None (Preliminary result)   Collection Time: 03/21/20 10:00 PM   Specimen: BLOOD  Result Value Ref Range Status   Specimen Description BLOOD LEFT ANTECUBITAL  Final   Special Requests   Final    BOTTLES DRAWN AEROBIC AND ANAEROBIC Blood Culture results may not be optimal due to an inadequate volume of blood received in culture bottles   Culture   Final    NO GROWTH 2 DAYS Performed at [facility], [place] [address], [place], [place] [address]    Report Status PENDING  Incomplete  Blood Culture (routine x 2)  Status: None (Preliminary result)   Collection Time: 03/21/20 11:41 PM   Specimen: BLOOD LEFT HAND  Result Value Ref Range Status   Specimen Description BLOOD LEFT HAND  Final   Special Requests   Final    BOTTLES DRAWN AEROBIC AND ANAEROBIC Blood Culture adequate volume   Culture   Final    NO GROWTH 1 DAY Performed at [facility], [address],  [place], [place] [address]    Report Status PENDING  Incomplete  Urine culture     Status: None   Collection Time: 03/22/20  1:48 AM   Specimen: In/Out Cath Urine  Result Value Ref Range Status   Specimen Description IN/OUT CATH URINE  Final   Special Requests NONE  Final   Culture   Final    NO GROWTH Performed at [facility], [place] [address], [place], [place] [address]    Report Status 03/22/2020 FINAL  Final  MRSA PCR Screening     Status: None   Collection Time: 03/22/20 12:32 PM   Specimen: Nasal Mucosa; Nasopharyngeal  Result Value Ref Range Status   MRSA by PCR NEGATIVE NEGATIVE Final    Comment:        The GeneXpert MRSA Assay (FDA approved for NASAL specimens only), is one component of a comprehensive MRSA colonization surveillance program. It is not intended to diagnose MRSA infection nor to guide or monitor treatment for MRSA infections. Performed at [facility], [place] [address], [place], [place] [address]     Radiology Reports CT Angio Chest PE W and/or Wo Contrast  Result Date: 03/22/2020 CLINICAL DATA:  Severe back pain hypotension and shallow EXAM: CT ANGIOGRAPHY CHEST WITH CONTRAST TECHNIQUE: Multidetector CT imaging of the chest was performed using the standard protocol during bolus administration of intravenous contrast. Multiplanar CT image reconstructions and MIPs were obtained to evaluate the vascular anatomy. CONTRAST:  57mL OMNIPAQUE IOHEXOL 350 MG/ML SOLN COMPARISON:  None. FINDINGS: Cardiovascular: There is a optimal opacification of the pulmonary arteries. There is no central,segmental, or subsegmental filling defects within the pulmonary arteries. The heart is normal in size. No pericardial effusion or thickening. No evidence right heart strain. There is normal three-vessel brachiocephalic anatomy without proximal stenosis. The thoracic aorta is normal in appearance. Mediastinum/Nodes: No hilar, mediastinal, or axillary adenopathy. Thyroid gland,  trachea, and esophagus demonstrate no significant findings. Lungs/Pleura: Multifocal patchy ground-glass opacities are seen predominantly within the periphery of both lungs. No pleural effusion or pneumothorax is seen. Upper Abdomen: No acute abnormalities present in the visualized portions of the upper abdomen. Musculoskeletal: No chest wall abnormality. No acute or significant osseous findings. Review of the MIP images confirms the above findings. Abdomen/pelvis: Hepatobiliary: There is diffuse low density seen throughout the liver parenchyma. The main portal vein is patent. No evidence of calcified gallstones, gallbladder wall thickening or biliary dilatation. Pancreas: Unremarkable. No pancreatic ductal dilatation or surrounding inflammatory changes. Spleen: Normal in size without focal abnormality. Adrenals/Urinary Tract: Both adrenal glands appear normal. The kidneys and collecting system appear normal without evidence of urinary tract calculus or hydronephrosis. Bladder is unremarkable. Stomach/Bowel: The stomach, small bowel, and colon are normal in appearance. No inflammatory changes, wall thickening, or obstructive findings. There appears to be a surgical anastomosis at the sigmoid rectal junction. Mild presacral soft tissue density is again identified. Vascular/Lymphatic: There are no enlarged mesenteric, retroperitoneal, or pelvic lymph nodes. An infrarenal IVC filter is seen at approximately the L3 level. There is scattered  aortic and bi-iliac calcifications noted. Reproductive: The uterus and adnexa are unremarkable. Other: No evidence of abdominal wall mass or hernia. Musculoskeletal: No acute or significant osseous findings. IMPRESSION: No central, segmental, or subsegmental pulmonary embolism. Multifocal patchy airspace opacities throughout both lungs, likely consistent with multifocal pneumonia. Hepatic steatosis Aortic Atherosclerosis (ICD10-I70.0). Electronically Signed   By: Prudencio Pair M.D.    On: 03/22/2020 00:07   MR THORACIC SPINE W WO CONTRAST  Result Date: 03/22/2020 CLINICAL DATA:  Bilateral lower extremity pain and numbness with urinary retention. Diabetes. History of colon cancer. EXAM: MRI THORACIC WITHOUT AND WITH CONTRAST TECHNIQUE: Multiplanar and multiecho pulse sequences of the thoracic spine were obtained without and with intravenous contrast. CONTRAST:  9.60mL GADAVIST GADOBUTROL 1 MMOL/ML IV SOLN COMPARISON:  CT chest done yesterday. FINDINGS: MRI THORACIC SPINE FINDINGS Alignment:  Normal Vertebrae: Normal. No fracture or primary bone lesion. No metastatic disease. Insignificant small hemangioma within the T11 vertebral body. Cord: No primary cord pathology. See below regarding stenosis at T7-8. Paraspinal and other soft tissues: Negative Disc levels: No abnormality at T5-6 or above. T6-7: Small right paracentral disc protrusion indents the thecal sac slightly but does not cause apparent neural compression. T7-8: Left posterolateral disc herniation effaces the ventral subarachnoid space and indents the left side of the cord. This could be symptomatic. T8-9 through T12-L1: Normal. 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 Ample subarachnoid space is present dorsal to the cord at this level. None the less, this could possibly be symptomatic. Small right paracentral disc herniation at T6-7 without apparent neural compression. Electronically Signed   By: Nelson Chimes M.D.   On: 03/22/2020 12:32   MR Lumbar Spine W Wo Contrast  Result Date: 03/22/2020 CLINICAL DATA:  Back pain.  Urinary retention.  Diabetes. EXAM: MRI LUMBAR SPINE WITHOUT AND WITH CONTRAST TECHNIQUE: Multiplanar and multiecho pulse sequences of the lumbar spine were obtained without and with intravenous contrast. CONTRAST:  9.86mL GADAVIST GADOBUTROL 1 MMOL/ML IV SOLN COMPARISON:  None. FINDINGS:  Segmentation:  5 lumbar type vertebral bodies. Alignment:  Slightly exaggerated lower lumbar lordosis. Vertebrae:  No fracture or primary bone lesion. Conus medullaris and cauda equina: Conus extends to the L1 level. Conus and cauda equina appear normal. Paraspinal and other soft tissues: There appears be thrombosis of the IVC and both iliac veins up to the level of the IVC filter. Flow is restored in the IVC by inflow from the renal veins. Disc levels: No abnormality from L1-2 through L2-3. L3-4: No disc abnormality.  Mild facet hypertrophy.  No stenosis. L4-5: No disc abnormality. Mild to moderate facet hypertrophy. No stenosis. L5-S1: Minimal disc bulge.  Mild facet hypertrophy.  No stenosis. IMPRESSION: No significant disc pathology. No stenosis or neural compression in the lumbar region. Patient has exaggerated lumbar lordosis. 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 Flow is restored in the IVC above that by inflow from the renal veins. I am in the process of calling this report. Electronically Signed   By: Nelson Chimes M.D.   On: 03/22/2020 12:46   CT ABDOMEN PELVIS W CONTRAST  Result Date: 03/22/2020 CLINICAL DATA:  Severe back pain hypotension and shallow EXAM: CT ANGIOGRAPHY  CHEST WITH CONTRAST TECHNIQUE: Multidetector CT imaging of the chest was performed using the standard protocol during bolus administration of intravenous contrast. Multiplanar CT image reconstructions and MIPs were obtained to evaluate the vascular anatomy. CONTRAST:  38mL OMNIPAQUE IOHEXOL 350 MG/ML SOLN COMPARISON:  None. FINDINGS: Cardiovascular: There is a optimal opacification of the pulmonary arteries. There is no central,segmental, or subsegmental filling defects within the pulmonary arteries. The heart is normal in size. No pericardial  effusion or thickening. No evidence right heart strain. There is normal three-vessel brachiocephalic anatomy without proximal stenosis. The thoracic aorta is normal in appearance. Mediastinum/Nodes: No hilar, mediastinal, or axillary adenopathy. Thyroid gland, trachea, and esophagus demonstrate no significant findings. Lungs/Pleura: Multifocal patchy ground-glass opacities are seen predominantly within the periphery of both lungs. No pleural effusion or pneumothorax is seen. Upper Abdomen: No acute abnormalities present in the visualized portions of the upper abdomen. Musculoskeletal: No chest wall abnormality. No acute or significant osseous findings. Review of the MIP images confirms the above findings. Abdomen/pelvis: Hepatobiliary: There is diffuse low density seen throughout the liver parenchyma. The main portal vein is patent. No evidence of calcified gallstones, gallbladder wall thickening or biliary dilatation. Pancreas: Unremarkable. No pancreatic ductal dilatation or surrounding inflammatory changes. Spleen: Normal in size without focal abnormality. Adrenals/Urinary Tract: Both adrenal glands appear normal. The kidneys and collecting system appear normal without evidence of urinary tract calculus or hydronephrosis. Bladder is unremarkable. Stomach/Bowel: The stomach, small bowel, and colon are normal in appearance. No inflammatory changes, wall thickening, or obstructive findings. There appears to be a surgical anastomosis at the sigmoid rectal junction. Mild presacral soft tissue density is again identified. Vascular/Lymphatic: There are no enlarged mesenteric, retroperitoneal, or pelvic lymph nodes. An infrarenal IVC filter is seen at approximately the L3 level. There is scattered aortic and bi-iliac calcifications noted. Reproductive: The uterus and adnexa are unremarkable. Other: No evidence of abdominal wall mass or hernia. Musculoskeletal: No acute or significant osseous findings. IMPRESSION: No  central, segmental, or subsegmental pulmonary embolism. Multifocal patchy airspace opacities throughout both lungs, likely consistent with multifocal pneumonia. Hepatic steatosis Aortic Atherosclerosis (ICD10-I70.0). Electronically Signed   By: Prudencio Pair M.D.   On: 03/22/2020 00:07   DG Chest Port 1 View  Result Date: 03/21/2020 CLINICAL DATA:  54 year old female with weakness and hypotension. EXAM: PORTABLE CHEST 1 VIEW COMPARISON:  Chest radiograph dated 03/25/2016 FINDINGS: Faint bilateral peripheral and subpleural densities, right greater left concerning for pneumonia, possibly atypical or viral in etiology including COVID-19. Clinical correlation is recommended. No focal consolidation, pleural effusion, pneumothorax. The cardiac silhouette is within limits. No acute osseous pathology. IMPRESSION: Findings concerning for pneumonia. Electronically Signed   By: Anner Crete M.D.   On: 03/21/2020 22:17   VAS [place] LOWER EXTREMITY VENOUS (DVT)  Result Date: 03/22/2020  Lower Venous DVTStudy Indications: Hx DVT and IVC filter, now with COVID and elevated D dimer.  Comparison Study: 06/07/15 negative Performing Technologist: June Leap RDMS, RVT  Examination Guidelines: A complete evaluation includes B-mode imaging, spectral Doppler, color Doppler, and power Doppler as needed of all accessible portions of each vessel. Bilateral testing is considered an integral part of a complete examination. Limited examinations for reoccurring indications may be performed as noted. The reflux portion of the exam is performed with the patient in reverse Trendelenburg.  +---------+---------------+---------+-----------+----------+-----------------+ RIGHT    CompressibilityPhasicitySpontaneityPropertiesThrombus Aging    +---------+---------------+---------+-----------+----------+-----------------+ CFV      None           No  No                   Acute              +---------+---------------+---------+-----------+----------+-----------------+ SFJ      None                                         Acute             +---------+---------------+---------+-----------+----------+-----------------+ FV Prox  None                                         Acute             +---------+---------------+---------+-----------+----------+-----------------+ FV Mid   None                                         Acute             +---------+---------------+---------+-----------+----------+-----------------+ FV DistalNone                                         Acute             +---------+---------------+---------+-----------+----------+-----------------+ PFV      None                                         Acute             +---------+---------------+---------+-----------+----------+-----------------+ POP      None           No       No                   Acute             +---------+---------------+---------+-----------+----------+-----------------+ PTV      None                                         Acute             +---------+---------------+---------+-----------+----------+-----------------+ PERO     None                                         Acute             +---------+---------------+---------+-----------+----------+-----------------+ Gastroc  None                                         Acute             +---------+---------------+---------+-----------+----------+-----------------+ GSV      None  Acute             +---------+---------------+---------+-----------+----------+-----------------+ Ex Iliac                No       No                   Age Indeterminate +---------+---------------+---------+-----------+----------+-----------------+ Com Iliac                                             Not visualized     +---------+---------------+---------+-----------+----------+-----------------+   +---------+---------------+---------+-----------+----------+-----------------+ LEFT     CompressibilityPhasicitySpontaneityPropertiesThrombus Aging    +---------+---------------+---------+-----------+----------+-----------------+ CFV      None           No       No                   Acute             +---------+---------------+---------+-----------+----------+-----------------+ SFJ      None                                         Acute             +---------+---------------+---------+-----------+----------+-----------------+ FV Prox  None                                         Acute             +---------+---------------+---------+-----------+----------+-----------------+ FV Mid   None                                         Acute             +---------+---------------+---------+-----------+----------+-----------------+ FV DistalNone                                         Acute             +---------+---------------+---------+-----------+----------+-----------------+ PFV      None                                         Acute             +---------+---------------+---------+-----------+----------+-----------------+ POP      None           No       No                   Acute             +---------+---------------+---------+-----------+----------+-----------------+ PTV      None                                         Acute             +---------+---------------+---------+-----------+----------+-----------------+ PERO  None                                         Acute             +---------+---------------+---------+-----------+----------+-----------------+ Gastroc  None                                         Acute             +---------+---------------+---------+-----------+----------+-----------------+ GSV      None                                          Acute             +---------+---------------+---------+-----------+----------+-----------------+ Ex Iliac                No       No                   Age Indeterminate +---------+---------------+---------+-----------+----------+-----------------+ Com Iliac                                             Not visualized    +---------+---------------+---------+-----------+----------+-----------------+     Summary: RIGHT: - Findings consistent with acute deep vein thrombosis involving the right common femoral vein, SF junction, right femoral vein, right proximal profunda vein, right popliteal vein, right posterior tibial veins, right peroneal veins, and right gastrocnemius veins. - Findings consistent with acute superficial vein thrombosis involving the right great saphenous vein. - Thrombosis extends into the Right Iliac vein and IVC up to the level of the filter placement.  LEFT: - Findings consistent with acute deep vein thrombosis involving the left common femoral vein, SF junction, left femoral vein, left proximal profunda vein, left popliteal vein, left posterior tibial veins, left peroneal veins, and left gastrocnemius veins. - Findings consistent with acute superficial vein thrombosis involving the left great saphenous vein. - Thrombosis extends into the Left Iliac vein.  *See table(s) above for measurements and observations. Electronically signed by Curt Jews MD on 03/22/2020 at 5:19:17 PM.    Final      Phillips Climes M.D on 03/24/2020 at 12:06 PM    [facility] -  Office  [phone]

## 2020-03-24 NOTE — Plan of Care (Signed)
  Problem: Clinical Measurements: Goal: Respiratory complications will improve Outcome: Progressing   

## 2020-03-24 NOTE — Progress Notes (Signed)
   Discussed plan lytic catheter placement on Tuesday of next week and angio VAC proceeding Wednesday of next week.  She should be able to be ambulatory after the second stage of this procedure.  She will need anticoagulation afterwards for at least 6 months.  We will plan to take the filter out at the time of second procedure.  All questions were answered I have encouraged her to discuss with her family and have more questions and I will discuss again with her Monday prior to proceeding Tuesday.  Jayveon Convey C. Donzetta Matters, MD [facility] Office: [phone] Pager: [phone]

## 2020-03-24 NOTE — Progress Notes (Signed)
ANTICOAGULATION CONSULT NOTE - Follow Up Consult  Pharmacy Consult for Heparin Indication:  DVTx2, D-dimer > 20, Covid  Allergies  Allergen Reactions  . Metformin And Related Other (See Comments)    Chest pain  . Penicillins Hives    Patient Measurements: Height: 5\' 11"  (180.3 cm) Weight: 94.2 kg (207 lb 10.8 oz) IBW/kg (Calculated) : 70.8   Vital Signs: Temp: 97.8 F (36.6 C) (06/04 1145) Temp Source: Oral (06/04 1145) BP: 116/77 (06/04 1145) Pulse Rate: 83 (06/04 1145)  Labs: Recent Labs    03/21/20 2143 03/21/20 2143 03/21/20 2235 03/21/20 2336 03/22/20 0241 03/22/20 0241 03/22/20 2219 03/22/20 2219 03/23/20 0548 03/23/20 1224 03/24/20 0834 03/24/20 1357  HGB 13.3   < >   < >  --  12.6   < >  --   --  12.1  --  11.8*  --   HCT 41.8   < >   < >  --  40.6  --   --   --  38.4  --  36.4  --   PLT 270   < >  --   --  208  --   --   --  141*  --  164  --   APTT 52*  --   --   --   --   --   --   --   --   --   --   --   LABPROT 17.3*  --   --   --   --   --   --   --   --   --   --   --   INR 1.5*  --   --   --   --   --   --   --   --   --   --   --   HEPARINUNFRC  --   --   --   --   --   --  1.00*   < > 0.69  --  SEE [id number] 0.48  CREATININE 1.43*   < >   < >  --  1.24*   < > 1.06*  --   --  0.85 0.79  --   CKTOTAL  --   --   --  109  --   --   --   --   --   --   --   --   TROPONINIHS <2  --   --   --   --   --   --   --   --   --   --   --    < > = values in this interval not displayed.    Estimated Creatinine Clearance: 103 mL/min (by C-G formula based on SCr of 0.79 mg/dL).  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

## 2020-03-25 LAB — GLUCOSE, CAPILLARY
Glucose-Capillary: 268 mg/dL — ABNORMAL HIGH (ref 70–99)
Glucose-Capillary: 223 mg/dL — ABNORMAL HIGH (ref 70–99)
Glucose-Capillary: 209 mg/dL — ABNORMAL HIGH (ref 70–99)
Glucose-Capillary: 249 mg/dL — ABNORMAL HIGH (ref 70–99)

## 2020-03-25 LAB — CBC WITH DIFFERENTIAL/PLATELET
Abs Immature Granulocytes: 0.08 10*3/uL — ABNORMAL HIGH (ref 0.00–0.07)
Basophils Absolute: 0 10*3/uL (ref 0.0–0.1)
Basophils Relative: 0 %
Eosinophils Absolute: 0 10*3/uL (ref 0.0–0.5)
Eosinophils Relative: 0 %
HCT: 35.5 % — ABNORMAL LOW (ref 36.0–46.0)
Hemoglobin: 11.3 g/dL — ABNORMAL LOW (ref 12.0–15.0)
Immature Granulocytes: 1 %
Lymphocytes Relative: 10 %
Lymphs Abs: 0.8 10*3/uL (ref 0.7–4.0)
MCH: 29.4 pg (ref 26.0–34.0)
MCHC: 31.8 g/dL (ref 30.0–36.0)
MCV: 92.2 fL (ref 80.0–100.0)
Monocytes Absolute: 0.2 10*3/uL (ref 0.1–1.0)
Monocytes Relative: 3 %
Neutro Abs: 6.7 10*3/uL (ref 1.7–7.7)
Neutrophils Relative %: 86 %
Platelets: 161 10*3/uL (ref 150–400)
RBC: 3.85 MIL/uL — ABNORMAL LOW (ref 3.87–5.11)
RDW: 13.9 % (ref 11.5–15.5)
WBC: 7.8 10*3/uL (ref 4.0–10.5)
nRBC: 0 % (ref 0.0–0.2)

## 2020-03-25 LAB — COMPREHENSIVE METABOLIC PANEL
ALT: 25 U/L (ref 0–44)
AST: 21 U/L (ref 15–41)
Albumin: 2.8 g/dL — ABNORMAL LOW (ref 3.5–5.0)
Alkaline Phosphatase: 75 U/L (ref 38–126)
Anion gap: 8 (ref 5–15)
BUN: 25 mg/dL — ABNORMAL HIGH (ref 6–20)
CO2: 25 mmol/L (ref 22–32)
Calcium: 8.7 mg/dL — ABNORMAL LOW (ref 8.9–10.3)
Chloride: 102 mmol/L (ref 98–111)
Creatinine, Ser: 0.79 mg/dL (ref 0.44–1.00)
GFR calc Af Amer: >60 mL/min (ref >60)
GFR calc non Af Amer: >60 mL/min (ref >60)
Glucose, Bld: 276 mg/dL — ABNORMAL HIGH (ref 70–99)
Potassium: 4.4 mmol/L (ref 3.5–5.1)
Sodium: 135 mmol/L (ref 135–145)
Total Bilirubin: 0.7 mg/dL (ref 0.3–1.2)
Total Protein: 6.4 g/dL — ABNORMAL LOW (ref 6.5–8.1)

## 2020-03-25 LAB — HEPARIN LEVEL (UNFRACTIONATED): Heparin Unfractionated: 0.48 IU/mL (ref 0.30–0.70)

## 2020-03-25 LAB — C-REACTIVE PROTEIN: CRP: 3.2 mg/dL — ABNORMAL HIGH (ref <1.0)

## 2020-03-25 LAB — D-DIMER, QUANTITATIVE: D-Dimer, Quant: 16.87 ug/mL-FEU — ABNORMAL HIGH (ref 0.00–0.50)

## 2020-03-25 MED ORDER — INSULIN GLARGINE 100 UNIT/ML ~~LOC~~ SOLN
16.0000 [IU] | Freq: Every day | SUBCUTANEOUS | Status: DC
  Administered 2020-03-25 – 2020-03-28 (×4): 16 [IU] via SUBCUTANEOUS
  Filled 2020-03-25 (×5): qty 0.16

## 2020-03-25 MED ORDER — DEXAMETHASONE 4 MG PO TABS
4.0000 mg | ORAL_TABLET | Freq: Every day | ORAL | Status: DC
  Administered 2020-03-26 – 2020-03-27 (×2): 4 mg via ORAL
  Filled 2020-03-25 (×4): qty 1

## 2020-03-25 MED ORDER — HYDROMORPHONE HCL 1 MG/ML IJ SOLN
0.5000 mg | INTRAMUSCULAR | Status: DC | PRN
  Administered 2020-03-25 – 2020-04-04 (×14): 0.5 mg via INTRAVENOUS
  Filled 2020-03-25 (×9): qty 0.5
  Filled 2020-03-25: qty 1
  Filled 2020-03-25: qty 0.5
  Filled 2020-03-25: qty 1
  Filled 2020-03-25: qty 0.5
  Filled 2020-03-25: qty 1

## 2020-03-25 MED ORDER — INSULIN ASPART 100 UNIT/ML ~~LOC~~ SOLN
4.0000 [IU] | Freq: Three times a day (TID) | SUBCUTANEOUS | Status: DC
  Administered 2020-03-25 – 2020-03-27 (×9): 4 [IU] via SUBCUTANEOUS

## 2020-03-25 NOTE — Plan of Care (Signed)
  Problem: Nutrition: Goal: Adequate nutrition will be maintained Outcome: Progressing   

## 2020-03-25 NOTE — Progress Notes (Signed)
PROGRESS NOTE                                                                                                                                                                                                             Patient Demographics:    [name], is a 54 y.o. female, DOB - 1966-07-04, UUV:[id number]  Admit date - 03/21/2020   Admitting Physician Lenore Cordia, MD  Outpatient Primary MD for the patient is Glendale Chard, MD  LOS - 3   Chief Complaint  Patient presents with  . Hypotension  . Back Pain       Brief Narrative    [name] is a 54 y.o. female with medical history significant for type 2 diabetes, hypothyroidism, hyperlipidemia, history of PE, and history of colon cancer who presents to the ED for evaluation of new onset low back pain with lower extremity weakness and change in sensation, she was recently diagnosed with COVID-19 on 03/10/2020.  She did not require hospitalization or specific treatment at that time however has been having progressive fatigue and malaise since then.  She was given antibiotics for possible UTI earlier today.    Is presented to ED 6/1 secondary to complaints of lower back pain and weakness in both of her legs.  CTA chest PE study and CT abdomen/pelvis with contrast is negative for PE.  Focal patchy airspace opacities are seen throughout both lungs.  Hepatic steatosis is noted. -MRI thoracic/lumbar spine were significant for T7 disc herniation, does not with any focal deficits, but it did show evidence of IVC filter clotting, had venous Doppler showing extensive DVT in the bilateral lower extremities for which vascular surgeon has been consulted.     Subjective:    [name] today has any dyspnea,She denies cough or duspnea today.  Patient reports diarrhea (chronic stool incontinence from colon cancer surgery) has improved with Imodium.   Assessment  & Plan :    Principal Problem:   Sepsis  due to COVID-19 [facility]) Active Problems:   Hyperlipidemia   Hypothyroidism   Type 2 diabetes mellitus without complication, without long-term current use of insulin ([place])    Sepsis due to COVID-19 pneumonitis - SARS-CoV-2 PCR positive on 03/11/2019.  Has been having progressive fatigue, weakness.  CT imaging shows multifocal infiltrates. -Patient with no hypoxia, but there  is imaging significant for multifocal opacity. -Continue with  Steroids, change IV Decadron to oral today -Continue with IV remdesivir. -Encouraged to use incentive spirometer, she is currently now at bedrest. -We will go ahead and discontinue her IV Rocephin and azithromycin today.  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 -Vascular surgery consult greatly appreciated, for now continue with full anticoagulation, patient will need mechanical thrombectomy with possible lysis, to be done next Tuesday/Wednesday at 2 days procedures, and where she will require ICU stay then .  Acute kidney injury: Likely secondary to sepsis.  ,  Resolved.  Thoracic radiculopathy -Patient initially presented with lower extremity weakness, this is most likely related to limited range of motion due to pain from her, discussed MRI findings with Dr. Arnoldo Morale, who reviewed her MRI thoracic/lumbar spine, there is no emergency, and patient can follow with neurosurgery as an outpatient if it does cause any symptoms.  Hyperglycemia and type 2 diabetes with mild DKA : -She is initially on insulin drip, this has been stopped, he has resolved. -CBG uncontrolled, will go ahead and increase her Lantus to 16 units, and add 4 units before meals, continue with sliding scale. -Started on Jardiance. -Beta hydroxybutyric acid has normalized. -Hemoglobin A1c is 8.6 -Continue with bicarb drip, as her bicarb has normalized  Hyperkalemia: K5.5 in setting of AKI.   Give Lokelma 10 mg once.    Resolved with hydration  Hypothyroidism: Continue Synthroid.    Hyperlipidemia: Continue simvastatin.    COVID-19 Labs  Recent Labs    03/23/20 0548 03/24/20 0834 03/25/20 0506  DDIMER >20.00* 17.19* 16.87*  FERRITIN 252  --   --   CRP  --  6.1* 3.2*    Lab Results  Component Value Date   SARSCOV2NAA POSITIVE (A) 03/10/2020     Code Status : Full   Family Communication  :  D/W husband via phone 6/3.  Disposition Plan  :  Status is: Inpatient  Remains inpatient appropriate because:IV treatments appropriate due to intensity of illness or inability to take PO   Dispo: The patient is from: Home              Anticipated d/c is to: Home              Anticipated d/c date is: 3 days              Patient currently is not medically stable to d/c.      Consults  :  Vascular Surgery  Procedures  : None  DVT Prophylaxis  :  Heparin GTT  Lab Results  Component Value Date   PLT 161 03/25/2020    Antibiotics  :    Anti-infectives (From admission, onward)   Start     Dose/Rate Route Frequency Ordered Stop   03/23/20 2200  azithromycin (ZITHROMAX) tablet 500 mg     500 mg Oral Daily at bedtime 03/23/20 0806     03/23/20 1000  remdesivir 100 mg in sodium chloride 0.9 % 100 mL IVPB     100 mg 200 mL/hr over 30 Minutes Intravenous Daily 03/22/20 0110 03/27/20 0959   03/22/20 2200  azithromycin (ZITHROMAX) 500 mg in sodium chloride 0.9 % 250 mL IVPB  Status:  Discontinued     500 mg 250 mL/hr over 60 Minutes Intravenous Every 24 hours 03/22/20 0111 03/23/20 0806   03/22/20 2200  cefTRIAXone (ROCEPHIN) 1 g in sodium chloride 0.9 %  100 mL IVPB     1 g 200 mL/hr over 30 Minutes Intravenous Every 24 hours 03/22/20 0111     03/22/20 0200  remdesivir 200 mg in sodium chloride 0.9% 250 mL IVPB     200 mg 580 mL/hr over 30 Minutes Intravenous Once 03/22/20 0110 03/22/20 1900   03/22/20 0015  cefTRIAXone (ROCEPHIN) 1 g in sodium chloride 0.9  % 100 mL IVPB     1 g 200 mL/hr over 30 Minutes Intravenous  Once 03/22/20 0010 03/22/20 0103   03/21/20 2300  cefTRIAXone (ROCEPHIN) injection 1 g  Status:  Discontinued     1 g Intramuscular  Once 03/21/20 2253 03/22/20 0010   03/21/20 2300  azithromycin (ZITHROMAX) 500 mg in sodium chloride 0.9 % 250 mL IVPB     500 mg 250 mL/hr over 60 Minutes Intravenous  Once 03/21/20 2253 03/22/20 0129        Objective:   Vitals:   03/24/20 2000 03/25/20 0003 03/25/20 0347 03/25/20 0800  BP: 115/64 101/65 106/64 105/62  Pulse: 94 66 69 69  Resp: 17 18 15 13   Temp: 97.7 F (36.5 C) 97.9 F (36.6 C) 97.9 F (36.6 C) 98 F (36.7 C)  TempSrc: Oral Oral Oral Oral  SpO2: 97% 97% 95% 96%  Weight:      Height:        Wt Readings from Last 3 Encounters:  03/22/20 94.2 kg  02/15/20 91.2 kg  08/02/19 95.3 kg     Intake/Output Summary (Last 24 hours) at 03/25/2020 1230 Last data filed at 03/25/2020 1012 Gross per 24 hour  Intake 302.94 ml  Output 400 ml  Net -97.06 ml     Physical Exam  Awake Alert, Oriented X 3, No new F.N deficits, Normal affect Symmetrical Chest wall movement, Good air movement bilaterally, CTAB RRR,No Gallops,Rubs or new Murmurs, No Parasternal Heave +ve B.Sounds, Abd Soft, No tenderness, No rebound - guarding or rigidity. No Cyanosis, Clubbing , patient has extensive edema up to upper thighs.      Data Review:    CBC Recent Labs  Lab 03/21/20 2143 03/21/20 2143 03/21/20 2235 03/22/20 0241 03/23/20 0548 03/24/20 0834 03/25/20 0506  WBC 15.2*  --   --  15.1* 14.6* 9.5 7.8  HGB 13.3   < > 13.6 12.6 12.1 11.8* 11.3*  HCT 41.8   < > 40.0 40.6 38.4 36.4 35.5*  PLT 270  --   --  208 141* 164 161  MCV 93.3  --   --  95.1 92.8 91.9 92.2  MCH 29.7  --   --  29.5 29.2 29.8 29.4  MCHC 31.8  --   --  31.0 31.5 32.4 31.8  RDW 13.2  --   --  13.5 13.8 13.9 13.9  LYMPHSABS 1.3  --   --  0.8 0.8 0.6* 0.8  MONOABS 0.6  --   --  0.3 0.2 0.1 0.2  EOSABS 0.0  --    --  0.0 0.0 0.0 0.0  BASOSABS 0.0  --   --  0.0 0.0 0.0 0.0   < > = values in this interval not displayed.    Chemistries  Recent Labs  Lab 03/21/20 2143 03/21/20 2235 03/22/20 0241 03/22/20 2219 03/23/20 1224 03/24/20 0834 03/25/20 0506  NA 136   < > 139 137 137 134* 135  K 5.9*   < > 4.0 4.7 4.1 4.0 4.4  CL 105   < > 112* 106 103 101  102  CO2 15*   < > 11* 20* 20* 23 25  GLUCOSE 368*   < > 288* 206* 224* 329* 276*  BUN 31*   < > 26* 26* 22* 24* 25*  CREATININE 1.43*   < > 1.24* 1.06* 0.85 0.79 0.79  CALCIUM 8.7*   < > 8.0* 8.2* 8.3* 8.6* 8.7*  MG  --   --  1.5*  --  2.0  --   --   AST 31  --  17  --  23 22 21   ALT 23  --  19  --  19 21 25   ALKPHOS 74  --  77  --  74 84 75  BILITOT 1.8*  --  0.8  --  0.9 0.7 0.7   < > = values in this interval not displayed.   ------------------------------------------------------------------------------------------------------------------ No results for input(s): CHOL, HDL, LDLCALC, TRIG, CHOLHDL, LDLDIRECT in the last 72 hours.  Lab Results  Component Value Date   HGBA1C 8.6 (H) 03/23/2020   ------------------------------------------------------------------------------------------------------------------ No results for input(s): TSH, T4TOTAL, T3FREE, THYROIDAB in the last 72 hours.  Invalid input(s): FREET3 ------------------------------------------------------------------------------------------------------------------ Recent Labs    03/23/20 0548  FERRITIN 252    Coagulation profile Recent Labs  Lab 03/21/20 2143  INR 1.5*    Recent Labs    03/24/20 0834 03/25/20 0506  DDIMER 17.19* 16.87*    Cardiac Enzymes No results for input(s): CKMB, TROPONINI, MYOGLOBIN in the last 168 hours.  Invalid input(s): CK ------------------------------------------------------------------------------------------------------------------    Component Value Date/Time   BNP 29.9 03/21/2020 2148    Inpatient  Medications  Scheduled Meds: . vitamin C  500 mg Oral Daily  . azithromycin  500 mg Oral QHS  . Chlorhexidine Gluconate Cloth  6 each Topical Daily  . dexamethasone (DECADRON) injection  6 mg Intravenous Q24H  . famotidine  20 mg Oral BID  . feeding supplement (GLUCERNA SHAKE)  237 mL Oral TID BM  . insulin aspart  0-15 Units Subcutaneous TID WC  . insulin aspart  0-5 Units Subcutaneous QHS  . insulin aspart  4 Units Subcutaneous TID WC  . insulin glargine  16 Units Subcutaneous Daily  . Ipratropium-Albuterol  1 puff Inhalation Q6H  . levothyroxine  100 mcg Oral Q0600  . linagliptin  5 mg Oral Daily  . simvastatin  10 mg Oral Daily  . sodium chloride flush  3 mL Intravenous Q12H  . zinc sulfate  220 mg Oral Daily   Continuous Infusions: . cefTRIAXone (ROCEPHIN)  IV Stopped (03/25/20 0810)  . heparin 1,000 Units/hr (03/24/20 1536)  . remdesivir 100 mg in NS 100 mL 100 mg (03/25/20 1005)   PRN Meds:.acetaminophen, chlorpheniramine-HYDROcodone, dextrose, guaiFENesin-dextromethorphan, HYDROmorphone (DILAUDID) injection, loperamide, ondansetron **OR** ondansetron (ZOFRAN) IV, oxyCODONE, senna-docusate, zolpidem  Micro Results Recent Results (from the past 240 hour(s))  Blood Culture (routine x 2)     Status: None (Preliminary result)   Collection Time: 03/21/20 10:00 PM   Specimen: BLOOD  Result Value Ref Range Status   Specimen Description BLOOD LEFT ANTECUBITAL  Final   Special Requests   Final    BOTTLES DRAWN AEROBIC AND ANAEROBIC Blood Culture results may not be optimal due to an inadequate volume of blood received in culture bottles   Culture   Final    NO GROWTH 4 DAYS Performed at [facility], [place] [address] [place], [place] [address]    Report Status PENDING  Incomplete  Blood Culture (routine x 2)  Status: None (Preliminary result)   Collection Time: 03/21/20 11:41 PM   Specimen: BLOOD LEFT HAND  Result Value Ref Range Status   Specimen Description BLOOD  LEFT HAND  Final   Special Requests   Final    BOTTLES DRAWN AEROBIC AND ANAEROBIC Blood Culture adequate volume   Culture   Final    NO GROWTH 3 DAYS Performed at [facility], [address] [place], [place] [address]    Report Status PENDING  Incomplete  Urine culture     Status: None   Collection Time: 03/22/20  1:48 AM   Specimen: In/Out Cath Urine  Result Value Ref Range Status   Specimen Description IN/OUT CATH URINE  Final   Special Requests NONE  Final   Culture   Final    NO GROWTH Performed at [facility], [place] [address], [place], [place] [address]    Report Status 03/22/2020 FINAL  Final  MRSA PCR Screening     Status: None   Collection Time: 03/22/20 12:32 PM   Specimen: Nasal Mucosa; Nasopharyngeal  Result Value Ref Range Status   MRSA by PCR NEGATIVE NEGATIVE Final    Comment:        The GeneXpert MRSA Assay (FDA approved for NASAL specimens only), is one component of a comprehensive MRSA colonization surveillance program. It is not intended to diagnose MRSA infection nor to guide or monitor treatment for MRSA infections. Performed at [facility], [place] [address] [place], [place] [address]     Radiology Reports CT Angio Chest PE W and/or Wo Contrast  Result Date: 03/22/2020 CLINICAL DATA:  Severe back pain hypotension and shallow EXAM: CT ANGIOGRAPHY CHEST WITH CONTRAST TECHNIQUE: Multidetector CT imaging of the chest was performed using the standard protocol during bolus administration of intravenous contrast. Multiplanar CT image reconstructions and MIPs were obtained to evaluate the vascular anatomy. CONTRAST:  82mL OMNIPAQUE IOHEXOL 350 MG/ML SOLN COMPARISON:  None. FINDINGS: Cardiovascular: There is a optimal opacification of the pulmonary arteries. There is no central,segmental, or subsegmental filling defects within the pulmonary arteries. The heart is normal in size. No pericardial effusion or thickening. No evidence right heart  strain. There is normal three-vessel brachiocephalic anatomy without proximal stenosis. The thoracic aorta is normal in appearance. Mediastinum/Nodes: No hilar, mediastinal, or axillary adenopathy. Thyroid gland, trachea, and esophagus demonstrate no significant findings. Lungs/Pleura: Multifocal patchy ground-glass opacities are seen predominantly within the periphery of both lungs. No pleural effusion or pneumothorax is seen. Upper Abdomen: No acute abnormalities present in the visualized portions of the upper abdomen. Musculoskeletal: No chest wall abnormality. No acute or significant osseous findings. Review of the MIP images confirms the above findings. Abdomen/pelvis: Hepatobiliary: There is diffuse low density seen throughout the liver parenchyma. The main portal vein is patent. No evidence of calcified gallstones, gallbladder wall thickening or biliary dilatation. Pancreas: Unremarkable. No pancreatic ductal dilatation or surrounding inflammatory changes. Spleen: Normal in size without focal abnormality. Adrenals/Urinary Tract: Both adrenal glands appear normal. The kidneys and collecting system appear normal without evidence of urinary tract calculus or hydronephrosis. Bladder is unremarkable. Stomach/Bowel: The stomach, small bowel, and colon are normal in appearance. No inflammatory changes, wall thickening, or obstructive findings. There appears to be a surgical anastomosis at the sigmoid rectal junction. Mild presacral soft tissue density is again identified. Vascular/Lymphatic: There are no enlarged mesenteric, retroperitoneal, or pelvic lymph nodes. An infrarenal IVC filter is seen at approximately the L3 level. There is scattered  aortic and bi-iliac calcifications noted. Reproductive: The uterus and adnexa are unremarkable. Other: No evidence of abdominal wall mass or hernia. Musculoskeletal: No acute or significant osseous findings. IMPRESSION: No central, segmental, or subsegmental pulmonary  embolism. Multifocal patchy airspace opacities throughout both lungs, likely consistent with multifocal pneumonia. Hepatic steatosis Aortic Atherosclerosis (ICD10-I70.0). Electronically Signed   By: Prudencio Pair M.D.   On: 03/22/2020 00:07   MR THORACIC SPINE W WO CONTRAST  Result Date: 03/22/2020 CLINICAL DATA:  Bilateral lower extremity pain and numbness with urinary retention. Diabetes. History of colon cancer. EXAM: MRI THORACIC WITHOUT AND WITH CONTRAST TECHNIQUE: Multiplanar and multiecho pulse sequences of the thoracic spine were obtained without and with intravenous contrast. CONTRAST:  9.39mL GADAVIST GADOBUTROL 1 MMOL/ML IV SOLN COMPARISON:  CT chest done yesterday. FINDINGS: MRI THORACIC SPINE FINDINGS Alignment:  Normal Vertebrae: Normal. No fracture or primary bone lesion. No metastatic disease. Insignificant small hemangioma within the T11 vertebral body. Cord: No primary cord pathology. See below regarding stenosis at T7-8. Paraspinal and other soft tissues: Negative Disc levels: No abnormality at T5-6 or above. T6-7: Small right paracentral disc protrusion indents the thecal sac slightly but does not cause apparent neural compression. T7-8: Left posterolateral disc herniation effaces the ventral subarachnoid space and indents the left side of the cord. This could be symptomatic. T8-9 through T12-L1: Normal. 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 Ample subarachnoid space is present dorsal to the cord at this level. None the less, this could possibly be symptomatic. Small right paracentral disc herniation at T6-7 without apparent neural compression. Electronically Signed   By: Nelson Chimes M.D.   On: 03/22/2020 12:32   MR Lumbar Spine W Wo Contrast  Result Date: 03/22/2020 CLINICAL DATA:  Back pain.  Urinary retention.  Diabetes. EXAM: MRI LUMBAR SPINE WITHOUT AND WITH CONTRAST  TECHNIQUE: Multiplanar and multiecho pulse sequences of the lumbar spine were obtained without and with intravenous contrast. CONTRAST:  9.29mL GADAVIST GADOBUTROL 1 MMOL/ML IV SOLN COMPARISON:  None. FINDINGS: Segmentation:  5 lumbar type vertebral bodies. Alignment:  Slightly exaggerated lower lumbar lordosis. Vertebrae:  No fracture or primary bone lesion. Conus medullaris and cauda equina: Conus extends to the L1 level. Conus and cauda equina appear normal. Paraspinal and other soft tissues: There appears be thrombosis of the IVC and both iliac veins up to the level of the IVC filter. Flow is restored in the IVC by inflow from the renal veins. Disc levels: No abnormality from L1-2 through L2-3. L3-4: No disc abnormality.  Mild facet hypertrophy.  No stenosis. L4-5: No disc abnormality. Mild to moderate facet hypertrophy. No stenosis. L5-S1: Minimal disc bulge.  Mild facet hypertrophy.  No stenosis. IMPRESSION: No significant disc pathology. No stenosis or neural compression in the lumbar region. Patient has exaggerated lumbar lordosis. 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 Flow is restored in the IVC above that by inflow from the renal veins. I am in the process of calling this report. Electronically Signed   By: Nelson Chimes M.D.   On: 03/22/2020 12:46   CT ABDOMEN PELVIS W CONTRAST  Result Date: 03/22/2020 CLINICAL DATA:  Severe back pain hypotension and shallow EXAM: CT ANGIOGRAPHY  CHEST WITH CONTRAST TECHNIQUE: Multidetector CT imaging of the chest was performed using the standard protocol during bolus administration of intravenous contrast. Multiplanar CT image reconstructions and MIPs were obtained to evaluate the vascular anatomy. CONTRAST:  22mL OMNIPAQUE IOHEXOL 350 MG/ML SOLN COMPARISON:  None. FINDINGS:  Cardiovascular: There is a optimal opacification of the pulmonary arteries. There is no central,segmental, or subsegmental filling defects within the pulmonary arteries. The heart is normal in size. No pericardial effusion or thickening. No evidence right heart strain. There is normal three-vessel brachiocephalic anatomy without proximal stenosis. The thoracic aorta is normal in appearance. Mediastinum/Nodes: No hilar, mediastinal, or axillary adenopathy. Thyroid gland, trachea, and esophagus demonstrate no significant findings. Lungs/Pleura: Multifocal patchy ground-glass opacities are seen predominantly within the periphery of both lungs. No pleural effusion or pneumothorax is seen. Upper Abdomen: No acute abnormalities present in the visualized portions of the upper abdomen. Musculoskeletal: No chest wall abnormality. No acute or significant osseous findings. Review of the MIP images confirms the above findings. Abdomen/pelvis: Hepatobiliary: There is diffuse low density seen throughout the liver parenchyma. The main portal vein is patent. No evidence of calcified gallstones, gallbladder wall thickening or biliary dilatation. Pancreas: Unremarkable. No pancreatic ductal dilatation or surrounding inflammatory changes. Spleen: Normal in size without focal abnormality. Adrenals/Urinary Tract: Both adrenal glands appear normal. The kidneys and collecting system appear normal without evidence of urinary tract calculus or hydronephrosis. Bladder is unremarkable. Stomach/Bowel: The stomach, small bowel, and colon are normal in appearance. No inflammatory changes, wall thickening, or obstructive findings. There appears to be a surgical anastomosis at the sigmoid rectal junction. Mild presacral soft tissue density is again identified. Vascular/Lymphatic: There are no enlarged mesenteric, retroperitoneal, or pelvic lymph nodes. An infrarenal IVC filter is seen at approximately the L3 level. There is scattered aortic and  bi-iliac calcifications noted. Reproductive: The uterus and adnexa are unremarkable. Other: No evidence of abdominal wall mass or hernia. Musculoskeletal: No acute or significant osseous findings. IMPRESSION: No central, segmental, or subsegmental pulmonary embolism. Multifocal patchy airspace opacities throughout both lungs, likely consistent with multifocal pneumonia. Hepatic steatosis Aortic Atherosclerosis (ICD10-I70.0). Electronically Signed   By: Prudencio Pair M.D.   On: 03/22/2020 00:07   DG Chest Port 1 View  Result Date: 03/21/2020 CLINICAL DATA:  54 year old female with weakness and hypotension. EXAM: PORTABLE CHEST 1 VIEW COMPARISON:  Chest radiograph dated 03/25/2016 FINDINGS: Faint bilateral peripheral and subpleural densities, right greater left concerning for pneumonia, possibly atypical or viral in etiology including COVID-19. Clinical correlation is recommended. No focal consolidation, pleural effusion, pneumothorax. The cardiac silhouette is within limits. No acute osseous pathology. IMPRESSION: Findings concerning for pneumonia. Electronically Signed   By: Anner Crete M.D.   On: 03/21/2020 22:17   VAS [place] LOWER EXTREMITY VENOUS (DVT)  Result Date: 03/22/2020  Lower Venous DVTStudy Indications: Hx DVT and IVC filter, now with COVID and elevated D dimer.  Comparison Study: 06/07/15 negative Performing Technologist: June Leap RDMS, RVT  Examination Guidelines: A complete evaluation includes B-mode imaging, spectral Doppler, color Doppler, and power Doppler as needed of all accessible portions of each vessel. Bilateral testing is considered an integral part of a complete examination. Limited examinations for reoccurring indications may be performed as noted. The reflux portion of the exam is performed with the patient in reverse Trendelenburg.  +---------+---------------+---------+-----------+----------+-----------------+ RIGHT    CompressibilityPhasicitySpontaneityPropertiesThrombus  Aging    +---------+---------------+---------+-----------+----------+-----------------+ CFV      None           No  No                   Acute             +---------+---------------+---------+-----------+----------+-----------------+ SFJ      None                                         Acute             +---------+---------------+---------+-----------+----------+-----------------+ FV Prox  None                                         Acute             +---------+---------------+---------+-----------+----------+-----------------+ FV Mid   None                                         Acute             +---------+---------------+---------+-----------+----------+-----------------+ FV DistalNone                                         Acute             +---------+---------------+---------+-----------+----------+-----------------+ PFV      None                                         Acute             +---------+---------------+---------+-----------+----------+-----------------+ POP      None           No       No                   Acute             +---------+---------------+---------+-----------+----------+-----------------+ PTV      None                                         Acute             +---------+---------------+---------+-----------+----------+-----------------+ PERO     None                                         Acute             +---------+---------------+---------+-----------+----------+-----------------+ Gastroc  None                                         Acute             +---------+---------------+---------+-----------+----------+-----------------+ GSV      None  Acute             +---------+---------------+---------+-----------+----------+-----------------+ Ex Iliac                No       No                   Age Indeterminate  +---------+---------------+---------+-----------+----------+-----------------+ Com Iliac                                             Not visualized    +---------+---------------+---------+-----------+----------+-----------------+   +---------+---------------+---------+-----------+----------+-----------------+ LEFT     CompressibilityPhasicitySpontaneityPropertiesThrombus Aging    +---------+---------------+---------+-----------+----------+-----------------+ CFV      None           No       No                   Acute             +---------+---------------+---------+-----------+----------+-----------------+ SFJ      None                                         Acute             +---------+---------------+---------+-----------+----------+-----------------+ FV Prox  None                                         Acute             +---------+---------------+---------+-----------+----------+-----------------+ FV Mid   None                                         Acute             +---------+---------------+---------+-----------+----------+-----------------+ FV DistalNone                                         Acute             +---------+---------------+---------+-----------+----------+-----------------+ PFV      None                                         Acute             +---------+---------------+---------+-----------+----------+-----------------+ POP      None           No       No                   Acute             +---------+---------------+---------+-----------+----------+-----------------+ PTV      None                                         Acute             +---------+---------------+---------+-----------+----------+-----------------+ PERO  None                                         Acute             +---------+---------------+---------+-----------+----------+-----------------+ Gastroc  None                                          Acute             +---------+---------------+---------+-----------+----------+-----------------+ GSV      None                                         Acute             +---------+---------------+---------+-----------+----------+-----------------+ Ex Iliac                No       No                   Age Indeterminate +---------+---------------+---------+-----------+----------+-----------------+ Com Iliac                                             Not visualized    +---------+---------------+---------+-----------+----------+-----------------+     Summary: RIGHT: - Findings consistent with acute deep vein thrombosis involving the right common femoral vein, SF junction, right femoral vein, right proximal profunda vein, right popliteal vein, right posterior tibial veins, right peroneal veins, and right gastrocnemius veins. - Findings consistent with acute superficial vein thrombosis involving the right great saphenous vein. - Thrombosis extends into the Right Iliac vein and IVC up to the level of the filter placement.  LEFT: - Findings consistent with acute deep vein thrombosis involving the left common femoral vein, SF junction, left femoral vein, left proximal profunda vein, left popliteal vein, left posterior tibial veins, left peroneal veins, and left gastrocnemius veins. - Findings consistent with acute superficial vein thrombosis involving the left great saphenous vein. - Thrombosis extends into the Left Iliac vein.  *See table(s) above for measurements and observations. Electronically signed by Curt Jews MD on 03/22/2020 at 5:19:17 PM.    Final      Phillips Climes M.D on 03/25/2020 at 12:30 PM    [facility] -  Office  [phone]

## 2020-03-25 NOTE — Progress Notes (Signed)
ANTICOAGULATION CONSULT NOTE - Follow Up Consult  Pharmacy Consult for Heparin Indication:  DVTx2, D-dimer > 20, Covid  Allergies  Allergen Reactions   Metformin And Related Other (See Comments)    Chest pain   Penicillins Hives    Patient Measurements: Height: 5\' 11"  (180.3 cm) Weight: 94.2 kg (207 lb 10.8 oz) IBW/kg (Calculated) : 70.8   Vital Signs: Temp: 97.9 F (36.6 C) (06/05 0347) Temp Source: Oral (06/05 0347) BP: 106/64 (06/05 0347) Pulse Rate: 69 (06/05 0347)  Labs: Recent Labs    03/22/20 2219 03/23/20 0548 03/23/20 0548 03/23/20 1224 03/24/20 0834 03/24/20 1357 03/25/20 0506  HGB  --  12.1   < >  --  11.8*  --  11.3*  HCT  --  38.4  --   --  36.4  --  35.5*  PLT  --  141*  --   --  164  --  161  HEPARINUNFRC  --  0.69   < >  --  SEE [id number] 0.48 0.48  CREATININE   < >  --   --  0.85 0.79  --  0.79   < > = values in this interval not displayed.    Estimated Creatinine Clearance: 103 mL/min (by C-G formula based on SCr of 0.79 mg/dL).  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 Heparin level within goal range at 0.48 with heparin infusion at 1000 units/hr. H&H stable, platelet count up from baseline at 161. No bleeding noted.   Goal of Therapy:  Heparin level 0.3-0.7 units/ml Monitor platelets by anticoagulation protocol: Yes   Plan:  Continue heparin at 1000 units / hr Daily heparin level, CBC  Agnes Lawrence, PharmD PGY1 Pharmacy Resident

## 2020-03-26 ENCOUNTER — Encounter (HOSPITAL_COMMUNITY): Payer: Self-pay | Admitting: Critical Care Medicine

## 2020-03-26 ENCOUNTER — Encounter (HOSPITAL_COMMUNITY)
Admission: EM | Disposition: A | Discharge status: Home Health | Payer: Self-pay | Source: Home / Self Care | Attending: Internal Medicine

## 2020-03-26 LAB — CBC WITH DIFFERENTIAL/PLATELET
Abs Immature Granulocytes: 0.05 10*3/uL (ref 0.00–0.07)
Basophils Absolute: 0 10*3/uL (ref 0.0–0.1)
Basophils Relative: 0 %
Eosinophils Absolute: 0 10*3/uL (ref 0.0–0.5)
Eosinophils Relative: 0 %
HCT: 36 % (ref 36.0–46.0)
Hemoglobin: 11.2 g/dL — ABNORMAL LOW (ref 12.0–15.0)
Immature Granulocytes: 1 %
Lymphocytes Relative: 18 %
Lymphs Abs: 1.4 10*3/uL (ref 0.7–4.0)
MCH: 28.9 pg (ref 26.0–34.0)
MCHC: 31.1 g/dL (ref 30.0–36.0)
MCV: 93 fL (ref 80.0–100.0)
Monocytes Absolute: 0.5 10*3/uL (ref 0.1–1.0)
Monocytes Relative: 6 %
Neutro Abs: 5.8 10*3/uL (ref 1.7–7.7)
Neutrophils Relative %: 75 %
Platelets: 183 10*3/uL (ref 150–400)
RBC: 3.87 MIL/uL (ref 3.87–5.11)
RDW: 14 % (ref 11.5–15.5)
WBC: 7.7 10*3/uL (ref 4.0–10.5)
nRBC: 0.5 % — ABNORMAL HIGH (ref 0.0–0.2)

## 2020-03-26 LAB — COMPREHENSIVE METABOLIC PANEL
ALT: 24 U/L (ref 0–44)
AST: 21 U/L (ref 15–41)
Albumin: 2.9 g/dL — ABNORMAL LOW (ref 3.5–5.0)
Alkaline Phosphatase: 68 U/L (ref 38–126)
Anion gap: 10 (ref 5–15)
BUN: 26 mg/dL — ABNORMAL HIGH (ref 6–20)
CO2: 27 mmol/L (ref 22–32)
Calcium: 9.3 mg/dL (ref 8.9–10.3)
Chloride: 101 mmol/L (ref 98–111)
Creatinine, Ser: 0.95 mg/dL (ref 0.44–1.00)
GFR calc Af Amer: >60 mL/min (ref >60)
GFR calc non Af Amer: >60 mL/min (ref >60)
Glucose, Bld: 229 mg/dL — ABNORMAL HIGH (ref 70–99)
Potassium: 4.3 mmol/L (ref 3.5–5.1)
Sodium: 138 mmol/L (ref 135–145)
Total Bilirubin: 0.6 mg/dL (ref 0.3–1.2)
Total Protein: 6.2 g/dL — ABNORMAL LOW (ref 6.5–8.1)

## 2020-03-26 LAB — GLUCOSE, CAPILLARY
Glucose-Capillary: 175 mg/dL — ABNORMAL HIGH (ref 70–99)
Glucose-Capillary: 180 mg/dL — ABNORMAL HIGH (ref 70–99)
Glucose-Capillary: 217 mg/dL — ABNORMAL HIGH (ref 70–99)
Glucose-Capillary: 245 mg/dL — ABNORMAL HIGH (ref 70–99)

## 2020-03-26 LAB — D-DIMER, QUANTITATIVE: D-Dimer, Quant: 14.55 ug/mL-FEU — ABNORMAL HIGH (ref 0.00–0.50)

## 2020-03-26 LAB — CULTURE, BLOOD (ROUTINE X 2): Culture: NO GROWTH

## 2020-03-26 LAB — HEPARIN LEVEL (UNFRACTIONATED): Heparin Unfractionated: 0.39 IU/mL (ref 0.30–0.70)

## 2020-03-26 LAB — C-REACTIVE PROTEIN: CRP: 1.6 mg/dL — ABNORMAL HIGH (ref <1.0)

## 2020-03-26 SURGERY — APPLICATION OF ANGIOVAC
Anesthesia: General

## 2020-03-26 NOTE — Plan of Care (Signed)
  Problem: Clinical Measurements: Goal: Respiratory complications will improve Outcome: Progressing   

## 2020-03-26 NOTE — Progress Notes (Signed)
PROGRESS NOTE                                                                                                                                                                                                             Patient Demographics:    [name], is a 54 y.o. female, DOB - 07-Feb-1966, QMG:[id number]  Admit date - 03/21/2020   Admitting Physician Lenore Cordia, MD  Outpatient Primary MD for the patient is Glendale Chard, MD  LOS - 4   Chief Complaint  Patient presents with  . Hypotension  . Back Pain       Brief Narrative    [name] is a 54 y.o. female with medical history significant for type 2 diabetes, hypothyroidism, hyperlipidemia, history of PE, and history of colon cancer who presents to the ED for evaluation of new onset low back pain with lower extremity weakness and change in sensation, she was recently diagnosed with COVID-19 on 03/10/2020.  She did not require hospitalization or specific treatment at that time however has been having progressive fatigue and malaise since then.  She was given antibiotics for possible UTI earlier today.    Is presented to ED 6/1 secondary to complaints of lower back pain and weakness in both of her legs.  CTA chest PE study and CT abdomen/pelvis with contrast is negative for PE.  Focal patchy airspace opacities are seen throughout both lungs.  Hepatic steatosis is noted. -MRI thoracic/lumbar spine were significant for T7 disc herniation, does not with any focal deficits, but it did show evidence of IVC filter clotting, had venous Doppler showing extensive DVT in the bilateral lower extremities for which vascular surgeon has been consulted.     Subjective:    [name] today has any dyspnea,She denies cough or duspnea today.  Patient reports that he has improved.   Assessment  & Plan :    Principal Problem:   Sepsis due to COVID-19 [facility]) Active Problems:   Hyperlipidemia    Hypothyroidism   Type 2 diabetes mellitus without complication, without long-term current use of insulin ([employer])    Sepsis due to COVID-19 pneumonitis - SARS-CoV-2 PCR positive on 03/11/2019.  Has been having progressive fatigue, weakness.  CT imaging shows multifocal infiltrates. -Patient with no hypoxia, but there is imaging significant for multifocal opacity. -Continue with  Steroids,  -Continue with IV remdesivir. -Encouraged to use incentive spirometer, she is currently now at bedrest. -We will go ahead and discontinue her IV Rocephin and azithromycin today.  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 -Vascular surgery consult greatly appreciated, for now continue with full anticoagulation, patient will need mechanical thrombectomy with possible lysis, to be done next Tuesday/Wednesday at 2 days procedures, and where she will require ICU stay then .  Acute kidney injury: Likely secondary to sepsis.  ,  Resolved.  Thoracic radiculopathy -Patient initially presented with lower extremity weakness, this is most likely related to limited range of motion due to pain from her, discussed MRI findings with Dr. Arnoldo Morale, who reviewed her MRI thoracic/lumbar spine, there is no emergency, and patient can follow with neurosurgery as an outpatient if it does cause any symptoms.  Hyperglycemia and type 2 diabetes with mild DKA : -She is initially on insulin drip, this has been stopped, he has resolved. -CBG are improved after increasing her Lantus to 16 units, and add 4 units before meals, continue with sliding scale. -Started on Jardiance. -Beta hydroxybutyric acid has normalized. -Hemoglobin A1c is 8.6 -Required bicarb drip initially given low bicarb level, this has resolved  Hyperkalemia: -   Resolved with hydration  Hypothyroidism: Continue Synthroid.    Hyperlipidemia: Continue  simvastatin.    COVID-19 Labs  Recent Labs    03/24/20 0834 03/25/20 0506 03/26/20 0537  DDIMER 17.19* 16.87* 14.55*  CRP 6.1* 3.2* 1.6*    Lab Results  Component Value Date   SARSCOV2NAA POSITIVE (A) 03/10/2020     Code Status : Full   Family Communication  :  D/W patient Disposition Plan  :  Status is: Inpatient  Remains inpatient appropriate because:IV treatments appropriate due to intensity of illness or inability to take PO   Dispo: The patient is from: Home              Anticipated d/c is to: Home              Anticipated d/c date is: 3 days              Patient currently is not medically stable to d/c.      Consults  :  Vascular Surgery  Procedures  : None  DVT Prophylaxis  :  Heparin GTT  Lab Results  Component Value Date   PLT 183 03/26/2020    Antibiotics  :    Anti-infectives (From admission, onward)   Start     Dose/Rate Route Frequency Ordered Stop   03/23/20 2200  azithromycin (ZITHROMAX) tablet 500 mg  Status:  Discontinued     500 mg Oral Daily at bedtime 03/23/20 0806 03/25/20 1233   03/23/20 1000  remdesivir 100 mg in sodium chloride 0.9 % 100 mL IVPB     100 mg 200 mL/hr over 30 Minutes Intravenous Daily 03/22/20 0110 03/27/20 0959   03/22/20 2200  azithromycin (ZITHROMAX) 500 mg in sodium chloride 0.9 % 250 mL IVPB  Status:  Discontinued     500 mg 250 mL/hr over 60 Minutes Intravenous Every 24 hours 03/22/20 0111 03/23/20 0806   03/22/20 2200  cefTRIAXone (ROCEPHIN) 1 g in sodium chloride 0.9 % 100 mL IVPB  Status:  Discontinued     1 g 200 mL/hr over 30 Minutes Intravenous Every 24 hours 03/22/20 0111 03/25/20 1233   03/22/20 0200  remdesivir 200 mg in sodium  chloride 0.9% 250 mL IVPB     200 mg 580 mL/hr over 30 Minutes Intravenous Once 03/22/20 0110 03/22/20 1900   03/22/20 0015  cefTRIAXone (ROCEPHIN) 1 g in sodium chloride 0.9 % 100 mL IVPB     1 g 200 mL/hr over 30 Minutes Intravenous  Once 03/22/20 0010 03/22/20 0103     03/21/20 2300  cefTRIAXone (ROCEPHIN) injection 1 g  Status:  Discontinued     1 g Intramuscular  Once 03/21/20 2253 03/22/20 0010   03/21/20 2300  azithromycin (ZITHROMAX) 500 mg in sodium chloride 0.9 % 250 mL IVPB     500 mg 250 mL/hr over 60 Minutes Intravenous  Once 03/21/20 2253 03/22/20 0129        Objective:   Vitals:   03/26/20 0018 03/26/20 0415 03/26/20 0420 03/26/20 0805  BP: 118/71 (!) 98/45 94/65 105/67  Pulse: 73 64 60 72  Resp: 16 13 10  (!) 9  Temp: 98.1 F (36.7 C) 98 F (36.7 C)  98 F (36.7 C)  TempSrc: Oral Oral  Oral  SpO2: 99% 95% 98% 100%  Weight:      Height:        Wt Readings from Last 3 Encounters:  03/22/20 94.2 kg  02/15/20 91.2 kg  08/02/19 95.3 kg     Intake/Output Summary (Last 24 hours) at 03/26/2020 1105 Last data filed at 03/26/2020 1101 Gross per 24 hour  Intake 1343.3 ml  Output 1550 ml  Net -206.7 ml     Physical Exam  Awake Alert, Oriented X 3, No new F.N deficits, Normal affect Symmetrical Chest wall movement, Good air movement bilaterally, CTAB RRR,No Gallops,Rubs or new Murmurs, No Parasternal Heave +ve B.Sounds, Abd Soft, No tenderness, No rebound - guarding or rigidity. No Cyanosis, Clubbing , patient has extensive edema up to upper thighs.      Data Review:    CBC Recent Labs  Lab 03/22/20 0241 03/23/20 0548 03/24/20 0834 03/25/20 0506 03/26/20 0537  WBC 15.1* 14.6* 9.5 7.8 7.7  HGB 12.6 12.1 11.8* 11.3* 11.2*  HCT 40.6 38.4 36.4 35.5* 36.0  PLT 208 141* 164 161 183  MCV 95.1 92.8 91.9 92.2 93.0  MCH 29.5 29.2 29.8 29.4 28.9  MCHC 31.0 31.5 32.4 31.8 31.1  RDW 13.5 13.8 13.9 13.9 14.0  LYMPHSABS 0.8 0.8 0.6* 0.8 1.4  MONOABS 0.3 0.2 0.1 0.2 0.5  EOSABS 0.0 0.0 0.0 0.0 0.0  BASOSABS 0.0 0.0 0.0 0.0 0.0    Chemistries  Recent Labs  Lab 03/22/20 0241 03/22/20 0241 03/22/20 2219 03/23/20 1224 03/24/20 0834 03/25/20 0506 03/26/20 0537  NA 139   < > 137 137 134* 135 138  K 4.0   < > 4.7 4.1  4.0 4.4 4.3  CL 112*   < > 106 103 101 102 101  CO2 11*   < > 20* 20* 23 25 27   GLUCOSE 288*   < > 206* 224* 329* 276* 229*  BUN 26*   < > 26* 22* 24* 25* 26*  CREATININE 1.24*   < > 1.06* 0.85 0.79 0.79 0.95  CALCIUM 8.0*   < > 8.2* 8.3* 8.6* 8.7* 9.3  MG 1.5*  --   --  2.0  --   --   --   AST 17  --   --  23 22 21 21   ALT 19  --   --  19 21 25 24   ALKPHOS 77  --   --  74 84  75 68  BILITOT 0.8  --   --  0.9 0.7 0.7 0.6   < > = values in this interval not displayed.   ------------------------------------------------------------------------------------------------------------------ No results for input(s): CHOL, HDL, LDLCALC, TRIG, CHOLHDL, LDLDIRECT in the last 72 hours.  Lab Results  Component Value Date   HGBA1C 8.6 (H) 03/23/2020   ------------------------------------------------------------------------------------------------------------------ No results for input(s): TSH, T4TOTAL, T3FREE, THYROIDAB in the last 72 hours.  Invalid input(s): FREET3 ------------------------------------------------------------------------------------------------------------------ No results for input(s): VITAMINB12, FOLATE, FERRITIN, TIBC, IRON, RETICCTPCT in the last 72 hours.  Coagulation profile Recent Labs  Lab 03/21/20 2143  INR 1.5*    Recent Labs    03/25/20 0506 03/26/20 0537  DDIMER 16.87* 14.55*    Cardiac Enzymes No results for input(s): CKMB, TROPONINI, MYOGLOBIN in the last 168 hours.  Invalid input(s): CK ------------------------------------------------------------------------------------------------------------------    Component Value Date/Time   BNP 29.9 03/21/2020 2148    Inpatient Medications  Scheduled Meds: . vitamin C  500 mg Oral Daily  . Chlorhexidine Gluconate Cloth  6 each Topical Daily  . dexamethasone  4 mg Oral Daily  . famotidine  20 mg Oral BID  . feeding supplement (GLUCERNA SHAKE)  237 mL Oral TID BM  . insulin aspart  0-15 Units  Subcutaneous TID WC  . insulin aspart  0-5 Units Subcutaneous QHS  . insulin aspart  4 Units Subcutaneous TID WC  . insulin glargine  16 Units Subcutaneous Daily  . Ipratropium-Albuterol  1 puff Inhalation Q6H  . levothyroxine  100 mcg Oral Q0600  . linagliptin  5 mg Oral Daily  . simvastatin  10 mg Oral Daily  . sodium chloride flush  3 mL Intravenous Q12H  . zinc sulfate  220 mg Oral Daily   Continuous Infusions: . heparin 1,000 Units/hr (03/24/20 1536)  . remdesivir 100 mg in NS 100 mL 100 mg (03/26/20 1059)   PRN Meds:.acetaminophen, chlorpheniramine-HYDROcodone, dextrose, guaiFENesin-dextromethorphan, HYDROmorphone (DILAUDID) injection, loperamide, ondansetron **OR** ondansetron (ZOFRAN) IV, oxyCODONE, senna-docusate, zolpidem  Micro Results Recent Results (from the past 240 hour(s))  Blood Culture (routine x 2)     Status: None (Preliminary result)   Collection Time: 03/21/20 10:00 PM   Specimen: BLOOD  Result Value Ref Range Status   Specimen Description BLOOD LEFT ANTECUBITAL  Final   Special Requests   Final    BOTTLES DRAWN AEROBIC AND ANAEROBIC Blood Culture results may not be optimal due to an inadequate volume of blood received in culture bottles   Culture   Final    NO GROWTH 4 DAYS Performed at [facility], [place] [address] [place], [place] [address]    Report Status PENDING  Incomplete  Blood Culture (routine x 2)     Status: None (Preliminary result)   Collection Time: 03/21/20 11:41 PM   Specimen: BLOOD LEFT HAND  Result Value Ref Range Status   Specimen Description BLOOD LEFT HAND  Final   Special Requests   Final    BOTTLES DRAWN AEROBIC AND ANAEROBIC Blood Culture adequate volume   Culture   Final    NO GROWTH 3 DAYS Performed at [facility], [place] [address] [place], [place] [address]    Report Status PENDING  Incomplete  Urine culture     Status: None   Collection Time: 03/22/20  1:48 AM   Specimen: In/Out Cath Urine  Result Value Ref  Range Status   Specimen Description IN/OUT CATH URINE  Final   Special Requests NONE  Final  Culture   Final    NO GROWTH Performed at [facility], [place] [address], [place], [place] [address]    Report Status 03/22/2020 FINAL  Final  MRSA PCR Screening     Status: None   Collection Time: 03/22/20 12:32 PM   Specimen: Nasal Mucosa; Nasopharyngeal  Result Value Ref Range Status   MRSA by PCR NEGATIVE NEGATIVE Final    Comment:        The GeneXpert MRSA Assay (FDA approved for NASAL specimens only), is one component of a comprehensive MRSA colonization surveillance program. It is not intended to diagnose MRSA infection nor to guide or monitor treatment for MRSA infections. Performed at [facility], [place] [address] [place],  [address]     Radiology Reports CT Angio Chest PE W and/or Wo Contrast  Result Date: 03/22/2020 CLINICAL DATA:  Severe back pain hypotension and shallow EXAM: CT ANGIOGRAPHY CHEST WITH CONTRAST TECHNIQUE: Multidetector CT imaging of the chest was performed using the standard protocol during bolus administration of intravenous contrast. Multiplanar CT image reconstructions and MIPs were obtained to evaluate the vascular anatomy. CONTRAST:  63mL OMNIPAQUE IOHEXOL 350 MG/ML SOLN COMPARISON:  None. FINDINGS: Cardiovascular: There is a optimal opacification of the pulmonary arteries. There is no central,segmental, or subsegmental filling defects within the pulmonary arteries. The heart is normal in size. No pericardial effusion or thickening. No evidence right heart strain. There is normal three-vessel brachiocephalic anatomy without proximal stenosis. The thoracic aorta is normal in appearance. Mediastinum/Nodes: No hilar, mediastinal, or axillary adenopathy. Thyroid gland, trachea, and esophagus demonstrate no significant findings. Lungs/Pleura: Multifocal patchy ground-glass opacities are seen predominantly within the periphery of both lungs. No  pleural effusion or pneumothorax is seen. Upper Abdomen: No acute abnormalities present in the visualized portions of the upper abdomen. Musculoskeletal: No chest wall abnormality. No acute or significant osseous findings. Review of the MIP images confirms the above findings. Abdomen/pelvis: Hepatobiliary: There is diffuse low density seen throughout the liver parenchyma. The main portal vein is patent. No evidence of calcified gallstones, gallbladder wall thickening or biliary dilatation. Pancreas: Unremarkable. No pancreatic ductal dilatation or surrounding inflammatory changes. Spleen: Normal in size without focal abnormality. Adrenals/Urinary Tract: Both adrenal glands appear normal. The kidneys and collecting system appear normal without evidence of urinary tract calculus or hydronephrosis. Bladder is unremarkable. Stomach/Bowel: The stomach, small bowel, and colon are normal in appearance. No inflammatory changes, wall thickening, or obstructive findings. There appears to be a surgical anastomosis at the sigmoid rectal junction. Mild presacral soft tissue density is again identified. Vascular/Lymphatic: There are no enlarged mesenteric, retroperitoneal, or pelvic lymph nodes. An infrarenal IVC filter is seen at approximately the L3 level. There is scattered aortic and bi-iliac calcifications noted. Reproductive: The uterus and adnexa are unremarkable. Other: No evidence of abdominal wall mass or hernia. Musculoskeletal: No acute or significant osseous findings. IMPRESSION: No central, segmental, or subsegmental pulmonary embolism. Multifocal patchy airspace opacities throughout both lungs, likely consistent with multifocal pneumonia. Hepatic steatosis Aortic Atherosclerosis (ICD10-I70.0). Electronically Signed   By: Prudencio Pair M.D.   On: 03/22/2020 00:07   MR THORACIC SPINE W WO CONTRAST  Result Date: 03/22/2020 CLINICAL DATA:  Bilateral lower extremity pain and numbness with urinary retention. Diabetes.  History of colon cancer. EXAM: MRI THORACIC WITHOUT AND WITH CONTRAST TECHNIQUE: Multiplanar and multiecho pulse sequences of the thoracic spine were obtained without and with intravenous contrast. CONTRAST:  9.82mL GADAVIST GADOBUTROL 1 MMOL/ML IV SOLN COMPARISON:  CT  chest done yesterday. FINDINGS: MRI THORACIC SPINE FINDINGS Alignment:  Normal Vertebrae: Normal. No fracture or primary bone lesion. No metastatic disease. Insignificant small hemangioma within the T11 vertebral body. Cord: No primary cord pathology. See below regarding stenosis at T7-8. Paraspinal and other soft tissues: Negative Disc levels: No abnormality at T5-6 or above. T6-7: Small right paracentral disc protrusion indents the thecal sac slightly but does not cause apparent neural compression. T7-8: Left posterolateral disc herniation effaces the ventral subarachnoid space and indents the left side of the cord. This could be symptomatic. T8-9 through T12-L1: Normal. 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 Ample subarachnoid space is present dorsal to the cord at this level. None the less, this could possibly be symptomatic. Small right paracentral disc herniation at T6-7 without apparent neural compression. Electronically Signed   By: Nelson Chimes M.D.   On: 03/22/2020 12:32   MR Lumbar Spine W Wo Contrast  Result Date: 03/22/2020 CLINICAL DATA:  Back pain.  Urinary retention.  Diabetes. EXAM: MRI LUMBAR SPINE WITHOUT AND WITH CONTRAST TECHNIQUE: Multiplanar and multiecho pulse sequences of the lumbar spine were obtained without and with intravenous contrast. CONTRAST:  9.60mL GADAVIST GADOBUTROL 1 MMOL/ML IV SOLN COMPARISON:  None. FINDINGS: Segmentation:  5 lumbar type vertebral bodies. Alignment:  Slightly exaggerated lower lumbar lordosis. Vertebrae:  No fracture or primary bone lesion. Conus medullaris and cauda equina:  Conus extends to the L1 level. Conus and cauda equina appear normal. Paraspinal and other soft tissues: There appears be thrombosis of the IVC and both iliac veins up to the level of the IVC filter. Flow is restored in the IVC by inflow from the renal veins. Disc levels: No abnormality from L1-2 through L2-3. L3-4: No disc abnormality.  Mild facet hypertrophy.  No stenosis. L4-5: No disc abnormality. Mild to moderate facet hypertrophy. No stenosis. L5-S1: Minimal disc bulge.  Mild facet hypertrophy.  No stenosis. IMPRESSION: No significant disc pathology. No stenosis or neural compression in the lumbar region. Patient has exaggerated lumbar lordosis. 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 Flow is restored in the IVC above that by inflow from the renal veins. I am in the process of calling this report. Electronically Signed   By: Nelson Chimes M.D.   On: 03/22/2020 12:46   CT ABDOMEN PELVIS W CONTRAST  Result Date: 03/22/2020 CLINICAL DATA:  Severe back pain hypotension and shallow EXAM: CT ANGIOGRAPHY CHEST WITH CONTRAST TECHNIQUE: Multidetector CT imaging of the chest was performed using the standard protocol during bolus administration of intravenous contrast. Multiplanar CT image reconstructions and MIPs were obtained to evaluate the vascular anatomy. CONTRAST:  63mL OMNIPAQUE IOHEXOL 350 MG/ML SOLN COMPARISON:  None. FINDINGS: Cardiovascular: There is a optimal opacification of the pulmonary arteries. There is no central,segmental, or subsegmental filling defects within the pulmonary arteries. The heart is normal in size. No pericardial effusion or thickening. No evidence right heart strain. There is normal three-vessel brachiocephalic anatomy without proximal stenosis. The thoracic aorta is normal in appearance.  Mediastinum/Nodes: No hilar, mediastinal, or axillary adenopathy. Thyroid gland, trachea, and esophagus demonstrate no significant findings. Lungs/Pleura: Multifocal patchy ground-glass opacities are seen predominantly within the periphery of both lungs. No  pleural effusion or pneumothorax is seen. Upper Abdomen: No acute abnormalities present in the visualized portions of the upper abdomen. Musculoskeletal: No chest wall abnormality. No acute or significant osseous findings. Review of the MIP images confirms the above findings. Abdomen/pelvis: Hepatobiliary: There is diffuse low density seen throughout the liver parenchyma. The main portal vein is patent. No evidence of calcified gallstones, gallbladder wall thickening or biliary dilatation. Pancreas: Unremarkable. No pancreatic ductal dilatation or surrounding inflammatory changes. Spleen: Normal in size without focal abnormality. Adrenals/Urinary Tract: Both adrenal glands appear normal. The kidneys and collecting system appear normal without evidence of urinary tract calculus or hydronephrosis. Bladder is unremarkable. Stomach/Bowel: The stomach, small bowel, and colon are normal in appearance. No inflammatory changes, wall thickening, or obstructive findings. There appears to be a surgical anastomosis at the sigmoid rectal junction. Mild presacral soft tissue density is again identified. Vascular/Lymphatic: There are no enlarged mesenteric, retroperitoneal, or pelvic lymph nodes. An infrarenal IVC filter is seen at approximately the L3 level. There is scattered aortic and bi-iliac calcifications noted. Reproductive: The uterus and adnexa are unremarkable. Other: No evidence of abdominal wall mass or hernia. Musculoskeletal: No acute or significant osseous findings. IMPRESSION: No central, segmental, or subsegmental pulmonary embolism. Multifocal patchy airspace opacities throughout both lungs, likely consistent with multifocal pneumonia. Hepatic steatosis  Aortic Atherosclerosis (ICD10-I70.0). Electronically Signed   By: Prudencio Pair M.D.   On: 03/22/2020 00:07   DG Chest Port 1 View  Result Date: 03/21/2020 CLINICAL DATA:  54 year old female with weakness and hypotension. EXAM: PORTABLE CHEST 1 VIEW COMPARISON:  Chest radiograph dated 03/25/2016 FINDINGS: Faint bilateral peripheral and subpleural densities, right greater left concerning for pneumonia, possibly atypical or viral in etiology including COVID-19. Clinical correlation is recommended. No focal consolidation, pleural effusion, pneumothorax. The cardiac silhouette is within limits. No acute osseous pathology. IMPRESSION: Findings concerning for pneumonia. Electronically Signed   By: Anner Crete M.D.   On: 03/21/2020 22:17   VAS [place] LOWER EXTREMITY VENOUS (DVT)  Result Date: 03/22/2020  Lower Venous DVTStudy Indications: Hx DVT and IVC filter, now with COVID and elevated D dimer.  Comparison Study: 06/07/15 negative Performing Technologist: June Leap RDMS, RVT  Examination Guidelines: A complete evaluation includes B-mode imaging, spectral Doppler, color Doppler, and power Doppler as needed of all accessible portions of each vessel. Bilateral testing is considered an integral part of a complete examination. Limited examinations for reoccurring indications may be performed as noted. The reflux portion of the exam is performed with the patient in reverse Trendelenburg.  +---------+---------------+---------+-----------+----------+-----------------+ RIGHT    CompressibilityPhasicitySpontaneityPropertiesThrombus Aging    +---------+---------------+---------+-----------+----------+-----------------+ CFV      None           No       No                   Acute             +---------+---------------+---------+-----------+----------+-----------------+ SFJ      None                                         Acute              +---------+---------------+---------+-----------+----------+-----------------+ FV Prox  None  Acute             +---------+---------------+---------+-----------+----------+-----------------+ FV Mid   None                                         Acute             +---------+---------------+---------+-----------+----------+-----------------+ FV DistalNone                                         Acute             +---------+---------------+---------+-----------+----------+-----------------+ PFV      None                                         Acute             +---------+---------------+---------+-----------+----------+-----------------+ POP      None           No       No                   Acute             +---------+---------------+---------+-----------+----------+-----------------+ PTV      None                                         Acute             +---------+---------------+---------+-----------+----------+-----------------+ PERO     None                                         Acute             +---------+---------------+---------+-----------+----------+-----------------+ Gastroc  None                                         Acute             +---------+---------------+---------+-----------+----------+-----------------+ GSV      None                                         Acute             +---------+---------------+---------+-----------+----------+-----------------+ Ex Iliac                No       No                   Age Indeterminate +---------+---------------+---------+-----------+----------+-----------------+ Com Iliac                                             Not visualized    +---------+---------------+---------+-----------+----------+-----------------+   +---------+---------------+---------+-----------+----------+-----------------+ LEFT      CompressibilityPhasicitySpontaneityPropertiesThrombus Aging    +---------+---------------+---------+-----------+----------+-----------------+ CFV  None           No       No                   Acute             +---------+---------------+---------+-----------+----------+-----------------+ SFJ      None                                         Acute             +---------+---------------+---------+-----------+----------+-----------------+ FV Prox  None                                         Acute             +---------+---------------+---------+-----------+----------+-----------------+ FV Mid   None                                         Acute             +---------+---------------+---------+-----------+----------+-----------------+ FV DistalNone                                         Acute             +---------+---------------+---------+-----------+----------+-----------------+ PFV      None                                         Acute             +---------+---------------+---------+-----------+----------+-----------------+ POP      None           No       No                   Acute             +---------+---------------+---------+-----------+----------+-----------------+ PTV      None                                         Acute             +---------+---------------+---------+-----------+----------+-----------------+ PERO     None                                         Acute             +---------+---------------+---------+-----------+----------+-----------------+ Gastroc  None                                         Acute             +---------+---------------+---------+-----------+----------+-----------------+ GSV      None  Acute             +---------+---------------+---------+-----------+----------+-----------------+ Ex Iliac                No       No                   Age  Indeterminate +---------+---------------+---------+-----------+----------+-----------------+ Com Iliac                                             Not visualized    +---------+---------------+---------+-----------+----------+-----------------+     Summary: RIGHT: - Findings consistent with acute deep vein thrombosis involving the right common femoral vein, SF junction, right femoral vein, right proximal profunda vein, right popliteal vein, right posterior tibial veins, right peroneal veins, and right gastrocnemius veins. - Findings consistent with acute superficial vein thrombosis involving the right great saphenous vein. - Thrombosis extends into the Right Iliac vein and IVC up to the level of the filter placement.  LEFT: - Findings consistent with acute deep vein thrombosis involving the left common femoral vein, SF junction, left femoral vein, left proximal profunda vein, left popliteal vein, left posterior tibial veins, left peroneal veins, and left gastrocnemius veins. - Findings consistent with acute superficial vein thrombosis involving the left great saphenous vein. - Thrombosis extends into the Left Iliac vein.  *See table(s) above for measurements and observations. Electronically signed by Curt Jews MD on 03/22/2020 at 5:19:17 PM.    Final      Phillips Climes M.D on 03/26/2020 at 11:05 AM    [facility] -  Office  [phone]

## 2020-03-26 NOTE — Progress Notes (Signed)
ANTICOAGULATION CONSULT NOTE  Pharmacy Consult for Heparin Indication:  DVTs  Allergies  Allergen Reactions   Metformin And Related Other (See Comments)    Chest pain   Penicillins Hives    Patient Measurements: Height: 5\' 11"  (180.3 cm) Weight: 94.2 kg (207 lb 10.8 oz) IBW/kg (Calculated) : 70.8  Vital Signs: Temp: 98 F (36.7 C) (06/06 1108) Temp Source: Oral (06/06 1108) BP: 99/67 (06/06 1108) Pulse Rate: 61 (06/06 1108)  Labs: Recent Labs    03/24/20 0834 03/24/20 0834 03/24/20 1357 03/25/20 0506 03/26/20 0537  HGB 11.8*   < >  --  11.3* 11.2*  HCT 36.4  --   --  35.5* 36.0  PLT 164  --   --  161 183  HEPARINUNFRC SEE [id number]   < > 0.48 0.48 0.39  CREATININE 0.79  --   --  0.79 0.95   < > = values in this interval not displayed.    Estimated Creatinine Clearance: 86.7 mL/min (by C-G formula based on SCr of 0.95 mg/dL).  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  Heparin level therapeutic; no bleeding reported.   Goal of Therapy:  Heparin level 0.3-0.7 units/ml Monitor platelets by anticoagulation protocol: Yes   Plan:  Continue heparin gtt at 1000 units/hr Daily heparin level and CBC F/u Vascular plans for lytics  Lauralynn Loeb D. Mina Marble, PharmD, BCPS, [place] 03/26/2020, 1:20 PM

## 2020-03-27 DIAGNOSIS — I82403 Acute embolism and thrombosis of unspecified deep veins of lower extremity, bilateral: Secondary | ICD-10-CM

## 2020-03-27 DIAGNOSIS — Z86711 Personal history of pulmonary embolism: Secondary | ICD-10-CM

## 2020-03-27 LAB — CBC
HCT: 36.1 % (ref 36.0–46.0)
Hemoglobin: 11.5 g/dL — ABNORMAL LOW (ref 12.0–15.0)
MCH: 29.6 pg (ref 26.0–34.0)
MCHC: 31.9 g/dL (ref 30.0–36.0)
MCV: 93 fL (ref 80.0–100.0)
Platelets: 167 10*3/uL (ref 150–400)
RBC: 3.88 MIL/uL (ref 3.87–5.11)
RDW: 14.4 % (ref 11.5–15.5)
WBC: 8.2 10*3/uL (ref 4.0–10.5)
nRBC: 0.2 % (ref 0.0–0.2)

## 2020-03-27 LAB — COMPREHENSIVE METABOLIC PANEL
ALT: 30 U/L (ref 0–44)
AST: 20 U/L (ref 15–41)
Albumin: 2.9 g/dL — ABNORMAL LOW (ref 3.5–5.0)
Alkaline Phosphatase: 73 U/L (ref 38–126)
Anion gap: 7 (ref 5–15)
BUN: 26 mg/dL — ABNORMAL HIGH (ref 6–20)
CO2: 26 mmol/L (ref 22–32)
Calcium: 9.1 mg/dL (ref 8.9–10.3)
Chloride: 102 mmol/L (ref 98–111)
Creatinine, Ser: 0.92 mg/dL (ref 0.44–1.00)
GFR calc Af Amer: >60 mL/min (ref >60)
GFR calc non Af Amer: >60 mL/min (ref >60)
Glucose, Bld: 214 mg/dL — ABNORMAL HIGH (ref 70–99)
Potassium: 4.1 mmol/L (ref 3.5–5.1)
Sodium: 135 mmol/L (ref 135–145)
Total Bilirubin: 0.7 mg/dL (ref 0.3–1.2)
Total Protein: 6.1 g/dL — ABNORMAL LOW (ref 6.5–8.1)

## 2020-03-27 LAB — GLUCOSE, CAPILLARY
Glucose-Capillary: 166 mg/dL — ABNORMAL HIGH (ref 70–99)
Glucose-Capillary: 219 mg/dL — ABNORMAL HIGH (ref 70–99)
Glucose-Capillary: 251 mg/dL — ABNORMAL HIGH (ref 70–99)
Glucose-Capillary: 175 mg/dL — ABNORMAL HIGH (ref 70–99)

## 2020-03-27 LAB — CULTURE, BLOOD (ROUTINE X 2)
Culture: NO GROWTH
Special Requests: ADEQUATE

## 2020-03-27 LAB — HEPARIN LEVEL (UNFRACTIONATED): Heparin Unfractionated: 0.52 IU/mL (ref 0.30–0.70)

## 2020-03-27 LAB — C-REACTIVE PROTEIN: CRP: 1.3 mg/dL — ABNORMAL HIGH (ref <1.0)

## 2020-03-27 NOTE — Progress Notes (Signed)
PROGRESS NOTE                                                                                                                                                                                                             Patient Demographics:    [name], is a 54 y.o. female, DOB - August 03, 1966, LGX:[id number]  Admit date - 03/21/2020   Admitting Physician Lenore Cordia, MD  Outpatient Primary MD for the patient is Glendale Chard, MD  LOS - 5   Chief Complaint  Patient presents with  . Hypotension  . Back Pain       Brief Narrative    [name] is a 54 y.o. female with medical history significant for type 2 diabetes, hypothyroidism, hyperlipidemia, history of PE, and history of colon cancer who presents to the ED for evaluation of new onset low back pain with lower extremity weakness and change in sensation, she was recently diagnosed with COVID-19 on 03/10/2020.  She did not require hospitalization or specific treatment at that time however has been having progressive fatigue and malaise since then.  She was given antibiotics for possible UTI earlier today.    Is presented to ED 6/1 secondary to complaints of lower back pain and weakness in both of her legs.  CTA chest PE study and CT abdomen/pelvis with contrast is negative for PE.  Focal patchy airspace opacities are seen throughout both lungs.  Hepatic steatosis is noted. -MRI thoracic/lumbar spine were significant for T7 disc herniation, does not with any focal deficits, but it did show evidence of IVC filter clotting, had venous Doppler showing extensive DVT in the bilateral lower extremities for which vascular surgeon has been consulted.    Subjective:    [name] today he denies any dyspnea, cough, reports have some worsening lower extremity swelling and pain .   Assessment  & Plan :    Principal Problem:   Sepsis due to COVID-19 [facility]) Active Problems:   Hyperlipidemia    Hypothyroidism   Type 2 diabetes mellitus without complication, without long-term current use of insulin ([place])    Sepsis due to COVID-19 pneumonitis - SARS-CoV-2 PCR positive on 03/11/2019.  Has been having progressive fatigue, weakness.  CT imaging shows multifocal infiltrates. -Patient with no hypoxia, but there is imaging significant for multifocal opacity. -Continue with  Steroids,  -treated  with IV remdesivir. -Encouraged to use incentive spirometer, she is currently now at bedrest. -She is currently off IV antibiotics.  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 -Vascular surgery consult greatly appreciated, for now continue with full anticoagulation, patient will need mechanical thrombectomy with possible lysis, to be done next Tuesday/Wednesday at 2 days procedures, and where she will require ICU stay then .  Acute kidney injury: Likely secondary to sepsis.  ,  Resolved.  Thoracic radiculopathy -Patient initially presented with lower extremity weakness, this is most likely related to limited range of motion due to pain from her, discussed MRI findings with Dr. Arnoldo Morale, who reviewed her MRI thoracic/lumbar spine, there is no emergency, and patient can follow with neurosurgery as an outpatient if it does cause any symptoms.  Hyperglycemia and type 2 diabetes with mild DKA : -She is initially on insulin drip, this has been stopped, he has resolved. -CBG are improved after increasing her Lantus to 16 units, and add 4 units before meals, continue with sliding scale. -Started on Jardiance. -Beta hydroxybutyric acid has normalized. -Hemoglobin A1c is 8.6 -Required bicarb drip initially given low bicarb level, this has resolved  Hyperkalemia: -   Resolved with hydration  Hypothyroidism: Continue Synthroid.    Hyperlipidemia: Continue simvastatin.  Will discontinue her Foley  catheter if there is no indication for it during her anticipated surgery tomorrow, otherwise will keep if it is required  COVID-19 Labs  Recent Labs    03/25/20 0506 03/26/20 0537  DDIMER 16.87* 14.55*  CRP 3.2* 1.6*    Lab Results  Component Value Date   SARSCOV2NAA POSITIVE (A) 03/10/2020     Code Status : Full   Family Communication  :  D/W patient Disposition Plan  :  Status is: Inpatient  Remains inpatient appropriate because:IV treatments appropriate due to intensity of illness or inability to take PO   Dispo: The patient is from: Home              Anticipated d/c is to: Home              Anticipated d/c date is: 3 days              Patient currently is not medically stable to d/c.      Consults  :  Vascular Surgery  Procedures  : None  DVT Prophylaxis  :  Heparin GTT  Lab Results  Component Value Date   PLT 183 03/26/2020    Antibiotics  :    Anti-infectives (From admission, onward)   Start     Dose/Rate Route Frequency Ordered Stop   03/23/20 2200  azithromycin (ZITHROMAX) tablet 500 mg  Status:  Discontinued     500 mg Oral Daily at bedtime 03/23/20 0806 03/25/20 1233   03/23/20 1000  remdesivir 100 mg in sodium chloride 0.9 % 100 mL IVPB     100 mg 200 mL/hr over 30 Minutes Intravenous Daily 03/22/20 0110 03/26/20 1913   03/22/20 2200  azithromycin (ZITHROMAX) 500 mg in sodium chloride 0.9 % 250 mL IVPB  Status:  Discontinued     500 mg 250 mL/hr over 60 Minutes Intravenous Every 24 hours 03/22/20 0111 03/23/20 0806   03/22/20 2200  cefTRIAXone (ROCEPHIN) 1 g in sodium chloride 0.9 % 100 mL IVPB  Status:  Discontinued     1 g 200 mL/hr over 30 Minutes Intravenous Every 24 hours  03/22/20 0111 03/25/20 1233   03/22/20 0200  remdesivir 200 mg in sodium chloride 0.9% 250 mL IVPB     200 mg 580 mL/hr over 30 Minutes Intravenous Once 03/22/20 0110 03/22/20 1900   03/22/20 0015  cefTRIAXone (ROCEPHIN) 1 g in sodium chloride 0.9 % 100 mL IVPB     1  g 200 mL/hr over 30 Minutes Intravenous  Once 03/22/20 0010 03/22/20 0103   03/21/20 2300  cefTRIAXone (ROCEPHIN) injection 1 g  Status:  Discontinued     1 g Intramuscular  Once 03/21/20 2253 03/22/20 0010   03/21/20 2300  azithromycin (ZITHROMAX) 500 mg in sodium chloride 0.9 % 250 mL IVPB     500 mg 250 mL/hr over 60 Minutes Intravenous  Once 03/21/20 2253 03/22/20 0129        Objective:   Vitals:   03/26/20 2107 03/26/20 2348 03/27/20 0329 03/27/20 0746  BP: 104/88 108/72 101/64 106/67  Pulse: 60 62 63 62  Resp: 10 15 15 16   Temp: 97.8 F (36.6 C) 98 F (36.7 C) 98 F (36.7 C) 97.8 F (36.6 C)  TempSrc: Oral Oral Oral Oral  SpO2: 96% 94% 95% 98%  Weight:      Height:        Wt Readings from Last 3 Encounters:  03/22/20 94.2 kg  02/15/20 91.2 kg  08/02/19 95.3 kg     Intake/Output Summary (Last 24 hours) at 03/27/2020 1135 Last data filed at 03/27/2020 1000 Gross per 24 hour  Intake 809.77 ml  Output 1125 ml  Net -315.23 ml     Physical Exam  Awake Alert, Oriented X 3, No new F.N deficits, Normal affect Symmetrical Chest wall movement, Good air movement bilaterally, CTAB RRR,No Gallops,Rubs or new Murmurs, No Parasternal Heave +ve B.Sounds, Abd Soft, No tenderness, No rebound - guarding or rigidity. No Cyanosis, Clubbing, she has significant lower extremity edema      Data Review:    CBC Recent Labs  Lab 03/22/20 0241 03/23/20 0548 03/24/20 0834 03/25/20 0506 03/26/20 0537  WBC 15.1* 14.6* 9.5 7.8 7.7  HGB 12.6 12.1 11.8* 11.3* 11.2*  HCT 40.6 38.4 36.4 35.5* 36.0  PLT 208 141* 164 161 183  MCV 95.1 92.8 91.9 92.2 93.0  MCH 29.5 29.2 29.8 29.4 28.9  MCHC 31.0 31.5 32.4 31.8 31.1  RDW 13.5 13.8 13.9 13.9 14.0  LYMPHSABS 0.8 0.8 0.6* 0.8 1.4  MONOABS 0.3 0.2 0.1 0.2 0.5  EOSABS 0.0 0.0 0.0 0.0 0.0  BASOSABS 0.0 0.0 0.0 0.0 0.0    Chemistries  Recent Labs  Lab 03/22/20 0241 03/22/20 0241 03/22/20 2219 03/23/20 1224 03/24/20 0834  03/25/20 0506 03/26/20 0537  NA 139   < > 137 137 134* 135 138  K 4.0   < > 4.7 4.1 4.0 4.4 4.3  CL 112*   < > 106 103 101 102 101  CO2 11*   < > 20* 20* 23 25 27   GLUCOSE 288*   < > 206* 224* 329* 276* 229*  BUN 26*   < > 26* 22* 24* 25* 26*  CREATININE 1.24*   < > 1.06* 0.85 0.79 0.79 0.95  CALCIUM 8.0*   < > 8.2* 8.3* 8.6* 8.7* 9.3  MG 1.5*  --   --  2.0  --   --   --   AST 17  --   --  23 22 21 21   ALT 19  --   --  19 21 25  24  ALKPHOS 77  --   --  74 84 75 68  BILITOT 0.8  --   --  0.9 0.7 0.7 0.6   < > = values in this interval not displayed.   ------------------------------------------------------------------------------------------------------------------ No results for input(s): CHOL, HDL, LDLCALC, TRIG, CHOLHDL, LDLDIRECT in the last 72 hours.  Lab Results  Component Value Date   HGBA1C 8.6 (H) 03/23/2020   ------------------------------------------------------------------------------------------------------------------ No results for input(s): TSH, T4TOTAL, T3FREE, THYROIDAB in the last 72 hours.  Invalid input(s): FREET3 ------------------------------------------------------------------------------------------------------------------ No results for input(s): VITAMINB12, FOLATE, FERRITIN, TIBC, IRON, RETICCTPCT in the last 72 hours.  Coagulation profile Recent Labs  Lab 03/21/20 2143  INR 1.5*    Recent Labs    03/25/20 0506 03/26/20 0537  DDIMER 16.87* 14.55*    Cardiac Enzymes No results for input(s): CKMB, TROPONINI, MYOGLOBIN in the last 168 hours.  Invalid input(s): CK ------------------------------------------------------------------------------------------------------------------    Component Value Date/Time   BNP 29.9 03/21/2020 2148    Inpatient Medications  Scheduled Meds: . vitamin C  500 mg Oral Daily  . Chlorhexidine Gluconate Cloth  6 each Topical Daily  . dexamethasone  4 mg Oral Daily  . famotidine  20 mg Oral BID  . feeding  supplement (GLUCERNA SHAKE)  237 mL Oral TID BM  . insulin aspart  0-15 Units Subcutaneous TID WC  . insulin aspart  0-5 Units Subcutaneous QHS  . insulin aspart  4 Units Subcutaneous TID WC  . insulin glargine  16 Units Subcutaneous Daily  . Ipratropium-Albuterol  1 puff Inhalation Q6H  . levothyroxine  100 mcg Oral Q0600  . linagliptin  5 mg Oral Daily  . simvastatin  10 mg Oral Daily  . sodium chloride flush  3 mL Intravenous Q12H  . zinc sulfate  220 mg Oral Daily   Continuous Infusions: . heparin 1,000 Units/hr (03/26/20 1524)   PRN Meds:.acetaminophen, chlorpheniramine-HYDROcodone, dextrose, guaiFENesin-dextromethorphan, HYDROmorphone (DILAUDID) injection, loperamide, ondansetron **OR** ondansetron (ZOFRAN) IV, oxyCODONE, senna-docusate, zolpidem  Micro Results Recent Results (from the past 240 hour(s))  Blood Culture (routine x 2)     Status: None   Collection Time: 03/21/20 10:00 PM   Specimen: BLOOD  Result Value Ref Range Status   Specimen Description BLOOD LEFT ANTECUBITAL  Final   Special Requests   Final    BOTTLES DRAWN AEROBIC AND ANAEROBIC Blood Culture results may not be optimal due to an inadequate volume of blood received in culture bottles   Culture   Final    NO GROWTH 5 DAYS Performed at [facility], [place] [address], [place], [place] [address]    Report Status 03/26/2020 FINAL  Final  Blood Culture (routine x 2)     Status: None   Collection Time: 03/21/20 11:41 PM   Specimen: BLOOD LEFT HAND  Result Value Ref Range Status   Specimen Description BLOOD LEFT HAND  Final   Special Requests   Final    BOTTLES DRAWN AEROBIC AND ANAEROBIC Blood Culture adequate volume   Culture   Final    NO GROWTH 5 DAYS Performed at [facility], [place] [address], [place], [place] [address]    Report Status 03/27/2020 FINAL  Final  Urine culture     Status: None   Collection Time: 03/22/20  1:48 AM   Specimen: In/Out Cath Urine  Result Value Ref Range Status     Specimen Description IN/OUT CATH URINE  Final   Special Requests NONE  Final   Culture  Final    NO GROWTH Performed at [facility], [place] [address], [place], [place] [address]    Report Status 03/22/2020 FINAL  Final  MRSA PCR Screening     Status: None   Collection Time: 03/22/20 12:32 PM   Specimen: Nasal Mucosa; Nasopharyngeal  Result Value Ref Range Status   MRSA by PCR NEGATIVE NEGATIVE Final    Comment:        The GeneXpert MRSA Assay (FDA approved for NASAL specimens only), is one component of a comprehensive MRSA colonization surveillance program. It is not intended to diagnose MRSA infection nor to guide or monitor treatment for MRSA infections. Performed at [facility], [place] [address] [place], [place] [address]     Radiology Reports CT Angio Chest PE W and/or Wo Contrast  Result Date: 03/22/2020 CLINICAL DATA:  Severe back pain hypotension and shallow EXAM: CT ANGIOGRAPHY CHEST WITH CONTRAST TECHNIQUE: Multidetector CT imaging of the chest was performed using the standard protocol during bolus administration of intravenous contrast. Multiplanar CT image reconstructions and MIPs were obtained to evaluate the vascular anatomy. CONTRAST:  80mL OMNIPAQUE IOHEXOL 350 MG/ML SOLN COMPARISON:  None. FINDINGS: Cardiovascular: There is a optimal opacification of the pulmonary arteries. There is no central,segmental, or subsegmental filling defects within the pulmonary arteries. The heart is normal in size. No pericardial effusion or thickening. No evidence right heart strain. There is normal three-vessel brachiocephalic anatomy without proximal stenosis. The thoracic aorta is normal in appearance. Mediastinum/Nodes: No hilar, mediastinal, or axillary adenopathy. Thyroid gland, trachea, and esophagus demonstrate no significant findings. Lungs/Pleura: Multifocal patchy ground-glass opacities are seen predominantly within the periphery of both lungs. No pleural effusion or  pneumothorax is seen. Upper Abdomen: No acute abnormalities present in the visualized portions of the upper abdomen. Musculoskeletal: No chest wall abnormality. No acute or significant osseous findings. Review of the MIP images confirms the above findings. Abdomen/pelvis: Hepatobiliary: There is diffuse low density seen throughout the liver parenchyma. The main portal vein is patent. No evidence of calcified gallstones, gallbladder wall thickening or biliary dilatation. Pancreas: Unremarkable. No pancreatic ductal dilatation or surrounding inflammatory changes. Spleen: Normal in size without focal abnormality. Adrenals/Urinary Tract: Both adrenal glands appear normal. The kidneys and collecting system appear normal without evidence of urinary tract calculus or hydronephrosis. Bladder is unremarkable. Stomach/Bowel: The stomach, small bowel, and colon are normal in appearance. No inflammatory changes, wall thickening, or obstructive findings. There appears to be a surgical anastomosis at the sigmoid rectal junction. Mild presacral soft tissue density is again identified. Vascular/Lymphatic: There are no enlarged mesenteric, retroperitoneal, or pelvic lymph nodes. An infrarenal IVC filter is seen at approximately the L3 level. There is scattered aortic and bi-iliac calcifications noted. Reproductive: The uterus and adnexa are unremarkable. Other: No evidence of abdominal wall mass or hernia. Musculoskeletal: No acute or significant osseous findings. IMPRESSION: No central, segmental, or subsegmental pulmonary embolism. Multifocal patchy airspace opacities throughout both lungs, likely consistent with multifocal pneumonia. Hepatic steatosis Aortic Atherosclerosis (ICD10-I70.0). Electronically Signed   By: Prudencio Pair M.D.   On: 03/22/2020 00:07   MR THORACIC SPINE W WO CONTRAST  Result Date: 03/22/2020 CLINICAL DATA:  Bilateral lower extremity pain and numbness with urinary retention. Diabetes. History of colon  cancer. EXAM: MRI THORACIC WITHOUT AND WITH CONTRAST TECHNIQUE: Multiplanar and multiecho pulse sequences of the thoracic spine were obtained without and with intravenous contrast. CONTRAST:  9.74mL GADAVIST GADOBUTROL 1 MMOL/ML IV SOLN COMPARISON:  CT chest done yesterday.  FINDINGS: MRI THORACIC SPINE FINDINGS Alignment:  Normal Vertebrae: Normal. No fracture or primary bone lesion. No metastatic disease. Insignificant small hemangioma within the T11 vertebral body. Cord: No primary cord pathology. See below regarding stenosis at T7-8. Paraspinal and other soft tissues: Negative Disc levels: No abnormality at T5-6 or above. T6-7: Small right paracentral disc protrusion indents the thecal sac slightly but does not cause apparent neural compression. T7-8: Left posterolateral disc herniation effaces the ventral subarachnoid space and indents the left side of the cord. This could be symptomatic. T8-9 through T12-L1: Normal. 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 Ample subarachnoid space is present dorsal to the cord at this level. None the less, this could possibly be symptomatic. Small right paracentral disc herniation at T6-7 without apparent neural compression. Electronically Signed   By: Nelson Chimes M.D.   On: 03/22/2020 12:32   MR Lumbar Spine W Wo Contrast  Result Date: 03/22/2020 CLINICAL DATA:  Back pain.  Urinary retention.  Diabetes. EXAM: MRI LUMBAR SPINE WITHOUT AND WITH CONTRAST TECHNIQUE: Multiplanar and multiecho pulse sequences of the lumbar spine were obtained without and with intravenous contrast. CONTRAST:  9.14mL GADAVIST GADOBUTROL 1 MMOL/ML IV SOLN COMPARISON:  None. FINDINGS: Segmentation:  5 lumbar type vertebral bodies. Alignment:  Slightly exaggerated lower lumbar lordosis. Vertebrae:  No fracture or primary bone lesion. Conus medullaris and cauda equina: Conus extends to  the L1 level. Conus and cauda equina appear normal. Paraspinal and other soft tissues: There appears be thrombosis of the IVC and both iliac veins up to the level of the IVC filter. Flow is restored in the IVC by inflow from the renal veins. Disc levels: No abnormality from L1-2 through L2-3. L3-4: No disc abnormality.  Mild facet hypertrophy.  No stenosis. L4-5: No disc abnormality. Mild to moderate facet hypertrophy. No stenosis. L5-S1: Minimal disc bulge.  Mild facet hypertrophy.  No stenosis. IMPRESSION: No significant disc pathology. No stenosis or neural compression in the lumbar region. Patient has exaggerated lumbar lordosis. 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 Flow is restored in the IVC above that by inflow from the renal veins. I am in the process of calling this report. Electronically Signed   By: Nelson Chimes M.D.   On: 03/22/2020 12:46   CT ABDOMEN PELVIS W CONTRAST  Result Date: 03/22/2020 CLINICAL DATA:  Severe back pain hypotension and shallow EXAM: CT ANGIOGRAPHY CHEST WITH CONTRAST TECHNIQUE: Multidetector CT imaging of the chest was performed using the standard protocol during bolus administration of intravenous contrast. Multiplanar CT image reconstructions and MIPs were obtained to evaluate the vascular anatomy. CONTRAST:  42mL OMNIPAQUE IOHEXOL 350 MG/ML SOLN COMPARISON:  None. FINDINGS: Cardiovascular: There is a optimal opacification of the pulmonary arteries. There is no central,segmental, or subsegmental filling defects within the pulmonary arteries. The heart is normal in size. No pericardial effusion or thickening. No evidence right heart strain. There is normal three-vessel brachiocephalic anatomy without proximal stenosis. The thoracic aorta is normal in appearance. Mediastinum/Nodes: No  hilar, mediastinal, or axillary adenopathy. Thyroid gland, trachea, and esophagus demonstrate no significant findings. Lungs/Pleura: Multifocal patchy ground-glass opacities are seen predominantly within the periphery of both lungs. No pleural effusion or  pneumothorax is seen. Upper Abdomen: No acute abnormalities present in the visualized portions of the upper abdomen. Musculoskeletal: No chest wall abnormality. No acute or significant osseous findings. Review of the MIP images confirms the above findings. Abdomen/pelvis: Hepatobiliary: There is diffuse low density seen throughout the liver parenchyma. The main portal vein is patent. No evidence of calcified gallstones, gallbladder wall thickening or biliary dilatation. Pancreas: Unremarkable. No pancreatic ductal dilatation or surrounding inflammatory changes. Spleen: Normal in size without focal abnormality. Adrenals/Urinary Tract: Both adrenal glands appear normal. The kidneys and collecting system appear normal without evidence of urinary tract calculus or hydronephrosis. Bladder is unremarkable. Stomach/Bowel: The stomach, small bowel, and colon are normal in appearance. No inflammatory changes, wall thickening, or obstructive findings. There appears to be a surgical anastomosis at the sigmoid rectal junction. Mild presacral soft tissue density is again identified. Vascular/Lymphatic: There are no enlarged mesenteric, retroperitoneal, or pelvic lymph nodes. An infrarenal IVC filter is seen at approximately the L3 level. There is scattered aortic and bi-iliac calcifications noted. Reproductive: The uterus and adnexa are unremarkable. Other: No evidence of abdominal wall mass or hernia. Musculoskeletal: No acute or significant osseous findings. IMPRESSION: No central, segmental, or subsegmental pulmonary embolism. Multifocal patchy airspace opacities throughout both lungs, likely consistent with multifocal pneumonia. Hepatic steatosis Aortic Atherosclerosis  (ICD10-I70.0). Electronically Signed   By: Prudencio Pair M.D.   On: 03/22/2020 00:07   DG Chest Port 1 View  Result Date: 03/21/2020 CLINICAL DATA:  54 year old female with weakness and hypotension. EXAM: PORTABLE CHEST 1 VIEW COMPARISON:  Chest radiograph dated 03/25/2016 FINDINGS: Faint bilateral peripheral and subpleural densities, right greater left concerning for pneumonia, possibly atypical or viral in etiology including COVID-19. Clinical correlation is recommended. No focal consolidation, pleural effusion, pneumothorax. The cardiac silhouette is within limits. No acute osseous pathology. IMPRESSION: Findings concerning for pneumonia. Electronically Signed   By: Anner Crete M.D.   On: 03/21/2020 22:17   VAS [place] LOWER EXTREMITY VENOUS (DVT)  Result Date: 03/22/2020  Lower Venous DVTStudy Indications: Hx DVT and IVC filter, now with COVID and elevated D dimer.  Comparison Study: 06/07/15 negative Performing Technologist: June Leap RDMS, RVT  Examination Guidelines: A complete evaluation includes B-mode imaging, spectral Doppler, color Doppler, and power Doppler as needed of all accessible portions of each vessel. Bilateral testing is considered an integral part of a complete examination. Limited examinations for reoccurring indications may be performed as noted. The reflux portion of the exam is performed with the patient in reverse Trendelenburg.  +---------+---------------+---------+-----------+----------+-----------------+ RIGHT    CompressibilityPhasicitySpontaneityPropertiesThrombus Aging    +---------+---------------+---------+-----------+----------+-----------------+ CFV      None           No       No                   Acute             +---------+---------------+---------+-----------+----------+-----------------+ SFJ      None                                         Acute             +---------+---------------+---------+-----------+----------+-----------------+ FV Prox   None  Acute             +---------+---------------+---------+-----------+----------+-----------------+ FV Mid   None                                         Acute             +---------+---------------+---------+-----------+----------+-----------------+ FV DistalNone                                         Acute             +---------+---------------+---------+-----------+----------+-----------------+ PFV      None                                         Acute             +---------+---------------+---------+-----------+----------+-----------------+ POP      None           No       No                   Acute             +---------+---------------+---------+-----------+----------+-----------------+ PTV      None                                         Acute             +---------+---------------+---------+-----------+----------+-----------------+ PERO     None                                         Acute             +---------+---------------+---------+-----------+----------+-----------------+ Gastroc  None                                         Acute             +---------+---------------+---------+-----------+----------+-----------------+ GSV      None                                         Acute             +---------+---------------+---------+-----------+----------+-----------------+ Ex Iliac                No       No                   Age Indeterminate +---------+---------------+---------+-----------+----------+-----------------+ Com Iliac                                             Not visualized    +---------+---------------+---------+-----------+----------+-----------------+   +---------+---------------+---------+-----------+----------+-----------------+ LEFT     CompressibilityPhasicitySpontaneityPropertiesThrombus Aging     +---------+---------------+---------+-----------+----------+-----------------+ CFV  None           No       No                   Acute             +---------+---------------+---------+-----------+----------+-----------------+ SFJ      None                                         Acute             +---------+---------------+---------+-----------+----------+-----------------+ FV Prox  None                                         Acute             +---------+---------------+---------+-----------+----------+-----------------+ FV Mid   None                                         Acute             +---------+---------------+---------+-----------+----------+-----------------+ FV DistalNone                                         Acute             +---------+---------------+---------+-----------+----------+-----------------+ PFV      None                                         Acute             +---------+---------------+---------+-----------+----------+-----------------+ POP      None           No       No                   Acute             +---------+---------------+---------+-----------+----------+-----------------+ PTV      None                                         Acute             +---------+---------------+---------+-----------+----------+-----------------+ PERO     None                                         Acute             +---------+---------------+---------+-----------+----------+-----------------+ Gastroc  None                                         Acute             +---------+---------------+---------+-----------+----------+-----------------+ GSV      None  Acute             +---------+---------------+---------+-----------+----------+-----------------+ Ex Iliac                No       No                   Age Indeterminate  +---------+---------------+---------+-----------+----------+-----------------+ Com Iliac                                             Not visualized    +---------+---------------+---------+-----------+----------+-----------------+     Summary: RIGHT: - Findings consistent with acute deep vein thrombosis involving the right common femoral vein, SF junction, right femoral vein, right proximal profunda vein, right popliteal vein, right posterior tibial veins, right peroneal veins, and right gastrocnemius veins. - Findings consistent with acute superficial vein thrombosis involving the right great saphenous vein. - Thrombosis extends into the Right Iliac vein and IVC up to the level of the filter placement.  LEFT: - Findings consistent with acute deep vein thrombosis involving the left common femoral vein, SF junction, left femoral vein, left proximal profunda vein, left popliteal vein, left posterior tibial veins, left peroneal veins, and left gastrocnemius veins. - Findings consistent with acute superficial vein thrombosis involving the left great saphenous vein. - Thrombosis extends into the Left Iliac vein.  *See table(s) above for measurements and observations. Electronically signed by Curt Jews MD on 03/22/2020 at 5:19:17 PM.    Final      Phillips Climes M.D on 03/27/2020 at 11:35 AM    [facility] -  Office  [phone]

## 2020-03-27 NOTE — Progress Notes (Signed)
ANTICOAGULATION CONSULT NOTE - Follow Up Consult  Pharmacy Consult for Heparin Indication: h/o PE, now BL DVTs  Allergies  Allergen Reactions  . Metformin And Related Other (See Comments)    Chest pain  . Penicillins Hives    Patient Measurements: Height: 5\' 11"  (180.3 cm) Weight: 94.2 kg (207 lb 10.8 oz) IBW/kg (Calculated) : 70.8 Heparin Dosing Weight:    Vital Signs: Temp: 98.4 F (36.9 C) (06/07 1224) Temp Source: Oral (06/07 1224) BP: 101/60 (06/07 1224) Pulse Rate: 63 (06/07 1224)  Labs: Recent Labs    03/25/20 0506 03/25/20 0506 03/26/20 0537 03/27/20 1136  HGB 11.3*   < > 11.2* 11.5*  HCT 35.5*  --  36.0 36.1  PLT 161  --  183 167  HEPARINUNFRC 0.48  --  0.39 0.52  CREATININE 0.79  --  0.95 0.92   < > = values in this interval not displayed.    Estimated Creatinine Clearance: 89.5 mL/min (by C-G formula based on SCr of 0.92 mg/dL).   Assessment:  Anticoag: Heparin for h/o DVT x2, d-dimer down to 14.5,  CTA negative for PE  but IVC filter clotted and venous Doppler showing extensive DVT in the bilateral lower extremities  - HL 0.52. Hgb 11.5 stable. Plts 167 stable.   Goal of Therapy:  Heparin level 0.3-0.7 units/ml Monitor platelets by anticoagulation protocol: Yes   Plan:  Continue heparin gtt at 1000 units/hr Daily heparin level and CBC   [place]ari[place]ula Krist S. Alford Highland, PharmD, BCPS Clinical Staff Pharmacist [email or web address] Alford Highland, [employer] 03/27/2020,1:49 PM

## 2020-03-27 NOTE — H&P (View-Only) (Signed)
  Progress Note    03/27/2020 6:17 PM   Subjective:  Legs feeling better  Vitals:   03/27/20 1600 03/27/20 1601  BP: 110/67 110/67  Pulse: 67 70  Resp: (!) 29 17  Temp: 98.5 F (36.9 C) 98.5 F (36.9 C)  SpO2: 90% 92%    Physical Exam: Awake and oriented Nonlabored respirations Legs compartments are softer and feet are warm  CBC    Component Value Date/Time   WBC 8.2 03/27/2020 1136   RBC 3.88 03/27/2020 1136   HGB 11.5 (L) 03/27/2020 1136   HGB 13.3 12/17/2013 1538   HCT 36.1 03/27/2020 1136   HCT 40.6 12/17/2013 1538   PLT 167 03/27/2020 1136   PLT 219 12/17/2013 1538   MCV 93.0 03/27/2020 1136   MCV 90.3 12/17/2013 1538   MCH 29.6 03/27/2020 1136   MCHC 31.9 03/27/2020 1136   RDW 14.4 03/27/2020 1136   RDW 13.5 12/17/2013 1538   LYMPHSABS 1.4 03/26/2020 0537   LYMPHSABS 1.4 12/17/2013 1538   MONOABS 0.5 03/26/2020 0537   MONOABS 0.3 12/17/2013 1538   EOSABS 0.0 03/26/2020 0537   EOSABS 0.0 12/17/2013 1538   BASOSABS 0.0 03/26/2020 0537   BASOSABS 0.0 12/17/2013 1538    BMET    Component Value Date/Time   NA 135 03/27/2020 1136   NA 144 01/21/2018 1000   NA 142 12/17/2013 1538   K 4.1 03/27/2020 1136   K 4.1 12/17/2013 1538   CL 102 03/27/2020 1136   CL 106 12/07/2012 0838   CO2 26 03/27/2020 1136   CO2 28 12/17/2013 1538   GLUCOSE 214 (H) 03/27/2020 1136   GLUCOSE 123 12/17/2013 1538   GLUCOSE 119 (H) 12/07/2012 0838   BUN 26 (H) 03/27/2020 1136   BUN 12 01/21/2018 1000   BUN 14.1 12/17/2013 1538   CREATININE 0.92 03/27/2020 1136   CREATININE 0.96 10/27/2015 0904   CREATININE 0.8 12/17/2013 1538   CALCIUM 9.1 03/27/2020 1136   CALCIUM 9.7 12/17/2013 1538   GFRNONAA >60 03/27/2020 1136   GFRAA >60 03/27/2020 1136    INR    Component Value Date/Time   INR 1.5 (H) 03/21/2020 2143   INR 2.10 12/07/2012 1025     Intake/Output Summary (Last 24 hours) at 03/27/2020 1817 Last data filed at 03/27/2020 1000 Gross per 24 hour  Intake 483.63  ml  Output 875 ml  Net -391.37 ml     Assessment/plan:  54 y.o. female is here with occluded IVC filter lower extremity DVT.  Plan will be for lytic catheter placement tomorrow and angio VAC is mechanical thrombectomy subsequently on Wednesday.  I have again discussed the risk and benefits with patient and her husband via FaceTime.    Teige Rountree C. Donzetta Matters, MD [facility] [place] Office: [phone] Pager: [phone]  03/27/2020 6:17 PM

## 2020-03-27 NOTE — Progress Notes (Signed)
°  Progress Note    03/27/2020 6:17 PM   Subjective:  Legs feeling better  Vitals:   03/27/20 1600 03/27/20 1601  BP: 110/67 110/67  Pulse: 67 70  Resp: (!) 29 17  Temp: 98.5 F (36.9 C) 98.5 F (36.9 C)  SpO2: 90% 92%    Physical Exam: Awake and oriented Nonlabored respirations Legs compartments are softer and feet are warm  CBC    Component Value Date/Time   WBC 8.2 03/27/2020 1136   RBC 3.88 03/27/2020 1136   HGB 11.5 (L) 03/27/2020 1136   HGB 13.3 12/17/2013 1538   HCT 36.1 03/27/2020 1136   HCT 40.6 12/17/2013 1538   PLT 167 03/27/2020 1136   PLT 219 12/17/2013 1538   MCV 93.0 03/27/2020 1136   MCV 90.3 12/17/2013 1538   MCH 29.6 03/27/2020 1136   MCHC 31.9 03/27/2020 1136   RDW 14.4 03/27/2020 1136   RDW 13.5 12/17/2013 1538   LYMPHSABS 1.4 03/26/2020 0537   LYMPHSABS 1.4 12/17/2013 1538   MONOABS 0.5 03/26/2020 0537   MONOABS 0.3 12/17/2013 1538   EOSABS 0.0 03/26/2020 0537   EOSABS 0.0 12/17/2013 1538   BASOSABS 0.0 03/26/2020 0537   BASOSABS 0.0 12/17/2013 1538    BMET    Component Value Date/Time   NA 135 03/27/2020 1136   NA 144 01/21/2018 1000   NA 142 12/17/2013 1538   K 4.1 03/27/2020 1136   K 4.1 12/17/2013 1538   CL 102 03/27/2020 1136   CL 106 12/07/2012 0838   CO2 26 03/27/2020 1136   CO2 28 12/17/2013 1538   GLUCOSE 214 (H) 03/27/2020 1136   GLUCOSE 123 12/17/2013 1538   GLUCOSE 119 (H) 12/07/2012 0838   BUN 26 (H) 03/27/2020 1136   BUN 12 01/21/2018 1000   BUN 14.1 12/17/2013 1538   CREATININE 0.92 03/27/2020 1136   CREATININE 0.96 10/27/2015 0904   CREATININE 0.8 12/17/2013 1538   CALCIUM 9.1 03/27/2020 1136   CALCIUM 9.7 12/17/2013 1538   GFRNONAA >60 03/27/2020 1136   GFRAA >60 03/27/2020 1136    INR    Component Value Date/Time   INR 1.5 (H) 03/21/2020 2143   INR 2.10 12/07/2012 1025     Intake/Output Summary (Last 24 hours) at 03/27/2020 1817 Last data filed at 03/27/2020 1000 Gross per 24 hour  Intake 483.63  ml  Output 875 ml  Net -391.37 ml     Assessment/plan:  54 y.o. female is here with occluded IVC filter lower extremity DVT.  Plan will be for lytic catheter placement tomorrow and angio VAC is mechanical thrombectomy subsequently on Wednesday.  I have again discussed the risk and benefits with patient and her husband via FaceTime.    [name] C. Donzetta Matters, MD [facility] [place] Office: [phone] Pager: [phone]  03/27/2020 6:17 PM

## 2020-03-27 NOTE — Progress Notes (Signed)
Inpatient Diabetes Program Recommendations  AACE/ADA: New Consensus Statement on Inpatient Glycemic Control   Target Ranges:  Prepandial:   less than 140 mg/dL      Peak postprandial:   less than 180 mg/dL (1-2 hours)      Critically ill patients:  140 - 180 mg/dL   Results for HANSINI, CLODFELTER (MRN [id number]) as of 03/27/2020 12:18  Ref. Range 03/26/2020 07:45 03/26/2020 12:15 03/26/2020 16:21 03/26/2020 21:05 03/27/2020 07:48  Glucose-Capillary Latest Ref Range: 70 - 99 mg/dL 175 (H) 180 (H) 217 (H) 245 (H) 166 (H)   Review of Glycemic Control  Current orders for Inpatient glycemic control: Lantus 16 units daily, Novolog 4 units TID with meals for meal coverage, Novolog 0-15 units TID with meals, Novolog 0-5 units QHS, Tradjenta 5 mg daily; Decadron 4 mg daily  Inpatient Diabetes Program Recommendations:    Insulin-Meal Coverage: If steroids are continued as ordered, please consider increasing meal coverage to Novolog 6 units TID with meals.  Thanks, Barnie Alderman, RN, MSN, CDE Diabetes Coordinator Inpatient Diabetes Program [phone] (Team Pager from 8am to 5pm)

## 2020-03-28 ENCOUNTER — Encounter (HOSPITAL_COMMUNITY): Payer: Self-pay | Admitting: Internal Medicine

## 2020-03-28 ENCOUNTER — Encounter (HOSPITAL_COMMUNITY)
Admission: EM | Disposition: A | Discharge status: Home Health | Payer: Self-pay | Source: Home / Self Care | Attending: Internal Medicine

## 2020-03-28 ENCOUNTER — Inpatient Hospital Stay (HOSPITAL_COMMUNITY): Payer: No Typology Code available for payment source | Admitting: Certified Registered Nurse Anesthetist

## 2020-03-28 ENCOUNTER — Inpatient Hospital Stay (HOSPITAL_COMMUNITY): Payer: No Typology Code available for payment source

## 2020-03-28 LAB — COMPREHENSIVE METABOLIC PANEL
ALT: 28 U/L (ref 0–44)
AST: 18 U/L (ref 15–41)
Albumin: 2.9 g/dL — ABNORMAL LOW (ref 3.5–5.0)
Alkaline Phosphatase: 68 U/L (ref 38–126)
Anion gap: 9 (ref 5–15)
BUN: 27 mg/dL — ABNORMAL HIGH (ref 6–20)
CO2: 25 mmol/L (ref 22–32)
Calcium: 9.1 mg/dL (ref 8.9–10.3)
Chloride: 103 mmol/L (ref 98–111)
Creatinine, Ser: 0.89 mg/dL (ref 0.44–1.00)
GFR calc Af Amer: >60 mL/min (ref >60)
GFR calc non Af Amer: >60 mL/min (ref >60)
Glucose, Bld: 164 mg/dL — ABNORMAL HIGH (ref 70–99)
Potassium: 4.4 mmol/L (ref 3.5–5.1)
Sodium: 137 mmol/L (ref 135–145)
Total Bilirubin: 0.7 mg/dL (ref 0.3–1.2)
Total Protein: 6.2 g/dL — ABNORMAL LOW (ref 6.5–8.1)

## 2020-03-28 LAB — C-REACTIVE PROTEIN: CRP: 0.6 mg/dL (ref <1.0)

## 2020-03-28 LAB — CBC
HCT: 35.6 % — ABNORMAL LOW (ref 36.0–46.0)
Hemoglobin: 11.2 g/dL — ABNORMAL LOW (ref 12.0–15.0)
MCH: 29.4 pg (ref 26.0–34.0)
MCHC: 31.5 g/dL (ref 30.0–36.0)
MCV: 93.4 fL (ref 80.0–100.0)
Platelets: 177 10*3/uL (ref 150–400)
RBC: 3.81 MIL/uL — ABNORMAL LOW (ref 3.87–5.11)
RDW: 14.4 % (ref 11.5–15.5)
WBC: 7.8 10*3/uL (ref 4.0–10.5)
nRBC: 0.4 % — ABNORMAL HIGH (ref 0.0–0.2)

## 2020-03-28 LAB — HEPARIN LEVEL (UNFRACTIONATED)
Heparin Unfractionated: 0.76 IU/mL — ABNORMAL HIGH (ref 0.30–0.70)
Heparin Unfractionated: 0.38 IU/mL (ref 0.30–0.70)

## 2020-03-28 LAB — GLUCOSE, CAPILLARY
Glucose-Capillary: 118 mg/dL — ABNORMAL HIGH (ref 70–99)
Glucose-Capillary: 106 mg/dL — ABNORMAL HIGH (ref 70–99)
Glucose-Capillary: 111 mg/dL — ABNORMAL HIGH (ref 70–99)
Glucose-Capillary: 134 mg/dL — ABNORMAL HIGH (ref 70–99)
Glucose-Capillary: 135 mg/dL — ABNORMAL HIGH (ref 70–99)

## 2020-03-28 SURGERY — INSERTION, CATHETER, FOR THROMBOLYSIS
Anesthesia: Monitor Anesthesia Care | Site: Leg Lower | Laterality: Bilateral

## 2020-03-28 SURGERY — PERIPHERAL VASCULAR THROMBECTOMY
Anesthesia: LOCAL

## 2020-03-28 MED ORDER — NON FORMULARY
Status: DC | PRN
  Administered 2020-03-28: .02 mg

## 2020-03-28 MED ORDER — LIDOCAINE 2% (20 MG/ML) 5 ML SYRINGE
INTRAMUSCULAR | Status: AC
  Filled 2020-03-28: qty 5

## 2020-03-28 MED ORDER — FENTANYL CITRATE (PF) 250 MCG/5ML IJ SOLN
INTRAMUSCULAR | Status: AC
  Filled 2020-03-28: qty 5

## 2020-03-28 MED ORDER — 0.9 % SODIUM CHLORIDE (POUR BTL) OPTIME
TOPICAL | Status: DC | PRN
  Administered 2020-03-28: 1000 mL

## 2020-03-28 MED ORDER — PROPOFOL 10 MG/ML IV BOLUS
INTRAVENOUS | Status: AC
  Filled 2020-03-28: qty 20

## 2020-03-28 MED ORDER — SUCCINYLCHOLINE CHLORIDE 200 MG/10ML IV SOSY
PREFILLED_SYRINGE | INTRAVENOUS | Status: AC
  Filled 2020-03-28: qty 10

## 2020-03-28 MED ORDER — MIDAZOLAM HCL 2 MG/2ML IJ SOLN
INTRAMUSCULAR | Status: AC
  Filled 2020-03-28: qty 2

## 2020-03-28 MED ORDER — ROCURONIUM BROMIDE 10 MG/ML (PF) SYRINGE
PREFILLED_SYRINGE | INTRAVENOUS | Status: AC
  Filled 2020-03-28: qty 10

## 2020-03-28 MED ORDER — MIDAZOLAM HCL 5 MG/5ML IJ SOLN
INTRAMUSCULAR | Status: DC | PRN
  Administered 2020-03-28 (×2): 1 mg via INTRAVENOUS

## 2020-03-28 MED ORDER — FENTANYL CITRATE (PF) 100 MCG/2ML IJ SOLN
INTRAMUSCULAR | Status: DC | PRN
  Administered 2020-03-28 (×3): 50 ug via INTRAVENOUS

## 2020-03-28 MED ORDER — HEPARIN (PORCINE) 25000 UT/250ML-% IV SOLN
800.0000 [IU]/h | INTRAVENOUS | Status: DC
  Administered 2020-03-28: 800 [IU]/h via INTRAVENOUS

## 2020-03-28 MED ORDER — DEXAMETHASONE SODIUM PHOSPHATE 10 MG/ML IJ SOLN
INTRAMUSCULAR | Status: AC
  Filled 2020-03-28: qty 2

## 2020-03-28 MED ORDER — EPHEDRINE 5 MG/ML INJ
INTRAVENOUS | Status: AC
  Filled 2020-03-28: qty 10

## 2020-03-28 MED ORDER — SODIUM CHLORIDE 0.9 % IV SOLN
0.2500 mg/h | INTRAVENOUS | Status: DC
  Administered 2020-03-28: .25 mg/h
  Filled 2020-03-28: qty 10

## 2020-03-28 MED ORDER — SODIUM CHLORIDE 0.9 % IV SOLN
INTRAVENOUS | Status: DC | PRN
  Administered 2020-03-28: 500 mL

## 2020-03-28 MED ORDER — LIDOCAINE 1 % OPTIME INJ - NO CHARGE
INTRAMUSCULAR | Status: DC | PRN
  Administered 2020-03-28: 12 mL

## 2020-03-28 MED ORDER — MORPHINE SULFATE (PF) 4 MG/ML IV SOLN: 5.0000 mg | INTRAVENOUS | Status: DC | PRN

## 2020-03-28 MED ORDER — SODIUM CHLORIDE 0.9 % IV SOLN
250.0000 mL | INTRAVENOUS | Status: DC | PRN
  Administered 2020-03-29: 250 mL via INTRAVENOUS

## 2020-03-28 MED ORDER — ONDANSETRON HCL 4 MG/2ML IJ SOLN
INTRAMUSCULAR | Status: AC
  Filled 2020-03-28: qty 2

## 2020-03-28 MED ORDER — SODIUM CHLORIDE 0.9% FLUSH
3.0000 mL | Freq: Two times a day (BID) | INTRAVENOUS | Status: DC
  Administered 2020-03-28: 3 mL via INTRAVENOUS

## 2020-03-28 MED ORDER — PHENYLEPHRINE 40 MCG/ML (10ML) SYRINGE FOR IV PUSH (FOR BLOOD PRESSURE SUPPORT)
PREFILLED_SYRINGE | INTRAVENOUS | Status: AC
  Filled 2020-03-28: qty 10

## 2020-03-28 MED ORDER — LIDOCAINE HCL (PF) 1 % IJ SOLN
INTRAMUSCULAR | Status: AC
  Filled 2020-03-28: qty 30

## 2020-03-28 MED ORDER — DEXTROSE-NACL 5-0.45 % IV SOLN: INTRAVENOUS | Status: DC

## 2020-03-28 MED ORDER — INSULIN GLARGINE 100 UNIT/ML ~~LOC~~ SOLN
10.0000 [IU] | Freq: Every day | SUBCUTANEOUS | Status: DC
  Administered 2020-03-29 – 2020-04-02 (×5): 10 [IU] via SUBCUTANEOUS
  Filled 2020-03-28 (×6): qty 0.1

## 2020-03-28 MED ORDER — SODIUM CHLORIDE (PF) 0.9 % IJ SOLN: INTRAVENOUS | Status: DC | PRN

## 2020-03-28 MED ORDER — LACTATED RINGERS IV SOLN: INTRAVENOUS | Status: DC | PRN

## 2020-03-28 MED ORDER — SODIUM CHLORIDE 0.9% FLUSH: 3.0000 mL | INTRAVENOUS | Status: DC | PRN

## 2020-03-28 MED ORDER — SODIUM CHLORIDE 0.9 % IV SOLN
INTRAVENOUS | Status: AC
  Administered 2020-03-30: 1000 mL via INTRAVENOUS

## 2020-03-28 SURGICAL SUPPLY — 49 items
BAG DECANTER FOR FLEXI CONT (MISCELLANEOUS) ×3 IMPLANT
BAG SNAP BAND KOVER 36X36 (MISCELLANEOUS) ×3 IMPLANT
CANISTER SUCT 3000ML PPV (MISCELLANEOUS) ×3 IMPLANT
CATH ANGIO 5F BER2 100CM (CATHETERS) ×3 IMPLANT
CATH BEACON 5 .035 65 KMP TIP (CATHETERS) IMPLANT
CATH INFUS 135CMX50CM (CATHETERS) ×6 IMPLANT
CATH VISIONS PV .035 IVUS (CATHETERS) ×3 IMPLANT
CLOSURE WOUND 1/2 X4 (GAUZE/BANDAGES/DRESSINGS) ×2
COVER DOME SNAP 22 D (MISCELLANEOUS) ×3 IMPLANT
COVER MAYO STAND STRL (DRAPES) IMPLANT
COVER WAND RF STERILE (DRAPES) ×3 IMPLANT
DECANTER SPIKE VIAL GLASS SM (MISCELLANEOUS) ×3 IMPLANT
DEVICE TORQUE H2O (MISCELLANEOUS) IMPLANT
DRSG OPSITE POSTOP 4X6 (GAUZE/BANDAGES/DRESSINGS) ×6 IMPLANT
ELECT CAUTERY BLADE 6.4 (BLADE) ×3 IMPLANT
GAUZE 4X4 16PLY RFD (DISPOSABLE) IMPLANT
GLIDEWIRE ANGLED SS 035X260CM (WIRE) IMPLANT
GLOVE BIOGEL PI IND STRL 7.0 (GLOVE) ×1 IMPLANT
GLOVE BIOGEL PI IND STRL 7.5 (GLOVE) ×1 IMPLANT
GLOVE BIOGEL PI INDICATOR 7.0 (GLOVE) ×2
GLOVE BIOGEL PI INDICATOR 7.5 (GLOVE) ×2
GLOVE INDICATOR 7.5 STRL GRN (GLOVE) ×3 IMPLANT
GLOVE SURG SS PI 7.5 STRL IVOR (GLOVE) ×3 IMPLANT
GOWN STRL REUS W/ TWL LRG LVL3 (GOWN DISPOSABLE) ×2 IMPLANT
GOWN STRL REUS W/ TWL XL LVL3 (GOWN DISPOSABLE) ×1 IMPLANT
GOWN STRL REUS W/TWL LRG LVL3 (GOWN DISPOSABLE) ×6
GOWN STRL REUS W/TWL XL LVL3 (GOWN DISPOSABLE) ×3
GUIDEWIRE ANGLED .035X150CM (WIRE) IMPLANT
KIT BASIN OR (CUSTOM PROCEDURE TRAY) ×3 IMPLANT
NEEDLE HYPO 25GX1X1/2 BEV (NEEDLE) IMPLANT
NS IRRIG 1000ML POUR BTL (IV SOLUTION) ×3 IMPLANT
PACK ENDO MINOR (CUSTOM PROCEDURE TRAY) ×3 IMPLANT
PAD ARMBOARD 7.5X6 YLW CONV (MISCELLANEOUS) ×6 IMPLANT
PENCIL BUTTON HOLSTER BLD 10FT (ELECTRODE) ×3 IMPLANT
SET MICROPUNCTURE 5F STIFF (MISCELLANEOUS) ×9 IMPLANT
SHEATH BRITE TIP 8FR 35CM (SHEATH) ×6 IMPLANT
SHEATH PINNACLE 5F 10CM (SHEATH) IMPLANT
SHEATH PINNACLE 8F 10CM (SHEATH) IMPLANT
SPONGE LAP 18X18 RF (DISPOSABLE) ×3 IMPLANT
STRIP CLOSURE SKIN 1/2X4 (GAUZE/BANDAGES/DRESSINGS) ×4 IMPLANT
SUT SILK 2 0 SH (SUTURE) ×6 IMPLANT
SYR CONTROL 10ML LL (SYRINGE) IMPLANT
TOWEL GREEN STERILE (TOWEL DISPOSABLE) ×9 IMPLANT
TOWEL GREEN STERILE FF (TOWEL DISPOSABLE) ×3 IMPLANT
TUBE CONNECTING 12'X1/4 (SUCTIONS) ×1
TUBE CONNECTING 12X1/4 (SUCTIONS) ×2 IMPLANT
WATER STERILE IRR 1000ML POUR (IV SOLUTION) ×3 IMPLANT
WIRE AMPLATZ SS-J .035X260CM (WIRE) ×6 IMPLANT
WIRE BENTSON .035X145CM (WIRE) ×6 IMPLANT

## 2020-03-28 NOTE — Progress Notes (Signed)
ANTICOAGULATION CONSULT NOTE - Follow Up Consult  Pharmacy Consult for Heparin Indication: Clotted IVC filter and BL DVT's  Allergies  Allergen Reactions  . Metformin And Related Other (See Comments)    Chest pain  . Penicillins Hives    Patient Measurements: Height: 5\' 11"  (180.3 cm) Weight: 94.2 kg (207 lb 10.8 oz) IBW/kg (Calculated) : 70.8 Heparin Dosing Weight: 90.2 kg  Vital Signs: Temp: 97.6 F (36.4 C) (06/08 2045) Temp Source: Oral (06/08 1546) BP: 96/80 (06/08 2045) Pulse Rate: 59 (06/08 2045)  Labs: Recent Labs    03/26/20 0537 03/26/20 0537 03/27/20 1136 03/28/20 0517 03/28/20 1357  HGB 11.2*   < > 11.5* 11.2*  --   HCT 36.0  --  36.1 35.6*  --   PLT 183  --  167 177  --   HEPARINUNFRC 0.39   < > 0.52 0.76* 0.38  CREATININE 0.95  --  0.92 0.89  --    < > = values in this interval not displayed.    Estimated Creatinine Clearance: 92.6 mL/min (by C-G formula based on SCr of 0.89 mg/dL).  Assessment: 55 yof presenting with occluded IVC filter, extensive DVT of bilateral lower extremities. Now s/p lytic catheter placement 6/8 with plan for angio [facility] mechanical thrombectomy on 6/9. Patient continuing on heparin post-procedure, now targeting lower heparin level goal 0.2-0.5 with patient continuing on alteplase running intra-catheter bilaterally at 0.25 mg/hr each.  Previous heparin level therapeutic at 0.38 on rate of 900 units/hr. Vascular reduced rate to 800 units/hr post-op. CBC stable. No bleeding or issues with infusion per discussion with RN.  Goal of Therapy:  Heparin level 0.2-0.5 units/ml post-op on lytic therapy Monitor platelets by anticoagulation protocol: Yes   Plan:  Reduce heparin to 800 units/hr post-op per Vascular Continue alteplase per Vascular, infusing bilaterally intra-catheter, each at 0.25 mg/hr Check heparin level/CBC/fibrinogen Q6h x 4 post-procedure Monitor for s/sx bleeding F/u plan for angio [facility] mechanical thrombectomy  6/9   Arturo Morton, PharmD, BCPS Please check AMION for all [place] contact numbers Clinical Pharmacist 03/28/2020 9:42 PM

## 2020-03-28 NOTE — Progress Notes (Signed)
PROGRESS NOTE                                                                                                                                                                                                             Patient Demographics:    [name], is a 54 y.o. female, DOB - [birth year], SKA:[id number]  Admit date - 03/21/2020   Admitting Physician Lenore Cordia, MD  Outpatient Primary MD for the patient is Glendale Chard, MD  LOS - 6   Chief Complaint  Patient presents with  . Hypotension  . Back Pain       Brief Narrative    TAKAYLA BAILLIE is a 54 y.o. female with medical history significant for type 2 diabetes, hypothyroidism, hyperlipidemia, history of PE, and history of colon cancer who presents to the ED for evaluation of new onset low back pain with lower extremity weakness and change in sensation, she was recently diagnosed with COVID-19 on 03/10/2020.  She did not require hospitalization or specific treatment at that time however has been having progressive fatigue and malaise since then.  She was given antibiotics for possible UTI earlier today.    Is presented to ED 6/1 secondary to complaints of lower back pain and weakness in both of her legs.  CTA chest PE study and CT abdomen/pelvis with contrast is negative for PE.  Focal patchy airspace opacities are seen throughout both lungs.  Hepatic steatosis is noted. -MRI thoracic/lumbar spine were significant for T7 disc herniation, does not with any focal deficits, but it did show evidence of IVC filter clotting, had venous Doppler showing extensive DVT in the bilateral lower extremities for which vascular surgeon has been consulted.    Subjective:    Farrah Skoda today he denies any dyspnea, cough, still reports some significant lower extremity edema.   Assessment  & Plan :    Principal Problem:   Sepsis due to COVID-19 [facility]) Active Problems:   Hyperlipidemia    Hypothyroidism   Type 2 diabetes mellitus without complication, without long-term current use of insulin ([place])    Sepsis due to COVID-19 pneumonitis - SARS-CoV-2 PCR positive on 03/11/2019.  Has been having progressive fatigue, weakness.  CT imaging shows multifocal infiltrates. -Patient with no hypoxia, but there is imaging significant for multifocal opacity. -Continue with  Steroids,  -treated  with IV remdesivir. -Encouraged to use incentive spirometer, she is currently now at bedrest. -She is currently off IV antibiotics.  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 -Vascular surgery consult greatly appreciated, for now continue with full anticoagulation, patient will need mechanical thrombectomy with possible lysis, this is a 2-day surgery, to be done today/tomorrow, and she will require ICU stay as discussed with vascular surgery  Acute kidney injury: Likely secondary to sepsis, Resolved.  Thoracic radiculopathy -Patient initially presented with lower extremity weakness, this is most likely related to limited range of motion due to pain from her, discussed MRI findings with Dr. Arnoldo Morale, who reviewed her MRI thoracic/lumbar spine, there is no emergency, and patient can follow with neurosurgery as an outpatient if it does cause any symptoms.  Hyperglycemia and type 2 diabetes with mild DKA : -She is initially on insulin drip, this has been stopped, he has resolved. -CBG are improved after increasing her Lantus to 16 units, and add 4 units before meals, and  sliding scale.  We will go ahead and lower her Lantus given she will be n.p.o.. -Started on Jardiance. -Beta hydroxybutyric acid has normalized. -Hemoglobin A1c is 8.6 -Required bicarb drip initially given low bicarb level, this has resolved  Hyperkalemia: -   Resolved with hydration  Hypothyroidism: Continue Synthroid.     Hyperlipidemia: Continue simvastatin.  COVID-19 Labs  Recent Labs    03/26/20 0537 03/27/20 1136 03/28/20 0517  DDIMER 14.55*  --   --   CRP 1.6* 1.3* 0.6    Lab Results  Component Value Date   SARSCOV2NAA POSITIVE (A) 03/10/2020     Code Status : Full   Family Communication  :  D/W patient Disposition Plan  :  Status is: Inpatient  Remains inpatient appropriate because:IV treatments appropriate due to intensity of illness or inability to take PO   Dispo: The patient is from: Home              Anticipated d/c is to: Home              Anticipated d/c date is: 3 days              Patient currently is not medically stable to d/c.      Consults  :  Vascular Surgery  Procedures  : None  DVT Prophylaxis  :  Heparin GTT  Lab Results  Component Value Date   PLT 177 03/28/2020    Antibiotics  :    Anti-infectives (From admission, onward)   Start     Dose/Rate Route Frequency Ordered Stop   03/23/20 2200  azithromycin (ZITHROMAX) tablet 500 mg  Status:  Discontinued     500 mg Oral Daily at bedtime 03/23/20 0806 03/25/20 1233   03/23/20 1000  remdesivir 100 mg in sodium chloride 0.9 % 100 mL IVPB     100 mg 200 mL/hr over 30 Minutes Intravenous Daily 03/22/20 0110 03/26/20 1913   03/22/20 2200  azithromycin (ZITHROMAX) 500 mg in sodium chloride 0.9 % 250 mL IVPB  Status:  Discontinued     500 mg 250 mL/hr over 60 Minutes Intravenous Every 24 hours 03/22/20 0111 03/23/20 0806   03/22/20 2200  cefTRIAXone (ROCEPHIN) 1 g in sodium chloride 0.9 % 100 mL IVPB  Status:  Discontinued     1 g 200 mL/hr over 30 Minutes Intravenous Every 24 hours 03/22/20 0111 03/25/20 1233   03/22/20 0200  remdesivir 200 mg in sodium chloride 0.9% 250 mL IVPB     200 mg 580 mL/hr over 30 Minutes Intravenous Once 03/22/20 0110 03/22/20 1900   03/22/20 0015  cefTRIAXone (ROCEPHIN) 1 g in sodium chloride 0.9 % 100 mL IVPB     1 g 200 mL/hr over 30 Minutes Intravenous  Once  03/22/20 0010 03/22/20 0103   03/21/20 2300  cefTRIAXone (ROCEPHIN) injection 1 g  Status:  Discontinued     1 g Intramuscular  Once 03/21/20 2253 03/22/20 0010   03/21/20 2300  azithromycin (ZITHROMAX) 500 mg in sodium chloride 0.9 % 250 mL IVPB     500 mg 250 mL/hr over 60 Minutes Intravenous  Once 03/21/20 2253 03/22/20 0129        Objective:   Vitals:   03/27/20 2300 03/28/20 0335 03/28/20 0742 03/28/20 0751  BP: 94/68 101/71 99/72 102/64  Pulse:   (!) 58 66  Resp: 18  10 20   Temp: 98 F (36.7 C) 98.2 F (36.8 C) 97.8 F (36.6 C) 97.9 F (36.6 C)  TempSrc: Oral Oral Oral Oral  SpO2:   97% 93%  Weight:      Height:        Wt Readings from Last 3 Encounters:  03/22/20 94.2 kg  02/15/20 91.2 kg  08/02/19 95.3 kg     Intake/Output Summary (Last 24 hours) at 03/28/2020 1203 Last data filed at 03/28/2020 0540 Gross per 24 hour  Intake 688.75 ml  Output 1250 ml  Net -561.25 ml     Physical Exam  Awake Alert, Oriented X 3, No new F.N deficits, Normal affect Symmetrical Chest wall movement, Good air movement bilaterally, CTAB RRR,No Gallops,Rubs or new Murmurs, No Parasternal Heave +ve B.Sounds, Abd Soft, No tenderness, No rebound - guarding or rigidity. No Cyanosis, Clubbing ,she has lower extremity edema bilaterally.    Data Review:    CBC Recent Labs  Lab 03/22/20 0241 03/22/20 0241 03/23/20 0548 03/23/20 0548 03/24/20 0834 03/25/20 0506 03/26/20 0537 03/27/20 1136 03/28/20 0517  WBC 15.1*   < > 14.6*   < > 9.5 7.8 7.7 8.2 7.8  HGB 12.6   < > 12.1   < > 11.8* 11.3* 11.2* 11.5* 11.2*  HCT 40.6   < > 38.4   < > 36.4 35.5* 36.0 36.1 35.6*  PLT 208   < > 141*   < > 164 161 183 167 177  MCV 95.1   < > 92.8   < > 91.9 92.2 93.0 93.0 93.4  MCH 29.5   < > 29.2   < > 29.8 29.4 28.9 29.6 29.4  MCHC 31.0   < > 31.5   < > 32.4 31.8 31.1 31.9 31.5  RDW 13.5   < > 13.8   < > 13.9 13.9 14.0 14.4 14.4  LYMPHSABS 0.8  --  0.8  --  0.6* 0.8 1.4  --   --   MONOABS  0.3  --  0.2  --  0.1 0.2 0.5  --   --   EOSABS 0.0  --  0.0  --  0.0 0.0 0.0  --   --   BASOSABS 0.0  --  0.0  --  0.0 0.0 0.0  --   --    < > = values in this interval not displayed.    [place]  Lab 03/22/20 0241 03/22/20 2219 03/23/20 1224 03/23/20 1224 03/24/20 3704 03/25/20 8889 03/26/20 0537 03/27/20 1136 03/28/20 1694  NA 139   < > 137   < > 134* 135 138 135 137  K 4.0   < > 4.1   < > 4.0 4.4 4.3 4.1 4.4  CL 112*   < > 103   < > 101 102 101 102 103  CO2 11*   < > 20*   < > 23 25 27 26 25   GLUCOSE 288*   < > 224*   < > 329* 276* 229* 214* 164*  BUN 26*   < > 22*   < > 24* 25* 26* 26* 27*  CREATININE 1.24*   < > 0.85   < > 0.79 0.79 0.95 0.92 0.89  CALCIUM 8.0*   < > 8.3*   < > 8.6* 8.7* 9.3 9.1 9.1  MG 1.5*  --  2.0  --   --   --   --   --   --   AST 17  --  23   < > 22 21 21 20 18   ALT 19  --  19   < > 21 25 24 30 28   ALKPHOS 77  --  74   < > 84 75 68 73 68  BILITOT 0.8  --  0.9   < > 0.7 0.7 0.6 0.7 0.7   < > = values in this interval not displayed.   ------------------------------------------------------------------------------------------------------------------ No results for input(s): CHOL, HDL, LDLCALC, TRIG, CHOLHDL, LDLDIRECT in the last 72 hours.  Lab Results  Component Value Date   HGBA1C 8.6 (H) 03/23/2020   ------------------------------------------------------------------------------------------------------------------ No results for input(s): TSH, T4TOTAL, T3FREE, THYROIDAB in the last 72 hours.  Invalid input(s): FREET3 ------------------------------------------------------------------------------------------------------------------ No results for input(s): VITAMINB12, FOLATE, FERRITIN, TIBC, IRON, RETICCTPCT in the last 72 hours.  Coagulation profile Recent Labs  Lab 03/21/20 2143  INR 1.5*    Recent Labs    03/26/20 0537  DDIMER 14.55*    Cardiac Enzymes No results for input(s): CKMB, TROPONINI, MYOGLOBIN in the  last 168 hours.  Invalid input(s): CK ------------------------------------------------------------------------------------------------------------------    Component Value Date/Time   BNP 29.9 03/21/2020 2148    Inpatient Medications  Scheduled Meds: . vitamin C  500 mg Oral Daily  . Chlorhexidine Gluconate Cloth  6 each Topical Daily  . dexamethasone  4 mg Oral Daily  . famotidine  20 mg Oral BID  . feeding supplement (GLUCERNA SHAKE)  237 mL Oral TID BM  . insulin aspart  0-15 Units Subcutaneous TID WC  . insulin aspart  0-5 Units Subcutaneous QHS  . insulin aspart  4 Units Subcutaneous TID WC  . insulin glargine  16 Units Subcutaneous Daily  . Ipratropium-Albuterol  1 puff Inhalation Q6H  . levothyroxine  100 mcg Oral Q0600  . linagliptin  5 mg Oral Daily  . simvastatin  10 mg Oral Daily  . sodium chloride flush  3 mL Intravenous Q12H  . zinc sulfate  220 mg Oral Daily   Continuous Infusions: . sodium chloride 100 mL/hr at 03/28/20 0441  . heparin 900 Units/hr (03/28/20 0652)   PRN Meds:.acetaminophen, chlorpheniramine-HYDROcodone, dextrose, guaiFENesin-dextromethorphan, HYDROmorphone (DILAUDID) injection, loperamide, ondansetron **OR** ondansetron (ZOFRAN) IV, oxyCODONE, senna-docusate, zolpidem  Micro Results Recent Results (from the past 240 hour(s))  Blood Culture (routine x 2)     Status: None   Collection Time: 03/21/20 10:00 PM   Specimen: BLOOD  Result Value Ref Range Status   Specimen Description BLOOD LEFT ANTECUBITAL  Final   Special Requests   Final  BOTTLES DRAWN AEROBIC AND ANAEROBIC Blood Culture results may not be optimal due to an inadequate volume of blood received in culture bottles   Culture   Final    NO GROWTH 5 DAYS Performed at [facility], [place] [address] [place], [place] [address]    Report Status 03/26/2020 FINAL  Final  Blood Culture (routine x 2)     Status: None   Collection Time: 03/21/20 11:41 PM   Specimen: BLOOD LEFT HAND   Result Value Ref Range Status   Specimen Description BLOOD LEFT HAND  Final   Special Requests   Final    BOTTLES DRAWN AEROBIC AND ANAEROBIC Blood Culture adequate volume   Culture   Final    NO GROWTH 5 DAYS Performed at [facility], [place] [address] [place], [place] [address]    Report Status 03/27/2020 FINAL  Final  Urine culture     Status: None   Collection Time: 03/22/20  1:48 AM   Specimen: In/Out Cath Urine  Result Value Ref Range Status   Specimen Description IN/OUT CATH URINE  Final   Special Requests NONE  Final   Culture   Final    NO GROWTH Performed at [facility], [place] [address] [place], [place] [address]    Report Status 03/22/2020 FINAL  Final  MRSA PCR Screening     Status: None   Collection Time: 03/22/20 12:32 PM   Specimen: Nasal Mucosa; Nasopharyngeal  Result Value Ref Range Status   MRSA by PCR NEGATIVE NEGATIVE Final    Comment:        The GeneXpert MRSA Assay (FDA approved for NASAL specimens only), is one component of a comprehensive MRSA colonization surveillance program. It is not intended to diagnose MRSA infection nor to guide or monitor treatment for MRSA infections. Performed at [facility], [place] [address], [place], [place] [address]     Radiology Reports CT Angio Chest PE W and/or Wo Contrast  Result Date: 03/22/2020 CLINICAL DATA:  Severe back pain hypotension and shallow EXAM: CT ANGIOGRAPHY CHEST WITH CONTRAST TECHNIQUE: Multidetector CT imaging of the chest was performed using the standard protocol during bolus administration of intravenous contrast. Multiplanar CT image reconstructions and MIPs were obtained to evaluate the vascular anatomy. CONTRAST:  77mL OMNIPAQUE IOHEXOL 350 MG/ML SOLN COMPARISON:  None. FINDINGS: Cardiovascular: There is a optimal opacification of the pulmonary arteries. There is no central,segmental, or subsegmental filling defects within the pulmonary arteries. The heart is normal in size.  No pericardial effusion or thickening. No evidence right heart strain. There is normal three-vessel brachiocephalic anatomy without proximal stenosis. The thoracic aorta is normal in appearance. Mediastinum/Nodes: No hilar, mediastinal, or axillary adenopathy. Thyroid gland, trachea, and esophagus demonstrate no significant findings. Lungs/Pleura: Multifocal patchy ground-glass opacities are seen predominantly within the periphery of both lungs. No pleural effusion or pneumothorax is seen. Upper Abdomen: No acute abnormalities present in the visualized portions of the upper abdomen. Musculoskeletal: No chest wall abnormality. No acute or significant osseous findings. Review of the MIP images confirms the above findings. Abdomen/pelvis: Hepatobiliary: There is diffuse low density seen throughout the liver parenchyma. The main portal vein is patent. No evidence of calcified gallstones, gallbladder wall thickening or biliary dilatation. Pancreas: Unremarkable. No pancreatic ductal dilatation or surrounding inflammatory changes. Spleen: Normal in size without focal abnormality. Adrenals/Urinary Tract: Both adrenal glands appear normal. The kidneys and collecting system appear normal without evidence of urinary tract calculus or hydronephrosis.  Bladder is unremarkable. Stomach/Bowel: The stomach, small bowel, and colon are normal in appearance. No inflammatory changes, wall thickening, or obstructive findings. There appears to be a surgical anastomosis at the sigmoid rectal junction. Mild presacral soft tissue density is again identified. Vascular/Lymphatic: There are no enlarged mesenteric, retroperitoneal, or pelvic lymph nodes. An infrarenal IVC filter is seen at approximately the L3 level. There is scattered aortic and bi-iliac calcifications noted. Reproductive: The uterus and adnexa are unremarkable. Other: No evidence of abdominal wall mass or hernia. Musculoskeletal: No acute or significant osseous findings.  IMPRESSION: No central, segmental, or subsegmental pulmonary embolism. Multifocal patchy airspace opacities throughout both lungs, likely consistent with multifocal pneumonia. Hepatic steatosis Aortic Atherosclerosis (ICD10-I70.0). Electronically Signed   By: Prudencio Pair M.D.   On: 03/22/2020 00:07   MR THORACIC SPINE W WO CONTRAST  Result Date: 03/22/2020 CLINICAL DATA:  Bilateral lower extremity pain and numbness with urinary retention. Diabetes. History of colon cancer. EXAM: MRI THORACIC WITHOUT AND WITH CONTRAST TECHNIQUE: Multiplanar and multiecho pulse sequences of the thoracic spine were obtained without and with intravenous contrast. CONTRAST:  9.62mL GADAVIST GADOBUTROL 1 MMOL/ML IV SOLN COMPARISON:  CT chest done yesterday. FINDINGS: MRI THORACIC SPINE FINDINGS Alignment:  Normal Vertebrae: Normal. No fracture or primary bone lesion. No metastatic disease. Insignificant small hemangioma within the T11 vertebral body. Cord: No primary cord pathology. See below regarding stenosis at T7-8. Paraspinal and other soft tissues: Negative Disc levels: No abnormality at T5-6 or above. T6-7: Small right paracentral disc protrusion indents the thecal sac slightly but does not cause apparent neural compression. T7-8: Left posterolateral disc herniation effaces the ventral subarachnoid space and indents the left side of the cord. This could be symptomatic. T8-9 through T12-L1: Normal. 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 Ample subarachnoid space is present dorsal to the cord at this level. None the less, this could possibly be symptomatic. Small right paracentral disc herniation at T6-7 without apparent neural compression. Electronically Signed   By: Nelson Chimes M.D.   On: 03/22/2020 12:32   MR Lumbar Spine W Wo Contrast  Result Date: 03/22/2020 CLINICAL DATA:  Back pain.  Urinary retention.   Diabetes. EXAM: MRI LUMBAR SPINE WITHOUT AND WITH CONTRAST TECHNIQUE: Multiplanar and multiecho pulse sequences of the lumbar spine were obtained without and with intravenous contrast. CONTRAST:  9.28mL GADAVIST GADOBUTROL 1 MMOL/ML IV SOLN COMPARISON:  None. FINDINGS: Segmentation:  5 lumbar type vertebral bodies. Alignment:  Slightly exaggerated lower lumbar lordosis. Vertebrae:  No fracture or primary bone lesion. Conus medullaris and cauda equina: Conus extends to the L1 level. Conus and cauda equina appear normal. Paraspinal and other soft tissues: There appears be thrombosis of the IVC and both iliac veins up to the level of the IVC filter. Flow is restored in the IVC by inflow from the renal veins. Disc levels: No abnormality from L1-2 through L2-3. L3-4: No disc abnormality.  Mild facet hypertrophy.  No stenosis. L4-5: No disc abnormality. Mild to moderate facet hypertrophy. No stenosis. L5-S1: Minimal disc bulge.  Mild facet hypertrophy.  No stenosis. IMPRESSION: No significant disc pathology. No stenosis or neural compression in the lumbar region. Patient has exaggerated lumbar lordosis. 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 Flow is restored in the IVC above that by inflow from the renal veins. I am in the process of calling this report. Electronically Signed   By: Nelson Chimes M.D.   On: 03/22/2020 12:46   CT ABDOMEN PELVIS W CONTRAST  Result Date: 03/22/2020 CLINICAL DATA:  Severe back pain hypotension and shallow EXAM: CT ANGIOGRAPHY CHEST WITH CONTRAST TECHNIQUE: Multidetector CT imaging of the chest was performed using the standard protocol during bolus administration of intravenous contrast. Multiplanar CT image reconstructions and MIPs were obtained to evaluate the vascular anatomy. CONTRAST:  56mL  OMNIPAQUE IOHEXOL 350 MG/ML SOLN COMPARISON:  None. FINDINGS: Cardiovascular: There is a optimal opacification of the pulmonary arteries. There is no central,segmental, or subsegmental filling defects within the pulmonary arteries. The heart is normal in size. No pericardial effusion or thickening. No evidence right heart strain. There is normal three-vessel brachiocephalic anatomy without proximal stenosis. The thoracic aorta is normal in appearance. Mediastinum/Nodes: No hilar, mediastinal, or axillary adenopathy. Thyroid gland, trachea, and esophagus demonstrate no significant findings. Lungs/Pleura: Multifocal patchy ground-glass opacities are seen predominantly within the periphery of both lungs. No pleural effusion or pneumothorax is seen. Upper Abdomen: No acute abnormalities present in the visualized portions of the upper abdomen. Musculoskeletal: No chest wall abnormality. No acute or significant osseous findings. Review of the MIP images confirms the above findings. Abdomen/pelvis: Hepatobiliary: There is diffuse low density seen throughout the liver parenchyma. The main portal vein is patent. No evidence of calcified gallstones, gallbladder wall thickening or biliary dilatation. Pancreas: Unremarkable. No pancreatic ductal dilatation or surrounding inflammatory changes. Spleen: Normal in size without focal abnormality. Adrenals/Urinary Tract: Both adrenal glands appear normal. The kidneys and collecting system appear normal without evidence of urinary tract calculus or hydronephrosis. Bladder is unremarkable. Stomach/Bowel: The stomach, small bowel, and colon are normal in appearance. No inflammatory changes, wall thickening, or obstructive findings. There appears to be a surgical anastomosis at the sigmoid rectal junction. Mild presacral soft tissue density is again identified. Vascular/Lymphatic: There are no enlarged mesenteric, retroperitoneal, or pelvic lymph nodes. An infrarenal IVC filter is seen  at approximately the L3 level. There is scattered aortic and bi-iliac calcifications noted. Reproductive: The uterus and adnexa are unremarkable. Other: No evidence of abdominal wall mass or hernia. Musculoskeletal: No acute or significant osseous findings. IMPRESSION: No central, segmental, or subsegmental pulmonary embolism. Multifocal patchy airspace opacities throughout both lungs, likely consistent with multifocal pneumonia. Hepatic steatosis Aortic Atherosclerosis (ICD10-I70.0). Electronically Signed   By: Prudencio Pair M.D.   On: 03/22/2020 00:07   DG Chest Port 1 View  Result Date: 03/21/2020 CLINICAL DATA:  54 year old female with weakness and hypotension. EXAM: PORTABLE CHEST 1 VIEW COMPARISON:  Chest radiograph dated 03/25/2016 FINDINGS: Faint bilateral peripheral and subpleural densities, right greater left concerning for pneumonia, possibly atypical or viral in etiology including COVID-19. Clinical correlation is recommended. No focal consolidation, pleural effusion, pneumothorax. The cardiac silhouette is within limits. No acute osseous pathology. IMPRESSION: Findings concerning for pneumonia. Electronically Signed   By: Anner Crete M.D.   On: 03/21/2020 22:17   VAS [place] LOWER EXTREMITY VENOUS (DVT)  Result Date: 03/22/2020  Lower Venous DVTStudy Indications: Hx DVT and IVC filter, now with COVID and elevated D dimer.  Comparison Study: 06/07/15 negative Performing Technologist: June Leap RDMS, RVT  Examination Guidelines: A complete evaluation includes B-mode imaging, spectral Doppler, color Doppler, and power Doppler as needed of all accessible portions of each vessel. Bilateral  testing is considered an integral part of a complete examination. Limited examinations for reoccurring indications may be performed as noted. The reflux portion of the exam is performed with the patient in reverse Trendelenburg.  +---------+---------------+---------+-----------+----------+-----------------+ RIGHT     CompressibilityPhasicitySpontaneityPropertiesThrombus Aging    +---------+---------------+---------+-----------+----------+-----------------+ CFV      None           No       No                   Acute             +---------+---------------+---------+-----------+----------+-----------------+ SFJ      None                                         Acute             +---------+---------------+---------+-----------+----------+-----------------+ FV Prox  None                                         Acute             +---------+---------------+---------+-----------+----------+-----------------+ FV Mid   None                                         Acute             +---------+---------------+---------+-----------+----------+-----------------+ FV DistalNone                                         Acute             +---------+---------------+---------+-----------+----------+-----------------+ PFV      None                                         Acute             +---------+---------------+---------+-----------+----------+-----------------+ POP      None           No       No                   Acute             +---------+---------------+---------+-----------+----------+-----------------+ PTV      None                                         Acute             +---------+---------------+---------+-----------+----------+-----------------+ PERO     None                                         Acute             +---------+---------------+---------+-----------+----------+-----------------+ Gastroc  None  Acute             +---------+---------------+---------+-----------+----------+-----------------+ GSV      None                                         Acute             +---------+---------------+---------+-----------+----------+-----------------+ Ex Iliac                No       No                   Age  Indeterminate +---------+---------------+---------+-----------+----------+-----------------+ Com Iliac                                             Not visualized    +---------+---------------+---------+-----------+----------+-----------------+   +---------+---------------+---------+-----------+----------+-----------------+ LEFT     CompressibilityPhasicitySpontaneityPropertiesThrombus Aging    +---------+---------------+---------+-----------+----------+-----------------+ CFV      None           No       No                   Acute             +---------+---------------+---------+-----------+----------+-----------------+ SFJ      None                                         Acute             +---------+---------------+---------+-----------+----------+-----------------+ FV Prox  None                                         Acute             +---------+---------------+---------+-----------+----------+-----------------+ FV Mid   None                                         Acute             +---------+---------------+---------+-----------+----------+-----------------+ FV DistalNone                                         Acute             +---------+---------------+---------+-----------+----------+-----------------+ PFV      None                                         Acute             +---------+---------------+---------+-----------+----------+-----------------+ POP      None           No       No                   Acute             +---------+---------------+---------+-----------+----------+-----------------+ PTV  None                                         Acute             +---------+---------------+---------+-----------+----------+-----------------+ PERO     None                                         Acute             +---------+---------------+---------+-----------+----------+-----------------+ Gastroc  None                                          Acute             +---------+---------------+---------+-----------+----------+-----------------+ GSV      None                                         Acute             +---------+---------------+---------+-----------+----------+-----------------+ Ex Iliac                No       No                   Age Indeterminate +---------+---------------+---------+-----------+----------+-----------------+ Com Iliac                                             Not visualized    +---------+---------------+---------+-----------+----------+-----------------+     Summary: RIGHT: - Findings consistent with acute deep vein thrombosis involving the right common femoral vein, SF junction, right femoral vein, right proximal profunda vein, right popliteal vein, right posterior tibial veins, right peroneal veins, and right gastrocnemius veins. - Findings consistent with acute superficial vein thrombosis involving the right great saphenous vein. - Thrombosis extends into the Right Iliac vein and IVC up to the level of the filter placement.  LEFT: - Findings consistent with acute deep vein thrombosis involving the left common femoral vein, SF junction, left femoral vein, left proximal profunda vein, left popliteal vein, left posterior tibial veins, left peroneal veins, and left gastrocnemius veins. - Findings consistent with acute superficial vein thrombosis involving the left great saphenous vein. - Thrombosis extends into the Left Iliac vein.  *See table(s) above for measurements and observations. Electronically signed by Curt Jews MD on 03/22/2020 at 5:19:17 PM.    Final      Phillips Climes M.D on 03/28/2020 at 12:03 PM    [facility] -  Office  [phone]

## 2020-03-28 NOTE — Anesthesia Postprocedure Evaluation (Signed)
Anesthesia Post Note  Patient: [name]  Procedure(s) Performed: PLACEMENT OF bilateral lower  Leg LYSIS CATHETER. (Bilateral Leg Lower)     Patient location during evaluation: PACU Anesthesia Type: MAC Level of consciousness: awake and alert Pain management: pain level controlled Vital Signs Assessment: post-procedure vital signs reviewed and stable Respiratory status: spontaneous breathing and respiratory function stable Cardiovascular status: stable Postop Assessment: no apparent nausea or vomiting Anesthetic complications: no    Last Vitals:  Vitals:   03/28/20 2002 03/28/20 2015  BP: 116/76 115/60  Pulse: (!) 54 (!) 56  Resp: 14 12  Temp: 36.7 C   SpO2: 98%     Last Pain:  Vitals:   03/28/20 2002  TempSrc:   PainSc: 0-No pain                 Hitomi Slape DANIEL

## 2020-03-28 NOTE — Progress Notes (Signed)
ANTICOAGULATION CONSULT NOTE - Follow Up Consult  Pharmacy Consult for Heparin Indication: Clotted IVC filter and BL DVT's  Allergies  Allergen Reactions  . Metformin And Related Other (See Comments)    Chest pain  . Penicillins Hives    Patient Measurements: Height: 5\' 11"  (180.3 cm) Weight: 94.2 kg (207 lb 10.8 oz) IBW/kg (Calculated) : 70.8 Heparin Dosing Weight:    Vital Signs: Temp: 97.9 F (36.6 C) (06/08 1208) Temp Source: Oral (06/08 1208) BP: 97/70 (06/08 1208) Pulse Rate: 66 (06/08 0751)  Labs: Recent Labs    03/26/20 0537 03/26/20 0537 03/27/20 1136 03/28/20 0517 03/28/20 1357  HGB 11.2*   < > 11.5* 11.2*  --   HCT 36.0  --  36.1 35.6*  --   PLT 183  --  167 177  --   HEPARINUNFRC 0.39   < > 0.52 0.76* 0.38  CREATININE 0.95  --  0.92 0.89  --    < > = values in this interval not displayed.    Estimated Creatinine Clearance: 92.6 mL/min (by C-G formula based on SCr of 0.89 mg/dL).  Assessment:  Anticoag: Heparin for h/o DVT x2, d-dimer down to 14.5,  CTA negative for PE  but IVC filter clotted and venous Doppler showing extensive DVT in the bilateral lower extremities  - HL 0.76>0.38 now back in goal range. Hgb 11.2 stable. Plts 177 stable.  Goal of Therapy:  Heparin level 0.3-0.7 units/ml Monitor platelets by anticoagulation protocol: Yes   Plan:  Con't heparin to 900 units/hr Daily heparin level and CBC   Archimedes Harold S. Alford Highland, PharmD, BCPS Clinical Staff Pharmacist [email or web address] Alford Highland, [employer] 03/28/2020,2:56 PM

## 2020-03-28 NOTE — Interval H&P Note (Signed)
History and Physical Interval Note:  03/28/2020 5:52 PM  [name]  has presented today for surgery, with the diagnosis of hypotension and back pain.  The various methods of treatment have been discussed with the patient and family. After consideration of risks, benefits and other options for treatment, the patient has consented to  Procedure(s): PLACEMENT OF LYSIS CATHETER (N/A) as a surgical intervention.  The patient's history has been reviewed, patient examined, no change in status, stable for surgery.  I have reviewed the patient's chart and labs.  Questions were answered to the patient's satisfaction.     [name]

## 2020-03-28 NOTE — TOC Benefit Eligibility Note (Signed)
Transition of Care [facility]) Benefit Eligibility Note    Patient Details  Name: [name] MRN: [id number] Date of Birth: 22-Jan-1966   Medication/Dose: Alveda Reasons 20 MG DAILY   and   XARELTO 15 MG BID  COVER- YES  P/A-YES # [phone]  OVER Q/L     RIVAROXABAN  : NON-FORMULARY  Covered?: Yes  Tier: 2 Drug  Prescription Coverage Preferred Pharmacy: [facility] with Person/Company/Phone Number:: NORA  @ [employer] # [phone]  Co-Pay: $40.00  Prior Approval: No  Deductible: Unmet(OUT-OF-POCKET-UNMET)  Additional Notes: ELIQUIS  2.5 MG BID   and ELIQUIS  5 MG BID COVER- YES CO-PAY- $40.00 TIER- 2 DRUG  P/A-NO   APIXABAN and ELIQUIS  5 MG BID : NON-FORMULARY    Memory Argue Phone Number: 03/28/2020, 12:08 PM

## 2020-03-28 NOTE — Care Management (Signed)
Benefit check sent for DOACs 

## 2020-03-28 NOTE — Transfer of Care (Signed)
Immediate Anesthesia Transfer of Care Note  Patient: [name]  Procedure(s) Performed: PLACEMENT OF bilateral lower  Leg LYSIS CATHETER. (Bilateral Leg Lower)  Patient Location: PACU  Anesthesia Type:MAC  Level of Consciousness: awake, alert , oriented and patient cooperative  Airway & Oxygen Therapy: Patient Spontanous Breathing  Post-op Assessment: Report given to RN and Post -op Vital signs reviewed and stable  Post vital signs: Reviewed and stable  Last Vitals:  Vitals Value Taken Time  BP    Temp    Pulse    Resp    SpO2      Last Pain:  Vitals:   03/28/20 1546  TempSrc: Oral  PainSc:       Patients Stated Pain Goal: 7 (46/80/32 1224)  Complications: No apparent anesthesia complications

## 2020-03-28 NOTE — Op Note (Signed)
    Patient name: [name] MRN: [id number] DOB: [birth year] Sex: female  03/28/2020 Pre-operative Diagnosis: Occluded inferior vena cava Post-operative diagnosis:  Same Surgeon:  [name] Assistants: None Procedure:   #1: Ultrasound-guided access, bilateral popliteal vein   #2: Intravascular ultrasound, bilateral femoral, common femoral, external iliac, common iliac veins, and inferior vena cava   #3: Placement of bilateral intravenous infusion catheter for thrombolytic therapy into the vena cava.   #4: Initiation of thrombolytic therapy (TPA into the inferior vena cava, common iliac, external iliac, common femoral, and femoral veins Anesthesia: MAC Blood Loss: Minimal Specimens: None  Findings: Extensive thrombus in the vena cava, bilateral iliac and femoral veins.  No thrombus was visualized above the vena cava filter  Indications: This is a 54 year old female who is now Covid positive.  She has a history of pulmonary embolism.  She had a vena cava filter placed in 2012 around the time of colon cancer diagnosis and subsequent resection.  Her filter was attempted to be removed however thrombus was visualized and so it was left.  She recently developed pain and swelling in both legs and her back.  Imaging revealed occlusion of her vena cava filter and extensive thrombus distally.  She comes in today for initiation of thrombolytic therapy with plans for mechanical thrombectomy and filter removal tomorrow  Procedure:  The patient was identified in the holding area and taken to [facility] [facility]  The patient was then placed supine on the table. MAC anesthesia was administered.  The patient was prepped and draped in the usual sterile fashion.  A time out was called and antibiotics were administered.  Ultrasound was used to evaluate bilateral popliteal veins which were fully compressible.  Bilateral popliteal veins were then cannulated under ultrasound guidance with a micropuncture needle.  An 018  wire was advanced without resistance and a micropuncture sheath was placed.  I then used a Berenstein 2 catheter to direct a Bentson wire into the suprarenal vena cava.  Amplatz superstiff wires were placed bilaterally.  8 French sheaths were then placed.  Intravascular ultrasound was then used to evaluate bilateral femoral, common femoral, external iliac, common iliac veins as well as inferior vena cava.  There was extensive thrombus visualized from the vena cava distally.  Next, 50 cm infusion length UniFuse catheters were placed.  The tip of the catheter was were just above the filter and the distal mark was located within the sheath.  tPA therapy was then initiated at 0.25 mg/h through each catheter.  The popliteal sheaths were then sutured into position with 2-0 silk suture.  Sterile dressings were applied.  The patient was then taken to the ICU in stable condition   Disposition: To ICU stable   V. [name], M.D., [facility] [place] Office: [phone] Pager:  [phone]

## 2020-03-28 NOTE — Progress Notes (Signed)
ANTICOAGULATION CONSULT NOTE - Follow Up Consult  Pharmacy Consult for Heparin Indication: h/o PE, now BL DVTs  Allergies  Allergen Reactions  . Metformin And Related Other (See Comments)    Chest pain  . Penicillins Hives    Patient Measurements: Height: 5\' 11"  (180.3 cm) Weight: 94.2 kg (207 lb 10.8 oz) IBW/kg (Calculated) : 70.8 Heparin Dosing Weight:  92 kg  Vital Signs: Temp: 98.2 F (36.8 C) (06/08 0335) Temp Source: Oral (06/08 0335) BP: 101/71 (06/08 0335)  Labs: Recent Labs    03/26/20 0537 03/26/20 0537 03/27/20 1136 03/28/20 0517  HGB 11.2*   < > 11.5* 11.2*  HCT 36.0  --  36.1 35.6*  PLT 183  --  167 177  HEPARINUNFRC 0.39  --  0.52 0.76*  CREATININE 0.95  --  0.92  --    < > = values in this interval not displayed.    Estimated Creatinine Clearance: 89.5 mL/min (by C-G formula based on SCr of 0.92 mg/dL).   Assessment:  Anticoag: Heparin for h/o DVT x2, d-dimer down to 14.5,  CTA negative for PE  but IVC filter clotted and venous Doppler showing extensive DVT in the bilateral lower extremities.  Heparin level slightly supratherapeutic (0.76) on gtt at 1000 units/hr. No bleeding noted.  Goal of Therapy:  Heparin level 0.3-0.7 units/ml Monitor platelets by anticoagulation protocol: Yes   Plan:  Decrease heparin gtt to 900 units/hr F/u 6 hr heparin level  Sherlon Handing, PharmD, BCPS Please see amion for complete clinical pharmacist phone list 03/28/2020,6:47 AM

## 2020-03-28 NOTE — Anesthesia Preprocedure Evaluation (Addendum)
Anesthesia Evaluation  Patient identified by MRN, date of birth, ID band Patient awake    Reviewed: Allergy & Precautions, NPO status , Patient's Chart, lab work & pertinent test results  Airway Mallampati: II  TM Distance: >3 FB Neck ROM: Full    Dental no notable dental hx. (+) Dental Advisory Given   Pulmonary neg pulmonary ROS,  COVID Pos   Pulmonary exam normal        Cardiovascular + DVT  Normal cardiovascular exam  Acute DVT/SVT IVC insertion   Neuro/Psych negative neurological ROS  negative psych ROS   GI/Hepatic negative GI ROS, Neg liver ROS, Colon Ca   Endo/Other  diabetesHypothyroidism   Renal/GU negative Renal ROS     Musculoskeletal negative musculoskeletal ROS (+)   Abdominal   Peds  Hematology negative hematology ROS (+) anemia ,   Anesthesia Other Findings   Reproductive/Obstetrics                            Anesthesia Physical Anesthesia Plan  ASA: III and emergent  Anesthesia Plan: MAC   Post-op Pain Management:    Induction:   PONV Risk Score and Plan: 2 and Ondansetron and Propofol infusion  Airway Management Planned: Natural Airway  Additional Equipment:   Intra-op Plan:   Post-operative Plan:   Informed Consent:     History available from chart only  Plan Discussed with:   Anesthesia Plan Comments:         Anesthesia Quick Evaluation

## 2020-03-29 ENCOUNTER — Inpatient Hospital Stay (HOSPITAL_COMMUNITY): Payer: No Typology Code available for payment source

## 2020-03-29 ENCOUNTER — Encounter (HOSPITAL_COMMUNITY)
Admission: EM | Disposition: A | Discharge status: Home Health | Payer: Self-pay | Source: Home / Self Care | Attending: Internal Medicine

## 2020-03-29 ENCOUNTER — Inpatient Hospital Stay (HOSPITAL_COMMUNITY): Payer: No Typology Code available for payment source | Admitting: Certified Registered Nurse Anesthetist

## 2020-03-29 ENCOUNTER — Encounter (HOSPITAL_COMMUNITY): Payer: Self-pay | Admitting: Internal Medicine

## 2020-03-29 DIAGNOSIS — E782 Mixed hyperlipidemia: Secondary | ICD-10-CM

## 2020-03-29 HISTORY — PX: APPLICATION OF ANGIOVAC: SHX6777

## 2020-03-29 HISTORY — PX: ENDOVASCULAR STENT INSERTION: SHX5161

## 2020-03-29 HISTORY — PX: THROMBECTOMY ILIAC ARTERY: SHX6405

## 2020-03-29 HISTORY — PX: AORTA - BILATERAL FEMORAL ARTERY BYPASS GRAFT: SHX1175

## 2020-03-29 LAB — CBC
HCT: 36.7 % (ref 36.0–46.0)
Hemoglobin: 11.7 g/dL — ABNORMAL LOW (ref 12.0–15.0)
MCH: 30 pg (ref 26.0–34.0)
MCHC: 31.9 g/dL (ref 30.0–36.0)
MCV: 94.1 fL (ref 80.0–100.0)
Platelets: 143 10*3/uL — ABNORMAL LOW (ref 150–400)
RBC: 3.9 MIL/uL (ref 3.87–5.11)
RDW: 15 % (ref 11.5–15.5)
WBC: 6.6 10*3/uL (ref 4.0–10.5)
nRBC: 0.3 % — ABNORMAL HIGH (ref 0.0–0.2)

## 2020-03-29 LAB — POCT I-STAT 7, (LYTES, BLD GAS, ICA,H+H)
Acid-Base Excess: 0 mmol/L (ref 0.0–2.0)
Acid-Base Excess: 1 mmol/L (ref 0.0–2.0)
Acid-Base Excess: 3 mmol/L — ABNORMAL HIGH (ref 0.0–2.0)
Acid-base deficit: 2 mmol/L (ref 0.0–2.0)
Bicarbonate: 24.9 mmol/L (ref 20.0–28.0)
Bicarbonate: 25.3 mmol/L (ref 20.0–28.0)
Bicarbonate: 27 mmol/L (ref 20.0–28.0)
Bicarbonate: 23 mmol/L (ref 20.0–28.0)
Calcium, Ion: 1.27 mmol/L (ref 1.15–1.40)
Calcium, Ion: 1.07 mmol/L — ABNORMAL LOW (ref 1.15–1.40)
Calcium, Ion: 1.12 mmol/L — ABNORMAL LOW (ref 1.15–1.40)
Calcium, Ion: 1.25 mmol/L (ref 1.15–1.40)
HCT: 32 % — ABNORMAL LOW (ref 36.0–46.0)
HCT: 25 % — ABNORMAL LOW (ref 36.0–46.0)
HCT: 28 % — ABNORMAL LOW (ref 36.0–46.0)
HCT: 20 % — ABNORMAL LOW (ref 36.0–46.0)
Hemoglobin: 10.9 g/dL — ABNORMAL LOW (ref 12.0–15.0)
Hemoglobin: 8.5 g/dL — ABNORMAL LOW (ref 12.0–15.0)
Hemoglobin: 9.5 g/dL — ABNORMAL LOW (ref 12.0–15.0)
Hemoglobin: 6.8 g/dL — CL (ref 12.0–15.0)
O2 Saturation: 100 %
O2 Saturation: 100 %
O2 Saturation: 100 %
O2 Saturation: 100 %
Potassium: 4.2 mmol/L (ref 3.5–5.1)
Potassium: 3.9 mmol/L (ref 3.5–5.1)
Potassium: 3.8 mmol/L (ref 3.5–5.1)
Potassium: 3.8 mmol/L (ref 3.5–5.1)
Sodium: 137 mmol/L (ref 135–145)
Sodium: 136 mmol/L (ref 135–145)
Sodium: 137 mmol/L (ref 135–145)
Sodium: 138 mmol/L (ref 135–145)
TCO2: 26 mmol/L (ref 22–32)
TCO2: 26 mmol/L (ref 22–32)
TCO2: 28 mmol/L (ref 22–32)
TCO2: 24 mmol/L (ref 22–32)
pCO2 arterial: 39.9 mmHg (ref 32.0–48.0)
pCO2 arterial: 37.8 mmHg (ref 32.0–48.0)
pCO2 arterial: 38.1 mmHg (ref 32.0–48.0)
pCO2 arterial: 42.2 mmHg (ref 32.0–48.0)
pH, Arterial: 7.403 (ref 7.350–7.450)
pH, Arterial: 7.433 (ref 7.350–7.450)
pH, Arterial: 7.457 — ABNORMAL HIGH (ref 7.350–7.450)
pH, Arterial: 7.345 — ABNORMAL LOW (ref 7.350–7.450)
pO2, Arterial: 257 mmHg — ABNORMAL HIGH (ref 83.0–108.0)
pO2, Arterial: 215 mmHg — ABNORMAL HIGH (ref 83.0–108.0)
pO2, Arterial: 177 mmHg — ABNORMAL HIGH (ref 83.0–108.0)
pO2, Arterial: 200 mmHg — ABNORMAL HIGH (ref 83.0–108.0)

## 2020-03-29 LAB — BASIC METABOLIC PANEL
Anion gap: 10 (ref 5–15)
BUN: 18 mg/dL (ref 6–20)
CO2: 19 mmol/L — ABNORMAL LOW (ref 22–32)
Calcium: 7.7 mg/dL — ABNORMAL LOW (ref 8.9–10.3)
Chloride: 108 mmol/L (ref 98–111)
Creatinine, Ser: 0.63 mg/dL (ref 0.44–1.00)
GFR calc Af Amer: >60 mL/min (ref >60)
GFR calc non Af Amer: >60 mL/min (ref >60)
Glucose, Bld: 298 mg/dL — ABNORMAL HIGH (ref 70–99)
Potassium: 4.1 mmol/L (ref 3.5–5.1)
Sodium: 137 mmol/L (ref 135–145)

## 2020-03-29 LAB — CBC WITH DIFFERENTIAL/PLATELET
Abs Immature Granulocytes: 0.35 10*3/uL — ABNORMAL HIGH (ref 0.00–0.07)
Basophils Absolute: 0 10*3/uL (ref 0.0–0.1)
Basophils Relative: 0 %
Eosinophils Absolute: 0 10*3/uL (ref 0.0–0.5)
Eosinophils Relative: 0 %
HCT: 36.7 % (ref 36.0–46.0)
Hemoglobin: 12.4 g/dL (ref 12.0–15.0)
Immature Granulocytes: 2 %
Lymphocytes Relative: 9 %
Lymphs Abs: 1.4 10*3/uL (ref 0.7–4.0)
MCH: 29.2 pg (ref 26.0–34.0)
MCHC: 33.8 g/dL (ref 30.0–36.0)
MCV: 86.6 fL (ref 80.0–100.0)
Monocytes Absolute: 1 10*3/uL (ref 0.1–1.0)
Monocytes Relative: 7 %
Neutro Abs: 11.9 10*3/uL — ABNORMAL HIGH (ref 1.7–7.7)
Neutrophils Relative %: 82 %
Platelets: UNDETERMINED 10*3/uL (ref 150–400)
RBC: 4.24 MIL/uL (ref 3.87–5.11)
RDW: 15.3 % (ref 11.5–15.5)
WBC: 14.6 10*3/uL — ABNORMAL HIGH (ref 4.0–10.5)
nRBC: 0.1 % (ref 0.0–0.2)

## 2020-03-29 LAB — COMPREHENSIVE METABOLIC PANEL
ALT: 35 U/L (ref 0–44)
AST: 31 U/L (ref 15–41)
Albumin: 2.8 g/dL — ABNORMAL LOW (ref 3.5–5.0)
Alkaline Phosphatase: 82 U/L (ref 38–126)
Anion gap: 6 (ref 5–15)
BUN: 22 mg/dL — ABNORMAL HIGH (ref 6–20)
CO2: 26 mmol/L (ref 22–32)
Calcium: 8.8 mg/dL — ABNORMAL LOW (ref 8.9–10.3)
Chloride: 105 mmol/L (ref 98–111)
Creatinine, Ser: 0.87 mg/dL (ref 0.44–1.00)
GFR calc Af Amer: >60 mL/min (ref >60)
GFR calc non Af Amer: >60 mL/min (ref >60)
Glucose, Bld: 182 mg/dL — ABNORMAL HIGH (ref 70–99)
Potassium: 5.2 mmol/L — ABNORMAL HIGH (ref 3.5–5.1)
Sodium: 137 mmol/L (ref 135–145)
Total Bilirubin: 1.6 mg/dL — ABNORMAL HIGH (ref 0.3–1.2)
Total Protein: 6 g/dL — ABNORMAL LOW (ref 6.5–8.1)

## 2020-03-29 LAB — HEPARIN LEVEL (UNFRACTIONATED): Heparin Unfractionated: 0.22 IU/mL — ABNORMAL LOW (ref 0.30–0.70)

## 2020-03-29 LAB — PREPARE RBC (CROSSMATCH)

## 2020-03-29 LAB — FIBRINOGEN: Fibrinogen: 243 mg/dL (ref 210–475)

## 2020-03-29 LAB — POCT ACTIVATED CLOTTING TIME
Activated Clotting Time: 164 seconds
Activated Clotting Time: 406 seconds
Activated Clotting Time: >1000 seconds
Activated Clotting Time: 351 seconds
Activated Clotting Time: 357 seconds
Activated Clotting Time: 285 seconds
Activated Clotting Time: 125 seconds

## 2020-03-29 LAB — GLUCOSE, CAPILLARY
Glucose-Capillary: 165 mg/dL — ABNORMAL HIGH (ref 70–99)
Glucose-Capillary: 223 mg/dL — ABNORMAL HIGH (ref 70–99)
Glucose-Capillary: 221 mg/dL — ABNORMAL HIGH (ref 70–99)

## 2020-03-29 LAB — C-REACTIVE PROTEIN: CRP: 1.8 mg/dL — ABNORMAL HIGH (ref <1.0)

## 2020-03-29 SURGERY — APPLICATION OF ANGIOVAC
Anesthesia: General | Site: Abdomen

## 2020-03-29 MED ORDER — LACTATED RINGERS IV SOLN: INTRAVENOUS | Status: DC | PRN

## 2020-03-29 MED ORDER — ATROPINE SULFATE 0.4 MG/ML IV SOSY
PREFILLED_SYRINGE | INTRAVENOUS | Status: DC | PRN
Start: 2020-03-29 — End: 2020-03-29
  Administered 2020-03-29: .4 mg via INTRAVENOUS

## 2020-03-29 MED ORDER — FENTANYL CITRATE (PF) 100 MCG/2ML IJ SOLN
INTRAMUSCULAR | Status: DC | PRN
  Administered 2020-03-29: 150 ug via INTRAVENOUS
  Administered 2020-03-29 (×3): 50 ug via INTRAVENOUS

## 2020-03-29 MED ORDER — PROPOFOL 10 MG/ML IV BOLUS
INTRAVENOUS | Status: DC | PRN
  Administered 2020-03-29: 120 mg via INTRAVENOUS

## 2020-03-29 MED ORDER — ROCURONIUM BROMIDE 10 MG/ML (PF) SYRINGE
PREFILLED_SYRINGE | INTRAVENOUS | Status: DC | PRN
  Administered 2020-03-29: 10 mg via INTRAVENOUS
  Administered 2020-03-29: 50 mg via INTRAVENOUS
  Administered 2020-03-29: 30 mg via INTRAVENOUS
  Administered 2020-03-29: 50 mg via INTRAVENOUS

## 2020-03-29 MED ORDER — MIDAZOLAM HCL 5 MG/5ML IJ SOLN
INTRAMUSCULAR | Status: DC | PRN
  Administered 2020-03-29: 2 mg via INTRAVENOUS

## 2020-03-29 MED ORDER — SODIUM CHLORIDE 0.9 % IV SOLN
INTRAVENOUS | Status: AC
  Filled 2020-03-29: qty 1.2

## 2020-03-29 MED ORDER — CALCIUM CHLORIDE 10 % IV SOLN
INTRAVENOUS | Status: DC | PRN
  Administered 2020-03-29: 300 mg via INTRAVENOUS
  Administered 2020-03-29: 500 mg via INTRAVENOUS
  Administered 2020-03-29: 200 mg via INTRAVENOUS

## 2020-03-29 MED ORDER — VANCOMYCIN HCL 1000 MG IV SOLR
INTRAVENOUS | Status: DC | PRN
  Administered 2020-03-29: 1000 mg via INTRAVENOUS

## 2020-03-29 MED ORDER — LACTATED RINGERS IV SOLN
INTRAVENOUS | Status: DC | PRN
Start: 2020-03-29 — End: 2020-03-29

## 2020-03-29 MED ORDER — VANCOMYCIN HCL IN DEXTROSE 1-5 GM/200ML-% IV SOLN
INTRAVENOUS | Status: AC
  Filled 2020-03-29: qty 200

## 2020-03-29 MED ORDER — SUCCINYLCHOLINE CHLORIDE 200 MG/10ML IV SOSY
PREFILLED_SYRINGE | INTRAVENOUS | Status: DC | PRN
Start: 2020-03-29 — End: 2020-03-29
  Administered 2020-03-29: 120 mg via INTRAVENOUS

## 2020-03-29 MED ORDER — FENTANYL CITRATE (PF) 250 MCG/5ML IJ SOLN
INTRAMUSCULAR | Status: AC
  Filled 2020-03-29: qty 5

## 2020-03-29 MED ORDER — ALBUMIN HUMAN 5 % IV SOLN
INTRAVENOUS | Status: DC | PRN
Start: 2020-03-29 — End: 2020-03-29

## 2020-03-29 MED ORDER — LIDOCAINE 2% (20 MG/ML) 5 ML SYRINGE
INTRAMUSCULAR | Status: DC | PRN
  Administered 2020-03-29: 40 mg via INTRAVENOUS

## 2020-03-29 MED ORDER — VANCOMYCIN HCL 1000 MG IV SOLR
INTRAVENOUS | Status: AC
  Filled 2020-03-29: qty 1000

## 2020-03-29 MED ORDER — SODIUM CHLORIDE 0.9% IV SOLUTION: Freq: Once | INTRAVENOUS | Status: DC

## 2020-03-29 MED ORDER — VASOPRESSIN 20 UNIT/ML IV SOLN
INTRAVENOUS | Status: AC
  Filled 2020-03-29: qty 1

## 2020-03-29 MED ORDER — ALBUMIN HUMAN 5 % IV SOLN
INTRAVENOUS | Status: AC
  Administered 2020-03-29: 12.5 g via INTRAVENOUS
  Filled 2020-03-29: qty 250

## 2020-03-29 MED ORDER — PHENYLEPHRINE HCL-NACL 10-0.9 MG/250ML-% IV SOLN
INTRAVENOUS | Status: DC | PRN
  Administered 2020-03-29: 25 ug/min via INTRAVENOUS

## 2020-03-29 MED ORDER — VASOPRESSIN 20 UNIT/ML IV SOLN
INTRAVENOUS | Status: DC | PRN
  Administered 2020-03-29 (×2): 2 [IU] via INTRAVENOUS
  Administered 2020-03-29: 1 [IU] via INTRAVENOUS

## 2020-03-29 MED ORDER — SODIUM CHLORIDE 0.9% FLUSH: 10.0000 mL | INTRAVENOUS | Status: DC | PRN

## 2020-03-29 MED ORDER — SUGAMMADEX SODIUM 200 MG/2ML IV SOLN
INTRAVENOUS | Status: DC | PRN
Start: 2020-03-29 — End: 2020-03-29
  Administered 2020-03-29: 200 mg via INTRAVENOUS

## 2020-03-29 MED ORDER — SODIUM CHLORIDE 0.9% FLUSH
10.0000 mL | Freq: Two times a day (BID) | INTRAVENOUS | Status: DC
  Administered 2020-03-29 – 2020-04-05 (×6): 10 mL

## 2020-03-29 MED ORDER — MIDAZOLAM HCL 2 MG/2ML IJ SOLN
INTRAMUSCULAR | Status: AC
  Filled 2020-03-29: qty 2

## 2020-03-29 MED ORDER — PHENYLEPHRINE 40 MCG/ML (10ML) SYRINGE FOR IV PUSH (FOR BLOOD PRESSURE SUPPORT)
PREFILLED_SYRINGE | INTRAVENOUS | Status: DC | PRN
  Administered 2020-03-29: 80 ug via INTRAVENOUS

## 2020-03-29 MED ORDER — ONDANSETRON HCL 4 MG/2ML IJ SOLN
INTRAMUSCULAR | Status: DC | PRN
  Administered 2020-03-29: 4 mg via INTRAVENOUS

## 2020-03-29 MED ORDER — SODIUM CHLORIDE 0.9 % IV SOLN
INTRAVENOUS | Status: DC | PRN
  Administered 2020-03-29: 1000 mL

## 2020-03-29 MED ORDER — HEPARIN SODIUM (PORCINE) 1000 UNIT/ML IJ SOLN
INTRAMUSCULAR | Status: DC | PRN
Start: 2020-03-29 — End: 2020-03-29
  Administered 2020-03-29: 14000 [IU] via INTRAVENOUS

## 2020-03-29 MED ORDER — PROTAMINE SULFATE 10 MG/ML IV SOLN
INTRAVENOUS | Status: DC | PRN
Start: 2020-03-29 — End: 2020-03-29
  Administered 2020-03-29 (×2): 20 mg via INTRAVENOUS
  Administered 2020-03-29: 10 mg via INTRAVENOUS

## 2020-03-29 MED ORDER — HEPARIN (PORCINE) 25000 UT/250ML-% IV SOLN
900.0000 [IU]/h | INTRAVENOUS | Status: DC
  Administered 2020-03-29: 800 [IU]/h via INTRAVENOUS
  Filled 2020-03-29: qty 250

## 2020-03-29 MED ORDER — NOREPINEPHRINE 4 MG/250ML-% IV SOLN
INTRAVENOUS | Status: DC | PRN
Start: 2020-03-29 — End: 2020-03-29
  Administered 2020-03-29: 2 ug/min via INTRAVENOUS

## 2020-03-29 MED ORDER — PROPOFOL 10 MG/ML IV BOLUS
INTRAVENOUS | Status: AC
  Filled 2020-03-29: qty 20

## 2020-03-29 MED ORDER — CHLORHEXIDINE GLUCONATE CLOTH 2 % EX PADS
6.0000 | MEDICATED_PAD | Freq: Every day | CUTANEOUS | Status: DC
  Administered 2020-03-30 – 2020-04-10 (×7): 6 via TOPICAL

## 2020-03-29 MED ORDER — ALBUMIN HUMAN 5 % IV SOLN: 12.5000 g | Freq: Once | INTRAVENOUS | Status: AC

## 2020-03-29 MED ORDER — SODIUM CHLORIDE 0.9 % IV SOLN
INTRAVENOUS | Status: DC | PRN
Start: 2020-03-29 — End: 2020-03-29

## 2020-03-29 MED ORDER — IODIXANOL 320 MG/ML IV SOLN
INTRAVENOUS | Status: DC | PRN
  Administered 2020-03-29: 15 mL
  Administered 2020-03-29: 150 mL

## 2020-03-29 SURGICAL SUPPLY — 137 items
ADH SKN CLS APL DERMABOND .7 (GAUZE/BANDAGES/DRESSINGS) ×6
ANGIOVAC CIRCUIT (MISCELLANEOUS) ×5
BAG BANDED W/RUBBER/TAPE 36X54 (MISCELLANEOUS) ×5 IMPLANT
BAG DECANTER FOR FLEXI CONT (MISCELLANEOUS) ×5 IMPLANT
BAG EQP BAND 135X91 W/RBR TAPE (MISCELLANEOUS) ×1
BAG ISL DRAPE 18X18 STRL (DRAPES) ×4
BAG ISOLATION DRAPE 18X18 (DRAPES) ×6 IMPLANT
BALLN ATLAS 14X40X75 (BALLOONS) ×5
BALLN MUSTANG 12X60X75 (BALLOONS) ×5
BALLOON ATLAS 14X40X75 (BALLOONS) ×3 IMPLANT
BALLOON MUSTANG 12X60X75 (BALLOONS) ×3 IMPLANT
BNDG ELASTIC 4X5.8 VLCR STR LF (GAUZE/BANDAGES/DRESSINGS) ×10 IMPLANT
BNDG ELASTIC 6X5.8 VLCR STR LF (GAUZE/BANDAGES/DRESSINGS) ×10 IMPLANT
CANISTER SUCT 3000ML PPV (MISCELLANEOUS) ×5 IMPLANT
CANNULA C180 ANGIOVAC 180 DEG (CANNULA) IMPLANT
CANNULA C20 ANGIOVAC 20 DEG (CANNULA) ×5 IMPLANT
CANNULA OPTISITE PERFUSION 16F (CANNULA) IMPLANT
CANNULA OPTISITE PERFUSION 18F (CANNULA) ×5 IMPLANT
CANNULA OPTISITE PERFUSION 20F (CANNULA) IMPLANT
CATH ACCU-VU SIZ PIG 5F 100CM (CATHETERS) ×5 IMPLANT
CATH ANGIO 5F BER2 100CM (CATHETERS) ×5 IMPLANT
CATH ANGIO 5F BER2 65CM (CATHETERS) ×5 IMPLANT
CATH OMNI FLUSH 5F 65CM (CATHETERS) ×5 IMPLANT
CATH ROBINSON RED A/P 18FR (CATHETERS) ×5 IMPLANT
CATH THROMBEC 7F 135 CLEANER15 (MISCELLANEOUS) ×5 IMPLANT
CATH VISIONS PV .035 IVUS (CATHETERS) ×5 IMPLANT
CIRCUIT ANGIOVAC (MISCELLANEOUS) ×3 IMPLANT
CLIP VESOCCLUDE MED 24/CT (CLIP) IMPLANT
CLIP VESOCCLUDE SM WIDE 24/CT (CLIP) IMPLANT
COVER DOME SNAP 22 D (MISCELLANEOUS) ×5 IMPLANT
COVER MAYO STAND STRL (DRAPES) ×5 IMPLANT
COVER TRANSDUCER ULTRASND GEL (DISPOSABLE) ×5 IMPLANT
COVER WAND RF STERILE (DRAPES) ×5 IMPLANT
DERMABOND ADVANCED (GAUZE/BANDAGES/DRESSINGS) ×4
DERMABOND ADVANCED .7 DNX12 (GAUZE/BANDAGES/DRESSINGS) ×6 IMPLANT
DEVICE ENSNARE  12MMX20MM (VASCULAR PRODUCTS)
DEVICE ENSNARE 12MMX20MM (VASCULAR PRODUCTS) IMPLANT
DEVICE INFLATION ENCORE 26 (MISCELLANEOUS) IMPLANT
DEVICE ONE SNARE 15MM (VASCULAR PRODUCTS) ×5 IMPLANT
DEVICE TORQUE H2O (MISCELLANEOUS) ×5 IMPLANT
DEVICE TORQUE KENDALL .025-038 (MISCELLANEOUS) ×5 IMPLANT
DRAPE HALF SHEET 40X57 (DRAPES) ×10 IMPLANT
DRAPE INCISE IOBAN 66X45 STRL (DRAPES) IMPLANT
DRAPE ISOLATION BAG 18X18 (DRAPES) ×10
DRAPE TABLE COVER HEAVY DUTY (DRAPES) ×5 IMPLANT
DRSG TEGADERM 2-3/8X2-3/4 SM (GAUZE/BANDAGES/DRESSINGS) ×5 IMPLANT
DRYSEAL FLEXSHEATH 18FR 33CM (SHEATH) ×2
DRYSEAL FLEXSHEATH 26FR 33CM (SHEATH)
DRYSEAL FLEXSHEATH 26FR 65CM (SHEATH) ×6
ELECT BLADE 4.0 EZ CLEAN MEGAD (MISCELLANEOUS)
ELECT BLADE 6.5 EXT (BLADE) IMPLANT
ELECT REM PT RETURN 9FT ADLT (ELECTROSURGICAL) ×5
ELECTRODE BLDE 4.0 EZ CLN MEGD (MISCELLANEOUS) IMPLANT
ELECTRODE REM PT RTRN 9FT ADLT (ELECTROSURGICAL) ×3 IMPLANT
EVACUATOR SILICONE 100CC (DRAIN) IMPLANT
FELT TEFLON 1X6 (MISCELLANEOUS) IMPLANT
GAUZE SPONGE 2X2 8PLY STRL LF (GAUZE/BANDAGES/DRESSINGS) ×3 IMPLANT
GAUZE SPONGE 4X4 12PLY STRL LF (GAUZE/BANDAGES/DRESSINGS) ×10 IMPLANT
GLIDEWIRE ADV .035X260CM (WIRE) ×5 IMPLANT
GLOVE BIO SURGEON STRL SZ7.5 (GLOVE) ×5 IMPLANT
GOWN STRL REUS W/ TWL LRG LVL3 (GOWN DISPOSABLE) ×6 IMPLANT
GOWN STRL REUS W/ TWL XL LVL3 (GOWN DISPOSABLE) ×3 IMPLANT
GOWN STRL REUS W/TWL LRG LVL3 (GOWN DISPOSABLE) ×10
GOWN STRL REUS W/TWL XL LVL3 (GOWN DISPOSABLE) ×5
GUIDEWIRE ANGLED .035X150CM (WIRE) IMPLANT
GUIDEWIRE ANGLED .035X260CM (WIRE) ×5 IMPLANT
INSERT FOGARTY 61MM (MISCELLANEOUS) IMPLANT
INSERT FOGARTY SM (MISCELLANEOUS) IMPLANT
KIT BASIN OR (CUSTOM PROCEDURE TRAY) ×5 IMPLANT
KIT CATH LUMEN 2 8FR 16CM STRL (SET/KITS/TRAYS/PACK) ×5 IMPLANT
KIT DILATOR VASC 18G NDL (KITS) ×5 IMPLANT
KIT ENCORE 26 ADVANTAGE (KITS) ×5 IMPLANT
KIT TURNOVER KIT B (KITS) ×5 IMPLANT
LUBRICANT VIPERSLIDE CORONARY (MISCELLANEOUS) ×5 IMPLANT
NEEDLE HYPO 25GX1X1/2 BEV (NEEDLE) IMPLANT
NS IRRIG 1000ML POUR BTL (IV SOLUTION) ×5 IMPLANT
PACK AORTA (CUSTOM PROCEDURE TRAY) IMPLANT
PACK GENERAL/GYN (CUSTOM PROCEDURE TRAY) ×5 IMPLANT
PACK PERIPHERAL VASCULAR (CUSTOM PROCEDURE TRAY) ×5 IMPLANT
PACK UNIVERSAL I (CUSTOM PROCEDURE TRAY) ×10 IMPLANT
PAD ARMBOARD 7.5X6 YLW CONV (MISCELLANEOUS) ×5 IMPLANT
POSITIONER HEAD DONUT 9IN (MISCELLANEOUS) ×5 IMPLANT
PROTECTION STATION PRESSURIZED (MISCELLANEOUS) ×5
RETAINER VISCERA MED (MISCELLANEOUS) IMPLANT
SET MICROPUNCTURE 5F STIFF (MISCELLANEOUS) ×5 IMPLANT
SHEATH BRITE TIP 8FR 23CM (SHEATH) ×5 IMPLANT
SHEATH BRITE TIP 8FR 35CM (SHEATH) ×5 IMPLANT
SHEATH DRYSEAL FLEX 18FR 33CM (SHEATH) ×3 IMPLANT
SHEATH DRYSEAL FLEX 26FR 33CM (SHEATH) IMPLANT
SHEATH DRYSEAL FLEX 26FR 65CM (SHEATH) ×9 IMPLANT
SHEATH FAST CATH 10F 12CM (SHEATH) ×5 IMPLANT
SHEATH PINNACLE 5F 10CM (SHEATH) ×10 IMPLANT
SHEATH PINNACLE 5FR (SHEATH) ×5 IMPLANT
SHEATH PINNACLE 8F 10CM (SHEATH) ×10 IMPLANT
SHEATH PINNACLE R/O II 6F 4CM (SHEATH) IMPLANT
SHIELD RADPAD SCOOP 12X17 (MISCELLANEOUS) IMPLANT
SLEEVE ISOL F/PACE RF HD COVER (MISCELLANEOUS) ×20 IMPLANT
SNARE GOOSENECK 10MM (VASCULAR PRODUCTS) IMPLANT
SOL PREP PROV IODINE SCRUB 4OZ (MISCELLANEOUS) ×5 IMPLANT
SOLUTION BETADINE 4OZ (MISCELLANEOUS) ×5 IMPLANT
SPONGE GAUZE 2X2 STER 10/PKG (GAUZE/BANDAGES/DRESSINGS) ×2
STATION PROTECTION PRESSURIZED (MISCELLANEOUS) ×3 IMPLANT
STENT WALLSTENT 18X90X75 (Permanent Stent) ×5 IMPLANT
STOPCOCK 4 WAY LG BORE MALE ST (IV SETS) ×20 IMPLANT
SUT ETHILON 2 0 PSLX (SUTURE) IMPLANT
SUT MNCRL AB 4-0 PS2 18 (SUTURE) ×5 IMPLANT
SUT PDS AB 1 CTX 36 (SUTURE) ×5 IMPLANT
SUT PDS AB 1 TP1 54 (SUTURE) IMPLANT
SUT PROLENE 3 0 SH 48 (SUTURE) IMPLANT
SUT PROLENE 5 0 C 1 24 (SUTURE) IMPLANT
SUT PROLENE 5 0 C 1 36 (SUTURE) IMPLANT
SUT SILK 2 0 (SUTURE)
SUT SILK 2 0 PERMA HAND 18 BK (SUTURE) IMPLANT
SUT SILK 2 0 SH (SUTURE) ×25 IMPLANT
SUT SILK 2 0 TIES 17X18 (SUTURE)
SUT SILK 2 0SH CR/8 30 (SUTURE) IMPLANT
SUT SILK 2-0 18XBRD TIE 12 (SUTURE) IMPLANT
SUT SILK 2-0 18XBRD TIE BLK (SUTURE) IMPLANT
SUT SILK 3 0 (SUTURE)
SUT SILK 3 0 TIES 17X18 (SUTURE)
SUT SILK 3-0 18XBRD TIE 12 (SUTURE) IMPLANT
SUT SILK 3-0 18XBRD TIE BLK (SUTURE) IMPLANT
SUT VIC AB 2-0 CT1 27 (SUTURE)
SUT VIC AB 2-0 CT1 TAPERPNT 27 (SUTURE) IMPLANT
SUT VIC AB 3-0 SH 27 (SUTURE) ×10
SUT VIC AB 3-0 SH 27X BRD (SUTURE) ×6 IMPLANT
SYR 10ML LL (SYRINGE) ×20 IMPLANT
SYR 20ML LL LF (SYRINGE) ×30 IMPLANT
SYR 30ML LL (SYRINGE) ×10 IMPLANT
TOWEL GREEN STERILE (TOWEL DISPOSABLE) ×10 IMPLANT
TOWEL SURG RFD BLUE STRL DISP (DISPOSABLE) ×5 IMPLANT
TRAY FOLEY MTR SLVR 16FR STAT (SET/KITS/TRAYS/PACK) IMPLANT
WATER STERILE IRR 1000ML POUR (IV SOLUTION) ×10 IMPLANT
WIRE AMPLATZ SS-J .035X180CM (WIRE) ×5 IMPLANT
WIRE AMPLATZ SSTIFF .035X260CM (WIRE) ×10 IMPLANT
WIRE BENTSON .035X145CM (WIRE) ×10 IMPLANT
WIRE MICRO SET SILHO 5FR 7 (SHEATH) ×5 IMPLANT

## 2020-03-29 NOTE — Anesthesia Procedure Notes (Signed)
Arterial Line Insertion Start/End6/06/2020 9:29 AM, 03/29/2020 9:31 AM Performed by: Effie Berkshire, MD, anesthesiologist  Patient location: Pre-op. Preanesthetic checklist: patient identified, IV checked, site marked, risks and benefits discussed, surgical consent, monitors and equipment checked, pre-op evaluation, timeout performed and anesthesia consent Lidocaine 1% used for infiltration Right, radial was placed Catheter size: 20 Fr Hand hygiene performed  and maximum sterile barriers used   Attempts: 1 Procedure performed without using ultrasound guided technique. Following insertion, dressing applied and Biopatch. Post procedure assessment: normal and unchanged  Patient tolerated the procedure well with no immediate complications.

## 2020-03-29 NOTE — Anesthesia Procedure Notes (Signed)
Procedure Name: Intubation Date/Time: 03/29/2020 9:24 AM Performed by: Candis Shine, CRNA Pre-anesthesia Checklist: Patient identified, Emergency Drugs available, Suction available and Patient being monitored Patient Re-evaluated:Patient Re-evaluated prior to induction Oxygen Delivery Method: Circle System Utilized Preoxygenation: Pre-oxygenation with 100% oxygen Induction Type: IV induction and Rapid sequence Laryngoscope Size: Mac and 3 Grade View: Grade II Tube type: Oral Tube size: 7.5 mm Number of attempts: 1 Airway Equipment and Method: Stylet Placement Confirmation: ETT inserted through vocal cords under direct vision,  positive ETCO2 and breath sounds checked- equal and bilateral Secured at: 22 cm Tube secured with: Tape Dental Injury: Teeth and Oropharynx as per pre-operative assessment

## 2020-03-29 NOTE — Op Note (Addendum)
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 Donzetta Matters, MD Co-Surgeon: [name], MD Procedure Performed: 1.  Ultrasound-guided cannulation of right internal jugular vein 2.  Ultrasound-guided cannulation of left internal jugular vein 3.  Ultrasound-guided placement of 7 French double-lumen central venous line and left internal jugular vein 4.  Intravascular ultrasound of bilateral femoral, bilateral common femoral, bilateral external iliac and bilateral common iliac veins and IVC 5.  IVC venogram 6.  Angio VAC and cleaner wire mechanical thrombectomy of IVC, bilateral common iliac, bilateral external iliac, bilateral common femoral and bilateral femoral veins 7.  Stent of IVC with 18 x 90 mm Wallstent 8.  Ultrasound-guided cannulation right common femoral vein 9.  Veno venous bypass for 58 minutes  Indications: 54 year old female with history of IVC filter placement at the time of colon cancer in 2012.  She now had COVID-19 and unfortunately thrombosed her IVC filter with bilateral lower extremity thrombus.  The day prior to this procedure she had placement of bilateral popliteal vein lytic catheters.  She is now indicated for lytic recheck and antibiotic mechanical thrombectomy.  Findings: There was persistent thrombus in her common femoral veins bilaterally.  It did appear that there was thrombus down into her femoral veins but much of this was rouleaux flow.  External and common iliac veins bilaterally had thrombus as well as the IVC.  After performing mechanical thrombectomy of both lower extremities including femoral, common femoral, external iliac and bilateral common iliac veins as well as the IVC we then remove the filter.  Unfortunately this caused perforation of her IVC.  Ultimately this required stenting with 18 x  90 mm Wallstent.  We did have good flow through the Wallstent the suprarenal IVC remained somewhat flattened likely given patient's volume status but she did have inline flow from her bilateral lower extremities through her infrarenal IVC stent up to her suprarenal IVC.  Both renal veins were identified prior to placing the stent and the stent was placed below this area.  It did appear that her perforation sealed prior to stenting.  Stent was placed for IVC perforation and what appeared to be heaped up chronic thrombus versus IVC tear.  There was some residual chronic thrombus in the right femoral vein all other thrombus appeared to be removed at completion after mechanical thrombectomy.   Procedure:  The patient was identified in the holding area and taken to the operating room where she is placed supine operative when general anesthesia was induced.  She was sterilely prepped draped in the bilateral legs bilateral neck and chest in the usual fashion antibiotics were administered timeout was called.  We began by rewiring our catheters from below and adjusting our 8 French sheaths suturing these to the legs.  We also used ultrasound guidance to cannulate the right IJ in place a wire centrally followed by 5 [place] sheath.  Similarly on the left side we cannulated with 18-gauge needle placed the wire centrally and placed a 5 French sheath.  Above this we cannulated with 18-gauge needle using ultrasound guidance and placed a 7 French double-lumen central venous line.  We then proceeded with dilating the wire tract in both IJ's on the right we dilated up to 24 [place] and then placed a 26 French sheath over Amplatz wire under fluoroscopic guidance.  On the right side we dilated up to  16 and then placed an 42 [place] return sheath also with fluoroscopic guidance over Amplatz wire.  This time the patient was fully heparinized and ACT returned greater than 400.  We then assembled our mechanical thrombectomy sheath placed  this through our 26 French sheath.  The patient was hooked up to venovenous bypass this was maintained for total 58 minutes.  Initially the sheath was through the filter we had no flow.  We then removed our mechanical thrombectomy sheath unfortunately all of it was full of thrombus.  We attempted to flush the sheath but it was extremely thrombus laden.  We then placed the Amplatz wire back through the IVC filter and exchanged for a separate 26 French sheath.  We then again placed the mechanical thrombectomy suction device just below the filter at this time.  We then proceeded with intravascular ultrasound of both legs which demonstrated thrombus throughout both lower extremities with concomitant rouleaux flow.  We then proceeded with clear wire mechanical thrombectomy of both lower extremities.  We did return significant clot filling up to canisters.  We then repeated intravascular ultrasound demonstrated no further residual thrombus in the caval filter appeared to be mostly clean.  We did use the clear wire in the filter as well while on venovenous bypass.  This time we disconnected bypass in the cannula thrombectomy device was removed from the 26 French sheath.  We first attempted to snare the filter from below over the wire.  Embedded in the wall we also confirmed this with IVUS.  From above we then used a Glidewire and a snare we were able to snare around the filter down in the common iliac vein then brought this up through the 26 [place] sheath.  The filter did significantly straighten out.  We did attempt again to snare the hook from below but could not.  Ultimately we advanced a 26 French sheath over this and we remove the filter and it was entirely in 1 piece.  Unfortunately completion venography demonstrated what appeared to be contrast holding up below the renal veins.  On IVUS we could not really identify any good flow.  Patient did have some hypotension issues.  We cannulated the right common femoral vein  with ultrasound guidance with micropuncture needle followed the wire sheath.  We then placed an 8 French sheath there.  A 14 French balloon was then placed in the interim renal cava and was inflated and pressure did improve.  After pressure improved we attempted IVUS from the same sheath it did not appear that it was in the same plane as the suprarenal 26 French sheath.  From above then we placed a Glidewire advantage down through the 27 French sheath and we snared this from the right common femoral vein sheath.  We pulled through and through.  IVUS now demonstrated the same lumen however there was some heaped up old thrombus versus torn cava.  Venogram demonstrated that we did have a significant retroperitoneal leak in our IVC.  Again the patient again hypotensive and we inflated a balloon.  We elected to primarily stent.  While the balloon was inflated we cannulated the renal veins from above as we could not readily identify them by IVUS.  We performed bilateral renal venography and marked these on our screen.  We then brought an 18 x 90 mm Wallstent and deployed this in the infrarenal position and it deployed just above the IVC confluence.  This was postdilated with 14 mm balloon.  Completion demonstrated  that the stent was patent above the renal veins the cava was collapsed although patent.  There appeared to be some hematoma around the suprarenal IVC but it had no disease there as the filter had been in the infrarenal position.  There was good flow by venography through the sheath.  Satisfied with this we removed all of our wires.  We administered 50 mg of protamine.  We then removed all 5 of our sheaths and sutured in place bolsters with 2-0 silk suture.  We then wrapped the legs with Ace wraps.  Patient was then extubated awake from anesthesia and transferred directly to the ICU.  She did tolerate procedure well without immediate complication.  EBL: 1 L.   Evaristo Tsuda C. Donzetta Matters, MD [facility] [place] Office: [phone] Pager: [phone]

## 2020-03-29 NOTE — Progress Notes (Signed)
ANTICOAGULATION CONSULT NOTE - Follow Up Consult  Pharmacy Consult for Heparin Indication: Clotted IVC filter and BL DVT's  Allergies  Allergen Reactions  . Metformin And Related Other (See Comments)    Chest pain  . Penicillins Hives    Patient Measurements: Height: 5\' 11"  (180.3 cm) Weight: 94.2 kg (207 lb 10.8 oz) IBW/kg (Calculated) : 70.8 Heparin Dosing Weight: 90.2 kg  Vital Signs: Temp: 97.5 F (36.4 C) (06/09 0758) Temp Source: Oral (06/09 0758) BP: 115/80 (06/09 0900) Pulse Rate: 61 (06/09 0900)  Labs: Recent Labs    03/27/20 1136 03/27/20 1136 03/28/20 0517 03/28/20 1357 03/29/20 0615  HGB 11.5*   < > 11.2*  --  11.7*  HCT 36.1  --  35.6*  --  36.7  PLT 167  --  177  --  143*  HEPARINUNFRC 0.52   < > 0.76* 0.38 0.22*  CREATININE 0.92  --  0.89  --  0.87   < > = values in this interval not displayed.    Estimated Creatinine Clearance: 94.7 mL/min (by C-G formula based on SCr of 0.87 mg/dL).  Assessment: 79 yof presenting with occluded IVC filter, extensive DVT of bilateral lower extremities. Now s/p lytic catheter placement 6/8 with plan for angio [facility] mechanical thrombectomy on 6/9. Patient continuing on heparin post-procedure, now targeting lower heparin level goal 0.2-0.5 with patient continuing on alteplase running intra-catheter bilaterally at 0.25 mg/hr each.  Pt is now s/p OR, pharmacy asked to restart IV heparin at 8 pm.  Alteplase off.  Goal of Therapy:  Heparin level 0.3-0.7 Monitor platelets by anticoagulation protocol: Yes   Plan:  Restart IV heparin gtt at 800 units/hr at 8 pm. Check heparin level 6 hrs after gtt restarts Daily heparin level and CBC. F/u plans for oral anticoagulation eventually  [place] Crane, Roylene Reason, [facility] Clinical Pharmacist  03/29/2020 4:22 PM   [facility] pharmacy phone numbers are listed on [email or web address]

## 2020-03-29 NOTE — Anesthesia Preprocedure Evaluation (Addendum)
Anesthesia Evaluation  Patient identified by MRN, date of birth, ID band Patient awake    Reviewed: Allergy & Precautions, NPO status , Patient's Chart, lab work & pertinent test results  Airway Mallampati: II  TM Distance: >3 FB Neck ROM: Full    Dental  (+) Teeth Intact, Dental Advisory Given   Pulmonary neg pulmonary ROS,     + decreased breath sounds      Cardiovascular  Rhythm:Regular Rate:Normal     Neuro/Psych  Headaches, negative psych ROS   GI/Hepatic negative GI ROS, Neg liver ROS,   Endo/Other  diabetesHypothyroidism   Renal/GU negative Renal ROS     Musculoskeletal negative musculoskeletal ROS (+)   Abdominal Normal abdominal exam  (+)   Peds  Hematology negative hematology ROS (+)   Anesthesia Other Findings   Reproductive/Obstetrics                           Anesthesia Physical Anesthesia Plan  ASA: IV  Anesthesia Plan: General   Post-op Pain Management:    Induction: Intravenous  PONV Risk Score and Plan: 4 or greater and Ondansetron, Treatment may vary due to age or medical condition and Midazolam  Airway Management Planned: Oral ETT  Additional Equipment: Arterial line, CVP and Ultrasound Guidance Line Placement  Intra-op Plan:   Post-operative Plan: Possible Post-op intubation/ventilation  Informed Consent: I have reviewed the patients History and Physical, chart, labs and discussed the procedure including the risks, benefits and alternatives for the proposed anesthesia with the patient or authorized representative who has indicated his/her understanding and acceptance.       Plan Discussed with: CRNA  Anesthesia Plan Comments:        Anesthesia Quick Evaluation

## 2020-03-29 NOTE — Progress Notes (Signed)
Spoke with Lang Snow regarding SBP less than 90 and MAP below 65. Will give albumin. Okay to have MAP of 55-60.

## 2020-03-29 NOTE — Op Note (Signed)
    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  Ultrasound-guided cannulation of right internal jugular vein 2.  Ultrasound-guided cannulation of left internal jugular vein 3.  Ultrasound-guided placement of 7 French double-lumen central venous line and left internal jugular vein 4.  Intravascular ultrasound of bilateral femoral, bilateral common femoral, bilateral external iliac and bilateral common iliac veins and IVC 5.  IVC venogram 6.  Angio VAC and cleaner wire mechanical thrombectomy of IVC, bilateral common iliac, bilateral external iliac, bilateral common femoral and bilateral femoral veins 7.  Stent of IVC with 18 x 90 mm Wallstent 8.  Ultrasound-guided cannulation right common femoral vein 9.  Veno venous bypass for 58 minutes Anesthesia: General   Procedure: Please see Dr. Claretha Cooper full operative note for details of the procedure.  I was present for the duration of the procedure as to surgeons were required.  I helped perform mechanical thrombectomy using the cleaner wire.  I also performed intravascular ultrasound and vena cava stent deployment.     Theotis Burrow, M.D., [facility] [place] Office: [phone] Pager:  [phone]

## 2020-03-29 NOTE — Progress Notes (Signed)
  Progress Note    03/29/2020 8:49 AM 1 Day Post-Op  Subjective: No overnight issues  Vitals:   03/29/20 0840 03/29/20 0841  BP:    Pulse: (!) 59 61  Resp: 15 14  Temp:    SpO2: 96% 96%    Physical Exam: Awake alert and oriented  Respirations   CBC    Component Value Date/Time   WBC 6.6 03/29/2020 0615   RBC 3.90 03/29/2020 0615   HGB 11.7 (L) 03/29/2020 0615   HGB 13.3 12/17/2013 1538   HCT 36.7 03/29/2020 0615   HCT 40.6 12/17/2013 1538   PLT 143 (L) 03/29/2020 0615   PLT 219 12/17/2013 1538   MCV 94.1 03/29/2020 0615   MCV 90.3 12/17/2013 1538   MCH 30.0 03/29/2020 0615   MCHC 31.9 03/29/2020 0615   RDW 15.0 03/29/2020 0615   RDW 13.5 12/17/2013 1538   LYMPHSABS 1.4 03/26/2020 0537   LYMPHSABS 1.4 12/17/2013 1538   MONOABS 0.5 03/26/2020 0537   MONOABS 0.3 12/17/2013 1538   EOSABS 0.0 03/26/2020 0537   EOSABS 0.0 12/17/2013 1538   BASOSABS 0.0 03/26/2020 0537   BASOSABS 0.0 12/17/2013 1538    BMET    Component Value Date/Time   NA 137 03/29/2020 0615   NA 144 01/21/2018 1000   NA 142 12/17/2013 1538   K 5.2 (H) 03/29/2020 0615   K 4.1 12/17/2013 1538   CL 105 03/29/2020 0615   CL 106 12/07/2012 0838   CO2 26 03/29/2020 0615   CO2 28 12/17/2013 1538   GLUCOSE 182 (H) 03/29/2020 0615   GLUCOSE 123 12/17/2013 1538   GLUCOSE 119 (H) 12/07/2012 0838   BUN 22 (H) 03/29/2020 0615   BUN 12 01/21/2018 1000   BUN 14.1 12/17/2013 1538   CREATININE 0.87 03/29/2020 0615   CREATININE 0.96 10/27/2015 0904   CREATININE 0.8 12/17/2013 1538   CALCIUM 8.8 (L) 03/29/2020 0615   CALCIUM 9.7 12/17/2013 1538   GFRNONAA >60 03/29/2020 0615   GFRAA >60 03/29/2020 0615    INR    Component Value Date/Time   INR 1.5 (H) 03/21/2020 2143   INR 2.10 12/07/2012 1025     Intake/Output Summary (Last 24 hours) at 03/29/2020 0849 Last data filed at 03/29/2020 0800 Gross per 24 hour  Intake 1291.69 ml  Output 590 ml  Net 701.69 ml     Assessment/plan:  54 y.o.  female is s/p placement of bilateral popliteal lytic catheters.  Plan for mechanical thrombectomy and possible IVC filter removal today in the operating room.   Brinklee Cisse C. Donzetta Matters, MD [facility] [place] Office: [phone] Pager: [phone]  03/29/2020 8:49 AM

## 2020-03-29 NOTE — Progress Notes (Signed)
PROGRESS NOTE                                                                                                                                                                                                             Patient Demographics:    [name], is a 54 y.o. female, DOB - 07-28-1966, RWE:[id number]  Outpatient Primary MD for the patient is Glendale Chard, MD   Admit date - 03/21/2020   LOS - 7  Chief Complaint  Patient presents with   Hypotension   Back Pain       Brief Narrative: Patient is a 54 y.o. female with PMHx of DM-2, HLD, hypothyroidism, PE-s/p IVC filter in 2012, history of colon cancer-diagnosed with COVID-19 on 5/21-presented with fatigue/malaise, low back pain-found to have patchy airspace disease on CTA chest, MRI thoracic/lumbar spine showed T7 disc herniation-but also showed extensive clot in the IVC.  Subsequently a Doppler showed extensive DVT in the bilateral lower extremity.  She was started on IV heparin-vascular surgery was consulted.  See below for further details.  Significant Events: 6/1>> admit to [facility] for low back pain/weakness/malaise-found to have COVID-19 pneumonia on CT imaging.  Significant studies: 6/1>> CTA chest: No PE-multifocal patchy airspace opacities throughout both lungs. 6/1>> CT abdomen/pelvis: Hepatic steatosis, aortic atherosclerosis 6/2>> MRI thoracic spine: T7-8 central to left-sided disc herniation 6/2>> MRI lumbar spine: No significant disc pathology/no stenosis or neurocompression.  Thrombosis of the IVC, both iliac veins to the level of the IVC filter. 6/2>> lower extremity Doppler: Bilateral lower extremity extensive DVT.  Procedures: 6/8>>  #1: Ultrasound-guided access, bilateral popliteal vein  #2: Intravascular ultrasound, bilateral femoral, common femoral, external iliac, common iliac veins, and inferior vena cava #3: Placement of bilateral intravenous  infusion catheter for thrombolytic therapy into the vena cava.                         #4: Initiation of thrombolytic therapy (TPA into the inferior vena cava, common iliac, external iliac, common femoral, and femoral veins  COVID-19 medications: Steroids:6/1>>6/9 Remdesivir:6/1>>6/6  Antibiotics: Zithromax: 6/1>> 6/4 Rocephin: 6/1>> 6/4  Microbiology data: 6/1>> blood culture: Negative 6/2>> urine culture: Negative  Consults: VVS  DVT prophylaxis: IV heparin    Subjective:    Lakechia Nay today was lying comfortably in bed-no major issues overnight.  Assessment  & Plan :   Sepsis secondary to Covid 19 Viral pneumonia: Sepsis physiology has resolved-has completed a course of steroids/remdesivir.  Currently stable-not hypoxic.  Blood cultures remain negative.  Since not hypoxic-stop steroids.  Fever: afebrile  O2 requirements:  SpO2: 99 %   COVID-19 Labs: Recent Labs    03/27/20 1136 03/28/20 0517 03/29/20 0615  CRP 1.3* 0.6 1.8*       Component Value Date/Time   BNP 29.9 03/21/2020 2148    No results for input(s): PROCALCITON in the last 168 hours.  Lab Results  Component Value Date   SARSCOV2NAA POSITIVE (A) 03/10/2020    Prone/Incentive Spirometry: encouraged incentive spirometry use 3-4/hour.  Extensive bilateral lower extremity DVTs-involving bilateral iliac veins and extending to IVC up to the level of IVC filter: VVS following-s/p bilateral popliteal lytic catheter placement on 6/8-for mechanical thrombectomy/IVC filter removal today.  AKI: Likely hemodynamically mediated-resolved  DM-2 with uncontrolled hyperglycemia with mild DKA: Initially on insulin infusion-CBGs currently stable with 10 units of Lantus daily, SSI-follow and adjust.  Recent Labs    03/28/20 2036 03/28/20 2245 03/29/20 0755  GLUCAP 134* 135* 165*    T7-T8 disc herniation: Prior MD-Dr. Linton Ham already discussed MRI findings with Dr. Jenkins-recommendations were for  outpatient follow-up.  Hypothyroidism: Continue Synthroid.  TSH mildly elevated-not sure when her Synthroid dosing was adjusted last-stable for outpatient follow-up/adjustment with PCP.  HLD: Continue statin  Nutrition Problem: Nutrition Problem: Increased nutrient needs Etiology: acute illness(COVID) Signs/Symptoms: estimated needs Interventions: Glucerna shake  ABG:    Component Value Date/Time   HCO3 16.5 (L) 03/21/2020 2235   TCO2 18 (L) 03/21/2020 2235   ACIDBASEDEF 9.0 (H) 03/21/2020 2235   O2SAT 99.0 03/21/2020 2235    Vent Settings: N/A   Condition -Stable  Family Communication  : We will update family over the next few days-discussed with patient extensively this morning.  Code Status :  Full Code  Diet :  Diet Order            Diet NPO time specified  Diet effective midnight               Disposition Plan  : Status is: Inpatient  Remains inpatient appropriate because:Inpatient level of care appropriate due to severity of illness   Dispo: The patient is from: Home              Anticipated d/c is to: Home              Anticipated d/c date is: 2 days              Patient currently is not medically stable to d/c.  Barriers to discharge: Extensive bilateral DVT-with plans for mechanical thrombectomy today.  Antimicorbials  :    Anti-infectives (From admission, onward)   Start     Dose/Rate Route Frequency Ordered Stop   03/23/20 2200  azithromycin (ZITHROMAX) tablet 500 mg  Status:  Discontinued     500 mg Oral Daily at bedtime 03/23/20 0806 03/25/20 1233   03/23/20 1000  remdesivir 100 mg in sodium chloride 0.9 % 100 mL IVPB     100 mg 200 mL/hr over 30 Minutes Intravenous Daily 03/22/20 0110 03/26/20 1913   03/22/20 2200  azithromycin (ZITHROMAX) 500 mg in sodium chloride 0.9 % 250 mL IVPB  Status:  Discontinued     500 mg 250 mL/hr over 60 Minutes Intravenous Every 24 hours 03/22/20 0111 03/23/20 0806   03/22/20 2200  cefTRIAXone (ROCEPHIN)  1 g  in sodium chloride 0.9 % 100 mL IVPB  Status:  Discontinued     1 g 200 mL/hr over 30 Minutes Intravenous Every 24 hours 03/22/20 0111 03/25/20 1233   03/22/20 0200  remdesivir 200 mg in sodium chloride 0.9% 250 mL IVPB     200 mg 580 mL/hr over 30 Minutes Intravenous Once 03/22/20 0110 03/22/20 1900   03/22/20 0015  cefTRIAXone (ROCEPHIN) 1 g in sodium chloride 0.9 % 100 mL IVPB     1 g 200 mL/hr over 30 Minutes Intravenous  Once 03/22/20 0010 03/22/20 0103   03/21/20 2300  cefTRIAXone (ROCEPHIN) injection 1 g  Status:  Discontinued     1 g Intramuscular  Once 03/21/20 2253 03/22/20 0010   03/21/20 2300  azithromycin (ZITHROMAX) 500 mg in sodium chloride 0.9 % 250 mL IVPB     500 mg 250 mL/hr over 60 Minutes Intravenous  Once 03/21/20 2253 03/22/20 0129      Inpatient Medications  Scheduled Meds:  [MAR Hold] sodium chloride   Intravenous Once   [MAR Hold] vitamin C  500 mg Oral Daily   [MAR Hold] Chlorhexidine Gluconate Cloth  6 each Topical Daily   [MAR Hold] dexamethasone  4 mg Oral Daily   [MAR Hold] famotidine  20 mg Oral BID   [MAR Hold] feeding supplement (GLUCERNA SHAKE)  237 mL Oral TID BM   [MAR Hold] insulin aspart  0-15 Units Subcutaneous TID WC   [MAR Hold] insulin aspart  0-5 Units Subcutaneous QHS   [MAR Hold] insulin glargine  10 Units Subcutaneous Daily   [MAR Hold] Ipratropium-Albuterol  1 puff Inhalation Q6H   [MAR Hold] levothyroxine  100 mcg Oral Q0600   [MAR Hold] linagliptin  5 mg Oral Daily   [MAR Hold] simvastatin  10 mg Oral Daily   [MAR Hold] sodium chloride flush  3 mL Intravenous Q12H   [MAR Hold] sodium chloride flush  3 mL Intravenous Q12H   [MAR Hold] zinc sulfate  220 mg Oral Daily   Continuous Infusions:  sodium chloride 100 mL/hr at 03/28/20 0441   [MAR Hold] sodium chloride     alteplase (LIMB ISCHEMIA) 10 mg in normal saline (0.02 mg/mL) infusion 0.25 mg/hr (03/28/20 1951)   alteplase (LIMB ISCHEMIA) 10 mg in normal  saline (0.02 mg/mL) infusion 0.25 mg/hr (03/28/20 1951)   dextrose 5 % and 0.45% NaCl 30 mL/hr at 03/28/20 1333   heparin Stopped (03/29/20 1035)   PRN Meds:.[MAR Hold] sodium chloride, [MAR Hold] acetaminophen, [MAR Hold] chlorpheniramine-HYDROcodone, [MAR Hold] dextrose, [MAR Hold] guaiFENesin-dextromethorphan, heparin irrigation 6000 unit, [MAR Hold]  HYDROmorphone (DILAUDID) injection, [MAR Hold] loperamide, [MAR Hold]  morphine injection, [MAR Hold] ondansetron **OR** [MAR Hold] ondansetron (ZOFRAN) IV, [MAR Hold] oxyCODONE, [MAR Hold] senna-docusate, [MAR Hold] sodium chloride flush, [MAR Hold] zolpidem   Time Spent in minutes  25  See all Orders from today for further details   Oren Binet M.D on 03/29/2020 at 10:50 AM  To page go to [email or web address] - use universal password  [facility] -  Office  [phone]    Objective:   Vitals:   03/29/20 0839 03/29/20 0840 03/29/20 0841 03/29/20 0900  BP:    115/80  Pulse: (!) 58 (!) 59 61 61  Resp: 12 15 14 10   Temp:      TempSrc:      SpO2: 97% 96% 96% 99%  Weight:      Height:        Wt Readings from  Last 3 Encounters:  03/22/20 94.2 kg  02/15/20 91.2 kg  08/02/19 95.3 kg     Intake/Output Summary (Last 24 hours) at 03/29/2020 1050 Last data filed at 03/29/2020 0800 Gross per 24 hour  Intake 1227.69 ml  Output 670 ml  Net 557.69 ml     Physical Exam Gen Exam:Alert awake-not in any distress HEENT:atraumatic, normocephalic Chest: B/L clear to auscultation anteriorly CVS:S1S2 regular Abdomen:soft non tender, non distended Extremities:no edema Neurology: Non focal Skin: no rash   Data Review:    CBC Recent Labs  Lab 03/23/20 0548 03/23/20 0548 03/24/20 0834 03/24/20 0834 03/25/20 0506 03/26/20 0537 03/27/20 1136 03/28/20 0517 03/29/20 0615  WBC 14.6*   < > 9.5   < > 7.8 7.7 8.2 7.8 6.6  HGB 12.1   < > 11.8*   < > 11.3* 11.2* 11.5* 11.2* 11.7*  HCT 38.4   < > 36.4   < > 35.5* 36.0 36.1 35.6*  36.7  PLT 141*   < > 164   < > 161 183 167 177 143*  MCV 92.8   < > 91.9   < > 92.2 93.0 93.0 93.4 94.1  MCH 29.2   < > 29.8   < > 29.4 28.9 29.6 29.4 30.0  MCHC 31.5   < > 32.4   < > 31.8 31.1 31.9 31.5 31.9  RDW 13.8   < > 13.9   < > 13.9 14.0 14.4 14.4 15.0  LYMPHSABS 0.8  --  0.6*  --  0.8 1.4  --   --   --   MONOABS 0.2  --  0.1  --  0.2 0.5  --   --   --   EOSABS 0.0  --  0.0  --  0.0 0.0  --   --   --   BASOSABS 0.0  --  0.0  --  0.0 0.0  --   --   --    < > = values in this interval not displayed.    Chemistries  Recent Labs  Lab 03/23/20 1224 03/24/20 0834 03/25/20 0506 03/26/20 0537 03/27/20 1136 03/28/20 0517 03/29/20 0615  NA 137   < > 135 138 135 137 137  K 4.1   < > 4.4 4.3 4.1 4.4 5.2*  CL 103   < > 102 101 102 103 105  CO2 20*   < > 25 27 26 25 26   GLUCOSE 224*   < > 276* 229* 214* 164* 182*  BUN 22*   < > 25* 26* 26* 27* 22*  CREATININE 0.85   < > 0.79 0.95 0.92 0.89 0.87  CALCIUM 8.3*   < > 8.7* 9.3 9.1 9.1 8.8*  MG 2.0  --   --   --   --   --   --   AST 23   < > 21 21 20 18 31   ALT 19   < > 25 24 30 28  35  ALKPHOS 74   < > 75 68 73 68 82  BILITOT 0.9   < > 0.7 0.6 0.7 0.7 1.6*   < > = values in this interval not displayed.   ------------------------------------------------------------------------------------------------------------------ No results for input(s): CHOL, HDL, LDLCALC, TRIG, CHOLHDL, LDLDIRECT in the last 72 hours.  Lab Results  Component Value Date   HGBA1C 8.6 (H) 03/23/2020   ------------------------------------------------------------------------------------------------------------------ No results for input(s): TSH, T4TOTAL, T3FREE, THYROIDAB in the last 72 hours.  Invalid input(s): FREET3 ------------------------------------------------------------------------------------------------------------------ No results for  input(s): VITAMINB12, FOLATE, FERRITIN, TIBC, IRON, RETICCTPCT in the last 72 hours.  Coagulation profile No  results for input(s): INR, PROTIME in the last 168 hours.  No results for input(s): DDIMER in the last 72 hours.  Cardiac Enzymes No results for input(s): CKMB, TROPONINI, MYOGLOBIN in the last 168 hours.  Invalid input(s): CK ------------------------------------------------------------------------------------------------------------------    Component Value Date/Time   BNP 29.9 03/21/2020 2148    Micro Results Recent Results (from the past 240 hour(s))  Blood Culture (routine x 2)     Status: None   Collection Time: 03/21/20 10:00 PM   Specimen: BLOOD  Result Value Ref Range Status   Specimen Description BLOOD LEFT ANTECUBITAL  Final   Special Requests   Final    BOTTLES DRAWN AEROBIC AND ANAEROBIC Blood Culture results may not be optimal due to an inadequate volume of blood received in culture bottles   Culture   Final    NO GROWTH 5 DAYS Performed at [facility], [place] [address] [place], [place] [address]    Report Status 03/26/2020 FINAL  Final  Blood Culture (routine x 2)     Status: None   Collection Time: 03/21/20 11:41 PM   Specimen: BLOOD LEFT HAND  Result Value Ref Range Status   Specimen Description BLOOD LEFT HAND  Final   Special Requests   Final    BOTTLES DRAWN AEROBIC AND ANAEROBIC Blood Culture adequate volume   Culture   Final    NO GROWTH 5 DAYS Performed at [facility], [place] [address] [place], [place] [address]    Report Status 03/27/2020 FINAL  Final  Urine culture     Status: None   Collection Time: 03/22/20  1:48 AM   Specimen: In/Out Cath Urine  Result Value Ref Range Status   Specimen Description IN/OUT CATH URINE  Final   Special Requests NONE  Final   Culture   Final    NO GROWTH Performed at [facility], [place] [address] [place], [place] [address]    Report Status 03/22/2020 FINAL  Final  MRSA PCR Screening     Status: None   Collection Time: 03/22/20 12:32 PM   Specimen: Nasal Mucosa; Nasopharyngeal  Result Value  Ref Range Status   MRSA by PCR NEGATIVE NEGATIVE Final    Comment:        The GeneXpert MRSA Assay (FDA approved for NASAL specimens only), is one component of a comprehensive MRSA colonization surveillance program. It is not intended to diagnose MRSA infection nor to guide or monitor treatment for MRSA infections. Performed at [facility], [place] [address] [place], [place] [address]     Radiology Reports CT Angio Chest PE W and/or Wo Contrast  Result Date: 03/22/2020 CLINICAL DATA:  Severe back pain hypotension and shallow EXAM: CT ANGIOGRAPHY CHEST WITH CONTRAST TECHNIQUE: Multidetector CT imaging of the chest was performed using the standard protocol during bolus administration of intravenous contrast. Multiplanar CT image reconstructions and MIPs were obtained to evaluate the vascular anatomy. CONTRAST:  79mL OMNIPAQUE IOHEXOL 350 MG/ML SOLN COMPARISON:  None. FINDINGS: Cardiovascular: There is a optimal opacification of the pulmonary arteries. There is no central,segmental, or subsegmental filling defects within the pulmonary arteries. The heart is normal in size. No pericardial effusion or thickening. No evidence right heart strain. There is normal three-vessel brachiocephalic anatomy without proximal stenosis. The thoracic aorta is normal in appearance. Mediastinum/Nodes: No hilar, mediastinal, or axillary adenopathy. Thyroid gland, trachea,  and esophagus demonstrate no significant findings. Lungs/Pleura: Multifocal patchy ground-glass opacities are seen predominantly within the periphery of both lungs. No pleural effusion or pneumothorax is seen. Upper Abdomen: No acute abnormalities present in the visualized portions of the upper abdomen. Musculoskeletal: No chest wall abnormality. No acute or significant osseous findings. Review of the MIP images confirms the above findings. Abdomen/pelvis: Hepatobiliary: There is diffuse low density seen throughout the liver parenchyma. The main  portal vein is patent. No evidence of calcified gallstones, gallbladder wall thickening or biliary dilatation. Pancreas: Unremarkable. No pancreatic ductal dilatation or surrounding inflammatory changes. Spleen: Normal in size without focal abnormality. Adrenals/Urinary Tract: Both adrenal glands appear normal. The kidneys and collecting system appear normal without evidence of urinary tract calculus or hydronephrosis. Bladder is unremarkable. Stomach/Bowel: The stomach, small bowel, and colon are normal in appearance. No inflammatory changes, wall thickening, or obstructive findings. There appears to be a surgical anastomosis at the sigmoid rectal junction. Mild presacral soft tissue density is again identified. Vascular/Lymphatic: There are no enlarged mesenteric, retroperitoneal, or pelvic lymph nodes. An infrarenal IVC filter is seen at approximately the L3 level. There is scattered aortic and bi-iliac calcifications noted. Reproductive: The uterus and adnexa are unremarkable. Other: No evidence of abdominal wall mass or hernia. Musculoskeletal: No acute or significant osseous findings. IMPRESSION: No central, segmental, or subsegmental pulmonary embolism. Multifocal patchy airspace opacities throughout both lungs, likely consistent with multifocal pneumonia. Hepatic steatosis Aortic Atherosclerosis (ICD10-I70.0). Electronically Signed   By: Prudencio Pair M.D.   On: 03/22/2020 00:07   MR THORACIC SPINE W WO CONTRAST  Result Date: 03/22/2020 CLINICAL DATA:  Bilateral lower extremity pain and numbness with urinary retention. Diabetes. History of colon cancer. EXAM: MRI THORACIC WITHOUT AND WITH CONTRAST TECHNIQUE: Multiplanar and multiecho pulse sequences of the thoracic spine were obtained without and with intravenous contrast. CONTRAST:  9.77mL GADAVIST GADOBUTROL 1 MMOL/ML IV SOLN COMPARISON:  CT chest done yesterday. FINDINGS: MRI THORACIC SPINE FINDINGS Alignment:  Normal Vertebrae: Normal. No fracture or  primary bone lesion. No metastatic disease. Insignificant small hemangioma within the T11 vertebral body. Cord: No primary cord pathology. See below regarding stenosis at T7-8. Paraspinal and other soft tissues: Negative Disc levels: No abnormality at T5-6 or above. T6-7: Small right paracentral disc protrusion indents the thecal sac slightly but does not cause apparent neural compression. T7-8: Left posterolateral disc herniation effaces the ventral subarachnoid space and indents the left side of the cord. This could be symptomatic. T8-9 through T12-L1: Normal. 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 Ample subarachnoid space is present dorsal to the cord at this level. None the less, this could possibly be symptomatic. Small right paracentral disc herniation at T6-7 without apparent neural compression. Electronically Signed   By: Nelson Chimes M.D.   On: 03/22/2020 12:32   MR Lumbar Spine W Wo Contrast  Result Date: 03/22/2020 CLINICAL DATA:  Back pain.  Urinary retention.  Diabetes. EXAM: MRI LUMBAR SPINE WITHOUT AND WITH CONTRAST TECHNIQUE: Multiplanar and multiecho pulse sequences of the lumbar spine were obtained without and with intravenous contrast. CONTRAST:  9.26mL GADAVIST GADOBUTROL 1 MMOL/ML IV SOLN COMPARISON:  None. FINDINGS: Segmentation:  5 lumbar type vertebral bodies. Alignment:  Slightly exaggerated lower lumbar lordosis. Vertebrae:  No fracture or primary bone lesion. Conus medullaris and cauda equina: Conus extends to the L1 level. Conus and cauda equina appear normal. Paraspinal and  other soft tissues: There appears be thrombosis of the IVC and both iliac veins up to the level of the IVC filter. Flow is restored in the IVC by inflow from the renal veins. Disc levels: No abnormality from L1-2 through L2-3. L3-4: No disc abnormality.  Mild facet hypertrophy.  No stenosis. L4-5:  No disc abnormality. Mild to moderate facet hypertrophy. No stenosis. L5-S1: Minimal disc bulge.  Mild facet hypertrophy.  No stenosis. IMPRESSION: No significant disc pathology. No stenosis or neural compression in the lumbar region. Patient has exaggerated lumbar lordosis. 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 Flow is restored in the IVC above that by inflow from the renal veins. I am in the process of calling this report. Electronically Signed   By: Nelson Chimes M.D.   On: 03/22/2020 12:46   CT ABDOMEN PELVIS W CONTRAST  Result Date: 03/22/2020 CLINICAL DATA:  Severe back pain hypotension and shallow EXAM: CT ANGIOGRAPHY CHEST WITH CONTRAST TECHNIQUE: Multidetector CT imaging of the chest was performed using the standard protocol during bolus administration of intravenous contrast. Multiplanar CT image reconstructions and MIPs were obtained to evaluate the vascular anatomy. CONTRAST:  61mL OMNIPAQUE IOHEXOL 350 MG/ML SOLN COMPARISON:  None. FINDINGS: Cardiovascular: There is a optimal opacification of the pulmonary arteries. There is no central,segmental, or subsegmental filling defects within the pulmonary arteries. The heart is normal in size. No pericardial effusion or thickening. No evidence right heart strain. There is normal three-vessel brachiocephalic anatomy without proximal stenosis. The thoracic aorta is normal in appearance. Mediastinum/Nodes: No hilar, mediastinal, or axillary adenopathy. Thyroid gland, trachea, and esophagus demonstrate no significant findings. Lungs/Pleura: Multifocal patchy ground-glass opacities are seen predominantly within the periphery of both lungs. No pleural effusion or pneumothorax is seen. Upper Abdomen: No acute abnormalities present in the visualized portions of the upper  abdomen. Musculoskeletal: No chest wall abnormality. No acute or significant osseous findings. Review of the MIP images confirms the above findings. Abdomen/pelvis: Hepatobiliary: There is diffuse low density seen throughout the liver parenchyma. The main portal vein is patent. No evidence of calcified gallstones, gallbladder wall thickening or biliary dilatation. Pancreas: Unremarkable. No pancreatic ductal dilatation or surrounding inflammatory changes. Spleen: Normal in size without focal abnormality. Adrenals/Urinary Tract: Both adrenal glands appear normal. The kidneys and collecting system appear normal without evidence of urinary tract calculus or hydronephrosis. Bladder is unremarkable. Stomach/Bowel: The stomach, small bowel, and colon are normal in appearance. No inflammatory changes, wall thickening, or obstructive findings. There appears to be a surgical anastomosis at the sigmoid rectal junction. Mild presacral soft tissue density is again identified. Vascular/Lymphatic: There are no enlarged mesenteric, retroperitoneal, or pelvic lymph nodes. An infrarenal IVC filter is seen at approximately the L3 level. There is scattered aortic and bi-iliac calcifications noted. Reproductive: The uterus and adnexa are unremarkable. Other: No evidence of abdominal wall mass or hernia. Musculoskeletal: No acute or significant osseous findings. IMPRESSION: No central, segmental, or subsegmental pulmonary embolism. Multifocal patchy airspace opacities throughout both lungs, likely consistent with multifocal pneumonia. Hepatic steatosis Aortic Atherosclerosis (ICD10-I70.0). Electronically Signed   By: Prudencio Pair M.D.   On: 03/22/2020 00:07   DG Chest Port 1 View  Result Date: 03/21/2020 CLINICAL DATA:  54 year old female with weakness and hypotension. EXAM: PORTABLE CHEST 1 VIEW COMPARISON:  Chest  radiograph dated 03/25/2016 FINDINGS: Faint bilateral peripheral and subpleural densities, right greater left concerning  for pneumonia, possibly atypical or viral in etiology including COVID-19. Clinical correlation is recommended. No focal consolidation, pleural effusion, pneumothorax. The cardiac silhouette is within limits. No acute osseous pathology. IMPRESSION: Findings concerning for pneumonia. Electronically Signed   By: Anner Crete M.D.   On: 03/21/2020 22:17   VAS [place] LOWER EXTREMITY VENOUS (DVT)  Result Date: 03/22/2020  Lower Venous DVTStudy Indications: Hx DVT and IVC filter, now with COVID and elevated D dimer.  Comparison Study: 06/07/15 negative Performing Technologist: June Leap RDMS, RVT  Examination Guidelines: A complete evaluation includes B-mode imaging, spectral Doppler, color Doppler, and power Doppler as needed of all accessible portions of each vessel. Bilateral testing is considered an integral part of a complete examination. Limited examinations for reoccurring indications may be performed as noted. The reflux portion of the exam is performed with the patient in reverse Trendelenburg.  +---------+---------------+---------+-----------+----------+-----------------+  RIGHT     Compressibility Phasicity Spontaneity Properties Thrombus Aging     +---------+---------------+---------+-----------+----------+-----------------+  CFV       None            No        No                     Acute              +---------+---------------+---------+-----------+----------+-----------------+  SFJ       None                                             Acute              +---------+---------------+---------+-----------+----------+-----------------+  FV Prox   None                                             Acute              +---------+---------------+---------+-----------+----------+-----------------+  FV Mid    None                                             Acute              +---------+---------------+---------+-----------+----------+-----------------+  FV Distal None                                              Acute              +---------+---------------+---------+-----------+----------+-----------------+  PFV       None                                             Acute              +---------+---------------+---------+-----------+----------+-----------------+  POP       None            No  No                     Acute              +---------+---------------+---------+-----------+----------+-----------------+  PTV       None                                             Acute              +---------+---------------+---------+-----------+----------+-----------------+  PERO      None                                             Acute              +---------+---------------+---------+-----------+----------+-----------------+  Gastroc   None                                             Acute              +---------+---------------+---------+-----------+----------+-----------------+  GSV       None                                             Acute              +---------+---------------+---------+-----------+----------+-----------------+  Ex Iliac                  No        No                     Age Indeterminate  +---------+---------------+---------+-----------+----------+-----------------+  Com Iliac                                                  Not visualized     +---------+---------------+---------+-----------+----------+-----------------+   +---------+---------------+---------+-----------+----------+-----------------+  LEFT      Compressibility Phasicity Spontaneity Properties Thrombus Aging     +---------+---------------+---------+-----------+----------+-----------------+  CFV       None            No        No                     Acute              +---------+---------------+---------+-----------+----------+-----------------+  SFJ       None                                             Acute              +---------+---------------+---------+-----------+----------+-----------------+  FV Prox   None  Acute              +---------+---------------+---------+-----------+----------+-----------------+  FV Mid    None                                             Acute              +---------+---------------+---------+-----------+----------+-----------------+  FV Distal None                                             Acute              +---------+---------------+---------+-----------+----------+-----------------+  PFV       None                                             Acute              +---------+---------------+---------+-----------+----------+-----------------+  POP       None            No        No                     Acute              +---------+---------------+---------+-----------+----------+-----------------+  PTV       None                                             Acute              +---------+---------------+---------+-----------+----------+-----------------+  PERO      None                                             Acute              +---------+---------------+---------+-----------+----------+-----------------+  Gastroc   None                                             Acute              +---------+---------------+---------+-----------+----------+-----------------+  GSV       None                                             Acute              +---------+---------------+---------+-----------+----------+-----------------+  Ex Iliac                  No        No                     Age Indeterminate  +---------+---------------+---------+-----------+----------+-----------------+  Com Iliac  Not visualized     +---------+---------------+---------+-----------+----------+-----------------+     Summary: RIGHT: - Findings consistent with acute deep vein thrombosis involving the right common femoral vein, SF junction, right femoral vein, right proximal profunda vein, right popliteal vein, right posterior tibial veins, right  peroneal veins, and right gastrocnemius veins. - Findings consistent with acute superficial vein thrombosis involving the right great saphenous vein. - Thrombosis extends into the Right Iliac vein and IVC up to the level of the filter placement.  LEFT: - Findings consistent with acute deep vein thrombosis involving the left common femoral vein, SF junction, left femoral vein, left proximal profunda vein, left popliteal vein, left posterior tibial veins, left peroneal veins, and left gastrocnemius veins. - Findings consistent with acute superficial vein thrombosis involving the left great saphenous vein. - Thrombosis extends into the Left Iliac vein.  *See table(s) above for measurements and observations. Electronically signed by Curt Jews MD on 03/22/2020 at 5:19:17 PM.    Final    HYBRID OR IMAGING ([facility])  Result Date: 03/29/2020 There is no interpretation for this exam.  This order is for images obtained during a surgical procedure.  Please See "Surgeries" Tab for more information regarding the procedure.   HYBRID OR IMAGING ([facility] ONLY)  Result Date: 03/28/2020 There is no interpretation for this exam.  This order is for images obtained during a surgical procedure.  Please See "Surgeries" Tab for more information regarding the procedure.

## 2020-03-29 NOTE — Anesthesia Postprocedure Evaluation (Signed)
Anesthesia Post Note  Patient: [name]  Procedure(s) Performed: [facility] MECHANICAL THROMBECTOMY (N/A ) THROMBECTOMY ILIAC ARTERY MECHANICAL THROMBECTOMY (N/A ) VENO VENOUS BYPASS (N/A ) Intravascular Ultrasound Vena Cava Filter Removal Inferior Vena Cava WallStent Graft Insertion (N/A Abdomen)     Patient location during evaluation: SICU Anesthesia Type: General Level of consciousness: sedated Pain management: pain level controlled Vital Signs Assessment: post-procedure vital signs reviewed and stable Respiratory status: patient remains intubated per anesthesia plan Cardiovascular status: stable Postop Assessment: no apparent nausea or vomiting Anesthetic complications: no    Last Vitals:  Vitals:   03/29/20 1511 03/29/20 1704  BP:  96/72  Pulse:  77  Resp: 16 (!) 21  Temp:  36.5 C  SpO2:  100%    Last Pain:  Vitals:   03/29/20 1704  TempSrc: Oral  PainSc:                  Effie Berkshire

## 2020-03-29 NOTE — Transfer of Care (Addendum)
Immediate Anesthesia Transfer of Care Note  Patient: [name]  Procedure(s) Performed: [facility] MECHANICAL THROMBECTOMY (N/A ) THROMBECTOMY ILIAC ARTERY MECHANICAL THROMBECTOMY (N/A ) VENO VENOUS BYPASS (N/A ) Intravascular Ultrasound Vena Cava Filter Removal Inferior Vena Cava WallStent Graft Insertion (N/A Abdomen)  Patient Location: ICU  Anesthesia Type:General  Level of Consciousness: drowsy  Airway & Oxygen Therapy: Patient Spontanous Breathing and Patient connected to face mask oxygen  Post-op Assessment: Report given to RN and Post -op Vital signs reviewed and stable. 4th unit PRBC finishing.   Post vital signs: Reviewed and stable  Last Vitals:  Vitals Value Taken Time  BP 139/81   Temp    Pulse 78   Resp 17 03/29/20 1516  SpO2 100   Vitals shown include unvalidated device data.  Last Pain:  Vitals:   03/29/20 0800  TempSrc:   PainSc: 7       Patients Stated Pain Goal: 7 (88/28/00 3491)  Complications: No apparent anesthesia complications

## 2020-03-29 NOTE — Progress Notes (Signed)
Late Entry  Patient returned from the OR at 1515. CRNA was finishing last unit of PRBC upon arrival. Vitals stable. Pt on NRB mask at 15L. Able to open eyes, answer orientation questions, and follow commands. Moves all extremities. Doppler pulses present on bilateral feet - assessed upon arrival to unit, at 1530, and at 1545.  At 1600, RN was unable to locate DP/PT pulses on either foot. RN left room, removed CAPR, and reentered room with N95 mask - still unable to locate pulses. Another RN then attempted to locate pulses but was not successful. Dr. Carlis Abbott was paged and notified. Dr. Carlis Abbott to the unit around 1700 and assessed patient. After bilateral compression wraps were loosened on legs, pulses were now able to heard with a doppler. Will continue to closely monitor.  Joellen Jersey, RN   Patient's husband called (per patient's request) and notified of updates at 1800.

## 2020-03-29 NOTE — Progress Notes (Signed)
ANTICOAGULATION CONSULT NOTE - Follow Up Consult  Pharmacy Consult for Heparin Indication: Clotted IVC filter and BL DVT's  Allergies  Allergen Reactions  . Metformin And Related Other (See Comments)    Chest pain  . Penicillins Hives    Patient Measurements: Height: 5\' 11"  (180.3 cm) Weight: 94.2 kg (207 lb 10.8 oz) IBW/kg (Calculated) : 70.8 Heparin Dosing Weight: 90.2 kg  Vital Signs: Temp: 98.1 F (36.7 C) (06/09 0000) Temp Source: Oral (06/09 0000) BP: 105/73 (06/09 0600) Pulse Rate: 59 (06/09 0600)  Labs: Recent Labs    03/27/20 1136 03/27/20 1136 03/28/20 0517 03/28/20 1357 03/29/20 0615  HGB 11.5*   < > 11.2*  --  11.7*  HCT 36.1  --  35.6*  --  36.7  PLT 167  --  177  --  143*  HEPARINUNFRC 0.52   < > 0.76* 0.38 0.22*  CREATININE 0.92  --  0.89  --  0.87   < > = values in this interval not displayed.    Estimated Creatinine Clearance: 94.7 mL/min (by C-G formula based on SCr of 0.87 mg/dL).  Assessment: 31 yof presenting with occluded IVC filter, extensive DVT of bilateral lower extremities. Now s/p lytic catheter placement 6/8 with plan for angio [facility] mechanical thrombectomy on 6/9. Patient continuing on heparin post-procedure, now targeting lower heparin level goal 0.2-0.5 with patient continuing on alteplase running intra-catheter bilaterally at 0.25 mg/hr each.  Heparin level at goal at 0.22 on rate of 800 units/hr. CBC stable. No bleeding or issues with infusion per discussion with RN. Fibrinogen 243. Continues on Alteplase therapy.   Goal of Therapy:  Heparin level 0.2-0.5 units/ml post-op on lytic therapy Monitor platelets by anticoagulation protocol: Yes   Plan:  Continue heparin at 800 units/hr post-op per Vascular Continue alteplase per Vascular, infusing bilaterally intra-catheter, each at 0.25 mg/hr Check heparin level/CBC/fibrinogen Q6h x 4 post-procedure Monitor for s/sx bleeding F/u plan for angio [facility] mechanical thrombectomy 6/9  Erin Hearing PharmD., BCPS Clinical Pharmacist 03/29/2020 8:02 AM

## 2020-03-30 LAB — COMPREHENSIVE METABOLIC PANEL
ALT: 22 U/L (ref 0–44)
AST: 16 U/L (ref 15–41)
Albumin: 2.5 g/dL — ABNORMAL LOW (ref 3.5–5.0)
Alkaline Phosphatase: 40 U/L (ref 38–126)
Anion gap: 8 (ref 5–15)
BUN: 21 mg/dL — ABNORMAL HIGH (ref 6–20)
CO2: 21 mmol/L — ABNORMAL LOW (ref 22–32)
Calcium: 7.7 mg/dL — ABNORMAL LOW (ref 8.9–10.3)
Chloride: 106 mmol/L (ref 98–111)
Creatinine, Ser: 0.87 mg/dL (ref 0.44–1.00)
GFR calc Af Amer: >60 mL/min (ref >60)
GFR calc non Af Amer: >60 mL/min (ref >60)
Glucose, Bld: 211 mg/dL — ABNORMAL HIGH (ref 70–99)
Potassium: 4.7 mmol/L (ref 3.5–5.1)
Sodium: 135 mmol/L (ref 135–145)
Total Bilirubin: 1.1 mg/dL (ref 0.3–1.2)
Total Protein: 4.2 g/dL — ABNORMAL LOW (ref 6.5–8.1)

## 2020-03-30 LAB — TYPE AND SCREEN
ABO/RH(D): A POS
Antibody Screen: NEGATIVE
Unit division: 0
Unit division: 0
Unit division: 0
Unit division: 0

## 2020-03-30 LAB — CBC WITH DIFFERENTIAL/PLATELET
Abs Immature Granulocytes: 0.1 10*3/uL — ABNORMAL HIGH (ref 0.00–0.07)
Basophils Absolute: 0 10*3/uL (ref 0.0–0.1)
Basophils Relative: 0 %
Eosinophils Absolute: 0 10*3/uL (ref 0.0–0.5)
Eosinophils Relative: 0 %
HCT: 25.6 % — ABNORMAL LOW (ref 36.0–46.0)
Hemoglobin: 8.8 g/dL — ABNORMAL LOW (ref 12.0–15.0)
Immature Granulocytes: 1 %
Lymphocytes Relative: 11 %
Lymphs Abs: 1.1 10*3/uL (ref 0.7–4.0)
MCH: 29.2 pg (ref 26.0–34.0)
MCHC: 34.4 g/dL (ref 30.0–36.0)
MCV: 85 fL (ref 80.0–100.0)
Monocytes Absolute: 0.6 10*3/uL (ref 0.1–1.0)
Monocytes Relative: 6 %
Neutro Abs: 8.4 10*3/uL — ABNORMAL HIGH (ref 1.7–7.7)
Neutrophils Relative %: 82 %
Platelets: 66 10*3/uL — ABNORMAL LOW (ref 150–400)
RBC: 3.01 MIL/uL — ABNORMAL LOW (ref 3.87–5.11)
RDW: 17 % — ABNORMAL HIGH (ref 11.5–15.5)
WBC: 10.2 10*3/uL (ref 4.0–10.5)
nRBC: 0 % (ref 0.0–0.2)

## 2020-03-30 LAB — BPAM RBC
Blood Product Expiration Date: 202106242359
Blood Product Expiration Date: 202106242359
Blood Product Expiration Date: 202106282359
Blood Product Expiration Date: 202106282359
ISSUE DATE / TIME: 202106091058
ISSUE DATE / TIME: 202106091058
ISSUE DATE / TIME: 202106091356
ISSUE DATE / TIME: 202106091356
Unit Type and Rh: 6200
Unit Type and Rh: 6200
Unit Type and Rh: 6200
Unit Type and Rh: 6200

## 2020-03-30 LAB — HEPARIN LEVEL (UNFRACTIONATED): Heparin Unfractionated: 0.2 IU/mL — ABNORMAL LOW (ref 0.30–0.70)

## 2020-03-30 LAB — BASIC METABOLIC PANEL WITH GFR
Anion gap: 7 (ref 5–15)
BUN: 15 mg/dL (ref 6–20)
CO2: 22 mmol/L (ref 22–32)
Calcium: 7.6 mg/dL — ABNORMAL LOW (ref 8.9–10.3)
Chloride: 107 mmol/L (ref 98–111)
Creatinine, Ser: 0.7 mg/dL (ref 0.44–1.00)
GFR calc Af Amer: >60 mL/min
GFR calc non Af Amer: >60 mL/min
Glucose, Bld: 173 mg/dL — ABNORMAL HIGH (ref 70–99)
Potassium: 4 mmol/L (ref 3.5–5.1)
Sodium: 136 mmol/L (ref 135–145)

## 2020-03-30 LAB — APTT
aPTT: 100 seconds — ABNORMAL HIGH (ref 24–36)
aPTT: 83 s — ABNORMAL HIGH (ref 24–36)

## 2020-03-30 LAB — CBC
HCT: 28.8 % — ABNORMAL LOW (ref 36.0–46.0)
Hemoglobin: 10.2 g/dL — ABNORMAL LOW (ref 12.0–15.0)
MCH: 29.6 pg (ref 26.0–34.0)
MCHC: 35.4 g/dL (ref 30.0–36.0)
MCV: 83.5 fL (ref 80.0–100.0)
Platelets: 69 10*3/uL — ABNORMAL LOW (ref 150–400)
RBC: 3.45 MIL/uL — ABNORMAL LOW (ref 3.87–5.11)
RDW: 16.5 % — ABNORMAL HIGH (ref 11.5–15.5)
WBC: 12.6 10*3/uL — ABNORMAL HIGH (ref 4.0–10.5)
nRBC: 0.2 % (ref 0.0–0.2)

## 2020-03-30 LAB — GLUCOSE, CAPILLARY
Glucose-Capillary: 113 mg/dL — ABNORMAL HIGH (ref 70–99)
Glucose-Capillary: 116 mg/dL — ABNORMAL HIGH (ref 70–99)
Glucose-Capillary: 137 mg/dL — ABNORMAL HIGH (ref 70–99)
Glucose-Capillary: 170 mg/dL — ABNORMAL HIGH (ref 70–99)
Glucose-Capillary: 187 mg/dL — ABNORMAL HIGH (ref 70–99)

## 2020-03-30 MED ORDER — SODIUM CHLORIDE 0.9 % IV BOLUS
250.0000 mL | INTRAVENOUS | Status: DC
  Administered 2020-03-30 (×2): 250 mL via INTRAVENOUS

## 2020-03-30 MED ORDER — ARGATROBAN 50 MG/50ML IV SOLN
0.5000 ug/kg/min | INTRAVENOUS | Status: DC
  Administered 2020-03-30 – 2020-04-02 (×6): 0.5 ug/kg/min via INTRAVENOUS
  Filled 2020-03-30 (×10): qty 50

## 2020-03-30 MED ORDER — ADULT MULTIVITAMIN W/MINERALS CH
1.0000 | ORAL_TABLET | Freq: Every day | ORAL | Status: DC
  Administered 2020-03-30 – 2020-04-06 (×8): 1 via ORAL
  Filled 2020-03-30 (×8): qty 1

## 2020-03-30 MED ORDER — SODIUM CHLORIDE 0.9 % IV BOLUS: 250.0000 mL | INTRAVENOUS | Status: DC | PRN

## 2020-03-30 MED ORDER — ENSURE ENLIVE PO LIQD: 237.0000 mL | Freq: Three times a day (TID) | ORAL | Status: DC

## 2020-03-30 MED ORDER — ACETAMINOPHEN 325 MG PO TABS
650.0000 mg | ORAL_TABLET | Freq: Four times a day (QID) | ORAL | Status: DC | PRN
  Administered 2020-04-01: 650 mg via ORAL
  Filled 2020-03-30: qty 2

## 2020-03-30 MED ORDER — ARGATROBAN 50 MG/50ML IV SOLN
0.7500 ug/kg/min | INTRAVENOUS | Status: DC
  Administered 2020-03-30: 0.75 ug/kg/min via INTRAVENOUS
  Filled 2020-03-30 (×2): qty 50

## 2020-03-30 MED ORDER — NOREPINEPHRINE 4 MG/250ML-% IV SOLN
0.0000 ug/min | INTRAVENOUS | Status: DC
  Administered 2020-03-30: 4 ug/min via INTRAVENOUS

## 2020-03-30 MED ORDER — NOREPINEPHRINE 16 MG/250ML-% IV SOLN: 0.0000 ug/min | INTRAVENOUS | Status: DC

## 2020-03-30 MED ORDER — GLUCERNA SHAKE PO LIQD
237.0000 mL | Freq: Three times a day (TID) | ORAL | Status: DC
  Administered 2020-03-30 – 2020-04-02 (×8): 237 mL via ORAL

## 2020-03-30 MED ORDER — SODIUM CHLORIDE 0.9 % IV SOLN
Freq: Once | INTRAVENOUS | Status: AC
  Administered 2020-03-30: 1000 mL via INTRAVENOUS

## 2020-03-30 NOTE — Progress Notes (Signed)
°  °  Patient evaluated at bedside.  She is now off, precautions.  Abdomen with pain and mild distention has not been able to take p.o.  She is to at least get to the bedside.  Her legs demonstrate good improvement in swelling has remained edematous this is likely due to RV hematoma.  She has responded to IV fluid is now off pressors.  HIT antibody panel pending.  Furman Trentman C. Donzetta Matters, MD [facility] Office: [phone] Pager: [phone]

## 2020-03-30 NOTE — Progress Notes (Signed)
  Progress Note    03/30/2020 8:13 AM 1 Day Post-Op  Subjective:  Still having leg swelling, abdomen having some pain  Vitals:   03/30/20 0645 03/30/20 0700  BP:    Pulse: 76 80  Resp: 16 18  Temp:    SpO2: [name]% [name]%    Physical Exam: aaox3 Abdomen is soft, mild distension Bilateral legs are softer  CBC    Component Value Date/Time   WBC 12.6 (H) 03/30/2020 0241   RBC 3.45 (L) 03/30/2020 0241   HGB 10.2 (L) 03/30/2020 0241   HGB 13.3 12/17/2013 1538   HCT 28.8 (L) 03/30/2020 0241   HCT 40.6 12/17/2013 1538   PLT 69 (L) 03/30/2020 0241   PLT 219 12/17/2013 1538   MCV 83.5 03/30/2020 0241   MCV 90.3 12/17/2013 1538   MCH 29.6 03/30/2020 0241   MCHC 35.4 03/30/2020 0241   RDW 16.5 (H) 03/30/2020 0241   RDW 13.5 12/17/2013 1538   LYMPHSABS 1.4 03/29/2020 1614   LYMPHSABS 1.4 12/17/2013 1538   MONOABS 1.0 03/29/2020 1614   MONOABS 0.3 12/17/2013 1538   EOSABS 0.0 03/29/2020 1614   EOSABS 0.0 12/17/2013 1538   BASOSABS 0.0 03/29/2020 1614   BASOSABS 0.0 12/17/2013 1538    BMET    Component Value Date/Time   NA 135 03/30/2020 0241   NA 144 01/21/2018 1000   NA 142 12/17/2013 1538   K 4.7 03/30/2020 0241   K 4.1 12/17/2013 1538   CL 106 03/30/2020 0241   CL 106 12/07/2012 0838   CO2 21 (L) 03/30/2020 0241   CO2 28 12/17/2013 1538   GLUCOSE 211 (H) 03/30/2020 0241   GLUCOSE 123 12/17/2013 1538   GLUCOSE 119 (H) 12/07/2012 0838   BUN 21 (H) 03/30/2020 0241   BUN 12 01/21/2018 1000   BUN 14.1 12/17/2013 1538   CREATININE 0.87 03/30/2020 0241   CREATININE 0.96 10/27/2015 0904   CREATININE 0.8 12/17/2013 1538   CALCIUM 7.7 (L) 03/30/2020 0241   CALCIUM 9.7 12/17/2013 1538   GFRNONAA >60 03/30/2020 0241   GFRAA >60 03/30/2020 0241    INR    Component Value Date/Time   INR 1.5 (H) 03/21/2020 2143   INR 2.10 12/07/2012 1025     Intake/Output Summary (Last 24 hours) at 03/30/2020 0813 Last data filed at 03/30/2020 0620 Gross per 24 hour  Intake  5393.14 ml  Output 3680 ml  Net 1713.14 ml     Assessment/plan:  54 y.o. [name] is s/p: Procedure Performed: 1.  Ultrasound-guided cannulation of right internal jugular vein 2.  Ultrasound-guided cannulation of left internal jugular vein 3.  Ultrasound-guided placement of 7 French double-lumen central venous line and left internal jugular vein 4.  Intravascular ultrasound of bilateral femoral, bilateral common femoral, bilateral external iliac and bilateral common iliac veins and IVC 5.  IVC venogram 6.  Angio VAC and cleaner wire mechanical thrombectomy of IVC, bilateral common iliac, bilateral external iliac, bilateral common femoral and bilateral femoral veins 7.  Stent of IVC with 18 x 90 mm Wallstent 8.  Ultrasound-guided cannulation right common femoral vein 9.  Veno venous bypass for 58 minutes.   Plan for 1L bolus this a.m.Marland Kitchen will recheck labs. Fit for compression. Will have to stay in icu currently on pressors.    Dezra Mandella C. Donzetta Matters, MD [facility] [place] Office: [phone] Pager: [phone]  03/30/2020 8:13 AM

## 2020-03-30 NOTE — Progress Notes (Signed)
ANTICOAGULATION CONSULT NOTE - Follow Up Consult  Pharmacy Consult for heparin>>argatroban Indication: clotted IVC and DVTs  Labs: Recent Labs    03/28/20 0517 03/28/20 1357 03/29/20 0615 03/29/20 0949 03/29/20 1614 03/29/20 1614 03/30/20 0241 03/30/20 0730 03/30/20 0922 03/30/20 1231  HGB   < >  --  11.7*   < > 12.4   < > 10.2*  --  8.8*  --   HCT   < >  --  36.7   < > 36.7  --  28.8*  --  25.6*  --   PLT   < >  --  143*   < > PLATELET CLUMPS NOTED ON SMEAR, UNABLE TO ESTIMATE  --  69*  --  66*  --   APTT  --   --   --   --   --   --   --  100*  --  83*  HEPARINUNFRC  --  0.38 0.22*  --   --   --  0.20*  --   --   --   CREATININE   < >  --  0.87   < > 0.63  --  0.87  --  0.70  --    < > = values in this interval not displayed.    Assessment: 54yo female subtherapeutic on heparin after resumed s/p mechanical thrombectomy; patient now has been transitioned to argatroban d/t thrombocytopenia. No gtt issues or signs of bleeding per RN.  Aptt now at goal on 0.33mcg/kg/min after rate adjustment this morning.   Goal of Therapy:  Aptt goal 50-90s   Plan:  Continue argatroban at 0.11mcg/kg/min Aptt this afternoon  Erin Hearing PharmD., BCPS Clinical Pharmacist 03/30/2020 2:33 PM

## 2020-03-30 NOTE — Progress Notes (Addendum)
Nutrition Follow up  DOCUMENTATION CODES:   Not applicable  INTERVENTION:   Recommend addition of bowel regimen.    Glucerna Shake po TID, each supplement provides 220 kcal and 10 grams of protein  Encourage adequate PO intake.   NUTRITION DIAGNOSIS:   Increased nutrient needs related to acute illness (COVID) as evidenced by estimated needs.  GOAL:   Patient will meet greater than or equal to 90% of their needs  MONITOR:   PO intake, Supplement acceptance, Skin, Weight trends, Labs, I & O's  REASON FOR ASSESSMENT:   Malnutrition Screening Tool    ASSESSMENT:   54 y.o. female with medical history significant for type 2 diabetes, hypothyroidism, hyperlipidemia, history of PE, and history of colon cancer who presents for evaluation of new onset low back pain with lower extremity weakness and change in sensation, she was recently diagnosed with COVID-19 on 03/10/2020. Pt with Sepsis due to COVID-19 pneumonitis. MRI lumbar spine/thoracic spine is done, significant for IVC filter thrombosis.   6/8- s/p placement of bilateral popliteal lytic catheters 6/9- s/p mechanical thrombectomy/IVC filter removal   Requiring levophed. Pt complaining of abdomen pain. No BM x4 days.   Pt reports appetite is slow to progress s/p surgery. States she consumed 30% of breakfast this am. Likes Glucerna but has skipped them recently due to poor appetite. Encouraged meal and supplement intake. Recommend switching to higher kcal and higher protein supplement as intake remains poor (Ensure). Pt wishes to stay with Glucerna as she likes the taste better.   I/O: +7,646 ml since  UOP: 2,760 ml x 24 hrs  EBL: 1000 ml   Drips: NS @ 100 ml/hr, levophed  Medications: 500 mg Vitamin C daily, SS novolog, lantus, tradjenta, 220 mg zinc sulfate Labs: CBG [id number]  Diet Order:   Diet Order            Diet Carb Modified Fluid consistency: Thin; Room service appropriate? Yes  Diet effective now                  EDUCATION NEEDS:   Not appropriate for education at this time  Skin:  Skin Assessment: Skin Integrity Issues: Skin Integrity Issues:: Incisions Incisions: bilateral neck/bilateral legs  Last BM:  6/6  Height:   Ht Readings from Last 1 Encounters:  03/22/20 5\' 11"  (1.803 m)    Weight:   Wt Readings from Last 1 Encounters:  03/30/20 111.4 kg    BMI:  Body mass index is 34.25 kg/m.  Estimated Nutritional Needs:   Kcal:  1900-2100  Protein:  95-110 grams  Fluid:  >/= 2 L/day  Mariana Single RD, LDN Clinical Nutrition Pager listed in [place]

## 2020-03-30 NOTE — Progress Notes (Signed)
PROGRESS NOTE                                                                                                                                                                                                             Patient Demographics:    [name], is a 54 y.o. female, DOB - 03/28/1966, NFA:[id number]  Outpatient Primary MD for the patient is Glendale Chard, MD   Admit date - 03/21/2020   LOS - 8  Chief Complaint  Patient presents with  . Hypotension  . Back Pain       Brief Narrative: Patient is a 54 y.o. female with PMHx of DM-2, HLD, hypothyroidism, PE-s/p IVC filter in 2012, history of colon cancer-diagnosed with COVID-19 on 5/21-presented with fatigue/malaise, low back pain-found to have patchy airspace disease on CTA chest, MRI thoracic/lumbar spine showed T7 disc herniation-but also showed extensive clot in the IVC.  Subsequently a Doppler showed extensive DVT in the bilateral lower extremity.  She was started on IV heparin-vascular surgery was consulted.  See below for further details.  Significant Events: 6/1>> admit to [facility] for low back pain/weakness/malaise-found to have COVID-19 pneumonia on CT imaging. 6/2>> lower extremity Doppler/MRI LS spine-significant lower extremity DVT extending up to IVC filter 6/8>> catheter guided lytic therapy for bilateral lower extremity DVT 6/9>> mechanical thrombectomy/IVC filter removal 6/10>> hypotension overnight-requiring initiation of Levophed  Significant studies: 6/1>> CTA chest: No PE-multifocal patchy airspace opacities throughout both lungs. 6/1>> CT abdomen/pelvis: Hepatic steatosis, aortic atherosclerosis 6/2>> MRI thoracic spine: T7-8 central to left-sided disc herniation 6/2>> MRI lumbar spine: No significant disc pathology/no stenosis or neurocompression.  Thrombosis of the IVC, both iliac veins to the level of the IVC filter. 6/2>> lower extremity Doppler:  Bilateral lower extremity extensive DVT.  Procedures: 6/8>>  #1: Ultrasound-guided access, bilateral popliteal vein  #2: Intravascular ultrasound, bilateral femoral, common femoral, external iliac, common iliac veins, and inferior vena cava #3: Placement of bilateral intravenous infusion catheter for thrombolytic therapy into the vena cava.                         #4: Initiation of thrombolytic therapy (TPA into the inferior vena cava, common iliac, external iliac, common femoral, and femoral veins  6/9>> 1.  Ultrasound-guided cannulation of right internal jugular vein 2.  Ultrasound-guided cannulation  of left internal jugular vein 3.  Ultrasound-guided placement of 7 French double-lumen central venous line and left internal jugular vein 4.  Intravascular ultrasound of bilateral femoral, bilateral common femoral, bilateral external iliac and bilateral common iliac veins and IVC 5.  IVC venogram 6.  Angio VAC and cleaner wire mechanical thrombectomy of IVC, bilateral common iliac, bilateral external iliac, bilateral common femoral and bilateral femoral veins 7.  Stent of IVC with 18 x 90 mm Wallstent 8.  Ultrasound-guided cannulation right common femoral vein 9.  Veno venous bypass for 58 minutes   COVID-19 medications: Steroids:6/1>>6/9 Remdesivir:6/1>>6/6  Antibiotics: Zithromax: 6/1>> 6/4 Rocephin: 6/1>> 6/4  Microbiology data: 6/1>> blood culture: Negative 6/2>> urine culture: Negative  Consults: VVS  DVT prophylaxis: IV argatoban    Subjective:   Developed hypotension last night-on pressor support with Levophed this morning.   Assessment  & Plan :   Sepsis secondary to Covid 19 Viral pneumonia: Sepsis physiology has resolved-has completed a course of steroids/remdesivir.  Remains stable on room air.  Fever: afebrile  O2 requirements:  SpO2: 97 % O2 Flow Rate (L/min): 2 L/min   COVID-19 Labs: Recent Labs    03/27/20 1136 03/28/20 0517 03/29/20 0615  CRP  1.3* 0.6 1.8*       Component Value Date/Time   BNP 29.9 03/21/2020 2148    No results for input(s): PROCALCITON in the last 168 hours.  Lab Results  Component Value Date   SARSCOV2NAA POSITIVE (A) 03/10/2020    Prone/Incentive Spirometry: encouraged incentive spirometry use 3-4/hour.  Extensive bilateral lower extremity DVTs-involving bilateral iliac veins and extending to IVC up to the level of IVC filter: s/p bilateral popliteal lytic catheter placement on 6/8-and mechanical thrombectomy/IVC filter on 6/9-reviewed following and directing care.  Hypotension: Developed hypotension overnight-on Levophed-plans are to bolus intermittently with IV fluids-attempt to titrate down Levophed.  Repeat CBC pending to ensure no significant blood loss post procedure.  Overall clinical scenario not suggestive of sepsis or cardiogenic shock.  Thrombocytopenia: Monitor closely-switch from IV heparin to argatroban-HIT antibody pending.  Anemia: Likely secondary to perioperative blood loss-follow CBC.  AKI: Likely hemodynamically mediated-resolved  DM-2 with uncontrolled hyperglycemia with mild DKA: Initially on insulin infusion-CBGs currently stable with 10 units of Lantus daily, SSI-follow and adjust.  Recent Labs    03/29/20 1523 03/29/20 2128 03/30/20 0551  GLUCAP 223* 221* 187*    T7-T8 disc herniation: Prior MD-Dr. Waldron Labs already discussed MRI findings with Dr. Jenkins-recommendations were for outpatient follow-up.  Hypothyroidism: Continue Synthroid.  TSH mildly elevated-not sure when her Synthroid dosing was adjusted last-stable for outpatient follow-up/adjustment with PCP.  HLD: Continue statin  Nutrition Problem: Nutrition Problem: Increased nutrient needs Etiology: acute illness (COVID) Signs/Symptoms: estimated needs Interventions: Glucerna shake  ABG:    Component Value Date/Time   PHART 7.345 (L) 03/29/2020 1425   PCO2ART 42.2 03/29/2020 1425   PO2ART 200 (H)  03/29/2020 1425   HCO3 23.0 03/29/2020 1425   TCO2 24 03/29/2020 1425   ACIDBASEDEF 2.0 03/29/2020 1425   O2SAT 100.0 03/29/2020 1425    Vent Settings: N/A   Condition -Stable  Family Communication  : Spouse over the phone on 6/10  Code Status :  Full Code  Diet :  Diet Order            Diet Carb Modified Fluid consistency: Thin; Room service appropriate? Yes  Diet effective now                  Disposition Plan  :  Status is: Inpatient  Remains inpatient appropriate because:Inpatient level of care appropriate due to severity of illness  Dispo: The patient is from: Home              Anticipated d/c is to: Home              Anticipated d/c date is: 2 days              Patient currently is not medically stable to d/c.  Barriers to discharge: Extensive bilateral DVT-s/p catheter guided lytic mechanical thrombectomy today.  Antimicorbials  :    Anti-infectives (From admission, onward)   Start     Dose/Rate Route Frequency Ordered Stop   03/23/20 2200  azithromycin (ZITHROMAX) tablet 500 mg  Status:  Discontinued        500 mg Oral Daily at bedtime 03/23/20 0806 03/25/20 1233   03/23/20 1000  remdesivir 100 mg in sodium chloride 0.9 % 100 mL IVPB       "Followed by" Linked Group Details   100 mg 200 mL/hr over 30 Minutes Intravenous Daily 03/22/20 0110 03/26/20 1913   03/22/20 2200  azithromycin (ZITHROMAX) 500 mg in sodium chloride 0.9 % 250 mL IVPB  Status:  Discontinued        500 mg 250 mL/hr over 60 Minutes Intravenous Every 24 hours 03/22/20 0111 03/23/20 0806   03/22/20 2200  cefTRIAXone (ROCEPHIN) 1 g in sodium chloride 0.9 % 100 mL IVPB  Status:  Discontinued        1 g 200 mL/hr over 30 Minutes Intravenous Every 24 hours 03/22/20 0111 03/25/20 1233   03/22/20 0200  remdesivir 200 mg in sodium chloride 0.9% 250 mL IVPB       "Followed by" Linked Group Details   200 mg 580 mL/hr over 30 Minutes Intravenous Once 03/22/20 0110 03/22/20 1900   03/22/20 0015   cefTRIAXone (ROCEPHIN) 1 g in sodium chloride 0.9 % 100 mL IVPB        1 g 200 mL/hr over 30 Minutes Intravenous  Once 03/22/20 0010 03/22/20 0103   03/21/20 2300  cefTRIAXone (ROCEPHIN) injection 1 g  Status:  Discontinued        1 g Intramuscular  Once 03/21/20 2253 03/22/20 0010   03/21/20 2300  azithromycin (ZITHROMAX) 500 mg in sodium chloride 0.9 % 250 mL IVPB        500 mg 250 mL/hr over 60 Minutes Intravenous  Once 03/21/20 2253 03/22/20 0129      Inpatient Medications  Scheduled Meds: . vitamin C  500 mg Oral Daily  . Chlorhexidine Gluconate Cloth  6 each Topical Daily  . famotidine  20 mg Oral BID  . feeding supplement (GLUCERNA SHAKE)  237 mL Oral TID BM  . insulin aspart  0-15 Units Subcutaneous TID WC  . insulin aspart  0-5 Units Subcutaneous QHS  . insulin glargine  10 Units Subcutaneous Daily  . Ipratropium-Albuterol  1 puff Inhalation Q6H  . levothyroxine  100 mcg Oral Q0600  . linagliptin  5 mg Oral Daily  . simvastatin  10 mg Oral Daily  . sodium chloride flush  10-40 mL Intracatheter Q12H  . sodium chloride flush  3 mL Intravenous Q12H  . zinc sulfate  220 mg Oral Daily   Continuous Infusions: . sodium chloride 100 mL/hr at 03/30/20 0628  . sodium chloride 10 mL/hr at 03/30/20 0443  . argatroban    . norepinephrine (LEVOPHED) Adult infusion 5 mcg/min (03/30/20 0443)  .  sodium chloride 250 mL (03/30/20 0914)   PRN Meds:.sodium chloride, acetaminophen, chlorpheniramine-HYDROcodone, guaiFENesin-dextromethorphan, HYDROmorphone (DILAUDID) injection, loperamide, morphine injection, ondansetron **OR** ondansetron (ZOFRAN) IV, oxyCODONE, senna-docusate, sodium chloride flush, zolpidem   Time Spent in minutes  25  See all Orders from today for further details   Oren Binet M.D on 03/30/2020 at 10:45 AM  To page go to [email or web address] - use universal password  [facility] -  Office  [phone]    Objective:   Vitals:   03/30/20 0630 03/30/20 0632  03/30/20 0645 03/30/20 0700  BP:      Pulse: 80  76 80  Resp: 16  16 18   Temp:      TempSrc:      SpO2: 98%  97% 97%  Weight:  111.4 kg    Height:        Wt Readings from Last 3 Encounters:  03/30/20 111.4 kg  02/15/20 91.2 kg  08/02/19 95.3 kg     Intake/Output Summary (Last 24 hours) at 03/30/2020 1045 Last data filed at 03/30/2020 0867 Gross per 24 hour  Intake 5406.14 ml  Output 3880 ml  Net 1526.14 ml     Physical Exam Gen Exam:Alert awake-not in any distress HEENT:atraumatic, normocephalic Chest: B/L clear to auscultation anteriorly CVS:S1S2 regular Abdomen:soft non tender, non distended Extremities: Compression bandages in place bilaterally-difficult to assess if she still has edema. Neurology: Non focal Skin: no rash   Data Review:    CBC Recent Labs  Lab 03/24/20 0834 03/24/20 0834 03/25/20 0506 03/25/20 0506 03/26/20 0537 03/26/20 0537 03/27/20 1136 03/27/20 1136 03/28/20 0517 03/28/20 0517 03/29/20 0615 03/29/20 0949 03/29/20 1138 03/29/20 1237 03/29/20 1425 03/29/20 1614 03/30/20 0241  WBC 9.5   < > 7.8   < > 7.7   < > 8.2  --  7.8  --  6.6  --   --   --   --  14.6* 12.6*  HGB 11.8*   < > 11.3*   < > 11.2*   < > 11.5*   < > 11.2*   < > 11.7*   < > 8.5* 9.5* 6.8* 12.4 10.2*  HCT 36.4   < > 35.5*   < > 36.0   < > 36.1   < > 35.6*   < > 36.7   < > 25.0* 28.0* 20.0* 36.7 28.8*  PLT 164   < > 161   < > 183   < > 167  --  177  --  143*  --   --   --   --  PLATELET CLUMPS NOTED ON SMEAR, UNABLE TO ESTIMATE 69*  MCV 91.9   < > 92.2   < > 93.0   < > 93.0  --  93.4  --  94.1  --   --   --   --  86.6 83.5  MCH 29.8   < > 29.4   < > 28.9   < > 29.6  --  29.4  --  30.0  --   --   --   --  29.2 29.6  MCHC 32.4   < > 31.8   < > 31.1   < > 31.9  --  31.5  --  31.9  --   --   --   --  33.8 35.4  RDW 13.9   < > 13.9   < > 14.0   < > 14.4  --  14.4  --  15.0  --   --   --   --  15.3 16.5*  LYMPHSABS 0.6*  --  0.8  --  1.4  --   --   --   --   --   --   --    --   --   --  1.4  --   MONOABS 0.1  --  0.2  --  0.5  --   --   --   --   --   --   --   --   --   --  1.0  --   EOSABS 0.0  --  0.0  --  0.0  --   --   --   --   --   --   --   --   --   --  0.0  --   BASOSABS 0.0  --  0.0  --  0.0  --   --   --   --   --   --   --   --   --   --  0.0  --    < > = values in this interval not displayed.    Chemistries  Recent Labs  Lab 03/23/20 1224 03/24/20 0834 03/26/20 0537 03/26/20 0537 03/27/20 1136 03/27/20 1136 03/28/20 0517 03/28/20 0517 03/29/20 0615 03/29/20 0949 03/29/20 1237 03/29/20 1425 03/29/20 1614 03/30/20 0241 03/30/20 0922  NA 137   < > 138   < > 135   < > 137   < > 137   < > 137 138 137 135 136  K 4.1   < > 4.3   < > 4.1   < > 4.4   < > 5.2*   < > 3.8 3.8 4.1 4.7 4.0  CL 103   < > 101   < > 102   < > 103  --  105  --   --   --  108 106 107  CO2 20*   < > 27   < > 26   < > 25  --  26  --   --   --  19* 21* 22  GLUCOSE 224*   < > 229*   < > 214*   < > 164*  --  182*  --   --   --  298* 211* 173*  BUN 22*   < > 26*   < > 26*   < > 27*  --  22*  --   --   --  18 21* 15  CREATININE 0.85   < > 0.95   < > 0.92   < > 0.89  --  0.87  --   --   --  0.63 0.87 0.70  CALCIUM 8.3*   < > 9.3   < > 9.1   < > 9.1  --  8.8*  --   --   --  7.7* 7.7* 7.6*  MG 2.0  --   --   --   --   --   --   --   --   --   --   --   --   --   --   AST 23   < > 21  --  20  --  18  --  31  --   --   --   --  16  --   ALT 19   < > 24  --  30  --  28  --  35  --   --   --   --  22  --   ALKPHOS 74   < > 68  --  73  --  68  --  82  --   --   --   --  40  --   BILITOT 0.9   < > 0.6  --  0.7  --  0.7  --  1.6*  --   --   --   --  1.1  --    < > = values in this interval not displayed.   ------------------------------------------------------------------------------------------------------------------ No results for input(s): CHOL, HDL, LDLCALC, TRIG, CHOLHDL, LDLDIRECT in the last 72 hours.  Lab Results  Component Value Date   HGBA1C 8.6 (H) 03/23/2020    ------------------------------------------------------------------------------------------------------------------ No results for input(s): TSH, T4TOTAL, T3FREE, THYROIDAB in the last 72 hours.  Invalid input(s): FREET3 ------------------------------------------------------------------------------------------------------------------ No results for input(s): VITAMINB12, FOLATE, FERRITIN, TIBC, IRON, RETICCTPCT in the last 72 hours.  Coagulation profile No results for input(s): INR, PROTIME in the last 168 hours.  No results for input(s): DDIMER in the last 72 hours.  Cardiac Enzymes No results for input(s): CKMB, TROPONINI, MYOGLOBIN in the last 168 hours.  Invalid input(s): CK ------------------------------------------------------------------------------------------------------------------    Component Value Date/Time   BNP 29.9 03/21/2020 2148    Micro Results Recent Results (from the past 240 hour(s))  Blood Culture (routine x 2)     Status: None   Collection Time: 03/21/20 10:00 PM   Specimen: BLOOD  Result Value Ref Range Status   Specimen Description BLOOD LEFT ANTECUBITAL  Final   Special Requests   Final    BOTTLES DRAWN AEROBIC AND ANAEROBIC Blood Culture results may not be optimal due to an inadequate volume of blood received in culture bottles   Culture   Final    NO GROWTH 5 DAYS Performed at [facility], [place] [address] [place], [place] [address]    Report Status 03/26/2020 FINAL  Final  Blood Culture (routine x 2)     Status: None   Collection Time: 03/21/20 11:41 PM   Specimen: BLOOD LEFT HAND  Result Value Ref Range Status   Specimen Description BLOOD LEFT HAND  Final   Special Requests   Final    BOTTLES DRAWN AEROBIC AND ANAEROBIC Blood Culture adequate volume   Culture   Final    NO GROWTH 5 DAYS Performed at [facility], [place] [address] [place], [place] [address]    Report Status 03/27/2020 FINAL  Final  Urine culture     Status:  None   Collection Time: 03/22/20  1:48 AM   Specimen: In/Out Cath Urine  Result Value Ref Range Status   Specimen Description IN/OUT CATH URINE  Final   Special Requests NONE  Final   Culture   Final    NO GROWTH Performed at [facility], [place] [address], [place], [place] [address]    Report Status 03/22/2020 FINAL  Final  MRSA PCR Screening     Status: None   Collection Time: 03/22/20 12:32 PM   Specimen: Nasal Mucosa; Nasopharyngeal  Result Value Ref Range Status   MRSA by PCR NEGATIVE NEGATIVE Final    Comment:        The GeneXpert MRSA Assay (FDA approved for NASAL specimens only), is one component of a comprehensive MRSA colonization surveillance program. It is not intended to diagnose MRSA infection nor to guide or monitor treatment for MRSA  infections. Performed at [facility], [place] [address] [place], [place] [address]     Radiology Reports CT Angio Chest PE W and/or Wo Contrast  Result Date: 03/22/2020 CLINICAL DATA:  Severe back pain hypotension and shallow EXAM: CT ANGIOGRAPHY CHEST WITH CONTRAST TECHNIQUE: Multidetector CT imaging of the chest was performed using the standard protocol during bolus administration of intravenous contrast. Multiplanar CT image reconstructions and MIPs were obtained to evaluate the vascular anatomy. CONTRAST:  68mL OMNIPAQUE IOHEXOL 350 MG/ML SOLN COMPARISON:  None. FINDINGS: Cardiovascular: There is a optimal opacification of the pulmonary arteries. There is no central,segmental, or subsegmental filling defects within the pulmonary arteries. The heart is normal in size. No pericardial effusion or thickening. No evidence right heart strain. There is normal three-vessel brachiocephalic anatomy without proximal stenosis. The thoracic aorta is normal in appearance. Mediastinum/Nodes: No hilar, mediastinal, or axillary adenopathy. Thyroid gland, trachea, and esophagus demonstrate no significant findings. Lungs/Pleura: Multifocal  patchy ground-glass opacities are seen predominantly within the periphery of both lungs. No pleural effusion or pneumothorax is seen. Upper Abdomen: No acute abnormalities present in the visualized portions of the upper abdomen. Musculoskeletal: No chest wall abnormality. No acute or significant osseous findings. Review of the MIP images confirms the above findings. Abdomen/pelvis: Hepatobiliary: There is diffuse low density seen throughout the liver parenchyma. The main portal vein is patent. No evidence of calcified gallstones, gallbladder wall thickening or biliary dilatation. Pancreas: Unremarkable. No pancreatic ductal dilatation or surrounding inflammatory changes. Spleen: Normal in size without focal abnormality. Adrenals/Urinary Tract: Both adrenal glands appear normal. The kidneys and collecting system appear normal without evidence of urinary tract calculus or hydronephrosis. Bladder is unremarkable. Stomach/Bowel: The stomach, small bowel, and colon are normal in appearance. No inflammatory changes, wall thickening, or obstructive findings. There appears to be a surgical anastomosis at the sigmoid rectal junction. Mild presacral soft tissue density is again identified. Vascular/Lymphatic: There are no enlarged mesenteric, retroperitoneal, or pelvic lymph nodes. An infrarenal IVC filter is seen at approximately the L3 level. There is scattered aortic and bi-iliac calcifications noted. Reproductive: The uterus and adnexa are unremarkable. Other: No evidence of abdominal wall mass or hernia. Musculoskeletal: No acute or significant osseous findings. IMPRESSION: No central, segmental, or subsegmental pulmonary embolism. Multifocal patchy airspace opacities throughout both lungs, likely consistent with multifocal pneumonia. Hepatic steatosis Aortic Atherosclerosis (ICD10-I70.0). Electronically Signed   By: Prudencio Pair M.D.   On: 03/22/2020 00:07   MR THORACIC SPINE W WO CONTRAST  Result Date:  03/22/2020 CLINICAL DATA:  Bilateral lower extremity pain and numbness with urinary retention. Diabetes. History of colon cancer. EXAM: MRI THORACIC WITHOUT AND WITH CONTRAST TECHNIQUE: Multiplanar and multiecho pulse sequences of the thoracic spine were obtained without and with intravenous contrast. CONTRAST:  9.58mL GADAVIST GADOBUTROL 1 MMOL/ML IV SOLN COMPARISON:  CT chest done yesterday. FINDINGS: MRI THORACIC SPINE FINDINGS Alignment:  Normal Vertebrae: Normal. No fracture or primary bone lesion. No metastatic disease. Insignificant small hemangioma within the T11 vertebral body. Cord: No primary cord pathology. See below regarding stenosis at T7-8. Paraspinal and other soft tissues: Negative Disc levels: No abnormality at T5-6 or above. T6-7: Small right paracentral disc protrusion indents the thecal sac slightly but does not cause apparent neural compression. T7-8: Left posterolateral disc herniation effaces the ventral subarachnoid space and indents the left side of the cord. This could be symptomatic. T8-9 through T12-L1: Normal. IMPRESSION: At T7-8, there is a central to left-sided disc herniation that effaces the ventral subarachnoid  space and deforms the left side of cord slightly. Ample subarachnoid space is present dorsal to the cord at this level. None the less, this could possibly be symptomatic. Small right paracentral disc herniation at T6-7 without apparent neural compression. Electronically Signed   By: Nelson Chimes M.D.   On: 03/22/2020 12:32   MR Lumbar Spine W Wo Contrast  Result Date: 03/22/2020 CLINICAL DATA:  Back pain.  Urinary retention.  Diabetes. EXAM: MRI LUMBAR SPINE WITHOUT AND WITH CONTRAST TECHNIQUE: Multiplanar and multiecho pulse sequences of the lumbar spine were obtained without and with intravenous contrast. CONTRAST:  9.7mL GADAVIST GADOBUTROL 1 MMOL/ML IV SOLN COMPARISON:  None. FINDINGS: Segmentation:  5 lumbar type vertebral bodies. Alignment:  Slightly exaggerated  lower lumbar lordosis. Vertebrae:  No fracture or primary bone lesion. Conus medullaris and cauda equina: Conus extends to the L1 level. Conus and cauda equina appear normal. Paraspinal and other soft tissues: There appears be thrombosis of the IVC and both iliac veins up to the level of the IVC filter. Flow is restored in the IVC by inflow from the renal veins. Disc levels: No abnormality from L1-2 through L2-3. L3-4: No disc abnormality.  Mild facet hypertrophy.  No stenosis. L4-5: No disc abnormality. Mild to moderate facet hypertrophy. No stenosis. L5-S1: Minimal disc bulge.  Mild facet hypertrophy.  No stenosis. IMPRESSION: No significant disc pathology. No stenosis or neural compression in the lumbar region. Patient has exaggerated lumbar lordosis. 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 Flow is restored in the IVC above that by inflow from the renal veins. I am in the process of calling this report. Electronically Signed   By: Nelson Chimes M.D.   On: 03/22/2020 12:46   CT ABDOMEN PELVIS W CONTRAST  Result Date: 03/22/2020 CLINICAL DATA:  Severe back pain hypotension and shallow EXAM: CT ANGIOGRAPHY CHEST WITH CONTRAST TECHNIQUE: Multidetector CT imaging of the chest was performed using the standard protocol during bolus administration of intravenous contrast. Multiplanar CT image reconstructions and MIPs were obtained to evaluate the vascular anatomy. CONTRAST:  20mL OMNIPAQUE IOHEXOL 350 MG/ML SOLN COMPARISON:  None. FINDINGS: Cardiovascular: There is a optimal opacification of the pulmonary arteries. There is no central,segmental, or subsegmental filling defects within the pulmonary arteries. The heart is normal in size. No pericardial effusion or thickening. No evidence right heart strain. There is normal  three-vessel brachiocephalic anatomy without proximal stenosis. The thoracic aorta is normal in appearance. Mediastinum/Nodes: No hilar, mediastinal, or axillary adenopathy. Thyroid gland, trachea, and esophagus demonstrate no significant findings. Lungs/Pleura: Multifocal patchy ground-glass opacities are seen predominantly within the periphery of both lungs. No pleural effusion or pneumothorax is seen. Upper Abdomen: No acute abnormalities present in the visualized portions of the upper abdomen. Musculoskeletal: No chest wall abnormality. No acute or significant osseous findings. Review of the MIP images confirms the above findings. Abdomen/pelvis: Hepatobiliary: There is diffuse low density seen throughout the liver parenchyma. The main portal vein is patent. No evidence of calcified gallstones, gallbladder wall thickening or biliary dilatation. Pancreas: Unremarkable. No pancreatic ductal dilatation or surrounding inflammatory changes. Spleen: Normal in size without focal abnormality. Adrenals/Urinary Tract: Both adrenal glands appear normal. The kidneys and collecting system appear normal without evidence of urinary tract calculus or hydronephrosis. Bladder is unremarkable. Stomach/Bowel: The stomach, small  bowel, and colon are normal in appearance. No inflammatory changes, wall thickening, or obstructive findings. There appears to be a surgical anastomosis at the sigmoid rectal junction. Mild presacral soft tissue density is again identified. Vascular/Lymphatic: There are no enlarged mesenteric, retroperitoneal, or pelvic lymph nodes. An infrarenal IVC filter is seen at approximately the L3 level. There is scattered aortic and bi-iliac calcifications noted. Reproductive: The uterus and adnexa are unremarkable. Other: No evidence of abdominal wall mass or hernia. Musculoskeletal: No acute or significant osseous findings. IMPRESSION: No central, segmental, or subsegmental pulmonary embolism. Multifocal patchy  airspace opacities throughout both lungs, likely consistent with multifocal pneumonia. Hepatic steatosis Aortic Atherosclerosis (ICD10-I70.0). Electronically Signed   By: Prudencio Pair M.D.   On: 03/22/2020 00:07   DG Chest Port 1 View  Result Date: 03/21/2020 CLINICAL DATA:  54 year old female with weakness and hypotension. EXAM: PORTABLE CHEST 1 VIEW COMPARISON:  Chest radiograph dated 03/25/2016 FINDINGS: Faint bilateral peripheral and subpleural densities, right greater left concerning for pneumonia, possibly atypical or viral in etiology including COVID-19. Clinical correlation is recommended. No focal consolidation, pleural effusion, pneumothorax. The cardiac silhouette is within limits. No acute osseous pathology. IMPRESSION: Findings concerning for pneumonia. Electronically Signed   By: Anner Crete M.D.   On: 03/21/2020 22:17   VAS [place] LOWER EXTREMITY VENOUS (DVT)  Result Date: 03/22/2020  Lower Venous DVTStudy Indications: Hx DVT and IVC filter, now with COVID and elevated D dimer.  Comparison Study: 06/07/15 negative Performing Technologist: June Leap RDMS, RVT  Examination Guidelines: A complete evaluation includes B-mode imaging, spectral Doppler, color Doppler, and power Doppler as needed of all accessible portions of each vessel. Bilateral testing is considered an integral part of a complete examination. Limited examinations for reoccurring indications may be performed as noted. The reflux portion of the exam is performed with the patient in reverse Trendelenburg.  +---------+---------------+---------+-----------+----------+-----------------+ RIGHT    CompressibilityPhasicitySpontaneityPropertiesThrombus Aging    +---------+---------------+---------+-----------+----------+-----------------+ CFV      None           No       No                   Acute             +---------+---------------+---------+-----------+----------+-----------------+ SFJ      None                                          Acute             +---------+---------------+---------+-----------+----------+-----------------+ FV Prox  None                                         Acute             +---------+---------------+---------+-----------+----------+-----------------+ FV Mid   None                                         Acute             +---------+---------------+---------+-----------+----------+-----------------+ FV DistalNone  Acute             +---------+---------------+---------+-----------+----------+-----------------+ PFV      None                                         Acute             +---------+---------------+---------+-----------+----------+-----------------+ POP      None           No       No                   Acute             +---------+---------------+---------+-----------+----------+-----------------+ PTV      None                                         Acute             +---------+---------------+---------+-----------+----------+-----------------+ PERO     None                                         Acute             +---------+---------------+---------+-----------+----------+-----------------+ Gastroc  None                                         Acute             +---------+---------------+---------+-----------+----------+-----------------+ GSV      None                                         Acute             +---------+---------------+---------+-----------+----------+-----------------+ Ex Iliac                No       No                   Age Indeterminate +---------+---------------+---------+-----------+----------+-----------------+ Com Iliac                                             Not visualized    +---------+---------------+---------+-----------+----------+-----------------+   +---------+---------------+---------+-----------+----------+-----------------+ LEFT      CompressibilityPhasicitySpontaneityPropertiesThrombus Aging    +---------+---------------+---------+-----------+----------+-----------------+ CFV      None           No       No                   Acute             +---------+---------------+---------+-----------+----------+-----------------+ SFJ      None                                         Acute             +---------+---------------+---------+-----------+----------+-----------------+  FV Prox  None                                         Acute             +---------+---------------+---------+-----------+----------+-----------------+ FV Mid   None                                         Acute             +---------+---------------+---------+-----------+----------+-----------------+ FV DistalNone                                         Acute             +---------+---------------+---------+-----------+----------+-----------------+ PFV      None                                         Acute             +---------+---------------+---------+-----------+----------+-----------------+ POP      None           No       No                   Acute             +---------+---------------+---------+-----------+----------+-----------------+ PTV      None                                         Acute             +---------+---------------+---------+-----------+----------+-----------------+ PERO     None                                         Acute             +---------+---------------+---------+-----------+----------+-----------------+ Gastroc  None                                         Acute             +---------+---------------+---------+-----------+----------+-----------------+ GSV      None                                         Acute             +---------+---------------+---------+-----------+----------+-----------------+ Ex Iliac                No       No                   Age  Indeterminate +---------+---------------+---------+-----------+----------+-----------------+ Com Iliac  Not visualized    +---------+---------------+---------+-----------+----------+-----------------+     Summary: RIGHT: - Findings consistent with acute deep vein thrombosis involving the right common femoral vein, SF junction, right femoral vein, right proximal profunda vein, right popliteal vein, right posterior tibial veins, right peroneal veins, and right gastrocnemius veins. - Findings consistent with acute superficial vein thrombosis involving the right great saphenous vein. - Thrombosis extends into the Right Iliac vein and IVC up to the level of the filter placement.  LEFT: - Findings consistent with acute deep vein thrombosis involving the left common femoral vein, SF junction, left femoral vein, left proximal profunda vein, left popliteal vein, left posterior tibial veins, left peroneal veins, and left gastrocnemius veins. - Findings consistent with acute superficial vein thrombosis involving the left great saphenous vein. - Thrombosis extends into the Left Iliac vein.  *See table(s) above for measurements and observations. Electronically signed by Curt Jews MD on 03/22/2020 at 5:19:17 PM.    Final    HYBRID OR IMAGING ([place])  Result Date: 03/29/2020 There is no interpretation for this exam.  This order is for images obtained during a surgical procedure.  Please See "Surgeries" Tab for more information regarding the procedure.   HYBRID OR IMAGING ([facility] ONLY)  Result Date: 03/28/2020 There is no interpretation for this exam.  This order is for images obtained during a surgical procedure.  Please See "Surgeries" Tab for more information regarding the procedure.

## 2020-03-30 NOTE — Progress Notes (Signed)
ANTICOAGULATION CONSULT NOTE - Follow Up Consult  Pharmacy Consult for heparin>>argatroban Indication: clotted IVC and DVTs  Labs: Recent Labs    03/28/20 0517 03/28/20 1357 03/29/20 0615 03/29/20 0949 03/29/20 1425 03/29/20 1425 03/29/20 1614 03/30/20 0241  HGB   < >  --  11.7*   < > 6.8*   < > 12.4 10.2*  HCT   < >  --  36.7   < > 20.0*  --  36.7 28.8*  PLT   < >  --  143*  --   --   --  PLATELET CLUMPS NOTED ON SMEAR, UNABLE TO ESTIMATE 69*  HEPARINUNFRC  --  0.38 0.22*  --   --   --   --  0.20*  CREATININE   < >  --  0.87  --   --   --  0.63 0.87   < > = values in this interval not displayed.    Assessment: 54yo female subtherapeutic on heparin after resumed s/p mechanical thrombectomy; patient now has been transitioned to argatroban d/t thrombocytopenia. No gtt issues or signs of bleeding per RN.  Did notice that the dosing weight has significantly changed. Previous weight of 94kg was from 6/2, current weight up to 111kg. Will take that into account with rate change.   Goal of Therapy:  Aptt goal 50-90s   Plan:  Decrease argatroban to 0.19mcg/kg/min Aptt this afternoon  Erin Hearing PharmD., BCPS Clinical Pharmacist 03/30/2020 8:19 AM

## 2020-03-30 NOTE — Progress Notes (Addendum)
ANTICOAGULATION CONSULT NOTE - Follow Up Consult  Pharmacy Consult for heparin Indication: clotted IVC and DVTs  Labs: Recent Labs    03/28/20 0517 03/28/20 0517 03/28/20 1357 03/29/20 0615 03/29/20 0949 03/29/20 1425 03/29/20 1425 03/29/20 1614 03/30/20 0241  HGB 11.2*   < >  --  11.7*   < > 6.8*   < > 12.4 10.2*  HCT 35.6*   < >  --  36.7   < > 20.0*  --  36.7 28.8*  PLT 177   < >  --  143*  --   --   --  PLATELET CLUMPS NOTED ON SMEAR, UNABLE TO ESTIMATE PENDING  HEPARINUNFRC 0.76*   < > 0.38 0.22*  --   --   --   --  0.20*  CREATININE 0.89  --   --  0.87  --   --   --  0.63  --    < > = values in this interval not displayed.    Assessment: 54yo female subtherapeutic on heparin after resumed s/p mechanical thrombectomy; no gtt issues or signs of bleeding per RN.  Goal of Therapy:  Heparin level 0.3-0.7 units/ml   Plan:  Will increase heparin gtt by 1 unit/kg/hr to 900 units/hr and check level in 6 hours.    Wynona Neat, PharmD, BCPS  03/30/2020,3:42 AM   Addendum: Plt resulted as 69 (from 140s, admitted at 270) after previous assessment, 4T score ~4-5 = intermediate risk of HIT.  D/w MDenny of [facility]; will order HIT Ab and transition from [facility] to [place].  D/C heparin now and start argatroban at 0.75 mcg/kg/min (goal PTT 50-90).  VB 4:27 AM

## 2020-03-30 NOTE — Progress Notes (Signed)
Paged Lang Snow regarding SBP less than 80. Suggested starting vasopressor. Will start levophed.

## 2020-03-31 ENCOUNTER — Encounter (HOSPITAL_COMMUNITY): Payer: Self-pay | Admitting: Vascular Surgery

## 2020-03-31 LAB — CBC
HCT: 25.5 % — ABNORMAL LOW (ref 36.0–46.0)
Hemoglobin: 8.7 g/dL — ABNORMAL LOW (ref 12.0–15.0)
MCH: 29.9 pg (ref 26.0–34.0)
MCHC: 34.1 g/dL (ref 30.0–36.0)
MCV: 87.6 fL (ref 80.0–100.0)
Platelets: 68 10*3/uL — ABNORMAL LOW (ref 150–400)
RBC: 2.91 MIL/uL — ABNORMAL LOW (ref 3.87–5.11)
RDW: 17.2 % — ABNORMAL HIGH (ref 11.5–15.5)
WBC: 8.9 10*3/uL (ref 4.0–10.5)
nRBC: 0 % (ref 0.0–0.2)

## 2020-03-31 LAB — COMPREHENSIVE METABOLIC PANEL
ALT: 22 U/L (ref 0–44)
AST: 15 U/L (ref 15–41)
Albumin: 2.4 g/dL — ABNORMAL LOW (ref 3.5–5.0)
Alkaline Phosphatase: 47 U/L (ref 38–126)
Anion gap: 5 (ref 5–15)
BUN: 9 mg/dL (ref 6–20)
CO2: 23 mmol/L (ref 22–32)
Calcium: 7.9 mg/dL — ABNORMAL LOW (ref 8.9–10.3)
Chloride: 108 mmol/L (ref 98–111)
Creatinine, Ser: 0.6 mg/dL (ref 0.44–1.00)
GFR calc Af Amer: >60 mL/min (ref >60)
GFR calc non Af Amer: >60 mL/min (ref >60)
Glucose, Bld: 135 mg/dL — ABNORMAL HIGH (ref 70–99)
Potassium: 3.9 mmol/L (ref 3.5–5.1)
Sodium: 136 mmol/L (ref 135–145)
Total Bilirubin: 1.1 mg/dL (ref 0.3–1.2)
Total Protein: 4.6 g/dL — ABNORMAL LOW (ref 6.5–8.1)

## 2020-03-31 LAB — GLUCOSE, CAPILLARY
Glucose-Capillary: 104 mg/dL — ABNORMAL HIGH (ref 70–99)
Glucose-Capillary: 124 mg/dL — ABNORMAL HIGH (ref 70–99)
Glucose-Capillary: 136 mg/dL — ABNORMAL HIGH (ref 70–99)
Glucose-Capillary: 119 mg/dL — ABNORMAL HIGH (ref 70–99)
Glucose-Capillary: 122 mg/dL — ABNORMAL HIGH (ref 70–99)

## 2020-03-31 LAB — HEPARIN INDUCED PLATELET AB (HIT ANTIBODY): Heparin Induced Plt Ab: 0.079 OD (ref 0.000–0.400)

## 2020-03-31 LAB — APTT: aPTT: 79 seconds — ABNORMAL HIGH (ref 24–36)

## 2020-03-31 MED ORDER — POLYETHYLENE GLYCOL 3350 17 G PO PACK
17.0000 g | PACK | Freq: Two times a day (BID) | ORAL | Status: AC
  Administered 2020-03-31 – 2020-04-01 (×4): 17 g via ORAL
  Filled 2020-03-31 (×4): qty 1

## 2020-03-31 MED ORDER — MORPHINE SULFATE (PF) 2 MG/ML IV SOLN: 2.0000 mg | INTRAVENOUS | Status: DC | PRN

## 2020-03-31 MED ORDER — TRAMADOL HCL 50 MG PO TABS
50.0000 mg | ORAL_TABLET | Freq: Four times a day (QID) | ORAL | Status: DC | PRN
  Administered 2020-03-31 – 2020-04-06 (×5): 50 mg via ORAL
  Filled 2020-03-31 (×6): qty 1

## 2020-03-31 MED ORDER — SENNOSIDES-DOCUSATE SODIUM 8.6-50 MG PO TABS
2.0000 | ORAL_TABLET | Freq: Every day | ORAL | Status: DC
  Administered 2020-03-31 – 2020-04-05 (×4): 2 via ORAL
  Filled 2020-03-31 (×6): qty 2

## 2020-03-31 NOTE — Progress Notes (Signed)
  Progress Note    03/31/2020 7:36 AM 2 Days Post-Op  Subjective:  Still having back/abdominal pain  Vitals:   03/31/20 0630 03/31/20 0700  BP:  112/74  Pulse:  87  Resp:  (!) 21  Temp: 98.2 F (36.8 C)   SpO2:  94%    Physical Exam: Awake alert oriented Neck incisions clean dry intact Abdomen is moderately distended with moderate tenderness to palpation but is soft Legs are much softer thighs still with edema  CBC    Component Value Date/Time   WBC 8.9 03/31/2020 0437   RBC 2.91 (L) 03/31/2020 0437   HGB 8.7 (L) 03/31/2020 0437   HGB 13.3 12/17/2013 1538   HCT 25.5 (L) 03/31/2020 0437   HCT 40.6 12/17/2013 1538   PLT 68 (L) 03/31/2020 0437   PLT 219 12/17/2013 1538   MCV 87.6 03/31/2020 0437   MCV 90.3 12/17/2013 1538   MCH 29.9 03/31/2020 0437   MCHC 34.1 03/31/2020 0437   RDW 17.2 (H) 03/31/2020 0437   RDW 13.5 12/17/2013 1538   LYMPHSABS 1.1 03/30/2020 0922   LYMPHSABS 1.4 12/17/2013 1538   MONOABS 0.6 03/30/2020 0922   MONOABS 0.3 12/17/2013 1538   EOSABS 0.0 03/30/2020 0922   EOSABS 0.0 12/17/2013 1538   BASOSABS 0.0 03/30/2020 0922   BASOSABS 0.0 12/17/2013 1538    BMET    Component Value Date/Time   NA 136 03/31/2020 0437   NA 144 01/21/2018 1000   NA 142 12/17/2013 1538   K 3.9 03/31/2020 0437   K 4.1 12/17/2013 1538   CL 108 03/31/2020 0437   CL 106 12/07/2012 0838   CO2 23 03/31/2020 0437   CO2 28 12/17/2013 1538   GLUCOSE 135 (H) 03/31/2020 0437   GLUCOSE 123 12/17/2013 1538   GLUCOSE 119 (H) 12/07/2012 0838   BUN 9 03/31/2020 0437   BUN 12 01/21/2018 1000   BUN 14.1 12/17/2013 1538   CREATININE 0.60 03/31/2020 0437   CREATININE 0.96 10/27/2015 0904   CREATININE 0.8 12/17/2013 1538   CALCIUM 7.9 (L) 03/31/2020 0437   CALCIUM 9.7 12/17/2013 1538   GFRNONAA >60 03/31/2020 0437   GFRAA >60 03/31/2020 0437    INR    Component Value Date/Time   INR 1.5 (H) 03/21/2020 2143   INR 2.10 12/07/2012 1025     Intake/Output Summary  (Last 24 hours) at 03/31/2020 0736 Last data filed at 03/31/2020 0700 Gross per 24 hour  Intake 3659.91 ml  Output 1500 ml  Net 2159.91 ml     Assessment/plan:  54 y.o. female is s/p Angiovac can thrombectomy and filter stenting of the IVC.  Plan will be to get compression socks today.  Follow-up HIT antibody panel.  H&H are stable can transfer out of the unit.  Needs to be out of bed.  Diet as tolerated.   Dael Howland C. Donzetta Matters, MD [facility] Office: [phone] Pager: [phone]  03/31/2020 7:36 AM

## 2020-03-31 NOTE — Progress Notes (Signed)
PROGRESS NOTE                                                                                                                                                                                                             Patient Demographics:    [name], is a 54 y.o. female, DOB - Aug 20, 1966, TMH:[id number]  Outpatient Primary MD for the patient is Glendale Chard, MD   Admit date - 03/21/2020   LOS - 9  Chief Complaint  Patient presents with  . Hypotension  . Back Pain       Brief Narrative: Patient is a 54 y.o. female with PMHx of DM-2, HLD, hypothyroidism, PE-s/p IVC filter in 2012, history of colon cancer-diagnosed with COVID-19 on 5/21-presented with fatigue/malaise, low back pain-found to have patchy airspace disease on CTA chest, MRI thoracic/lumbar spine showed T7 disc herniation-but also showed extensive clot in the IVC.  Subsequently a Doppler showed extensive DVT in the bilateral lower extremity.  She was started on IV heparin-vascular surgery was consulted.  See below for further details.  Significant Events: 6/1>> admit to [facility] for low back pain/weakness/malaise-found to have COVID-19 pneumonia on CT imaging. 6/2>> lower extremity Doppler/MRI LS spine-significant lower extremity DVT extending up to IVC filter 6/8>> catheter guided lytic therapy for bilateral lower extremity DVT 6/9>> mechanical thrombectomy/IVC filter removal 6/10>> hypotension overnight-requiring initiation of Levophed 6/11>> off Levophed since 6/10 evening-transfer out of ICU  Significant studies: 6/1>> CTA chest: No PE-multifocal patchy airspace opacities throughout both lungs. 6/1>> CT abdomen/pelvis: Hepatic steatosis, aortic atherosclerosis 6/2>> MRI thoracic spine: T7-8 central to left-sided disc herniation 6/2>> MRI lumbar spine: No significant disc pathology/no stenosis or neurocompression.  Thrombosis of the IVC, both iliac veins to  the level of the IVC filter. 6/2>> lower extremity Doppler: Bilateral lower extremity extensive DVT.  Procedures: 6/8>>  #1: Ultrasound-guided access, bilateral popliteal vein  #2: Intravascular ultrasound, bilateral femoral, common femoral, external iliac, common iliac veins, and inferior vena cava #3: Placement of bilateral intravenous infusion catheter for thrombolytic therapy into the vena cava.                         #4: Initiation of thrombolytic therapy (TPA into the inferior vena cava, common iliac, external iliac, common femoral, and femoral veins  6/9>> 1.  Ultrasound-guided cannulation  of right internal jugular vein 2.  Ultrasound-guided cannulation of left internal jugular vein 3.  Ultrasound-guided placement of 7 French double-lumen central venous line and left internal jugular vein 4.  Intravascular ultrasound of bilateral femoral, bilateral common femoral, bilateral external iliac and bilateral common iliac veins and IVC 5.  IVC venogram 6.  Angio VAC and cleaner wire mechanical thrombectomy of IVC, bilateral common iliac, bilateral external iliac, bilateral common femoral and bilateral femoral veins 7.  Stent of IVC with 18 x 90 mm Wallstent 8.  Ultrasound-guided cannulation right common femoral vein 9.  Veno venous bypass for 58 minutes   COVID-19 medications: Steroids:6/1>>6/9 Remdesivir:6/1>>6/6  Antibiotics: Zithromax: 6/1>> 6/4 Rocephin: 6/1>> 6/4  Microbiology data: 6/1>> blood culture: Negative 6/2>> urine culture: Negative  Consults: VVS  DVT prophylaxis: IV argatoban    Subjective:   Off Levophed-some abdominal pain-but denies any nausea vomiting or diarrhea.  Has not had a bowel movement in the past several days.   Assessment  & Plan :   Sepsis secondary to Covid 19 Viral pneumonia: Sepsis physiology has resolved-has completed a course of steroids/remdesivir.  Remains stable on room air.  Fever: afebrile  O2 requirements:  SpO2: 96 % O2  Flow Rate (L/min): 2 L/min   COVID-19 Labs: Recent Labs    03/29/20 0615  CRP 1.8*       Component Value Date/Time   BNP 29.9 03/21/2020 2148    No results for input(s): PROCALCITON in the last 168 hours.  Lab Results  Component Value Date   SARSCOV2NAA POSITIVE (A) 03/10/2020    Prone/Incentive Spirometry: encouraged incentive spirometry use 3-4/hour.  Extensive bilateral lower extremity DVTs-involving bilateral iliac veins and extending to IVC up to the level of IVC filter: s/p bilateral popliteal lytic catheter placement on 6/8-and mechanical thrombectomy/IVC filter on 6/9-vascular surgery following.  Patient remains on argatroban-awaiting HIT antibody.  Transfer out of ICU today-mobilize-PT/OT eval ordered.  Hypotension: Secondary to blood loss-resuscitated with IV fluids-briefly required Levophed.  Currently BP stable-no longer on IV fluids or Levophed.  Continue supportive care.  Acute blood loss anemia: Secondary to perioperative blood loss-see operative note-follow CBC-no indication for transfusion.  Thrombocytopenia: Continues to have stable thrombocytopenia-remains on argatroban-HIT antibody pending.   AKI: Likely hemodynamically mediated-resolved  DM-2 with uncontrolled hyperglycemia with mild DKA: Initially on insulin infusion-CBGs currently stable with 10 units of Lantus daily, SSI-follow and adjust.  Recent Labs    03/31/20 0422 03/31/20 0628 03/31/20 1146  GLUCAP 104* 124* 136*    T7-T8 disc herniation: Prior MD-Dr. Waldron [facility] already discussed MRI findings with Dr. Jenkins-recommendations were for outpatient follow-up.  Hypothyroidism: Continue Synthroid.  TSH mildly elevated-not sure when her Synthroid dosing was adjusted last-stable for outpatient follow-up/adjustment with PCP.  HLD: Continue statin  Constipation: MiraLAX/senna added-reassess tomorrow.  Nutrition Problem: Nutrition Problem: Increased nutrient needs Etiology: acute illness  (COVID) Signs/Symptoms: estimated needs Interventions: Glucerna shake  ABG:    Component Value Date/Time   PHART 7.345 (L) 03/29/2020 1425   PCO2ART 42.2 03/29/2020 1425   PO2ART 200 (H) 03/29/2020 1425   HCO3 23.0 03/29/2020 1425   TCO2 24 03/29/2020 1425   ACIDBASEDEF 2.0 03/29/2020 1425   O2SAT 100.0 03/29/2020 1425    Vent Settings: N/A   Condition -Stable  Family Communication  : Called spouse-unable to leave voicemail-mailbox was full.  Code Status :  Full Code  Diet :  Diet Order            Diet Carb Modified Fluid consistency: Thin;  Room service appropriate? Yes  Diet effective now                  Disposition Plan  : Status is: Inpatient  Remains inpatient appropriate because:Inpatient level of care appropriate due to severity of illness  Dispo: The patient is from: Home              Anticipated d/c is to: Home              Anticipated d/c date is: 2 days              Patient currently is not medically stable to d/c.  Barriers to discharge: Extensive bilateral DVT-s/p catheter guided lytic mechanical thrombectomy-with severe thrombocytopenia-awaiting HIT antibody-remains on IV argatroban.  Move out of ICU today-needs to be mobilized by PT/OT  Antimicorbials  :    Anti-infectives (From admission, onward)   Start     Dose/Rate Route Frequency Ordered Stop   03/23/20 2200  azithromycin (ZITHROMAX) tablet 500 mg  Status:  Discontinued        500 mg Oral Daily at bedtime 03/23/20 0806 03/25/20 1233   03/23/20 1000  remdesivir 100 mg in sodium chloride 0.9 % 100 mL IVPB       "Followed by" Linked Group Details   100 mg 200 mL/hr over 30 Minutes Intravenous Daily 03/22/20 0110 03/26/20 1913   03/22/20 2200  azithromycin (ZITHROMAX) 500 mg in sodium chloride 0.9 % 250 mL IVPB  Status:  Discontinued        500 mg 250 mL/hr over 60 Minutes Intravenous Every 24 hours 03/22/20 0111 03/23/20 0806   03/22/20 2200  cefTRIAXone (ROCEPHIN) 1 g in sodium chloride 0.9  % 100 mL IVPB  Status:  Discontinued        1 g 200 mL/hr over 30 Minutes Intravenous Every 24 hours 03/22/20 0111 03/25/20 1233   03/22/20 0200  remdesivir 200 mg in sodium chloride 0.9% 250 mL IVPB       "Followed by" Linked Group Details   200 mg 580 mL/hr over 30 Minutes Intravenous Once 03/22/20 0110 03/22/20 1900   03/22/20 0015  cefTRIAXone (ROCEPHIN) 1 g in sodium chloride 0.9 % 100 mL IVPB        1 g 200 mL/hr over 30 Minutes Intravenous  Once 03/22/20 0010 03/22/20 0103   03/21/20 2300  cefTRIAXone (ROCEPHIN) injection 1 g  Status:  Discontinued        1 g Intramuscular  Once 03/21/20 2253 03/22/20 0010   03/21/20 2300  azithromycin (ZITHROMAX) 500 mg in sodium chloride 0.9 % 250 mL IVPB        500 mg 250 mL/hr over 60 Minutes Intravenous  Once 03/21/20 2253 03/22/20 0129      Inpatient Medications  Scheduled Meds: . vitamin C  500 mg Oral Daily  . Chlorhexidine Gluconate Cloth  6 each Topical Daily  . famotidine  20 mg Oral BID  . feeding supplement (GLUCERNA SHAKE)  237 mL Oral TID BM  . insulin aspart  0-15 Units Subcutaneous TID WC  . insulin aspart  0-5 Units Subcutaneous QHS  . insulin glargine  10 Units Subcutaneous Daily  . Ipratropium-Albuterol  1 puff Inhalation Q6H  . levothyroxine  100 mcg Oral Q0600  . linagliptin  5 mg Oral Daily  . multivitamin with minerals  1 tablet Oral Daily  . polyethylene glycol  17 g Oral BID  . senna-docusate  2 tablet Oral QHS  .  simvastatin  10 mg Oral Daily  . sodium chloride flush  10-40 mL Intracatheter Q12H  . sodium chloride flush  3 mL Intravenous Q12H  . zinc sulfate  220 mg Oral Daily   Continuous Infusions: . sodium chloride Stopped (03/31/20 0846)  . argatroban 0.5 mcg/kg/min (03/31/20 1100)  . sodium chloride     PRN Meds:.sodium chloride, acetaminophen, guaiFENesin-dextromethorphan, HYDROmorphone (DILAUDID) injection, loperamide, ondansetron **OR** ondansetron (ZOFRAN) IV, oxyCODONE, sodium chloride, sodium  chloride flush, traMADol, zolpidem   Time Spent in minutes  25  See all Orders from today for further details   Oren Binet M.D on 03/31/2020 at 12:23 PM  To page go to [email or web address] - use universal password  [facility] -  Office  [phone]    Objective:   Vitals:   03/31/20 0900 03/31/20 1000 03/31/20 1100 03/31/20 1148  BP: 128/78 129/80 129/81   Pulse: 81 87 80   Resp: (!) 21 (!) 25 (!) 21   Temp:    99.5 F (37.5 C)  TempSrc:    Oral  SpO2: 95% 96% 96%   Weight:      Height:        Wt Readings from Last 3 Encounters:  03/30/20 111.4 kg  02/15/20 91.2 kg  08/02/19 95.3 kg     Intake/Output Summary (Last 24 hours) at 03/31/2020 1223 Last data filed at 03/31/2020 1100 Gross per 24 hour  Intake 2179.91 ml  Output 1300 ml  Net 879.91 ml     Physical Exam Gen Exam:Alert awake-not in any distress HEENT:atraumatic, normocephalic Chest: B/L clear to auscultation anteriorly CVS:S1S2 regular Abdomen:soft non tender, non distended Extremities: Compression bandages in place bilaterally-difficult to assess if she still has edema. Neurology: Non focal Skin: no rash   Data Review:    CBC Recent Labs  Lab 03/25/20 0506 03/25/20 0506 03/26/20 0537 03/27/20 1136 03/29/20 0615 03/29/20 0949 03/29/20 1425 03/29/20 1614 03/30/20 0241 03/30/20 0922 03/31/20 0437  WBC 7.8   < > 7.7   < > 6.6  --   --  14.6* 12.6* 10.2 8.9  HGB 11.3*   < > 11.2*   < > 11.7*   < > 6.8* 12.4 10.2* 8.8* 8.7*  HCT 35.5*   < > 36.0   < > 36.7   < > 20.0* 36.7 28.8* 25.6* 25.5*  PLT 161   < > 183   < > 143*  --   --  PLATELET CLUMPS NOTED ON SMEAR, UNABLE TO ESTIMATE 69* 66* 68*  MCV 92.2   < > 93.0   < > 94.1  --   --  86.6 83.5 85.0 87.6  MCH 29.4   < > 28.9   < > 30.0  --   --  29.2 29.6 29.2 29.9  MCHC 31.8   < > 31.1   < > 31.9  --   --  33.8 35.4 34.4 34.1  RDW 13.9   < > 14.0   < > 15.0  --   --  15.3 16.5* 17.0* 17.2*  LYMPHSABS 0.8  --  1.4  --   --   --   --   1.4  --  1.1  --   MONOABS 0.2  --  0.5  --   --   --   --  1.0  --  0.6  --   EOSABS 0.0  --  0.0  --   --   --   --  0.0  --  0.0  --   BASOSABS 0.0  --  0.0  --   --   --   --  0.0  --  0.0  --    < > = values in this interval not displayed.    Chemistries  Recent Labs  Lab 03/27/20 1136 03/27/20 1136 03/28/20 0517 03/28/20 0517 03/29/20 0615 03/29/20 0949 03/29/20 1425 03/29/20 1614 03/30/20 0241 03/30/20 0922 03/31/20 0437  NA 135   < > 137   < > 137   < > 138 137 135 136 136  K 4.1   < > 4.4   < > 5.2*   < > 3.8 4.1 4.7 4.0 3.9  CL 102   < > 103   < > 105  --   --  108 106 107 108  CO2 26   < > 25   < > 26  --   --  19* 21* 22 23  GLUCOSE 214*   < > 164*   < > 182*  --   --  298* 211* 173* 135*  BUN 26*   < > 27*   < > 22*  --   --  18 21* 15 9  CREATININE 0.92   < > 0.89   < > 0.87  --   --  0.63 0.87 0.70 0.60  CALCIUM 9.1   < > 9.1   < > 8.8*  --   --  7.7* 7.7* 7.6* 7.9*  AST 20  --  18  --  31  --   --   --  16  --  15  ALT 30  --  28  --  35  --   --   --  22  --  22  ALKPHOS 73  --  68  --  82  --   --   --  40  --  47  BILITOT 0.7  --  0.7  --  1.6*  --   --   --  1.1  --  1.1   < > = values in this interval not displayed.   ------------------------------------------------------------------------------------------------------------------ No results for input(s): CHOL, HDL, LDLCALC, TRIG, CHOLHDL, LDLDIRECT in the last 72 hours.  Lab Results  Component Value Date   HGBA1C 8.6 (H) 03/23/2020   ------------------------------------------------------------------------------------------------------------------ No results for input(s): TSH, T4TOTAL, T3FREE, THYROIDAB in the last 72 hours.  Invalid input(s): FREET3 ------------------------------------------------------------------------------------------------------------------ No results for input(s): VITAMINB12, FOLATE, FERRITIN, TIBC, IRON, RETICCTPCT in the last 72 hours.  Coagulation profile No results for  input(s): INR, PROTIME in the last 168 hours.  No results for input(s): DDIMER in the last 72 hours.  Cardiac Enzymes No results for input(s): CKMB, TROPONINI, MYOGLOBIN in the last 168 hours.  Invalid input(s): CK ------------------------------------------------------------------------------------------------------------------    Component Value Date/Time   BNP 29.9 03/21/2020 2148    Micro Results Recent Results (from the past 240 hour(s))  Blood Culture (routine x 2)     Status: None   Collection Time: 03/21/20 10:00 PM   Specimen: BLOOD  Result Value Ref Range Status   Specimen Description BLOOD LEFT ANTECUBITAL  Final   Special Requests   Final    BOTTLES DRAWN AEROBIC AND ANAEROBIC Blood Culture results may not be optimal due to an inadequate volume of blood received in culture bottles   Culture   Final    NO GROWTH 5 DAYS Performed at [facility], [place] [address], [place], [place] [address]  Report Status 03/26/2020 FINAL  Final  Blood Culture (routine x 2)     Status: None   Collection Time: 03/21/20 11:41 PM   Specimen: BLOOD LEFT HAND  Result Value Ref Range Status   Specimen Description BLOOD LEFT HAND  Final   Special Requests   Final    BOTTLES DRAWN AEROBIC AND ANAEROBIC Blood Culture adequate volume   Culture   Final    NO GROWTH 5 DAYS Performed at [facility], [place] [address], [place], [place] [address]    Report Status 03/27/2020 FINAL  Final  Urine culture     Status: None   Collection Time: 03/22/20  1:48 AM   Specimen: In/Out Cath Urine  Result Value Ref Range Status   Specimen Description IN/OUT CATH URINE  Final   Special Requests NONE  Final   Culture   Final    NO GROWTH Performed at [facility], [place] [address] [place], [place] [address]    Report Status 03/22/2020 FINAL  Final  MRSA PCR Screening     Status: None   Collection Time: 03/22/20 12:32 PM   Specimen: Nasal Mucosa; Nasopharyngeal  Result Value Ref Range  Status   MRSA by PCR NEGATIVE NEGATIVE Final    Comment:        The GeneXpert MRSA Assay (FDA approved for NASAL specimens only), is one component of a comprehensive MRSA colonization surveillance program. It is not intended to diagnose MRSA infection nor to guide or monitor treatment for MRSA infections. Performed at [facility], [place] [address], [place], [place] [address]     Radiology Reports CT Angio Chest PE W and/or Wo Contrast  Result Date: 03/22/2020 CLINICAL DATA:  Severe back pain hypotension and shallow EXAM: CT ANGIOGRAPHY CHEST WITH CONTRAST TECHNIQUE: Multidetector CT imaging of the chest was performed using the standard protocol during bolus administration of intravenous contrast. Multiplanar CT image reconstructions and MIPs were obtained to evaluate the vascular anatomy. CONTRAST:  66mL OMNIPAQUE IOHEXOL 350 MG/ML SOLN COMPARISON:  None. FINDINGS: Cardiovascular: There is a optimal opacification of the pulmonary arteries. There is no central,segmental, or subsegmental filling defects within the pulmonary arteries. The heart is normal in size. No pericardial effusion or thickening. No evidence right heart strain. There is normal three-vessel brachiocephalic anatomy without proximal stenosis. The thoracic aorta is normal in appearance. Mediastinum/Nodes: No hilar, mediastinal, or axillary adenopathy. Thyroid gland, trachea, and esophagus demonstrate no significant findings. Lungs/Pleura: Multifocal patchy ground-glass opacities are seen predominantly within the periphery of both lungs. No pleural effusion or pneumothorax is seen. Upper Abdomen: No acute abnormalities present in the visualized portions of the upper abdomen. Musculoskeletal: No chest wall abnormality. No acute or significant osseous findings. Review of the MIP images confirms the above findings. Abdomen/pelvis: Hepatobiliary: There is diffuse low density seen throughout the liver parenchyma. The main portal vein  is patent. No evidence of calcified gallstones, gallbladder wall thickening or biliary dilatation. Pancreas: Unremarkable. No pancreatic ductal dilatation or surrounding inflammatory changes. Spleen: Normal in size without focal abnormality. Adrenals/Urinary Tract: Both adrenal glands appear normal. The kidneys and collecting system appear normal without evidence of urinary tract calculus or hydronephrosis. Bladder is unremarkable. Stomach/Bowel: The stomach, small bowel, and colon are normal in appearance. No inflammatory changes, wall thickening, or obstructive findings. There appears to be a surgical anastomosis at the sigmoid rectal junction. Mild presacral soft tissue density is again identified. Vascular/Lymphatic: There are no enlarged mesenteric, retroperitoneal, or pelvic lymph  nodes. An infrarenal IVC filter is seen at approximately the L3 level. There is scattered aortic and bi-iliac calcifications noted. Reproductive: The uterus and adnexa are unremarkable. Other: No evidence of abdominal wall mass or hernia. Musculoskeletal: No acute or significant osseous findings. IMPRESSION: No central, segmental, or subsegmental pulmonary embolism. Multifocal patchy airspace opacities throughout both lungs, likely consistent with multifocal pneumonia. Hepatic steatosis Aortic Atherosclerosis (ICD10-I70.0). Electronically Signed   By: Prudencio Pair M.D.   On: 03/22/2020 00:07   MR THORACIC SPINE W WO CONTRAST  Result Date: 03/22/2020 CLINICAL DATA:  Bilateral lower extremity pain and numbness with urinary retention. Diabetes. History of colon cancer. EXAM: MRI THORACIC WITHOUT AND WITH CONTRAST TECHNIQUE: Multiplanar and multiecho pulse sequences of the thoracic spine were obtained without and with intravenous contrast. CONTRAST:  9.35mL GADAVIST GADOBUTROL 1 MMOL/ML IV SOLN COMPARISON:  CT chest done yesterday. FINDINGS: MRI THORACIC SPINE FINDINGS Alignment:  Normal Vertebrae: Normal. No fracture or primary bone  lesion. No metastatic disease. Insignificant small hemangioma within the T11 vertebral body. Cord: No primary cord pathology. See below regarding stenosis at T7-8. Paraspinal and other soft tissues: Negative Disc levels: No abnormality at T5-6 or above. T6-7: Small right paracentral disc protrusion indents the thecal sac slightly but does not cause apparent neural compression. T7-8: Left posterolateral disc herniation effaces the ventral subarachnoid space and indents the left side of the cord. This could be symptomatic. T8-9 through T12-L1: Normal. 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 Ample subarachnoid space is present dorsal to the cord at this level. None the less, this could possibly be symptomatic. Small right paracentral disc herniation at T6-7 without apparent neural compression. Electronically Signed   By: Nelson Chimes M.D.   On: 03/22/2020 12:32   MR Lumbar Spine W Wo Contrast  Result Date: 03/22/2020 CLINICAL DATA:  Back pain.  Urinary retention.  Diabetes. EXAM: MRI LUMBAR SPINE WITHOUT AND WITH CONTRAST TECHNIQUE: Multiplanar and multiecho pulse sequences of the lumbar spine were obtained without and with intravenous contrast. CONTRAST:  9.67mL GADAVIST GADOBUTROL 1 MMOL/ML IV SOLN COMPARISON:  None. FINDINGS: Segmentation:  5 lumbar type vertebral bodies. Alignment:  Slightly exaggerated lower lumbar lordosis. Vertebrae:  No fracture or primary bone lesion. Conus medullaris and cauda equina: Conus extends to the L1 level. Conus and cauda equina appear normal. Paraspinal and other soft tissues: There appears be thrombosis of the IVC and both iliac veins up to the level of the IVC filter. Flow is restored in the IVC by inflow from the renal veins. Disc levels: No abnormality from L1-2 through L2-3. L3-4: No disc abnormality.  Mild facet hypertrophy.  No stenosis. L4-5: No disc  abnormality. Mild to moderate facet hypertrophy. No stenosis. L5-S1: Minimal disc bulge.  Mild facet hypertrophy.  No stenosis. IMPRESSION: No significant disc pathology. No stenosis or neural compression in the lumbar region. Patient has exaggerated lumbar lordosis. 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 Flow is restored in the IVC above that by inflow from the renal veins. I am in the process of calling this report. Electronically Signed   By: Nelson Chimes M.D.   On: 03/22/2020 12:46   CT ABDOMEN PELVIS W CONTRAST  Result Date: 03/22/2020 CLINICAL DATA:  Severe back pain hypotension and shallow EXAM: CT ANGIOGRAPHY CHEST WITH CONTRAST TECHNIQUE: Multidetector CT imaging of the chest was performed using the standard protocol during bolus administration of intravenous contrast. Multiplanar CT image reconstructions and MIPs were obtained to evaluate the vascular anatomy. CONTRAST:  40mL OMNIPAQUE IOHEXOL 350 MG/ML SOLN COMPARISON:  None. FINDINGS: Cardiovascular: There is a optimal opacification of the pulmonary arteries. There is no central,segmental, or subsegmental filling defects within the pulmonary arteries. The heart is normal in size. No pericardial effusion or thickening. No evidence right heart strain. There is normal three-vessel brachiocephalic anatomy without proximal stenosis. The thoracic aorta is normal in appearance. Mediastinum/Nodes: No hilar, mediastinal, or axillary adenopathy. Thyroid gland, trachea, and esophagus demonstrate no significant findings. Lungs/Pleura: Multifocal patchy ground-glass opacities are seen predominantly within the periphery of both lungs. No pleural effusion or pneumothorax is seen. Upper Abdomen: No acute abnormalities present in the visualized portions of the upper abdomen.  Musculoskeletal: No chest wall abnormality. No acute or significant osseous findings. Review of the MIP images confirms the above findings. Abdomen/pelvis: Hepatobiliary: There is diffuse low density seen throughout the liver parenchyma. The main portal vein is patent. No evidence of calcified gallstones, gallbladder wall thickening or biliary dilatation. Pancreas: Unremarkable. No pancreatic ductal dilatation or surrounding inflammatory changes. Spleen: Normal in size without focal abnormality. Adrenals/Urinary Tract: Both adrenal glands appear normal. The kidneys and collecting system appear normal without evidence of urinary tract calculus or hydronephrosis. Bladder is unremarkable. Stomach/Bowel: The stomach, small bowel, and colon are normal in appearance. No inflammatory changes, wall thickening, or obstructive findings. There appears to be a surgical anastomosis at the sigmoid rectal junction. Mild presacral soft tissue density is again identified. Vascular/Lymphatic: There are no enlarged mesenteric, retroperitoneal, or pelvic lymph nodes. An infrarenal IVC filter is seen at approximately the L3 level. There is scattered aortic and bi-iliac calcifications noted. Reproductive: The uterus and adnexa are unremarkable. Other: No evidence of abdominal wall mass or hernia. Musculoskeletal: No acute or significant osseous findings. IMPRESSION: No central, segmental, or subsegmental pulmonary embolism. Multifocal patchy airspace opacities throughout both lungs, likely consistent with multifocal pneumonia. Hepatic steatosis Aortic Atherosclerosis (ICD10-I70.0). Electronically Signed   By: Prudencio Pair M.D.   On: 03/22/2020 00:07   DG Chest Port 1 View  Result Date: 03/21/2020 CLINICAL DATA:  54 year old female with weakness and hypotension. EXAM: PORTABLE CHEST 1 VIEW COMPARISON:  Chest radiograph dated 03/25/2016 FINDINGS: Faint bilateral peripheral and subpleural densities, right greater left concerning for  pneumonia, possibly atypical or viral in etiology including COVID-19. Clinical correlation is recommended. No focal consolidation, pleural effusion, pneumothorax. The cardiac silhouette is within limits. No acute osseous pathology. IMPRESSION: Findings concerning for pneumonia. Electronically Signed   By: Anner Crete M.D.   On: 03/21/2020 22:17   VAS [place] LOWER EXTREMITY VENOUS (DVT)  Result Date: 03/22/2020  Lower Venous DVTStudy Indications: Hx DVT and IVC filter, now with COVID and elevated D dimer.  Comparison Study: 06/07/15 negative Performing Technologist: June Leap RDMS, RVT  Examination Guidelines: A complete evaluation includes B-mode imaging, spectral Doppler, color Doppler, and power Doppler as needed of all accessible portions of each vessel. Bilateral testing is considered an integral part of a complete examination. Limited examinations for reoccurring indications may be performed as noted. The reflux portion of the exam is performed with the patient in reverse Trendelenburg.  +---------+---------------+---------+-----------+----------+-----------------+ RIGHT    CompressibilityPhasicitySpontaneityPropertiesThrombus Aging    +---------+---------------+---------+-----------+----------+-----------------+ CFV  None           No       No                   Acute             +---------+---------------+---------+-----------+----------+-----------------+ SFJ      None                                         Acute             +---------+---------------+---------+-----------+----------+-----------------+ FV Prox  None                                         Acute             +---------+---------------+---------+-----------+----------+-----------------+ FV Mid   None                                         Acute             +---------+---------------+---------+-----------+----------+-----------------+ FV DistalNone                                         Acute              +---------+---------------+---------+-----------+----------+-----------------+ PFV      None                                         Acute             +---------+---------------+---------+-----------+----------+-----------------+ POP      None           No       No                   Acute             +---------+---------------+---------+-----------+----------+-----------------+ PTV      None                                         Acute             +---------+---------------+---------+-----------+----------+-----------------+ PERO     None                                         Acute             +---------+---------------+---------+-----------+----------+-----------------+ Gastroc  None                                         Acute             +---------+---------------+---------+-----------+----------+-----------------+ GSV      None  Acute             +---------+---------------+---------+-----------+----------+-----------------+ Ex Iliac                No       No                   Age Indeterminate +---------+---------------+---------+-----------+----------+-----------------+ Com Iliac                                             Not visualized    +---------+---------------+---------+-----------+----------+-----------------+   +---------+---------------+---------+-----------+----------+-----------------+ LEFT     CompressibilityPhasicitySpontaneityPropertiesThrombus Aging    +---------+---------------+---------+-----------+----------+-----------------+ CFV      None           No       No                   Acute             +---------+---------------+---------+-----------+----------+-----------------+ SFJ      None                                         Acute             +---------+---------------+---------+-----------+----------+-----------------+ FV Prox  None                                          Acute             +---------+---------------+---------+-----------+----------+-----------------+ FV Mid   None                                         Acute             +---------+---------------+---------+-----------+----------+-----------------+ FV DistalNone                                         Acute             +---------+---------------+---------+-----------+----------+-----------------+ PFV      None                                         Acute             +---------+---------------+---------+-----------+----------+-----------------+ POP      None           No       No                   Acute             +---------+---------------+---------+-----------+----------+-----------------+ PTV      None                                         Acute             +---------+---------------+---------+-----------+----------+-----------------+ PERO  None                                         Acute             +---------+---------------+---------+-----------+----------+-----------------+ Gastroc  None                                         Acute             +---------+---------------+---------+-----------+----------+-----------------+ GSV      None                                         Acute             +---------+---------------+---------+-----------+----------+-----------------+ Ex Iliac                No       No                   Age Indeterminate +---------+---------------+---------+-----------+----------+-----------------+ Com Iliac                                             Not visualized    +---------+---------------+---------+-----------+----------+-----------------+     Summary: RIGHT: - Findings consistent with acute deep vein thrombosis involving the right common femoral vein, SF junction, right femoral vein, right proximal profunda vein, right popliteal vein, right posterior tibial veins, right peroneal veins,  and right gastrocnemius veins. - Findings consistent with acute superficial vein thrombosis involving the right great saphenous vein. - Thrombosis extends into the Right Iliac vein and IVC up to the level of the filter placement.  LEFT: - Findings consistent with acute deep vein thrombosis involving the left common femoral vein, SF junction, left femoral vein, left proximal profunda vein, left popliteal vein, left posterior tibial veins, left peroneal veins, and left gastrocnemius veins. - Findings consistent with acute superficial vein thrombosis involving the left great saphenous vein. - Thrombosis extends into the Left Iliac vein.  *See table(s) above for measurements and observations. Electronically signed by Curt Jews MD on 03/22/2020 at 5:19:17 PM.    Final    HYBRID OR IMAGING ([place])  Result Date: 03/29/2020 There is no interpretation for this exam.  This order is for images obtained during a surgical procedure.  Please See "Surgeries" Tab for more information regarding the procedure.   HYBRID OR IMAGING ([facility] ONLY)  Result Date: 03/28/2020 There is no interpretation for this exam.  This order is for images obtained during a surgical procedure.  Please See "Surgeries" Tab for more information regarding the procedure.

## 2020-03-31 NOTE — Progress Notes (Addendum)
ANTICOAGULATION CONSULT NOTE - Follow Up Consult  Pharmacy Consult for heparin>>argatroban Indication: clotted IVC and DVTs  Labs: Recent Labs    03/28/20 1357 03/29/20 0615 03/29/20 0949 03/30/20 0241 03/30/20 0241 03/30/20 0730 03/30/20 0922 03/30/20 1231 03/31/20 0437  HGB  --  11.7*   < > 10.2*   < >  --  8.8*  --  8.7*  HCT  --  36.7   < > 28.8*  --   --  25.6*  --  25.5*  PLT  --  143*   < > 69*  --   --  66*  --  68*  APTT  --   --   --   --   --  100*  --  83* 79*  HEPARINUNFRC 0.38 0.22*  --  0.20*  --   --   --   --   --   CREATININE  --  0.87   < > 0.87  --   --  0.70  --  0.60   < > = values in this interval not displayed.    Assessment: 54yo female subtherapeutic on heparin after resumed s/p mechanical thrombectomy; patient now has been transitioned to argatroban d/t thrombocytopenia. No gtt issues or signs of bleeding per RN.  Aptt continue to be at goal on 0.70mcg/kg/min after rate adjustment yesterday. Hgb stable at 8.7, plt stable at 68.  Goal of Therapy:  Aptt goal 50-90s   Plan:  Continue argatroban at 0.37mcg/kg/min Aptt daily Hit antibody in process  Erin Hearing PharmD., BCPS Clinical Pharmacist 03/31/2020 8:27 AM

## 2020-04-01 LAB — CBC
HCT: 27.2 % — ABNORMAL LOW (ref 36.0–46.0)
Hemoglobin: 9 g/dL — ABNORMAL LOW (ref 12.0–15.0)
MCH: 29.8 pg (ref 26.0–34.0)
MCHC: 33.1 g/dL (ref 30.0–36.0)
MCV: 90.1 fL (ref 80.0–100.0)
Platelets: 74 10*3/uL — ABNORMAL LOW (ref 150–400)
RBC: 3.02 MIL/uL — ABNORMAL LOW (ref 3.87–5.11)
RDW: 16.5 % — ABNORMAL HIGH (ref 11.5–15.5)
WBC: 8.6 10*3/uL (ref 4.0–10.5)
nRBC: 0 % (ref 0.0–0.2)

## 2020-04-01 LAB — BPAM FFP
Blood Product Expiration Date: 202106102359
Blood Product Expiration Date: 202106132359
ISSUE DATE / TIME: 202106091355
ISSUE DATE / TIME: 202106101407
Unit Type and Rh: 6200
Unit Type and Rh: 6200

## 2020-04-01 LAB — PREPARE FRESH FROZEN PLASMA: Unit division: 0

## 2020-04-01 LAB — GLUCOSE, CAPILLARY
Glucose-Capillary: 124 mg/dL — ABNORMAL HIGH (ref 70–99)
Glucose-Capillary: 111 mg/dL — ABNORMAL HIGH (ref 70–99)
Glucose-Capillary: 105 mg/dL — ABNORMAL HIGH (ref 70–99)
Glucose-Capillary: 99 mg/dL (ref 70–99)

## 2020-04-01 LAB — APTT: aPTT: 75 seconds — ABNORMAL HIGH (ref 24–36)

## 2020-04-01 MED ORDER — ALBUTEROL SULFATE (2.5 MG/3ML) 0.083% IN NEBU: 2.5000 mg | INHALATION_SOLUTION | Freq: Four times a day (QID) | RESPIRATORY_TRACT | Status: DC | PRN

## 2020-04-01 MED ORDER — BISACODYL 10 MG RE SUPP
10.0000 mg | Freq: Every day | RECTAL | Status: DC | PRN
  Administered 2020-04-01 – 2020-04-05 (×2): 10 mg via RECTAL
  Filled 2020-04-01 (×2): qty 1

## 2020-04-01 NOTE — Evaluation (Signed)
Occupational Therapy Evaluation Patient Details Name: [name] MRN: [id number] DOB: [birth year] Today's Date: 04/01/2020    History of Present Illness Patient is a 54 y.o. female with PMHx of DM-2, HLD, hypothyroidism, PE-s/p IVC filter in 2012, history of colon cancer-diagnosed with COVID-19 on 5/21-presented with fatigue/malaise, low back pain-found to have patchy airspace disease on CTA chest, MRI thoracic/lumbar spine showed T7 disc herniation-but also showed extensive clot in the IVC.  s/p bilateral popliteal lytic catheter placement on 6/8-and mechanical thrombectomy/IVC filter on 6/9   Clinical Impression   This 54 y/o female presents with the above. PTA pt reports independence with ADL, iADL and functional mobility, living at home with spouse and 38 y/o son. Pt agreeable to working with therapies with min encouragement today, endorses fatigue as she recently sat up/worked with mobility tech prior to session start. Pt with additional limitations due to  generalized weakness and deconditioning, nausea with mobility/activity today impacting her overall functional performance. Pt tolerating sitting EOB and sit<>stand at RW with two person assist for functional transfers. Pt with worsening nausea with standing and requiring return to sitting. VSS throughout. She currently requires max-totalA for LB/toileting ADL, minguard assist for seated UB and grooming ADL. Pt with good motivation to return to her PLOF. She will benefit from continued acute OT services and currently feel she will benefit from CIR level therapies to maximize her overall safety and independence with ADL and mobility. Will follow.     Follow Up Recommendations  CIR    Equipment Recommendations  Tub/shower seat;Other (comment) (to be further assessed )    Recommendations for Other Services Rehab consult     Precautions / Restrictions Precautions Precautions: Fall Restrictions Weight Bearing Restrictions: No       Mobility Bed Mobility Overal bed mobility: Modified Independent             General bed mobility comments: Extra time however good power-up to EOB. Slower return to bed, mostly due to nausea. Able to bridge in bed for bed pan placement.  Transfers Overall transfer level: Needs assistance Equipment used: Rolling walker (2 wheeled) Transfers: Sit to/from Stand Sit to Stand: Min assist;+2 physical assistance         General transfer comment: Min assist for boost to stand +2 for safety and balance. No buckling noted. Short duration tolerated due to nausea and bowel urgency. Cues for walker placement, effortful rise but good control with light RW support once standing.    Balance Overall balance assessment: Needs assistance Sitting-balance support: No upper extremity supported;Feet supported Sitting balance-Leahy Scale: Fair     Standing balance support: Bilateral upper extremity supported Standing balance-Leahy Scale: Poor Standing balance comment: Pt a bit anxious standing, nauseous, likely able to remove hands but not this date.                           ADL either performed or assessed with clinical judgement   ADL Overall ADL's : Needs assistance/impaired Eating/Feeding: Set up;Sitting   Grooming: Wash/dry face;Set up;Supervision/safety;Sitting Grooming Details (indicate cue type and reason): seated EOB Upper Body Bathing: Min guard;Sitting   Lower Body Bathing: Moderate assistance;+2 for physical assistance;Sitting/lateral leans;Sit to/from stand   Upper Body Dressing : Min guard;Set up;Sitting   Lower Body Dressing: Moderate assistance;+2 for physical assistance;Sitting/lateral leans;Sit to/from stand Lower Body Dressing Details (indicate cue type and reason): minA+2 sit<>stand     Toileting- Clothing Manipulation and Hygiene: Maximal assistance;Total assistance;Bed level  Toileting - Clothing Manipulation Details (indicate cue type and reason): pt  requesting to use bed pan end of session, declined getting up to [facility] - due to nausea with mobility and pt recently sat up EOB prior to start of session      Functional mobility during ADLs: Minimal assistance;+2 for physical assistance;Rolling walker General ADL Comments: pt with weakness, decreased activity tolerance, additional limitations due to nausea with mobility today      Vision         Perception     Praxis      Pertinent Vitals/Pain Pain Assessment: No/denies pain     Hand Dominance     Extremity/Trunk Assessment Upper Extremity Assessment Upper Extremity Assessment: Generalized weakness   Lower Extremity Assessment Lower Extremity Assessment: Defer to PT evaluation       Communication Communication Communication: No difficulties   Cognition Arousal/Alertness: Awake/alert Behavior During Therapy: WFL for tasks assessed/performed Overall Cognitive Status: Within Functional Limits for tasks assessed                                     General Comments  VSS during assessment, SpO2 96% on room air. Mild dyspnea at rest and with mobility.     Exercises Exercises: General Lower Extremity General Exercises - Lower Extremity Ankle Circles/Pumps: AROM;Both;10 reps;Supine Quad Sets: Strengthening;Both;10 reps;Supine Gluteal Sets: Strengthening;Both;10 reps;Supine Heel Slides: Strengthening;Both;10 reps;Supine   Shoulder Instructions      Home Living Family/patient expects to be discharged to:: Private residence Living Arrangements: Spouse/significant other Available Help at Discharge: Family (Husband works and cares for his father. 22 yo son home) Type of Home: House Home Access: Stairs to enter [employer] of Steps: 3 Entrance Stairs-Rails: Can reach both Home Layout: Two level;Able to live on main level with bedroom/bathroom     Bathroom Shower/Tub: Occupational psychologist: Standard     Home Equipment: None           Prior Functioning/Environment Level of Independence: Independent        Comments: worked from home for [employer]        OT Problem List: Decreased strength;Decreased range of motion;Decreased activity tolerance;Impaired balance (sitting and/or standing);Cardiopulmonary status limiting activity;Decreased knowledge of use of DME or AE      OT Treatment/Interventions: Self-care/ADL training;Therapeutic exercise;Energy conservation;DME and/or AE instruction;Therapeutic activities;Patient/family education;Balance training    OT Goals(Current goals can be found in the care plan section) Acute Rehab OT Goals Patient Stated Goal: Get well and go home as soon as possible OT Goal Formulation: With patient Time For Goal Achievement: 04/15/20 Potential to Achieve Goals: Good  OT Frequency: Min 2X/week   Barriers to D/C:            Co-evaluation PT/OT/SLP Co-Evaluation/Treatment: Yes Reason for Co-Treatment: Complexity of the patient's impairments (multi-system involvement);For patient/therapist safety;To address functional/ADL transfers PT goals addressed during session: Mobility/safety with mobility;Balance;Proper use of DME;Strengthening/ROM OT goals addressed during session: ADL's and self-care      AM-PAC OT "6 Clicks" Daily Activity     Outcome Measure Help from another person eating meals?: A Little Help from another person taking care of personal grooming?: A Little Help from another person toileting, which includes using toliet, bedpan, or urinal?: A Lot Help from another person bathing (including washing, rinsing, drying)?: A Lot Help from another person to put on and taking off regular upper body clothing?:  A Little Help from another person to put on and taking off regular lower body clothing?: A Lot 6 Click Score: 15   End of Session Nurse Communication: Mobility status (pt on bedpan)  Activity Tolerance: Patient tolerated treatment well;Patient  limited by fatigue Patient left: in bed;with call bell/phone within reach  OT Visit Diagnosis: Muscle weakness (generalized) (M62.81);Unsteadiness on feet (R26.81)                Time: 0349-6116 OT Time Calculation (min): 23 min Charges:  OT General Charges $OT Visit: 1 Visit OT Evaluation $OT Eval Moderate Complexity: [place], OT Acute Rehabilitation Services Pager [phone] Office [phone]   Raymondo Band 04/01/2020, 4:56 PM

## 2020-04-01 NOTE — Progress Notes (Signed)
Patient admitted to 4E17 from 2 Heart. Vital signs stable, Patient oriented to unit Call bell and bedside phone within reach.

## 2020-04-01 NOTE — Progress Notes (Signed)
Pt's left internal jugular CVC removed per MD order.  Pt tolerated well. No complications to report at this time.  Pt will be on bed rest and vitals taken per protocol.  Pt will continue to be monitored.

## 2020-04-01 NOTE — Progress Notes (Signed)
PROGRESS NOTE                                                                                                                                                                                                             Patient Demographics:    [name], is a 54 y.o. female, DOB - 1966/08/25, ZOX:[id number]  Outpatient Primary MD for the patient is Glendale Chard, MD   Admit date - 03/21/2020   LOS - 10  Chief Complaint  Patient presents with  . Hypotension  . Back Pain       Brief Narrative: Patient is a 54 y.o. female with PMHx of DM-2, HLD, hypothyroidism, PE-s/p IVC filter in 2012, history of colon cancer-diagnosed with COVID-19 on 5/21-presented with fatigue/malaise, low back pain-found to have patchy airspace disease on CTA chest, MRI thoracic/lumbar spine showed T7 disc herniation-but also showed extensive clot in the IVC.  Subsequently a Doppler showed extensive DVT in the bilateral lower extremity.  She was started on IV heparin-vascular surgery was consulted.  See below for further details.  Significant Events: 6/1>> admit to [facility] for low back pain/weakness/malaise-found to have COVID-19 pneumonia on CT imaging. 6/2>> lower extremity Doppler/MRI LS spine-significant lower extremity DVT extending up to IVC filter 6/8>> catheter guided lytic therapy for bilateral lower extremity DVT 6/9>> mechanical thrombectomy/IVC filter removal 6/10>> hypotension overnight-requiring initiation of Levophed 6/11>> off Levophed since 6/10 evening-transfer out of ICU  Significant studies: 6/1>> CTA chest: No PE-multifocal patchy airspace opacities throughout both lungs. 6/1>> CT abdomen/pelvis: Hepatic steatosis, aortic atherosclerosis 6/2>> MRI thoracic spine: T7-8 central to left-sided disc herniation 6/2>> MRI lumbar spine: No significant disc pathology/no stenosis or neurocompression.  Thrombosis of the IVC, both iliac veins to  the level of the IVC filter. 6/2>> lower extremity Doppler: Bilateral lower extremity extensive DVT.  Procedures: 6/8>>  #1: Ultrasound-guided access, bilateral popliteal vein  #2: Intravascular ultrasound, bilateral femoral, common femoral, external iliac, common iliac veins, and inferior vena cava #3: Placement of bilateral intravenous infusion catheter for thrombolytic therapy into the vena cava.                         #4: Initiation of thrombolytic therapy (TPA into the inferior vena cava, common iliac, external iliac, common femoral, and femoral veins  6/9>> 1.  Ultrasound-guided cannulation  of right internal jugular vein 2.  Ultrasound-guided cannulation of left internal jugular vein 3.  Ultrasound-guided placement of 7 French double-lumen central venous line and left internal jugular vein 4.  Intravascular ultrasound of bilateral femoral, bilateral common femoral, bilateral external iliac and bilateral common iliac veins and IVC 5.  IVC venogram 6.  Angio VAC and cleaner wire mechanical thrombectomy of IVC, bilateral common iliac, bilateral external iliac, bilateral common femoral and bilateral femoral veins 7.  Stent of IVC with 18 x 90 mm Wallstent 8.  Ultrasound-guided cannulation right common femoral vein 9.  Veno venous bypass for 58 minutes   COVID-19 medications: Steroids:6/1>>6/9 Remdesivir:6/1>>6/6  Antibiotics: Zithromax: 6/1>> 6/4 Rocephin: 6/1>> 6/4  Microbiology data: 6/1>> blood culture: Negative 6/2>> urine culture: Negative  Consults: VVS  DVT prophylaxis: IV argatoban    Subjective:   Feels better-thinks she has less swelling her legs.  No bowel movement for the past few days.  Does not have an appetite   Assessment  & Plan :   Sepsis secondary to Covid 19 Viral pneumonia: Sepsis physiology has resolved-has completed a course of steroids/remdesivir.  Remains stable on room air.  Fever: afebrile  O2 requirements:  SpO2: 98 % O2 Flow Rate  (L/min): 2 L/min   COVID-19 Labs: No results for input(s): DDIMER, FERRITIN, LDH, CRP in the last 72 hours.     Component Value Date/Time   BNP 29.9 03/21/2020 2148    No results for input(s): PROCALCITON in the last 168 hours.  Lab Results  Component Value Date   SARSCOV2NAA POSITIVE (A) 03/10/2020    Prone/Incentive Spirometry: encouraged incentive spirometry use 3-4/hour.  Extensive bilateral lower extremity DVTs-involving bilateral iliac veins and extending to IVC up to the level of IVC filter: s/p bilateral popliteal lytic catheter placement on 6/8-and mechanical thrombectomy/IVC filter on 6/9-vascular surgery following.  Switched to argatroban due to thrombocytopenia-HIT antibody negative.  Suspect she can be transitioned to Eliquis or Xarelto once her appetite stabilizes a bit.    Hypovolemic shock: Resolved-secondary to blood loss-resuscitated with IV fluids-briefly required Levophed while in the ICU.  BP now stable.  Acute blood loss anemia: Secondary to perioperative blood loss-see operative note-follow CBC-no indication for transfusion.  Thrombocytopenia: Continues to have stable thrombocytopenia-remains on argatroban-HIT antibody negative.  Platelet count slowly improving-okay to transition to oral anticoagulant (either Eliquis or Xarelto) in the next day or so.  AKI: Likely hemodynamically mediated-resolved  DM-2 with uncontrolled hyperglycemia with mild DKA: Initially on insulin infusion-CBGs currently stable with 10 units of Lantus daily, SSI-follow and adjust.  Recent Labs    03/31/20 2027 04/01/20 0606 04/01/20 1106  GLUCAP 122* 124* 111*    T7-T8 disc herniation: Prior MD-Dr. Waldron Labs already discussed MRI findings with Dr. Jenkins-recommendations were for outpatient follow-up.  Hypothyroidism: Continue Synthroid.  TSH mildly elevated-not sure when her Synthroid dosing was adjusted last-stable for outpatient follow-up/adjustment with PCP.  HLD: Continue  statin  Constipation: No response to MiraLAX/senna-have asked nurse to give Dulcolax suppository x1  Nutrition Problem: Nutrition Problem: Increased nutrient needs Etiology: acute illness (COVID) Signs/Symptoms: estimated needs Interventions: Glucerna shake  ABG:    Component Value Date/Time   PHART 7.345 (L) 03/29/2020 1425   PCO2ART 42.2 03/29/2020 1425   PO2ART 200 (H) 03/29/2020 1425   HCO3 23.0 03/29/2020 1425   TCO2 24 03/29/2020 1425   ACIDBASEDEF 2.0 03/29/2020 1425   O2SAT 100.0 03/29/2020 1425    Vent Settings: N/A   Condition -Stable  Family Communication  : Spouse  over the phone on 6/12  Code Status :  Full Code  Diet :  Diet Order            Diet Carb Modified Fluid consistency: Thin; Room service appropriate? Yes  Diet effective now                  Disposition Plan  : Status is: Inpatient  Remains inpatient appropriate because:Inpatient level of care appropriate due to severity of illness  Dispo: The patient is from: Home              Anticipated d/c is to: Home              Anticipated d/c date is: 2 days              Patient currently is not medically stable to d/c.  Barriers to discharge: Extensive bilateral DVT-s/p catheter guided lytic mechanical thrombectomy-on IV argatroban  Antimicorbials  :    Anti-infectives (From admission, onward)   Start     Dose/Rate Route Frequency Ordered Stop   03/23/20 2200  azithromycin (ZITHROMAX) tablet 500 mg  Status:  Discontinued        500 mg Oral Daily at bedtime 03/23/20 0806 03/25/20 1233   03/23/20 1000  remdesivir 100 mg in sodium chloride 0.9 % 100 mL IVPB       "Followed by" Linked Group Details   100 mg 200 mL/hr over 30 Minutes Intravenous Daily 03/22/20 0110 03/26/20 1913   03/22/20 2200  azithromycin (ZITHROMAX) 500 mg in sodium chloride 0.9 % 250 mL IVPB  Status:  Discontinued        500 mg 250 mL/hr over 60 Minutes Intravenous Every 24 hours 03/22/20 0111 03/23/20 0806   03/22/20  2200  cefTRIAXone (ROCEPHIN) 1 g in sodium chloride 0.9 % 100 mL IVPB  Status:  Discontinued        1 g 200 mL/hr over 30 Minutes Intravenous Every 24 hours 03/22/20 0111 03/25/20 1233   03/22/20 0200  remdesivir 200 mg in sodium chloride 0.9% 250 mL IVPB       "Followed by" Linked Group Details   200 mg 580 mL/hr over 30 Minutes Intravenous Once 03/22/20 0110 03/22/20 1900   03/22/20 0015  cefTRIAXone (ROCEPHIN) 1 g in sodium chloride 0.9 % 100 mL IVPB        1 g 200 mL/hr over 30 Minutes Intravenous  Once 03/22/20 0010 03/22/20 0103   03/21/20 2300  cefTRIAXone (ROCEPHIN) injection 1 g  Status:  Discontinued        1 g Intramuscular  Once 03/21/20 2253 03/22/20 0010   03/21/20 2300  azithromycin (ZITHROMAX) 500 mg in sodium chloride 0.9 % 250 mL IVPB        500 mg 250 mL/hr over 60 Minutes Intravenous  Once 03/21/20 2253 03/22/20 0129      Inpatient Medications  Scheduled Meds: . vitamin C  500 mg Oral Daily  . Chlorhexidine Gluconate Cloth  6 each Topical Daily  . famotidine  20 mg Oral BID  . feeding supplement (GLUCERNA SHAKE)  237 mL Oral TID BM  . insulin aspart  0-15 Units Subcutaneous TID WC  . insulin aspart  0-5 Units Subcutaneous QHS  . insulin glargine  10 Units Subcutaneous Daily  . levothyroxine  100 mcg Oral Q0600  . linagliptin  5 mg Oral Daily  . multivitamin with minerals  1 tablet Oral Daily  . polyethylene glycol  17 g Oral  BID  . senna-docusate  2 tablet Oral QHS  . simvastatin  10 mg Oral Daily  . sodium chloride flush  10-40 mL Intracatheter Q12H  . sodium chloride flush  3 mL Intravenous Q12H  . zinc sulfate  220 mg Oral Daily   Continuous Infusions: . sodium chloride Stopped (03/31/20 0846)  . argatroban 0.5 mcg/kg/min (04/01/20 1157)  . sodium chloride     PRN Meds:.sodium chloride, acetaminophen, albuterol, bisacodyl, guaiFENesin-dextromethorphan, HYDROmorphone (DILAUDID) injection, loperamide, ondansetron **OR** ondansetron (ZOFRAN) IV,  oxyCODONE, sodium chloride, sodium chloride flush, traMADol, zolpidem   Time Spent in minutes  25  See all Orders from today for further details   Oren Binet M.D on 04/01/2020 at 1:33 PM  To page go to [email or web address] - use universal password  [facility] -  Office  [phone]    Objective:   Vitals:   04/01/20 0036 04/01/20 0435 04/01/20 0810 04/01/20 1103  BP: 131/80 137/84 137/80 (!) 148/83  Pulse:  74 77 73  Resp: 20 18 18 17   Temp: 98.7 F (37.1 C) 98.4 F (36.9 C) 98.4 F (36.9 C) 98.3 F (36.8 C)  TempSrc: Oral Oral Oral Oral  SpO2: 94% 96% 97% 98%  Weight:      Height:        Wt Readings from Last 3 Encounters:  03/30/20 111.4 kg  02/15/20 91.2 kg  08/02/19 95.3 kg     Intake/Output Summary (Last 24 hours) at 04/01/2020 1333 Last data filed at 04/01/2020 1103 Gross per 24 hour  Intake 53.42 ml  Output 1700 ml  Net -1646.58 ml     Physical Exam Gen Exam:Alert awake-not in any distress HEENT:atraumatic, normocephalic Chest: B/L clear to auscultation anteriorly CVS:S1S2 regular Abdomen:soft non tender, non distended Extremities: Compression bandages in place bilaterally-difficult to assess if she still has edema. Neurology: Non focal Skin: no rash   Data Review:    CBC Recent Labs  Lab 03/26/20 0537 03/27/20 1136 03/29/20 1614 03/30/20 0241 03/30/20 0922 03/31/20 0437 04/01/20 0354  WBC 7.7   < > 14.6* 12.6* 10.2 8.9 8.6  HGB 11.2*   < > 12.4 10.2* 8.8* 8.7* 9.0*  HCT 36.0   < > 36.7 28.8* 25.6* 25.5* 27.2*  PLT 183   < > PLATELET CLUMPS NOTED ON SMEAR, UNABLE TO ESTIMATE 69* 66* 68* 74*  MCV 93.0   < > 86.6 83.5 85.0 87.6 90.1  MCH 28.9   < > 29.2 29.6 29.2 29.9 29.8  MCHC 31.1   < > 33.8 35.4 34.4 34.1 33.1  RDW 14.0   < > 15.3 16.5* 17.0* 17.2* 16.5*  LYMPHSABS 1.4  --  1.4  --  1.1  --   --   MONOABS 0.5  --  1.0  --  0.6  --   --   EOSABS 0.0  --  0.0  --  0.0  --   --   BASOSABS 0.0  --  0.0  --  0.0  --   --    <  > = values in this interval not displayed.    Chemistries  Recent Labs  Lab 03/27/20 1136 03/27/20 1136 03/28/20 0517 03/28/20 0517 03/29/20 0615 03/29/20 0949 03/29/20 1425 03/29/20 1614 03/30/20 0241 03/30/20 0922 03/31/20 0437  NA 135   < > 137   < > 137   < > 138 137 135 136 136  K 4.1   < > 4.4   < > 5.2*   < > 3.8  4.1 4.7 4.0 3.9  CL 102   < > 103   < > 105  --   --  108 106 107 108  CO2 26   < > 25   < > 26  --   --  19* 21* 22 23  GLUCOSE 214*   < > 164*   < > 182*  --   --  298* 211* 173* 135*  BUN 26*   < > 27*   < > 22*  --   --  18 21* 15 9  CREATININE 0.92   < > 0.89   < > 0.87  --   --  0.63 0.87 0.70 0.60  CALCIUM 9.1   < > 9.1   < > 8.8*  --   --  7.7* 7.7* 7.6* 7.9*  AST 20  --  18  --  31  --   --   --  16  --  15  ALT 30  --  28  --  35  --   --   --  22  --  22  ALKPHOS 73  --  68  --  82  --   --   --  40  --  47  BILITOT 0.7  --  0.7  --  1.6*  --   --   --  1.1  --  1.1   < > = values in this interval not displayed.   ------------------------------------------------------------------------------------------------------------------ No results for input(s): CHOL, HDL, LDLCALC, TRIG, CHOLHDL, LDLDIRECT in the last 72 hours.  Lab Results  Component Value Date   HGBA1C 8.6 (H) 03/23/2020   ------------------------------------------------------------------------------------------------------------------ No results for input(s): TSH, T4TOTAL, T3FREE, THYROIDAB in the last 72 hours.  Invalid input(s): FREET3 ------------------------------------------------------------------------------------------------------------------ No results for input(s): VITAMINB12, FOLATE, FERRITIN, TIBC, IRON, RETICCTPCT in the last 72 hours.  Coagulation profile No results for input(s): INR, PROTIME in the last 168 hours.  No results for input(s): DDIMER in the last 72 hours.  Cardiac Enzymes No results for input(s): CKMB, TROPONINI, MYOGLOBIN in the last 168  hours.  Invalid input(s): CK ------------------------------------------------------------------------------------------------------------------    Component Value Date/Time   BNP 29.9 03/21/2020 2148    Micro Results No results found for this or any previous visit (from the past 240 hour(s)).  Radiology Reports CT Angio Chest PE W and/or Wo Contrast  Result Date: 03/22/2020 CLINICAL DATA:  Severe back pain hypotension and shallow EXAM: CT ANGIOGRAPHY CHEST WITH CONTRAST TECHNIQUE: Multidetector CT imaging of the chest was performed using the standard protocol during bolus administration of intravenous contrast. Multiplanar CT image reconstructions and MIPs were obtained to evaluate the vascular anatomy. CONTRAST:  51mL OMNIPAQUE IOHEXOL 350 MG/ML SOLN COMPARISON:  None. FINDINGS: Cardiovascular: There is a optimal opacification of the pulmonary arteries. There is no central,segmental, or subsegmental filling defects within the pulmonary arteries. The heart is normal in size. No pericardial effusion or thickening. No evidence right heart strain. There is normal three-vessel brachiocephalic anatomy without proximal stenosis. The thoracic aorta is normal in appearance. Mediastinum/Nodes: No hilar, mediastinal, or axillary adenopathy. Thyroid gland, trachea, and esophagus demonstrate no significant findings. Lungs/Pleura: Multifocal patchy ground-glass opacities are seen predominantly within the periphery of both lungs. No pleural effusion or pneumothorax is seen. Upper Abdomen: No acute abnormalities present in the visualized portions of the upper abdomen. Musculoskeletal: No chest wall abnormality. No acute or significant osseous findings. Review of the MIP images confirms the above findings. Abdomen/pelvis: Hepatobiliary: There is  diffuse low density seen throughout the liver parenchyma. The main portal vein is patent. No evidence of calcified gallstones, gallbladder wall thickening or biliary  dilatation. Pancreas: Unremarkable. No pancreatic ductal dilatation or surrounding inflammatory changes. Spleen: Normal in size without focal abnormality. Adrenals/Urinary Tract: Both adrenal glands appear normal. The kidneys and collecting system appear normal without evidence of urinary tract calculus or hydronephrosis. Bladder is unremarkable. Stomach/Bowel: The stomach, small bowel, and colon are normal in appearance. No inflammatory changes, wall thickening, or obstructive findings. There appears to be a surgical anastomosis at the sigmoid rectal junction. Mild presacral soft tissue density is again identified. Vascular/Lymphatic: There are no enlarged mesenteric, retroperitoneal, or pelvic lymph nodes. An infrarenal IVC filter is seen at approximately the L3 level. There is scattered aortic and bi-iliac calcifications noted. Reproductive: The uterus and adnexa are unremarkable. Other: No evidence of abdominal wall mass or hernia. Musculoskeletal: No acute or significant osseous findings. IMPRESSION: No central, segmental, or subsegmental pulmonary embolism. Multifocal patchy airspace opacities throughout both lungs, likely consistent with multifocal pneumonia. Hepatic steatosis Aortic Atherosclerosis (ICD10-I70.0). Electronically Signed   By: Prudencio Pair M.D.   On: 03/22/2020 00:07   MR THORACIC SPINE W WO CONTRAST  Result Date: 03/22/2020 CLINICAL DATA:  Bilateral lower extremity pain and numbness with urinary retention. Diabetes. History of colon cancer. EXAM: MRI THORACIC WITHOUT AND WITH CONTRAST TECHNIQUE: Multiplanar and multiecho pulse sequences of the thoracic spine were obtained without and with intravenous contrast. CONTRAST:  9.90mL GADAVIST GADOBUTROL 1 MMOL/ML IV SOLN COMPARISON:  CT chest done yesterday. FINDINGS: MRI THORACIC SPINE FINDINGS Alignment:  Normal Vertebrae: Normal. No fracture or primary bone lesion. No metastatic disease. Insignificant small hemangioma within the T11 vertebral  body. Cord: No primary cord pathology. See below regarding stenosis at T7-8. Paraspinal and other soft tissues: Negative Disc levels: No abnormality at T5-6 or above. T6-7: Small right paracentral disc protrusion indents the thecal sac slightly but does not cause apparent neural compression. T7-8: Left posterolateral disc herniation effaces the ventral subarachnoid space and indents the left side of the cord. This could be symptomatic. T8-9 through T12-L1: Normal. 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 Ample subarachnoid space is present dorsal to the cord at this level. None the less, this could possibly be symptomatic. Small right paracentral disc herniation at T6-7 without apparent neural compression. Electronically Signed   By: Nelson Chimes M.D.   On: 03/22/2020 12:32   MR Lumbar Spine W Wo Contrast  Result Date: 03/22/2020 CLINICAL DATA:  Back pain.  Urinary retention.  Diabetes. EXAM: MRI LUMBAR SPINE WITHOUT AND WITH CONTRAST TECHNIQUE: Multiplanar and multiecho pulse sequences of the lumbar spine were obtained without and with intravenous contrast. CONTRAST:  9.1mL GADAVIST GADOBUTROL 1 MMOL/ML IV SOLN COMPARISON:  None. FINDINGS: Segmentation:  5 lumbar type vertebral bodies. Alignment:  Slightly exaggerated lower lumbar lordosis. Vertebrae:  No fracture or primary bone lesion. Conus medullaris and cauda equina: Conus extends to the L1 level. Conus and cauda equina appear normal. Paraspinal and other soft tissues: There appears be thrombosis of the IVC and both iliac veins up to the level of the IVC filter. Flow is restored in the IVC by inflow from the renal veins. Disc levels: No abnormality from L1-2 through L2-3. L3-4: No disc abnormality.  Mild facet hypertrophy.  No stenosis. L4-5: No disc abnormality. Mild to moderate facet hypertrophy. No stenosis. L5-S1:  Minimal disc bulge.   Mild facet hypertrophy.  No stenosis. IMPRESSION: No significant disc pathology. No stenosis or neural compression in the lumbar region. Patient has exaggerated lumbar lordosis. 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 Flow is restored in the IVC above that by inflow from the renal veins. I am in the process of calling this report. Electronically Signed   By: Nelson Chimes M.D.   On: 03/22/2020 12:46   CT ABDOMEN PELVIS W CONTRAST  Result Date: 03/22/2020 CLINICAL DATA:  Severe back pain hypotension and shallow EXAM: CT ANGIOGRAPHY CHEST WITH CONTRAST TECHNIQUE: Multidetector CT imaging of the chest was performed using the standard protocol during bolus administration of intravenous contrast. Multiplanar CT image reconstructions and MIPs were obtained to evaluate the vascular anatomy. CONTRAST:  77mL OMNIPAQUE IOHEXOL 350 MG/ML SOLN COMPARISON:  None. FINDINGS: Cardiovascular: There is a optimal opacification of the pulmonary arteries. There is no central,segmental, or subsegmental filling defects within the pulmonary arteries. The heart is normal in size. No pericardial effusion or thickening. No evidence right heart strain. There is normal three-vessel brachiocephalic anatomy without proximal stenosis. The thoracic aorta is normal in appearance. Mediastinum/Nodes: No hilar, mediastinal, or axillary adenopathy. Thyroid gland, trachea, and esophagus demonstrate no significant findings. Lungs/Pleura: Multifocal patchy ground-glass opacities are seen predominantly within the periphery of both lungs. No pleural effusion or pneumothorax is seen. Upper Abdomen: No acute abnormalities present in the visualized portions of the upper abdomen. Musculoskeletal: No chest wall abnormality. No acute or significant osseous findings. Review  of the MIP images confirms the above findings. Abdomen/pelvis: Hepatobiliary: There is diffuse low density seen throughout the liver parenchyma. The main portal vein is patent. No evidence of calcified gallstones, gallbladder wall thickening or biliary dilatation. Pancreas: Unremarkable. No pancreatic ductal dilatation or surrounding inflammatory changes. Spleen: Normal in size without focal abnormality. Adrenals/Urinary Tract: Both adrenal glands appear normal. The kidneys and collecting system appear normal without evidence of urinary tract calculus or hydronephrosis. Bladder is unremarkable. Stomach/Bowel: The stomach, small bowel, and colon are normal in appearance. No inflammatory changes, wall thickening, or obstructive findings. There appears to be a surgical anastomosis at the sigmoid rectal junction. Mild presacral soft tissue density is again identified. Vascular/Lymphatic: There are no enlarged mesenteric, retroperitoneal, or pelvic lymph nodes. An infrarenal IVC filter is seen at approximately the L3 level. There is scattered aortic and bi-iliac calcifications noted. Reproductive: The uterus and adnexa are unremarkable. Other: No evidence of abdominal wall mass or hernia. Musculoskeletal: No acute or significant osseous findings. IMPRESSION: No central, segmental, or subsegmental pulmonary embolism. Multifocal patchy airspace opacities throughout both lungs, likely consistent with multifocal pneumonia. Hepatic steatosis Aortic Atherosclerosis (ICD10-I70.0). Electronically Signed   By: Prudencio Pair M.D.   On: 03/22/2020 00:07   DG Chest Port 1 View  Result Date: 03/21/2020 CLINICAL DATA:  54 year old female with weakness and hypotension. EXAM: PORTABLE CHEST 1 VIEW COMPARISON:  Chest radiograph dated 03/25/2016 FINDINGS: Faint bilateral peripheral and subpleural densities, right greater left concerning for pneumonia, possibly atypical or viral in etiology including COVID-19. Clinical correlation is  recommended. No focal consolidation, pleural effusion, pneumothorax. The cardiac silhouette is within limits. No acute osseous pathology. IMPRESSION: Findings concerning for pneumonia. Electronically Signed   By: Anner Crete M.D.   On: 03/21/2020 22:17  VAS [place] LOWER EXTREMITY VENOUS (DVT)  Result Date: 03/22/2020  Lower Venous DVTStudy Indications: Hx DVT and IVC filter, now with COVID and elevated D dimer.  Comparison Study: 06/07/15 negative Performing Technologist: June Leap RDMS, RVT  Examination Guidelines: A complete evaluation includes B-mode imaging, spectral Doppler, color Doppler, and power Doppler as needed of all accessible portions of each vessel. Bilateral testing is considered an integral part of a complete examination. Limited examinations for reoccurring indications may be performed as noted. The reflux portion of the exam is performed with the patient in reverse Trendelenburg.  +---------+---------------+---------+-----------+----------+-----------------+ RIGHT    CompressibilityPhasicitySpontaneityPropertiesThrombus Aging    +---------+---------------+---------+-----------+----------+-----------------+ CFV      None           No       No                   Acute             +---------+---------------+---------+-----------+----------+-----------------+ SFJ      None                                         Acute             +---------+---------------+---------+-----------+----------+-----------------+ FV Prox  None                                         Acute             +---------+---------------+---------+-----------+----------+-----------------+ FV Mid   None                                         Acute             +---------+---------------+---------+-----------+----------+-----------------+ FV DistalNone                                         Acute             +---------+---------------+---------+-----------+----------+-----------------+  PFV      None                                         Acute             +---------+---------------+---------+-----------+----------+-----------------+ POP      None           No       No                   Acute             +---------+---------------+---------+-----------+----------+-----------------+ PTV      None                                         Acute             +---------+---------------+---------+-----------+----------+-----------------+ PERO     None  Acute             +---------+---------------+---------+-----------+----------+-----------------+ Gastroc  None                                         Acute             +---------+---------------+---------+-----------+----------+-----------------+ GSV      None                                         Acute             +---------+---------------+---------+-----------+----------+-----------------+ Ex Iliac                No       No                   Age Indeterminate +---------+---------------+---------+-----------+----------+-----------------+ Com Iliac                                             Not visualized    +---------+---------------+---------+-----------+----------+-----------------+   +---------+---------------+---------+-----------+----------+-----------------+ LEFT     CompressibilityPhasicitySpontaneityPropertiesThrombus Aging    +---------+---------------+---------+-----------+----------+-----------------+ CFV      None           No       No                   Acute             +---------+---------------+---------+-----------+----------+-----------------+ SFJ      None                                         Acute             +---------+---------------+---------+-----------+----------+-----------------+ FV Prox  None                                         Acute              +---------+---------------+---------+-----------+----------+-----------------+ FV Mid   None                                         Acute             +---------+---------------+---------+-----------+----------+-----------------+ FV DistalNone                                         Acute             +---------+---------------+---------+-----------+----------+-----------------+ PFV      None                                         Acute             +---------+---------------+---------+-----------+----------+-----------------+ POP  None           No       No                   Acute             +---------+---------------+---------+-----------+----------+-----------------+ PTV      None                                         Acute             +---------+---------------+---------+-----------+----------+-----------------+ PERO     None                                         Acute             +---------+---------------+---------+-----------+----------+-----------------+ Gastroc  None                                         Acute             +---------+---------------+---------+-----------+----------+-----------------+ GSV      None                                         Acute             +---------+---------------+---------+-----------+----------+-----------------+ Ex Iliac                No       No                   Age Indeterminate +---------+---------------+---------+-----------+----------+-----------------+ Com Iliac                                             Not visualized    +---------+---------------+---------+-----------+----------+-----------------+     Summary: RIGHT: - Findings consistent with acute deep vein thrombosis involving the right common femoral vein, SF junction, right femoral vein, right proximal profunda vein, right popliteal vein, right posterior tibial veins, right peroneal veins, and right gastrocnemius veins. -  Findings consistent with acute superficial vein thrombosis involving the right great saphenous vein. - Thrombosis extends into the Right Iliac vein and IVC up to the level of the filter placement.  LEFT: - Findings consistent with acute deep vein thrombosis involving the left common femoral vein, SF junction, left femoral vein, left proximal profunda vein, left popliteal vein, left posterior tibial veins, left peroneal veins, and left gastrocnemius veins. - Findings consistent with acute superficial vein thrombosis involving the left great saphenous vein. - Thrombosis extends into the Left Iliac vein.  *See table(s) above for measurements and observations. Electronically signed by Curt Jews MD on 03/22/2020 at 5:19:17 PM.    Final    HYBRID OR IMAGING ([place])  Result Date: 03/29/2020 There is no interpretation for this exam.  This order is for images obtained during a surgical procedure.  Please See "Surgeries" Tab for more information regarding the procedure.   HYBRID OR IMAGING ([place])  Result Date: 03/28/2020  There is no interpretation for this exam.  This order is for images obtained during a surgical procedure.  Please See "Surgeries" Tab for more information regarding the procedure.

## 2020-04-01 NOTE — Progress Notes (Signed)
Mobility Specialist - Progress Note   04/01/20 1518  Mobility  Activity Dangled on edge of bed  Level of Assistance Independent  Assistive Device None  Mobility Response Tolerated fair  Mobility performed by Mobility specialist  $Mobility charge 1 Mobility    Pre-mobility: 72 HR, 144/78 BP, 96% SpO2 Post-mobility: 79 HR, 152/87 BP, 95% SPO2  Pt c/o nausea only with movement, did not progress ambulation any further due to this.   [place] Specialist

## 2020-04-01 NOTE — Evaluation (Signed)
Physical Therapy Evaluation Patient Details Name: [name] MRN: [id number] DOB: [birth year] Today's Date: 04/01/2020   History of Present Illness  Patient is a 54 y.o. female with PMHx of DM-2, HLD, hypothyroidism, PE-s/p IVC filter in 2012, history of colon cancer-diagnosed with COVID-19 on 5/21-presented with fatigue/malaise, low back pain-found to have patchy airspace disease on CTA chest, MRI thoracic/lumbar spine showed T7 disc herniation-but also showed extensive clot in the IVC.  s/p bilateral popliteal lytic catheter placement on 6/8-and mechanical thrombectomy/IVC filter on 6/9  Clinical Impression  Pt admitted with above complications, s/p surgery. Pt able to stand with min assist +2 for approx 1 minute. Limited by bowel urgency this date but suspect she will do well transferring to chair with assistance and rolling walker. Pt currently with functional limitations due to the deficits listed below (see PT Problem List). Pt will benefit from skilled PT to increase their independence and safety with mobility to allow discharge to the venue listed below.       Follow Up Recommendations CIR    Equipment Recommendations  Other (comment) (TBD)    Recommendations for Other Services Rehab consult     Precautions / Restrictions Precautions Precautions: Fall Restrictions Weight Bearing Restrictions: No      Mobility  Bed Mobility Overal bed mobility: Modified Independent             General bed mobility comments: Extra time however good power-up to EOB. Slower return to bed, mostly due to nausea. Able to bridge in bed for bed pan placement.  Transfers Overall transfer level: Needs assistance Equipment used: Rolling walker (2 wheeled) Transfers: Sit to/from Stand Sit to Stand: Min assist;+2 physical assistance         General transfer comment: Min assist for boost to stand +2 for safety and balance. No buckling noted. Short duration tolerated due to nausea and bowel  urgency. Cues for walker placement, effortful rise but good control with light RW support once standing.  Ambulation/Gait                Stairs            Wheelchair Mobility    Modified Rankin (Stroke Patients Only)       Balance Overall balance assessment: Needs assistance Sitting-balance support: No upper extremity supported;Feet supported Sitting balance-Leahy Scale: Fair     Standing balance support: Bilateral upper extremity supported Standing balance-Leahy Scale: Poor Standing balance comment: Pt a bit anxious standing, nauseous, likely able to remove hands but not this date.                             Pertinent Vitals/Pain Pain Assessment: No/denies pain    Home Living Family/patient expects to be discharged to:: Private residence Living Arrangements: Spouse/significant other Available Help at Discharge: Family (Husband works and cares for his father. 62 yo son home) Type of Home: House Home Access: Stairs to enter Entrance Stairs-Rails: Can reach both Entrance Stairs-Number of Steps: 3 Home Layout: Two level;Able to live on main level with bedroom/bathroom Home Equipment: None      Prior Function Level of Independence: Independent         Comments: worked from home for [employer]        Extremity/Trunk Assessment   Upper Extremity Assessment Upper Extremity Assessment: Defer to OT evaluation    Lower Extremity Assessment Lower Extremity Assessment: Generalized weakness (  LEs bandaged)       Communication   Communication: No difficulties  Cognition Arousal/Alertness: Awake/alert Behavior During Therapy: WFL for tasks assessed/performed Overall Cognitive Status: Within Functional Limits for tasks assessed                                        General Comments General comments (skin integrity, edema, etc.): VSS during assessment, SpO2 96% on room air. Mild dyspnea at  rest and with mobility.     Exercises General Exercises - Lower Extremity Ankle Circles/Pumps: AROM;Both;10 reps;Supine Quad Sets: Strengthening;Both;10 reps;Supine Gluteal Sets: Strengthening;Both;10 reps;Supine Heel Slides: Strengthening;Both;10 reps;Supine   Assessment/Plan    PT Assessment Patient needs continued PT services  PT Problem List Decreased strength;Decreased activity tolerance;Decreased balance;Decreased mobility;Decreased knowledge of use of DME       PT Treatment Interventions DME instruction;Gait training;Functional mobility training;Therapeutic activities;Balance training;Therapeutic exercise;Stair training;Patient/family education    PT Goals (Current goals can be found in the Care Plan section)  Acute Rehab PT Goals Patient Stated Goal: Get well and go home as soon as possible PT Goal Formulation: With patient Time For Goal Achievement: 04/15/20 Potential to Achieve Goals: Good    Frequency Min 4X/week   Barriers to discharge Decreased caregiver support Husband works but cares for his father frequently.     Co-evaluation PT/OT/SLP Co-Evaluation/Treatment: Yes Reason for Co-Treatment: Complexity of the patient's impairments (multi-system involvement);To address functional/ADL transfers;For patient/therapist safety PT goals addressed during session: Mobility/safety with mobility;Balance;Proper use of DME;Strengthening/ROM         AM-PAC PT "6 Clicks" Mobility  Outcome Measure Help needed turning from your back to your side while in a flat bed without using bedrails?: None Help needed moving from lying on your back to sitting on the side of a flat bed without using bedrails?: None Help needed moving to and from a bed to a chair (including a wheelchair)?: A Lot Help needed standing up from a chair using your arms (e.g., wheelchair or bedside chair)?: A Lot Help needed to walk in hospital room?: A Lot Help needed climbing 3-5 steps with a railing? :  Total 6 Click Score: 15    End of Session Equipment Utilized During Treatment: Gait belt Activity Tolerance: Patient tolerated treatment well Patient left: in chair;with call bell/phone within reach;Other (comment) (on bed pan, pt verbalizes understanding to call when ready.) Nurse Communication: Mobility status;Other (comment) (on bed pan) PT Visit Diagnosis: Muscle weakness (generalized) (M62.81);Difficulty in walking, not elsewhere classified (R26.2)    Time: 1914-7829 PT Time Calculation (min) (ACUTE ONLY): 21 min   Charges:   PT Evaluation $PT Eval Moderate Complexity: 1 [address] [place], PT   Ellouise Newer 04/01/2020, 4:01 PM

## 2020-04-01 NOTE — Progress Notes (Signed)
Inpatient Rehab Admissions Coordinator Note:   Per therapy recommendations, pt was screened for CIR candidacy by Heliodoro Domagalski, MS CCC-SLP. At this time, Pt. Appears to have functional decline and is a good candidate for CIR. Will place order for rehab consult per protocol.  Please contact me with questions.   Atha Mcbain, MS, CCC-SLP Rehab Admissions Coordinator  [phone] (celll) [phone] (office)  

## 2020-04-01 NOTE — Progress Notes (Signed)
ANTICOAGULATION CONSULT NOTE - Follow Up Consult  Pharmacy Consult for heparin>>argatroban Indication: clotted IVC and DVTs  Labs: Recent Labs    03/30/20 0241 03/30/20 0730 03/30/20 0922 03/30/20 0922 03/30/20 1231 03/31/20 0437 04/01/20 0354  HGB 10.2*  --  8.8*   < >  --  8.7* 9.0*  HCT 28.8*  --  25.6*  --   --  25.5* 27.2*  PLT 69*  --  66*  --   --  68* 74*  APTT  --    < >  --   --  83* 79* 75*  HEPARINUNFRC 0.20*  --   --   --   --   --   --   CREATININE 0.87  --  0.70  --   --  0.60  --    < > = values in this interval not displayed.    Assessment: 54yo female subtherapeutic on heparin after resumed s/p mechanical thrombectomy; patient now has been transitioned to argatroban d/t thrombocytopenia. No gtt issues or signs of bleeding per RN.  Aptt continue to be at goal on 0.36mcg/kg/min after rate adjustment yesterday. Hgb stable at 8.7, plt stable at 68.  HIT antibody negative  Goal of Therapy:  Aptt goal 50-90s   Plan:  Continue argatroban at 0.37mcg/kg/min Aptt daily F/u plans for oral anticoagulation eventually.  [place] Crane, Roylene Reason, BCCP Clinical Pharmacist  04/01/2020 8:40 AM   [facility] pharmacy phone numbers are listed on [email or web address]

## 2020-04-01 NOTE — Progress Notes (Addendum)
[facility] [place]  Subjective  - Feels taht legs are less swollen.   Objective 137/80 77 98.4 F (36.9 C) (Oral) 18 97%  Intake/Output Summary (Last 24 hours) at 04/01/2020 0830 Last data filed at 04/01/2020 0600 Gross per 24 hour  Intake 390.61 ml  Output 800 ml  Net -409.39 ml    B LE edema right > left, soft with  Removed right groin stitch placed dry dressing Feet warm and well perfused Abdomin soft, pain improving  Assessment/Planning: POD # 3  Procedure:   1.Ultrasound-guided cannulation of right internal jugular vein 2.Ultrasound-guided cannulation of left internal jugular vein 3.Ultrasound-guided placement of 7 French double-lumen central venous line and left internal jugular vein 4.Intravascular ultrasound of bilateral femoral, bilateral common femoral, bilateral external iliac and bilateral common iliac veins and IVC 5.IVC venogram 6.Angio VAC and cleaner wire mechanical thrombectomy of IVC, bilateral common iliac, bilateral external iliac, bilateral common femoral and bilateral femoral veins 7.Stent of IVC with 18 x 90 mm Wallstent 8.Ultrasound-guided cannulation right common femoral vein 9.Veno venous bypass for 58 minutes  Argatroban secondary to HIT panel pending.  Will continue Argatroban for now secondary to low platelets. Will plan DOAC for discharge Likely RP bleed from IVC after filter removed. Abdominal pain improving, tolerating PO's Encouraged ambulation and will fit with thigh compression today and d/c ace wraps.  Roxy Horseman 04/01/2020 8:30 AM --  Laboratory Lab Results: Recent Labs    03/31/20 0437 04/01/20 0354  WBC 8.9 8.6  HGB 8.7* 9.0*  HCT 25.5* 27.2*  PLT 68* 74*   BMET Recent Labs    03/30/20 0922 03/31/20 0437  NA 136 136  K 4.0 3.9  CL 107 108  CO2 22 23  GLUCOSE 173* 135*  BUN 15 9  CREATININE 0.70 0.60  CALCIUM 7.6* 7.9*    COAG Lab Results  Component  Value Date   INR 1.5 (H) 03/21/2020   INR 1.05 12/21/2012   INR 2.10 12/07/2012   PROTIME 25.2 (H) 12/07/2012   PROTIME 30.0 (H) 11/11/2012   PROTIME 16.8 (H) 11/02/2012   No results found for: PTT   I have seen and evaluated the patient. I agree with the PA note as documented above.  Status post angio vac and mechanical thrombectomy of IVC and bilateral iliac veins with removal of IVC filter and stent of the IVC with a Wallstent.  Hemoglobin is stable today from 8.7 to 9.  She still has wraps to her lower extremities and will order thigh-high compression.  Discussed with pharmacy transitioning to heparin given HIT panel is negative but apparently it will be easier to stay on argatroban for ease of dose.  Will wait to transition to [place] until we know that she is going to tolerate food.  Abdominal pain seems to be improving and she is tolerating some liquids.  Will remove red rubber catheters from intervention.  Marty Heck, MD [facility] [place] Office: [phone]

## 2020-04-02 LAB — CBC
HCT: 26.2 % — ABNORMAL LOW (ref 36.0–46.0)
Hemoglobin: 8.7 g/dL — ABNORMAL LOW (ref 12.0–15.0)
MCH: 29.8 pg (ref 26.0–34.0)
MCHC: 33.2 g/dL (ref 30.0–36.0)
MCV: 89.7 fL (ref 80.0–100.0)
Platelets: 81 10*3/uL — ABNORMAL LOW (ref 150–400)
RBC: 2.92 MIL/uL — ABNORMAL LOW (ref 3.87–5.11)
RDW: 16.1 % — ABNORMAL HIGH (ref 11.5–15.5)
WBC: 6.9 10*3/uL (ref 4.0–10.5)
nRBC: 0.6 % — ABNORMAL HIGH (ref 0.0–0.2)

## 2020-04-02 LAB — GLUCOSE, CAPILLARY
Glucose-Capillary: 91 mg/dL (ref 70–99)
Glucose-Capillary: 80 mg/dL (ref 70–99)
Glucose-Capillary: 87 mg/dL (ref 70–99)
Glucose-Capillary: 81 mg/dL (ref 70–99)

## 2020-04-02 LAB — APTT: aPTT: 75 seconds — ABNORMAL HIGH (ref 24–36)

## 2020-04-02 NOTE — Progress Notes (Addendum)
[facility] [place]  Subjective  - Doing better each day.   Objective 137/85 75 98.2 F (36.8 C) (Oral) 18 98%  Intake/Output Summary (Last 24 hours) at 04/02/2020 0824 Last data filed at 04/02/2020 0610 Gross per 24 hour  Intake 120 ml  Output 4525 ml  Net -4405 ml    B LE decreased edema.  Compression in room to be put on today Feet warm and well perfused with intact motor and sensation. Groin and neck soft Abdomin soft, pain continues to improve   Assessment/Planning: POD # 4  Procedure:1.Ultrasound-guided cannulation of right internal jugular vein 2.Ultrasound-guided cannulation of left internal jugular vein 3.Ultrasound-guided placement of 7 French double-lumen central venous line and left internal jugular vein 4.Intravascular ultrasound of bilateral femoral, bilateral common femoral, bilateral external iliac and bilateral common iliac veins and IVC 5.IVC venogram 6.Angio VAC and cleaner wire mechanical thrombectomy of IVC, bilateral common iliac, bilateral external iliac, bilateral common femoral and bilateral femoral veins 7.Stent of IVC with 18 x 90 mm Wallstent 8.Ultrasound-guided cannulation right common femoral vein 9.Veno venous bypass for 58 minutes  Argatroban secondary to HIT panel pending.  Will continue Argatroban for now secondary to low platelets. Will plan DOAC for discharge HGB 8.7 asymptomatic Likely RP bleed from IVC after filter removed. Abdominal pain improving, tolerating PO's Compression will be placed on B LE today  Roxy Horseman 04/02/2020 8:24 AM --  Laboratory Lab Results: Recent Labs    04/01/20 0354 04/02/20 0422  WBC 8.6 6.9  HGB 9.0* 8.7*  HCT 27.2* 26.2*  PLT 74* 81*   BMET Recent Labs    03/30/20 0922 03/31/20 0437  NA 136 136  K 4.0 3.9  CL 107 108  CO2 22 23  GLUCOSE 173* 135*  BUN 15 9  CREATININE 0.70 0.60  CALCIUM 7.6* 7.9*    COAG Lab Results   Component Value Date   INR 1.5 (H) 03/21/2020   INR 1.05 12/21/2012   INR 2.10 12/07/2012   PROTIME 25.2 (H) 12/07/2012   PROTIME 30.0 (H) 11/11/2012   PROTIME 16.8 (H) 11/02/2012   No results found for: PTT  I have seen and evaluated the patient. I agree with the PA note as documented above.  Status post angio vac and mechanical thrombectomy of IVC and bilateral iliac veins with removal of IVC filter and stent of the IVC with a Wallstent.  Hemoglobin is stable 9 --> 8.7.  She still has wraps to her lower extremities and thigh high compression in room now - will try and get on today.  Will wait to transition to [place] tomorrow when  we know that she is going to tolerate food, abdomen slowly improving, still some fullness.    Marty Heck, MD [facility] [place] Office: [phone]

## 2020-04-02 NOTE — Progress Notes (Signed)
PROGRESS NOTE                                                                                                                                                                                                             Patient Demographics:    [name], is a 54 y.o. female, DOB - 09-Apr-1966, OJJ:[id number]  Outpatient Primary MD for the patient is Glendale Chard, MD   Admit date - 03/21/2020   LOS - 51  Chief Complaint  Patient presents with  . Hypotension  . Back Pain       Brief Narrative: Patient is a 54 y.o. female with PMHx of DM-2, HLD, hypothyroidism, PE-s/p IVC filter in 2012, history of colon cancer-diagnosed with COVID-19 on 5/21-presented with fatigue/malaise, low back pain-found to have patchy airspace disease on CTA chest, MRI thoracic/lumbar spine showed T7 disc herniation-but also showed extensive clot in the IVC.  Subsequently a Doppler showed extensive DVT in the bilateral lower extremity.  She was started on IV heparin-vascular surgery was consulted.  See below for further details.  Significant Events: 6/1>> admit to [facility] for low back pain/weakness/malaise-found to have COVID-19 pneumonia on CT imaging. 6/2>> lower extremity Doppler/MRI LS spine-significant lower extremity DVT extending up to IVC filter 6/8>> catheter guided lytic therapy for bilateral lower extremity DVT 6/9>> mechanical thrombectomy/IVC filter removal 6/10>> hypotension overnight-requiring initiation of Levophed 6/11>> off Levophed since 6/10 evening-transfer out of ICU  Significant studies: 6/1>> CTA chest: No PE-multifocal patchy airspace opacities throughout both lungs. 6/1>> CT abdomen/pelvis: Hepatic steatosis, aortic atherosclerosis 6/2>> MRI thoracic spine: T7-8 central to left-sided disc herniation 6/2>> MRI lumbar spine: No significant disc pathology/no stenosis or neurocompression.  Thrombosis of the IVC, both iliac veins to  the level of the IVC filter. 6/2>> lower extremity Doppler: Bilateral lower extremity extensive DVT.  Procedures: 6/8>>  #1: Ultrasound-guided access, bilateral popliteal vein  #2: Intravascular ultrasound, bilateral femoral, common femoral, external iliac, common iliac veins, and inferior vena cava #3: Placement of bilateral intravenous infusion catheter for thrombolytic therapy into the vena cava.                         #4: Initiation of thrombolytic therapy (TPA into the inferior vena cava, common iliac, external iliac, common femoral, and femoral veins  6/9>> 1.  Ultrasound-guided cannulation  of right internal jugular vein 2.  Ultrasound-guided cannulation of left internal jugular vein 3.  Ultrasound-guided placement of 7 French double-lumen central venous line and left internal jugular vein 4.  Intravascular ultrasound of bilateral femoral, bilateral common femoral, bilateral external iliac and bilateral common iliac veins and IVC 5.  IVC venogram 6.  Angio VAC and cleaner wire mechanical thrombectomy of IVC, bilateral common iliac, bilateral external iliac, bilateral common femoral and bilateral femoral veins 7.  Stent of IVC with 18 x 90 mm Wallstent 8.  Ultrasound-guided cannulation right common femoral vein 9.  Veno venous bypass for 58 minutes   COVID-19 medications: Steroids:6/1>>6/9 Remdesivir:6/1>>6/6  Antibiotics: Zithromax: 6/1>> 6/4 Rocephin: 6/1>> 6/4  Microbiology data: 6/1>> blood culture: Negative 6/2>> urine culture: Negative  Consults: VVS  DVT prophylaxis: IV argatoban    Subjective:   Had a small BM yesterday-feels better-seems to have much more energy today compared to the past few days.   Assessment  & Plan :   Sepsis secondary to Covid 19 Viral pneumonia: Sepsis physiology has resolved-has completed a course of steroids/remdesivir.  Remains stable on room air.  Fever: afebrile  O2 requirements:  SpO2: 98 % O2 Flow Rate (L/min): 2 L/min    COVID-19 Labs: No results for input(s): DDIMER, FERRITIN, LDH, CRP in the last 72 hours.     Component Value Date/Time   BNP 29.9 03/21/2020 2148    No results for input(s): PROCALCITON in the last 168 hours.  Lab Results  Component Value Date   SARSCOV2NAA POSITIVE (A) 03/10/2020    Prone/Incentive Spirometry: encouraged incentive spirometry use 3-4/hour.  Extensive bilateral lower extremity DVTs-involving bilateral iliac veins and extending to IVC up to the level of IVC filter: s/p bilateral popliteal lytic catheter placement on 6/8-and mechanical thrombectomy/IVC filter on 6/9-vascular surgery following.  Switched to argatroban due to thrombocytopenia-HIT antibody negative-discussed with vascular surgeon-Dr. Clark-recommends we will continue with IV argatroban for another day-if platelet count continues to improve-diet remains stable-suspect can be transition to a oral anticoagulant tomorrow.  Hypovolemic shock: Resolved-secondary to blood loss-resuscitated with IV fluids-briefly required Levophed while in the ICU.  BP now stable.  Acute blood loss anemia: Secondary to perioperative blood loss-see operative note-follow CBC-no indication for transfusion.  Thrombocytopenia: Continues to have stable thrombocytopenia-remains on argatroban-HIT antibody negative.  Platelet count slowly improving-okay to transition to oral anticoagulant (either Eliquis or Xarelto) in the next day or so.  AKI: Likely hemodynamically mediated-resolved  DM-2 (A1c 8.6) with uncontrolled hyperglycemia with mild DKA: Initially on insulin infusion-CBGs currently stable with 10 units of Lantus daily, SSI-follow and adjust.  Recent Labs    04/01/20 2139 04/02/20 0607 04/02/20 1142  GLUCAP 99 91 80    T7-T8 disc herniation: Prior MD-Dr. Waldron Labs already discussed MRI findings with Dr. Jenkins-recommendations were for outpatient follow-up.  Hypothyroidism: Continue Synthroid.  TSH mildly elevated-not sure  when her Synthroid dosing was adjusted last-stable for outpatient follow-up/adjustment with PCP.  HLD: Continue statin  Constipation: Small BM on 6/12-continue bowel regimen with MiraLAX/senna  Deconditioning/debility: Secondary to acute illness-numerous procedures-evaluated by rehab services-recommendations are for CIR.  Nutrition Problem: Nutrition Problem: Increased nutrient needs Etiology: acute illness (COVID) Signs/Symptoms: estimated needs Interventions: Glucerna shake  ABG:    Component Value Date/Time   PHART 7.345 (L) 03/29/2020 1425   PCO2ART 42.2 03/29/2020 1425   PO2ART 200 (H) 03/29/2020 1425   HCO3 23.0 03/29/2020 1425   TCO2 24 03/29/2020 1425   ACIDBASEDEF 2.0 03/29/2020 1425   O2SAT 100.0 03/29/2020  [place]: N/A   Condition -Stable  Family Communication  : Spouse over the phone on 6/12-we will update on 6/14.  Code Status :  Full Code  Diet :  Diet Order            Diet Carb Modified Fluid consistency: Thin; Room service appropriate? Yes  Diet effective now                  Disposition Plan  : Status is: Inpatient  Remains inpatient appropriate because:Inpatient level of care appropriate due to severity of illness  Dispo: The patient is from: Home              Anticipated d/c is to: Home              Anticipated d/c date is: 2 days              Patient currently is not medically stable to d/c.  Barriers to discharge: Extensive bilateral DVT-s/p catheter guided lytic mechanical thrombectomy-on IV argatroban  Antimicorbials  :    Anti-infectives (From admission, onward)   Start     Dose/Rate Route Frequency Ordered Stop   03/23/20 2200  azithromycin (ZITHROMAX) tablet 500 mg  Status:  Discontinued        500 mg Oral Daily at bedtime 03/23/20 0806 03/25/20 1233   03/23/20 1000  remdesivir 100 mg in sodium chloride 0.9 % 100 mL IVPB       "Followed by" Linked Group Details   100 mg 200 mL/hr over 30 Minutes Intravenous Daily  03/22/20 0110 03/26/20 1913   03/22/20 2200  azithromycin (ZITHROMAX) 500 mg in sodium chloride 0.9 % 250 mL IVPB  Status:  Discontinued        500 mg 250 mL/hr over 60 Minutes Intravenous Every 24 hours 03/22/20 0111 03/23/20 0806   03/22/20 2200  cefTRIAXone (ROCEPHIN) 1 g in sodium chloride 0.9 % 100 mL IVPB  Status:  Discontinued        1 g 200 mL/hr over 30 Minutes Intravenous Every 24 hours 03/22/20 0111 03/25/20 1233   03/22/20 0200  remdesivir 200 mg in sodium chloride 0.9% 250 mL IVPB       "Followed by" Linked Group Details   200 mg 580 mL/hr over 30 Minutes Intravenous Once 03/22/20 0110 03/22/20 1900   03/22/20 0015  cefTRIAXone (ROCEPHIN) 1 g in sodium chloride 0.9 % 100 mL IVPB        1 g 200 mL/hr over 30 Minutes Intravenous  Once 03/22/20 0010 03/22/20 0103   03/21/20 2300  cefTRIAXone (ROCEPHIN) injection 1 g  Status:  Discontinued        1 g Intramuscular  Once 03/21/20 2253 03/22/20 0010   03/21/20 2300  azithromycin (ZITHROMAX) 500 mg in sodium chloride 0.9 % 250 mL IVPB        500 mg 250 mL/hr over 60 Minutes Intravenous  Once 03/21/20 2253 03/22/20 0129      Inpatient Medications  Scheduled Meds: . vitamin C  500 mg Oral Daily  . Chlorhexidine Gluconate Cloth  6 each Topical Daily  . famotidine  20 mg Oral BID  . feeding supplement (GLUCERNA SHAKE)  237 mL Oral TID BM  . insulin aspart  0-15 Units Subcutaneous TID WC  . insulin aspart  0-5 Units Subcutaneous QHS  . insulin glargine  10 Units Subcutaneous Daily  . levothyroxine  100 mcg Oral Q0600  . linagliptin  5  mg Oral Daily  . multivitamin with minerals  1 tablet Oral Daily  . senna-docusate  2 tablet Oral QHS  . simvastatin  10 mg Oral Daily  . sodium chloride flush  10-40 mL Intracatheter Q12H  . sodium chloride flush  3 mL Intravenous Q12H  . zinc sulfate  220 mg Oral Daily   Continuous Infusions: . sodium chloride Stopped (03/31/20 0846)  . argatroban 0.5 mcg/kg/min (04/02/20 0232)  . sodium  chloride     PRN Meds:.sodium chloride, acetaminophen, albuterol, bisacodyl, guaiFENesin-dextromethorphan, HYDROmorphone (DILAUDID) injection, loperamide, ondansetron **OR** ondansetron (ZOFRAN) IV, oxyCODONE, sodium chloride, sodium chloride flush, traMADol, zolpidem   Time Spent in minutes  25  See all Orders from today for further details   Oren Binet M.D on 04/02/2020 at 1:41 PM  To page go to [email or web address] - use universal password  [facility] -  Office  [phone]    Objective:   Vitals:   04/02/20 0036 04/02/20 0425 04/02/20 0739 04/02/20 1138  BP: 140/89 137/87 137/85 (!) 149/86  Pulse: 70 73 75 82  Resp: 17 12 18 15   Temp: 98.4 F (36.9 C) 97.8 F (36.6 C) 98.2 F (36.8 C) 97.9 F (36.6 C)  TempSrc: Oral Oral Oral Oral  SpO2: 96% 98% 98% 98%  Weight:  111.6 kg    Height:        Wt Readings from Last 3 Encounters:  04/02/20 111.6 kg  02/15/20 91.2 kg  08/02/19 95.3 kg     Intake/Output Summary (Last 24 hours) at 04/02/2020 1341 Last data filed at 04/02/2020 0930 Gross per 24 hour  Intake 120 ml  Output 4525 ml  Net -4405 ml     Physical Exam Gen Exam:Alert awake-not in any distress HEENT:atraumatic, normocephalic Chest: B/L clear to auscultation anteriorly CVS:S1S2 regular Abdomen:soft non tender, non distended Extremities:no edema Neurology: Non focal Skin: no rash   Data Review:    CBC Recent Labs  Lab 03/29/20 1614 03/29/20 1614 03/30/20 0241 03/30/20 0922 03/31/20 0437 04/01/20 0354 04/02/20 0422  WBC 14.6*   < > 12.6* 10.2 8.9 8.6 6.9  HGB 12.4   < > 10.2* 8.8* 8.7* 9.0* 8.7*  HCT 36.7   < > 28.8* 25.6* 25.5* 27.2* 26.2*  PLT PLATELET CLUMPS NOTED ON SMEAR, UNABLE TO ESTIMATE   < > 69* 66* 68* 74* 81*  MCV 86.6   < > 83.5 85.0 87.6 90.1 89.7  MCH 29.2   < > 29.6 29.2 29.9 29.8 29.8  MCHC 33.8   < > 35.4 34.4 34.1 33.1 33.2  RDW 15.3   < > 16.5* 17.0* 17.2* 16.5* 16.1*  LYMPHSABS 1.4  --   --  1.1  --   --   --     MONOABS 1.0  --   --  0.6  --   --   --   EOSABS 0.0  --   --  0.0  --   --   --   BASOSABS 0.0  --   --  0.0  --   --   --    < > = values in this interval not displayed.    Chemistries  Recent Labs  Lab 03/27/20 1136 03/27/20 1136 03/28/20 0517 03/28/20 0517 03/29/20 0615 03/29/20 0949 03/29/20 1425 03/29/20 1614 03/30/20 0241 03/30/20 0922 03/31/20 0437  NA 135   < > 137   < > 137   < > 138 137 135 136 136  K 4.1   < >  4.4   < > 5.2*   < > 3.8 4.1 4.7 4.0 3.9  CL 102   < > 103   < > 105  --   --  108 106 107 108  CO2 26   < > 25   < > 26  --   --  19* 21* 22 23  GLUCOSE 214*   < > 164*   < > 182*  --   --  298* 211* 173* 135*  BUN 26*   < > 27*   < > 22*  --   --  18 21* 15 9  CREATININE 0.92   < > 0.89   < > 0.87  --   --  0.63 0.87 0.70 0.60  CALCIUM 9.1   < > 9.1   < > 8.8*  --   --  7.7* 7.7* 7.6* 7.9*  AST 20  --  18  --  31  --   --   --  16  --  15  ALT 30  --  28  --  35  --   --   --  22  --  22  ALKPHOS 73  --  68  --  82  --   --   --  40  --  47  BILITOT 0.7  --  0.7  --  1.6*  --   --   --  1.1  --  1.1   < > = values in this interval not displayed.   ------------------------------------------------------------------------------------------------------------------ No results for input(s): CHOL, HDL, LDLCALC, TRIG, CHOLHDL, LDLDIRECT in the last 72 hours.  Lab Results  Component Value Date   HGBA1C 8.6 (H) 03/23/2020   ------------------------------------------------------------------------------------------------------------------ No results for input(s): TSH, T4TOTAL, T3FREE, THYROIDAB in the last 72 hours.  Invalid input(s): FREET3 ------------------------------------------------------------------------------------------------------------------ No results for input(s): VITAMINB12, FOLATE, FERRITIN, TIBC, IRON, RETICCTPCT in the last 72 hours.  Coagulation profile No results for input(s): INR, PROTIME in the last 168 hours.  No results for  input(s): DDIMER in the last 72 hours.  Cardiac Enzymes No results for input(s): CKMB, TROPONINI, MYOGLOBIN in the last 168 hours.  Invalid input(s): CK ------------------------------------------------------------------------------------------------------------------    Component Value Date/Time   BNP 29.9 03/21/2020 2148    Micro Results No results found for this or any previous visit (from the past 240 hour(s)).  Radiology Reports CT Angio Chest PE W and/or Wo Contrast  Result Date: 03/22/2020 CLINICAL DATA:  Severe back pain hypotension and shallow EXAM: CT ANGIOGRAPHY CHEST WITH CONTRAST TECHNIQUE: Multidetector CT imaging of the chest was performed using the standard protocol during bolus administration of intravenous contrast. Multiplanar CT image reconstructions and MIPs were obtained to evaluate the vascular anatomy. CONTRAST:  75mL OMNIPAQUE IOHEXOL 350 MG/ML SOLN COMPARISON:  None. FINDINGS: Cardiovascular: There is a optimal opacification of the pulmonary arteries. There is no central,segmental, or subsegmental filling defects within the pulmonary arteries. The heart is normal in size. No pericardial effusion or thickening. No evidence right heart strain. There is normal three-vessel brachiocephalic anatomy without proximal stenosis. The thoracic aorta is normal in appearance. Mediastinum/Nodes: No hilar, mediastinal, or axillary adenopathy. Thyroid gland, trachea, and esophagus demonstrate no significant findings. Lungs/Pleura: Multifocal patchy ground-glass opacities are seen predominantly within the periphery of both lungs. No pleural effusion or pneumothorax is seen. Upper Abdomen: No acute abnormalities present in the visualized portions of the upper abdomen. Musculoskeletal: No chest wall abnormality. No acute or significant osseous findings. Review of  the MIP images confirms the above findings. Abdomen/pelvis: Hepatobiliary: There is diffuse low density seen throughout the liver  parenchyma. The main portal vein is patent. No evidence of calcified gallstones, gallbladder wall thickening or biliary dilatation. Pancreas: Unremarkable. No pancreatic ductal dilatation or surrounding inflammatory changes. Spleen: Normal in size without focal abnormality. Adrenals/Urinary Tract: Both adrenal glands appear normal. The kidneys and collecting system appear normal without evidence of urinary tract calculus or hydronephrosis. Bladder is unremarkable. Stomach/Bowel: The stomach, small bowel, and colon are normal in appearance. No inflammatory changes, wall thickening, or obstructive findings. There appears to be a surgical anastomosis at the sigmoid rectal junction. Mild presacral soft tissue density is again identified. Vascular/Lymphatic: There are no enlarged mesenteric, retroperitoneal, or pelvic lymph nodes. An infrarenal IVC filter is seen at approximately the L3 level. There is scattered aortic and bi-iliac calcifications noted. Reproductive: The uterus and adnexa are unremarkable. Other: No evidence of abdominal wall mass or hernia. Musculoskeletal: No acute or significant osseous findings. IMPRESSION: No central, segmental, or subsegmental pulmonary embolism. Multifocal patchy airspace opacities throughout both lungs, likely consistent with multifocal pneumonia. Hepatic steatosis Aortic Atherosclerosis (ICD10-I70.0). Electronically Signed   By: Prudencio Pair M.D.   On: 03/22/2020 00:07   MR THORACIC SPINE W WO CONTRAST  Result Date: 03/22/2020 CLINICAL DATA:  Bilateral lower extremity pain and numbness with urinary retention. Diabetes. History of colon cancer. EXAM: MRI THORACIC WITHOUT AND WITH CONTRAST TECHNIQUE: Multiplanar and multiecho pulse sequences of the thoracic spine were obtained without and with intravenous contrast. CONTRAST:  9.81mL GADAVIST GADOBUTROL 1 MMOL/ML IV SOLN COMPARISON:  CT chest done yesterday. FINDINGS: MRI THORACIC SPINE FINDINGS Alignment:  Normal Vertebrae:  Normal. No fracture or primary bone lesion. No metastatic disease. Insignificant small hemangioma within the T11 vertebral body. Cord: No primary cord pathology. See below regarding stenosis at T7-8. Paraspinal and other soft tissues: Negative Disc levels: No abnormality at T5-6 or above. T6-7: Small right paracentral disc protrusion indents the thecal sac slightly but does not cause apparent neural compression. T7-8: Left posterolateral disc herniation effaces the ventral subarachnoid space and indents the left side of the cord. This could be symptomatic. T8-9 through T12-L1: Normal. 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 Ample subarachnoid space is present dorsal to the cord at this level. None the less, this could possibly be symptomatic. Small right paracentral disc herniation at T6-7 without apparent neural compression. Electronically Signed   By: Nelson Chimes M.D.   On: 03/22/2020 12:32   MR Lumbar Spine W Wo Contrast  Result Date: 03/22/2020 CLINICAL DATA:  Back pain.  Urinary retention.  Diabetes. EXAM: MRI LUMBAR SPINE WITHOUT AND WITH CONTRAST TECHNIQUE: Multiplanar and multiecho pulse sequences of the lumbar spine were obtained without and with intravenous contrast. CONTRAST:  9.48mL GADAVIST GADOBUTROL 1 MMOL/ML IV SOLN COMPARISON:  None. FINDINGS: Segmentation:  5 lumbar type vertebral bodies. Alignment:  Slightly exaggerated lower lumbar lordosis. Vertebrae:  No fracture or primary bone lesion. Conus medullaris and cauda equina: Conus extends to the L1 level. Conus and cauda equina appear normal. Paraspinal and other soft tissues: There appears be thrombosis of the IVC and both iliac veins up to the level of the IVC filter. Flow is restored in the IVC by inflow from the renal veins. Disc levels: No abnormality from L1-2 through L2-3. L3-4: No disc abnormality.  Mild facet  hypertrophy.  No stenosis.  L4-5: No disc abnormality. Mild to moderate facet hypertrophy. No stenosis. L5-S1: Minimal disc bulge.  Mild facet hypertrophy.  No stenosis. IMPRESSION: No significant disc pathology. No stenosis or neural compression in the lumbar region. Patient has exaggerated lumbar lordosis. 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 Flow is restored in the IVC above that by inflow from the renal veins. I am in the process of calling this report. Electronically Signed   By: Nelson Chimes M.D.   On: 03/22/2020 12:46   CT ABDOMEN PELVIS W CONTRAST  Result Date: 03/22/2020 CLINICAL DATA:  Severe back pain hypotension and shallow EXAM: CT ANGIOGRAPHY CHEST WITH CONTRAST TECHNIQUE: Multidetector CT imaging of the chest was performed using the standard protocol during bolus administration of intravenous contrast. Multiplanar CT image reconstructions and MIPs were obtained to evaluate the vascular anatomy. CONTRAST:  35mL OMNIPAQUE IOHEXOL 350 MG/ML SOLN COMPARISON:  None. FINDINGS: Cardiovascular: There is a optimal opacification of the pulmonary arteries. There is no central,segmental, or subsegmental filling defects within the pulmonary arteries. The heart is normal in size. No pericardial effusion or thickening. No evidence right heart strain. There is normal three-vessel brachiocephalic anatomy without proximal stenosis. The thoracic aorta is normal in appearance. Mediastinum/Nodes: No hilar, mediastinal, or axillary adenopathy. Thyroid gland, trachea, and esophagus demonstrate no significant findings. Lungs/Pleura: Multifocal patchy ground-glass opacities are seen predominantly within the periphery of both lungs. No pleural effusion or pneumothorax is seen. Upper Abdomen: No acute abnormalities present in the  visualized portions of the upper abdomen. Musculoskeletal: No chest wall abnormality. No acute or significant osseous findings. Review of the MIP images confirms the above findings. Abdomen/pelvis: Hepatobiliary: There is diffuse low density seen throughout the liver parenchyma. The main portal vein is patent. No evidence of calcified gallstones, gallbladder wall thickening or biliary dilatation. Pancreas: Unremarkable. No pancreatic ductal dilatation or surrounding inflammatory changes. Spleen: Normal in size without focal abnormality. Adrenals/Urinary Tract: Both adrenal glands appear normal. The kidneys and collecting system appear normal without evidence of urinary tract calculus or hydronephrosis. Bladder is unremarkable. Stomach/Bowel: The stomach, small bowel, and colon are normal in appearance. No inflammatory changes, wall thickening, or obstructive findings. There appears to be a surgical anastomosis at the sigmoid rectal junction. Mild presacral soft tissue density is again identified. Vascular/Lymphatic: There are no enlarged mesenteric, retroperitoneal, or pelvic lymph nodes. An infrarenal IVC filter is seen at approximately the L3 level. There is scattered aortic and bi-iliac calcifications noted. Reproductive: The uterus and adnexa are unremarkable. Other: No evidence of abdominal wall mass or hernia. Musculoskeletal: No acute or significant osseous findings. IMPRESSION: No central, segmental, or subsegmental pulmonary embolism. Multifocal patchy airspace opacities throughout both lungs, likely consistent with multifocal pneumonia. Hepatic steatosis Aortic Atherosclerosis (ICD10-I70.0). Electronically Signed   By: Prudencio Pair M.D.   On: 03/22/2020 00:07   DG Chest Port 1 View  Result Date: 03/21/2020 CLINICAL DATA:  54 year old female with weakness and hypotension. EXAM: PORTABLE CHEST 1 VIEW COMPARISON:  Chest radiograph dated 03/25/2016 FINDINGS: Faint bilateral peripheral and subpleural  densities, right greater left concerning for pneumonia, possibly atypical or viral in etiology including COVID-19. Clinical correlation is recommended. No focal consolidation, pleural effusion, pneumothorax. The cardiac silhouette is within limits. No acute osseous pathology. IMPRESSION: Findings concerning for pneumonia. Electronically Signed  By: Anner Crete M.D.   On: 03/21/2020 22:17   VAS [place] LOWER EXTREMITY VENOUS (DVT)  Result Date: 03/22/2020  Lower Venous DVTStudy Indications: Hx DVT and IVC filter, now with COVID and elevated D dimer.  Comparison Study: 06/07/15 negative Performing Technologist: June Leap RDMS, RVT  Examination Guidelines: A complete evaluation includes B-mode imaging, spectral Doppler, color Doppler, and power Doppler as needed of all accessible portions of each vessel. Bilateral testing is considered an integral part of a complete examination. Limited examinations for reoccurring indications may be performed as noted. The reflux portion of the exam is performed with the patient in reverse Trendelenburg.  +---------+---------------+---------+-----------+----------+-----------------+ RIGHT    CompressibilityPhasicitySpontaneityPropertiesThrombus Aging    +---------+---------------+---------+-----------+----------+-----------------+ CFV      None           No       No                   Acute             +---------+---------------+---------+-----------+----------+-----------------+ SFJ      None                                         Acute             +---------+---------------+---------+-----------+----------+-----------------+ FV Prox  None                                         Acute             +---------+---------------+---------+-----------+----------+-----------------+ FV Mid   None                                         Acute             +---------+---------------+---------+-----------+----------+-----------------+ FV DistalNone                                          Acute             +---------+---------------+---------+-----------+----------+-----------------+ PFV      None                                         Acute             +---------+---------------+---------+-----------+----------+-----------------+ POP      None           No       No                   Acute             +---------+---------------+---------+-----------+----------+-----------------+ PTV      None                                         Acute             +---------+---------------+---------+-----------+----------+-----------------+ PERO     None  Acute             +---------+---------------+---------+-----------+----------+-----------------+ Gastroc  None                                         Acute             +---------+---------------+---------+-----------+----------+-----------------+ GSV      None                                         Acute             +---------+---------------+---------+-----------+----------+-----------------+ Ex Iliac                No       No                   Age Indeterminate +---------+---------------+---------+-----------+----------+-----------------+ Com Iliac                                             Not visualized    +---------+---------------+---------+-----------+----------+-----------------+   +---------+---------------+---------+-----------+----------+-----------------+ LEFT     CompressibilityPhasicitySpontaneityPropertiesThrombus Aging    +---------+---------------+---------+-----------+----------+-----------------+ CFV      None           No       No                   Acute             +---------+---------------+---------+-----------+----------+-----------------+ SFJ      None                                         Acute              +---------+---------------+---------+-----------+----------+-----------------+ FV Prox  None                                         Acute             +---------+---------------+---------+-----------+----------+-----------------+ FV Mid   None                                         Acute             +---------+---------------+---------+-----------+----------+-----------------+ FV DistalNone                                         Acute             +---------+---------------+---------+-----------+----------+-----------------+ PFV      None                                         Acute             +---------+---------------+---------+-----------+----------+-----------------+ POP  None           No       No                   Acute             +---------+---------------+---------+-----------+----------+-----------------+ PTV      None                                         Acute             +---------+---------------+---------+-----------+----------+-----------------+ PERO     None                                         Acute             +---------+---------------+---------+-----------+----------+-----------------+ Gastroc  None                                         Acute             +---------+---------------+---------+-----------+----------+-----------------+ GSV      None                                         Acute             +---------+---------------+---------+-----------+----------+-----------------+ Ex Iliac                No       No                   Age Indeterminate +---------+---------------+---------+-----------+----------+-----------------+ Com Iliac                                             Not visualized    +---------+---------------+---------+-----------+----------+-----------------+     Summary: RIGHT: - Findings consistent with acute deep vein thrombosis involving the right common femoral vein, SF junction,  right femoral vein, right proximal profunda vein, right popliteal vein, right posterior tibial veins, right peroneal veins, and right gastrocnemius veins. - Findings consistent with acute superficial vein thrombosis involving the right great saphenous vein. - Thrombosis extends into the Right Iliac vein and IVC up to the level of the filter placement.  LEFT: - Findings consistent with acute deep vein thrombosis involving the left common femoral vein, SF junction, left femoral vein, left proximal profunda vein, left popliteal vein, left posterior tibial veins, left peroneal veins, and left gastrocnemius veins. - Findings consistent with acute superficial vein thrombosis involving the left great saphenous vein. - Thrombosis extends into the Left Iliac vein.  *See table(s) above for measurements and observations. Electronically signed by Curt Jews MD on 03/22/2020 at 5:19:17 PM.    Final    HYBRID OR IMAGING ([place])  Result Date: 03/29/2020 There is no interpretation for this exam.  This order is for images obtained during a surgical procedure.  Please See "Surgeries" Tab for more information regarding the procedure.   HYBRID OR IMAGING ([place])  Result Date: 03/28/2020  There is no interpretation for this exam.  This order is for images obtained during a surgical procedure.  Please See "Surgeries" Tab for more information regarding the procedure.

## 2020-04-02 NOTE — Progress Notes (Signed)
Mobility Specialist - Progress Note   04/02/20 1145  Mobility  Activity Refused mobility  $Mobility charge 1 Mobility     Shalan Neault Solicitor

## 2020-04-02 NOTE — Progress Notes (Signed)
ANTICOAGULATION CONSULT NOTE - Follow Up Consult  Pharmacy Consult for heparin>>argatroban Indication: clotted IVC and DVTs  Labs: Recent Labs    03/30/20 0922 03/30/20 1231 03/31/20 0437 03/31/20 0437 04/01/20 0354 04/02/20 0422  HGB 8.8*   < > 8.7*   < > 9.0* 8.7*  HCT 25.6*   < > 25.5*  --  27.2* 26.2*  PLT 66*   < > 68*  --  74* 81*  APTT  --    < > 79*  --  75* 75*  CREATININE 0.70  --  0.60  --   --   --    < > = values in this interval not displayed.    Assessment: 54yo female subtherapeutic on heparin after resumed s/p mechanical thrombectomy; patient transitioned to argatroban d/t thrombocytopenia. HIT antibody negative but continue argatroban per MD 6/12 give stability and plan to switch to DOAC soon.  Aptt continue to be at goal on 0.57mcg/kg/min after rate adjustment yesterday. Hgb, plt stable.   Goal of Therapy:  Aptt goal 50-90s Monitor platelets per protocol: yes    Plan:  Continue argatroban at 0.55mcg/kg/min Monitor daily aPTT, CBC/plt Monitor for signs/symptoms of bleeding  F/u switch to apixaban per [facility]    Benetta Spar, PharmD, BCPS, [place] Clinical Pharmacist  Please check AMION for all [place] phone numbers After 10:00 PM, call [place]

## 2020-04-03 LAB — APTT: aPTT: 71 seconds — ABNORMAL HIGH (ref 24–36)

## 2020-04-03 LAB — GLUCOSE, CAPILLARY
Glucose-Capillary: 83 mg/dL (ref 70–99)
Glucose-Capillary: 81 mg/dL (ref 70–99)
Glucose-Capillary: 84 mg/dL (ref 70–99)

## 2020-04-03 LAB — CBC
HCT: 28.2 % — ABNORMAL LOW (ref 36.0–46.0)
Hemoglobin: 9.6 g/dL — ABNORMAL LOW (ref 12.0–15.0)
MCH: 31 pg (ref 26.0–34.0)
MCHC: 34 g/dL (ref 30.0–36.0)
MCV: 91 fL (ref 80.0–100.0)
Platelets: 99 10*3/uL — ABNORMAL LOW (ref 150–400)
RBC: 3.1 MIL/uL — ABNORMAL LOW (ref 3.87–5.11)
RDW: 16.5 % — ABNORMAL HIGH (ref 11.5–15.5)
WBC: 8.2 10*3/uL (ref 4.0–10.5)
nRBC: 0 % (ref 0.0–0.2)

## 2020-04-03 MED ORDER — RIVAROXABAN 20 MG PO TABS
20.0000 mg | ORAL_TABLET | Freq: Every day | ORAL | Status: DC
  Administered 2020-04-03 – 2020-04-05 (×3): 20 mg via ORAL
  Filled 2020-04-03 (×3): qty 1

## 2020-04-03 NOTE — Progress Notes (Signed)
Mobility Specialist - Progress Note   04/03/20 1551  Mobility  Activity Ambulated in room  Level of Assistance Modified independent, requires aide device or extra time  Assistive Device Front wheel walker  Distance Ambulated (ft) 10 ft  Mobility Response Tolerated fair  Mobility performed by Mobility specialist  $Mobility charge 1 Mobility    Pre-mobility:.62 HR, 132/86 BP, 95% SpO2 Post-mobility: 77 HR, 136/81 BP, 97% SpO2  Pt ended ambulation due to worsening nausea, says it has felt the same as past days.    [place] Specialist

## 2020-04-03 NOTE — Progress Notes (Signed)
ANTICOAGULATION CONSULT NOTE - Follow Up Consult  Pharmacy Consult for argatroban to rivaroxaban Indication: clotted IVC and DVTs  Labs: Recent Labs    04/01/20 0354 04/01/20 0354 04/02/20 0422 04/03/20 0510  HGB 9.0*   < > 8.7* 9.6*  HCT 27.2*  --  26.2* 28.2*  PLT 74*  --  81* 99*  APTT 75*  --  75* 71*   < > = values in this interval not displayed.    Assessment: 85 yoF with PMH prior PE and DVT s/p IVC in 2012 on no anticoagulation at home admitted with iliac vein thrombus and occluded IVC filter. Pt started on IV heparin initially then transitioned to argatroban with suspected HIT (now resulted as negative 6/10). Pt s/p catheter-directed lysis and mechanical thrombectomy on 6/8 and 6/9 with no acute residual thrombus.  APTT this morning therapeutic at 71 seconds, pltc improved but still low at 99k, Hgb low but stable at 9.6 g/dl. Pharmacy asked to transition to rivaroxaban. Given low CBC, no residual acute thrombus per OP note 6/9, and prior parenteral anticoagulation use for ~2 weeks will defer rivaroxaban load and begin daily dosing.   Goal of Therapy:  Aptt goal 50-90s Monitor platelets per protocol: yes    Plan:  -Stop argatroban -Begin rivaroxaban 20mg  daily  Arrie Senate, PharmD, BCPS Clinical Pharmacist [phone] Please check AMION for all [place] numbers 04/03/2020

## 2020-04-03 NOTE — Progress Notes (Signed)
PROGRESS NOTE                                                                                                                                                                                                             Patient Demographics:    [name], is a 54 y.o. female, DOB - 08/20/1966, GBT:[id number]  Outpatient Primary MD for the patient is Glendale Chard, MD   Admit date - 03/21/2020   LOS - 12  Chief Complaint  Patient presents with  . Hypotension  . Back Pain       Brief Narrative: Patient is a 54 y.o. female with PMHx of DM-2, HLD, hypothyroidism, PE-s/p IVC filter in 2012, history of colon cancer-diagnosed with COVID-19 on 5/21-presented with fatigue/malaise, low back pain-found to have patchy airspace disease on CTA chest, MRI thoracic/lumbar spine showed T7 disc herniation-but also showed extensive clot in the IVC.  Subsequently a Doppler showed extensive DVT in the bilateral lower extremity.  She was started on IV heparin-vascular surgery was consulted.  See below for further details.  Significant Events: 6/1>> admit to [facility] for low back pain/weakness/malaise-found to have COVID-19 pneumonia on CT imaging. 6/2>> lower extremity Doppler/MRI LS spine-significant lower extremity DVT extending up to IVC filter 6/8>> catheter guided lytic therapy for bilateral lower extremity DVT 6/9>> mechanical thrombectomy/IVC filter removal 6/10>> hypotension overnight-requiring initiation of Levophed 6/11>> off Levophed since 6/10 evening-transfer out of ICU  Significant studies: 6/1>> CTA chest: No PE-multifocal patchy airspace opacities throughout both lungs. 6/1>> CT abdomen/pelvis: Hepatic steatosis, aortic atherosclerosis 6/2>> MRI thoracic spine: T7-8 central to left-sided disc herniation 6/2>> MRI lumbar spine: No significant disc pathology/no stenosis or neurocompression.  Thrombosis of the IVC, both iliac veins to  the level of the IVC filter. 6/2>> lower extremity Doppler: Bilateral lower extremity extensive DVT.  Procedures: 6/8>>  #1: Ultrasound-guided access, bilateral popliteal vein  #2: Intravascular ultrasound, bilateral femoral, common femoral, external iliac, common iliac veins, and inferior vena cava #3: Placement of bilateral intravenous infusion catheter for thrombolytic therapy into the vena cava.                         #4: Initiation of thrombolytic therapy (TPA into the inferior vena cava, common iliac, external iliac, common femoral, and femoral veins  6/9>> 1.  Ultrasound-guided cannulation  of right internal jugular vein 2.  Ultrasound-guided cannulation of left internal jugular vein 3.  Ultrasound-guided placement of 7 French double-lumen central venous line and left internal jugular vein 4.  Intravascular ultrasound of bilateral femoral, bilateral common femoral, bilateral external iliac and bilateral common iliac veins and IVC 5.  IVC venogram 6.  Angio VAC and cleaner wire mechanical thrombectomy of IVC, bilateral common iliac, bilateral external iliac, bilateral common femoral and bilateral femoral veins 7.  Stent of IVC with 18 x 90 mm Wallstent 8.  Ultrasound-guided cannulation right common femoral vein 9.  Veno venous bypass for 58 minutes  COVID-19 medications: Steroids:6/1>>6/9 Remdesivir:6/1>>6/6  Antibiotics: Zithromax: 6/1>> 6/4 Rocephin: 6/1>> 6/4  Microbiology data: 6/1>> blood culture: Negative 6/2>> urine culture: Negative  Consults: VVS  DVT prophylaxis: IV argatoban    Subjective:   No major issues overnight-had few BMs yesterday.   Assessment  & Plan :   Sepsis secondary to Covid 19 Viral pneumonia: Sepsis physiology has resolved-has completed a course of steroids/remdesivir.  Remains stable on room air.  Fever: afebrile  O2 requirements:  SpO2: 99 % O2 Flow Rate (L/min): 2 L/min   COVID-19 Labs: No results for input(s): DDIMER,  FERRITIN, LDH, CRP in the last 72 hours.     Component Value Date/Time   BNP 29.9 03/21/2020 2148    No results for input(s): PROCALCITON in the last 168 hours.  Lab Results  Component Value Date   SARSCOV2NAA POSITIVE (A) 03/10/2020    Prone/Incentive Spirometry: encouraged incentive spirometry use 3-4/hour.  Extensive bilateral lower extremity DVTs-involving bilateral iliac veins and extending to IVC up to the level of IVC filter: s/p bilateral popliteal lytic catheter placement on 6/8-and mechanical thrombectomy/IVC filter on 6/9-vascular surgery following.  Switched to argatroban due to thrombocytopenia-HIT antibody negative-platelet counts improving-will transition to Xarelto today.  Hypovolemic shock: Resolved-secondary to blood loss-resuscitated with IV fluids-briefly required Levophed while in the ICU.  BP now stable.  Acute blood loss anemia: Secondary to perioperative blood loss-see operative note-follow CBC-no indication for transfusion.  Thrombocytopenia: Continues to have stable thrombocytopenia-remains on argatroban-HIT antibody negative.  Platelet count slowly improving-follow closely.  AKI: Likely hemodynamically mediated-resolved  DM-2 (A1c 8.6) with uncontrolled hyperglycemia with mild DKA: Initially on insulin infusion-CBGs this a.m.-in the low eighties-stop Lantus-continue SSI and Tradjenta.  Recent Labs    04/02/20 1613 04/02/20 2131 04/03/20 0618  GLUCAP 87 81 83    T7-T8 disc herniation: Prior MD-Dr. Waldron Labs already discussed MRI findings with Dr. Jenkins-recommendations were for outpatient follow-up.  Hypothyroidism: Continue Synthroid.  TSH mildly elevated-not sure when her Synthroid dosing was adjusted last-stable for outpatient follow-up/adjustment with PCP.  HLD: Continue statin  Constipation: Resolved-continue MiraLAX/senna.  Deconditioning/debility: Secondary to acute illness-numerous procedures-evaluated by rehab services-recommendations are  for CIR-awaiting CIR evaluation  Nutrition Problem: Nutrition Problem: Increased nutrient needs Etiology: acute illness (COVID) Signs/Symptoms: estimated needs Interventions: Glucerna shake  ABG:    Component Value Date/Time   PHART 7.345 (L) 03/29/2020 1425   PCO2ART 42.2 03/29/2020 1425   PO2ART 200 (H) 03/29/2020 1425   HCO3 23.0 03/29/2020 1425   TCO2 24 03/29/2020 1425   ACIDBASEDEF 2.0 03/29/2020 1425   O2SAT 100.0 03/29/2020 1425    Vent Settings: N/A   Condition -Stable  Family Communication  : spouse over the phone 6/14  Code Status :  Full Code  Diet :  Diet Order            Diet Carb Modified Fluid consistency: Thin; Room service appropriate?  Yes  Diet effective now                  Disposition Plan  : Status is: Inpatient  Remains inpatient appropriate because:Inpatient level of care appropriate due to severity of illness  Dispo: The patient is from: Home              Anticipated d/c is to: Home              Anticipated d/c date is: 2 days              Patient currently is not medically stable to d/c.  Barriers to discharge: Extensive bilateral DVT-s/p catheter guided lytic mechanical thrombectomy-on IV argatrobam-await CIR eval  Antimicorbials  :    Anti-infectives (From admission, onward)   Start     Dose/Rate Route Frequency Ordered Stop   03/23/20 2200  azithromycin (ZITHROMAX) tablet 500 mg  Status:  Discontinued        500 mg Oral Daily at bedtime 03/23/20 0806 03/25/20 1233   03/23/20 1000  remdesivir 100 mg in sodium chloride 0.9 % 100 mL IVPB       "Followed by" Linked Group Details   100 mg 200 mL/hr over 30 Minutes Intravenous Daily 03/22/20 0110 03/26/20 1913   03/22/20 2200  azithromycin (ZITHROMAX) 500 mg in sodium chloride 0.9 % 250 mL IVPB  Status:  Discontinued        500 mg 250 mL/hr over 60 Minutes Intravenous Every 24 hours 03/22/20 0111 03/23/20 0806   03/22/20 2200  cefTRIAXone (ROCEPHIN) 1 g in sodium chloride 0.9 % 100  mL IVPB  Status:  Discontinued        1 g 200 mL/hr over 30 Minutes Intravenous Every 24 hours 03/22/20 0111 03/25/20 1233   03/22/20 0200  remdesivir 200 mg in sodium chloride 0.9% 250 mL IVPB       "Followed by" Linked Group Details   200 mg 580 mL/hr over 30 Minutes Intravenous Once 03/22/20 0110 03/22/20 1900   03/22/20 0015  cefTRIAXone (ROCEPHIN) 1 g in sodium chloride 0.9 % 100 mL IVPB        1 g 200 mL/hr over 30 Minutes Intravenous  Once 03/22/20 0010 03/22/20 0103   03/21/20 2300  cefTRIAXone (ROCEPHIN) injection 1 g  Status:  Discontinued        1 g Intramuscular  Once 03/21/20 2253 03/22/20 0010   03/21/20 2300  azithromycin (ZITHROMAX) 500 mg in sodium chloride 0.9 % 250 mL IVPB        500 mg 250 mL/hr over 60 Minutes Intravenous  Once 03/21/20 2253 03/22/20 0129      Inpatient Medications  Scheduled Meds: . vitamin C  500 mg Oral Daily  . Chlorhexidine Gluconate Cloth  6 each Topical Daily  . famotidine  20 mg Oral BID  . feeding supplement (GLUCERNA SHAKE)  237 mL Oral TID BM  . insulin aspart  0-15 Units Subcutaneous TID WC  . insulin aspart  0-5 Units Subcutaneous QHS  . levothyroxine  100 mcg Oral Q0600  . linagliptin  5 mg Oral Daily  . multivitamin with minerals  1 tablet Oral Daily  . senna-docusate  2 tablet Oral QHS  . simvastatin  10 mg Oral Daily  . sodium chloride flush  10-40 mL Intracatheter Q12H  . sodium chloride flush  3 mL Intravenous Q12H  . zinc sulfate  220 mg Oral Daily   Continuous Infusions: . sodium chloride Stopped (  03/31/20 0846)  . argatroban 0.5 mcg/kg/min (04/02/20 2147)  . sodium chloride     PRN Meds:.sodium chloride, acetaminophen, albuterol, bisacodyl, guaiFENesin-dextromethorphan, HYDROmorphone (DILAUDID) injection, loperamide, ondansetron **OR** ondansetron (ZOFRAN) IV, oxyCODONE, sodium chloride, sodium chloride flush, traMADol, zolpidem   Time Spent in minutes  25  See all Orders from today for further  details   Oren Binet M.D on 04/03/2020 at 11:22 AM  To page go to [email or web address] - use universal password  [facility] -  Office  [phone]    Objective:   Vitals:   04/03/20 0300 04/03/20 0452 04/03/20 0453 04/03/20 0800  BP:   133/86 (!) 152/84  Pulse:   74   Resp:  13 13 13   Temp:   98.1 F (36.7 C) 98.3 F (36.8 C)  TempSrc:   Oral Oral  SpO2:   97% 99%  Weight: 102.9 kg 102.1 kg    Height:        Wt Readings from Last 3 Encounters:  04/03/20 102.1 kg  02/15/20 91.2 kg  08/02/19 95.3 kg     Intake/Output Summary (Last 24 hours) at 04/03/2020 1122 Last data filed at 04/03/2020 0935 Gross per 24 hour  Intake 150.16 ml  Output 1300 ml  Net -1149.84 ml     Physical Exam Gen Exam:Alert awake-not in any distress HEENT:atraumatic, normocephalic Chest: B/L clear to auscultation anteriorly CVS:S1S2 regular Abdomen:soft non tender, non distended Extremities:no edema Neurology: Non focal Skin: no rash   Data Review:    CBC Recent Labs  Lab 03/29/20 1614 03/30/20 0241 03/30/20 0922 03/31/20 0437 04/01/20 0354 04/02/20 0422 04/03/20 0510  WBC 14.6*   < > 10.2 8.9 8.6 6.9 8.2  HGB 12.4   < > 8.8* 8.7* 9.0* 8.7* 9.6*  HCT 36.7   < > 25.6* 25.5* 27.2* 26.2* 28.2*  PLT PLATELET CLUMPS NOTED ON SMEAR, UNABLE TO ESTIMATE   < > 66* 68* 74* 81* 99*  MCV 86.6   < > 85.0 87.6 90.1 89.7 91.0  MCH 29.2   < > 29.2 29.9 29.8 29.8 31.0  MCHC 33.8   < > 34.4 34.1 33.1 33.2 34.0  RDW 15.3   < > 17.0* 17.2* 16.5* 16.1* 16.5*  LYMPHSABS 1.4  --  1.1  --   --   --   --   MONOABS 1.0  --  0.6  --   --   --   --   EOSABS 0.0  --  0.0  --   --   --   --   BASOSABS 0.0  --  0.0  --   --   --   --    < > = values in this interval not displayed.    Chemistries  Recent Labs  Lab 03/27/20 1136 03/27/20 1136 03/28/20 0517 03/28/20 0517 03/29/20 0615 03/29/20 0949 03/29/20 1425 03/29/20 1614 03/30/20 0241 03/30/20 0922 03/31/20 0437  NA 135   < > 137    < > 137   < > 138 137 135 136 136  K 4.1   < > 4.4   < > 5.2*   < > 3.8 4.1 4.7 4.0 3.9  CL 102   < > 103   < > 105  --   --  108 106 107 108  CO2 26   < > 25   < > 26  --   --  19* 21* 22 23  GLUCOSE 214*   < > 164*   < >  182*  --   --  298* 211* 173* 135*  BUN 26*   < > 27*   < > 22*  --   --  18 21* 15 9  CREATININE 0.92   < > 0.89   < > 0.87  --   --  0.63 0.87 0.70 0.60  CALCIUM 9.1   < > 9.1   < > 8.8*  --   --  7.7* 7.7* 7.6* 7.9*  AST 20  --  18  --  31  --   --   --  16  --  15  ALT 30  --  28  --  35  --   --   --  22  --  22  ALKPHOS 73  --  68  --  82  --   --   --  40  --  47  BILITOT 0.7  --  0.7  --  1.6*  --   --   --  1.1  --  1.1   < > = values in this interval not displayed.   ------------------------------------------------------------------------------------------------------------------ No results for input(s): CHOL, HDL, LDLCALC, TRIG, CHOLHDL, LDLDIRECT in the last 72 hours.  Lab Results  Component Value Date   HGBA1C 8.6 (H) 03/23/2020   ------------------------------------------------------------------------------------------------------------------ No results for input(s): TSH, T4TOTAL, T3FREE, THYROIDAB in the last 72 hours.  Invalid input(s): FREET3 ------------------------------------------------------------------------------------------------------------------ No results for input(s): VITAMINB12, FOLATE, FERRITIN, TIBC, IRON, RETICCTPCT in the last 72 hours.  Coagulation profile No results for input(s): INR, PROTIME in the last 168 hours.  No results for input(s): DDIMER in the last 72 hours.  Cardiac Enzymes No results for input(s): CKMB, TROPONINI, MYOGLOBIN in the last 168 hours.  Invalid input(s): CK ------------------------------------------------------------------------------------------------------------------    Component Value Date/Time   BNP 29.9 03/21/2020 2148    Micro Results No results found for this or any previous visit (from  the past 240 hour(s)).  Radiology Reports CT Angio Chest PE W and/or Wo Contrast  Result Date: 03/22/2020 CLINICAL DATA:  Severe back pain hypotension and shallow EXAM: CT ANGIOGRAPHY CHEST WITH CONTRAST TECHNIQUE: Multidetector CT imaging of the chest was performed using the standard protocol during bolus administration of intravenous contrast. Multiplanar CT image reconstructions and MIPs were obtained to evaluate the vascular anatomy. CONTRAST:  14mL OMNIPAQUE IOHEXOL 350 MG/ML SOLN COMPARISON:  None. FINDINGS: Cardiovascular: There is a optimal opacification of the pulmonary arteries. There is no central,segmental, or subsegmental filling defects within the pulmonary arteries. The heart is normal in size. No pericardial effusion or thickening. No evidence right heart strain. There is normal three-vessel brachiocephalic anatomy without proximal stenosis. The thoracic aorta is normal in appearance. Mediastinum/Nodes: No hilar, mediastinal, or axillary adenopathy. Thyroid gland, trachea, and esophagus demonstrate no significant findings. Lungs/Pleura: Multifocal patchy ground-glass opacities are seen predominantly within the periphery of both lungs. No pleural effusion or pneumothorax is seen. Upper Abdomen: No acute abnormalities present in the visualized portions of the upper abdomen. Musculoskeletal: No chest wall abnormality. No acute or significant osseous findings. Review of the MIP images confirms the above findings. Abdomen/pelvis: Hepatobiliary: There is diffuse low density seen throughout the liver parenchyma. The main portal vein is patent. No evidence of calcified gallstones, gallbladder wall thickening or biliary dilatation. Pancreas: Unremarkable. No pancreatic ductal dilatation or surrounding inflammatory changes. Spleen: Normal in size without focal abnormality. Adrenals/Urinary Tract: Both adrenal glands appear normal. The kidneys and collecting system appear normal without evidence of urinary  tract  calculus or hydronephrosis. Bladder is unremarkable. Stomach/Bowel: The stomach, small bowel, and colon are normal in appearance. No inflammatory changes, wall thickening, or obstructive findings. There appears to be a surgical anastomosis at the sigmoid rectal junction. Mild presacral soft tissue density is again identified. Vascular/Lymphatic: There are no enlarged mesenteric, retroperitoneal, or pelvic lymph nodes. An infrarenal IVC filter is seen at approximately the L3 level. There is scattered aortic and bi-iliac calcifications noted. Reproductive: The uterus and adnexa are unremarkable. Other: No evidence of abdominal wall mass or hernia. Musculoskeletal: No acute or significant osseous findings. IMPRESSION: No central, segmental, or subsegmental pulmonary embolism. Multifocal patchy airspace opacities throughout both lungs, likely consistent with multifocal pneumonia. Hepatic steatosis Aortic Atherosclerosis (ICD10-I70.0). Electronically Signed   By: Prudencio Pair M.D.   On: 03/22/2020 00:07   MR THORACIC SPINE W WO CONTRAST  Result Date: 03/22/2020 CLINICAL DATA:  Bilateral lower extremity pain and numbness with urinary retention. Diabetes. History of colon cancer. EXAM: MRI THORACIC WITHOUT AND WITH CONTRAST TECHNIQUE: Multiplanar and multiecho pulse sequences of the thoracic spine were obtained without and with intravenous contrast. CONTRAST:  9.39mL GADAVIST GADOBUTROL 1 MMOL/ML IV SOLN COMPARISON:  CT chest done yesterday. FINDINGS: MRI THORACIC SPINE FINDINGS Alignment:  Normal Vertebrae: Normal. No fracture or primary bone lesion. No metastatic disease. Insignificant small hemangioma within the T11 vertebral body. Cord: No primary cord pathology. See below regarding stenosis at T7-8. Paraspinal and other soft tissues: Negative Disc levels: No abnormality at T5-6 or above. T6-7: Small right paracentral disc protrusion indents the thecal sac slightly but does not cause apparent neural  compression. T7-8: Left posterolateral disc herniation effaces the ventral subarachnoid space and indents the left side of the cord. This could be symptomatic. T8-9 through T12-L1: Normal. 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 Ample subarachnoid space is present dorsal to the cord at this level. None the less, this could possibly be symptomatic. Small right paracentral disc herniation at T6-7 without apparent neural compression. Electronically Signed   By: Nelson Chimes M.D.   On: 03/22/2020 12:32   MR Lumbar Spine W Wo Contrast  Result Date: 03/22/2020 CLINICAL DATA:  Back pain.  Urinary retention.  Diabetes. EXAM: MRI LUMBAR SPINE WITHOUT AND WITH CONTRAST TECHNIQUE: Multiplanar and multiecho pulse sequences of the lumbar spine were obtained without and with intravenous contrast. CONTRAST:  9.73mL GADAVIST GADOBUTROL 1 MMOL/ML IV SOLN COMPARISON:  None. FINDINGS: Segmentation:  5 lumbar type vertebral bodies. Alignment:  Slightly exaggerated lower lumbar lordosis. Vertebrae:  No fracture or primary bone lesion. Conus medullaris and cauda equina: Conus extends to the L1 level. Conus and cauda equina appear normal. Paraspinal and other soft tissues: There appears be thrombosis of the IVC and both iliac veins up to the level of the IVC filter. Flow is restored in the IVC by inflow from the renal veins. Disc levels: No abnormality from L1-2 through L2-3. L3-4: No disc abnormality.  Mild facet hypertrophy.  No stenosis. L4-5: No disc abnormality. Mild to moderate facet hypertrophy. No stenosis. L5-S1: Minimal disc bulge.  Mild facet hypertrophy.  No stenosis. IMPRESSION: No significant disc pathology. No stenosis or neural compression in the lumbar region. Patient has exaggerated lumbar lordosis. 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 There is thrombosis of the IVC and  both iliac veins to the level of the IVC filter. Flow is restored in the IVC above that by inflow from the renal veins. I am in the process of calling this report. Electronically Signed   By: Nelson Chimes M.D.   On: 03/22/2020 12:46   CT ABDOMEN PELVIS W CONTRAST  Result Date: 03/22/2020 CLINICAL DATA:  Severe back pain hypotension and shallow EXAM: CT ANGIOGRAPHY CHEST WITH CONTRAST TECHNIQUE: Multidetector CT imaging of the chest was performed using the standard protocol during bolus administration of intravenous contrast. Multiplanar CT image reconstructions and MIPs were obtained to evaluate the vascular anatomy. CONTRAST:  72mL OMNIPAQUE IOHEXOL 350 MG/ML SOLN COMPARISON:  None. FINDINGS: Cardiovascular: There is a optimal opacification of the pulmonary arteries. There is no central,segmental, or subsegmental filling defects within the pulmonary arteries. The heart is normal in size. No pericardial effusion or thickening. No evidence right heart strain. There is normal three-vessel brachiocephalic anatomy without proximal stenosis. The thoracic aorta is normal in appearance. Mediastinum/Nodes: No hilar, mediastinal, or axillary adenopathy. Thyroid gland, trachea, and esophagus demonstrate no significant findings. Lungs/Pleura: Multifocal patchy ground-glass opacities are seen predominantly within the periphery of both lungs. No pleural effusion or pneumothorax is seen. Upper Abdomen: No acute abnormalities present in the visualized portions of the upper abdomen. Musculoskeletal: No chest wall abnormality. No acute or significant osseous findings. Review of the MIP images confirms the above findings. Abdomen/pelvis: Hepatobiliary: There is diffuse low density seen throughout the liver parenchyma. The main portal vein is patent. No evidence of calcified gallstones, gallbladder wall thickening or biliary dilatation. Pancreas:  Unremarkable. No pancreatic ductal dilatation or surrounding inflammatory changes. Spleen: Normal in size without focal abnormality. Adrenals/Urinary Tract: Both adrenal glands appear normal. The kidneys and collecting system appear normal without evidence of urinary tract calculus or hydronephrosis. Bladder is unremarkable. Stomach/Bowel: The stomach, small bowel, and colon are normal in appearance. No inflammatory changes, wall thickening, or obstructive findings. There appears to be a surgical anastomosis at the sigmoid rectal junction. Mild presacral soft tissue density is again identified. Vascular/Lymphatic: There are no enlarged mesenteric, retroperitoneal, or pelvic lymph nodes. An infrarenal IVC filter is seen at approximately the L3 level. There is scattered aortic and bi-iliac calcifications noted. Reproductive: The uterus and adnexa are unremarkable. Other: No evidence of abdominal wall mass or hernia. Musculoskeletal: No acute or significant osseous findings. IMPRESSION: No central, segmental, or subsegmental pulmonary embolism. Multifocal patchy airspace opacities throughout both lungs, likely consistent with multifocal pneumonia. Hepatic steatosis Aortic Atherosclerosis (ICD10-I70.0). Electronically Signed   By: Prudencio Pair M.D.   On: 03/22/2020 00:07   DG Chest Port 1 View  Result Date: 03/21/2020 CLINICAL DATA:  54 year old female with weakness and hypotension. EXAM: PORTABLE CHEST 1 VIEW COMPARISON:  Chest radiograph dated 03/25/2016 FINDINGS: Faint bilateral peripheral and subpleural densities, right greater left concerning for pneumonia, possibly atypical or viral in etiology including COVID-19. Clinical correlation is recommended. No focal consolidation, pleural effusion, pneumothorax. The cardiac silhouette is within limits. No acute osseous pathology. IMPRESSION: Findings concerning for pneumonia. Electronically Signed   By: Anner Crete M.D.   On: 03/21/2020 22:17   VAS [place] LOWER  EXTREMITY VENOUS (DVT)  Result Date: 03/22/2020  Lower Venous DVTStudy Indications: Hx DVT and IVC filter, now with COVID and elevated D dimer.  Comparison Study: 06/07/15 negative Performing Technologist: June Leap RDMS, RVT  Examination Guidelines: A complete evaluation includes B-mode imaging, spectral Doppler, color Doppler, and power Doppler as needed of all accessible portions of  each vessel. Bilateral testing is considered an integral part of a complete examination. Limited examinations for reoccurring indications may be performed as noted. The reflux portion of the exam is performed with the patient in reverse Trendelenburg.  +---------+---------------+---------+-----------+----------+-----------------+ RIGHT    CompressibilityPhasicitySpontaneityPropertiesThrombus Aging    +---------+---------------+---------+-----------+----------+-----------------+ CFV      None           No       No                   Acute             +---------+---------------+---------+-----------+----------+-----------------+ SFJ      None                                         Acute             +---------+---------------+---------+-----------+----------+-----------------+ FV Prox  None                                         Acute             +---------+---------------+---------+-----------+----------+-----------------+ FV Mid   None                                         Acute             +---------+---------------+---------+-----------+----------+-----------------+ FV DistalNone                                         Acute             +---------+---------------+---------+-----------+----------+-----------------+ PFV      None                                         Acute             +---------+---------------+---------+-----------+----------+-----------------+ POP      None           No       No                   Acute              +---------+---------------+---------+-----------+----------+-----------------+ PTV      None                                         Acute             +---------+---------------+---------+-----------+----------+-----------------+ PERO     None                                         Acute             +---------+---------------+---------+-----------+----------+-----------------+ Gastroc  None  Acute             +---------+---------------+---------+-----------+----------+-----------------+ GSV      None                                         Acute             +---------+---------------+---------+-----------+----------+-----------------+ Ex Iliac                No       No                   Age Indeterminate +---------+---------------+---------+-----------+----------+-----------------+ Com Iliac                                             Not visualized    +---------+---------------+---------+-----------+----------+-----------------+   +---------+---------------+---------+-----------+----------+-----------------+ LEFT     CompressibilityPhasicitySpontaneityPropertiesThrombus Aging    +---------+---------------+---------+-----------+----------+-----------------+ CFV      None           No       No                   Acute             +---------+---------------+---------+-----------+----------+-----------------+ SFJ      None                                         Acute             +---------+---------------+---------+-----------+----------+-----------------+ FV Prox  None                                         Acute             +---------+---------------+---------+-----------+----------+-----------------+ FV Mid   None                                         Acute             +---------+---------------+---------+-----------+----------+-----------------+ FV DistalNone                                          Acute             +---------+---------------+---------+-----------+----------+-----------------+ PFV      None                                         Acute             +---------+---------------+---------+-----------+----------+-----------------+ POP      None           No       No                   Acute             +---------+---------------+---------+-----------+----------+-----------------+ PTV  None                                         Acute             +---------+---------------+---------+-----------+----------+-----------------+ PERO     None                                         Acute             +---------+---------------+---------+-----------+----------+-----------------+ Gastroc  None                                         Acute             +---------+---------------+---------+-----------+----------+-----------------+ GSV      None                                         Acute             +---------+---------------+---------+-----------+----------+-----------------+ Ex Iliac                No       No                   Age Indeterminate +---------+---------------+---------+-----------+----------+-----------------+ Com Iliac                                             Not visualized    +---------+---------------+---------+-----------+----------+-----------------+     Summary: RIGHT: - Findings consistent with acute deep vein thrombosis involving the right common femoral vein, SF junction, right femoral vein, right proximal profunda vein, right popliteal vein, right posterior tibial veins, right peroneal veins, and right gastrocnemius veins. - Findings consistent with acute superficial vein thrombosis involving the right great saphenous vein. - Thrombosis extends into the Right Iliac vein and IVC up to the level of the filter placement.  LEFT: - Findings consistent with acute deep vein thrombosis involving the left common femoral  vein, SF junction, left femoral vein, left proximal profunda vein, left popliteal vein, left posterior tibial veins, left peroneal veins, and left gastrocnemius veins. - Findings consistent with acute superficial vein thrombosis involving the left great saphenous vein. - Thrombosis extends into the Left Iliac vein.  *See table(s) above for measurements and observations. Electronically signed by Curt Jews MD on 03/22/2020 at 5:19:17 PM.    Final    HYBRID OR IMAGING ([facility])  Result Date: 03/29/2020 There is no interpretation for this exam.  This order is for images obtained during a surgical procedure.  Please See "Surgeries" Tab for more information regarding the procedure.   HYBRID OR IMAGING ([facility] ONLY)  Result Date: 03/28/2020 There is no interpretation for this exam.  This order is for images obtained during a surgical procedure.  Please See "Surgeries" Tab for more information regarding the procedure.

## 2020-04-03 NOTE — Progress Notes (Signed)
Inpatient Rehab Admissions:  Inpatient Rehab Consult received.  I met with pt at the bedside for rehabilitation assessment and to discuss goals and expectations of an inpatient rehab admission.  Feel pt is a good candidate for CIR. We discussed anticipated LOS, expected functional outcomes and expectations with therapy. Pt would like to pursue this program. I have began insurance authorization process for possible admit. Will update once there has been a determination.   Raechel Ache, OTR/L  Rehab Admissions Coordinator  [phone] 04/03/2020 7:59 PM

## 2020-04-03 NOTE — Progress Notes (Addendum)
  Progress Note    04/03/2020 7:34 AM 5 Days Post-Op  Subjective:  BLE feeling better.  Still with abd pain, bloating and nausea with p.o. intake   Vitals:   04/03/20 0452 04/03/20 0453  BP:  133/86  Pulse:  74  Resp: 13 13  Temp:  98.1 F (36.7 C)  SpO2:  97%   Physical Exam: Lungs:  Non labored Extremities:  Compression stockings in place; edema improved Abdomen:  Soft but distended; diffuse tenderness to deep palpation Neurologic: A&O  CBC    Component Value Date/Time   WBC 8.2 04/03/2020 0510   RBC 3.10 (L) 04/03/2020 0510   HGB 9.6 (L) 04/03/2020 0510   HGB 13.3 12/17/2013 1538   HCT 28.2 (L) 04/03/2020 0510   HCT 40.6 12/17/2013 1538   PLT 99 (L) 04/03/2020 0510   PLT 219 12/17/2013 1538   MCV 91.0 04/03/2020 0510   MCV 90.3 12/17/2013 1538   MCH 31.0 04/03/2020 0510   MCHC 34.0 04/03/2020 0510   RDW 16.5 (H) 04/03/2020 0510   RDW 13.5 12/17/2013 1538   LYMPHSABS 1.1 03/30/2020 0922   LYMPHSABS 1.4 12/17/2013 1538   MONOABS 0.6 03/30/2020 0922   MONOABS 0.3 12/17/2013 1538   EOSABS 0.0 03/30/2020 0922   EOSABS 0.0 12/17/2013 1538   BASOSABS 0.0 03/30/2020 0922   BASOSABS 0.0 12/17/2013 1538    BMET    Component Value Date/Time   NA 136 03/31/2020 0437   NA 144 01/21/2018 1000   NA 142 12/17/2013 1538   K 3.9 03/31/2020 0437   K 4.1 12/17/2013 1538   CL 108 03/31/2020 0437   CL 106 12/07/2012 0838   CO2 23 03/31/2020 0437   CO2 28 12/17/2013 1538   GLUCOSE 135 (H) 03/31/2020 0437   GLUCOSE 123 12/17/2013 1538   GLUCOSE 119 (H) 12/07/2012 0838   BUN 9 03/31/2020 0437   BUN 12 01/21/2018 1000   BUN 14.1 12/17/2013 1538   CREATININE 0.60 03/31/2020 0437   CREATININE 0.96 10/27/2015 0904   CREATININE 0.8 12/17/2013 1538   CALCIUM 7.9 (L) 03/31/2020 0437   CALCIUM 9.7 12/17/2013 1538   GFRNONAA >60 03/31/2020 0437   GFRAA >60 03/31/2020 0437    INR    Component Value Date/Time   INR 1.5 (H) 03/21/2020 2143   INR 2.10 12/07/2012 1025       Intake/Output Summary (Last 24 hours) at 04/03/2020 0734 Last data filed at 04/03/2020 0544 Gross per 24 hour  Intake 150.16 ml  Output 1550 ml  Net -1399.84 ml     Assessment/Plan:  54 y.o. female is s/p BLE venous thrombectomy with IVC filter removal and IVC stent 5 Days Post-Op   Continue elevation and compression; BLE edema seems to have improved Hgb improving Encouraged OOB and ambulation today Ok for discharge when tolerating diet, abd pain improved, and transitioned to [place], PA-C [facility] [phone] 04/03/2020 7:34 AM  I have independently interviewed and examined patient and agree with PA assessment and plan above.  Her legs have demonstrated significant improvement in edema with compression stockings in place.  Her H&H is stable platelets are improving was likely consumptive from retroperitoneal hematoma.  She does have abdominal discomfort is tolerating minimal p.o.  Planning to walk with physical therapy today and okay to transition to oral anticoagulation at this time.  Asta Corbridge C. Donzetta Matters, MD [facility] [place] Office: [phone] Pager: [phone]

## 2020-04-03 NOTE — Progress Notes (Signed)
Alert and oriented x4. Vital sings stable. Remained afebrile. On room air, SPO2 94-98%, NSR no monitor, HR 90s-109, BP 136/86 - 152/ 89 mmHg. No immediate distress noted.   Pain tolerated well, Oxycodone given as needed. On stock compression above knee bilateral legs. We palpated, got good and strong PD pulse on left foot, but weaker on the right foot. MD aware per RN day shift reported. Denied tingling or numbness.   RED ROBIN presented on left and right popliteal area. Handed off to [place], RN 11.00-07.00 shift to report to PA or MD at AM.  Kennyth Lose, RN

## 2020-04-03 NOTE — TOC Benefit Eligibility Note (Signed)
Transition of Care [facility]) Benefit Eligibility Note    Patient Details  Name: [name] MRN: [id number] Date of Birth: [birth year]   Medication/Dose: Alveda Reasons 20mg  qd  Covered?: Yes  Tier: 2 Drug  Prescription Coverage Preferred Pharmacy: any pharmacy except [facility]  Spoke with Person/Company/Phone Number:: [employer]  Co-Pay: $40  Prior Approval: No   [place] Phone Number: 04/03/2020, 2:32 PM

## 2020-04-04 LAB — CBC
HCT: 30.3 % — ABNORMAL LOW (ref 36.0–46.0)
Hemoglobin: 9.8 g/dL — ABNORMAL LOW (ref 12.0–15.0)
MCH: 29.8 pg (ref 26.0–34.0)
MCHC: 32.3 g/dL (ref 30.0–36.0)
MCV: 92.1 fL (ref 80.0–100.0)
Platelets: 114 10*3/uL — ABNORMAL LOW (ref 150–400)
RBC: 3.29 MIL/uL — ABNORMAL LOW (ref 3.87–5.11)
RDW: 16.7 % — ABNORMAL HIGH (ref 11.5–15.5)
WBC: 11.5 10*3/uL — ABNORMAL HIGH (ref 4.0–10.5)
nRBC: 0 % (ref 0.0–0.2)

## 2020-04-04 LAB — GLUCOSE, CAPILLARY
Glucose-Capillary: 96 mg/dL (ref 70–99)
Glucose-Capillary: 129 mg/dL — ABNORMAL HIGH (ref 70–99)
Glucose-Capillary: 118 mg/dL — ABNORMAL HIGH (ref 70–99)
Glucose-Capillary: 107 mg/dL — ABNORMAL HIGH (ref 70–99)

## 2020-04-04 MED ORDER — MUPIROCIN 2 % EX OINT
TOPICAL_OINTMENT | Freq: Two times a day (BID) | CUTANEOUS | Status: DC
  Administered 2020-04-04 – 2020-04-09 (×2): 1 via TOPICAL
  Filled 2020-04-04: qty 22

## 2020-04-04 MED ORDER — PROMETHAZINE HCL 25 MG/ML IJ SOLN
25.0000 mg | Freq: Four times a day (QID) | INTRAMUSCULAR | Status: DC | PRN
  Administered 2020-04-04 – 2020-04-05 (×3): 25 mg via INTRAVENOUS
  Filled 2020-04-04 (×3): qty 1

## 2020-04-04 MED FILL — Electrolyte-R (PH 7.4) Solution: INTRAVENOUS | Qty: 4000 | Status: AC

## 2020-04-04 NOTE — Consult Note (Signed)
[place] Nurse Consult Note: Patient receiving care in [place]. Reason for Consult: assessment and recommendation for wounds behind knees, neck Wound type: MDRPI, evolving DTPIs Pressure Injury POA: No Measurements: Bilateral popliteal evolving DTPIs measure approximately 2.5 cm x 0.8 cm with left popliteal wound having a small area of loss of overlying skin.  Both wounds have detachment of overlying skin.  The right clavicular wound is dry, golden in color and measures approximately 1.2 cmx 0.9 cm.  The left clavicular wound is also dry and golden in color and measures approximately 1.5 cm x 1.5 cm. Wound bed: Drainage (amount, consistency, odor)  None from any of the wounds Periwound: intact Dressing procedure/placement/frequency: Twice daily application of bactroban 2% ointment with a small foam dressing.  The dressings can be changed daily. Monitor the wound area(s) for worsening of condition such as: Signs/symptoms of infection,  Increase in size,  Development of or worsening of odor, Development of pain, or increased pain at the affected locations.  Notify the medical team if any of these develop.  Thank you for the consult.  Discussed plan of care with the patient and bedside nurse. Val Riles, RN, MSN, CWOCN, CNS-BC, pager [phone]

## 2020-04-04 NOTE — Progress Notes (Signed)
PROGRESS NOTE                                                                                                                                                                                                             Patient Demographics:    [name], is a 54 y.o. female, DOB - [birth year], GEX:[id number]  Outpatient Primary MD for the patient is Glendale Chard, MD   Admit date - 03/21/2020   LOS - 20  Chief Complaint  Patient presents with  . Hypotension  . Back Pain       Brief Narrative: Patient is a 54 y.o. female with PMHx of DM-2, HLD, hypothyroidism, PE-s/p IVC filter in 2012, history of colon cancer-diagnosed with COVID-19 on 5/21-presented with fatigue/malaise, low back pain-found to have patchy airspace disease on CTA chest, MRI thoracic/lumbar spine showed T7 disc herniation-but also showed extensive clot in the IVC.  Subsequently a Doppler showed extensive DVT in the bilateral lower extremity.  She was started on IV heparin-vascular surgery was consulted.  See below for further details.  Significant Events: 6/1>> admit to [facility] for low back pain/weakness/malaise-found to have COVID-19 pneumonia on CT imaging. 6/2>> lower extremity Doppler/MRI LS spine-significant lower extremity DVT extending up to IVC filter 6/8>> catheter guided lytic therapy for bilateral lower extremity DVT 6/9>> mechanical thrombectomy/IVC filter removal 6/10>> hypotension overnight-requiring initiation of Levophed 6/11>> off Levophed since 6/10 evening-transfer out of ICU  Significant studies: 6/1>> CTA chest: No PE-multifocal patchy airspace opacities throughout both lungs. 6/1>> CT abdomen/pelvis: Hepatic steatosis, aortic atherosclerosis 6/2>> MRI thoracic spine: T7-8 central to left-sided disc herniation 6/2>> MRI lumbar spine: No significant disc pathology/no stenosis or neurocompression.  Thrombosis of the IVC, both iliac veins to  the level of the IVC filter. 6/2>> lower extremity Doppler: Bilateral lower extremity extensive DVT.  Procedures: 6/8>>  #1: Ultrasound-guided access, bilateral popliteal vein  #2: Intravascular ultrasound, bilateral femoral, common femoral, external iliac, common iliac veins, and inferior vena cava #3: Placement of bilateral intravenous infusion catheter for thrombolytic therapy into the vena cava.                         #4: Initiation of thrombolytic therapy (TPA into the inferior vena cava, common iliac, external iliac, common femoral, and femoral veins  6/9>> 1.  Ultrasound-guided cannulation  of right internal jugular vein 2.  Ultrasound-guided cannulation of left internal jugular vein 3.  Ultrasound-guided placement of 7 French double-lumen central venous line and left internal jugular vein 4.  Intravascular ultrasound of bilateral femoral, bilateral common femoral, bilateral external iliac and bilateral common iliac veins and IVC 5.  IVC venogram 6.  Angio VAC and cleaner wire mechanical thrombectomy of IVC, bilateral common iliac, bilateral external iliac, bilateral common femoral and bilateral femoral veins 7.  Stent of IVC with 18 x 90 mm Wallstent 8.  Ultrasound-guided cannulation right common femoral vein 9.  Veno venous bypass for 58 minutes  COVID-19 medications: Steroids:6/1>>6/9 Remdesivir:6/1>>6/6  Antibiotics: Zithromax: 6/1>> 6/4 Rocephin: 6/1>> 6/4  Microbiology data: 6/1>> blood culture: Negative 6/2>> urine culture: Negative  Consults: VVS  DVT prophylaxis: IV argatoban    Subjective:   Appetite improving-did not have BM yesterday but had several BMs the day before.   Assessment  & Plan :   Sepsis secondary to Covid 19 Viral pneumonia: Sepsis physiology has resolved-has completed a course of steroids/remdesivir.  Remains stable on room air.  Fever: afebrile  O2 requirements:  SpO2: 96 % O2 Flow Rate (L/min): 2 L/min   COVID-19 Labs: No  results for input(s): DDIMER, FERRITIN, LDH, CRP in the last 72 hours.     Component Value Date/Time   BNP 29.9 03/21/2020 2148    No results for input(s): PROCALCITON in the last 168 hours.  Lab Results  Component Value Date   SARSCOV2NAA POSITIVE (A) 03/10/2020    Prone/Incentive Spirometry: encouraged incentive spirometry use 3-4/hour.  Extensive bilateral lower extremity DVTs-involving bilateral iliac veins and extending to IVC up to the level of IVC filter: s/p bilateral popliteal lytic catheter placement on 6/8-and mechanical thrombectomy/IVC filter on 6/9-vascular surgery following.  Switched to argatroban due to thrombocytopenia-HIT antibody negative-platelet counts improving-has been transitioned to Xarelto on 6/14.  Hypovolemic shock: Resolved-secondary to blood loss-resuscitated with IV fluids-briefly required Levophed while in the ICU.  BP now stable.  Acute blood loss anemia: Secondary to perioperative blood loss-see operative note-follow CBC-no indication for transfusion.  Hemoglobin slowly improving.  Thrombocytopenia: Continues to have stable thrombocytopenia-remains on argatroban-HIT antibody negative.  Platelet count slowly improving-follow closely.  AKI: Likely hemodynamically mediated-resolved  DM-2 (A1c 8.6) with uncontrolled hyperglycemia with mild DKA: CBG stable with SSI and Tradjenta.  Long-acting insulin discontinued on 6/14 as CBGs only in the 80s/90s.  Recent Labs    04/03/20 1651 04/03/20 2122 04/04/20 0614  GLUCAP 81 84 96    T7-T8 disc herniation: Prior MD-Dr. Waldron Labs already discussed MRI findings with Dr. Jenkins-recommendations were for outpatient follow-up.  Hypothyroidism: Continue Synthroid.  TSH mildly elevated-not sure when her Synthroid dosing was adjusted last-stable for outpatient follow-up/adjustment with PCP.  HLD: Continue statin  Constipation: Resolved-continue MiraLAX/senna.  Deconditioning/debility: Secondary to acute  illness/blood loss anemia/numerous procedures-has nausea/pain/tachycardia with ambulation -evaluated by rehab services-recommendations are for [employer] authorization.  Nutrition Problem: Nutrition Problem: Increased nutrient needs Etiology: acute illness (COVID) Signs/Symptoms: estimated needs Interventions: Glucerna shake  ABG:    Component Value Date/Time   PHART 7.345 (L) 03/29/2020 1425   PCO2ART 42.2 03/29/2020 1425   PO2ART 200 (H) 03/29/2020 1425   HCO3 23.0 03/29/2020 1425   TCO2 24 03/29/2020 1425   ACIDBASEDEF 2.0 03/29/2020 1425   O2SAT 100.0 03/29/2020 1425    Vent Settings: N/A   Condition -Stable  Family Communication  : spouse over the phone 6/14  Code Status :  Full Code  Diet :  Diet Order            Diet Carb Modified Fluid consistency: Thin; Room service appropriate? Yes  Diet effective now                  Disposition Plan  : Status is: Inpatient  Remains inpatient appropriate because:Inpatient level of care appropriate due to severity of illness  Dispo: The patient is from: Home              Anticipated d/c is to: Home              Anticipated d/c date is: 2 days              Patient currently is medically stable to d/c.  Barriers to discharge: Awaiting insurance authorization for CIR.  Antimicorbials  :    Anti-infectives (From admission, onward)   Start     Dose/Rate Route Frequency Ordered Stop   03/23/20 2200  azithromycin (ZITHROMAX) tablet 500 mg  Status:  Discontinued        500 mg Oral Daily at bedtime 03/23/20 0806 03/25/20 1233   03/23/20 1000  remdesivir 100 mg in sodium chloride 0.9 % 100 mL IVPB       "Followed by" Linked Group Details   100 mg 200 mL/hr over 30 Minutes Intravenous Daily 03/22/20 0110 03/26/20 1913   03/22/20 2200  azithromycin (ZITHROMAX) 500 mg in sodium chloride 0.9 % 250 mL IVPB  Status:  Discontinued        500 mg 250 mL/hr over 60 Minutes Intravenous Every 24 hours 03/22/20 0111  03/23/20 0806   03/22/20 2200  cefTRIAXone (ROCEPHIN) 1 g in sodium chloride 0.9 % 100 mL IVPB  Status:  Discontinued        1 g 200 mL/hr over 30 Minutes Intravenous Every 24 hours 03/22/20 0111 03/25/20 1233   03/22/20 0200  remdesivir 200 mg in sodium chloride 0.9% 250 mL IVPB       "Followed by" Linked Group Details   200 mg 580 mL/hr over 30 Minutes Intravenous Once 03/22/20 0110 03/22/20 1900   03/22/20 0015  cefTRIAXone (ROCEPHIN) 1 g in sodium chloride 0.9 % 100 mL IVPB        1 g 200 mL/hr over 30 Minutes Intravenous  Once 03/22/20 0010 03/22/20 0103   03/21/20 2300  cefTRIAXone (ROCEPHIN) injection 1 g  Status:  Discontinued        1 g Intramuscular  Once 03/21/20 2253 03/22/20 0010   03/21/20 2300  azithromycin (ZITHROMAX) 500 mg in sodium chloride 0.9 % 250 mL IVPB        500 mg 250 mL/hr over 60 Minutes Intravenous  Once 03/21/20 2253 03/22/20 0129      Inpatient Medications  Scheduled Meds: . vitamin C  500 mg Oral Daily  . Chlorhexidine Gluconate Cloth  6 each Topical Daily  . famotidine  20 mg Oral BID  . feeding supplement (GLUCERNA SHAKE)  237 mL Oral TID BM  . insulin aspart  0-15 Units Subcutaneous TID WC  . insulin aspart  0-5 Units Subcutaneous QHS  . levothyroxine  100 mcg Oral Q0600  . linagliptin  5 mg Oral Daily  . multivitamin with minerals  1 tablet Oral Daily  . mupirocin ointment   Topical BID  . rivaroxaban  20 mg Oral Q supper  . senna-docusate  2 tablet Oral QHS  . simvastatin  10 mg Oral Daily  . sodium chloride flush  10-40 mL Intracatheter Q12H  . sodium chloride flush  3 mL Intravenous Q12H  . zinc sulfate  220 mg Oral Daily   Continuous Infusions: . sodium chloride Stopped (03/31/20 0846)  . sodium chloride     PRN Meds:.sodium chloride, acetaminophen, albuterol, bisacodyl, guaiFENesin-dextromethorphan, HYDROmorphone (DILAUDID) injection, loperamide, ondansetron **OR** ondansetron (ZOFRAN) IV, oxyCODONE, sodium chloride, sodium chloride  flush, traMADol, zolpidem   Time Spent in minutes  25  See all Orders from today for further details   Oren Binet M.D on 04/04/2020 at 11:24 AM  To page go to [email or web address] - use universal password  [facility] -  Office  [phone]    Objective:   Vitals:   04/04/20 0350 04/04/20 0351 04/04/20 0800 04/04/20 0920  BP: 117/75  119/68 131/84  Pulse: 82     Resp: 12  15 17   Temp: 98 F (36.7 C)  98 F (36.7 C)   TempSrc: Oral  Oral   SpO2: 96%  96%   Weight:  101.7 kg    Height:        Wt Readings from Last 3 Encounters:  04/04/20 101.7 kg  02/15/20 91.2 kg  08/02/19 95.3 kg     Intake/Output Summary (Last 24 hours) at 04/04/2020 1124 Last data filed at 04/04/2020 0600 Gross per 24 hour  Intake 120 ml  Output 650 ml  Net -530 ml     Physical Exam Gen Exam:Alert awake-not in any distress HEENT:atraumatic, normocephalic Chest: B/L clear to auscultation anteriorly CVS:S1S2 regular Abdomen:soft non tender, non distended Extremities:no edema Neurology: Non focal Skin: no rash   Data Review:    CBC Recent Labs  Lab 03/29/20 1614 03/30/20 0241 03/30/20 0922 03/30/20 0922 03/31/20 0437 04/01/20 0354 04/02/20 0422 04/03/20 0510 04/04/20 0238  WBC 14.6*   < > 10.2   < > 8.9 8.6 6.9 8.2 11.5*  HGB 12.4   < > 8.8*   < > 8.7* 9.0* 8.7* 9.6* 9.8*  HCT 36.7   < > 25.6*   < > 25.5* 27.2* 26.2* 28.2* 30.3*  PLT PLATELET CLUMPS NOTED ON SMEAR, UNABLE TO ESTIMATE   < > 66*   < > 68* 74* 81* 99* 114*  MCV 86.6   < > 85.0   < > 87.6 90.1 89.7 91.0 92.1  MCH 29.2   < > 29.2   < > 29.9 29.8 29.8 31.0 29.8  MCHC 33.8   < > 34.4   < > 34.1 33.1 33.2 34.0 32.3  RDW 15.3   < > 17.0*   < > 17.2* 16.5* 16.1* 16.5* 16.7*  LYMPHSABS 1.4  --  1.1  --   --   --   --   --   --   MONOABS 1.0  --  0.6  --   --   --   --   --   --   EOSABS 0.0  --  0.0  --   --   --   --   --   --   BASOSABS 0.0  --  0.0  --   --   --   --   --   --    < > = values in this  interval not displayed.    Chemistries  Recent Labs  Lab 03/29/20 0615 03/29/20 0949 03/29/20 1425 03/29/20 1614 03/30/20 0241 03/30/20 0922 03/31/20 0437  NA 137   < > 138 137 135 136 136  K 5.2*   < >  3.8 4.1 4.7 4.0 3.9  CL 105  --   --  108 106 107 108  CO2 26  --   --  19* 21* 22 23  GLUCOSE 182*  --   --  298* 211* 173* 135*  BUN 22*  --   --  18 21* 15 9  CREATININE 0.87  --   --  0.63 0.87 0.70 0.60  CALCIUM 8.8*  --   --  7.7* 7.7* 7.6* 7.9*  AST 31  --   --   --  16  --  15  ALT 35  --   --   --  22  --  22  ALKPHOS 82  --   --   --  40  --  47  BILITOT 1.6*  --   --   --  1.1  --  1.1   < > = values in this interval not displayed.   ------------------------------------------------------------------------------------------------------------------ No results for input(s): CHOL, HDL, LDLCALC, TRIG, CHOLHDL, LDLDIRECT in the last 72 hours.  Lab Results  Component Value Date   HGBA1C 8.6 (H) 03/23/2020   ------------------------------------------------------------------------------------------------------------------ No results for input(s): TSH, T4TOTAL, T3FREE, THYROIDAB in the last 72 hours.  Invalid input(s): FREET3 ------------------------------------------------------------------------------------------------------------------ No results for input(s): VITAMINB12, FOLATE, FERRITIN, TIBC, IRON, RETICCTPCT in the last 72 hours.  Coagulation profile No results for input(s): INR, PROTIME in the last 168 hours.  No results for input(s): DDIMER in the last 72 hours.  Cardiac Enzymes No results for input(s): CKMB, TROPONINI, MYOGLOBIN in the last 168 hours.  Invalid input(s): CK ------------------------------------------------------------------------------------------------------------------    Component Value Date/Time   BNP 29.9 03/21/2020 2148    Micro Results No results found for this or any previous visit (from the past 240 hour(s)).  Radiology  Reports CT Angio Chest PE W and/or Wo Contrast  Result Date: 03/22/2020 CLINICAL DATA:  Severe back pain hypotension and shallow EXAM: CT ANGIOGRAPHY CHEST WITH CONTRAST TECHNIQUE: Multidetector CT imaging of the chest was performed using the standard protocol during bolus administration of intravenous contrast. Multiplanar CT image reconstructions and MIPs were obtained to evaluate the vascular anatomy. CONTRAST:  57mL OMNIPAQUE IOHEXOL 350 MG/ML SOLN COMPARISON:  None. FINDINGS: Cardiovascular: There is a optimal opacification of the pulmonary arteries. There is no central,segmental, or subsegmental filling defects within the pulmonary arteries. The heart is normal in size. No pericardial effusion or thickening. No evidence right heart strain. There is normal three-vessel brachiocephalic anatomy without proximal stenosis. The thoracic aorta is normal in appearance. Mediastinum/Nodes: No hilar, mediastinal, or axillary adenopathy. Thyroid gland, trachea, and esophagus demonstrate no significant findings. Lungs/Pleura: Multifocal patchy ground-glass opacities are seen predominantly within the periphery of both lungs. No pleural effusion or pneumothorax is seen. Upper Abdomen: No acute abnormalities present in the visualized portions of the upper abdomen. Musculoskeletal: No chest wall abnormality. No acute or significant osseous findings. Review of the MIP images confirms the above findings. Abdomen/pelvis: Hepatobiliary: There is diffuse low density seen throughout the liver parenchyma. The main portal vein is patent. No evidence of calcified gallstones, gallbladder wall thickening or biliary dilatation. Pancreas: Unremarkable. No pancreatic ductal dilatation or surrounding inflammatory changes. Spleen: Normal in size without focal abnormality. Adrenals/Urinary Tract: Both adrenal glands appear normal. The kidneys and collecting system appear normal without evidence of urinary tract calculus or hydronephrosis.  Bladder is unremarkable. Stomach/Bowel: The stomach, small bowel, and colon are normal in appearance. No inflammatory changes, wall thickening, or obstructive findings. There appears to be  a surgical anastomosis at the sigmoid rectal junction. Mild presacral soft tissue density is again identified. Vascular/Lymphatic: There are no enlarged mesenteric, retroperitoneal, or pelvic lymph nodes. An infrarenal IVC filter is seen at approximately the L3 level. There is scattered aortic and bi-iliac calcifications noted. Reproductive: The uterus and adnexa are unremarkable. Other: No evidence of abdominal wall mass or hernia. Musculoskeletal: No acute or significant osseous findings. IMPRESSION: No central, segmental, or subsegmental pulmonary embolism. Multifocal patchy airspace opacities throughout both lungs, likely consistent with multifocal pneumonia. Hepatic steatosis Aortic Atherosclerosis (ICD10-I70.0). Electronically Signed   By: Prudencio Pair M.D.   On: 03/22/2020 00:07   MR THORACIC SPINE W WO CONTRAST  Result Date: 03/22/2020 CLINICAL DATA:  Bilateral lower extremity pain and numbness with urinary retention. Diabetes. History of colon cancer. EXAM: MRI THORACIC WITHOUT AND WITH CONTRAST TECHNIQUE: Multiplanar and multiecho pulse sequences of the thoracic spine were obtained without and with intravenous contrast. CONTRAST:  9.45mL GADAVIST GADOBUTROL 1 MMOL/ML IV SOLN COMPARISON:  CT chest done yesterday. FINDINGS: MRI THORACIC SPINE FINDINGS Alignment:  Normal Vertebrae: Normal. No fracture or primary bone lesion. No metastatic disease. Insignificant small hemangioma within the T11 vertebral body. Cord: No primary cord pathology. See below regarding stenosis at T7-8. Paraspinal and other soft tissues: Negative Disc levels: No abnormality at T5-6 or above. T6-7: Small right paracentral disc protrusion indents the thecal sac slightly but does not cause apparent neural compression. T7-8: Left posterolateral disc  herniation effaces the ventral subarachnoid space and indents the left side of the cord. This could be symptomatic. T8-9 through T12-L1: Normal. 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 Ample subarachnoid space is present dorsal to the cord at this level. None the less, this could possibly be symptomatic. Small right paracentral disc herniation at T6-7 without apparent neural compression. Electronically Signed   By: Nelson Chimes M.D.   On: 03/22/2020 12:32   MR Lumbar Spine W Wo Contrast  Result Date: 03/22/2020 CLINICAL DATA:  Back pain.  Urinary retention.  Diabetes. EXAM: MRI LUMBAR SPINE WITHOUT AND WITH CONTRAST TECHNIQUE: Multiplanar and multiecho pulse sequences of the lumbar spine were obtained without and with intravenous contrast. CONTRAST:  9.71mL GADAVIST GADOBUTROL 1 MMOL/ML IV SOLN COMPARISON:  None. FINDINGS: Segmentation:  5 lumbar type vertebral bodies. Alignment:  Slightly exaggerated lower lumbar lordosis. Vertebrae:  No fracture or primary bone lesion. Conus medullaris and cauda equina: Conus extends to the L1 level. Conus and cauda equina appear normal. Paraspinal and other soft tissues: There appears be thrombosis of the IVC and both iliac veins up to the level of the IVC filter. Flow is restored in the IVC by inflow from the renal veins. Disc levels: No abnormality from L1-2 through L2-3. L3-4: No disc abnormality.  Mild facet hypertrophy.  No stenosis. L4-5: No disc abnormality. Mild to moderate facet hypertrophy. No stenosis. L5-S1: Minimal disc bulge.  Mild facet hypertrophy.  No stenosis. IMPRESSION: No significant disc pathology. No stenosis or neural compression in the lumbar region. Patient has exaggerated lumbar lordosis. 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 Flow is restored in the IVC above that by inflow from the renal veins. I am in the process  of calling this report. Electronically Signed   By: Nelson Chimes M.D.   On: 03/22/2020 12:46   CT ABDOMEN PELVIS W CONTRAST  Result Date: 03/22/2020 CLINICAL DATA:  Severe back pain hypotension and shallow EXAM: CT ANGIOGRAPHY CHEST WITH CONTRAST TECHNIQUE: Multidetector CT imaging of the chest was performed using the standard protocol during bolus administration of intravenous contrast. Multiplanar CT image reconstructions and MIPs were obtained to evaluate the vascular anatomy. CONTRAST:  20mL OMNIPAQUE IOHEXOL 350 MG/ML SOLN COMPARISON:  None. FINDINGS: Cardiovascular: There is a optimal opacification of the pulmonary arteries. There is no central,segmental, or subsegmental filling defects within the pulmonary arteries. The heart is normal in size. No pericardial effusion or thickening. No evidence right heart strain. There is normal three-vessel brachiocephalic anatomy without proximal stenosis. The thoracic aorta is normal in appearance. Mediastinum/Nodes: No hilar, mediastinal, or axillary adenopathy. Thyroid gland, trachea, and esophagus demonstrate no significant findings. Lungs/Pleura: Multifocal patchy ground-glass opacities are seen predominantly within the periphery of both lungs. No pleural effusion or pneumothorax is seen. Upper Abdomen: No acute abnormalities present in the visualized portions of the upper abdomen. Musculoskeletal: No chest wall abnormality. No acute or significant osseous findings. Review of the MIP images confirms the above findings. Abdomen/pelvis: Hepatobiliary: There is diffuse low density seen throughout the liver parenchyma. The main portal vein is patent. No evidence of calcified gallstones, gallbladder wall thickening or biliary dilatation. Pancreas: Unremarkable. No pancreatic ductal dilatation or  surrounding inflammatory changes. Spleen: Normal in size without focal abnormality. Adrenals/Urinary Tract: Both adrenal glands appear normal. The kidneys and collecting system appear normal without evidence of urinary tract calculus or hydronephrosis. Bladder is unremarkable. Stomach/Bowel: The stomach, small bowel, and colon are normal in appearance. No inflammatory changes, wall thickening, or obstructive findings. There appears to be a surgical anastomosis at the sigmoid rectal junction. Mild presacral soft tissue density is again identified. Vascular/Lymphatic: There are no enlarged mesenteric, retroperitoneal, or pelvic lymph nodes. An infrarenal IVC filter is seen at approximately the L3 level. There is scattered aortic and bi-iliac calcifications noted. Reproductive: The uterus and adnexa are unremarkable. Other: No evidence of abdominal wall mass or hernia. Musculoskeletal: No acute or significant osseous findings. IMPRESSION: No central, segmental, or subsegmental pulmonary embolism. Multifocal patchy airspace opacities throughout both lungs, likely consistent with multifocal pneumonia. Hepatic steatosis Aortic Atherosclerosis (ICD10-I70.0). Electronically Signed   By: Prudencio Pair M.D.   On: 03/22/2020 00:07   DG Chest Port 1 View  Result Date: 03/21/2020 CLINICAL DATA:  54 year old female with weakness and hypotension. EXAM: PORTABLE CHEST 1 VIEW COMPARISON:  Chest radiograph dated 03/25/2016 FINDINGS: Faint bilateral peripheral and subpleural densities, right greater left concerning for pneumonia, possibly atypical or viral in etiology including COVID-19. Clinical correlation is recommended. No focal consolidation, pleural effusion, pneumothorax. The cardiac silhouette is within limits. No acute osseous pathology. IMPRESSION: Findings concerning for pneumonia. Electronically Signed   By: Anner Crete M.D.   On: 03/21/2020 22:17   VAS [place] LOWER EXTREMITY VENOUS (DVT)  Result Date: 03/22/2020   Lower Venous DVTStudy Indications: Hx DVT and IVC filter, now with COVID and elevated D dimer.  Comparison Study: 06/07/15 negative Performing Technologist: June Leap RDMS, RVT  Examination Guidelines: A complete evaluation includes B-mode imaging, spectral Doppler, color Doppler, and power Doppler as needed of all accessible portions of each vessel. Bilateral testing is considered an integral part of a complete examination. Limited examinations for reoccurring indications may be performed as noted. The reflux portion of the exam  is performed with the patient in reverse Trendelenburg.  +---------+---------------+---------+-----------+----------+-----------------+ RIGHT    CompressibilityPhasicitySpontaneityPropertiesThrombus Aging    +---------+---------------+---------+-----------+----------+-----------------+ CFV      None           No       No                   Acute             +---------+---------------+---------+-----------+----------+-----------------+ SFJ      None                                         Acute             +---------+---------------+---------+-----------+----------+-----------------+ FV Prox  None                                         Acute             +---------+---------------+---------+-----------+----------+-----------------+ FV Mid   None                                         Acute             +---------+---------------+---------+-----------+----------+-----------------+ FV DistalNone                                         Acute             +---------+---------------+---------+-----------+----------+-----------------+ PFV      None                                         Acute             +---------+---------------+---------+-----------+----------+-----------------+ POP      None           No       No                   Acute             +---------+---------------+---------+-----------+----------+-----------------+ PTV       None                                         Acute             +---------+---------------+---------+-----------+----------+-----------------+ PERO     None                                         Acute             +---------+---------------+---------+-----------+----------+-----------------+ Gastroc  None                                         Acute             +---------+---------------+---------+-----------+----------+-----------------+  GSV      None                                         Acute             +---------+---------------+---------+-----------+----------+-----------------+ Ex Iliac                No       No                   Age Indeterminate +---------+---------------+---------+-----------+----------+-----------------+ Com Iliac                                             Not visualized    +---------+---------------+---------+-----------+----------+-----------------+   +---------+---------------+---------+-----------+----------+-----------------+ LEFT     CompressibilityPhasicitySpontaneityPropertiesThrombus Aging    +---------+---------------+---------+-----------+----------+-----------------+ CFV      None           No       No                   Acute             +---------+---------------+---------+-----------+----------+-----------------+ SFJ      None                                         Acute             +---------+---------------+---------+-----------+----------+-----------------+ FV Prox  None                                         Acute             +---------+---------------+---------+-----------+----------+-----------------+ FV Mid   None                                         Acute             +---------+---------------+---------+-----------+----------+-----------------+ FV DistalNone                                         Acute              +---------+---------------+---------+-----------+----------+-----------------+ PFV      None                                         Acute             +---------+---------------+---------+-----------+----------+-----------------+ POP      None           No       No                   Acute             +---------+---------------+---------+-----------+----------+-----------------+ PTV      None  Acute             +---------+---------------+---------+-----------+----------+-----------------+ PERO     None                                         Acute             +---------+---------------+---------+-----------+----------+-----------------+ Gastroc  None                                         Acute             +---------+---------------+---------+-----------+----------+-----------------+ GSV      None                                         Acute             +---------+---------------+---------+-----------+----------+-----------------+ Ex Iliac                No       No                   Age Indeterminate +---------+---------------+---------+-----------+----------+-----------------+ Com Iliac                                             Not visualized    +---------+---------------+---------+-----------+----------+-----------------+     Summary: RIGHT: - Findings consistent with acute deep vein thrombosis involving the right common femoral vein, SF junction, right femoral vein, right proximal profunda vein, right popliteal vein, right posterior tibial veins, right peroneal veins, and right gastrocnemius veins. - Findings consistent with acute superficial vein thrombosis involving the right great saphenous vein. - Thrombosis extends into the Right Iliac vein and IVC up to the level of the filter placement.  LEFT: - Findings consistent with acute deep vein thrombosis involving the left common femoral vein, SF junction, left  femoral vein, left proximal profunda vein, left popliteal vein, left posterior tibial veins, left peroneal veins, and left gastrocnemius veins. - Findings consistent with acute superficial vein thrombosis involving the left great saphenous vein. - Thrombosis extends into the Left Iliac vein.  *See table(s) above for measurements and observations. Electronically signed by Curt Jews MD on 03/22/2020 at 5:19:17 PM.    Final    HYBRID OR IMAGING ([facility])  Result Date: 03/29/2020 There is no interpretation for this exam.  This order is for images obtained during a surgical procedure.  Please See "Surgeries" Tab for more information regarding the procedure.   HYBRID OR IMAGING ([facility] ONLY)  Result Date: 03/28/2020 There is no interpretation for this exam.  This order is for images obtained during a surgical procedure.  Please See "Surgeries" Tab for more information regarding the procedure.

## 2020-04-04 NOTE — Progress Notes (Addendum)
Progress Note    04/04/2020 7:03 AM 6 Days Post-Op  Subjective: Continues to complain of early satiety, nausea, bloating and belching. Mild abdominal pain.   Vitals:   04/04/20 0102 04/04/20 0350  BP: 122/75 117/75  Pulse:  82  Resp: 15 12  Temp: 98.3 F (36.8 C) 98 F (36.7 C)  SpO2: 94% 96%  Tm 99  Physical Exam: Cardiac: Rate and rhythm are regular Lungs: Clear to auscultation bilaterally Incisions: Bilateral neck puncture sites healing without signs of infection Extremities: Compression stockings on bilateral lower extremities. No apparent edema. No pain with dorsiflexion. Abdomen: Mild distention and tympany noted. Normoactive bowel sounds. No significant tenderness to palpation  CBC    Component Value Date/Time   WBC 11.5 (H) 04/04/2020 0238   RBC 3.29 (L) 04/04/2020 0238   HGB 9.8 (L) 04/04/2020 0238   HGB 13.3 12/17/2013 1538   HCT 30.3 (L) 04/04/2020 0238   HCT 40.6 12/17/2013 1538   PLT 114 (L) 04/04/2020 0238   PLT 219 12/17/2013 1538   MCV 92.1 04/04/2020 0238   MCV 90.3 12/17/2013 1538   MCH 29.8 04/04/2020 0238   MCHC 32.3 04/04/2020 0238   RDW 16.7 (H) 04/04/2020 0238   RDW 13.5 12/17/2013 1538   LYMPHSABS 1.1 03/30/2020 0922   LYMPHSABS 1.4 12/17/2013 1538   MONOABS 0.6 03/30/2020 0922   MONOABS 0.3 12/17/2013 1538   EOSABS 0.0 03/30/2020 0922   EOSABS 0.0 12/17/2013 1538   BASOSABS 0.0 03/30/2020 0922   BASOSABS 0.0 12/17/2013 1538    BMET    Component Value Date/Time   NA 136 03/31/2020 0437   NA 144 01/21/2018 1000   NA 142 12/17/2013 1538   K 3.9 03/31/2020 0437   K 4.1 12/17/2013 1538   CL 108 03/31/2020 0437   CL 106 12/07/2012 0838   CO2 23 03/31/2020 0437   CO2 28 12/17/2013 1538   GLUCOSE 135 (H) 03/31/2020 0437   GLUCOSE 123 12/17/2013 1538   GLUCOSE 119 (H) 12/07/2012 0838   BUN 9 03/31/2020 0437   BUN 12 01/21/2018 1000   BUN 14.1 12/17/2013 1538   CREATININE 0.60 03/31/2020 0437   CREATININE 0.96 10/27/2015 0904     CREATININE 0.8 12/17/2013 1538   CALCIUM 7.9 (L) 03/31/2020 0437   CALCIUM 9.7 12/17/2013 1538   GFRNONAA >60 03/31/2020 0437   GFRAA >60 03/31/2020 0437     Intake/Output Summary (Last 24 hours) at 04/04/2020 0703 Last data filed at 04/04/2020 0600 Gross per 24 hour  Intake 120 ml  Output 1300 ml  Net -1180 ml    HOSPITAL MEDICATIONS Scheduled Meds: . vitamin C  500 mg Oral Daily  . Chlorhexidine Gluconate Cloth  6 each Topical Daily  . famotidine  20 mg Oral BID  . feeding supplement (GLUCERNA SHAKE)  237 mL Oral TID BM  . insulin aspart  0-15 Units Subcutaneous TID WC  . insulin aspart  0-5 Units Subcutaneous QHS  . levothyroxine  100 mcg Oral Q0600  . linagliptin  5 mg Oral Daily  . multivitamin with minerals  1 tablet Oral Daily  . rivaroxaban  20 mg Oral Q supper  . senna-docusate  2 tablet Oral QHS  . simvastatin  10 mg Oral Daily  . sodium chloride flush  10-40 mL Intracatheter Q12H  . sodium chloride flush  3 mL Intravenous Q12H  . zinc sulfate  220 mg Oral Daily   Continuous Infusions: . sodium chloride Stopped (03/31/20 0846)  . sodium  chloride     PRN Meds:.sodium chloride, acetaminophen, albuterol, bisacodyl, guaiFENesin-dextromethorphan, HYDROmorphone (DILAUDID) injection, loperamide, ondansetron **OR** ondansetron (ZOFRAN) IV, oxyCODONE, sodium chloride, sodium chloride flush, traMADol, zolpidem  Assessment:s/p angiovac, IVC subsequent RP bleed. Hemoglobin stable. Mild leukocytosis this morning. Maximal temperature 99 degrees. Good urine output. Ileus without vomiting   Plan: -Suspect her bowel function will improve with improved mobility. Now on Xarelto. Continue compression stockings and elevation.     Risa Grill, PA-C [facility] [phone] 04/04/2020  7:03 AM   I have independently interviewed and examined patient and agree with PA assessment and plan above.  Outer compression legs appear much improved today.  Should  have compression on when walking.  Continue diet as tolerated.  Tylene Quashie C. Donzetta Matters, MD [facility] [place] Office: [phone] Pager: [phone]

## 2020-04-04 NOTE — Discharge Instructions (Addendum)
Follow with Primary MD Glendale Chard, MD in 7 days   Get CBC, CMP, Magnesium  checked next visit within 1 week by Primary MD    Activity: As tolerated with Full fall precautions use walker/cane & assistance as needed  Disposition Home   Diet: Soft diet for 2 to 3 days then advance gradually to regular consistency heart healthy Low Carb diet as tolerated.   Special Instructions: If you have smoked or chewed Tobacco  in the last 2 yrs please stop smoking, stop any regular Alcohol  and or any Recreational drug use.  On your next visit with your primary care physician please Get Medicines reviewed and adjusted.  Please request your Prim.MD to go over all Hospital Tests and Procedure/Radiological results at the follow up, please get all Hospital records sent to your Prim MD by signing hospital release before you go home.  If you experience worsening of your admission symptoms, develop shortness of breath, life threatening emergency, suicidal or homicidal thoughts you must seek medical attention immediately by calling 911 or calling your MD immediately  if symptoms less severe.  You Must read complete instructions/literature along with all the possible adverse reactions/side effects for all the Medicines you take and that have been prescribed to you. Take any new Medicines after you have completely understood and accpet all the possible adverse reactions/side effects.       Information on my medicine - ELIQUIS (apixaban)  Why was Eliquis prescribed for you? Eliquis was prescribed for you to reduce the risk of forming blood clots that can cause a stroke if you have a medical condition called atrial fibrillation (a type of irregular heartbeat) OR to reduce the risk of a blood clots forming after orthopedic surgery.  What do You need to know about Eliquis ? Take your Eliquis TWICE DAILY - one tablet in the morning and one tablet in the evening with or without food.  It would be best to take  the doses about the same time each day.  If you have difficulty swallowing the tablet whole please discuss with your pharmacist how to take the medication safely.  Take Eliquis exactly as prescribed by your doctor and DO NOT stop taking Eliquis without talking to the doctor who prescribed the medication.  Stopping may increase your risk of developing a new clot or stroke.  Refill your prescription before you run out.  After discharge, you should have regular check-up appointments with your healthcare provider that is prescribing your Eliquis.  In the future your dose may need to be changed if your kidney function or weight changes by a significant amount or as you get older.  What do you do if you miss a dose? If you miss a dose, take it as soon as you remember on the same day and resume taking twice daily.  Do not take more than one dose of ELIQUIS at the same time.  Important Safety Information A possible side effect of Eliquis is bleeding. You should call your healthcare provider right away if you experience any of the following: ? Bleeding from an injury or your nose that does not stop. ? Unusual colored urine (red or dark brown) or unusual colored stools (red or black). ? Unusual bruising for unknown reasons. ? A serious fall or if you hit your head (even if there is no bleeding).  Some medicines may interact with Eliquis and might increase your risk of bleeding or clotting while on Eliquis. To help avoid this,  consult your healthcare provider or pharmacist prior to using any new prescription or non-prescription medications, including herbals, vitamins, non-steroidal anti-inflammatory drugs (NSAIDs) and supplements.  This website has more information on Eliquis (apixaban): [email or web address].

## 2020-04-04 NOTE — Progress Notes (Signed)
Physical Therapy Treatment Patient Details Name: [name] MRN: [id number] DOB: February 18, 1966 Today's Date: 04/04/2020    History of Present Illness Patient is a 53 y.o. female with PMHx of DM-2, HLD, hypothyroidism, PE-s/p IVC filter in 2012, history of colon cancer-diagnosed with COVID-19 on 5/21-presented with fatigue/malaise, low back pain-found to have patchy airspace disease on CTA chest, MRI thoracic/lumbar spine showed T7 disc herniation-but also showed extensive clot in the IVC.  s/p bilateral popliteal lytic catheter placement on 6/8-and mechanical thrombectomy/IVC filter on 6/9    PT Comments    Pt progressing slowly with mobility, ambulating x2 room distance with use of RW and increased time/breaks due to nausea, tachycardia, and pain. Pt is requiring min assist overall for mobility at this time, and at baseline is very independent. PT continuing to recommend CIR to address mobility deficits, will continue to follow acutely.    Follow Up Recommendations  CIR     Equipment Recommendations  Other (comment) (defer)    Recommendations for Other Services       Precautions / Restrictions Precautions Precautions: Fall Restrictions Weight Bearing Restrictions: No    Mobility  Bed Mobility Overal bed mobility: Needs Assistance Bed Mobility: Supine to Sit     Supine to sit: Min guard     General bed mobility comments: for safety, required assist to don/doff LE compression stockings and socks in supine  Transfers Overall transfer level: Needs assistance Equipment used: Rolling walker (2 wheeled) Transfers: Sit to/from [facility] Sit to Stand: Min assist Stand pivot transfers: Min assist;From elevated surface       General transfer comment: Min assist for power up, steadying, verbal cuing for hand placement when rising. Sit to stand x3, from EOB x2 and from [facility] x1. Min assist for stand pivot for steadying, guiding pt trajectory to [facility], slow eccentric  lowering.  Ambulation/Gait Ambulation/Gait assistance: Min assist;Min guard Gait Distance (Feet): 25 Feet (+10 to go to sink to wash up with NT) Assistive device: Rolling walker (2 wheeled) Gait Pattern/deviations: Step-through pattern;Decreased stride length;Trunk flexed Gait velocity: decr   General Gait Details: Min guard for safety, with occasional min assist to steady. Verbal cuing for upright posture, placement in RW, and room navigation. Pt limited by nausea, abdominal pain, and tachycardia up to 121 bpm.   Stairs             Wheelchair Mobility    Modified Rankin (Stroke Patients Only)       Balance Overall balance assessment: Needs assistance Sitting-balance support: No upper extremity supported;Feet supported Sitting balance-Leahy Scale: Fair     Standing balance support: No upper extremity supported Standing balance-Leahy Scale: Fair Standing balance comment: able to stand statically without external support, requires use of RW for dynamic tasks                            Cognition Arousal/Alertness: Awake/alert Behavior During Therapy: WFL for tasks assessed/performed Overall Cognitive Status: Within Functional Limits for tasks assessed                                        Exercises      General Comments        Pertinent Vitals/Pain Pain Assessment: 0-10 Pain Score: 5  Pain Location: abdomen, nausea Pain Descriptors / Indicators: Sore;Discomfort Pain Intervention(s): Limited activity within patient's tolerance;Monitored  during session;Repositioned    Home Living                      Prior Function            PT Goals (current goals can now be found in the care plan section) Acute Rehab PT Goals Patient Stated Goal: Get well and go home as soon as possible PT Goal Formulation: With patient Time For Goal Achievement: 04/15/20 Potential to Achieve Goals: Good Progress towards PT goals: Progressing  toward goals    Frequency    Min 3X/week      PT Plan Current plan remains appropriate    Co-evaluation              AM-PAC PT "6 Clicks" Mobility   Outcome Measure  Help needed turning from your back to your side while in a flat bed without using bedrails?: A Little Help needed moving from lying on your back to sitting on the side of a flat bed without using bedrails?: A Little Help needed moving to and from a bed to a chair (including a wheelchair)?: A Little Help needed standing up from a chair using your arms (e.g., wheelchair or bedside chair)?: A Little Help needed to walk in hospital room?: A Little Help needed climbing 3-5 steps with a railing? : A Lot 6 Click Score: 17    End of Session Equipment Utilized During Treatment: Other (comment) (TEDs) Activity Tolerance: Patient tolerated treatment well Patient left: in chair;with nursing/sitter in room (washing up at sink with assist of NT) Nurse Communication: Mobility status (spoke with NT) PT Visit Diagnosis: Muscle weakness (generalized) (M62.81);Difficulty in walking, not elsewhere classified (R26.2)     Time: 3832-9191 PT Time Calculation (min) (ACUTE ONLY): 28 min  Charges:  $Gait Training: 8-22 mins $Therapeutic Activity: 8-22 mins                     Layton Naves E, PT Acute Rehabilitation Services Pager [phone]  Office [phone]    Levent Kornegay D Mariadelaluz [place] 04/04/2020, 10:35 AM

## 2020-04-05 LAB — CBC
HCT: 30.8 % — ABNORMAL LOW (ref 36.0–46.0)
Hemoglobin: 10 g/dL — ABNORMAL LOW (ref 12.0–15.0)
MCH: 30.1 pg (ref 26.0–34.0)
MCHC: 32.5 g/dL (ref 30.0–36.0)
MCV: 92.8 fL (ref 80.0–100.0)
Platelets: 150 10*3/uL (ref 150–400)
RBC: 3.32 MIL/uL — ABNORMAL LOW (ref 3.87–5.11)
RDW: 17 % — ABNORMAL HIGH (ref 11.5–15.5)
WBC: 12.5 10*3/uL — ABNORMAL HIGH (ref 4.0–10.5)
nRBC: 0 % (ref 0.0–0.2)

## 2020-04-05 LAB — GLUCOSE, CAPILLARY
Glucose-Capillary: 120 mg/dL — ABNORMAL HIGH (ref 70–99)
Glucose-Capillary: 130 mg/dL — ABNORMAL HIGH (ref 70–99)
Glucose-Capillary: 103 mg/dL — ABNORMAL HIGH (ref 70–99)
Glucose-Capillary: 113 mg/dL — ABNORMAL HIGH (ref 70–99)

## 2020-04-05 MED ORDER — PANTOPRAZOLE SODIUM 40 MG PO TBEC
40.0000 mg | DELAYED_RELEASE_TABLET | Freq: Every day | ORAL | Status: DC
  Administered 2020-04-05: 40 mg via ORAL

## 2020-04-05 MED ORDER — SODIUM CHLORIDE 0.9 % IV SOLN: INTRAVENOUS | Status: AC

## 2020-04-05 MED ORDER — OXYCODONE HCL 5 MG PO TABS
5.0000 mg | ORAL_TABLET | ORAL | Status: DC | PRN
  Administered 2020-04-05 – 2020-04-06 (×2): 5 mg via ORAL
  Filled 2020-04-05 (×2): qty 1

## 2020-04-05 MED ORDER — PANTOPRAZOLE SODIUM 40 MG PO TBEC
40.0000 mg | DELAYED_RELEASE_TABLET | Freq: Two times a day (BID) | ORAL | Status: DC
  Administered 2020-04-05 – 2020-04-06 (×2): 40 mg via ORAL
  Filled 2020-04-05 (×2): qty 1

## 2020-04-05 MED ORDER — POLYETHYLENE GLYCOL 3350 17 G PO PACK
17.0000 g | PACK | Freq: Four times a day (QID) | ORAL | Status: AC
  Administered 2020-04-05: 17 g via ORAL
  Filled 2020-04-05: qty 1

## 2020-04-05 NOTE — Progress Notes (Signed)
Transitions of Care Pharmacist Note  [name] is a 54 y.o. female that has been diagnosed with DVT and will be prescribed Xarelto (rivaroxaban) at discharge.   Patient Education: I provided the following education on Xarelto to the patient: How to take the medication Described what the medication is Signs of bleeding Signs/symptoms of VTE and stroke  Answered their questions  Discharge Medications Plan: The patient wants to have their discharge medications filled by the Transitions of Care pharmacy rather than their usual pharmacy.  The discharge orders pharmacy has been changed to the Transitions of Care pharmacy, the patient will receive a phone call regarding co-pay, and their medications will be delivered by the Transitions of Care pharmacy.    Thank you,   Sherren Kerns, PharmD PGY1 Acute Care Pharmacy Resident April 05, 2020

## 2020-04-05 NOTE — Progress Notes (Signed)
Inpatient Rehabilitation-Admissions Coordinator   I have insurance approval for CIR however I do not have a bed opening for this patient today. Will follow up tomorrow for possible admit, pending bed availability.   Raechel Ache, OTR/L  Rehab Admissions Coordinator  [phone] 04/05/2020 10:59 AM

## 2020-04-05 NOTE — Progress Notes (Signed)
Physical Therapy Treatment Patient Details Name: [name] MRN: [id number] DOB: May 04, 1966 Today's Date: 04/05/2020    History of Present Illness Patient is a 54 y.o. female with PMHx of DM-2, HLD, hypothyroidism, PE-s/p IVC filter in 2012, history of colon cancer-diagnosed with COVID-19 on 5/21-presented with fatigue/malaise, low back pain-found to have patchy airspace disease on CTA chest, MRI thoracic/lumbar spine showed T7 disc herniation-but also showed extensive clot in the IVC.  s/p bilateral popliteal lytic catheter placement on 6/8-and mechanical thrombectomy/IVC filter on 6/9    PT Comments    Pt was seen for mobility on RW, has nausea but can walk with wide fast turns.  Talked with pt about her fast turning and she is feeling comfortable with moving walker in a faster way.  Reinforce safety, and work on LE strength, balance and endurance to increase safety with all transfers to chair and to walk.  Husband in attendance to hear instructions for the wife.  Follow Up Recommendations  CIR     Equipment Recommendations  Other (comment)    Recommendations for Other Services Rehab consult     Precautions / Restrictions Precautions Precautions: Fall Precaution Comments: monitor O2 sats, HR and BP Restrictions Weight Bearing Restrictions: No    Mobility  Bed Mobility Overal bed mobility: Needs Assistance Bed Mobility: Supine to Sit;Sit to Supine     Supine to sit: Min guard Sit to supine: Min guard   General bed mobility comments: min guard for safety due to nausea  Transfers Overall transfer level: Needs assistance Equipment used: Rolling walker (2 wheeled) Transfers: Sit to/from Stand Sit to Stand: Min guard Stand pivot transfers: Min guard          Ambulation/Gait Ambulation/Gait assistance: Supervision;Min guard Gait Distance (Feet): 35 Feet Assistive device: Rolling walker (2 wheeled) Gait Pattern/deviations: Step-through pattern;Decreased stride  length;Trunk flexed Gait velocity: decreased Gait velocity interpretation: <1.31 ft/sec, indicative of household ambulator General Gait Details: min guard due to nausea   Stairs             Wheelchair Mobility    Modified Rankin (Stroke Patients Only)       Balance Overall balance assessment: Needs assistance Sitting-balance support: Feet supported Sitting balance-Leahy Scale: Good     Standing balance support: Bilateral upper extremity supported;During functional activity Standing balance-Leahy Scale: Fair Standing balance comment: able to pivot on RW with better balance control                            Cognition Arousal/Alertness: Awake/alert Behavior During Therapy: WFL for tasks assessed/performed Overall Cognitive Status: Within Functional Limits for tasks assessed                                        Exercises      General Comments General comments (skin integrity, edema, etc.): pt is up to side of bed, waited then was able to walk with no pain issues of RLE in leg where IVC filter was removed      Pertinent Vitals/Pain Pain Assessment: Faces Faces Pain Scale: Hurts little more Pain Location: leg and abd Pain Descriptors / Indicators: Guarding Pain Intervention(s): Monitored during session;Repositioned    Home Living                      Prior Function  PT Goals (current goals can now be found in the care plan section) Acute Rehab PT Goals Patient Stated Goal: feel better and leave for home Progress towards PT goals: Progressing toward goals    Frequency    Min 3X/week      PT Plan Current plan remains appropriate    Co-evaluation              AM-PAC PT "6 Clicks" Mobility   Outcome Measure  Help needed turning from your back to your side while in a flat bed without using bedrails?: A Little Help needed moving from lying on your back to sitting on the side of a flat bed  without using bedrails?: A Little Help needed moving to and from a bed to a chair (including a wheelchair)?: A Little Help needed standing up from a chair using your arms (e.g., wheelchair or bedside chair)?: A Little Help needed to walk in hospital room?: A Little Help needed climbing 3-5 steps with a railing? : A Lot 6 Click Score: 17    End of Session Equipment Utilized During Treatment: Gait belt Activity Tolerance: Patient limited by fatigue;Patient limited by lethargy;Treatment limited secondary to medical complications (Comment) (nausea) Patient left: in bed;with call bell/phone within reach;with bed alarm set;with family/visitor present Nurse Communication: Mobility status PT Visit Diagnosis: Muscle weakness (generalized) (M62.81);Difficulty in walking, not elsewhere classified (R26.2)     Time: 3254-9826 PT Time Calculation (min) (ACUTE ONLY): 32 min  Charges:  $Gait Training: 8-22 mins $Therapeutic Activity: 8-22 mins Ramond Dial 04/05/2020, 4:38 PM   Mee Hives, PT MS Acute Rehab Dept. Number: [place] [facility] [place]

## 2020-04-05 NOTE — Progress Notes (Signed)
Mobility Specialist: Progress Note    04/05/20 1637  Mobility  Activity Ambulated in room  Level of Assistance Modified independent, requires aide device or extra time  Assistive Device Front wheel walker  Distance Ambulated (ft) 25 ft  Mobility Response Tolerated well  Mobility performed by Mobility specialist  $Mobility charge 1 Mobility   Pre-Mobility: 84 HR, 129/74 BP, 94% SpO2 Post-Mobility: 87 HR, 95% SpO2  Pt felt nauseous throughout session.   [facility] Jermell Holeman Mobility Specialist

## 2020-04-05 NOTE — Progress Notes (Signed)
Occupational Therapy Treatment Patient Details Name: [name] MRN: [id number] DOB: Mar 19, 1966 Today's Date: 04/05/2020    History of present illness Patient is a 54 y.o. female with PMHx of DM-2, HLD, hypothyroidism, PE-s/p IVC filter in 2012, history of colon cancer-diagnosed with COVID-19 on 5/21-presented with fatigue/malaise, low back pain-found to have patchy airspace disease on CTA chest, MRI thoracic/lumbar spine showed T7 disc herniation-but also showed extensive clot in the IVC.  s/p bilateral popliteal lytic catheter placement on 6/8-and mechanical thrombectomy/IVC filter on 6/9   OT comments  Patient progressing well towards acute OT goals. Patient min G for bed mobility, min A for functional transfer to bedside commode, then chair at sink to perform seated g/h. Min cues for safety with body mechanics and use of rolling walker. Max A for LB dressing to change mesh underwear. Will continue to follow.  Follow Up Recommendations  CIR    Equipment Recommendations  Tub/shower seat;Other (comment) (to be further assessed)       Precautions / Restrictions Precautions Precautions: Fall Restrictions Weight Bearing Restrictions: No       Mobility Bed Mobility Overal bed mobility: Needs Assistance Bed Mobility: Supine to Sit;Sit to Supine     Supine to sit: Min guard Sit to supine: Min guard   General bed mobility comments: for safety  Transfers Overall transfer level: Needs assistance Equipment used: Rolling walker (2 wheeled) Transfers: Sit to/from Stand Sit to Stand: Min assist         General transfer comment: min A for steadying    Balance Overall balance assessment: Needs assistance Sitting-balance support: No upper extremity supported;Feet supported Sitting balance-Leahy Scale: Good     Standing balance support: Bilateral upper extremity supported;During functional activity Standing balance-Leahy Scale: Poor Standing balance comment: reliant on  external support                           ADL either performed or assessed with clinical judgement   ADL Overall ADL's : Needs assistance/impaired     Grooming: Oral care;Wash/dry face;Wash/dry hands;Set up;Sitting Grooming Details (indicate cue type and reason): seated at sinkside, decreased standing tolerance             Lower Body Dressing: Maximal assistance;Sitting/lateral leans;Sit to/from stand Lower Body Dressing Details (indicate cue type and reason): to don clean mesh underwear and pad Toilet Transfer: Minimal assistance;BSC;Ambulation;RW Toilet Transfer Details (indicate cue type and reason): cues for safety with body mechanics Toileting- Clothing Manipulation and Hygiene: Minimal assistance;Sit to/from stand Toileting - Clothing Manipulation Details (indicate cue type and reason): patient able to perform peri care with min A in standing for safety with balance     Functional mobility during ADLs: Minimal assistance;Rolling walker;Cueing for safety General ADL Comments: pt with weakness, decreased activity tolerance, additional limitations due to nausea with mobility today                Cognition Arousal/Alertness: Awake/alert Behavior During Therapy: WFL for tasks assessed/performed Overall Cognitive Status: Within Functional Limits for tasks assessed                                                     Pertinent Vitals/ Pain       Pain Assessment: Faces Faces Pain Scale: Hurts a little bit Pain Location: abdomen,  nausea Pain Descriptors / Indicators: Sore;Discomfort Pain Intervention(s): Monitored during session         Frequency  Min 2X/week        Progress Toward Goals  OT Goals(current goals can now be found in the care plan section)  Progress towards OT goals: Progressing toward goals  Acute Rehab OT Goals Patient Stated Goal: Get well and go home as soon as possible OT Goal Formulation: With  patient Time For Goal Achievement: 04/15/20 Potential to Achieve Goals: Good ADL Goals Pt Will Perform Grooming: with modified independence;sitting;standing Pt Will Perform Lower Body Bathing: with modified independence;sitting/lateral leans;sit to/from stand Pt Will Perform Lower Body Dressing: with modified independence;sit to/from stand;sitting/lateral leans Pt Will Transfer to Toilet: with modified independence;ambulating Pt Will Perform Toileting - Clothing Manipulation and hygiene: with modified independence;sit to/from stand;sitting/lateral leans Pt/caregiver will Perform Home Exercise Program: Increased strength;Both right and left upper extremity;With written HEP provided;With theraband  Plan Discharge plan remains appropriate       AM-PAC OT "6 Clicks" Daily Activity     Outcome Measure   Help from another person eating meals?: A Little Help from another person taking care of personal grooming?: A Little Help from another person toileting, which includes using toliet, bedpan, or urinal?: A Little Help from another person bathing (including washing, rinsing, drying)?: A Lot Help from another person to put on and taking off regular upper body clothing?: A Little Help from another person to put on and taking off regular lower body clothing?: A Lot 6 Click Score: 16    End of Session Equipment Utilized During Treatment: Rolling walker  OT Visit Diagnosis: Muscle weakness (generalized) (M62.81);Unsteadiness on feet (R26.81)   Activity Tolerance Patient tolerated treatment well   Patient Left in bed;with call bell/phone within reach   Nurse Communication Mobility status        Time: 0177-9390 OT Time Calculation (min): 26 min  Charges: OT General Charges $OT Visit: 1 Visit OT Treatments $Self Care/Home Management : 23-37 mins  Delbert Phenix OT OT office: [place] 04/05/2020, 12:00 PM

## 2020-04-05 NOTE — Progress Notes (Addendum)
Progress Note    04/05/2020 7:37 AM 7 Days Post-Op  Subjective:  Mid abdominal pain and mobility continue to improve. Intermittent nausea, no vomiting   Vitals:   04/05/20 0032 04/05/20 0500  BP: 111/83 116/78  Pulse:    Resp: 15 16  Temp: 98.9 F (37.2 C) 98.8 F (37.1 C)  SpO2: 98%     Physical Exam: Cardiac:  RRR Lungs:  CTA bilateral Extremities:  Edema nearly resolved. Abdomen:  Mild TTP mid'left abdomen. Slight distension. Active BS Urine appears concentrated. No output charted CBC    Component Value Date/Time   WBC 12.5 (H) 04/05/2020 0351   RBC 3.32 (L) 04/05/2020 0351   HGB 10.0 (L) 04/05/2020 0351   HGB 13.3 12/17/2013 1538   HCT 30.8 (L) 04/05/2020 0351   HCT 40.6 12/17/2013 1538   PLT 150 04/05/2020 0351   PLT 219 12/17/2013 1538   MCV 92.8 04/05/2020 0351   MCV 90.3 12/17/2013 1538   MCH 30.1 04/05/2020 0351   MCHC 32.5 04/05/2020 0351   RDW 17.0 (H) 04/05/2020 0351   RDW 13.5 12/17/2013 1538   LYMPHSABS 1.1 03/30/2020 0922   LYMPHSABS 1.4 12/17/2013 1538   MONOABS 0.6 03/30/2020 0922   MONOABS 0.3 12/17/2013 1538   EOSABS 0.0 03/30/2020 0922   EOSABS 0.0 12/17/2013 1538   BASOSABS 0.0 03/30/2020 0922   BASOSABS 0.0 12/17/2013 1538    BMET    Component Value Date/Time   NA 136 03/31/2020 0437   NA 144 01/21/2018 1000   NA 142 12/17/2013 1538   K 3.9 03/31/2020 0437   K 4.1 12/17/2013 1538   CL 108 03/31/2020 0437   CL 106 12/07/2012 0838   CO2 23 03/31/2020 0437   CO2 28 12/17/2013 1538   GLUCOSE 135 (H) 03/31/2020 0437   GLUCOSE 123 12/17/2013 1538   GLUCOSE 119 (H) 12/07/2012 0838   BUN 9 03/31/2020 0437   BUN 12 01/21/2018 1000   BUN 14.1 12/17/2013 1538   CREATININE 0.60 03/31/2020 0437   CREATININE 0.96 10/27/2015 0904   CREATININE 0.8 12/17/2013 1538   CALCIUM 7.9 (L) 03/31/2020 0437   CALCIUM 9.7 12/17/2013 1538   GFRNONAA >60 03/31/2020 0437   GFRAA >60 03/31/2020 0437     Intake/Output Summary (Last 24 hours) at  04/05/2020 0737 Last data filed at 04/04/2020 2100 Gross per 24 hour  Intake 120 ml  Output --  Net 120 ml    HOSPITAL MEDICATIONS Scheduled Meds: . vitamin C  500 mg Oral Daily  . Chlorhexidine Gluconate Cloth  6 each Topical Daily  . famotidine  20 mg Oral BID  . feeding supplement (GLUCERNA SHAKE)  237 mL Oral TID BM  . insulin aspart  0-15 Units Subcutaneous TID WC  . insulin aspart  0-5 Units Subcutaneous QHS  . levothyroxine  100 mcg Oral Q0600  . linagliptin  5 mg Oral Daily  . multivitamin with minerals  1 tablet Oral Daily  . mupirocin ointment   Topical BID  . rivaroxaban  20 mg Oral Q supper  . senna-docusate  2 tablet Oral QHS  . simvastatin  10 mg Oral Daily  . sodium chloride flush  10-40 mL Intracatheter Q12H  . sodium chloride flush  3 mL Intravenous Q12H  . zinc sulfate  220 mg Oral Daily   Continuous Infusions: . sodium chloride Stopped (03/31/20 0846)  . sodium chloride     PRN Meds:.sodium chloride, acetaminophen, albuterol, bisacodyl, guaiFENesin-dextromethorphan, HYDROmorphone (DILAUDID) injection, loperamide, ondansetron **OR**  ondansetron (ZOFRAN) IV, oxyCODONE, promethazine, sodium chloride, sodium chloride flush, traMADol, zolpidem  Assessment:s/p angiovac, IVC subsequent RP bleed. Hemoglobin stable. Mild leukocytosis. Maximal temperature 99.3 degrees. Good urine output. Ileus without vomiting     Plan: -Continue mobilization. Will initiate mild IVF hydration. May remove compression stockings at nighttime, on while OOB  Risa Grill, PA-C [facility] [phone] 04/05/2020  7:37 AM   I have independently interviewed and examined patient and agree with PA assessment and plan above.   Teri Legacy C. Donzetta Matters, MD [facility] [place] Office: [phone] Pager: [phone]

## 2020-04-05 NOTE — Progress Notes (Signed)
Nutrition Follow up  DOCUMENTATION CODES:   Not applicable  INTERVENTION:    Glucerna Shake po TID, each supplement provides 220 kcal and 10 grams of protein  Encourage adequate PO intake.   NUTRITION DIAGNOSIS:   Increased nutrient needs related to acute illness (COVID) as evidenced by estimated needs.  Ongoing  GOAL:   Patient will meet greater than or equal to 90% of their needs   Not meeting by mouth   MONITOR:   PO intake, Supplement acceptance, Skin, Weight trends, Labs, I & O's  REASON FOR ASSESSMENT:   Malnutrition Screening Tool    ASSESSMENT:   53 y.o. female with medical history significant for type 2 diabetes, hypothyroidism, hyperlipidemia, history of PE, and history of colon cancer who presents for evaluation of new onset low back pain with lower extremity weakness and change in sensation, she was recently diagnosed with COVID-19 on 03/10/2020. Pt with Sepsis due to COVID-19 pneumonitis. MRI lumbar spine/thoracic spine is done, significant for IVC filter thrombosis.   6/8- s/p placement of bilateral popliteal lytic catheters 6/9- s/p mechanical thrombectomy/IVC filter removal   RD working remotely.  Unable to reach pt via phone. Had vomiting episode yesterday that has since resolved. Experiencing intermittent nausea. Meal completions lack documentation. Last meal charted as 75% on 6/14. Refused Glucerna per MAR. Will continue to monitor symptoms and PO intake. If intake is slow to progress consider Cortrak.   Noted new DTI on bilateral knees.   Admission weight: 94.2 kg  Current weight: 101.3 kg   Drips: NS @ 75 ml/hr  Medications: 500 mg Vit C, SS novolog, tradjenta, MVI, senokot, 220 mg zinc sulfate Labs: CBG 91-124   Diet Order:   Diet Order            Diet Carb Modified Fluid consistency: Thin; Room service appropriate? Yes  Diet effective now                 EDUCATION NEEDS:   Not appropriate for education at this time  Skin:  Skin  Assessment: Skin Integrity Issues: Skin Integrity Issues:: Incisions Incisions: bilateral neck/bilateral legs DTI- bilateral knees  Last BM:  6/13  Height:   Ht Readings from Last 1 Encounters:  03/22/20 5\' 11"  (1.803 m)    Weight:   Wt Readings from Last 1 Encounters:  04/05/20 101.3 kg    BMI:  Body mass index is 31.15 kg/m.  Estimated Nutritional Needs:   Kcal:  1900-2100  Protein:  95-110 grams  Fluid:  >/= 2 L/day  Mariana Single RD, LDN Clinical Nutrition Pager listed in [facility]

## 2020-04-05 NOTE — Progress Notes (Addendum)
PROGRESS NOTE                                                                                                                                                                                                             Patient Demographics:    [name], is a 54 y.o. female, DOB - 23-Feb-1966, TMA:[id number]  Outpatient Primary MD for the patient is Glendale Chard, MD   Admit date - 03/21/2020   LOS - 54  Chief Complaint  Patient presents with  . Hypotension  . Back Pain       Brief Narrative: Patient is a 54 y.o. female with PMHx of DM-2, HLD, hypothyroidism, PE-s/p IVC filter in 2012, history of colon cancer-diagnosed with COVID-19 on 5/21-presented with fatigue/malaise, low back pain-found to have patchy airspace disease on CTA chest, MRI thoracic/lumbar spine showed T7 disc herniation-but also showed extensive clot in the IVC.  Subsequently a Doppler showed extensive DVT in the bilateral lower extremity.  She was started on IV heparin-vascular surgery was consulted.  See below for further details.  Significant Events: 6/1>> admit to [facility] for low back pain/weakness/malaise-found to have COVID-19 pneumonia on CT imaging. 6/2>> lower extremity Doppler/MRI LS spine-significant lower extremity DVT extending up to IVC filter 6/8>> catheter guided lytic therapy for bilateral lower extremity DVT 6/9>> mechanical thrombectomy/IVC filter removal 6/10>> hypotension overnight-requiring initiation of Levophed 6/11>> off Levophed since 6/10 evening-transfer out of ICU  Significant studies: 6/1>> CTA chest: No PE-multifocal patchy airspace opacities throughout both lungs. 6/1>> CT abdomen/pelvis: Hepatic steatosis, aortic atherosclerosis 6/2>> MRI thoracic spine: T7-8 central to left-sided disc herniation 6/2>> MRI lumbar spine: No significant disc pathology/no stenosis or neurocompression.  Thrombosis of the IVC, both iliac veins to  the level of the IVC filter. 6/2>> lower extremity Doppler: Bilateral lower extremity extensive DVT.  Procedures: 6/8>>  #1: Ultrasound-guided access, bilateral popliteal vein  #2: Intravascular ultrasound, bilateral femoral, common femoral, external iliac, common iliac veins, and inferior vena cava #3: Placement of bilateral intravenous infusion catheter for thrombolytic therapy into the vena cava.                         #4: Initiation of thrombolytic therapy (TPA into the inferior vena cava, common iliac, external iliac, common femoral, and femoral veins  6/9>> 1.  Ultrasound-guided cannulation  of right internal jugular vein 2.  Ultrasound-guided cannulation of left internal jugular vein 3.  Ultrasound-guided placement of 7 French double-lumen central venous line and left internal jugular vein 4.  Intravascular ultrasound of bilateral femoral, bilateral common femoral, bilateral external iliac and bilateral common iliac veins and IVC 5.  IVC venogram 6.  Angio VAC and cleaner wire mechanical thrombectomy of IVC, bilateral common iliac, bilateral external iliac, bilateral common femoral and bilateral femoral veins 7.  Stent of IVC with 18 x 90 mm Wallstent 8.  Ultrasound-guided cannulation right common femoral vein 9.  Veno venous bypass for 58 minutes  COVID-19 medications: Steroids:6/1>>6/9 Remdesivir:6/1>>6/6  Antibiotics: Zithromax: 6/1>> 6/4 Rocephin: 6/1>> 6/4  Microbiology data: 6/1>> blood culture: Negative 6/2>> urine culture: Negative  Consults: VVS  DVT prophylaxis: Xarelto   Subjective:   One episode of vomiting yesterday afternoon-none since then.  Passing flatus-no BM for 2 days.   Assessment  & Plan :   Sepsis secondary to Covid 19 Viral pneumonia: Sepsis physiology has resolved-has completed a course of steroids/remdesivir.  Remains stable on room air.  Fever: afebrile  O2 requirements:  SpO2: 99 % O2 Flow Rate (L/min): 2 L/min   COVID-19  Labs: No results for input(s): DDIMER, FERRITIN, LDH, CRP in the last 72 hours.     Component Value Date/Time   BNP 29.9 03/21/2020 2148    No results for input(s): PROCALCITON in the last 168 hours.  Lab Results  Component Value Date   SARSCOV2NAA POSITIVE (A) 03/10/2020    Prone/Incentive Spirometry: encouraged incentive spirometry use 3-4/hour.  Extensive bilateral lower extremity DVTs-involving bilateral iliac veins and extending to IVC up to the level of IVC filter: s/p bilateral popliteal lytic catheter placement on 6/8-and mechanical thrombectomy/IVC filter on 6/9-vascular surgery following.  Switched to argatroban due to thrombocytopenia-HIT antibody negative-platelet counts improving-has been transitioned to Xarelto on 6/14.  Hypovolemic shock: Resolved-secondary to blood loss-resuscitated with IV fluids-briefly required Levophed while in the ICU.  BP now stable.  Acute blood loss anemia: Secondary to perioperative blood loss-see operative note-follow CBC-no indication for transfusion.  Hemoglobin slowly improving.  Thrombocytopenia: Continues to have stable thrombocytopenia-remains on argatroban-HIT antibody negative.  Platelet count slowly improving-follow closely.  AKI: Likely hemodynamically mediated-resolved  DM-2 (A1c 8.6) with uncontrolled hyperglycemia with mild DKA: CBG stable with SSI and Tradjenta.  Long-acting insulin discontinued on 6/14 as CBGs only in the 80s/90s.  Recent Labs    04/04/20 1718 04/04/20 2130 04/05/20 0612  GLUCAP 118* 107* 120*    T7-T8 disc herniation: Prior MD-Dr. Waldron Labs already discussed MRI findings with Dr. Jenkins-recommendations were for outpatient follow-up.  Hypothyroidism: Continue Synthroid.  TSH mildly elevated-not sure when her Synthroid dosing was adjusted last-stable for outpatient follow-up/adjustment with PCP.  HLD: Continue statin  Constipation: Resolved-continue MiraLAX/senna.  Deconditioning/debility: Secondary  to acute illness/blood loss anemia/numerous procedures-has nausea/pain/tachycardia with ambulation -evaluated by rehab services-recommendations are for [employer] authorization.  Nutrition Problem: Nutrition Problem: Increased nutrient needs Etiology: acute illness (COVID) Signs/Symptoms: estimated needs Interventions: Glucerna shake  ABG:    Component Value Date/Time   PHART 7.345 (L) 03/29/2020 1425   PCO2ART 42.2 03/29/2020 1425   PO2ART 200 (H) 03/29/2020 1425   HCO3 23.0 03/29/2020 1425   TCO2 24 03/29/2020 1425   ACIDBASEDEF 2.0 03/29/2020 1425   O2SAT 100.0 03/29/2020 1425    Vent Settings: N/A   Condition -Stable  Family Communication  : spouse over the phone 6/14  Code Status :  Full Code  Diet :  Diet Order            Diet Carb Modified Fluid consistency: Thin; Room service appropriate? Yes  Diet effective now                  Disposition Plan  : Status is: Inpatient  Remains inpatient appropriate because:Inpatient level of care appropriate due to severity of illness  Dispo: The patient is from: Home              Anticipated d/c is to: Home              Anticipated d/c date is: 2 days              Patient currently is medically stable to d/c.  Barriers to discharge: Awaiting insurance authorization for CIR.  Antimicorbials  :    Anti-infectives (From admission, onward)   Start     Dose/Rate Route Frequency Ordered Stop   03/23/20 2200  azithromycin (ZITHROMAX) tablet 500 mg  Status:  Discontinued        500 mg Oral Daily at bedtime 03/23/20 0806 03/25/20 1233   03/23/20 1000  remdesivir 100 mg in sodium chloride 0.9 % 100 mL IVPB       "Followed by" Linked Group Details   100 mg 200 mL/hr over 30 Minutes Intravenous Daily 03/22/20 0110 03/26/20 1913   03/22/20 2200  azithromycin (ZITHROMAX) 500 mg in sodium chloride 0.9 % 250 mL IVPB  Status:  Discontinued        500 mg 250 mL/hr over 60 Minutes Intravenous Every 24 hours 03/22/20  0111 03/23/20 0806   03/22/20 2200  cefTRIAXone (ROCEPHIN) 1 g in sodium chloride 0.9 % 100 mL IVPB  Status:  Discontinued        1 g 200 mL/hr over 30 Minutes Intravenous Every 24 hours 03/22/20 0111 03/25/20 1233   03/22/20 0200  remdesivir 200 mg in sodium chloride 0.9% 250 mL IVPB       "Followed by" Linked Group Details   200 mg 580 mL/hr over 30 Minutes Intravenous Once 03/22/20 0110 03/22/20 1900   03/22/20 0015  cefTRIAXone (ROCEPHIN) 1 g in sodium chloride 0.9 % 100 mL IVPB        1 g 200 mL/hr over 30 Minutes Intravenous  Once 03/22/20 0010 03/22/20 0103   03/21/20 2300  cefTRIAXone (ROCEPHIN) injection 1 g  Status:  Discontinued        1 g Intramuscular  Once 03/21/20 2253 03/22/20 0010   03/21/20 2300  azithromycin (ZITHROMAX) 500 mg in sodium chloride 0.9 % 250 mL IVPB        500 mg 250 mL/hr over 60 Minutes Intravenous  Once 03/21/20 2253 03/22/20 0129      Inpatient Medications  Scheduled Meds: . vitamin C  500 mg Oral Daily  . Chlorhexidine Gluconate Cloth  6 each Topical Daily  . famotidine  20 mg Oral BID  . feeding supplement (GLUCERNA SHAKE)  237 mL Oral TID BM  . insulin aspart  0-15 Units Subcutaneous TID WC  . insulin aspart  0-5 Units Subcutaneous QHS  . levothyroxine  100 mcg Oral Q0600  . linagliptin  5 mg Oral Daily  . multivitamin with minerals  1 tablet Oral Daily  . mupirocin ointment   Topical BID  . rivaroxaban  20 mg Oral Q supper  . senna-docusate  2 tablet Oral QHS  . simvastatin  10 mg Oral Daily  . sodium chloride flush  10-40 mL Intracatheter Q12H  . sodium chloride flush  3 mL Intravenous Q12H  . zinc sulfate  220 mg Oral Daily   Continuous Infusions: . sodium chloride Stopped (03/31/20 0846)  . sodium chloride 75 mL/hr at 04/05/20 1027  . sodium chloride     PRN Meds:.sodium chloride, acetaminophen, albuterol, bisacodyl, guaiFENesin-dextromethorphan, HYDROmorphone (DILAUDID) injection, loperamide, ondansetron **OR** ondansetron  (ZOFRAN) IV, oxyCODONE, promethazine, sodium chloride, sodium chloride flush, traMADol, zolpidem   Time Spent in minutes  15  See all Orders from today for further details   Oren Binet M.D on 04/05/2020 at 10:37 AM  To page go to [email or web address] - use universal password  [facility] -  Office  [phone]    Objective:   Vitals:   04/04/20 2054 04/05/20 0032 04/05/20 0500 04/05/20 0750  BP: 124/75 111/83 116/78 129/81  Pulse: 84   84  Resp: 15 15 16 15   Temp: 99.3 F (37.4 C) 98.9 F (37.2 C) 98.8 F (37.1 C) 98 F (36.7 C)  TempSrc: Oral Oral Oral Oral  SpO2: 98% 98%  99%  Weight:   101.3 kg   Height:        Wt Readings from Last 3 Encounters:  04/05/20 101.3 kg  02/15/20 91.2 kg  08/02/19 95.3 kg     Intake/Output Summary (Last 24 hours) at 04/05/2020 1037 Last data filed at 04/05/2020 0823 Gross per 24 hour  Intake 120 ml  Output 300 ml  Net -180 ml     Physical Exam Gen Exam:Alert awake-not in any distress HEENT:atraumatic, normocephalic Chest: B/L clear to auscultation anteriorly CVS:S1S2 regular Abdomen:soft non tender, non distended Extremities:no edema Neurology: Non focal Skin: no rash   Data Review:    CBC Recent Labs  Lab 03/29/20 1614 03/30/20 0241 03/30/20 0922 03/31/20 0437 04/01/20 0354 04/02/20 0422 04/03/20 0510 04/04/20 0238 04/05/20 0351  WBC 14.6*   < > 10.2   < > 8.6 6.9 8.2 11.5* 12.5*  HGB 12.4   < > 8.8*   < > 9.0* 8.7* 9.6* 9.8* 10.0*  HCT 36.7   < > 25.6*   < > 27.2* 26.2* 28.2* 30.3* 30.8*  PLT PLATELET CLUMPS NOTED ON SMEAR, UNABLE TO ESTIMATE   < > 66*   < > 74* 81* 99* 114* 150  MCV 86.6   < > 85.0   < > 90.1 89.7 91.0 92.1 92.8  MCH 29.2   < > 29.2   < > 29.8 29.8 31.0 29.8 30.1  MCHC 33.8   < > 34.4   < > 33.1 33.2 34.0 32.3 32.5  RDW 15.3   < > 17.0*   < > 16.5* 16.1* 16.5* 16.7* 17.0*  LYMPHSABS 1.4  --  1.1  --   --   --   --   --   --   MONOABS 1.0  --  0.6  --   --   --   --   --   --    EOSABS 0.0  --  0.0  --   --   --   --   --   --   BASOSABS 0.0  --  0.0  --   --   --   --   --   --    < > = values in this interval not displayed.    Chemistries  Recent Labs  Lab 03/29/20 1425 03/29/20 1614 03/30/20 0241 03/30/20 0922 03/31/20 0437  NA 138 137 135 136 136  K 3.8 4.1 4.7 4.0 3.9  CL  --  108 106 107 108  CO2  --  19* 21* 22 23  GLUCOSE  --  298* 211* 173* 135*  BUN  --  18 21* 15 9  CREATININE  --  0.63 0.87 0.70 0.60  CALCIUM  --  7.7* 7.7* 7.6* 7.9*  AST  --   --  16  --  15  ALT  --   --  22  --  22  ALKPHOS  --   --  40  --  47  BILITOT  --   --  1.1  --  1.1   ------------------------------------------------------------------------------------------------------------------ No results for input(s): CHOL, HDL, LDLCALC, TRIG, CHOLHDL, LDLDIRECT in the last 72 hours.  Lab Results  Component Value Date   HGBA1C 8.6 (H) 03/23/2020   ------------------------------------------------------------------------------------------------------------------ No results for input(s): TSH, T4TOTAL, T3FREE, THYROIDAB in the last 72 hours.  Invalid input(s): FREET3 ------------------------------------------------------------------------------------------------------------------ No results for input(s): VITAMINB12, FOLATE, FERRITIN, TIBC, IRON, RETICCTPCT in the last 72 hours.  Coagulation profile No results for input(s): INR, PROTIME in the last 168 hours.  No results for input(s): DDIMER in the last 72 hours.  Cardiac Enzymes No results for input(s): CKMB, TROPONINI, MYOGLOBIN in the last 168 hours.  Invalid input(s): CK ------------------------------------------------------------------------------------------------------------------    Component Value Date/Time   BNP 29.9 03/21/2020 2148    Micro Results No results found for this or any previous visit (from the past 240 hour(s)).  Radiology Reports CT Angio Chest PE W and/or Wo Contrast  Result Date:  03/22/2020 CLINICAL DATA:  Severe back pain hypotension and shallow EXAM: CT ANGIOGRAPHY CHEST WITH CONTRAST TECHNIQUE: Multidetector CT imaging of the chest was performed using the standard protocol during bolus administration of intravenous contrast. Multiplanar CT image reconstructions and MIPs were obtained to evaluate the vascular anatomy. CONTRAST:  67mL OMNIPAQUE IOHEXOL 350 MG/ML SOLN COMPARISON:  None. FINDINGS: Cardiovascular: There is a optimal opacification of the pulmonary arteries. There is no central,segmental, or subsegmental filling defects within the pulmonary arteries. The heart is normal in size. No pericardial effusion or thickening. No evidence right heart strain. There is normal three-vessel brachiocephalic anatomy without proximal stenosis. The thoracic aorta is normal in appearance. Mediastinum/Nodes: No hilar, mediastinal, or axillary adenopathy. Thyroid gland, trachea, and esophagus demonstrate no significant findings. Lungs/Pleura: Multifocal patchy ground-glass opacities are seen predominantly within the periphery of both lungs. No pleural effusion or pneumothorax is seen. Upper Abdomen: No acute abnormalities present in the visualized portions of the upper abdomen. Musculoskeletal: No chest wall abnormality. No acute or significant osseous findings. Review of the MIP images confirms the above findings. Abdomen/pelvis: Hepatobiliary: There is diffuse low density seen throughout the liver parenchyma. The main portal vein is patent. No evidence of calcified gallstones, gallbladder wall thickening or biliary dilatation. Pancreas: Unremarkable. No pancreatic ductal dilatation or surrounding inflammatory changes. Spleen: Normal in size without focal abnormality. Adrenals/Urinary Tract: Both adrenal glands appear normal. The kidneys and collecting system appear normal without evidence of urinary tract calculus or hydronephrosis. Bladder is unremarkable. Stomach/Bowel: The stomach, small bowel,  and colon are normal in appearance. No inflammatory changes, wall thickening, or obstructive findings. There appears to be a surgical anastomosis at the sigmoid rectal junction. Mild presacral soft tissue density is again identified. Vascular/Lymphatic: There are no enlarged mesenteric, retroperitoneal, or pelvic lymph nodes. An infrarenal IVC filter is seen at approximately the L3 level. There is scattered aortic and bi-iliac calcifications noted. Reproductive: The uterus and  adnexa are unremarkable. Other: No evidence of abdominal wall mass or hernia. Musculoskeletal: No acute or significant osseous findings. IMPRESSION: No central, segmental, or subsegmental pulmonary embolism. Multifocal patchy airspace opacities throughout both lungs, likely consistent with multifocal pneumonia. Hepatic steatosis Aortic Atherosclerosis (ICD10-I70.0). Electronically Signed   By: Prudencio Pair M.D.   On: 03/22/2020 00:07   MR THORACIC SPINE W WO CONTRAST  Result Date: 03/22/2020 CLINICAL DATA:  Bilateral lower extremity pain and numbness with urinary retention. Diabetes. History of colon cancer. EXAM: MRI THORACIC WITHOUT AND WITH CONTRAST TECHNIQUE: Multiplanar and multiecho pulse sequences of the thoracic spine were obtained without and with intravenous contrast. CONTRAST:  9.32mL GADAVIST GADOBUTROL 1 MMOL/ML IV SOLN COMPARISON:  CT chest done yesterday. FINDINGS: MRI THORACIC SPINE FINDINGS Alignment:  Normal Vertebrae: Normal. No fracture or primary bone lesion. No metastatic disease. Insignificant small hemangioma within the T11 vertebral body. Cord: No primary cord pathology. See below regarding stenosis at T7-8. Paraspinal and other soft tissues: Negative Disc levels: No abnormality at T5-6 or above. T6-7: Small right paracentral disc protrusion indents the thecal sac slightly but does not cause apparent neural compression. T7-8: Left posterolateral disc herniation effaces the ventral subarachnoid space and indents the  left side of the cord. This could be symptomatic. T8-9 through T12-L1: Normal. 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 Ample subarachnoid space is present dorsal to the cord at this level. None the less, this could possibly be symptomatic. Small right paracentral disc herniation at T6-7 without apparent neural compression. Electronically Signed   By: Nelson Chimes M.D.   On: 03/22/2020 12:32   MR Lumbar Spine W Wo Contrast  Result Date: 03/22/2020 CLINICAL DATA:  Back pain.  Urinary retention.  Diabetes. EXAM: MRI LUMBAR SPINE WITHOUT AND WITH CONTRAST TECHNIQUE: Multiplanar and multiecho pulse sequences of the lumbar spine were obtained without and with intravenous contrast. CONTRAST:  9.32mL GADAVIST GADOBUTROL 1 MMOL/ML IV SOLN COMPARISON:  None. FINDINGS: Segmentation:  5 lumbar type vertebral bodies. Alignment:  Slightly exaggerated lower lumbar lordosis. Vertebrae:  No fracture or primary bone lesion. Conus medullaris and cauda equina: Conus extends to the L1 level. Conus and cauda equina appear normal. Paraspinal and other soft tissues: There appears be thrombosis of the IVC and both iliac veins up to the level of the IVC filter. Flow is restored in the IVC by inflow from the renal veins. Disc levels: No abnormality from L1-2 through L2-3. L3-4: No disc abnormality.  Mild facet hypertrophy.  No stenosis. L4-5: No disc abnormality. Mild to moderate facet hypertrophy. No stenosis. L5-S1: Minimal disc bulge.  Mild facet hypertrophy.  No stenosis. IMPRESSION: No significant disc pathology. No stenosis or neural compression in the lumbar region. Patient has exaggerated lumbar lordosis. 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 There is thrombosis of the IVC and both iliac veins to the level of  the IVC filter. Flow is restored in the IVC above that by inflow from the renal veins. I am in the process of calling this report. Electronically Signed   By: Nelson Chimes M.D.   On: 03/22/2020 12:46   CT ABDOMEN PELVIS W CONTRAST  Result Date: 03/22/2020 CLINICAL DATA:  Severe back pain hypotension and shallow EXAM: CT ANGIOGRAPHY CHEST WITH CONTRAST TECHNIQUE: Multidetector CT imaging of the  chest was performed using the standard protocol during bolus administration of intravenous contrast. Multiplanar CT image reconstructions and MIPs were obtained to evaluate the vascular anatomy. CONTRAST:  19mL OMNIPAQUE IOHEXOL 350 MG/ML SOLN COMPARISON:  None. FINDINGS: Cardiovascular: There is a optimal opacification of the pulmonary arteries. There is no central,segmental, or subsegmental filling defects within the pulmonary arteries. The heart is normal in size. No pericardial effusion or thickening. No evidence right heart strain. There is normal three-vessel brachiocephalic anatomy without proximal stenosis. The thoracic aorta is normal in appearance. Mediastinum/Nodes: No hilar, mediastinal, or axillary adenopathy. Thyroid gland, trachea, and esophagus demonstrate no significant findings. Lungs/Pleura: Multifocal patchy ground-glass opacities are seen predominantly within the periphery of both lungs. No pleural effusion or pneumothorax is seen. Upper Abdomen: No acute abnormalities present in the visualized portions of the upper abdomen. Musculoskeletal: No chest wall abnormality. No acute or significant osseous findings. Review of the MIP images confirms the above findings. Abdomen/pelvis: Hepatobiliary: There is diffuse low density seen throughout the liver parenchyma. The main portal vein is patent. No evidence of calcified gallstones, gallbladder wall thickening or biliary dilatation. Pancreas: Unremarkable. No pancreatic ductal dilatation or surrounding inflammatory changes. Spleen: Normal in size without focal  abnormality. Adrenals/Urinary Tract: Both adrenal glands appear normal. The kidneys and collecting system appear normal without evidence of urinary tract calculus or hydronephrosis. Bladder is unremarkable. Stomach/Bowel: The stomach, small bowel, and colon are normal in appearance. No inflammatory changes, wall thickening, or obstructive findings. There appears to be a surgical anastomosis at the sigmoid rectal junction. Mild presacral soft tissue density is again identified. Vascular/Lymphatic: There are no enlarged mesenteric, retroperitoneal, or pelvic lymph nodes. An infrarenal IVC filter is seen at approximately the L3 level. There is scattered aortic and bi-iliac calcifications noted. Reproductive: The uterus and adnexa are unremarkable. Other: No evidence of abdominal wall mass or hernia. Musculoskeletal: No acute or significant osseous findings. IMPRESSION: No central, segmental, or subsegmental pulmonary embolism. Multifocal patchy airspace opacities throughout both lungs, likely consistent with multifocal pneumonia. Hepatic steatosis Aortic Atherosclerosis (ICD10-I70.0). Electronically Signed   By: Prudencio Pair M.D.   On: 03/22/2020 00:07   DG Chest Port 1 View  Result Date: 03/21/2020 CLINICAL DATA:  54 year old female with weakness and hypotension. EXAM: PORTABLE CHEST 1 VIEW COMPARISON:  Chest radiograph dated 03/25/2016 FINDINGS: Faint bilateral peripheral and subpleural densities, right greater left concerning for pneumonia, possibly atypical or viral in etiology including COVID-19. Clinical correlation is recommended. No focal consolidation, pleural effusion, pneumothorax. The cardiac silhouette is within limits. No acute osseous pathology. IMPRESSION: Findings concerning for pneumonia. Electronically Signed   By: Anner Crete M.D.   On: 03/21/2020 22:17   VAS [place] LOWER EXTREMITY VENOUS (DVT)  Result Date: 03/22/2020  Lower Venous DVTStudy Indications: Hx DVT and IVC filter, now with COVID  and elevated D dimer.  Comparison Study: 06/07/15 negative Performing Technologist: June Leap RDMS, RVT  Examination Guidelines: A complete evaluation includes B-mode imaging, spectral Doppler, color Doppler, and power Doppler as needed of all accessible portions of each vessel. Bilateral testing is considered an integral part of a complete examination. Limited examinations for reoccurring indications may be performed as noted. The reflux portion of the exam is performed with the patient in reverse Trendelenburg.  +---------+---------------+---------+-----------+----------+-----------------+ RIGHT    CompressibilityPhasicitySpontaneityPropertiesThrombus Aging    +---------+---------------+---------+-----------+----------+-----------------+ CFV      None           No       No  Acute             +---------+---------------+---------+-----------+----------+-----------------+ SFJ      None                                         Acute             +---------+---------------+---------+-----------+----------+-----------------+ FV Prox  None                                         Acute             +---------+---------------+---------+-----------+----------+-----------------+ FV Mid   None                                         Acute             +---------+---------------+---------+-----------+----------+-----------------+ FV DistalNone                                         Acute             +---------+---------------+---------+-----------+----------+-----------------+ PFV      None                                         Acute             +---------+---------------+---------+-----------+----------+-----------------+ POP      None           No       No                   Acute             +---------+---------------+---------+-----------+----------+-----------------+ PTV      None                                         Acute              +---------+---------------+---------+-----------+----------+-----------------+ PERO     None                                         Acute             +---------+---------------+---------+-----------+----------+-----------------+ Gastroc  None                                         Acute             +---------+---------------+---------+-----------+----------+-----------------+ GSV      None                                         Acute             +---------+---------------+---------+-----------+----------+-----------------+  Ex Iliac                No       No                   Age Indeterminate +---------+---------------+---------+-----------+----------+-----------------+ Com Iliac                                             Not visualized    +---------+---------------+---------+-----------+----------+-----------------+   +---------+---------------+---------+-----------+----------+-----------------+ LEFT     CompressibilityPhasicitySpontaneityPropertiesThrombus Aging    +---------+---------------+---------+-----------+----------+-----------------+ CFV      None           No       No                   Acute             +---------+---------------+---------+-----------+----------+-----------------+ SFJ      None                                         Acute             +---------+---------------+---------+-----------+----------+-----------------+ FV Prox  None                                         Acute             +---------+---------------+---------+-----------+----------+-----------------+ FV Mid   None                                         Acute             +---------+---------------+---------+-----------+----------+-----------------+ FV DistalNone                                         Acute             +---------+---------------+---------+-----------+----------+-----------------+ PFV      None                                          Acute             +---------+---------------+---------+-----------+----------+-----------------+ POP      None           No       No                   Acute             +---------+---------------+---------+-----------+----------+-----------------+ PTV      None                                         Acute             +---------+---------------+---------+-----------+----------+-----------------+ PERO     None  Acute             +---------+---------------+---------+-----------+----------+-----------------+ Gastroc  None                                         Acute             +---------+---------------+---------+-----------+----------+-----------------+ GSV      None                                         Acute             +---------+---------------+---------+-----------+----------+-----------------+ Ex Iliac                No       No                   Age Indeterminate +---------+---------------+---------+-----------+----------+-----------------+ Com Iliac                                             Not visualized    +---------+---------------+---------+-----------+----------+-----------------+     Summary: RIGHT: - Findings consistent with acute deep vein thrombosis involving the right common femoral vein, SF junction, right femoral vein, right proximal profunda vein, right popliteal vein, right posterior tibial veins, right peroneal veins, and right gastrocnemius veins. - Findings consistent with acute superficial vein thrombosis involving the right great saphenous vein. - Thrombosis extends into the Right Iliac vein and IVC up to the level of the filter placement.  LEFT: - Findings consistent with acute deep vein thrombosis involving the left common femoral vein, SF junction, left femoral vein, left proximal profunda vein, left popliteal vein, left posterior tibial veins, left peroneal veins, and left gastrocnemius  veins. - Findings consistent with acute superficial vein thrombosis involving the left great saphenous vein. - Thrombosis extends into the Left Iliac vein.  *See table(s) above for measurements and observations. Electronically signed by Curt Jews MD on 03/22/2020 at 5:19:17 PM.    Final    HYBRID OR IMAGING ([place])  Result Date: 03/29/2020 There is no interpretation for this exam.  This order is for images obtained during a surgical procedure.  Please See "Surgeries" Tab for more information regarding the procedure.   HYBRID OR IMAGING ([facility] ONLY)  Result Date: 03/28/2020 There is no interpretation for this exam.  This order is for images obtained during a surgical procedure.  Please See "Surgeries" Tab for more information regarding the procedure.

## 2020-04-06 ENCOUNTER — Encounter (HOSPITAL_COMMUNITY): Payer: Self-pay | Admitting: Internal Medicine

## 2020-04-06 ENCOUNTER — Inpatient Hospital Stay (HOSPITAL_COMMUNITY): Payer: No Typology Code available for payment source

## 2020-04-06 LAB — COMPREHENSIVE METABOLIC PANEL
ALT: 25 U/L (ref 0–44)
AST: 23 U/L (ref 15–41)
Albumin: 2.8 g/dL — ABNORMAL LOW (ref 3.5–5.0)
Alkaline Phosphatase: 76 U/L (ref 38–126)
Anion gap: 10 (ref 5–15)
BUN: 14 mg/dL (ref 6–20)
CO2: 28 mmol/L (ref 22–32)
Calcium: 9 mg/dL (ref 8.9–10.3)
Chloride: 98 mmol/L (ref 98–111)
Creatinine, Ser: 0.91 mg/dL (ref 0.44–1.00)
GFR calc Af Amer: >60 mL/min (ref >60)
GFR calc non Af Amer: >60 mL/min (ref >60)
Glucose, Bld: 113 mg/dL — ABNORMAL HIGH (ref 70–99)
Potassium: 4.2 mmol/L (ref 3.5–5.1)
Sodium: 136 mmol/L (ref 135–145)
Total Bilirubin: 2.5 mg/dL — ABNORMAL HIGH (ref 0.3–1.2)
Total Protein: 6.3 g/dL — ABNORMAL LOW (ref 6.5–8.1)

## 2020-04-06 LAB — GLUCOSE, CAPILLARY
Glucose-Capillary: 102 mg/dL — ABNORMAL HIGH (ref 70–99)
Glucose-Capillary: 97 mg/dL (ref 70–99)
Glucose-Capillary: 86 mg/dL (ref 70–99)
Glucose-Capillary: 106 mg/dL — ABNORMAL HIGH (ref 70–99)

## 2020-04-06 LAB — LIPASE, BLOOD: Lipase: 24 U/L (ref 11–51)

## 2020-04-06 MED ORDER — HEPARIN (PORCINE) 25000 UT/250ML-% IV SOLN
1550.0000 [IU]/h | INTRAVENOUS | Status: AC
  Administered 2020-04-06: 900 [IU]/h via INTRAVENOUS
  Administered 2020-04-07: 1000 [IU]/h via INTRAVENOUS
  Administered 2020-04-08: 1300 [IU]/h via INTRAVENOUS
  Administered 2020-04-09 – 2020-04-10 (×2): 1550 [IU]/h via INTRAVENOUS
  Filled 2020-04-06 (×5): qty 250

## 2020-04-06 MED ORDER — MORPHINE SULFATE (PF) 2 MG/ML IV SOLN: 1.0000 mg | INTRAVENOUS | Status: DC | PRN

## 2020-04-06 MED ORDER — MORPHINE SULFATE (PF) 2 MG/ML IV SOLN: 2.0000 mg | INTRAVENOUS | Status: DC | PRN

## 2020-04-06 MED ORDER — LEVOTHYROXINE SODIUM 100 MCG/5ML IV SOLN
50.0000 ug | Freq: Every day | INTRAVENOUS | Status: DC
  Administered 2020-04-07 – 2020-04-10 (×4): 50 ug via INTRAVENOUS
  Filled 2020-04-06 (×4): qty 5

## 2020-04-06 MED ORDER — IOHEXOL 9 MG/ML PO SOLN
500.0000 mL | ORAL | Status: AC
  Administered 2020-04-06: 500 mL via ORAL

## 2020-04-06 MED ORDER — PANTOPRAZOLE SODIUM 40 MG IV SOLR
40.0000 mg | INTRAVENOUS | Status: DC
  Administered 2020-04-06 – 2020-04-09 (×4): 40 mg via INTRAVENOUS
  Filled 2020-04-06 (×4): qty 40

## 2020-04-06 NOTE — Progress Notes (Signed)
Mobility Specialist: Progress Note    04/06/20 1416  Mobility  Activity Refused mobility  Mobility performed by Mobility specialist   [facility] [place] Mobility Specialist

## 2020-04-06 NOTE — Consult Note (Signed)
[name] 1965/10/27  [id number].    Requesting MD: Dr. 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  She underwent chemotherapy and radiation therapy for this.  Unfortunately, she got Covid on 03/10/20, but did not require hospitalization.  She then began having progressive abdominal pain .  She developed back pain and weakness and numbness in her BLE.  She presented to the ED for further evaluation.  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  Ultimately, she underwent lytic therapy as a 2 day procedure.  On the second day, she underwent angio VAC treatment to remove a good portion of the clot.  Her IVC filter was removed at the time as well.  During this, her IVC was punctured.  She developed a RTP hematoma postoperatively.  She has been struggling with nausea since after the procedure.  She has been trying to eat but in the last 1-2 days she has started to vomiting.  She has a CT scan that suggests a small bowel obstruction, but a transition point can not be identified.  She is still passing flatus and had a BM yesterday.  We have been asked to see her for further evaluation and recommendations.  ROS: ROS: Please see HPI, otherwise all other systems have been reviewed and are negative.  Family History  Problem Relation Age of Onset  . Heart disease Father   . Lung cancer Mother   . Brain cancer Mother   . Diabetes Mother   . Hypertension Mother   . Diabetes Brother   . Kidney disease Brother   . Cervical cancer Paternal Grandmother   . Breast cancer Neg Hx     Past Medical History:  Diagnosis Date  . 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  ECC with HPV effect  . 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 Diabetes mellitus without complication ([place])   . Headache(784.0)     experiences migraine every couple of weeks. takes only excedrin  . 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  colposcopy adequate normal 10/2015   . Hyperlipidemia   . Pulmonary embolism ([place])   . STD (sexually transmitted disease) 1991   POS GC-TREATED  . Thyroid disease     Past Surgical History:  Procedure Laterality Date  . AORTA - BILATERAL FEMORAL ARTERY BYPASS GRAFT N/A 03/29/2020   Procedure: VENO VENOUS BYPASS;  Surgeon: Waynetta Sandy, MD;  Location: [facility];  Service: Vascular;  Laterality: N/A;  . APPLICATION OF [place] N/A 03/29/2020   Procedure: [facility] MECHANICAL THROMBECTOMY;  Surgeon: Waynetta Sandy, MD;  Location: [place];  Service: Vascular;  Laterality: N/A;  . CESAREAN SECTION  2005  . COLON SURGERY    . ENDOVASCULAR STENT INSERTION N/A 03/29/2020   Procedure: Inferior Vena Cava WallStent Graft Insertion;  Surgeon: Waynetta Sandy, MD;  Location: [place];  Service: Vascular;  Laterality: N/A;  . INSERTION OF VENA CAVA FILTER Right 2012  . none    . PORT-A-CATH REMOVAL Right 12/22/2012   Procedure: REMOVAL PORT-A-CATH;  Surgeon: Odis Hollingshead, MD;  Location: [place];  Service: General;  Laterality: Right;  . PORTACATH  PLACEMENT  02/21/11   tip in mid [id number]  . 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 THROMBECTOMY ILIAC ARTERY N/A 03/29/2020   Procedure: THROMBECTOMY ILIAC ARTERY MECHANICAL THROMBECTOMY;  Surgeon: Waynetta Sandy, MD;  Location: [place];  Service: Vascular;  Laterality: N/A;    Social History:  reports that she has never smoked. She has never used smokeless tobacco. She reports current alcohol use. She reports that she does not use drugs.  Allergies:  Allergies  Allergen Reactions  . Metformin And Related Other (See Comments)    Chest pain  . Penicillins Hives    Medications Prior to  Admission  Medication Sig Dispense Refill  . aspirin 81 MG tablet Take 81 mg by mouth daily.    Marland Kitchen aspirin-acetaminophen-caffeine (EXCEDRIN MIGRAINE) 250-250-65 MG per tablet Take 2 tablets by mouth every 6 (six) hours as needed. For migraines    . azithromycin (ZITHROMAX) 500 MG tablet Take 500 mg by mouth daily.    . benzonatate (TESSALON) 100 MG capsule Take 1 capsule (100 mg total) by mouth every 8 (eight) hours. 30 capsule 0  . cholecalciferol (VITAMIN D) 400 UNITS TABS tablet Take 400 Units by mouth daily.     . cyanocobalamin 500 MCG tablet Take 500 mcg by mouth daily.    . Insulin Lispro (HUMALOG [place]) Inject 10 Units into the skin 3 (three) times daily with meals.     Marland Kitchen JARDIANCE 25 MG TABS tablet Take 25 mg by mouth daily.    Marland Kitchen levothyroxine (SYNTHROID, LEVOTHROID) 100 MCG tablet Take 100 mcg by mouth daily before breakfast.     . Multiple Vitamin (MULTIVITAMIN) tablet Take 1 tablet by mouth daily.    . ondansetron (ZOFRAN) 4 MG tablet Take 1 tablet (4 mg total) by mouth every 8 (eight) hours as needed for nausea or vomiting. 20 tablet 0  . pyridOXINE (VITAMIN B-6) 100 MG tablet Take 100 mg by mouth 2 (two) times daily.    . simvastatin (ZOCOR) 20 MG tablet Take 0.5 tablets (10 mg total) by mouth daily. 90 tablet 1  . ciprofloxacin (CIPRO) 500 MG tablet Take 500 mg by mouth 2 (two) times daily.    Marland Kitchen ibuprofen (ADVIL) 600 MG tablet Take 1 tablet (600 mg total) by mouth every 8 (eight) hours as needed. (Patient not taking: Reported on 03/22/2020) 60 tablet 1     Physical Exam: Blood pressure (!) 143/86, pulse 83, temperature 99.4 F (37.4 C), temperature source Oral, resp. rate 16, height 5\' 11"  (1.803 m), weight 101.8 kg, last menstrual period 09/12/2011, SpO2 95 %. General: pleasant, WD, WN black female who is laying in bed in NAD HEENT: head is normocephalic, atraumatic.  Sclera are noninjected.  PERRL.  Ears and nose without any masses or lesions.  Mouth is pink and moist.  Neck with  incision site on right site with dermabond present Heart: regular, rate, and rhythm.  Normal s1,s2. No obvious murmurs, gallops, or rubs noted.  Palpable radial and pedal pulses bilaterally Lungs: CTAB, no wheezes, rhonchi, or rales noted.  Respiratory effort nonlabored Abd: soft, distended, not really tender but about vomits when I press on her abdomen. +BS, no masses, hernias, or organomegaly.  Scars from prior colon surgery MS: all 4 extremities are symmetrical with no cyanosis, clubbing, or edema. Skin: warm and dry with no masses, lesions, or rashes Neuro: Cranial nerves 2-12 grossly intact, sensation is normal throughout Psych: A&Ox3 with an appropriate affect.   Results for orders  placed or performed during the hospital encounter of 03/21/20 (from the past 48 hour(s))  Glucose, capillary     Status: Abnormal   Collection Time: 04/04/20  5:18 PM  Result Value Ref Range   Glucose-Capillary 118 (H) 70 - 99 mg/dL    Comment: Glucose reference range applies only to samples taken after fasting for at least 8 hours.  Glucose, capillary     Status: Abnormal   Collection Time: 04/04/20  9:30 PM  Result Value Ref Range   Glucose-Capillary 107 (H) 70 - 99 mg/dL    Comment: Glucose reference range applies only to samples taken after fasting for at least 8 hours.   Comment 1 Notify RN    Comment 2 Document in Chart   CBC     Status: Abnormal   Collection Time: 04/05/20  3:51 AM  Result Value Ref Range   WBC 12.5 (H) 4.0 - 10.5 K/uL   RBC 3.32 (L) 3.87 - 5.11 MIL/uL   Hemoglobin 10.0 (L) 12.0 - 15.0 g/dL   HCT 30.8 (L) 36 - 46 %   MCV 92.8 80.0 - 100.0 fL   MCH 30.1 26.0 - 34.0 pg   MCHC 32.5 30.0 - 36.0 g/dL   RDW 17.0 (H) 11.5 - 15.5 %   Platelets 150 150 - 400 K/uL   nRBC 0.0 0.0 - 0.2 %    Comment: Performed at [facility], [address] [place], [name] [address]  Glucose, capillary     Status: Abnormal   Collection Time: 04/05/20  6:12 AM  Result Value Ref Range    Glucose-Capillary 120 (H) 70 - 99 mg/dL    Comment: Glucose reference range applies only to samples taken after fasting for at least 8 hours.   Comment 1 Notify RN    Comment 2 Document in Chart   Glucose, capillary     Status: Abnormal   Collection Time: 04/05/20 12:14 PM  Result Value Ref Range   Glucose-Capillary 130 (H) 70 - 99 mg/dL    Comment: Glucose reference range applies only to samples taken after fasting for at least 8 hours.  Glucose, capillary     Status: Abnormal   Collection Time: 04/05/20  5:06 PM  Result Value Ref Range   Glucose-Capillary 103 (H) 70 - 99 mg/dL    Comment: Glucose reference range applies only to samples taken after fasting for at least 8 hours.  Glucose, capillary     Status: Abnormal   Collection Time: 04/05/20  9:39 PM  Result Value Ref Range   Glucose-Capillary 113 (H) 70 - 99 mg/dL    Comment: Glucose reference range applies only to samples taken after fasting for at least 8 hours.  Glucose, capillary     Status: Abnormal   Collection Time: 04/06/20  6:21 AM  Result Value Ref Range   Glucose-Capillary 102 (H) 70 - 99 mg/dL    Comment: Glucose reference range applies only to samples taken after fasting for at least 8 hours.  Lipase, blood     Status: None   Collection Time: 04/06/20  8:55 AM  Result Value Ref Range   Lipase 24 11 - 51 U/L    Comment: Performed at [facility] [address] [place], [place] [address]  Comprehensive metabolic panel     Status: Abnormal   Collection Time: 04/06/20  8:55 AM  Result Value Ref Range   Sodium 136 135 - 145 mmol/L   Potassium 4.2 3.5 -  5.1 mmol/L   Chloride 98 98 - 111 mmol/L   CO2 28 22 - 32 mmol/L   Glucose, Bld 113 (H) 70 - 99 mg/dL    Comment: Glucose reference range applies only to samples taken after fasting for at least 8 hours.   BUN 14 6 - 20 mg/dL   Creatinine, Ser 0.91 0.44 - 1.00 mg/dL   Calcium 9.0 8.9 - 10.3 mg/dL   Total Protein 6.3 (L) 6.5 - 8.1 g/dL   Albumin 2.8 (L)  3.5 - 5.0 g/dL   AST 23 15 - 41 U/L   ALT 25 0 - 44 U/L   Alkaline Phosphatase 76 38 - 126 U/L   Total Bilirubin 2.5 (H) 0.3 - 1.2 mg/dL   GFR calc non Af Amer >60 >60 mL/min   GFR calc Af Amer >60 >60 mL/min   Anion gap 10 5 - 15    Comment: Performed at [facility], [place] [address] [place], [name] [address]  Glucose, capillary     Status: None   Collection Time: 04/06/20 11:16 AM  Result Value Ref Range   Glucose-Capillary 97 70 - 99 mg/dL    Comment: Glucose reference range applies only to samples taken after fasting for at least 8 hours.   Comment 1 Notify RN   Glucose, capillary     Status: None   Collection Time: 04/06/20  4:10 PM  Result Value Ref Range   Glucose-Capillary 86 70 - 99 mg/dL    Comment: Glucose reference range applies only to samples taken after fasting for at least 8 hours.   Comment 1 Notify RN    CT ABDOMEN PELVIS WO CONTRAST  Result Date: 04/06/2020 CLINICAL DATA:  Nausea and vomiting, intermittent for several days, sepsis, abdominal distension, recent procedure for removal of IVC filter/thrombectomy with development of IVC perforation EXAM: CT ABDOMEN AND PELVIS WITHOUT CONTRAST TECHNIQUE: Multidetector CT imaging of the abdomen and pelvis was performed following the standard protocol without IV contrast. COMPARISON:  04/06/2020 FINDINGS: Lower chest: Trace left pleural effusion. Minimal dependent bilateral lower lobe atelectasis. Hepatobiliary: The dome of the liver is excluded by slice selection. Visualized portions of the unenhanced liver unremarkable. Gallbladder is decompressed, without focal abnormality. Pancreas: Unremarkable. No pancreatic ductal dilatation or surrounding inflammatory changes. Spleen: Normal in size without focal abnormality. Adrenals/Urinary Tract: No urinary tract calculi. There is mild distension of the renal pelves and ureters, likely due to significant bladder distension. The adrenals are unremarkable. Stomach/Bowel: There is  evidence of high-grade small bowel obstruction. There is marked distension of the duodenum and jejunum. The distal jejunum and ileum are decompressed. Exact point of transition is not well visualized. Colon is decompressed. No bowel wall thickening Vascular/Lymphatic: Since the prior exam, the IVC filter has been removed and a stent placed within the IVC. There is evidence of retroperitoneal hematoma related to known IVC perforation intraoperatively. This measures up to 2.5 cm in thickness. Based on size, this is likely the sequela of the intraoperative perforation and there is no significant mass effect to suggest ongoing bleeding. Evaluation is limited without IV contrast. Minimal atherosclerosis of the aorta. No pathologic adenopathy. Reproductive: Uterus and bilateral adnexa are unremarkable. Other: There is no free intraperitoneal fluid or free gas. No abdominal wall hernia. Musculoskeletal: No acute or destructive bony lesions. Reconstructed images demonstrate no additional findings. IMPRESSION: 1. High-grade small bowel obstruction. Exact point of transition not well visualized, likely in the distal jejunum or proximal ileum. 2. Right-sided retroperitoneal  hematoma, likely due to the intraoperative IVC perforation during IVC filter removal. IVC stent placed in the interim. 3. Trace left pleural effusion. Electronically Signed   By: Randa Ngo M.D.   On: 04/06/2020 16:00   DG Abd 2 Views  Result Date: 04/06/2020 CLINICAL DATA:  Abdominal pain and nausea as well as constipation today. EXAM: ABDOMEN - 2 VIEW COMPARISON:  CT 03/21/2020 FINDINGS: Examination demonstrates several air-filled dilated centralized small bowel loops measuring up to 5.7 cm in diameter. No free peritoneal air. Mild air and stool within the colon. Several differential air-fluid levels are present on the decubitus film. Stent present over the mid to lower abdomen just right of midline. Remaining bones and soft tissues are  unremarkable. IMPRESSION: Findings likely due to small-bowel obstruction and less likely ileus. Electronically Signed   By: Marin Olp M.D.   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  She does need an NGT for decompression.  She appears to have contrast in her colon on her CT scan, although she was unable to drink this today so it is unclear what this is from.  She is passing some flatus and had a BM yesterday to suggest against a bowel obstruction.  However, she has had prior abdominal surgery, so it is possible.  For now, we will try conservative management with NGT, K>4 (4.2 today), minimizing narcotics, and mobilization.  At some point if needed, we can try to SBO protocol and given contrast through NGT if needed.  Will suck her out tonight and see how she is in the am.   FEN - NPO/NGT VTE - heparin gtt ID - none currently   Henreitta Cea, [facility] 04/06/2020, 4:39 PM Please see Amion for pager number during day hours 7:00am-4:30pm or 7:00am -11:30am on weekends

## 2020-04-06 NOTE — Progress Notes (Signed)
PROGRESS NOTE                                                                                                                                                                                                             Patient Demographics:    [name], is a 54 y.o. female, DOB - 1966/08/16, VZC:[id number]  Outpatient Primary MD for the patient is Glendale Chard, MD   Admit date - 03/21/2020   LOS - 41  Chief Complaint  Patient presents with  . Hypotension  . Back Pain       Brief Narrative: Patient is a 54 y.o. female with PMHx of DM-2, HLD, hypothyroidism, PE-s/p IVC filter in 2012, history of colon cancer-diagnosed with COVID-19 on 5/21-presented with fatigue/malaise, low back pain-found to have patchy airspace disease on CTA chest, MRI thoracic/lumbar spine showed T7 disc herniation-but also showed extensive clot in the IVC.  Subsequently a Doppler showed extensive DVT in the bilateral lower extremity.  She was started on IV heparin-vascular surgery was consulted.  See below for further details.  Significant Events: 6/1>> admit to [facility] for low back pain/weakness/malaise-found to have COVID-19 pneumonia on CT imaging. 6/2>> lower extremity Doppler/MRI LS spine-significant lower extremity DVT extending up to IVC filter 6/8>> catheter guided lytic therapy for bilateral lower extremity DVT 6/9>> mechanical thrombectomy/IVC filter removal 6/10>> hypotension overnight-requiring initiation of Levophed 6/11>> off Levophed since 6/10 evening-transfer out of ICU  Significant studies: 6/1>> CTA chest: No PE-multifocal patchy airspace opacities throughout both lungs. 6/1>> CT abdomen/pelvis: Hepatic steatosis, aortic atherosclerosis 6/2>> MRI thoracic spine: T7-8 central to left-sided disc herniation 6/2>> MRI lumbar spine: No significant disc pathology/no stenosis or neurocompression.  Thrombosis of the IVC, both iliac veins to  the level of the IVC filter. 6/2>> lower extremity Doppler: Bilateral lower extremity extensive DVT.  Procedures: 6/8>>  #1: Ultrasound-guided access, bilateral popliteal vein  #2: Intravascular ultrasound, bilateral femoral, common femoral, external iliac, common iliac veins, and inferior vena cava #3: Placement of bilateral intravenous infusion catheter for thrombolytic therapy into the vena cava.                         #4: Initiation of thrombolytic therapy (TPA into the inferior vena cava, common iliac, external iliac, common femoral, and femoral veins  6/9>> 1.  Ultrasound-guided cannulation  of right internal jugular vein 2.  Ultrasound-guided cannulation of left internal jugular vein 3.  Ultrasound-guided placement of 7 French double-lumen central venous line and left internal jugular vein 4.  Intravascular ultrasound of bilateral femoral, bilateral common femoral, bilateral external iliac and bilateral common iliac veins and IVC 5.  IVC venogram 6.  Angio VAC and cleaner wire mechanical thrombectomy of IVC, bilateral common iliac, bilateral external iliac, bilateral common femoral and bilateral femoral veins 7.  Stent of IVC with 18 x 90 mm Wallstent 8.  Ultrasound-guided cannulation right common femoral vein 9.  Veno venous bypass for 58 minutes  COVID-19 medications: Steroids:6/1>>6/9 Remdesivir:6/1>>6/6  Antibiotics: Zithromax: 6/1>> 6/4 Rocephin: 6/1>> 6/4  Microbiology data: 6/1>> blood culture: Negative 6/2>> urine culture: Negative  Consults: VVS  DVT prophylaxis: Xarelto   Subjective:   Had a BM yesterday-more vomiting since yesterday-belly slightly distended.  However is passing gas.   Assessment  & Plan :   Sepsis secondary to Covid 19 Viral pneumonia: Sepsis physiology has resolved-has completed a course of steroids/remdesivir.  Remains stable on room air.  Fever: afebrile  O2 requirements:  SpO2: 95 % O2 Flow Rate (L/min): 2 L/min   COVID-19  Labs: No results for input(s): DDIMER, FERRITIN, LDH, CRP in the last 72 hours.     Component Value Date/Time   BNP 29.9 03/21/2020 2148    No results for input(s): PROCALCITON in the last 168 hours.  Lab Results  Component Value Date   SARSCOV2NAA POSITIVE (A) 03/10/2020    Prone/Incentive Spirometry: encouraged incentive spirometry use 3-4/hour.  Extensive bilateral lower extremity DVTs-involving bilateral iliac veins and extending to IVC up to the level of IVC filter: s/p bilateral popliteal lytic catheter placement on 6/8-and mechanical thrombectomy/IVC filter on 6/9-vascular surgery following.  Switched to argatroban due to thrombocytopenia-HIT antibody negative-platelet counts improving-has been transitioned to Xarelto on 6/14.  Hypovolemic shock: Resolved-secondary to blood loss related to above procedures--resuscitated with IV fluids-briefly required Levophed while in the ICU.  BP now stable.  Acute blood loss anemia: Secondary to perioperative blood loss-see operative note-follow CBC-no indication for transfusion.  Hemoglobin slowly improving.  Thrombocytopenia: Continues to have stable thrombocytopenia-remains on argatroban-HIT antibody negative.  Platelet count slowly improving-follow closely.  Vomiting: Concern for ileus-await CT abdomen.  Lipase levels within normal limits.  AKI: Likely hemodynamically mediated-resolved  DM-2 (A1c 8.6) with uncontrolled hyperglycemia with mild DKA: CBG stable with SSI and Tradjenta.  Long-acting insulin discontinued on 6/14 as CBGs only in the 80s/90s.  Recent Labs    04/05/20 1706 04/05/20 2139 04/06/20 0621  GLUCAP 103* 113* 102*    T7-T8 disc herniation: Prior MD-Dr. Waldron Labs already discussed MRI findings with Dr. Jenkins-recommendations were for outpatient follow-up.  Hypothyroidism: Continue Synthroid.  TSH mildly elevated-not sure when her Synthroid dosing was adjusted last-stable for outpatient follow-up/adjustment with  PCP.  HLD: Continue statin  Constipation: Resolved-continue MiraLAX/senna.  Deconditioning/debility: Secondary to acute illness/blood loss anemia/numerous procedures-has nausea/pain/tachycardia with ambulation -evaluated by rehab services-recommendations are for [employer] authorization.  Nutrition Problem: Nutrition Problem: Increased nutrient needs Etiology: acute illness (COVID) Signs/Symptoms: estimated needs Interventions: Glucerna shake  ABG:    Component Value Date/Time   PHART 7.345 (L) 03/29/2020 1425   PCO2ART 42.2 03/29/2020 1425   PO2ART 200 (H) 03/29/2020 1425   HCO3 23.0 03/29/2020 1425   TCO2 24 03/29/2020 1425   ACIDBASEDEF 2.0 03/29/2020 1425   O2SAT 100.0 03/29/2020 1425    Vent Settings: N/A   Condition -Stable  Family Communication  :  spouse over the phone 6/17  Code Status :  Full Code  Diet :  Diet Order            Diet Carb Modified Fluid consistency: Thin; Room service appropriate? Yes  Diet effective now                  Disposition Plan  : Status is: Inpatient  Remains inpatient appropriate because:Inpatient level of care appropriate due to severity of illness  Dispo: The patient is from: Home              Anticipated d/c is to: Home              Anticipated d/c date is: 2 days              Patient currently is not medically stable to d/c.  Barriers to discharge: Developed more vomiting overnight-awaiting imaging studies to rule out ileus.  Antimicorbials  :    Anti-infectives (From admission, onward)   Start     Dose/Rate Route Frequency Ordered Stop   03/23/20 2200  azithromycin (ZITHROMAX) tablet 500 mg  Status:  Discontinued        500 mg Oral Daily at bedtime 03/23/20 0806 03/25/20 1233   03/23/20 1000  remdesivir 100 mg in sodium chloride 0.9 % 100 mL IVPB       "Followed by" Linked Group Details   100 mg 200 mL/hr over 30 Minutes Intravenous Daily 03/22/20 0110 03/26/20 1913   03/22/20 2200  azithromycin  (ZITHROMAX) 500 mg in sodium chloride 0.9 % 250 mL IVPB  Status:  Discontinued        500 mg 250 mL/hr over 60 Minutes Intravenous Every 24 hours 03/22/20 0111 03/23/20 0806   03/22/20 2200  cefTRIAXone (ROCEPHIN) 1 g in sodium chloride 0.9 % 100 mL IVPB  Status:  Discontinued        1 g 200 mL/hr over 30 Minutes Intravenous Every 24 hours 03/22/20 0111 03/25/20 1233   03/22/20 0200  remdesivir 200 mg in sodium chloride 0.9% 250 mL IVPB       "Followed by" Linked Group Details   200 mg 580 mL/hr over 30 Minutes Intravenous Once 03/22/20 0110 03/22/20 1900   03/22/20 0015  cefTRIAXone (ROCEPHIN) 1 g in sodium chloride 0.9 % 100 mL IVPB        1 g 200 mL/hr over 30 Minutes Intravenous  Once 03/22/20 0010 03/22/20 0103   03/21/20 2300  cefTRIAXone (ROCEPHIN) injection 1 g  Status:  Discontinued        1 g Intramuscular  Once 03/21/20 2253 03/22/20 0010   03/21/20 2300  azithromycin (ZITHROMAX) 500 mg in sodium chloride 0.9 % 250 mL IVPB        500 mg 250 mL/hr over 60 Minutes Intravenous  Once 03/21/20 2253 03/22/20 0129      Inpatient Medications  Scheduled Meds: . vitamin C  500 mg Oral Daily  . Chlorhexidine Gluconate Cloth  6 each Topical Daily  . feeding supplement (GLUCERNA SHAKE)  237 mL Oral TID BM  . insulin aspart  0-15 Units Subcutaneous TID WC  . insulin aspart  0-5 Units Subcutaneous QHS  . levothyroxine  100 mcg Oral Q0600  . linagliptin  5 mg Oral Daily  . multivitamin with minerals  1 tablet Oral Daily  . mupirocin ointment   Topical BID  . pantoprazole  40 mg Oral BID  . rivaroxaban  20 mg Oral Q supper  .  senna-docusate  2 tablet Oral QHS  . simvastatin  10 mg Oral Daily  . sodium chloride flush  10-40 mL Intracatheter Q12H  . sodium chloride flush  3 mL Intravenous Q12H  . zinc sulfate  220 mg Oral Daily   Continuous Infusions: . sodium chloride Stopped (03/31/20 0846)  . sodium chloride 75 mL/hr at 04/05/20 2329  . sodium chloride     PRN Meds:.sodium  chloride, acetaminophen, albuterol, bisacodyl, guaiFENesin-dextromethorphan, loperamide, ondansetron **OR** ondansetron (ZOFRAN) IV, oxyCODONE, promethazine, sodium chloride, sodium chloride flush, traMADol, zolpidem   Time Spent in minutes  25  See all Orders from today for further details   Oren Binet M.D on 04/06/2020 at 11:05 AM  To page go to [email or web address] - use universal password  [facility] -  Office  [phone]    Objective:   Vitals:   04/06/20 0013 04/06/20 0527 04/06/20 0530 04/06/20 0810  BP: 139/87 117/80  129/82  Pulse: 93 83  79  Resp: 17 15  15   Temp: 97.6 F (36.4 C) 97.6 F (36.4 C)  98.3 F (36.8 C)  TempSrc: Oral Oral  Oral  SpO2: 94% 96%  95%  Weight:   101.8 kg   Height:        Wt Readings from Last 3 Encounters:  04/06/20 101.8 kg  02/15/20 91.2 kg  08/02/19 95.3 kg     Intake/Output Summary (Last 24 hours) at 04/06/2020 1105 Last data filed at 04/06/2020 5093 Gross per 24 hour  Intake 624.36 ml  Output 1200 ml  Net -575.64 ml     Physical Exam Gen Exam:Alert awake-not in any distress HEENT:atraumatic, normocephalic Chest: B/L clear to auscultation anteriorly CVS:S1S2 regular Abdomen: Sluggish bowel sounds-slightly distended-nontender. Extremities:no edema Neurology: Non focal Skin: no rash   Data Review:    CBC Recent Labs  Lab 04/01/20 0354 04/02/20 0422 04/03/20 0510 04/04/20 0238 04/05/20 0351  WBC 8.6 6.9 8.2 11.5* 12.5*  HGB 9.0* 8.7* 9.6* 9.8* 10.0*  HCT 27.2* 26.2* 28.2* 30.3* 30.8*  PLT 74* 81* 99* 114* 150  MCV 90.1 89.7 91.0 92.1 92.8  MCH 29.8 29.8 31.0 29.8 30.1  MCHC 33.1 33.2 34.0 32.3 32.5  RDW 16.5* 16.1* 16.5* 16.7* 17.0*    Chemistries  Recent Labs  Lab 03/31/20 0437 04/06/20 0855  NA 136 136  K 3.9 4.2  CL 108 98  CO2 23 28  GLUCOSE 135* 113*  BUN 9 14  CREATININE 0.60 0.91  CALCIUM 7.9* 9.0  AST 15 23  ALT 22 25  ALKPHOS 47 76  BILITOT 1.1 2.5*    ------------------------------------------------------------------------------------------------------------------ No results for input(s): CHOL, HDL, LDLCALC, TRIG, CHOLHDL, LDLDIRECT in the last 72 hours.  Lab Results  Component Value Date   HGBA1C 8.6 (H) 03/23/2020   ------------------------------------------------------------------------------------------------------------------ No results for input(s): TSH, T4TOTAL, T3FREE, THYROIDAB in the last 72 hours.  Invalid input(s): FREET3 ------------------------------------------------------------------------------------------------------------------ No results for input(s): VITAMINB12, FOLATE, FERRITIN, TIBC, IRON, RETICCTPCT in the last 72 hours.  Coagulation profile No results for input(s): INR, PROTIME in the last 168 hours.  No results for input(s): DDIMER in the last 72 hours.  Cardiac Enzymes No results for input(s): CKMB, TROPONINI, MYOGLOBIN in the last 168 hours.  Invalid input(s): CK ------------------------------------------------------------------------------------------------------------------    Component Value Date/Time   BNP 29.9 03/21/2020 2148    Micro Results No results found for this or any previous visit (from the past 240 hour(s)).  Radiology Reports CT Angio Chest PE W and/or Wo Contrast  Result Date:  03/22/2020 CLINICAL DATA:  Severe back pain hypotension and shallow EXAM: CT ANGIOGRAPHY CHEST WITH CONTRAST TECHNIQUE: Multidetector CT imaging of the chest was performed using the standard protocol during bolus administration of intravenous contrast. Multiplanar CT image reconstructions and MIPs were obtained to evaluate the vascular anatomy. CONTRAST:  61mL OMNIPAQUE IOHEXOL 350 MG/ML SOLN COMPARISON:  None. FINDINGS: Cardiovascular: There is a optimal opacification of the pulmonary arteries. There is no central,segmental, or subsegmental filling defects within the pulmonary arteries. The heart is normal  in size. No pericardial effusion or thickening. No evidence right heart strain. There is normal three-vessel brachiocephalic anatomy without proximal stenosis. The thoracic aorta is normal in appearance. Mediastinum/Nodes: No hilar, mediastinal, or axillary adenopathy. Thyroid gland, trachea, and esophagus demonstrate no significant findings. Lungs/Pleura: Multifocal patchy ground-glass opacities are seen predominantly within the periphery of both lungs. No pleural effusion or pneumothorax is seen. Upper Abdomen: No acute abnormalities present in the visualized portions of the upper abdomen. Musculoskeletal: No chest wall abnormality. No acute or significant osseous findings. Review of the MIP images confirms the above findings. Abdomen/pelvis: Hepatobiliary: There is diffuse low density seen throughout the liver parenchyma. The main portal vein is patent. No evidence of calcified gallstones, gallbladder wall thickening or biliary dilatation. Pancreas: Unremarkable. No pancreatic ductal dilatation or surrounding inflammatory changes. Spleen: Normal in size without focal abnormality. Adrenals/Urinary Tract: Both adrenal glands appear normal. The kidneys and collecting system appear normal without evidence of urinary tract calculus or hydronephrosis. Bladder is unremarkable. Stomach/Bowel: The stomach, small bowel, and colon are normal in appearance. No inflammatory changes, wall thickening, or obstructive findings. There appears to be a surgical anastomosis at the sigmoid rectal junction. Mild presacral soft tissue density is again identified. Vascular/Lymphatic: There are no enlarged mesenteric, retroperitoneal, or pelvic lymph nodes. An infrarenal IVC filter is seen at approximately the L3 level. There is scattered aortic and bi-iliac calcifications noted. Reproductive: The uterus and adnexa are unremarkable. Other: No evidence of abdominal wall mass or hernia. Musculoskeletal: No acute or significant osseous  findings. IMPRESSION: No central, segmental, or subsegmental pulmonary embolism. Multifocal patchy airspace opacities throughout both lungs, likely consistent with multifocal pneumonia. Hepatic steatosis Aortic Atherosclerosis (ICD10-I70.0). Electronically Signed   By: Prudencio Pair M.D.   On: 03/22/2020 00:07   MR THORACIC SPINE W WO CONTRAST  Result Date: 03/22/2020 CLINICAL DATA:  Bilateral lower extremity pain and numbness with urinary retention. Diabetes. History of colon cancer. EXAM: MRI THORACIC WITHOUT AND WITH CONTRAST TECHNIQUE: Multiplanar and multiecho pulse sequences of the thoracic spine were obtained without and with intravenous contrast. CONTRAST:  9.87mL GADAVIST GADOBUTROL 1 MMOL/ML IV SOLN COMPARISON:  CT chest done yesterday. FINDINGS: MRI THORACIC SPINE FINDINGS Alignment:  Normal Vertebrae: Normal. No fracture or primary bone lesion. No metastatic disease. Insignificant small hemangioma within the T11 vertebral body. Cord: No primary cord pathology. See below regarding stenosis at T7-8. Paraspinal and other soft tissues: Negative Disc levels: No abnormality at T5-6 or above. T6-7: Small right paracentral disc protrusion indents the thecal sac slightly but does not cause apparent neural compression. T7-8: Left posterolateral disc herniation effaces the ventral subarachnoid space and indents the left side of the cord. This could be symptomatic. T8-9 through T12-L1: Normal. 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 Ample subarachnoid space is present dorsal to the cord at this level. None the less, this could possibly be symptomatic. Small right  paracentral disc herniation at T6-7 without apparent neural compression. Electronically Signed   By: Nelson Chimes M.D.   On: 03/22/2020 12:32   MR Lumbar Spine W Wo Contrast  Result Date: 03/22/2020 CLINICAL DATA:  Back pain.  Urinary  retention.  Diabetes. EXAM: MRI LUMBAR SPINE WITHOUT AND WITH CONTRAST TECHNIQUE: Multiplanar and multiecho pulse sequences of the lumbar spine were obtained without and with intravenous contrast. CONTRAST:  9.18mL GADAVIST GADOBUTROL 1 MMOL/ML IV SOLN COMPARISON:  None. FINDINGS: Segmentation:  5 lumbar type vertebral bodies. Alignment:  Slightly exaggerated lower lumbar lordosis. Vertebrae:  No fracture or primary bone lesion. Conus medullaris and cauda equina: Conus extends to the L1 level. Conus and cauda equina appear normal. Paraspinal and other soft tissues: There appears be thrombosis of the IVC and both iliac veins up to the level of the IVC filter. Flow is restored in the IVC by inflow from the renal veins. Disc levels: No abnormality from L1-2 through L2-3. L3-4: No disc abnormality.  Mild facet hypertrophy.  No stenosis. L4-5: No disc abnormality. Mild to moderate facet hypertrophy. No stenosis. L5-S1: Minimal disc bulge.  Mild facet hypertrophy.  No stenosis. IMPRESSION: No significant disc pathology. No stenosis or neural compression in the lumbar region. Patient has exaggerated lumbar lordosis. 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 Flow is restored in the IVC above that by inflow from the renal veins. I am in the process of calling this report. Electronically Signed   By: Nelson Chimes M.D.   On: 03/22/2020 12:46   CT ABDOMEN PELVIS W CONTRAST  Result Date: 03/22/2020 CLINICAL DATA:  Severe back pain hypotension and shallow EXAM: CT ANGIOGRAPHY CHEST WITH CONTRAST TECHNIQUE: Multidetector CT imaging of the chest was performed using the standard protocol during bolus administration of intravenous contrast. Multiplanar CT image reconstructions and MIPs were obtained to evaluate the vascular anatomy. CONTRAST:   81mL OMNIPAQUE IOHEXOL 350 MG/ML SOLN COMPARISON:  None. FINDINGS: Cardiovascular: There is a optimal opacification of the pulmonary arteries. There is no central,segmental, or subsegmental filling defects within the pulmonary arteries. The heart is normal in size. No pericardial effusion or thickening. No evidence right heart strain. There is normal three-vessel brachiocephalic anatomy without proximal stenosis. The thoracic aorta is normal in appearance. Mediastinum/Nodes: No hilar, mediastinal, or axillary adenopathy. Thyroid gland, trachea, and esophagus demonstrate no significant findings. Lungs/Pleura: Multifocal patchy ground-glass opacities are seen predominantly within the periphery of both lungs. No pleural effusion or pneumothorax is seen. Upper Abdomen: No acute abnormalities present in the visualized portions of the upper abdomen. Musculoskeletal: No chest wall abnormality. No acute or significant osseous findings. Review of the MIP images confirms the above findings. Abdomen/pelvis: Hepatobiliary: There is diffuse low density seen throughout the liver parenchyma. The main portal vein is patent. No evidence of calcified gallstones, gallbladder wall thickening or biliary dilatation. Pancreas: Unremarkable. No pancreatic ductal dilatation or surrounding inflammatory changes. Spleen: Normal in size without focal abnormality. Adrenals/Urinary Tract: Both adrenal glands appear normal. The kidneys and collecting system appear normal without evidence of urinary tract calculus or hydronephrosis. Bladder is unremarkable. Stomach/Bowel: The stomach, small bowel, and colon are normal in appearance. No inflammatory changes, wall thickening, or obstructive findings. There appears to be a surgical anastomosis at the sigmoid rectal junction. Mild presacral soft tissue  density is again identified. Vascular/Lymphatic: There are no enlarged mesenteric, retroperitoneal, or pelvic lymph nodes. An infrarenal IVC filter is  seen at approximately the L3 level. There is scattered aortic and bi-iliac calcifications noted. Reproductive: The uterus and adnexa are unremarkable. Other: No evidence of abdominal wall mass or hernia. Musculoskeletal: No acute or significant osseous findings. IMPRESSION: No central, segmental, or subsegmental pulmonary embolism. Multifocal patchy airspace opacities throughout both lungs, likely consistent with multifocal pneumonia. Hepatic steatosis Aortic Atherosclerosis (ICD10-I70.0). Electronically Signed   By: Prudencio Pair M.D.   On: 03/22/2020 00:07   DG Chest Port 1 View  Result Date: 03/21/2020 CLINICAL DATA:  54 year old female with weakness and hypotension. EXAM: PORTABLE CHEST 1 VIEW COMPARISON:  Chest radiograph dated 03/25/2016 FINDINGS: Faint bilateral peripheral and subpleural densities, right greater left concerning for pneumonia, possibly atypical or viral in etiology including COVID-19. Clinical correlation is recommended. No focal consolidation, pleural effusion, pneumothorax. The cardiac silhouette is within limits. No acute osseous pathology. IMPRESSION: Findings concerning for pneumonia. Electronically Signed   By: Anner Crete M.D.   On: 03/21/2020 22:17   VAS [place] LOWER EXTREMITY VENOUS (DVT)  Result Date: 03/22/2020  Lower Venous DVTStudy Indications: Hx DVT and IVC filter, now with COVID and elevated D dimer.  Comparison Study: 06/07/15 negative Performing Technologist: June Leap RDMS, RVT  Examination Guidelines: A complete evaluation includes B-mode imaging, spectral Doppler, color Doppler, and power Doppler as needed of all accessible portions of each vessel. Bilateral testing is considered an integral part of a complete examination. Limited examinations for reoccurring indications may be performed as noted. The reflux portion of the exam is performed with the patient in reverse Trendelenburg.  +---------+---------------+---------+-----------+----------+-----------------+  RIGHT    CompressibilityPhasicitySpontaneityPropertiesThrombus Aging    +---------+---------------+---------+-----------+----------+-----------------+ CFV      None           No       No                   Acute             +---------+---------------+---------+-----------+----------+-----------------+ SFJ      None                                         Acute             +---------+---------------+---------+-----------+----------+-----------------+ FV Prox  None                                         Acute             +---------+---------------+---------+-----------+----------+-----------------+ FV Mid   None                                         Acute             +---------+---------------+---------+-----------+----------+-----------------+ FV DistalNone                                         Acute             +---------+---------------+---------+-----------+----------+-----------------+ PFV      None  Acute             +---------+---------------+---------+-----------+----------+-----------------+ POP      None           No       No                   Acute             +---------+---------------+---------+-----------+----------+-----------------+ PTV      None                                         Acute             +---------+---------------+---------+-----------+----------+-----------------+ PERO     None                                         Acute             +---------+---------------+---------+-----------+----------+-----------------+ Gastroc  None                                         Acute             +---------+---------------+---------+-----------+----------+-----------------+ GSV      None                                         Acute             +---------+---------------+---------+-----------+----------+-----------------+ Ex Iliac                No       No                    Age Indeterminate +---------+---------------+---------+-----------+----------+-----------------+ Com Iliac                                             Not visualized    +---------+---------------+---------+-----------+----------+-----------------+   +---------+---------------+---------+-----------+----------+-----------------+ LEFT     CompressibilityPhasicitySpontaneityPropertiesThrombus Aging    +---------+---------------+---------+-----------+----------+-----------------+ CFV      None           No       No                   Acute             +---------+---------------+---------+-----------+----------+-----------------+ SFJ      None                                         Acute             +---------+---------------+---------+-----------+----------+-----------------+ FV Prox  None                                         Acute             +---------+---------------+---------+-----------+----------+-----------------+ FV  Mid   None                                         Acute             +---------+---------------+---------+-----------+----------+-----------------+ FV DistalNone                                         Acute             +---------+---------------+---------+-----------+----------+-----------------+ PFV      None                                         Acute             +---------+---------------+---------+-----------+----------+-----------------+ POP      None           No       No                   Acute             +---------+---------------+---------+-----------+----------+-----------------+ PTV      None                                         Acute             +---------+---------------+---------+-----------+----------+-----------------+ PERO     None                                         Acute             +---------+---------------+---------+-----------+----------+-----------------+ Gastroc  None                                          Acute             +---------+---------------+---------+-----------+----------+-----------------+ GSV      None                                         Acute             +---------+---------------+---------+-----------+----------+-----------------+ Ex Iliac                No       No                   Age Indeterminate +---------+---------------+---------+-----------+----------+-----------------+ Com Iliac                                             Not visualized    +---------+---------------+---------+-----------+----------+-----------------+     Summary: RIGHT: - Findings consistent with acute deep vein thrombosis involving the right common femoral vein, SF junction, right femoral vein, right proximal profunda vein, right  popliteal vein, right posterior tibial veins, right peroneal veins, and right gastrocnemius veins. - Findings consistent with acute superficial vein thrombosis involving the right great saphenous vein. - Thrombosis extends into the Right Iliac vein and IVC up to the level of the filter placement.  LEFT: - Findings consistent with acute deep vein thrombosis involving the left common femoral vein, SF junction, left femoral vein, left proximal profunda vein, left popliteal vein, left posterior tibial veins, left peroneal veins, and left gastrocnemius veins. - Findings consistent with acute superficial vein thrombosis involving the left great saphenous vein. - Thrombosis extends into the Left Iliac vein.  *See table(s) above for measurements and observations. Electronically signed by Curt Jews MD on 03/22/2020 at 5:19:17 PM.    Final    HYBRID OR IMAGING ([place])  Result Date: 03/29/2020 There is no interpretation for this exam.  This order is for images obtained during a surgical procedure.  Please See "Surgeries" Tab for more information regarding the procedure.   HYBRID OR IMAGING ([facility] ONLY)  Result Date: 03/28/2020 There is no  interpretation for this exam.  This order is for images obtained during a surgical procedure.  Please See "Surgeries" Tab for more information regarding the procedure.

## 2020-04-06 NOTE — Progress Notes (Signed)
ANTICOAGULATION CONSULT NOTE  Pharmacy Consult:  Heparin Indication: clotted IVC and DVTs  Labs: Recent Labs    04/04/20 0238 04/05/20 0351 04/06/20 0855  HGB 9.8* 10.0*  --   HCT 30.3* 30.8*  --   PLT 114* 150  --   CREATININE  --   --  0.91    Assessment: 21 yoF with PMH prior PE and DVT s/p IVC in 2012 on no anticoagulation at home admitted with iliac vein thrombus and occluded IVC filter. 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 Patient is s/p catheter-directed lysis and mechanical thrombectomy on 6/8 and 6/9 with no acute residual thrombus.  She was then transitioned to Xarelto, but now with SBO and Pharmacy consulted to restart IV heparin.  Last Xarelto dose was on 04/05/20 around 1800, so will likely need to dose heparin based on aPTT until heparin level and aPTT correlate.  No bleeding reported.  Goal of Therapy:  APTT 66-102 sec Heparin level 0.3-0.7 units/mL Monitor platelets per protocol: yes    Plan:  D/C Xarelto At 1800, start heparin gtt at 900 units/hr, no bolus Check 6 hr aPTT and heparin level Daily heparin level, aPTT and CBC  Tereza Gilham D. Mina Marble, PharmD, BCPS, [place] 04/06/2020, 3:55 PM

## 2020-04-06 NOTE — Progress Notes (Signed)
  Progress Note    04/06/2020 8:47 AM 8 Days Post-Op  Subjective: Not tolerating p.o.  Vitals:   04/06/20 0527 04/06/20 0810  BP: 117/80 129/82  Pulse: 83 79  Resp: 15 15  Temp: 97.6 F (36.4 C) 98.3 F (36.8 C)  SpO2: 96% 95%    Physical Exam: Awake alert oriented Mild distention of abdomen with moderate tenderness to palpation throughout Bilateral lower extremities with some increased edema today although nonpitting and symmetrical  CBC    Component Value Date/Time   WBC 12.5 (H) 04/05/2020 0351   RBC 3.32 (L) 04/05/2020 0351   HGB 10.0 (L) 04/05/2020 0351   HGB 13.3 12/17/2013 1538   HCT 30.8 (L) 04/05/2020 0351   HCT 40.6 12/17/2013 1538   PLT 150 04/05/2020 0351   PLT 219 12/17/2013 1538   MCV 92.8 04/05/2020 0351   MCV 90.3 12/17/2013 1538   MCH 30.1 04/05/2020 0351   MCHC 32.5 04/05/2020 0351   RDW 17.0 (H) 04/05/2020 0351   RDW 13.5 12/17/2013 1538   LYMPHSABS 1.1 03/30/2020 0922   LYMPHSABS 1.4 12/17/2013 1538   MONOABS 0.6 03/30/2020 0922   MONOABS 0.3 12/17/2013 1538   EOSABS 0.0 03/30/2020 0922   EOSABS 0.0 12/17/2013 1538   BASOSABS 0.0 03/30/2020 0922   BASOSABS 0.0 12/17/2013 1538    BMET    Component Value Date/Time   NA 136 03/31/2020 0437   NA 144 01/21/2018 1000   NA 142 12/17/2013 1538   K 3.9 03/31/2020 0437   K 4.1 12/17/2013 1538   CL 108 03/31/2020 0437   CL 106 12/07/2012 0838   CO2 23 03/31/2020 0437   CO2 28 12/17/2013 1538   GLUCOSE 135 (H) 03/31/2020 0437   GLUCOSE 123 12/17/2013 1538   GLUCOSE 119 (H) 12/07/2012 0838   BUN 9 03/31/2020 0437   BUN 12 01/21/2018 1000   BUN 14.1 12/17/2013 1538   CREATININE 0.60 03/31/2020 0437   CREATININE 0.96 10/27/2015 0904   CREATININE 0.8 12/17/2013 1538   CALCIUM 7.9 (L) 03/31/2020 0437   CALCIUM 9.7 12/17/2013 1538   GFRNONAA >60 03/31/2020 0437   GFRAA >60 03/31/2020 0437    INR    Component Value Date/Time   INR 1.5 (H) 03/21/2020 2143   INR 2.10 12/07/2012 1025      Intake/Output Summary (Last 24 hours) at 04/06/2020 0847 Last data filed at 04/06/2020 0500 Gross per 24 hour  Intake 624.36 ml  Output 400 ml  Net 224.36 ml     Assessment:  54 y.o. female is s/p:  1.  Ultrasound-guided cannulation of right internal jugular vein 2.  Ultrasound-guided cannulation of left internal jugular vein 3.  Ultrasound-guided placement of 7 French double-lumen central venous line and left internal jugular vein 4.  Intravascular ultrasound of bilateral femoral, bilateral common femoral, bilateral external iliac and bilateral common iliac veins and IVC 5.  IVC venogram 6.  Angio VAC and cleaner wire mechanical thrombectomy of IVC, bilateral common iliac, bilateral external iliac, bilateral common femoral and bilateral femoral veins 7.  Stent of IVC with 18 x 90 mm Wallstent 8.  Ultrasound-guided cannulation right common femoral vein 9.  Veno venous bypass for 58 minutes  Plan: I encouraged p.o. intake as tolerated and mobility with the help of our mobility specialist and physical therapy.  I will order KUB to evaluate for ileus.  Raziyah Vanvleck C. Donzetta Matters, MD [facility] Office: [phone] Pager: [phone]  04/06/2020 8:47 AM

## 2020-04-06 NOTE — Progress Notes (Signed)
Inpatient Rehabilitation-Admissions Coordinator   Pt very uncomfortable this AM and said she had a rough night. Noted pt undergoing work up for possible ileus vs SBO. AC will hold on possible admission today and will follow up tomorrow.   Please call if questions.   Raechel Ache, OTR/L  Rehab Admissions Coordinator  [phone] 04/06/2020 12:19 PM

## 2020-04-06 NOTE — Progress Notes (Signed)
X-ray abdomen shows SBO Patient has had several large volume of vomitus throughout the course of the day (per RN staff-2 episodes were almost more than 1000 cc)  Place NG tube Await CT abdomen Keep n.p.o. Change Xarelto to IV heparin Await further recommendations from general surgery

## 2020-04-07 ENCOUNTER — Inpatient Hospital Stay (HOSPITAL_COMMUNITY): Payer: No Typology Code available for payment source

## 2020-04-07 LAB — CBC
HCT: 29.8 % — ABNORMAL LOW (ref 36.0–46.0)
Hemoglobin: 9.6 g/dL — ABNORMAL LOW (ref 12.0–15.0)
MCH: 29.6 pg (ref 26.0–34.0)
MCHC: 32.2 g/dL (ref 30.0–36.0)
MCV: 92 fL (ref 80.0–100.0)
Platelets: 206 10*3/uL (ref 150–400)
RBC: 3.24 MIL/uL — ABNORMAL LOW (ref 3.87–5.11)
RDW: 16 % — ABNORMAL HIGH (ref 11.5–15.5)
WBC: 9 10*3/uL (ref 4.0–10.5)
nRBC: 0 % (ref 0.0–0.2)

## 2020-04-07 LAB — APTT
aPTT: 65 seconds — ABNORMAL HIGH (ref 24–36)
aPTT: 66 seconds — ABNORMAL HIGH (ref 24–36)

## 2020-04-07 LAB — BASIC METABOLIC PANEL
Anion gap: 10 (ref 5–15)
BUN: 10 mg/dL (ref 6–20)
CO2: 27 mmol/L (ref 22–32)
Calcium: 8.6 mg/dL — ABNORMAL LOW (ref 8.9–10.3)
Chloride: 102 mmol/L (ref 98–111)
Creatinine, Ser: 0.91 mg/dL (ref 0.44–1.00)
GFR calc Af Amer: >60 mL/min (ref >60)
GFR calc non Af Amer: >60 mL/min (ref >60)
Glucose, Bld: 119 mg/dL — ABNORMAL HIGH (ref 70–99)
Potassium: 4.3 mmol/L (ref 3.5–5.1)
Sodium: 139 mmol/L (ref 135–145)

## 2020-04-07 LAB — HEPARIN LEVEL (UNFRACTIONATED): Heparin Unfractionated: 1.5 IU/mL — ABNORMAL HIGH (ref 0.30–0.70)

## 2020-04-07 LAB — GLUCOSE, CAPILLARY
Glucose-Capillary: 103 mg/dL — ABNORMAL HIGH (ref 70–99)
Glucose-Capillary: 79 mg/dL (ref 70–99)
Glucose-Capillary: 88 mg/dL (ref 70–99)
Glucose-Capillary: 77 mg/dL (ref 70–99)

## 2020-04-07 MED ORDER — DIATRIZOATE MEGLUMINE & SODIUM 66-10 % PO SOLN
90.0000 mL | Freq: Once | ORAL | Status: AC
  Administered 2020-04-07: 90 mL via NASOGASTRIC
  Filled 2020-04-07: qty 90

## 2020-04-07 NOTE — Progress Notes (Signed)
PT Cancellation Note  Patient Details Name: [name] MRN: [id number] DOB: [birth year]   Cancelled Treatment:    Reason Eval/Treat Not Completed: Other (comment).  Headed to CT scan and nauseated, declined PT today.  Retry when pt can allow.   Ramond Dial 04/07/2020, 3:06 PM   Mee Hives, PT MS Acute Rehab Dept. Number: [place] [facility] [place]

## 2020-04-07 NOTE — Progress Notes (Addendum)
PROGRESS NOTE                                                                                                                                                                                                             Patient Demographics:    [name], is a 54 y.o. female, DOB - 1965/12/24, CBS:[id number]  Outpatient Primary MD for the patient is Glendale Chard, MD   Admit date - 03/21/2020   LOS - 35  Chief Complaint  Patient presents with  . Hypotension  . Back Pain       Brief Narrative: Patient is a 54 y.o. female with PMHx of DM-2, HLD, hypothyroidism, PE-s/p IVC filter in 2012, history of colon cancer-diagnosed with COVID-19 on 5/21-presented with fatigue/malaise, low back pain-found to have patchy airspace disease on CTA chest, MRI thoracic/lumbar spine showed T7 disc herniation-but also showed extensive clot in the IVC.  Subsequently a Doppler showed extensive DVT in the bilateral lower extremity.  She was started on IV heparin-vascular surgery was consulted underwent catheter guided lytic therapy-subsequent mechanical thrombectomy/IVC filter removal-complicated by IVC tear requiring IVC stenting.  Postoperatively-developed acute blood loss anemia with hypovolemic shock secondary to blood loss-monitored in the ICU-briefly required pressors.  Once stabilized-she was transferred to a medical unit-while awaiting CIR bed-she started developing vomiting-ultimately found to have ileus/partial small bowel obstruction.  See below for further details.  Significant Events: 6/1>> admit to [facility] for low back pain/weakness/malaise-found to have COVID-19 pneumonia on CT imaging. 6/2>> lower extremity Doppler/MRI LS spine-significant lower extremity DVT extending up to IVC filter 6/8>> catheter guided lytic therapy for bilateral lower extremity DVT 6/9>> mechanical thrombectomy/IVC filter removal 6/10>> hypotension overnight-requiring  initiation of Levophed 6/11>> off Levophed since 6/10 evening-transfer out of ICU 6/17>> worsening of vomiting-found to have ileus/SBO-NG tube inserted.  Significant studies: 6/1>> CTA chest: No PE-multifocal patchy airspace opacities throughout both lungs. 6/1>> CT abdomen/pelvis: Hepatic steatosis, aortic atherosclerosis 6/2>> MRI thoracic spine: T7-8 central to left-sided disc herniation 6/2>> MRI lumbar spine: No significant disc pathology/no stenosis or neurocompression.  Thrombosis of the IVC, both iliac veins to the level of the IVC filter. 6/2>> lower extremity Doppler: Bilateral lower extremity extensive DVT. 6/17>> CT abdomen/pelvis: High-grade small bowel obstruction, right-sided retroperitoneal hematoma  Procedures: 6/8>>  #1: Ultrasound-guided access, bilateral popliteal vein  #2: Intravascular ultrasound, bilateral femoral, common  femoral, external iliac, common iliac veins, and inferior vena cava #3: Placement of bilateral intravenous infusion catheter for thrombolytic therapy into the vena cava.                         #4: Initiation of thrombolytic therapy (TPA into the inferior vena cava, common iliac, external iliac, common femoral, and femoral veins  6/9>> 1.  Ultrasound-guided cannulation of right internal jugular vein 2.  Ultrasound-guided cannulation of left internal jugular vein 3.  Ultrasound-guided placement of 7 French double-lumen central venous line and left internal jugular vein 4.  Intravascular ultrasound of bilateral femoral, bilateral common femoral, bilateral external iliac and bilateral common iliac veins and IVC 5.  IVC venogram 6.  Angio VAC and cleaner wire mechanical thrombectomy of IVC, bilateral common iliac, bilateral external iliac, bilateral common femoral and bilateral femoral veins 7.  Stent of IVC with 18 x 90 mm Wallstent 8.  Ultrasound-guided cannulation right common femoral vein 9.  Veno venous bypass for 58 minutes  COVID-19  medications: Steroids:6/1>>6/9 Remdesivir:6/1>>6/6  Antibiotics: Zithromax: 6/1>> 6/4 Rocephin: 6/1>> 6/4  Microbiology data: 6/1>> blood culture: Negative 6/2>> urine culture: Negative  Consults: VVS CCS  DVT prophylaxis: Xarelto>> to IV heparin as n.p.o./NG tube in place   Subjective:   Passing flatus-but no BM.  Abdomen less distended following NG tube insertion on 6/17.   Assessment  & Plan :   Sepsis secondary to Covid 19 Viral pneumonia: Sepsis physiology has resolved-has completed a course of steroids/remdesivir.  Remains stable on room air.  Fever: afebrile  O2 requirements:  SpO2: 98 % O2 Flow Rate (L/min): 0 L/min   COVID-19 Labs: No results for input(s): DDIMER, FERRITIN, LDH, CRP in the last 72 hours.     Component Value Date/Time   BNP 29.9 03/21/2020 2148    No results for input(s): PROCALCITON in the last 168 hours.  Lab Results  Component Value Date   SARSCOV2NAA POSITIVE (A) 03/10/2020    Prone/Incentive Spirometry: encouraged incentive spirometry use 3-4/hour.  Extensive bilateral lower extremity DVTs-involving bilateral iliac veins and extending to IVC up to the level of IVC filter: s/p bilateral popliteal lytic catheter placement on 6/8-and mechanical thrombectomy/IVC filter on 6/9-vascular surgery following.  Was on argatroban due to thrombocytopenia-HIT antibody negative-platelet counts improving-had been transitioned to Xarelto on 6/14.  But due to ileus/SBO-placed back on IV heparin-with plans to transition back to Xarelto when SBO resolves.  Hypovolemic shock: Resolved-secondary to blood loss related to IVC tear during IVC filter removal-s/p stenting-resuscitated with IV fluids-briefly required Levophed while in the ICU.  BP now stable.  Acute blood loss anemia: Secondary to perioperative blood loss-see operative note-follow CBC-no indication for transfusion.  Hemoglobin slowly improving.  Thrombocytopenia: Continues to have stable  thrombocytopenia-HIT antibody negative.  Platelet count slowly improving-follow closely.  Small bowel obstruction/ileus: Continue NG tube decompression-n.p.o. status-the conservative measures-General surgery following.    AKI: Likely hemodynamically mediated-resolved  DM-2 (A1c 8.6) with uncontrolled hyperglycemia with mild DKA: CBG stable with SSI and Tradjenta.  Long-acting insulin discontinued on 6/14 as CBGs only in the 80s/90s.  Recent Labs    04/06/20 2106 04/07/20 0559 04/07/20 1155  GLUCAP 106* 103* 79    T7-T8 disc herniation: Prior MD-Dr. Waldron Labs already discussed MRI findings with Dr. Jenkins-recommendations were for outpatient follow-up.  Hypothyroidism: Continue Synthroid.  TSH mildly elevated-not sure when her Synthroid dosing was adjusted last-stable for outpatient follow-up/adjustment with PCP.  HLD: Continue statin  Constipation: Resolved-continue  MiraLAX/senna.  Deconditioning/debility: Secondary to acute illness/blood loss anemia/numerous procedures-has nausea/pain/tachycardia with ambulation -evaluated by rehab services-recommendations are for CIR  Nutrition Problem: Nutrition Problem: Increased nutrient needs Etiology: acute illness (COVID) Signs/Symptoms: estimated needs Interventions: Glucerna shake  ABG:    Component Value Date/Time   PHART 7.345 (L) 03/29/2020 1425   PCO2ART 42.2 03/29/2020 1425   PO2ART 200 (H) 03/29/2020 1425   HCO3 23.0 03/29/2020 1425   TCO2 24 03/29/2020 1425   ACIDBASEDEF 2.0 03/29/2020 1425   O2SAT 100.0 03/29/2020 1425    Vent Settings: N/A   Condition -Stable  Family Communication  : spouse over the phone 6/18  Code Status :  Full Code  Diet :  Diet Order            Diet NPO time specified Except for: Ice Chips  Diet effective now                  Disposition Plan  : Status is: Inpatient  Remains inpatient appropriate because:Inpatient level of care appropriate due to severity of illness  Dispo:  The patient is from: Home              Anticipated d/c is to: Home              Anticipated d/c date is: 2 days              Patient currently is not medically stable to d/c.  Barriers to discharge: Ileus/partial small bowel obstruction-requiring NG tube decompression-and other conservative measures.  Antimicorbials  :    Anti-infectives (From admission, onward)   Start     Dose/Rate Route Frequency Ordered Stop   03/23/20 2200  azithromycin (ZITHROMAX) tablet 500 mg  Status:  Discontinued        500 mg Oral Daily at bedtime 03/23/20 0806 03/25/20 1233   03/23/20 1000  remdesivir 100 mg in sodium chloride 0.9 % 100 mL IVPB       "Followed by" Linked Group Details   100 mg 200 mL/hr over 30 Minutes Intravenous Daily 03/22/20 0110 03/26/20 1913   03/22/20 2200  azithromycin (ZITHROMAX) 500 mg in sodium chloride 0.9 % 250 mL IVPB  Status:  Discontinued        500 mg 250 mL/hr over 60 Minutes Intravenous Every 24 hours 03/22/20 0111 03/23/20 0806   03/22/20 2200  cefTRIAXone (ROCEPHIN) 1 g in sodium chloride 0.9 % 100 mL IVPB  Status:  Discontinued        1 g 200 mL/hr over 30 Minutes Intravenous Every 24 hours 03/22/20 0111 03/25/20 1233   03/22/20 0200  remdesivir 200 mg in sodium chloride 0.9% 250 mL IVPB       "Followed by" Linked Group Details   200 mg 580 mL/hr over 30 Minutes Intravenous Once 03/22/20 0110 03/22/20 1900   03/22/20 0015  cefTRIAXone (ROCEPHIN) 1 g in sodium chloride 0.9 % 100 mL IVPB        1 g 200 mL/hr over 30 Minutes Intravenous  Once 03/22/20 0010 03/22/20 0103   03/21/20 2300  cefTRIAXone (ROCEPHIN) injection 1 g  Status:  Discontinued        1 g Intramuscular  Once 03/21/20 2253 03/22/20 0010   03/21/20 2300  azithromycin (ZITHROMAX) 500 mg in sodium chloride 0.9 % 250 mL IVPB        500 mg 250 mL/hr over 60 Minutes Intravenous  Once 03/21/20 2253 03/22/20 0129      Inpatient Medications  Scheduled Meds: . Chlorhexidine Gluconate Cloth  6 each Topical  Daily  . insulin aspart  0-15 Units Subcutaneous TID WC  . insulin aspart  0-5 Units Subcutaneous QHS  . levothyroxine  50 mcg Intravenous Daily  . mupirocin ointment   Topical BID  . pantoprazole (PROTONIX) IV  40 mg Intravenous Q24H  . sodium chloride flush  10-40 mL Intracatheter Q12H  . sodium chloride flush  3 mL Intravenous Q12H   Continuous Infusions: . sodium chloride Stopped (03/31/20 0846)  . sodium chloride 75 mL/hr at 04/07/20 1240  . heparin 1,000 Units/hr (04/07/20 0314)  . sodium chloride     PRN Meds:.sodium chloride, acetaminophen, albuterol, guaiFENesin-dextromethorphan, morphine injection, ondansetron **OR** ondansetron (ZOFRAN) IV, promethazine, sodium chloride, sodium chloride flush, zolpidem   Time Spent in minutes  25  See all Orders from today for further details   Oren Binet M.D on 04/07/2020 at 1:39 PM  To page go to [email or web address] - use universal password  [facility] -  Office  [phone]    Objective:   Vitals:   04/07/20 0309 04/07/20 0500 04/07/20 0813 04/07/20 1157  BP: 135/75  135/80 123/85  Pulse: 85  82 82  Resp: 16  18 16   Temp: 98.4 F (36.9 C)  98.5 F (36.9 C) 98.8 F (37.1 C)  TempSrc: Oral  Oral Oral  SpO2: 98%  98% 98%  Weight:  101.4 kg    Height:        Wt Readings from Last 3 Encounters:  04/07/20 101.4 kg  02/15/20 91.2 kg  08/02/19 95.3 kg     Intake/Output Summary (Last 24 hours) at 04/07/2020 1339 Last data filed at 04/07/2020 1158 Gross per 24 hour  Intake 3122.67 ml  Output 2100 ml  Net 1022.67 ml    Physical Exam Gen Exam:Alert awake-not in any distress HEENT:atraumatic, normocephalic Chest: B/L clear to auscultation anteriorly CVS:S1S2 regular Abdomen:soft non tender, non distended Extremities:no edema Neurology: Non focal Skin: no rash   Data Review:    CBC Recent Labs  Lab 04/02/20 0422 04/03/20 0510 04/04/20 0238 04/05/20 0351 04/07/20 0156  WBC 6.9 8.2 11.5* 12.5* 9.0    HGB 8.7* 9.6* 9.8* 10.0* 9.6*  HCT 26.2* 28.2* 30.3* 30.8* 29.8*  PLT 81* 99* 114* 150 206  MCV 89.7 91.0 92.1 92.8 92.0  MCH 29.8 31.0 29.8 30.1 29.6  MCHC 33.2 34.0 32.3 32.5 32.2  RDW 16.1* 16.5* 16.7* 17.0* 16.0*    Chemistries  Recent Labs  Lab 04/06/20 0855 04/07/20 0156  NA 136 139  K 4.2 4.3  CL 98 102  CO2 28 27  GLUCOSE 113* 119*  BUN 14 10  CREATININE 0.91 0.91  CALCIUM 9.0 8.6*  AST 23  --   ALT 25  --   ALKPHOS 76  --   BILITOT 2.5*  --    ------------------------------------------------------------------------------------------------------------------ No results for input(s): CHOL, HDL, LDLCALC, TRIG, CHOLHDL, LDLDIRECT in the last 72 hours.  Lab Results  Component Value Date   HGBA1C 8.6 (H) 03/23/2020   ------------------------------------------------------------------------------------------------------------------ No results for input(s): TSH, T4TOTAL, T3FREE, THYROIDAB in the last 72 hours.  Invalid input(s): FREET3 ------------------------------------------------------------------------------------------------------------------ No results for input(s): VITAMINB12, FOLATE, FERRITIN, TIBC, IRON, RETICCTPCT in the last 72 hours.  Coagulation profile No results for input(s): INR, PROTIME in the last 168 hours.  No results for input(s): DDIMER in the last 72 hours.  Cardiac Enzymes No results for input(s): CKMB, TROPONINI, MYOGLOBIN in the last 168 hours.  Invalid input(s): CK ------------------------------------------------------------------------------------------------------------------  Component Value Date/Time   BNP 29.9 03/21/2020 2148    Micro Results No results found for this or any previous visit (from the past 240 hour(s)).  Radiology Reports CT ABDOMEN PELVIS WO CONTRAST  Result Date: 04/06/2020 CLINICAL DATA:  Nausea and vomiting, intermittent for several days, sepsis, abdominal distension, recent procedure for removal of  IVC filter/thrombectomy with development of IVC perforation EXAM: CT ABDOMEN AND PELVIS WITHOUT CONTRAST TECHNIQUE: Multidetector CT imaging of the abdomen and pelvis was performed following the standard protocol without IV contrast. COMPARISON:  04/06/2020 FINDINGS: Lower chest: Trace left pleural effusion. Minimal dependent bilateral lower lobe atelectasis. Hepatobiliary: The dome of the liver is excluded by slice selection. Visualized portions of the unenhanced liver unremarkable. Gallbladder is decompressed, without focal abnormality. Pancreas: Unremarkable. No pancreatic ductal dilatation or surrounding inflammatory changes. Spleen: Normal in size without focal abnormality. Adrenals/Urinary Tract: No urinary tract calculi. There is mild distension of the renal pelves and ureters, likely due to significant bladder distension. The adrenals are unremarkable. Stomach/Bowel: There is evidence of high-grade small bowel obstruction. There is marked distension of the duodenum and jejunum. The distal jejunum and ileum are decompressed. Exact point of transition is not well visualized. Colon is decompressed. No bowel wall thickening Vascular/Lymphatic: Since the prior exam, the IVC filter has been removed and a stent placed within the IVC. There is evidence of retroperitoneal hematoma related to known IVC perforation intraoperatively. This measures up to 2.5 cm in thickness. Based on size, this is likely the sequela of the intraoperative perforation and there is no significant mass effect to suggest ongoing bleeding. Evaluation is limited without IV contrast. Minimal atherosclerosis of the aorta. No pathologic adenopathy. Reproductive: Uterus and bilateral adnexa are unremarkable. Other: There is no free intraperitoneal fluid or free gas. No abdominal wall hernia. Musculoskeletal: No acute or destructive bony lesions. Reconstructed images demonstrate no additional findings. IMPRESSION: 1. High-grade small bowel  obstruction. Exact point of transition not well visualized, likely in the distal jejunum or proximal ileum. 2. Right-sided retroperitoneal hematoma, likely due to the intraoperative IVC perforation during IVC filter removal. IVC stent placed in the interim. 3. Trace left pleural effusion. Electronically Signed   By: Randa Ngo M.D.   On: 04/06/2020 16:00   DG Abd 1 View  Result Date: 04/06/2020 CLINICAL DATA:  Nasogastric tube placement EXAM: ABDOMEN - 1 VIEW COMPARISON:  CT abdomen and pelvis April 06, 2020 FINDINGS: Nasogastric tube tip and side port in stomach. There are multiple loops of mildly dilated bowel. No free air evident. Lung bases clear. Stent noted in inferior vena cava region. IMPRESSION: Nasogastric tube tip and side port in stomach. Loops of mildly dilated bowel, slightly less pronounced compared to earlier in the day. No free air evident. Lung bases clear. Electronically Signed   By: Lowella Grip III M.D.   On: 04/06/2020 17:59   CT Angio Chest PE W and/or Wo Contrast  Result Date: 03/22/2020 CLINICAL DATA:  Severe back pain hypotension and shallow EXAM: CT ANGIOGRAPHY CHEST WITH CONTRAST TECHNIQUE: Multidetector CT imaging of the chest was performed using the standard protocol during bolus administration of intravenous contrast. Multiplanar CT image reconstructions and MIPs were obtained to evaluate the vascular anatomy. CONTRAST:  22mL OMNIPAQUE IOHEXOL 350 MG/ML SOLN COMPARISON:  None. FINDINGS: Cardiovascular: There is a optimal opacification of the pulmonary arteries. There is no central,segmental, or subsegmental filling defects within the pulmonary arteries. The heart is normal in size. No pericardial effusion or thickening. No evidence right  heart strain. There is normal three-vessel brachiocephalic anatomy without proximal stenosis. The thoracic aorta is normal in appearance. Mediastinum/Nodes: No hilar, mediastinal, or axillary adenopathy. Thyroid gland, trachea, and  esophagus demonstrate no significant findings. Lungs/Pleura: Multifocal patchy ground-glass opacities are seen predominantly within the periphery of both lungs. No pleural effusion or pneumothorax is seen. Upper Abdomen: No acute abnormalities present in the visualized portions of the upper abdomen. Musculoskeletal: No chest wall abnormality. No acute or significant osseous findings. Review of the MIP images confirms the above findings. Abdomen/pelvis: Hepatobiliary: There is diffuse low density seen throughout the liver parenchyma. The main portal vein is patent. No evidence of calcified gallstones, gallbladder wall thickening or biliary dilatation. Pancreas: Unremarkable. No pancreatic ductal dilatation or surrounding inflammatory changes. Spleen: Normal in size without focal abnormality. Adrenals/Urinary Tract: Both adrenal glands appear normal. The kidneys and collecting system appear normal without evidence of urinary tract calculus or hydronephrosis. Bladder is unremarkable. Stomach/Bowel: The stomach, small bowel, and colon are normal in appearance. No inflammatory changes, wall thickening, or obstructive findings. There appears to be a surgical anastomosis at the sigmoid rectal junction. Mild presacral soft tissue density is again identified. Vascular/Lymphatic: There are no enlarged mesenteric, retroperitoneal, or pelvic lymph nodes. An infrarenal IVC filter is seen at approximately the L3 level. There is scattered aortic and bi-iliac calcifications noted. Reproductive: The uterus and adnexa are unremarkable. Other: No evidence of abdominal wall mass or hernia. Musculoskeletal: No acute or significant osseous findings. IMPRESSION: No central, segmental, or subsegmental pulmonary embolism. Multifocal patchy airspace opacities throughout both lungs, likely consistent with multifocal pneumonia. Hepatic steatosis Aortic Atherosclerosis (ICD10-I70.0). Electronically Signed   By: Prudencio Pair M.D.   On:  03/22/2020 00:07   MR THORACIC SPINE W WO CONTRAST  Result Date: 03/22/2020 CLINICAL DATA:  Bilateral lower extremity pain and numbness with urinary retention. Diabetes. History of colon cancer. EXAM: MRI THORACIC WITHOUT AND WITH CONTRAST TECHNIQUE: Multiplanar and multiecho pulse sequences of the thoracic spine were obtained without and with intravenous contrast. CONTRAST:  9.21mL GADAVIST GADOBUTROL 1 MMOL/ML IV SOLN COMPARISON:  CT chest done yesterday. FINDINGS: MRI THORACIC SPINE FINDINGS Alignment:  Normal Vertebrae: Normal. No fracture or primary bone lesion. No metastatic disease. Insignificant small hemangioma within the T11 vertebral body. Cord: No primary cord pathology. See below regarding stenosis at T7-8. Paraspinal and other soft tissues: Negative Disc levels: No abnormality at T5-6 or above. T6-7: Small right paracentral disc protrusion indents the thecal sac slightly but does not cause apparent neural compression. T7-8: Left posterolateral disc herniation effaces the ventral subarachnoid space and indents the left side of the cord. This could be symptomatic. T8-9 through T12-L1: Normal. 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 Ample subarachnoid space is present dorsal to the cord at this level. None the less, this could possibly be symptomatic. Small right paracentral disc herniation at T6-7 without apparent neural compression. Electronically Signed   By: Nelson Chimes M.D.   On: 03/22/2020 12:32   MR Lumbar Spine W Wo Contrast  Result Date: 03/22/2020 CLINICAL DATA:  Back pain.  Urinary retention.  Diabetes. EXAM: MRI LUMBAR SPINE WITHOUT AND WITH CONTRAST TECHNIQUE: Multiplanar and multiecho pulse sequences of the lumbar spine were obtained without and with intravenous contrast. CONTRAST:  9.57mL GADAVIST GADOBUTROL 1 MMOL/ML IV SOLN COMPARISON:  None. FINDINGS: Segmentation:   5 lumbar type vertebral bodies. Alignment:  Slightly exaggerated lower  lumbar lordosis. Vertebrae:  No fracture or primary bone lesion. Conus medullaris and cauda equina: Conus extends to the L1 level. Conus and cauda equina appear normal. Paraspinal and other soft tissues: There appears be thrombosis of the IVC and both iliac veins up to the level of the IVC filter. Flow is restored in the IVC by inflow from the renal veins. Disc levels: No abnormality from L1-2 through L2-3. L3-4: No disc abnormality.  Mild facet hypertrophy.  No stenosis. L4-5: No disc abnormality. Mild to moderate facet hypertrophy. No stenosis. L5-S1: Minimal disc bulge.  Mild facet hypertrophy.  No stenosis. IMPRESSION: No significant disc pathology. No stenosis or neural compression in the lumbar region. Patient has exaggerated lumbar lordosis. 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 Flow is restored in the IVC above that by inflow from the renal veins. I am in the process of calling this report. Electronically Signed   By: Nelson Chimes M.D.   On: 03/22/2020 12:46   CT ABDOMEN PELVIS W CONTRAST  Result Date: 03/22/2020 CLINICAL DATA:  Severe back pain hypotension and shallow EXAM: CT ANGIOGRAPHY CHEST WITH CONTRAST TECHNIQUE: Multidetector CT imaging of the chest was performed using the standard protocol during bolus administration of intravenous contrast. Multiplanar CT image reconstructions and MIPs were obtained to evaluate the vascular anatomy. CONTRAST:  15mL OMNIPAQUE IOHEXOL 350 MG/ML SOLN COMPARISON:  None. FINDINGS: Cardiovascular: There is a optimal opacification of the pulmonary arteries. There is no central,segmental, or subsegmental filling defects within the pulmonary arteries. The heart is normal in size. No pericardial effusion or  thickening. No evidence right heart strain. There is normal three-vessel brachiocephalic anatomy without proximal stenosis. The thoracic aorta is normal in appearance. Mediastinum/Nodes: No hilar, mediastinal, or axillary adenopathy. Thyroid gland, trachea, and esophagus demonstrate no significant findings. Lungs/Pleura: Multifocal patchy ground-glass opacities are seen predominantly within the periphery of both lungs. No pleural effusion or pneumothorax is seen. Upper Abdomen: No acute abnormalities present in the visualized portions of the upper abdomen. Musculoskeletal: No chest wall abnormality. No acute or significant osseous findings. Review of the MIP images confirms the above findings. Abdomen/pelvis: Hepatobiliary: There is diffuse low density seen throughout the liver parenchyma. The main portal vein is patent. No evidence of calcified gallstones, gallbladder wall thickening or biliary dilatation. Pancreas: Unremarkable. No pancreatic ductal dilatation or surrounding inflammatory changes. Spleen: Normal in size without focal abnormality. Adrenals/Urinary Tract: Both adrenal glands appear normal. The kidneys and collecting system appear normal without evidence of urinary tract calculus or hydronephrosis. Bladder is unremarkable. Stomach/Bowel: The stomach, small bowel, and colon are normal in appearance. No inflammatory changes, wall thickening, or obstructive findings. There appears to be a surgical anastomosis at the sigmoid rectal junction. Mild presacral soft tissue density is again identified. Vascular/Lymphatic: There are no enlarged mesenteric, retroperitoneal, or pelvic lymph nodes. An infrarenal IVC filter is seen at approximately the L3 level. There is scattered aortic and bi-iliac calcifications noted. Reproductive: The uterus and adnexa are unremarkable. Other: No evidence of abdominal wall mass or hernia. Musculoskeletal: No acute or significant osseous findings. IMPRESSION: No central,  segmental, or subsegmental pulmonary embolism. Multifocal patchy airspace opacities throughout both lungs, likely consistent with multifocal pneumonia. Hepatic steatosis Aortic Atherosclerosis (ICD10-I70.0). Electronically Signed   By: Prudencio Pair M.D.   On: 03/22/2020  00:07   DG Chest Port 1 View  Result Date: 03/21/2020 CLINICAL DATA:  54 year old female with weakness and hypotension. EXAM: PORTABLE CHEST 1 VIEW COMPARISON:  Chest radiograph dated 03/25/2016 FINDINGS: Faint bilateral peripheral and subpleural densities, right greater left concerning for pneumonia, possibly atypical or viral in etiology including COVID-19. Clinical correlation is recommended. No focal consolidation, pleural effusion, pneumothorax. The cardiac silhouette is within limits. No acute osseous pathology. IMPRESSION: Findings concerning for pneumonia. Electronically Signed   By: Anner Crete M.D.   On: 03/21/2020 22:17   DG Abd 2 Views  Result Date: 04/06/2020 CLINICAL DATA:  Abdominal pain and nausea as well as constipation today. EXAM: ABDOMEN - 2 VIEW COMPARISON:  CT 03/21/2020 FINDINGS: Examination demonstrates several air-filled dilated centralized small bowel loops measuring up to 5.7 cm in diameter. No free peritoneal air. Mild air and stool within the colon. Several differential air-fluid levels are present on the decubitus film. Stent present over the mid to lower abdomen just right of midline. Remaining bones and soft tissues are unremarkable. IMPRESSION: Findings likely due to small-bowel obstruction and less likely ileus. Electronically Signed   By: Marin Olp M.D.   On: 04/06/2020 11:05   VAS [place] LOWER EXTREMITY VENOUS (DVT)  Result Date: 03/22/2020  Lower Venous DVTStudy Indications: Hx DVT and IVC filter, now with COVID and elevated D dimer.  Comparison Study: 06/07/15 negative Performing Technologist: June Leap RDMS, RVT  Examination Guidelines: A complete evaluation includes B-mode imaging, spectral  Doppler, color Doppler, and power Doppler as needed of all accessible portions of each vessel. Bilateral testing is considered an integral part of a complete examination. Limited examinations for reoccurring indications may be performed as noted. The reflux portion of the exam is performed with the patient in reverse Trendelenburg.  +---------+---------------+---------+-----------+----------+-----------------+ RIGHT    CompressibilityPhasicitySpontaneityPropertiesThrombus Aging    +---------+---------------+---------+-----------+----------+-----------------+ CFV      None           No       No                   Acute             +---------+---------------+---------+-----------+----------+-----------------+ SFJ      None                                         Acute             +---------+---------------+---------+-----------+----------+-----------------+ FV Prox  None                                         Acute             +---------+---------------+---------+-----------+----------+-----------------+ FV Mid   None                                         Acute             +---------+---------------+---------+-----------+----------+-----------------+ FV DistalNone                                         Acute             +---------+---------------+---------+-----------+----------+-----------------+  PFV      None                                         Acute             +---------+---------------+---------+-----------+----------+-----------------+ POP      None           No       No                   Acute             +---------+---------------+---------+-----------+----------+-----------------+ PTV      None                                         Acute             +---------+---------------+---------+-----------+----------+-----------------+ PERO     None                                         Acute              +---------+---------------+---------+-----------+----------+-----------------+ Gastroc  None                                         Acute             +---------+---------------+---------+-----------+----------+-----------------+ GSV      None                                         Acute             +---------+---------------+---------+-----------+----------+-----------------+ Ex Iliac                No       No                   Age Indeterminate +---------+---------------+---------+-----------+----------+-----------------+ Com Iliac                                             Not visualized    +---------+---------------+---------+-----------+----------+-----------------+   +---------+---------------+---------+-----------+----------+-----------------+ LEFT     CompressibilityPhasicitySpontaneityPropertiesThrombus Aging    +---------+---------------+---------+-----------+----------+-----------------+ CFV      None           No       No                   Acute             +---------+---------------+---------+-----------+----------+-----------------+ SFJ      None                                         Acute             +---------+---------------+---------+-----------+----------+-----------------+ FV Prox  None  Acute             +---------+---------------+---------+-----------+----------+-----------------+ FV Mid   None                                         Acute             +---------+---------------+---------+-----------+----------+-----------------+ FV DistalNone                                         Acute             +---------+---------------+---------+-----------+----------+-----------------+ PFV      None                                         Acute             +---------+---------------+---------+-----------+----------+-----------------+ POP      None           No       No                    Acute             +---------+---------------+---------+-----------+----------+-----------------+ PTV      None                                         Acute             +---------+---------------+---------+-----------+----------+-----------------+ PERO     None                                         Acute             +---------+---------------+---------+-----------+----------+-----------------+ Gastroc  None                                         Acute             +---------+---------------+---------+-----------+----------+-----------------+ GSV      None                                         Acute             +---------+---------------+---------+-----------+----------+-----------------+ Ex Iliac                No       No                   Age Indeterminate +---------+---------------+---------+-----------+----------+-----------------+ Com Iliac                                             Not visualized    +---------+---------------+---------+-----------+----------+-----------------+     Summary: RIGHT: - Findings consistent with acute deep vein thrombosis involving  the right common femoral vein, SF junction, right femoral vein, right proximal profunda vein, right popliteal vein, right posterior tibial veins, right peroneal veins, and right gastrocnemius veins. - Findings consistent with acute superficial vein thrombosis involving the right great saphenous vein. - Thrombosis extends into the Right Iliac vein and IVC up to the level of the filter placement.  LEFT: - Findings consistent with acute deep vein thrombosis involving the left common femoral vein, SF junction, left femoral vein, left proximal profunda vein, left popliteal vein, left posterior tibial veins, left peroneal veins, and left gastrocnemius veins. - Findings consistent with acute superficial vein thrombosis involving the left great saphenous vein. - Thrombosis extends into the Left Iliac vein.   *See table(s) above for measurements and observations. Electronically signed by Curt Jews MD on 03/22/2020 at 5:19:17 PM.    Final    HYBRID OR IMAGING ([place])  Result Date: 03/29/2020 There is no interpretation for this exam.  This order is for images obtained during a surgical procedure.  Please See "Surgeries" Tab for more information regarding the procedure.   HYBRID OR IMAGING ([facility] ONLY)  Result Date: 03/28/2020 There is no interpretation for this exam.  This order is for images obtained during a surgical procedure.  Please See "Surgeries" Tab for more information regarding the procedure.

## 2020-04-07 NOTE — Progress Notes (Addendum)
Progress Note    04/07/2020 7:37 AM 9 Days Post-Op  Subjective: Feels better this morning after nasogastric intubation and decompression.  She continues to pass gas   Vitals:   04/06/20 2310 04/07/20 0309  BP: 120/75 135/75  Pulse: 85 85  Resp:  16  Temp: 98.1 F (36.7 C) 98.4 F (36.9 C)  SpO2: 96% 98%    Physical Exam: Cardiac: Rate and rhythm are regular Lungs: Clear to auscultation bilaterally Extremities: Compression stockings in place with mild ankle edema Abdomen: Mild distention, hypoactive bowel sounds Nasogastric tube output: 2100 cc CBC    Component Value Date/Time   WBC 9.0 04/07/2020 0156   RBC 3.24 (L) 04/07/2020 0156   HGB 9.6 (L) 04/07/2020 0156   HGB 13.3 12/17/2013 1538   HCT 29.8 (L) 04/07/2020 0156   HCT 40.6 12/17/2013 1538   PLT 206 04/07/2020 0156   PLT 219 12/17/2013 1538   MCV 92.0 04/07/2020 0156   MCV 90.3 12/17/2013 1538   MCH 29.6 04/07/2020 0156   MCHC 32.2 04/07/2020 0156   RDW 16.0 (H) 04/07/2020 0156   RDW 13.5 12/17/2013 1538   LYMPHSABS 1.1 03/30/2020 0922   LYMPHSABS 1.4 12/17/2013 1538   MONOABS 0.6 03/30/2020 0922   MONOABS 0.3 12/17/2013 1538   EOSABS 0.0 03/30/2020 0922   EOSABS 0.0 12/17/2013 1538   BASOSABS 0.0 03/30/2020 0922   BASOSABS 0.0 12/17/2013 1538    BMET    Component Value Date/Time   NA 139 04/07/2020 0156   NA 144 01/21/2018 1000   NA 142 12/17/2013 1538   K 4.3 04/07/2020 0156   K 4.1 12/17/2013 1538   CL 102 04/07/2020 0156   CL 106 12/07/2012 0838   CO2 27 04/07/2020 0156   CO2 28 12/17/2013 1538   GLUCOSE 119 (H) 04/07/2020 0156   GLUCOSE 123 12/17/2013 1538   GLUCOSE 119 (H) 12/07/2012 0838   BUN 10 04/07/2020 0156   BUN 12 01/21/2018 1000   BUN 14.1 12/17/2013 1538   CREATININE 0.91 04/07/2020 0156   CREATININE 0.96 10/27/2015 0904   CREATININE 0.8 12/17/2013 1538   CALCIUM 8.6 (L) 04/07/2020 0156   CALCIUM 9.7 12/17/2013 1538   GFRNONAA >60 04/07/2020 0156   GFRAA >60  04/07/2020 0156     Intake/Output Summary (Last 24 hours) at 04/07/2020 0737 Last data filed at 04/07/2020 0600 Gross per 24 hour  Intake 3242.67 ml  Output 2600 ml  Net 642.67 ml    HOSPITAL MEDICATIONS Scheduled Meds: . Chlorhexidine Gluconate Cloth  6 each Topical Daily  . insulin aspart  0-15 Units Subcutaneous TID WC  . insulin aspart  0-5 Units Subcutaneous QHS  . levothyroxine  50 mcg Intravenous Daily  . multivitamin with minerals  1 tablet Oral Daily  . mupirocin ointment   Topical BID  . pantoprazole (PROTONIX) IV  40 mg Intravenous Q24H  . sodium chloride flush  10-40 mL Intracatheter Q12H  . sodium chloride flush  3 mL Intravenous Q12H  . zinc sulfate  220 mg Oral Daily   Continuous Infusions: . sodium chloride Stopped (03/31/20 0846)  . sodium chloride 75 mL/hr at 04/07/20 0600  . heparin 1,000 Units/hr (04/07/20 0314)  . sodium chloride     PRN Meds:.sodium chloride, acetaminophen, albuterol, guaiFENesin-dextromethorphan, morphine injection, ondansetron **OR** ondansetron (ZOFRAN) IV, promethazine, sodium chloride, sodium chloride flush, zolpidem   CT scan abd/pelvis 04/06/2020  IMPRESSION: 1. High-grade small bowel obstruction. Exact point of transition not well visualized, likely in the distal  jejunum or proximal ileum. 2. Right-sided retroperitoneal hematoma, likely due to the intraoperative IVC perforation during IVC filter removal. IVC stent placed in the interim. 3. Trace left pleural effusion.  Assessment: Status post angio VAC and cleaner wire mechanical thrombectomy of IVC, bilateral common iliac, bilateral external iliac, bilateral common femoral and bilateral femoral veins;Stent of IVC with 18 x 90 mm Wallstent.  Subsequent retroperitoneal bleed and development of ileus.  Her hemoglobin has been stable over the past 3 days.  She has had over 2 L NG output since placement of NG tube yesterday afternoon.  White blood cell count normal today.  She  remains hemodynamically stable  Plan: -Continue nasogastric tube, IV fluids, monitor electrolytes.  General surgery following. -Continue compression stockings -DVT prophylaxis: Heparin infusion    Risa Grill, PA-C [facility] [phone] 04/07/2020  7:37 AM   I have independently interviewed and examined patient and agree with PA assessment and plan above.   Tremond Shimabukuro C. Donzetta Matters, MD [facility] [place] Office: [phone] Pager: [phone]

## 2020-04-07 NOTE — Progress Notes (Signed)
Patient ID: [name], female   DOB: [birth year], 54 y.o.   MRN: [id number]    9 Days Post-Op  Subjective: Feels better today.  Still passing flatus.  NGT with 2.1L out since placement.  No abdominal pain today.  ROS: See above, otherwise other systems negative  Objective: Vital signs in last 24 hours: Temp:  [97.6 F (36.4 C)-99.4 F (37.4 C)] 98.4 F (36.9 C) (06/18 0309) Pulse Rate:  [76-87] 85 (06/18 0309) Resp:  [15-16] 16 (06/18 0309) BP: (120-143)/(75-86) 135/75 (06/18 0309) SpO2:  [95 %-98 %] 98 % (06/18 0309) Weight:  [101.4 kg] 101.4 kg (06/18 0500) Last BM Date: 04/05/20  Intake/Output from previous day: 06/17 0701 - 06/18 0700 In: 3242.7 [P.O.:120; I.V.:3122.7] Out: 2600 [Urine:500; Emesis/NG output:2100] Intake/Output this shift: No intake/output data recorded.  PE: Abd: softer, less bloated, some BS, NGT with bilious output present  Lab Results:  Recent Labs    04/05/20 0351 04/07/20 0156  WBC 12.5* 9.0  HGB 10.0* 9.6*  HCT 30.8* 29.8*  PLT 150 206   BMET Recent Labs    04/06/20 0855 04/07/20 0156  NA 136 139  K 4.2 4.3  CL 98 102  CO2 28 27  GLUCOSE 113* 119*  BUN 14 10  CREATININE 0.91 0.91  CALCIUM 9.0 8.6*   PT/INR No results for input(s): LABPROT, INR in the last 72 hours. CMP     Component Value Date/Time   NA 139 04/07/2020 0156   NA 144 01/21/2018 1000   NA 142 12/17/2013 1538   K 4.3 04/07/2020 0156   K 4.1 12/17/2013 1538   CL 102 04/07/2020 0156   CL 106 12/07/2012 0838   CO2 27 04/07/2020 0156   CO2 28 12/17/2013 1538   GLUCOSE 119 (H) 04/07/2020 0156   GLUCOSE 123 12/17/2013 1538   GLUCOSE 119 (H) 12/07/2012 0838   BUN 10 04/07/2020 0156   BUN 12 01/21/2018 1000   BUN 14.1 12/17/2013 1538   CREATININE 0.91 04/07/2020 0156   CREATININE 0.96 10/27/2015 0904   CREATININE 0.8 12/17/2013 1538   CALCIUM 8.6 (L) 04/07/2020 0156   CALCIUM 9.7 12/17/2013 1538   PROT 6.3 (L) 04/06/2020 0855   PROT 7.7 12/17/2013 1538    ALBUMIN 2.8 (L) 04/06/2020 0855   ALBUMIN 4.0 12/17/2013 1538   AST 23 04/06/2020 0855   AST 15 12/17/2013 1538   ALT 25 04/06/2020 0855   ALT 19 12/17/2013 1538   ALKPHOS 76 04/06/2020 0855   ALKPHOS 147 12/17/2013 1538   BILITOT 2.5 (H) 04/06/2020 0855   BILITOT 0.30 12/17/2013 1538   GFRNONAA >60 04/07/2020 0156   GFRAA >60 04/07/2020 0156   Lipase     Component Value Date/Time   LIPASE 24 04/06/2020 0855       Studies/Results: CT ABDOMEN PELVIS WO CONTRAST  Result Date: 04/06/2020 CLINICAL DATA:  Nausea and vomiting, intermittent for several days, sepsis, abdominal distension, recent procedure for removal of IVC filter/thrombectomy with development of IVC perforation EXAM: CT ABDOMEN AND PELVIS WITHOUT CONTRAST TECHNIQUE: Multidetector CT imaging of the abdomen and pelvis was performed following the standard protocol without IV contrast. COMPARISON:  04/06/2020 FINDINGS: Lower chest: Trace left pleural effusion. Minimal dependent bilateral lower lobe atelectasis. Hepatobiliary: The dome of the liver is excluded by slice selection. Visualized portions of the unenhanced liver unremarkable. Gallbladder is decompressed, without focal abnormality. Pancreas: Unremarkable. No pancreatic ductal dilatation or surrounding inflammatory changes. Spleen: Normal in size without focal abnormality. Adrenals/Urinary Tract: No  urinary tract calculi. There is mild distension of the renal pelves and ureters, likely due to significant bladder distension. The adrenals are unremarkable. Stomach/Bowel: There is evidence of high-grade small bowel obstruction. There is marked distension of the duodenum and jejunum. The distal jejunum and ileum are decompressed. Exact point of transition is not well visualized. Colon is decompressed. No bowel wall thickening Vascular/Lymphatic: Since the prior exam, the IVC filter has been removed and a stent placed within the IVC. There is evidence of retroperitoneal hematoma  related to known IVC perforation intraoperatively. This measures up to 2.5 cm in thickness. Based on size, this is likely the sequela of the intraoperative perforation and there is no significant mass effect to suggest ongoing bleeding. Evaluation is limited without IV contrast. Minimal atherosclerosis of the aorta. No pathologic adenopathy. Reproductive: Uterus and bilateral adnexa are unremarkable. Other: There is no free intraperitoneal fluid or free gas. No abdominal wall hernia. Musculoskeletal: No acute or destructive bony lesions. Reconstructed images demonstrate no additional findings. IMPRESSION: 1. High-grade small bowel obstruction. Exact point of transition not well visualized, likely in the distal jejunum or proximal ileum. 2. Right-sided retroperitoneal hematoma, likely due to the intraoperative IVC perforation during IVC filter removal. IVC stent placed in the interim. 3. Trace left pleural effusion. Electronically Signed   By: Randa Ngo M.D.   On: 04/06/2020 16:00   DG Abd 1 View  Result Date: 04/06/2020 CLINICAL DATA:  Nasogastric tube placement EXAM: ABDOMEN - 1 VIEW COMPARISON:  CT abdomen and pelvis April 06, 2020 FINDINGS: Nasogastric tube tip and side port in stomach. There are multiple loops of mildly dilated bowel. No free air evident. Lung bases clear. Stent noted in inferior vena cava region. IMPRESSION: Nasogastric tube tip and side port in stomach. Loops of mildly dilated bowel, slightly less pronounced compared to earlier in the day. No free air evident. Lung bases clear. Electronically Signed   By: Lowella Grip III M.D.   On: 04/06/2020 17:59   DG Abd 2 Views  Result Date: 04/06/2020 CLINICAL DATA:  Abdominal pain and nausea as well as constipation today. EXAM: ABDOMEN - 2 VIEW COMPARISON:  CT 03/21/2020 FINDINGS: Examination demonstrates several air-filled dilated centralized small bowel loops measuring up to 5.7 cm in diameter. No free peritoneal air. Mild air and  stool within the colon. Several differential air-fluid levels are present on the decubitus film. Stent present over the mid to lower abdomen just right of midline. Remaining bones and soft tissues are unremarkable. IMPRESSION: Findings likely due to small-bowel obstruction and less likely ileus. Electronically Signed   By: Marin Olp M.D.   On: 04/06/2020 11:05    Anti-infectives: Anti-infectives (From admission, onward)   Start     Dose/Rate Route Frequency Ordered Stop   03/23/20 2200  azithromycin (ZITHROMAX) tablet 500 mg  Status:  Discontinued        500 mg Oral Daily at bedtime 03/23/20 0806 03/25/20 1233   03/23/20 1000  remdesivir 100 mg in sodium chloride 0.9 % 100 mL IVPB       "Followed by" Linked Group Details   100 mg 200 mL/hr over 30 Minutes Intravenous Daily 03/22/20 0110 03/26/20 1913   03/22/20 2200  azithromycin (ZITHROMAX) 500 mg in sodium chloride 0.9 % 250 mL IVPB  Status:  Discontinued        500 mg 250 mL/hr over 60 Minutes Intravenous Every 24 hours 03/22/20 0111 03/23/20 0806   03/22/20 2200  cefTRIAXone (ROCEPHIN) 1 g in  sodium chloride 0.9 % 100 mL IVPB  Status:  Discontinued        1 g 200 mL/hr over 30 Minutes Intravenous Every 24 hours 03/22/20 0111 03/25/20 1233   03/22/20 0200  remdesivir 200 mg in sodium chloride 0.9% 250 mL IVPB       "Followed by" Linked Group Details   200 mg 580 mL/hr over 30 Minutes Intravenous Once 03/22/20 0110 03/22/20 1900   03/22/20 0015  cefTRIAXone (ROCEPHIN) 1 g in sodium chloride 0.9 % 100 mL IVPB        1 g 200 mL/hr over 30 Minutes Intravenous  Once 03/22/20 0010 03/22/20 0103   03/21/20 2300  cefTRIAXone (ROCEPHIN) injection 1 g  Status:  Discontinued        1 g Intramuscular  Once 03/21/20 2253 03/22/20 0010   03/21/20 2300  azithromycin (ZITHROMAX) 500 mg in sodium chloride 0.9 % 250 mL IVPB        500 mg 250 mL/hr over 60 Minutes Intravenous  Once 03/21/20 2253 03/22/20 0129       Assessment/Plan H/O colon  cancer, s/p resection 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 -mobilize and pulm toilet -electrolytes good  FEN - NPO/NGT/SBO protocol VTE - heparin gtt ID - none currently   LOS: 16 days    Henreitta Cea , [facility] Surgery 04/07/2020, 8:21 AM Please see Amion for pager number during day hours 7:00am-4:30pm or 7:00am -11:30am on weekends

## 2020-04-07 NOTE — Progress Notes (Signed)
ANTICOAGULATION CONSULT NOTE  Pharmacy Consult:  Heparin Indication: clotted IVC and DVTs  Labs: Recent Labs    04/05/20 0351 04/06/20 0855 04/07/20 0156 04/07/20 1045  HGB 10.0*  --  9.6*  --   HCT 30.8*  --  29.8*  --   PLT 150  --  206  --   APTT  --   --  65* 66*  HEPARINUNFRC  --   --  1.50*  --   CREATININE  --  0.91 0.91  --     Assessment: 33 yoF with PMH prior PE and DVT s/p IVC in 2012 on no anticoagulation at home admitted with iliac vein thrombus and occluded IVC filter. 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 Patient is s/p catheter-directed lysis and mechanical thrombectomy on 6/8 and 6/9 with no acute residual thrombus.  She was then transitioned to Xarelto, but now with SBO and Pharmacy consulted to restart IV heparin.  Last Xarelto dose was on 04/05/20 around 1800, so will likely need to dose heparin based on aPTT until heparin level and aPTT correlate.  -APTT = 66  Noted 6/17 CT showed retroperitoneal hematoma    Goal of Therapy:  APTT 66-85 Heparin level 0.3-0.7 units/mL Monitor platelets per protocol: yes    Plan:  Continue heparin at 1000 units/hr Due to bleeding concerns we will decrease aPTT goal to 66-85 Daily heparin level, aPTT, CBC  Matthieu Ardyth Man PharmD Candidate, [phone]

## 2020-04-07 NOTE — Progress Notes (Signed)
Inpatient Rehabilitation-Admissions Coordinator   Pt not medically ready for admit to CIR due to ileus/partial SBO. AC will follow up with pt Monday.   Raechel Ache, OTR/L  Rehab Admissions Coordinator  [phone] 04/07/2020 2:32 PM

## 2020-04-07 NOTE — Progress Notes (Signed)
Update pt. Is now having uncontrollable persistent diarrhea. 3 occurrence in less than 1 hour. Bowel sounds in all 4 quadrants .will continue to monitor   Phoebe Sharps, RN

## 2020-04-07 NOTE — Progress Notes (Signed)
ANTICOAGULATION CONSULT NOTE  Pharmacy Consult:  Heparin Indication: clotted IVC and DVTs  Labs: Recent Labs    04/05/20 0351 04/06/20 0855 04/07/20 0156  HGB 10.0*  --  9.6*  HCT 30.8*  --  29.8*  PLT 150  --  206  APTT  --   --  65*  CREATININE  --  0.91 0.91    Assessment: 26 yoF with PMH prior PE and DVT s/p IVC in 2012 on no anticoagulation at home admitted with iliac vein thrombus and occluded IVC filter. 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 Patient is s/p catheter-directed lysis and mechanical thrombectomy on 6/8 and 6/9 with no acute residual thrombus.  She was then transitioned to Xarelto, but now with SBO and Pharmacy consulted to restart IV heparin.  Last Xarelto dose was on 04/05/20 around 1800, so will likely need to dose heparin based on aPTT until heparin level and aPTT correlate.  No bleeding reported.  6/18 AM update:  APTT just below goal  Goal of Therapy:  APTT 66-102 sec Heparin level 0.3-0.7 units/mL Monitor platelets per protocol: yes    Plan:  Inc heparin to 1000 units/hr 1130 aPTT  Narda Bonds, PharmD, BCPS Clinical Pharmacist Phone: [phone]

## 2020-04-07 NOTE — Progress Notes (Signed)
Mobility Specialist: Progress Note    04/07/20 1514  Mobility  Activity Refused mobility  Mobility performed by Mobility specialist    [facility] Odie Edmonds Mobility Specialist

## 2020-04-08 ENCOUNTER — Inpatient Hospital Stay (HOSPITAL_COMMUNITY): Payer: No Typology Code available for payment source

## 2020-04-08 LAB — MAGNESIUM: Magnesium: 1.4 mg/dL — ABNORMAL LOW (ref 1.7–2.4)

## 2020-04-08 LAB — BASIC METABOLIC PANEL
Anion gap: 9 (ref 5–15)
BUN: 6 mg/dL (ref 6–20)
CO2: 25 mmol/L (ref 22–32)
Calcium: 8 mg/dL — ABNORMAL LOW (ref 8.9–10.3)
Chloride: 103 mmol/L (ref 98–111)
Creatinine, Ser: 0.75 mg/dL (ref 0.44–1.00)
GFR calc Af Amer: >60 mL/min (ref >60)
GFR calc non Af Amer: >60 mL/min (ref >60)
Glucose, Bld: 103 mg/dL — ABNORMAL HIGH (ref 70–99)
Potassium: 3.4 mmol/L — ABNORMAL LOW (ref 3.5–5.1)
Sodium: 137 mmol/L (ref 135–145)

## 2020-04-08 LAB — APTT
aPTT: 60 seconds — ABNORMAL HIGH (ref 24–36)
aPTT: 56 seconds — ABNORMAL HIGH (ref 24–36)

## 2020-04-08 LAB — HEPARIN LEVEL (UNFRACTIONATED)
Heparin Unfractionated: 0.1 IU/mL — ABNORMAL LOW (ref 0.30–0.70)
Heparin Unfractionated: 0.1 IU/mL — ABNORMAL LOW (ref 0.30–0.70)
Heparin Unfractionated: 0.21 IU/mL — ABNORMAL LOW (ref 0.30–0.70)

## 2020-04-08 LAB — GLUCOSE, CAPILLARY
Glucose-Capillary: 92 mg/dL (ref 70–99)
Glucose-Capillary: 80 mg/dL (ref 70–99)
Glucose-Capillary: 71 mg/dL (ref 70–99)
Glucose-Capillary: 135 mg/dL — ABNORMAL HIGH (ref 70–99)
Glucose-Capillary: 119 mg/dL — ABNORMAL HIGH (ref 70–99)

## 2020-04-08 LAB — CBC
HCT: 29.4 % — ABNORMAL LOW (ref 36.0–46.0)
Hemoglobin: 9.5 g/dL — ABNORMAL LOW (ref 12.0–15.0)
MCH: 29.3 pg (ref 26.0–34.0)
MCHC: 32.3 g/dL (ref 30.0–36.0)
MCV: 90.7 fL (ref 80.0–100.0)
Platelets: 219 10*3/uL (ref 150–400)
RBC: 3.24 MIL/uL — ABNORMAL LOW (ref 3.87–5.11)
RDW: 15.3 % (ref 11.5–15.5)
WBC: 7.1 10*3/uL (ref 4.0–10.5)
nRBC: 0 % (ref 0.0–0.2)

## 2020-04-08 MED ORDER — HEPARIN BOLUS VIA INFUSION
1500.0000 [IU] | Freq: Once | INTRAVENOUS | Status: DC
  Filled 2020-04-08: qty 1500

## 2020-04-08 MED ORDER — POTASSIUM CHLORIDE 10 MEQ/100ML IV SOLN
10.0000 meq | INTRAVENOUS | Status: AC
  Administered 2020-04-08 (×4): 10 meq via INTRAVENOUS
  Filled 2020-04-08 (×4): qty 100

## 2020-04-08 MED ORDER — HEPARIN BOLUS VIA INFUSION
750.0000 [IU] | Freq: Once | INTRAVENOUS | Status: AC
  Administered 2020-04-08: 750 [IU] via INTRAVENOUS
  Filled 2020-04-08: qty 750

## 2020-04-08 MED ORDER — MAGNESIUM SULFATE 2 GM/50ML IV SOLN
2.0000 g | Freq: Once | INTRAVENOUS | Status: AC
  Administered 2020-04-08: 2 g via INTRAVENOUS
  Filled 2020-04-08: qty 50

## 2020-04-08 MED ORDER — MORPHINE SULFATE (PF) 2 MG/ML IV SOLN: 1.0000 mg | INTRAVENOUS | Status: DC | PRN

## 2020-04-08 MED ORDER — KCL-LACTATED RINGERS-D5W 20 MEQ/L IV SOLN
INTRAVENOUS | Status: DC
  Filled 2020-04-08 (×3): qty 1000

## 2020-04-08 NOTE — Progress Notes (Signed)
10 Days Post-Op  Subjective: Feeling much better.  Unable to mobilize much yesterday due to diarrhea.  No abdominal pain.  ROS: See above, otherwise other systems negative  Objective: Vital signs in last 24 hours: Temp:  [97.8 F (36.6 C)-98.9 F (37.2 C)] 97.8 F (36.6 C) (06/19 0806) Pulse Rate:  [68-82] 71 (06/19 0806) Resp:  [16-18] 17 (06/19 0438) BP: (123-148)/(81-91) 148/87 (06/19 0806) SpO2:  [97 %-100 %] 100 % (06/19 0806) Last BM Date: 04/07/20  Intake/Output from previous day: 06/18 0701 - 06/19 0700 In: 1579.8 [I.V.:1579.8] Out: 750 [Urine:400; Emesis/NG output:350] Intake/Output this shift: Total I/O In: -  Out: 900 [Urine:900]  PE: Abd: much softer, nontender, some BS, NGT with bilious output, but only 350cc documented in last 24hrs  Lab Results:  Recent Labs    04/07/20 0156 04/08/20 0336  WBC 9.0 7.1  HGB 9.6* 9.5*  HCT 29.8* 29.4*  PLT 206 219   BMET Recent Labs    04/07/20 0156 04/08/20 0336  NA 139 137  K 4.3 3.4*  CL 102 103  CO2 27 25  GLUCOSE 119* 103*  BUN 10 6  CREATININE 0.91 0.75  CALCIUM 8.6* 8.0*   PT/INR No results for input(s): LABPROT, INR in the last 72 hours. CMP     Component Value Date/Time   NA 137 04/08/2020 0336   NA 144 01/21/2018 1000   NA 142 12/17/2013 1538   K 3.4 (L) 04/08/2020 0336   K 4.1 12/17/2013 1538   CL 103 04/08/2020 0336   CL 106 12/07/2012 0838   CO2 25 04/08/2020 0336   CO2 28 12/17/2013 1538   GLUCOSE 103 (H) 04/08/2020 0336   GLUCOSE 123 12/17/2013 1538   GLUCOSE 119 (H) 12/07/2012 0838   BUN 6 04/08/2020 0336   BUN 12 01/21/2018 1000   BUN 14.1 12/17/2013 1538   CREATININE 0.75 04/08/2020 0336   CREATININE 0.96 10/27/2015 0904   CREATININE 0.8 12/17/2013 1538   CALCIUM 8.0 (L) 04/08/2020 0336   CALCIUM 9.7 12/17/2013 1538   PROT 6.3 (L) 04/06/2020 0855   PROT 7.7 12/17/2013 1538   ALBUMIN 2.8 (L) 04/06/2020 0855   ALBUMIN 4.0 12/17/2013 1538   AST 23 04/06/2020 0855    AST 15 12/17/2013 1538   ALT 25 04/06/2020 0855   ALT 19 12/17/2013 1538   ALKPHOS 76 04/06/2020 0855   ALKPHOS 147 12/17/2013 1538   BILITOT 2.5 (H) 04/06/2020 0855   BILITOT 0.30 12/17/2013 1538   GFRNONAA >60 04/08/2020 0336   GFRAA >60 04/08/2020 0336   Lipase     Component Value Date/Time   LIPASE 24 04/06/2020 0855       Studies/Results: CT ABDOMEN PELVIS WO CONTRAST  Result Date: 04/06/2020 CLINICAL DATA:  Nausea and vomiting, intermittent for several days, sepsis, abdominal distension, recent procedure for removal of IVC filter/thrombectomy with development of IVC perforation EXAM: CT ABDOMEN AND PELVIS WITHOUT CONTRAST TECHNIQUE: Multidetector CT imaging of the abdomen and pelvis was performed following the standard protocol without IV contrast. COMPARISON:  04/06/2020 FINDINGS: Lower chest: Trace left pleural effusion. Minimal dependent bilateral lower lobe atelectasis. Hepatobiliary: The dome of the liver is excluded by slice selection. Visualized portions of the unenhanced liver unremarkable. Gallbladder is decompressed, without focal abnormality. Pancreas: Unremarkable. No pancreatic ductal dilatation or surrounding inflammatory changes. Spleen: Normal in size without focal abnormality. Adrenals/Urinary Tract: No urinary tract calculi. There is mild distension of the renal pelves and ureters, likely due to significant bladder  distension. The adrenals are unremarkable. Stomach/Bowel: There is evidence of high-grade small bowel obstruction. There is marked distension of the duodenum and jejunum. The distal jejunum and ileum are decompressed. Exact point of transition is not well visualized. Colon is decompressed. No bowel wall thickening Vascular/Lymphatic: Since the prior exam, the IVC filter has been removed and a stent placed within the IVC. There is evidence of retroperitoneal hematoma related to known IVC perforation intraoperatively. This measures up to 2.5 cm in thickness.  Based on size, this is likely the sequela of the intraoperative perforation and there is no significant mass effect to suggest ongoing bleeding. Evaluation is limited without IV contrast. Minimal atherosclerosis of the aorta. No pathologic adenopathy. Reproductive: Uterus and bilateral adnexa are unremarkable. Other: There is no free intraperitoneal fluid or free gas. No abdominal wall hernia. Musculoskeletal: No acute or destructive bony lesions. Reconstructed images demonstrate no additional findings. IMPRESSION: 1. High-grade small bowel obstruction. Exact point of transition not well visualized, likely in the distal jejunum or proximal ileum. 2. Right-sided retroperitoneal hematoma, likely due to the intraoperative IVC perforation during IVC filter removal. IVC stent placed in the interim. 3. Trace left pleural effusion. Electronically Signed   By: Randa Ngo M.D.   On: 04/06/2020 16:00   DG Abd 1 View  Result Date: 04/06/2020 CLINICAL DATA:  Nasogastric tube placement EXAM: ABDOMEN - 1 VIEW COMPARISON:  CT abdomen and pelvis April 06, 2020 FINDINGS: Nasogastric tube tip and side port in stomach. There are multiple loops of mildly dilated bowel. No free air evident. Lung bases clear. Stent noted in inferior vena cava region. IMPRESSION: Nasogastric tube tip and side port in stomach. Loops of mildly dilated bowel, slightly less pronounced compared to earlier in the day. No free air evident. Lung bases clear. Electronically Signed   By: Lowella Grip III M.D.   On: 04/06/2020 17:59   DG Abd 2 Views  Result Date: 04/06/2020 CLINICAL DATA:  Abdominal pain and nausea as well as constipation today. EXAM: ABDOMEN - 2 VIEW COMPARISON:  CT 03/21/2020 FINDINGS: Examination demonstrates several air-filled dilated centralized small bowel loops measuring up to 5.7 cm in diameter. No free peritoneal air. Mild air and stool within the colon. Several differential air-fluid levels are present on the decubitus  film. Stent present over the mid to lower abdomen just right of midline. Remaining bones and soft tissues are unremarkable. IMPRESSION: Findings likely due to small-bowel obstruction and less likely ileus. Electronically Signed   By: Marin Olp M.D.   On: 04/06/2020 11:05   DG Abd Portable 1V  Result Date: 04/08/2020 CLINICAL DATA:  Initial evaluation for acute diarrhea. EXAM: PORTABLE ABDOMEN - 1 VIEW COMPARISON:  Prior radiograph from 04/07/2020 FINDINGS: Enteric tube in place with tip overlying the region of the pylorus, side hole well beyond the GE junction. There has been interval migration of contrast material since previous exam, now seen within the ascending, transverse, and descending colon. Few nondilated gas-filled loops of small bowel seen within the left and mid abdomen, similar to previous. No appreciable abnormal bowel wall thickening. No free air on these limited supine views of the abdomen. IVC filter overlying the right abdomen again noted. IMPRESSION: 1. Interval migration of contrast material since previous exam, now seen entirely within the nondilated colon. Few persistent mildly prominent gas-filled loops of small bowel overlie the mid and left abdomen, similar to previous. 2. Enteric tube in place with tip overlying the region of the pylorus. Electronically Signed  By: Jeannine Boga M.D.   On: 04/08/2020 08:18   DG Abd Portable 1V-Small Bowel Obstruction Protocol-initial, 8 hr delay  Result Date: 04/07/2020 CLINICAL DATA:  Bowel obstruction EXAM: PORTABLE ABDOMEN - 1 VIEW COMPARISON:  04/06/2020, CT 04/06/2020 FINDINGS: Esophageal tube tip in the right upper quadrant. Dilute contrast within mildly prominent left abdominal small bowel loops with contrast present in the colon. IVC stent. IMPRESSION: Overall decreased small bowel dilatation compared to prior. Dilute contrast present within left abdominal small bowel loops as well as the colon. Electronically Signed   By: Donavan Foil M.D.   On: 04/07/2020 21:00    Anti-infectives: Anti-infectives (From admission, onward)   Start     Dose/Rate Route Frequency Ordered Stop   03/23/20 2200  azithromycin (ZITHROMAX) tablet 500 mg  Status:  Discontinued        500 mg Oral Daily at bedtime 03/23/20 0806 03/25/20 1233   03/23/20 1000  remdesivir 100 mg in sodium chloride 0.9 % 100 mL IVPB       "Followed by" Linked Group Details   100 mg 200 mL/hr over 30 Minutes Intravenous Daily 03/22/20 0110 03/26/20 1913   03/22/20 2200  azithromycin (ZITHROMAX) 500 mg in sodium chloride 0.9 % 250 mL IVPB  Status:  Discontinued        500 mg 250 mL/hr over 60 Minutes Intravenous Every 24 hours 03/22/20 0111 03/23/20 0806   03/22/20 2200  cefTRIAXone (ROCEPHIN) 1 g in sodium chloride 0.9 % 100 mL IVPB  Status:  Discontinued        1 g 200 mL/hr over 30 Minutes Intravenous Every 24 hours 03/22/20 0111 03/25/20 1233   03/22/20 0200  remdesivir 200 mg in sodium chloride 0.9% 250 mL IVPB       "Followed by" Linked Group Details   200 mg 580 mL/hr over 30 Minutes Intravenous Once 03/22/20 0110 03/22/20 1900   03/22/20 0015  cefTRIAXone (ROCEPHIN) 1 g in sodium chloride 0.9 % 100 mL IVPB        1 g 200 mL/hr over 30 Minutes Intravenous  Once 03/22/20 0010 03/22/20 0103   03/21/20 2300  cefTRIAXone (ROCEPHIN) injection 1 g  Status:  Discontinued        1 g Intramuscular  Once 03/21/20 2253 03/22/20 0010   03/21/20 2300  azithromycin (ZITHROMAX) 500 mg in sodium chloride 0.9 % 250 mL IVPB        500 mg 250 mL/hr over 60 Minutes Intravenous  Once 03/21/20 2253 03/22/20 0129       Assessment/Plan H/O colon cancer, s/p resection 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  If she tolerates this hopefully this can be removed tomorrow and diet advanced -mobilize and pulm toilet -electrolytes  good  FEN -NPO/NGT clamped VTE -heparin gtt ID -none currently   LOS: 17 days    Henreitta Cea , [facility] 04/08/2020, 8:28 AM Please see Amion for pager number during day hours 7:00am-4:30pm or 7:00am -11:30am on weekends

## 2020-04-08 NOTE — Progress Notes (Addendum)
PROGRESS NOTE                                                                                                                                                                                                             Patient Demographics:    [name], is a 54 y.o. female, DOB - Nov 22, 1965, QQV:[id number]  Outpatient Primary MD for the patient is Glendale Chard, MD   Admit date - 03/21/2020   LOS - 75  Chief Complaint  Patient presents with  . Hypotension  . Back Pain       Brief Narrative: Patient is a 54 y.o. female with PMHx of DM-2, HLD, hypothyroidism, PE-s/p IVC filter in 2012, history of colon cancer-diagnosed with COVID-19 on 5/21-presented with fatigue/malaise, low back pain-found to have patchy airspace disease on CTA chest, MRI thoracic/lumbar spine showed T7 disc herniation-but also showed extensive clot in the IVC.  Subsequently a Doppler showed extensive DVT in the bilateral lower extremity.  She was started on IV heparin-vascular surgery was consulted underwent catheter guided lytic therapy-subsequent mechanical thrombectomy/IVC filter removal-complicated by IVC tear requiring IVC stenting.  Postoperatively-developed acute blood loss anemia with hypovolemic shock secondary to blood loss-monitored in the ICU-briefly required pressors.  Once stabilized-she was transferred to a medical unit-while awaiting CIR bed-she started developing vomiting-ultimately found to have ileus/partial small bowel obstruction.  See below for further details.  Significant Events: 6/1>> admit to [facility] for low back pain/weakness/malaise-found to have COVID-19 pneumonia on CT imaging. 6/2>> lower extremity Doppler/MRI LS spine-significant lower extremity DVT extending up to IVC filter 6/8>> catheter guided lytic therapy for bilateral lower extremity DVT 6/9>> mechanical thrombectomy/IVC filter removal 6/10>> hypotension overnight-requiring  initiation of Levophed 6/11>> off Levophed since 6/10 evening-transfer out of ICU 6/17>> worsening of vomiting-found to have ileus/SBO-NG tube inserted.  Significant studies: 6/1>> CTA chest: No PE-multifocal patchy airspace opacities throughout both lungs. 6/1>> CT abdomen/pelvis: Hepatic steatosis, aortic atherosclerosis 6/2>> MRI thoracic spine: T7-8 central to left-sided disc herniation 6/2>> MRI lumbar spine: No significant disc pathology/no stenosis or neurocompression.  Thrombosis of the IVC, both iliac veins to the level of the IVC filter. 6/2>> lower extremity Doppler: Bilateral lower extremity extensive DVT. 6/17>> CT abdomen/pelvis: High-grade small bowel obstruction, right-sided retroperitoneal hematoma  Procedures: 6/8>>  #1: Ultrasound-guided access, bilateral popliteal vein  #2: Intravascular ultrasound, bilateral femoral, common  femoral, external iliac, common iliac veins, and inferior vena cava #3: Placement of bilateral intravenous infusion catheter for thrombolytic therapy into the vena cava.                         #4: Initiation of thrombolytic therapy (TPA into the inferior vena cava, common iliac, external iliac, common femoral, and femoral veins  6/9>> 1.  Ultrasound-guided cannulation of right internal jugular vein 2.  Ultrasound-guided cannulation of left internal jugular vein 3.  Ultrasound-guided placement of 7 French double-lumen central venous line and left internal jugular vein 4.  Intravascular ultrasound of bilateral femoral, bilateral common femoral, bilateral external iliac and bilateral common iliac veins and IVC 5.  IVC venogram 6.  Angio VAC and cleaner wire mechanical thrombectomy of IVC, bilateral common iliac, bilateral external iliac, bilateral common femoral and bilateral femoral veins 7.  Stent of IVC with 18 x 90 mm Wallstent 8.  Ultrasound-guided cannulation right common femoral vein 9.  Veno venous bypass for 58 minutes  COVID-19  medications: Steroids:6/1>>6/9 Remdesivir:6/1>>6/6  Antibiotics: Zithromax: 6/1>> 6/4 Rocephin: 6/1>> 6/4  Microbiology data: 6/1>> blood culture: Negative 6/2>> urine culture: Negative  Consults: VVS CCS  DVT prophylaxis: Xarelto>> to IV heparin as n.p.o./NG tube in place   Subjective:   Patient in bed, appears comfortable, denies any headache, no fever, no chest pain or pressure, no shortness of breath , no abdominal pain. Having BMs, No focal weakness.    Assessment  & Plan :   Sepsis secondary to Covid 19 Viral pneumonia: Sepsis physiology has resolved-has completed a course of steroids/remdesivir.  Remains stable on room air.  This problem has resolved.   Extensive bilateral lower extremity DVTs-involving bilateral iliac veins and extending to IVC up to the level of IVC filter: s/p bilateral popliteal lytic catheter placement on 6/8-and mechanical thrombectomy/IVC filter on 6/9-vascular surgery following.  Was on argatroban due to thrombocytopenia-HIT antibody negative-platelet counts improving-had been transitioned to Xarelto on 6/14.  But due to ileus/SBO-placed back on IV heparin-with plans to transition back to Xarelto when SBO resolves.  Hypovolemic shock: Resolved-secondary to blood loss related to IVC tear during IVC filter removal-s/p stenting-resuscitated with IV fluids-briefly required Levophed while in the ICU.  BP now stable.  Acute blood loss anemia: Secondary to perioperative blood loss-see operative note-follow CBC-no indication for transfusion.  Hemoglobin slowly improving.  Thrombocytopenia: Continues to have stable thrombocytopenia-HIT antibody negative.  Platelet count slowly improving-follow closely.  Small bowel obstruction/ileus: CCS following.  NG in place but started having bowel movements night of 04/07/2020, discussed with general surgery they will likely clamp the NG and monitor.  AKI: Likely hemodynamically mediated-resolved  Hypokalemia and  hypomagnesemia.  Both replaced.    T7-T8 disc herniation: Prior MD - Dr. Waldron Labs already discussed MRI findings with Dr. Jenkins-recommendations were for outpatient follow-up.  Hypothyroidism: Continue Synthroid.  TSH mildly elevated-not sure when her Synthroid dosing was adjusted last-stable for outpatient follow-up/adjustment with PCP.  HLD: Continue statin  Deconditioning/debility: Secondary to acute illness/blood loss anemia/numerous procedures-has nausea/pain/tachycardia with ambulation -evaluated by rehab services-recommendations are for CIR  DM-2 (A1c 8.6) with uncontrolled hyperglycemia with mild DKA: CBG stable with SSI and Tradjenta.  Long-acting insulin discontinued on 6/14 as CBGs only in the 80s/90s.  Poor outpatient control due to hyperglycemia.  Recent Labs    04/07/20 2119 04/08/20 0011 04/08/20 0624  GLUCAP 77 92 80   Lab Results  Component Value Date   HGBA1C 8.6 (H) 03/23/2020  Nutrition Problem: Nutrition Problem: Increased nutrient needs Etiology: acute illness (COVID) Signs/Symptoms: estimated needs Interventions: Glucerna shake      Condition -Stable  Family Communication  : spouse over the phone 6/18  Code Status :  Full Code  Diet :   Diet Order            Diet NPO time specified Except for: Ice Chips  Diet effective now                  Disposition Plan  :  Status is: Inpatient  Remains inpatient appropriate because:Inpatient level of care appropriate due to severity of illness  Dispo: The patient is from: Home              Anticipated d/c is to: Home              Anticipated d/c date is: 2 days              Patient currently is not medically stable to d/c.  Barriers to discharge: Ileus/partial small bowel obstruction-requiring NG tube decompression-and other conservative measures.  Antimicorbials  :    Anti-infectives (From admission, onward)   Start     Dose/Rate Route Frequency Ordered Stop   03/23/20 2200   azithromycin (ZITHROMAX) tablet 500 mg  Status:  Discontinued        500 mg Oral Daily at bedtime 03/23/20 0806 03/25/20 1233   03/23/20 1000  remdesivir 100 mg in sodium chloride 0.9 % 100 mL IVPB       "Followed by" Linked Group Details   100 mg 200 mL/hr over 30 Minutes Intravenous Daily 03/22/20 0110 03/26/20 1913   03/22/20 2200  azithromycin (ZITHROMAX) 500 mg in sodium chloride 0.9 % 250 mL IVPB  Status:  Discontinued        500 mg 250 mL/hr over 60 Minutes Intravenous Every 24 hours 03/22/20 0111 03/23/20 0806   03/22/20 2200  cefTRIAXone (ROCEPHIN) 1 g in sodium chloride 0.9 % 100 mL IVPB  Status:  Discontinued        1 g 200 mL/hr over 30 Minutes Intravenous Every 24 hours 03/22/20 0111 03/25/20 1233   03/22/20 0200  remdesivir 200 mg in sodium chloride 0.9% 250 mL IVPB       "Followed by" Linked Group Details   200 mg 580 mL/hr over 30 Minutes Intravenous Once 03/22/20 0110 03/22/20 1900   03/22/20 0015  cefTRIAXone (ROCEPHIN) 1 g in sodium chloride 0.9 % 100 mL IVPB        1 g 200 mL/hr over 30 Minutes Intravenous  Once 03/22/20 0010 03/22/20 0103   03/21/20 2300  cefTRIAXone (ROCEPHIN) injection 1 g  Status:  Discontinued        1 g Intramuscular  Once 03/21/20 2253 03/22/20 0010   03/21/20 2300  azithromycin (ZITHROMAX) 500 mg in sodium chloride 0.9 % 250 mL IVPB        500 mg 250 mL/hr over 60 Minutes Intravenous  Once 03/21/20 2253 03/22/20 0129      Inpatient Medications  Scheduled Meds: . Chlorhexidine Gluconate Cloth  6 each Topical Daily  . insulin aspart  0-15 Units Subcutaneous TID WC  . insulin aspart  0-5 Units Subcutaneous QHS  . levothyroxine  50 mcg Intravenous Daily  . mupirocin ointment   Topical BID  . pantoprazole (PROTONIX) IV  40 mg Intravenous Q24H  . sodium chloride flush  10-40 mL Intracatheter Q12H  . sodium chloride flush  3  mL Intravenous Q12H   Continuous Infusions: . sodium chloride 75 mL/hr at 04/08/20 0500  . dextrose 5% lactated  ringers with KCl 20 mEq/L    . heparin 1,100 Units/hr (04/08/20 0449)  . potassium chloride 10 mEq (04/08/20 0800)  . sodium chloride     PRN Meds:.acetaminophen, albuterol, guaiFENesin-dextromethorphan, morphine injection, ondansetron **OR** ondansetron (ZOFRAN) IV, promethazine, sodium chloride, sodium chloride flush, zolpidem   Time Spent in minutes  25  See all Orders from today for further details   Lala Lund M.D on 04/08/2020 at 8:26 AM  To page go to [email or web address] - use universal password  [facility] -  Office  [phone]    Objective:   Vitals:   04/08/20 0012 04/08/20 0438 04/08/20 0440 04/08/20 0806  BP: (!) 141/91  128/82 (!) 148/87  Pulse: 79  75 71  Resp: 17 17    Temp: 98.9 F (37.2 C) 98.6 F (37 C)  97.8 F (36.6 C)  TempSrc: Oral Oral  Oral  SpO2: 100%  99% 100%  Weight:      Height:        Wt Readings from Last 3 Encounters:  04/07/20 101.4 kg  02/15/20 91.2 kg  08/02/19 95.3 kg     Intake/Output Summary (Last 24 hours) at 04/08/2020 0826 Last data filed at 04/08/2020 0824 Gross per 24 hour  Intake 1579.82 ml  Output 1650 ml  Net -70.18 ml    Physical Exam  Awake Alert, No new F.N deficits, Normal affect [place].AT,PERRAL Supple Neck,No JVD, No cervical lymphadenopathy appriciated.  Symmetrical Chest wall movement, Good air movement bilaterally, CTAB RRR,No Gallops, Rubs or new Murmurs, No Parasternal Heave +ve B.Sounds, Abd Soft, No tenderness, No organomegaly appriciated, No rebound - guarding or rigidity. NG in place No Cyanosis, Clubbing or edema, No new Rash or bruise    Data Review:    CBC Recent Labs  Lab 04/03/20 0510 04/04/20 0238 04/05/20 0351 04/07/20 0156 04/08/20 0336  WBC 8.2 11.5* 12.5* 9.0 7.1  HGB 9.6* 9.8* 10.0* 9.6* 9.5*  HCT 28.2* 30.3* 30.8* 29.8* 29.4*  PLT 99* 114* 150 206 219  MCV 91.0 92.1 92.8 92.0 90.7  MCH 31.0 29.8 30.1 29.6 29.3  MCHC 34.0 32.3 32.5 32.2 32.3  RDW 16.5* 16.7* 17.0*  16.0* 15.3    Chemistries  Recent Labs  Lab 04/06/20 0855 04/07/20 0156 04/08/20 0336  NA 136 139 137  K 4.2 4.3 3.4*  CL 98 102 103  CO2 28 27 25   GLUCOSE 113* 119* 103*  BUN 14 10 6   CREATININE 0.91 0.91 0.75  CALCIUM 9.0 8.6* 8.0*  AST 23  --   --   ALT 25  --   --   ALKPHOS 76  --   --   BILITOT 2.5*  --   --    ------------------------------------------------------------------------------------------------------------------ No results for input(s): CHOL, HDL, LDLCALC, TRIG, CHOLHDL, LDLDIRECT in the last 72 hours.  Lab Results  Component Value Date   HGBA1C 8.6 (H) 03/23/2020   ------------------------------------------------------------------------------------------------------------------ No results for input(s): TSH, T4TOTAL, T3FREE, THYROIDAB in the last 72 hours.  Invalid input(s): FREET3 ------------------------------------------------------------------------------------------------------------------ No results for input(s): VITAMINB12, FOLATE, FERRITIN, TIBC, IRON, RETICCTPCT in the last 72 hours.  Coagulation profile No results for input(s): INR, PROTIME in the last 168 hours.  No results for input(s): DDIMER in the last 72 hours.  Cardiac Enzymes No results for input(s): CKMB, TROPONINI, MYOGLOBIN in the last 168 hours.  Invalid input(s): CK ------------------------------------------------------------------------------------------------------------------  Component Value Date/Time   BNP 29.9 03/21/2020 2148    Micro Results No results found for this or any previous visit (from the past 240 hour(s)).  Radiology Reports CT ABDOMEN PELVIS WO CONTRAST  Result Date: 04/06/2020 CLINICAL DATA:  Nausea and vomiting, intermittent for several days, sepsis, abdominal distension, recent procedure for removal of IVC filter/thrombectomy with development of IVC perforation EXAM: CT ABDOMEN AND PELVIS WITHOUT CONTRAST TECHNIQUE: Multidetector CT imaging of the  abdomen and pelvis was performed following the standard protocol without IV contrast. COMPARISON:  04/06/2020 FINDINGS: Lower chest: Trace left pleural effusion. Minimal dependent bilateral lower lobe atelectasis. Hepatobiliary: The dome of the liver is excluded by slice selection. Visualized portions of the unenhanced liver unremarkable. Gallbladder is decompressed, without focal abnormality. Pancreas: Unremarkable. No pancreatic ductal dilatation or surrounding inflammatory changes. Spleen: Normal in size without focal abnormality. Adrenals/Urinary Tract: No urinary tract calculi. There is mild distension of the renal pelves and ureters, likely due to significant bladder distension. The adrenals are unremarkable. Stomach/Bowel: There is evidence of high-grade small bowel obstruction. There is marked distension of the duodenum and jejunum. The distal jejunum and ileum are decompressed. Exact point of transition is not well visualized. Colon is decompressed. No bowel wall thickening Vascular/Lymphatic: Since the prior exam, the IVC filter has been removed and a stent placed within the IVC. There is evidence of retroperitoneal hematoma related to known IVC perforation intraoperatively. This measures up to 2.5 cm in thickness. Based on size, this is likely the sequela of the intraoperative perforation and there is no significant mass effect to suggest ongoing bleeding. Evaluation is limited without IV contrast. Minimal atherosclerosis of the aorta. No pathologic adenopathy. Reproductive: Uterus and bilateral adnexa are unremarkable. Other: There is no free intraperitoneal fluid or free gas. No abdominal wall hernia. Musculoskeletal: No acute or destructive bony lesions. Reconstructed images demonstrate no additional findings. IMPRESSION: 1. High-grade small bowel obstruction. Exact point of transition not well visualized, likely in the distal jejunum or proximal ileum. 2. Right-sided retroperitoneal hematoma, likely  due to the intraoperative IVC perforation during IVC filter removal. IVC stent placed in the interim. 3. Trace left pleural effusion. Electronically Signed   By: Randa Ngo M.D.   On: 04/06/2020 16:00   DG Abd 1 View  Result Date: 04/06/2020 CLINICAL DATA:  Nasogastric tube placement EXAM: ABDOMEN - 1 VIEW COMPARISON:  CT abdomen and pelvis April 06, 2020 FINDINGS: Nasogastric tube tip and side port in stomach. There are multiple loops of mildly dilated bowel. No free air evident. Lung bases clear. Stent noted in inferior vena cava region. IMPRESSION: Nasogastric tube tip and side port in stomach. Loops of mildly dilated bowel, slightly less pronounced compared to earlier in the day. No free air evident. Lung bases clear. Electronically Signed   By: Lowella Grip III M.D.   On: 04/06/2020 17:59   CT Angio Chest PE W and/or Wo Contrast  Result Date: 03/22/2020 CLINICAL DATA:  Severe back pain hypotension and shallow EXAM: CT ANGIOGRAPHY CHEST WITH CONTRAST TECHNIQUE: Multidetector CT imaging of the chest was performed using the standard protocol during bolus administration of intravenous contrast. Multiplanar CT image reconstructions and MIPs were obtained to evaluate the vascular anatomy. CONTRAST:  3mL OMNIPAQUE IOHEXOL 350 MG/ML SOLN COMPARISON:  None. FINDINGS: Cardiovascular: There is a optimal opacification of the pulmonary arteries. There is no central,segmental, or subsegmental filling defects within the pulmonary arteries. The heart is normal in size. No pericardial effusion or thickening. No evidence right  heart strain. There is normal three-vessel brachiocephalic anatomy without proximal stenosis. The thoracic aorta is normal in appearance. Mediastinum/Nodes: No hilar, mediastinal, or axillary adenopathy. Thyroid gland, trachea, and esophagus demonstrate no significant findings. Lungs/Pleura: Multifocal patchy ground-glass opacities are seen predominantly within the periphery of both lungs.  No pleural effusion or pneumothorax is seen. Upper Abdomen: No acute abnormalities present in the visualized portions of the upper abdomen. Musculoskeletal: No chest wall abnormality. No acute or significant osseous findings. Review of the MIP images confirms the above findings. Abdomen/pelvis: Hepatobiliary: There is diffuse low density seen throughout the liver parenchyma. The main portal vein is patent. No evidence of calcified gallstones, gallbladder wall thickening or biliary dilatation. Pancreas: Unremarkable. No pancreatic ductal dilatation or surrounding inflammatory changes. Spleen: Normal in size without focal abnormality. Adrenals/Urinary Tract: Both adrenal glands appear normal. The kidneys and collecting system appear normal without evidence of urinary tract calculus or hydronephrosis. Bladder is unremarkable. Stomach/Bowel: The stomach, small bowel, and colon are normal in appearance. No inflammatory changes, wall thickening, or obstructive findings. There appears to be a surgical anastomosis at the sigmoid rectal junction. Mild presacral soft tissue density is again identified. Vascular/Lymphatic: There are no enlarged mesenteric, retroperitoneal, or pelvic lymph nodes. An infrarenal IVC filter is seen at approximately the L3 level. There is scattered aortic and bi-iliac calcifications noted. Reproductive: The uterus and adnexa are unremarkable. Other: No evidence of abdominal wall mass or hernia. Musculoskeletal: No acute or significant osseous findings. IMPRESSION: No central, segmental, or subsegmental pulmonary embolism. Multifocal patchy airspace opacities throughout both lungs, likely consistent with multifocal pneumonia. Hepatic steatosis Aortic Atherosclerosis (ICD10-I70.0). Electronically Signed   By: Prudencio Pair M.D.   On: 03/22/2020 00:07   MR THORACIC SPINE W WO CONTRAST  Result Date: 03/22/2020 CLINICAL DATA:  Bilateral lower extremity pain and numbness with urinary retention.  Diabetes. History of colon cancer. EXAM: MRI THORACIC WITHOUT AND WITH CONTRAST TECHNIQUE: Multiplanar and multiecho pulse sequences of the thoracic spine were obtained without and with intravenous contrast. CONTRAST:  9.58mL GADAVIST GADOBUTROL 1 MMOL/ML IV SOLN COMPARISON:  CT chest done yesterday. FINDINGS: MRI THORACIC SPINE FINDINGS Alignment:  Normal Vertebrae: Normal. No fracture or primary bone lesion. No metastatic disease. Insignificant small hemangioma within the T11 vertebral body. Cord: No primary cord pathology. See below regarding stenosis at T7-8. Paraspinal and other soft tissues: Negative Disc levels: No abnormality at T5-6 or above. T6-7: Small right paracentral disc protrusion indents the thecal sac slightly but does not cause apparent neural compression. T7-8: Left posterolateral disc herniation effaces the ventral subarachnoid space and indents the left side of the cord. This could be symptomatic. T8-9 through T12-L1: Normal. 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 Ample subarachnoid space is present dorsal to the cord at this level. None the less, this could possibly be symptomatic. Small right paracentral disc herniation at T6-7 without apparent neural compression. Electronically Signed   By: Nelson Chimes M.D.   On: 03/22/2020 12:32   MR Lumbar Spine W Wo Contrast  Result Date: 03/22/2020 CLINICAL DATA:  Back pain.  Urinary retention.  Diabetes. EXAM: MRI LUMBAR SPINE WITHOUT AND WITH CONTRAST TECHNIQUE: Multiplanar and multiecho pulse sequences of the lumbar spine were obtained without and with intravenous contrast. CONTRAST:  9.41mL GADAVIST GADOBUTROL 1 MMOL/ML IV SOLN COMPARISON:  None. FINDINGS: Segmentation:  5 lumbar type vertebral bodies. Alignment:  Slightly exaggerated lower lumbar  lordosis. Vertebrae:  No fracture or primary bone lesion. Conus medullaris and cauda  equina: Conus extends to the L1 level. Conus and cauda equina appear normal. Paraspinal and other soft tissues: There appears be thrombosis of the IVC and both iliac veins up to the level of the IVC filter. Flow is restored in the IVC by inflow from the renal veins. Disc levels: No abnormality from L1-2 through L2-3. L3-4: No disc abnormality.  Mild facet hypertrophy.  No stenosis. L4-5: No disc abnormality. Mild to moderate facet hypertrophy. No stenosis. L5-S1: Minimal disc bulge.  Mild facet hypertrophy.  No stenosis. IMPRESSION: No significant disc pathology. No stenosis or neural compression in the lumbar region. Patient has exaggerated lumbar lordosis. 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 Flow is restored in the IVC above that by inflow from the renal veins. I am in the process of calling this report. Electronically Signed   By: Nelson Chimes M.D.   On: 03/22/2020 12:46   CT ABDOMEN PELVIS W CONTRAST  Result Date: 03/22/2020 CLINICAL DATA:  Severe back pain hypotension and shallow EXAM: CT ANGIOGRAPHY CHEST WITH CONTRAST TECHNIQUE: Multidetector CT imaging of the chest was performed using the standard protocol during bolus administration of intravenous contrast. Multiplanar CT image reconstructions and MIPs were obtained to evaluate the vascular anatomy. CONTRAST:  64mL OMNIPAQUE IOHEXOL 350 MG/ML SOLN COMPARISON:  None. FINDINGS: Cardiovascular: There is a optimal opacification of the pulmonary arteries. There is no central,segmental, or subsegmental filling defects within the pulmonary arteries. The heart is normal in size. No pericardial effusion or thickening. No evidence right heart strain. There is normal three-vessel brachiocephalic anatomy without proximal stenosis. The thoracic aorta is normal in appearance.  Mediastinum/Nodes: No hilar, mediastinal, or axillary adenopathy. Thyroid gland, trachea, and esophagus demonstrate no significant findings. Lungs/Pleura: Multifocal patchy ground-glass opacities are seen predominantly within the periphery of both lungs. No pleural effusion or pneumothorax is seen. Upper Abdomen: No acute abnormalities present in the visualized portions of the upper abdomen. Musculoskeletal: No chest wall abnormality. No acute or significant osseous findings. Review of the MIP images confirms the above findings. Abdomen/pelvis: Hepatobiliary: There is diffuse low density seen throughout the liver parenchyma. The main portal vein is patent. No evidence of calcified gallstones, gallbladder wall thickening or biliary dilatation. Pancreas: Unremarkable. No pancreatic ductal dilatation or surrounding inflammatory changes. Spleen: Normal in size without focal abnormality. Adrenals/Urinary Tract: Both adrenal glands appear normal. The kidneys and collecting system appear normal without evidence of urinary tract calculus or hydronephrosis. Bladder is unremarkable. Stomach/Bowel: The stomach, small bowel, and colon are normal in appearance. No inflammatory changes, wall thickening, or obstructive findings. There appears to be a surgical anastomosis at the sigmoid rectal junction. Mild presacral soft tissue density is again identified. Vascular/Lymphatic: There are no enlarged mesenteric, retroperitoneal, or pelvic lymph nodes. An infrarenal IVC filter is seen at approximately the L3 level. There is scattered aortic and bi-iliac calcifications noted. Reproductive: The uterus and adnexa are unremarkable. Other: No evidence of abdominal wall mass or hernia. Musculoskeletal: No acute or significant osseous findings. IMPRESSION: No central, segmental, or subsegmental pulmonary embolism. Multifocal patchy airspace opacities throughout both lungs, likely consistent with multifocal pneumonia. Hepatic steatosis  Aortic Atherosclerosis (ICD10-I70.0). Electronically Signed   By: Prudencio Pair M.D.   On: 03/22/2020  00:07   DG Chest Port 1 View  Result Date: 03/21/2020 CLINICAL DATA:  54 year old female with weakness and hypotension. EXAM: PORTABLE CHEST 1 VIEW COMPARISON:  Chest radiograph dated 03/25/2016 FINDINGS: Faint bilateral peripheral and subpleural densities, right greater left concerning for pneumonia, possibly atypical or viral in etiology including COVID-19. Clinical correlation is recommended. No focal consolidation, pleural effusion, pneumothorax. The cardiac silhouette is within limits. No acute osseous pathology. IMPRESSION: Findings concerning for pneumonia. Electronically Signed   By: Anner Crete M.D.   On: 03/21/2020 22:17   DG Abd 2 Views  Result Date: 04/06/2020 CLINICAL DATA:  Abdominal pain and nausea as well as constipation today. EXAM: ABDOMEN - 2 VIEW COMPARISON:  CT 03/21/2020 FINDINGS: Examination demonstrates several air-filled dilated centralized small bowel loops measuring up to 5.7 cm in diameter. No free peritoneal air. Mild air and stool within the colon. Several differential air-fluid levels are present on the decubitus film. Stent present over the mid to lower abdomen just right of midline. Remaining bones and soft tissues are unremarkable. IMPRESSION: Findings likely due to small-bowel obstruction and less likely ileus. Electronically Signed   By: Marin Olp M.D.   On: 04/06/2020 11:05   DG Abd Portable 1V  Result Date: 04/08/2020 CLINICAL DATA:  Initial evaluation for acute diarrhea. EXAM: PORTABLE ABDOMEN - 1 VIEW COMPARISON:  Prior radiograph from 04/07/2020 FINDINGS: Enteric tube in place with tip overlying the region of the pylorus, side hole well beyond the GE junction. There has been interval migration of contrast material since previous exam, now seen within the ascending, transverse, and descending colon. Few nondilated gas-filled loops of small bowel seen within  the left and mid abdomen, similar to previous. No appreciable abnormal bowel wall thickening. No free air on these limited supine views of the abdomen. IVC filter overlying the right abdomen again noted. IMPRESSION: 1. Interval migration of contrast material since previous exam, now seen entirely within the nondilated colon. Few persistent mildly prominent gas-filled loops of small bowel overlie the mid and left abdomen, similar to previous. 2. Enteric tube in place with tip overlying the region of the pylorus. Electronically Signed   By: Jeannine Boga M.D.   On: 04/08/2020 08:18   DG Abd Portable 1V-Small Bowel Obstruction Protocol-initial, 8 hr delay  Result Date: 04/07/2020 CLINICAL DATA:  Bowel obstruction EXAM: PORTABLE ABDOMEN - 1 VIEW COMPARISON:  04/06/2020, CT 04/06/2020 FINDINGS: Esophageal tube tip in the right upper quadrant. Dilute contrast within mildly prominent left abdominal small bowel loops with contrast present in the colon. IVC stent. IMPRESSION: Overall decreased small bowel dilatation compared to prior. Dilute contrast present within left abdominal small bowel loops as well as the colon. Electronically Signed   By: Donavan Foil M.D.   On: 04/07/2020 21:00   VAS [place] LOWER EXTREMITY VENOUS (DVT)  Result Date: 03/22/2020  Lower Venous DVTStudy Indications: Hx DVT and IVC filter, now with COVID and elevated D dimer.  Comparison Study: 06/07/15 negative Performing Technologist: June Leap RDMS, RVT  Examination Guidelines: A complete evaluation includes B-mode imaging, spectral Doppler, color Doppler, and power Doppler as needed of all accessible portions of each vessel. Bilateral testing is considered an integral part of a complete examination. Limited examinations for reoccurring indications may be performed as noted. The reflux portion of the exam is performed with the patient in reverse Trendelenburg.  +---------+---------------+---------+-----------+----------+-----------------+  RIGHT    CompressibilityPhasicitySpontaneityPropertiesThrombus Aging    +---------+---------------+---------+-----------+----------+-----------------+ CFV      None  No       No                   Acute             +---------+---------------+---------+-----------+----------+-----------------+ SFJ      None                                         Acute             +---------+---------------+---------+-----------+----------+-----------------+ FV Prox  None                                         Acute             +---------+---------------+---------+-----------+----------+-----------------+ FV Mid   None                                         Acute             +---------+---------------+---------+-----------+----------+-----------------+ FV DistalNone                                         Acute             +---------+---------------+---------+-----------+----------+-----------------+ PFV      None                                         Acute             +---------+---------------+---------+-----------+----------+-----------------+ POP      None           No       No                   Acute             +---------+---------------+---------+-----------+----------+-----------------+ PTV      None                                         Acute             +---------+---------------+---------+-----------+----------+-----------------+ PERO     None                                         Acute             +---------+---------------+---------+-----------+----------+-----------------+ Gastroc  None                                         Acute             +---------+---------------+---------+-----------+----------+-----------------+ GSV      None  Acute             +---------+---------------+---------+-----------+----------+-----------------+ Ex Iliac                No       No                    Age Indeterminate +---------+---------------+---------+-----------+----------+-----------------+ Com Iliac                                             Not visualized    +---------+---------------+---------+-----------+----------+-----------------+   +---------+---------------+---------+-----------+----------+-----------------+ LEFT     CompressibilityPhasicitySpontaneityPropertiesThrombus Aging    +---------+---------------+---------+-----------+----------+-----------------+ CFV      None           No       No                   Acute             +---------+---------------+---------+-----------+----------+-----------------+ SFJ      None                                         Acute             +---------+---------------+---------+-----------+----------+-----------------+ FV Prox  None                                         Acute             +---------+---------------+---------+-----------+----------+-----------------+ FV Mid   None                                         Acute             +---------+---------------+---------+-----------+----------+-----------------+ FV DistalNone                                         Acute             +---------+---------------+---------+-----------+----------+-----------------+ PFV      None                                         Acute             +---------+---------------+---------+-----------+----------+-----------------+ POP      None           No       No                   Acute             +---------+---------------+---------+-----------+----------+-----------------+ PTV      None                                         Acute             +---------+---------------+---------+-----------+----------+-----------------+ PERO  None                                         Acute             +---------+---------------+---------+-----------+----------+-----------------+ Gastroc  None                                          Acute             +---------+---------------+---------+-----------+----------+-----------------+ GSV      None                                         Acute             +---------+---------------+---------+-----------+----------+-----------------+ Ex Iliac                No       No                   Age Indeterminate +---------+---------------+---------+-----------+----------+-----------------+ Com Iliac                                             Not visualized    +---------+---------------+---------+-----------+----------+-----------------+     Summary: RIGHT: - Findings consistent with acute deep vein thrombosis involving the right common femoral vein, SF junction, right femoral vein, right proximal profunda vein, right popliteal vein, right posterior tibial veins, right peroneal veins, and right gastrocnemius veins. - Findings consistent with acute superficial vein thrombosis involving the right great saphenous vein. - Thrombosis extends into the Right Iliac vein and IVC up to the level of the filter placement.  LEFT: - Findings consistent with acute deep vein thrombosis involving the left common femoral vein, SF junction, left femoral vein, left proximal profunda vein, left popliteal vein, left posterior tibial veins, left peroneal veins, and left gastrocnemius veins. - Findings consistent with acute superficial vein thrombosis involving the left great saphenous vein. - Thrombosis extends into the Left Iliac vein.  *See table(s) above for measurements and observations. Electronically signed by Curt Jews MD on 03/22/2020 at 5:19:17 PM.    Final    HYBRID OR IMAGING ([place])  Result Date: 03/29/2020 There is no interpretation for this exam.  This order is for images obtained during a surgical procedure.  Please See "Surgeries" Tab for more information regarding the procedure.   HYBRID OR IMAGING ([facility] ONLY)  Result Date: 03/28/2020 There is no  interpretation for this exam.  This order is for images obtained during a surgical procedure.  Please See "Surgeries" Tab for more information regarding the procedure.

## 2020-04-08 NOTE — Progress Notes (Signed)
  Progress Note    04/08/2020 11:01 AM 10 Days Post-Op  Subjective: Abdomen feeling better  Vitals:   04/08/20 0440 04/08/20 0806  BP: 128/82 (!) 148/87  Pulse: 75 71  Resp:    Temp:  97.8 F (36.6 C)  SpO2: 99% 100%    Physical Exam: Awake alert oriented x3 Unlabored respirations Abdomen is soft Bilateral lower extremities with trace edema only  CBC    Component Value Date/Time   WBC 7.1 04/08/2020 0336   RBC 3.24 (L) 04/08/2020 0336   HGB 9.5 (L) 04/08/2020 0336   HGB 13.3 12/17/2013 1538   HCT 29.4 (L) 04/08/2020 0336   HCT 40.6 12/17/2013 1538   PLT 219 04/08/2020 0336   PLT 219 12/17/2013 1538   MCV 90.7 04/08/2020 0336   MCV 90.3 12/17/2013 1538   MCH 29.3 04/08/2020 0336   MCHC 32.3 04/08/2020 0336   RDW 15.3 04/08/2020 0336   RDW 13.5 12/17/2013 1538   LYMPHSABS 1.1 03/30/2020 0922   LYMPHSABS 1.4 12/17/2013 1538   MONOABS 0.6 03/30/2020 0922   MONOABS 0.3 12/17/2013 1538   EOSABS 0.0 03/30/2020 0922   EOSABS 0.0 12/17/2013 1538   BASOSABS 0.0 03/30/2020 0922   BASOSABS 0.0 12/17/2013 1538    BMET    Component Value Date/Time   NA 137 04/08/2020 0336   NA 144 01/21/2018 1000   NA 142 12/17/2013 1538   K 3.4 (L) 04/08/2020 0336   K 4.1 12/17/2013 1538   CL 103 04/08/2020 0336   CL 106 12/07/2012 0838   CO2 25 04/08/2020 0336   CO2 28 12/17/2013 1538   GLUCOSE 103 (H) 04/08/2020 0336   GLUCOSE 123 12/17/2013 1538   GLUCOSE 119 (H) 12/07/2012 0838   BUN 6 04/08/2020 0336   BUN 12 01/21/2018 1000   BUN 14.1 12/17/2013 1538   CREATININE 0.75 04/08/2020 0336   CREATININE 0.96 10/27/2015 0904   CREATININE 0.8 12/17/2013 1538   CALCIUM 8.0 (L) 04/08/2020 0336   CALCIUM 9.7 12/17/2013 1538   GFRNONAA >60 04/08/2020 0336   GFRAA >60 04/08/2020 0336    INR    Component Value Date/Time   INR 1.5 (H) 03/21/2020 2143   INR 2.10 12/07/2012 1025     Intake/Output Summary (Last 24 hours) at 04/08/2020 1101 Last data filed at 04/08/2020  0824 Gross per 24 hour  Intake 1579.82 ml  Output 1650 ml  Net -70.18 ml     Assessment/plan:  54 y.o. female is s/p AngioJet mechanical thrombectomy with filter removal with RP hematoma now with ileus that is resolving.  General surgery following their input much appreciated possible NG tube DC tomorrow.  Continue heparin drip until patient tolerating p.o.    Javiel Canepa C. Donzetta Matters, MD [facility] Office: [phone] Pager: [phone]  04/08/2020 11:01 AM

## 2020-04-08 NOTE — Progress Notes (Signed)
Mobility Specialist: Progress Note    04/08/20 1404  Mobility  Activity Ambulated in hall  Level of Assistance Modified independent, requires aide device or extra time  Assistive Device Front wheel walker  Distance Ambulated (ft) 160 ft  Mobility Response Tolerated well  Mobility performed by Mobility specialist  Bed Position Chair  $Mobility charge 1 Mobility   Pre-Mobility: 80 HR, 145/77 BP Post-Mobility: 83 HR, 137/70 BP  Pt had no c/o SOB, dizziness, or feeling light headed. Pt said she felt better after ambulation.   [facility] [name] Mobility Specialist

## 2020-04-08 NOTE — Progress Notes (Addendum)
ANTICOAGULATION CONSULT NOTE  Pharmacy Consult:  Heparin Indication: clotted IVC and DVTs   Heparin dosing weight: 101 kg  Labs: Recent Labs    04/06/20 0855 04/07/20 0156 04/07/20 1045 04/08/20 0336  HGB  --  9.6*  --  9.5*  HCT  --  29.8*  --  29.4*  PLT  --  206  --  219  APTT  --  65* 66* 60*  HEPARINUNFRC  --  1.50*  --  0.10*  CREATININE 0.91 0.91  --  0.75    Assessment: 66 yoF with PMH prior PE and DVT s/p IVC in 2012 on no anticoagulation at home admitted with iliac vein thrombus and occluded IVC filter. 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 Patient is s/p catheter-directed lysis and mechanical thrombectomy on 6/8 and 6/9 with no acute residual thrombus.  She was then transitioned to Xarelto, but now with SBO so pharmacy consulted to restart IV heparin.  Last Xarelto dose was on 04/05/20 around 1800, so will need to dose heparin based on aPTT until heparin level and aPTT correlate.    6 hour aPTT is subtherapeutic at 56 seconds on 1100 units/hour. Heparin level likely correlating with aPTT since heparin level at 0.1. H&H is stable at 9.5/29.4, plts wnl at 219. No bleeding noted. Given concern of bleeding with lack of therapeutic anticoagulation, will give small heparin bolus.   Goal of Therapy:  APTT 66-102 sec Heparin level 0.3-0.7 units/mL Monitor platelets per protocol: yes    Plan:  Bolus heparin with 750 units Increase heparin infusion to 1300 units/hour F/u 6 hour HL Daily CBC and heparin level Monitor for bleeding  Monitor for resolution of SBO to resume Xarelto    Thank you,   Eddie Candle, PharmD PGY-1 Pharmacy Resident   Please check amion for clinical pharmacist contact number

## 2020-04-08 NOTE — Progress Notes (Signed)
ANTICOAGULATION CONSULT NOTE  Pharmacy Consult:  Heparin Indication: clotted IVC and DVTs   Heparin dosing weight: 101 kg  Labs: Recent Labs    04/06/20 0855 04/07/20 0156 04/07/20 0156 04/07/20 1045 04/08/20 0336 04/08/20 1037 04/08/20 1847  HGB  --  9.6*  --   --  9.5*  --   --   HCT  --  29.8*  --   --  29.4*  --   --   PLT  --  206  --   --  219  --   --   APTT  --  65*   < > 66* 60* 56*  --   HEPARINUNFRC  --  1.50*   < >  --  0.10* 0.10* 0.21*  CREATININE 0.91 0.91  --   --  0.75  --   --    < > = values in this interval not displayed.    Assessment: 36 yoF with PMH prior PE and DVT s/p IVC in 2012 on no anticoagulation at home admitted with iliac vein thrombus and occluded IVC filter. 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 Patient is s/p catheter-directed lysis and mechanical thrombectomy on 6/8 and 6/9 with no acute residual thrombus.  She was then transitioned to Xarelto, but now with SBO so pharmacy consulted to restart IV heparin.  Last Xarelto dose was on 04/05/20 around 1800.  Heparin level remains subtherapeutic this evening at 0.21.  Goal of Therapy:  APTT 66-102 sec Heparin level 0.3-0.7 units/mL Monitor platelets per protocol: yes    Plan:  -Increase heparin infusion to 1550 units/h -Recheck heparin level in 6hr   Arrie Senate, PharmD, BCPS Clinical Pharmacist [phone] Please check AMION for all [place] numbers 04/08/2020

## 2020-04-08 NOTE — Progress Notes (Signed)
ANTICOAGULATION CONSULT NOTE  Pharmacy Consult:  Heparin Indication: clotted IVC and DVTs  Labs: Recent Labs    04/06/20 0855 04/07/20 0156 04/07/20 1045 04/08/20 0336  HGB  --  9.6*  --  9.5*  HCT  --  29.8*  --  29.4*  PLT  --  206  --  219  APTT  --  65* 66* 60*  HEPARINUNFRC  --  1.50*  --  0.10*  CREATININE 0.91 0.91  --  0.75    Assessment: 54 yo female with h/O PE/DVT s/p recent thrombectomy, noow with SBO Xarelto on hold, for heparin   Goal of Therapy:  APTT 66-102 sec Heparin level 0.3-0.7 units/mL Monitor platelets per protocol: yes    Plan:  Increase Heparin 1100 units/hr Check heparin level in 8 hours.  Phillis Knack, PharmD, BCPS

## 2020-04-09 LAB — BASIC METABOLIC PANEL
Anion gap: 11 (ref 5–15)
BUN: <5 mg/dL — ABNORMAL LOW (ref 6–20)
CO2: 26 mmol/L (ref 22–32)
Calcium: 8.3 mg/dL — ABNORMAL LOW (ref 8.9–10.3)
Chloride: 100 mmol/L (ref 98–111)
Creatinine, Ser: 0.71 mg/dL (ref 0.44–1.00)
GFR calc Af Amer: >60 mL/min (ref >60)
GFR calc non Af Amer: >60 mL/min (ref >60)
Glucose, Bld: 166 mg/dL — ABNORMAL HIGH (ref 70–99)
Potassium: 3.1 mmol/L — ABNORMAL LOW (ref 3.5–5.1)
Sodium: 137 mmol/L (ref 135–145)

## 2020-04-09 LAB — CBC
HCT: 31 % — ABNORMAL LOW (ref 36.0–46.0)
Hemoglobin: 10 g/dL — ABNORMAL LOW (ref 12.0–15.0)
MCH: 29.5 pg (ref 26.0–34.0)
MCHC: 32.3 g/dL (ref 30.0–36.0)
MCV: 91.4 fL (ref 80.0–100.0)
Platelets: 237 10*3/uL (ref 150–400)
RBC: 3.39 MIL/uL — ABNORMAL LOW (ref 3.87–5.11)
RDW: 15.2 % (ref 11.5–15.5)
WBC: 7 10*3/uL (ref 4.0–10.5)
nRBC: 0 % (ref 0.0–0.2)

## 2020-04-09 LAB — GLUCOSE, CAPILLARY
Glucose-Capillary: 149 mg/dL — ABNORMAL HIGH (ref 70–99)
Glucose-Capillary: 145 mg/dL — ABNORMAL HIGH (ref 70–99)
Glucose-Capillary: 147 mg/dL — ABNORMAL HIGH (ref 70–99)
Glucose-Capillary: 139 mg/dL — ABNORMAL HIGH (ref 70–99)

## 2020-04-09 LAB — HEPARIN LEVEL (UNFRACTIONATED)
Heparin Unfractionated: 0.43 IU/mL (ref 0.30–0.70)
Heparin Unfractionated: 0.56 IU/mL (ref 0.30–0.70)

## 2020-04-09 LAB — MAGNESIUM: Magnesium: 1.5 mg/dL — ABNORMAL LOW (ref 1.7–2.4)

## 2020-04-09 MED ORDER — POTASSIUM CHLORIDE CRYS ER 20 MEQ PO TBCR
40.0000 meq | EXTENDED_RELEASE_TABLET | Freq: Once | ORAL | Status: AC
  Administered 2020-04-09: 40 meq via ORAL
  Filled 2020-04-09: qty 2

## 2020-04-09 MED ORDER — MAGNESIUM SULFATE 4 GM/100ML IV SOLN
4.0000 g | Freq: Once | INTRAVENOUS | Status: AC
  Administered 2020-04-09: 4 g via INTRAVENOUS
  Filled 2020-04-09: qty 100

## 2020-04-09 MED ORDER — POTASSIUM CHLORIDE CRYS ER 20 MEQ PO TBCR
20.0000 meq | EXTENDED_RELEASE_TABLET | Freq: Once | ORAL | Status: AC
  Administered 2020-04-09: 20 meq via ORAL
  Filled 2020-04-09: qty 1

## 2020-04-09 NOTE — Progress Notes (Signed)
Lab staff attempted to get labs from patient but she was unsuccessful due to patient been a difficult stick. She stated that another lab staff will come and  get the ordered labs. No sign of bleeding noted, patient resting quietly in the bed, vital signs are stable will continue to monitor.

## 2020-04-09 NOTE — Progress Notes (Signed)
Ambulated 400+ feet with walker, independent after set up. Tolerated well, will walk again later this shift.

## 2020-04-09 NOTE — Progress Notes (Addendum)
ANTICOAGULATION CONSULT NOTE  Pharmacy Consult:  Heparin Indication: clotted IVC and DVTs   Heparin dosing weight: 101 kg  Labs: Recent Labs    04/07/20 0156 04/07/20 0156 04/07/20 1045 04/08/20 0336 04/08/20 0336 04/08/20 1037 04/08/20 1847 04/09/20 0520  HGB 9.6*   < >  --  9.5*  --   --   --  10.0*  HCT 29.8*  --   --  29.4*  --   --   --  31.0*  PLT 206  --   --  219  --   --   --  237  APTT 65*   < > 66* 60*  --  56*  --   --   HEPARINUNFRC 1.50*   < >  --  0.10*   < > 0.10* 0.21* 0.43  CREATININE 0.91  --   --  0.75  --   --   --  0.71   < > = values in this interval not displayed.    Assessment: 67 yoF with PMH prior PE and DVT s/p IVC in 2012 on no anticoagulation at home admitted with iliac vein thrombus and occluded IVC filter. 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 Patient is s/p catheter-directed lysis and mechanical thrombectomy on 6/8 and 6/9 with no acute residual thrombus.  She was then transitioned to Xarelto, but now with SBO so pharmacy consulted to restart IV heparin.  Last Xarelto dose was on 04/05/20 around 1800.   Heparin level this morning therapeutic at 0.43 after rate increase to 1550 units/hour. H&H stable at 10/31, plts wnl at 237. Some improvement in PO status so far but unclear if at baseline. RN notes patient was difficult to obtain labs in this morning.  Goal of Therapy:  Heparin level 0.3-0.7 units/mL Monitor platelets per protocol: yes    Plan:  Continue heparin infusion at 1550 units/hour F/u 6 hour cHL  Daily CBC and heparin level Monitor for bleeding  Monitor for resolution of SBO to resume Xarelto   **addendum 1303** Confirmatory heparin level remains therapeutic at 0.56 on 1550 units/hour. Will keep on same rate and check heparin level tomorrow with morning labs. Will hopefully transition back to [place] soon as ileus resolves.   Thank you,    Eddie Candle, PharmD PGY-1 Pharmacy Resident   Please check amion for clinical pharmacist contact number

## 2020-04-09 NOTE — Progress Notes (Signed)
Mobility Specialist: Progress Note    04/09/20 1501  Mobility  Activity Ambulated in hall  Level of Assistance Modified independent, requires aide device or extra time  Assistive Device Front wheel walker  Distance Ambulated (ft) 470 ft  Mobility Response Tolerated well  Mobility performed by Mobility specialist  Bed Position Chair  $Mobility charge 1 Mobility   Pre-Mobility: 93 HR, 118/66 BP, 100% SpO2 Post-Mobility: 94 HR, 116/79 BP, 98% SpO2   Psychologist, clinical

## 2020-04-09 NOTE — Progress Notes (Signed)
11 Days Post-Op  Subjective: Feels great.  No nausea with clears and NGT clamped.  Still passing flatus and BMs.  No abdominal pain  ROS: See above, otherwise other systems negative  Objective: Vital signs in last 24 hours: Temp:  [97.8 F (36.6 C)-99.2 F (37.3 C)] 98.4 F (36.9 C) (06/20 0747) Pulse Rate:  [70-76] 74 (06/20 0747) Resp:  [16-19] 16 (06/20 0747) BP: (137-156)/(80-89) 138/83 (06/20 0747) SpO2:  [96 %-100 %] 96 % (06/20 0747) Weight:  [97 kg] 97 kg (06/20 0715) Last BM Date: 04/08/20  Intake/Output from previous day: 06/19 0701 - 06/20 0700 In: 1660.4 [I.V.:1660.4] Out: 3100 [Urine:3100] Intake/Output this shift: Total I/O In: -  Out: 600 [Urine:600]  PE: Abd: soft, NT, ND, +BS, NGT removed  Lab Results:  Recent Labs    04/08/20 0336 04/09/20 0520  WBC 7.1 7.0  HGB 9.5* 10.0*  HCT 29.4* 31.0*  PLT 219 237   BMET Recent Labs    04/08/20 0336 04/09/20 0520  NA 137 137  K 3.4* 3.1*  CL 103 100  CO2 25 26  GLUCOSE 103* 166*  BUN 6 <5*  CREATININE 0.75 0.71  CALCIUM 8.0* 8.3*   PT/INR No results for input(s): LABPROT, INR in the last 72 hours. CMP     Component Value Date/Time   NA 137 04/09/2020 0520   NA 144 01/21/2018 1000   NA 142 12/17/2013 1538   K 3.1 (L) 04/09/2020 0520   K 4.1 12/17/2013 1538   CL 100 04/09/2020 0520   CL 106 12/07/2012 0838   CO2 26 04/09/2020 0520   CO2 28 12/17/2013 1538   GLUCOSE 166 (H) 04/09/2020 0520   GLUCOSE 123 12/17/2013 1538   GLUCOSE 119 (H) 12/07/2012 0838   BUN <5 (L) 04/09/2020 0520   BUN 12 01/21/2018 1000   BUN 14.1 12/17/2013 1538   CREATININE 0.71 04/09/2020 0520   CREATININE 0.96 10/27/2015 0904   CREATININE 0.8 12/17/2013 1538   CALCIUM 8.3 (L) 04/09/2020 0520   CALCIUM 9.7 12/17/2013 1538   PROT 6.3 (L) 04/06/2020 0855   PROT 7.7 12/17/2013 1538   ALBUMIN 2.8 (L) 04/06/2020 0855   ALBUMIN 4.0 12/17/2013 1538   AST 23 04/06/2020 0855   AST 15 12/17/2013 1538   ALT 25  04/06/2020 0855   ALT 19 12/17/2013 1538   ALKPHOS 76 04/06/2020 0855   ALKPHOS 147 12/17/2013 1538   BILITOT 2.5 (H) 04/06/2020 0855   BILITOT 0.30 12/17/2013 1538   GFRNONAA >60 04/09/2020 0520   GFRAA >60 04/09/2020 0520   Lipase     Component Value Date/Time   LIPASE 24 04/06/2020 0855       Studies/Results: DG Abd Portable 1V  Result Date: 04/08/2020 CLINICAL DATA:  Initial evaluation for acute diarrhea. EXAM: PORTABLE ABDOMEN - 1 VIEW COMPARISON:  Prior radiograph from 04/07/2020 FINDINGS: Enteric tube in place with tip overlying the region of the pylorus, side hole well beyond the GE junction. There has been interval migration of contrast material since previous exam, now seen within the ascending, transverse, and descending colon. Few nondilated gas-filled loops of small bowel seen within the left and mid abdomen, similar to previous. No appreciable abnormal bowel wall thickening. No free air on these limited supine views of the abdomen. IVC filter overlying the right abdomen again noted. IMPRESSION: 1. Interval migration of contrast material since previous exam, now seen entirely within the nondilated colon. Few persistent mildly prominent gas-filled loops of small bowel  overlie the mid and left abdomen, similar to previous. 2. Enteric tube in place with tip overlying the region of the pylorus. Electronically Signed   By: Jeannine Boga M.D.   On: 04/08/2020 08:18   DG Abd Portable 1V-Small Bowel Obstruction Protocol-initial, 8 hr delay  Result Date: 04/07/2020 CLINICAL DATA:  Bowel obstruction EXAM: PORTABLE ABDOMEN - 1 VIEW COMPARISON:  04/06/2020, CT 04/06/2020 FINDINGS: Esophageal tube tip in the right upper quadrant. Dilute contrast within mildly prominent left abdominal small bowel loops with contrast present in the colon. IVC stent. IMPRESSION: Overall decreased small bowel dilatation compared to prior. Dilute contrast present within left abdominal small bowel loops as  well as the colon. Electronically Signed   By: Donavan Foil M.D.   On: 04/07/2020 21:00    Anti-infectives: Anti-infectives (From admission, onward)   Start     Dose/Rate Route Frequency Ordered Stop   03/23/20 2200  azithromycin (ZITHROMAX) tablet 500 mg  Status:  Discontinued        500 mg Oral Daily at bedtime 03/23/20 0806 03/25/20 1233   03/23/20 1000  remdesivir 100 mg in sodium chloride 0.9 % 100 mL IVPB       "Followed by" Linked Group Details   100 mg 200 mL/hr over 30 Minutes Intravenous Daily 03/22/20 0110 03/26/20 1913   03/22/20 2200  azithromycin (ZITHROMAX) 500 mg in sodium chloride 0.9 % 250 mL IVPB  Status:  Discontinued        500 mg 250 mL/hr over 60 Minutes Intravenous Every 24 hours 03/22/20 0111 03/23/20 0806   03/22/20 2200  cefTRIAXone (ROCEPHIN) 1 g in sodium chloride 0.9 % 100 mL IVPB  Status:  Discontinued        1 g 200 mL/hr over 30 Minutes Intravenous Every 24 hours 03/22/20 0111 03/25/20 1233   03/22/20 0200  remdesivir 200 mg in sodium chloride 0.9% 250 mL IVPB       "Followed by" Linked Group Details   200 mg 580 mL/hr over 30 Minutes Intravenous Once 03/22/20 0110 03/22/20 1900   03/22/20 0015  cefTRIAXone (ROCEPHIN) 1 g in sodium chloride 0.9 % 100 mL IVPB        1 g 200 mL/hr over 30 Minutes Intravenous  Once 03/22/20 0010 03/22/20 0103   03/21/20 2300  cefTRIAXone (ROCEPHIN) injection 1 g  Status:  Discontinued        1 g Intramuscular  Once 03/21/20 2253 03/22/20 0010   03/21/20 2300  azithromycin (ZITHROMAX) 500 mg in sodium chloride 0.9 % 250 mL IVPB        500 mg 250 mL/hr over 60 Minutes Intravenous  Once 03/21/20 2253 03/22/20 0129       Assessment/Plan H/O colon cancer, s/p resection 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  Will defer  further care to primary service and vascular team  FEN -soft diet VTE -heparin gtt ID -none currently   LOS: 18 days    Henreitta Cea , [facility] 04/09/2020, 10:02 AM Please see Amion for pager number during day hours 7:00am-4:30pm or 7:00am -11:30am on weekends

## 2020-04-09 NOTE — Progress Notes (Signed)
PROGRESS NOTE                                                                                                                                                                                                             Patient Demographics:    [name], is a 54 y.o. female, DOB - [birth year], MOQ:[id number]  Outpatient Primary MD for the patient is Glendale Chard, MD   Admit date - 03/21/2020   LOS - 47  Chief Complaint  Patient presents with  . Hypotension  . Back Pain       Brief Narrative: Patient is a 54 y.o. female with PMHx of DM-2, HLD, hypothyroidism, PE-s/p IVC filter in 2012, history of colon cancer-diagnosed with COVID-19 on 5/21-presented with fatigue/malaise, low back pain-found to have patchy airspace disease on CTA chest, MRI thoracic/lumbar spine showed T7 disc herniation-but also showed extensive clot in the IVC.  Subsequently a Doppler showed extensive DVT in the bilateral lower extremity.  She was started on IV heparin-vascular surgery was consulted underwent catheter guided lytic therapy-subsequent mechanical thrombectomy/IVC filter removal-complicated by IVC tear requiring IVC stenting.  Postoperatively-developed acute blood loss anemia with hypovolemic shock secondary to blood loss-monitored in the ICU-briefly required pressors.  Once stabilized-she was transferred to a medical unit-while awaiting CIR bed-she started developing vomiting-ultimately found to have ileus/partial small bowel obstruction.  See below for further details.  Significant Events: 6/1>> admit to [facility] for low back pain/weakness/malaise-found to have COVID-19 pneumonia on CT imaging. 6/2>> lower extremity Doppler/MRI LS spine-significant lower extremity DVT extending up to IVC filter 6/8>> catheter guided lytic therapy for bilateral lower extremity DVT 6/9>> mechanical thrombectomy/IVC filter removal 6/10>> hypotension overnight-requiring  initiation of Levophed 6/11>> off Levophed since 6/10 evening-transfer out of ICU 6/17>> worsening of vomiting-found to have ileus/SBO-NG tube inserted.  Significant studies: 6/1>> CTA chest: No PE-multifocal patchy airspace opacities throughout both lungs. 6/1>> CT abdomen/pelvis: Hepatic steatosis, aortic atherosclerosis 6/2>> MRI thoracic spine: T7-8 central to left-sided disc herniation 6/2>> MRI lumbar spine: No significant disc pathology/no stenosis or neurocompression.  Thrombosis of the IVC, both iliac veins to the level of the IVC filter. 6/2>> lower extremity Doppler: Bilateral lower extremity extensive DVT. 6/17>> CT abdomen/pelvis: High-grade small bowel obstruction, right-sided retroperitoneal hematoma  Procedures: 6/8>>  #1: Ultrasound-guided access, bilateral popliteal vein  #2: Intravascular ultrasound, bilateral femoral, common  femoral, external iliac, common iliac veins, and inferior vena cava #3: Placement of bilateral intravenous infusion catheter for thrombolytic therapy into the vena cava.                         #4: Initiation of thrombolytic therapy (TPA into the inferior vena cava, common iliac, external iliac, common femoral, and femoral veins  6/9>> 1.  Ultrasound-guided cannulation of right internal jugular vein 2.  Ultrasound-guided cannulation of left internal jugular vein 3.  Ultrasound-guided placement of 7 French double-lumen central venous line and left internal jugular vein 4.  Intravascular ultrasound of bilateral femoral, bilateral common femoral, bilateral external iliac and bilateral common iliac veins and IVC 5.  IVC venogram 6.  Angio VAC and cleaner wire mechanical thrombectomy of IVC, bilateral common iliac, bilateral external iliac, bilateral common femoral and bilateral femoral veins 7.  Stent of IVC with 18 x 90 mm Wallstent 8.  Ultrasound-guided cannulation right common femoral vein 9.  Veno venous bypass for 58 minutes  COVID-19  medications: Steroids:6/1>>6/9 Remdesivir:6/1>>6/6  Antibiotics: Zithromax: 6/1>> 6/4 Rocephin: 6/1>> 6/4  Microbiology data: 6/1>> blood culture: Negative 6/2>> urine culture: Negative  Consults: VVS CCS  DVT prophylaxis: Xarelto>> to IV heparin as n.p.o./NG tube in place   Subjective:   Patient in bed, appears comfortable, denies any headache, no fever, no chest pain or pressure, no shortness of breath , no abdominal pain. No focal weakness. No nausea, +ve BMs.   Assessment  & Plan :   Sepsis secondary to Covid 19 Viral pneumonia: Sepsis physiology has resolved-has completed a course of steroids/remdesivir.  Remains stable on room air.  This problem has resolved.   Extensive bilateral lower extremity DVTs-involving bilateral iliac veins and extending to IVC up to the level of IVC filter: s/p bilateral popliteal lytic catheter placement on 6/8-and mechanical thrombectomy/IVC filter on 6/9-vascular surgery following.  Was on argatroban due to thrombocytopenia-HIT antibody negative-platelet counts improving-had been transitioned to Xarelto on 6/14.  But due to ileus/SBO-placed back on IV heparin-with plans to transition back to Xarelto when SBO resolves.  Hypovolemic shock: Resolved-secondary to blood loss related to IVC tear during IVC filter removal-s/p stenting-resuscitated with IV fluids-briefly required Levophed while in the ICU.  BP now stable.  Acute blood loss anemia: Secondary to perioperative blood loss-see operative note-follow CBC-no indication for transfusion.  Hemoglobin slowly improving.  Thrombocytopenia: Continues to have stable thrombocytopenia-HIT antibody negative.  Platelet count slowly improving-follow closely.  Small bowel obstruction/ileus: CCS following.  NG in place but started having bowel movements night of 04/07/2020, likely resolved, DC NG per CCS, clears PO.  AKI: Likely hemodynamically mediated-resolved  Hypokalemia and hypomagnesemia.  Replaced  both.  T7-T8 disc herniation: Prior MD - Dr. Waldron Labs already discussed MRI findings with Dr. Jenkins-recommendations were for outpatient follow-up.  Hypothyroidism: Continue Synthroid.  TSH mildly elevated-not sure when her Synthroid dosing was adjusted last-stable for outpatient follow-up/adjustment with PCP.  HLD: Continue statin  Deconditioning/debility: Secondary to acute illness/blood loss anemia/numerous procedures-has nausea/pain/tachycardia with ambulation -evaluated by rehab services-recommendations are for CIR  DM-2 (A1c 8.6) with uncontrolled hyperglycemia with mild DKA: CBG stable with SSI and Tradjenta.  Long-acting insulin discontinued on 6/14 as CBGs only in the 80s/90s.  Poor outpatient control due to hyperglycemia.  Recent Labs    04/07/20 2119 04/08/20 0011 04/08/20 0624  GLUCAP 77 92 80   Lab Results  Component Value Date   HGBA1C 8.6 (H) 03/23/2020    Nutrition Problem:  Nutrition Problem: Increased nutrient needs Etiology: acute illness (COVID) Signs/Symptoms: estimated needs Interventions: Glucerna shake      Condition -Stable  Family Communication  : spouse over the phone 6/18  Code Status :  Full Code  Diet :   Diet Order            Diet clear liquid Room service appropriate? Yes; Fluid consistency: Thin  Diet effective now                  Disposition Plan  :  Status is: Inpatient  Remains inpatient appropriate because:Inpatient level of care appropriate due to severity of illness  Dispo: The patient is from: Home              Anticipated d/c is to: HHPT vs ? CIR              Anticipated d/c date is: 2 days              Patient currently is not medically stable to d/c.  Barriers to discharge: Ileus/partial small bowel obstruction-requiring NG tube decompression-and other conservative measures.  Antimicorbials  :    Anti-infectives (From admission, onward)   Start     Dose/Rate Route Frequency Ordered Stop   03/23/20 2200   azithromycin (ZITHROMAX) tablet 500 mg  Status:  Discontinued        500 mg Oral Daily at bedtime 03/23/20 0806 03/25/20 1233   03/23/20 1000  remdesivir 100 mg in sodium chloride 0.9 % 100 mL IVPB       "Followed by" Linked Group Details   100 mg 200 mL/hr over 30 Minutes Intravenous Daily 03/22/20 0110 03/26/20 1913   03/22/20 2200  azithromycin (ZITHROMAX) 500 mg in sodium chloride 0.9 % 250 mL IVPB  Status:  Discontinued        500 mg 250 mL/hr over 60 Minutes Intravenous Every 24 hours 03/22/20 0111 03/23/20 0806   03/22/20 2200  cefTRIAXone (ROCEPHIN) 1 g in sodium chloride 0.9 % 100 mL IVPB  Status:  Discontinued        1 g 200 mL/hr over 30 Minutes Intravenous Every 24 hours 03/22/20 0111 03/25/20 1233   03/22/20 0200  remdesivir 200 mg in sodium chloride 0.9% 250 mL IVPB       "Followed by" Linked Group Details   200 mg 580 mL/hr over 30 Minutes Intravenous Once 03/22/20 0110 03/22/20 1900   03/22/20 0015  cefTRIAXone (ROCEPHIN) 1 g in sodium chloride 0.9 % 100 mL IVPB        1 g 200 mL/hr over 30 Minutes Intravenous  Once 03/22/20 0010 03/22/20 0103   03/21/20 2300  cefTRIAXone (ROCEPHIN) injection 1 g  Status:  Discontinued        1 g Intramuscular  Once 03/21/20 2253 03/22/20 0010   03/21/20 2300  azithromycin (ZITHROMAX) 500 mg in sodium chloride 0.9 % 250 mL IVPB        500 mg 250 mL/hr over 60 Minutes Intravenous  Once 03/21/20 2253 03/22/20 0129      Inpatient Medications  Scheduled Meds: . Chlorhexidine Gluconate Cloth  6 each Topical Daily  . insulin aspart  0-15 Units Subcutaneous TID WC  . insulin aspart  0-5 Units Subcutaneous QHS  . levothyroxine  50 mcg Intravenous Daily  . mupirocin ointment   Topical BID  . pantoprazole (PROTONIX) IV  40 mg Intravenous Q24H  . potassium chloride  20 mEq Oral Once  . sodium chloride flush  10-40 mL Intracatheter Q12H  . sodium chloride flush  3 mL Intravenous Q12H   Continuous Infusions: . dextrose 5% lactated ringers  with KCl 20 mEq/L 50 mL/hr at 04/09/20 0832  . heparin 1,550 Units/hr (04/08/20 2030)  . magnesium sulfate bolus IVPB 4 g (04/09/20 0842)  . sodium chloride     PRN Meds:.acetaminophen, albuterol, guaiFENesin-dextromethorphan, morphine injection, ondansetron **OR** ondansetron (ZOFRAN) IV, promethazine, sodium chloride, sodium chloride flush, zolpidem   Time Spent in minutes  25  See all Orders from today for further details   Lala Lund M.D on 04/09/2020 at 8:43 AM  To page go to [email or web address] - use universal password  [facility] -  Office  [phone]    Objective:   Vitals:   04/08/20 2318 04/09/20 0325 04/09/20 0715 04/09/20 0747  BP: (!) 143/80 137/82  138/83  Pulse: 71 72 70 74  Resp: 19 17  16   Temp: 98.6 F (37 C) 97.8 F (36.6 C)  98.4 F (36.9 C)  TempSrc: Oral Oral  Oral  SpO2: 99% 98% 99% 96%  Weight:   97 kg   Height:        Wt Readings from Last 3 Encounters:  04/09/20 97 kg  02/15/20 91.2 kg  08/02/19 95.3 kg     Intake/Output Summary (Last 24 hours) at 04/09/2020 0843 Last data filed at 04/09/2020 0747 Gross per 24 hour  Intake 1660.36 ml  Output 2800 ml  Net -1139.64 ml    Physical Exam  Awake Alert, No new F.N deficits, Normal affect Anderson.AT,PERRAL Supple Neck,No JVD, No cervical lymphadenopathy appriciated.  Symmetrical Chest wall movement, Good air movement bilaterally, CTAB RRR,No Gallops, Rubs or new Murmurs, No Parasternal Heave +ve B.Sounds, Abd Soft, No tenderness, No organomegaly appriciated, No rebound - guarding or rigidity. NG in place. No Cyanosis, Clubbing or edema, No new Rash or bruise    Data Review:    CBC Recent Labs  Lab 04/04/20 0238 04/05/20 0351 04/07/20 0156 04/08/20 0336 04/09/20 0520  WBC 11.5* 12.5* 9.0 7.1 7.0  HGB 9.8* 10.0* 9.6* 9.5* 10.0*  HCT 30.3* 30.8* 29.8* 29.4* 31.0*  PLT 114* 150 206 219 237  MCV 92.1 92.8 92.0 90.7 91.4  MCH 29.8 30.1 29.6 29.3 29.5  MCHC 32.3 32.5 32.2 32.3  32.3  RDW 16.7* 17.0* 16.0* 15.3 15.2    Chemistries  Recent Labs  Lab 04/06/20 0855 04/07/20 0156 04/08/20 0336 04/09/20 0520  NA 136 139 137 137  K 4.2 4.3 3.4* 3.1*  CL 98 102 103 100  CO2 28 27 25 26   GLUCOSE 113* 119* 103* 166*  BUN 14 10 6  <5*  CREATININE 0.91 0.91 0.75 0.71  CALCIUM 9.0 8.6* 8.0* 8.3*  MG  --   --  1.4* 1.5*  AST 23  --   --   --   ALT 25  --   --   --   ALKPHOS 76  --   --   --   BILITOT 2.5*  --   --   --    ------------------------------------------------------------------------------------------------------------------ No results for input(s): CHOL, HDL, LDLCALC, TRIG, CHOLHDL, LDLDIRECT in the last 72 hours.  Lab Results  Component Value Date   HGBA1C 8.6 (H) 03/23/2020   ------------------------------------------------------------------------------------------------------------------ No results for input(s): TSH, T4TOTAL, T3FREE, THYROIDAB in the last 72 hours.  Invalid input(s): FREET3 ------------------------------------------------------------------------------------------------------------------ No results for input(s): VITAMINB12, FOLATE, FERRITIN, TIBC, IRON, RETICCTPCT in the last 72 hours.  Coagulation profile No results for input(s): INR, PROTIME  in the last 168 hours.  No results for input(s): DDIMER in the last 72 hours.  Cardiac Enzymes No results for input(s): CKMB, TROPONINI, MYOGLOBIN in the last 168 hours.  Invalid input(s): CK ------------------------------------------------------------------------------------------------------------------    Component Value Date/Time   BNP 29.9 03/21/2020 2148    Micro Results No results found for this or any previous visit (from the past 240 hour(s)).  Radiology Reports CT ABDOMEN PELVIS WO CONTRAST  Result Date: 04/06/2020 CLINICAL DATA:  Nausea and vomiting, intermittent for several days, sepsis, abdominal distension, recent procedure for removal of IVC filter/thrombectomy  with development of IVC perforation EXAM: CT ABDOMEN AND PELVIS WITHOUT CONTRAST TECHNIQUE: Multidetector CT imaging of the abdomen and pelvis was performed following the standard protocol without IV contrast. COMPARISON:  04/06/2020 FINDINGS: Lower chest: Trace left pleural effusion. Minimal dependent bilateral lower lobe atelectasis. Hepatobiliary: The dome of the liver is excluded by slice selection. Visualized portions of the unenhanced liver unremarkable. Gallbladder is decompressed, without focal abnormality. Pancreas: Unremarkable. No pancreatic ductal dilatation or surrounding inflammatory changes. Spleen: Normal in size without focal abnormality. Adrenals/Urinary Tract: No urinary tract calculi. There is mild distension of the renal pelves and ureters, likely due to significant bladder distension. The adrenals are unremarkable. Stomach/Bowel: There is evidence of high-grade small bowel obstruction. There is marked distension of the duodenum and jejunum. The distal jejunum and ileum are decompressed. Exact point of transition is not well visualized. Colon is decompressed. No bowel wall thickening Vascular/Lymphatic: Since the prior exam, the IVC filter has been removed and a stent placed within the IVC. There is evidence of retroperitoneal hematoma related to known IVC perforation intraoperatively. This measures up to 2.5 cm in thickness. Based on size, this is likely the sequela of the intraoperative perforation and there is no significant mass effect to suggest ongoing bleeding. Evaluation is limited without IV contrast. Minimal atherosclerosis of the aorta. No pathologic adenopathy. Reproductive: Uterus and bilateral adnexa are unremarkable. Other: There is no free intraperitoneal fluid or free gas. No abdominal wall hernia. Musculoskeletal: No acute or destructive bony lesions. Reconstructed images demonstrate no additional findings. IMPRESSION: 1. High-grade small bowel obstruction. Exact point of  transition not well visualized, likely in the distal jejunum or proximal ileum. 2. Right-sided retroperitoneal hematoma, likely due to the intraoperative IVC perforation during IVC filter removal. IVC stent placed in the interim. 3. Trace left pleural effusion. Electronically Signed   By: Randa Ngo M.D.   On: 04/06/2020 16:00   DG Abd 1 View  Result Date: 04/06/2020 CLINICAL DATA:  Nasogastric tube placement EXAM: ABDOMEN - 1 VIEW COMPARISON:  CT abdomen and pelvis April 06, 2020 FINDINGS: Nasogastric tube tip and side port in stomach. There are multiple loops of mildly dilated bowel. No free air evident. Lung bases clear. Stent noted in inferior vena cava region. IMPRESSION: Nasogastric tube tip and side port in stomach. Loops of mildly dilated bowel, slightly less pronounced compared to earlier in the day. No free air evident. Lung bases clear. Electronically Signed   By: Lowella Grip III M.D.   On: 04/06/2020 17:59   CT Angio Chest PE W and/or Wo Contrast  Result Date: 03/22/2020 CLINICAL DATA:  Severe back pain hypotension and shallow EXAM: CT ANGIOGRAPHY CHEST WITH CONTRAST TECHNIQUE: Multidetector CT imaging of the chest was performed using the standard protocol during bolus administration of intravenous contrast. Multiplanar CT image reconstructions and MIPs were obtained to evaluate the vascular anatomy. CONTRAST:  58mL OMNIPAQUE IOHEXOL 350 MG/ML SOLN COMPARISON:  None. FINDINGS: Cardiovascular: There is a optimal opacification of the pulmonary arteries. There is no central,segmental, or subsegmental filling defects within the pulmonary arteries. The heart is normal in size. No pericardial effusion or thickening. No evidence right heart strain. There is normal three-vessel brachiocephalic anatomy without proximal stenosis. The thoracic aorta is normal in appearance. Mediastinum/Nodes: No hilar, mediastinal, or axillary adenopathy. Thyroid gland, trachea, and esophagus demonstrate no  significant findings. Lungs/Pleura: Multifocal patchy ground-glass opacities are seen predominantly within the periphery of both lungs. No pleural effusion or pneumothorax is seen. Upper Abdomen: No acute abnormalities present in the visualized portions of the upper abdomen. Musculoskeletal: No chest wall abnormality. No acute or significant osseous findings. Review of the MIP images confirms the above findings. Abdomen/pelvis: Hepatobiliary: There is diffuse low density seen throughout the liver parenchyma. The main portal vein is patent. No evidence of calcified gallstones, gallbladder wall thickening or biliary dilatation. Pancreas: Unremarkable. No pancreatic ductal dilatation or surrounding inflammatory changes. Spleen: Normal in size without focal abnormality. Adrenals/Urinary Tract: Both adrenal glands appear normal. The kidneys and collecting system appear normal without evidence of urinary tract calculus or hydronephrosis. Bladder is unremarkable. Stomach/Bowel: The stomach, small bowel, and colon are normal in appearance. No inflammatory changes, wall thickening, or obstructive findings. There appears to be a surgical anastomosis at the sigmoid rectal junction. Mild presacral soft tissue density is again identified. Vascular/Lymphatic: There are no enlarged mesenteric, retroperitoneal, or pelvic lymph nodes. An infrarenal IVC filter is seen at approximately the L3 level. There is scattered aortic and bi-iliac calcifications noted. Reproductive: The uterus and adnexa are unremarkable. Other: No evidence of abdominal wall mass or hernia. Musculoskeletal: No acute or significant osseous findings. IMPRESSION: No central, segmental, or subsegmental pulmonary embolism. Multifocal patchy airspace opacities throughout both lungs, likely consistent with multifocal pneumonia. Hepatic steatosis Aortic Atherosclerosis (ICD10-I70.0). Electronically Signed   By: Prudencio Pair M.D.   On: 03/22/2020 00:07   MR THORACIC  SPINE W WO CONTRAST  Result Date: 03/22/2020 CLINICAL DATA:  Bilateral lower extremity pain and numbness with urinary retention. Diabetes. History of colon cancer. EXAM: MRI THORACIC WITHOUT AND WITH CONTRAST TECHNIQUE: Multiplanar and multiecho pulse sequences of the thoracic spine were obtained without and with intravenous contrast. CONTRAST:  9.60mL GADAVIST GADOBUTROL 1 MMOL/ML IV SOLN COMPARISON:  CT chest done yesterday. FINDINGS: MRI THORACIC SPINE FINDINGS Alignment:  Normal Vertebrae: Normal. No fracture or primary bone lesion. No metastatic disease. Insignificant small hemangioma within the T11 vertebral body. Cord: No primary cord pathology. See below regarding stenosis at T7-8. Paraspinal and other soft tissues: Negative Disc levels: No abnormality at T5-6 or above. T6-7: Small right paracentral disc protrusion indents the thecal sac slightly but does not cause apparent neural compression. T7-8: Left posterolateral disc herniation effaces the ventral subarachnoid space and indents the left side of the cord. This could be symptomatic. T8-9 through T12-L1: Normal. 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 Ample subarachnoid space is present dorsal to the cord at this level. None the less, this could possibly be symptomatic. Small right paracentral disc herniation at T6-7 without apparent neural compression. Electronically Signed   By: Nelson Chimes M.D.   On: 03/22/2020 12:32   MR Lumbar Spine W Wo Contrast  Result Date: 03/22/2020 CLINICAL DATA:  Back pain.  Urinary retention.  Diabetes. EXAM: MRI LUMBAR SPINE WITHOUT AND WITH CONTRAST TECHNIQUE: Multiplanar and multiecho pulse  sequences of the lumbar spine were obtained without and with intravenous contrast. CONTRAST:  9.51mL GADAVIST GADOBUTROL 1 MMOL/ML IV SOLN COMPARISON:  None. FINDINGS: Segmentation:  5 lumbar type vertebral bodies.  Alignment:  Slightly exaggerated lower lumbar lordosis. Vertebrae:  No fracture or primary bone lesion. Conus medullaris and cauda equina: Conus extends to the L1 level. Conus and cauda equina appear normal. Paraspinal and other soft tissues: There appears be thrombosis of the IVC and both iliac veins up to the level of the IVC filter. Flow is restored in the IVC by inflow from the renal veins. Disc levels: No abnormality from L1-2 through L2-3. L3-4: No disc abnormality.  Mild facet hypertrophy.  No stenosis. L4-5: No disc abnormality. Mild to moderate facet hypertrophy. No stenosis. L5-S1: Minimal disc bulge.  Mild facet hypertrophy.  No stenosis. IMPRESSION: No significant disc pathology. No stenosis or neural compression in the lumbar region. Patient has exaggerated lumbar lordosis. 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 Flow is restored in the IVC above that by inflow from the renal veins. I am in the process of calling this report. Electronically Signed   By: Nelson Chimes M.D.   On: 03/22/2020 12:46   CT ABDOMEN PELVIS W CONTRAST  Result Date: 03/22/2020 CLINICAL DATA:  Severe back pain hypotension and shallow EXAM: CT ANGIOGRAPHY CHEST WITH CONTRAST TECHNIQUE: Multidetector CT imaging of the chest was performed using the standard protocol during bolus administration of intravenous contrast. Multiplanar CT image reconstructions and MIPs were obtained to evaluate the vascular anatomy. CONTRAST:  40mL OMNIPAQUE IOHEXOL 350 MG/ML SOLN COMPARISON:  None. FINDINGS: Cardiovascular: There is a optimal opacification of the pulmonary arteries. There is no central,segmental, or subsegmental filling defects within the pulmonary arteries. The heart is normal in size. No pericardial effusion or thickening. No evidence right heart  strain. There is normal three-vessel brachiocephalic anatomy without proximal stenosis. The thoracic aorta is normal in appearance. Mediastinum/Nodes: No hilar, mediastinal, or axillary adenopathy. Thyroid gland, trachea, and esophagus demonstrate no significant findings. Lungs/Pleura: Multifocal patchy ground-glass opacities are seen predominantly within the periphery of both lungs. No pleural effusion or pneumothorax is seen. Upper Abdomen: No acute abnormalities present in the visualized portions of the upper abdomen. Musculoskeletal: No chest wall abnormality. No acute or significant osseous findings. Review of the MIP images confirms the above findings. Abdomen/pelvis: Hepatobiliary: There is diffuse low density seen throughout the liver parenchyma. The main portal vein is patent. No evidence of calcified gallstones, gallbladder wall thickening or biliary dilatation. Pancreas: Unremarkable. No pancreatic ductal dilatation or surrounding inflammatory changes. Spleen: Normal in size without focal abnormality. Adrenals/Urinary Tract: Both adrenal glands appear normal. The kidneys and collecting system appear normal without evidence of urinary tract calculus or hydronephrosis. Bladder is unremarkable. Stomach/Bowel: The stomach, small bowel, and colon are normal in appearance. No inflammatory changes, wall thickening, or obstructive findings. There appears to be a surgical anastomosis at the sigmoid rectal junction. Mild presacral soft tissue density is again identified. Vascular/Lymphatic: There are no enlarged mesenteric, retroperitoneal, or pelvic lymph nodes. An infrarenal IVC filter is seen at approximately the L3 level. There is scattered aortic and bi-iliac calcifications noted. Reproductive: The uterus and adnexa are unremarkable. Other: No evidence of abdominal wall mass or hernia. Musculoskeletal: No acute or significant osseous findings. IMPRESSION:  No central, segmental, or subsegmental pulmonary  embolism. Multifocal patchy airspace opacities throughout both lungs, likely consistent with multifocal pneumonia. Hepatic steatosis Aortic Atherosclerosis (ICD10-I70.0). Electronically Signed   By: Prudencio Pair M.D.   On: 03/22/2020 00:07   DG Chest Port 1 View  Result Date: 03/21/2020 CLINICAL DATA:  54 year old female with weakness and hypotension. EXAM: PORTABLE CHEST 1 VIEW COMPARISON:  Chest radiograph dated 03/25/2016 FINDINGS: Faint bilateral peripheral and subpleural densities, right greater left concerning for pneumonia, possibly atypical or viral in etiology including COVID-19. Clinical correlation is recommended. No focal consolidation, pleural effusion, pneumothorax. The cardiac silhouette is within limits. No acute osseous pathology. IMPRESSION: Findings concerning for pneumonia. Electronically Signed   By: Anner Crete M.D.   On: 03/21/2020 22:17   DG Abd 2 Views  Result Date: 04/06/2020 CLINICAL DATA:  Abdominal pain and nausea as well as constipation today. EXAM: ABDOMEN - 2 VIEW COMPARISON:  CT 03/21/2020 FINDINGS: Examination demonstrates several air-filled dilated centralized small bowel loops measuring up to 5.7 cm in diameter. No free peritoneal air. Mild air and stool within the colon. Several differential air-fluid levels are present on the decubitus film. Stent present over the mid to lower abdomen just right of midline. Remaining bones and soft tissues are unremarkable. IMPRESSION: Findings likely due to small-bowel obstruction and less likely ileus. Electronically Signed   By: Marin Olp M.D.   On: 04/06/2020 11:05   DG Abd Portable 1V  Result Date: 04/08/2020 CLINICAL DATA:  Initial evaluation for acute diarrhea. EXAM: PORTABLE ABDOMEN - 1 VIEW COMPARISON:  Prior radiograph from 04/07/2020 FINDINGS: Enteric tube in place with tip overlying the region of the pylorus, side hole well beyond the GE junction. There has been interval migration of contrast material since  previous exam, now seen within the ascending, transverse, and descending colon. Few nondilated gas-filled loops of small bowel seen within the left and mid abdomen, similar to previous. No appreciable abnormal bowel wall thickening. No free air on these limited supine views of the abdomen. IVC filter overlying the right abdomen again noted. IMPRESSION: 1. Interval migration of contrast material since previous exam, now seen entirely within the nondilated colon. Few persistent mildly prominent gas-filled loops of small bowel overlie the mid and left abdomen, similar to previous. 2. Enteric tube in place with tip overlying the region of the pylorus. Electronically Signed   By: Jeannine Boga M.D.   On: 04/08/2020 08:18   DG Abd Portable 1V-Small Bowel Obstruction Protocol-initial, 8 hr delay  Result Date: 04/07/2020 CLINICAL DATA:  Bowel obstruction EXAM: PORTABLE ABDOMEN - 1 VIEW COMPARISON:  04/06/2020, CT 04/06/2020 FINDINGS: Esophageal tube tip in the right upper quadrant. Dilute contrast within mildly prominent left abdominal small bowel loops with contrast present in the colon. IVC stent. IMPRESSION: Overall decreased small bowel dilatation compared to prior. Dilute contrast present within left abdominal small bowel loops as well as the colon. Electronically Signed   By: Donavan Foil M.D.   On: 04/07/2020 21:00   VAS [place] LOWER EXTREMITY VENOUS (DVT)  Result Date: 03/22/2020  Lower Venous DVTStudy Indications: Hx DVT and IVC filter, now with COVID and elevated D dimer.  Comparison Study: 06/07/15 negative Performing Technologist: June Leap RDMS, RVT  Examination Guidelines: A complete evaluation includes B-mode imaging, spectral Doppler, color Doppler, and power Doppler as needed of all accessible portions of each vessel. Bilateral testing is considered an integral part of a complete examination. Limited examinations for reoccurring indications may be performed as noted.  The reflux portion of the  exam is performed with the patient in reverse Trendelenburg.  +---------+---------------+---------+-----------+----------+-----------------+ RIGHT    CompressibilityPhasicitySpontaneityPropertiesThrombus Aging    +---------+---------------+---------+-----------+----------+-----------------+ CFV      None           No       No                   Acute             +---------+---------------+---------+-----------+----------+-----------------+ SFJ      None                                         Acute             +---------+---------------+---------+-----------+----------+-----------------+ FV Prox  None                                         Acute             +---------+---------------+---------+-----------+----------+-----------------+ FV Mid   None                                         Acute             +---------+---------------+---------+-----------+----------+-----------------+ FV DistalNone                                         Acute             +---------+---------------+---------+-----------+----------+-----------------+ PFV      None                                         Acute             +---------+---------------+---------+-----------+----------+-----------------+ POP      None           No       No                   Acute             +---------+---------------+---------+-----------+----------+-----------------+ PTV      None                                         Acute             +---------+---------------+---------+-----------+----------+-----------------+ PERO     None                                         Acute             +---------+---------------+---------+-----------+----------+-----------------+ Gastroc  None  Acute             +---------+---------------+---------+-----------+----------+-----------------+ GSV      None                                         Acute              +---------+---------------+---------+-----------+----------+-----------------+ Ex Iliac                No       No                   Age Indeterminate +---------+---------------+---------+-----------+----------+-----------------+ Com Iliac                                             Not visualized    +---------+---------------+---------+-----------+----------+-----------------+   +---------+---------------+---------+-----------+----------+-----------------+ LEFT     CompressibilityPhasicitySpontaneityPropertiesThrombus Aging    +---------+---------------+---------+-----------+----------+-----------------+ CFV      None           No       No                   Acute             +---------+---------------+---------+-----------+----------+-----------------+ SFJ      None                                         Acute             +---------+---------------+---------+-----------+----------+-----------------+ FV Prox  None                                         Acute             +---------+---------------+---------+-----------+----------+-----------------+ FV Mid   None                                         Acute             +---------+---------------+---------+-----------+----------+-----------------+ FV DistalNone                                         Acute             +---------+---------------+---------+-----------+----------+-----------------+ PFV      None                                         Acute             +---------+---------------+---------+-----------+----------+-----------------+ POP      None           No       No                   Acute             +---------+---------------+---------+-----------+----------+-----------------+ PTV  None                                         Acute             +---------+---------------+---------+-----------+----------+-----------------+ PERO     None                                          Acute             +---------+---------------+---------+-----------+----------+-----------------+ Gastroc  None                                         Acute             +---------+---------------+---------+-----------+----------+-----------------+ GSV      None                                         Acute             +---------+---------------+---------+-----------+----------+-----------------+ Ex Iliac                No       No                   Age Indeterminate +---------+---------------+---------+-----------+----------+-----------------+ Com Iliac                                             Not visualized    +---------+---------------+---------+-----------+----------+-----------------+     Summary: RIGHT: - Findings consistent with acute deep vein thrombosis involving the right common femoral vein, SF junction, right femoral vein, right proximal profunda vein, right popliteal vein, right posterior tibial veins, right peroneal veins, and right gastrocnemius veins. - Findings consistent with acute superficial vein thrombosis involving the right great saphenous vein. - Thrombosis extends into the Right Iliac vein and IVC up to the level of the filter placement.  LEFT: - Findings consistent with acute deep vein thrombosis involving the left common femoral vein, SF junction, left femoral vein, left proximal profunda vein, left popliteal vein, left posterior tibial veins, left peroneal veins, and left gastrocnemius veins. - Findings consistent with acute superficial vein thrombosis involving the left great saphenous vein. - Thrombosis extends into the Left Iliac vein.  *See table(s) above for measurements and observations. Electronically signed by Curt Jews MD on 03/22/2020 at 5:19:17 PM.    Final    HYBRID OR IMAGING ([place])  Result Date: 03/29/2020 There is no interpretation for this exam.  This order is for images obtained during a surgical procedure.  Please See  "Surgeries" Tab for more information regarding the procedure.   HYBRID OR IMAGING ([facility] ONLY)  Result Date: 03/28/2020 There is no interpretation for this exam.  This order is for images obtained during a surgical procedure.  Please See "Surgeries" Tab for more information regarding the procedure.

## 2020-04-09 NOTE — Progress Notes (Addendum)
°  Progress Note    04/09/2020 8:44 AM 11 Days Post-Op  Subjective:  Noticing some edema of BLE however has not yet put on her compression this morning.  No pain BLE.   Vitals:   04/09/20 0715 04/09/20 0747  BP:  138/83  Pulse: 70 74  Resp:  16  Temp:  98.4 F (36.9 C)  SpO2: 99% 96%   Physical Exam: Lungs:  Non labored Extremities:  Some pitting edema up to mid shin; feet are warm and well perfused Abdomen:  soft Neurologic: A&O  CBC    Component Value Date/Time   WBC 7.0 04/09/2020 0520   RBC 3.39 (L) 04/09/2020 0520   HGB 10.0 (L) 04/09/2020 0520   HGB 13.3 12/17/2013 1538   HCT 31.0 (L) 04/09/2020 0520   HCT 40.6 12/17/2013 1538   PLT 237 04/09/2020 0520   PLT 219 12/17/2013 1538   MCV 91.4 04/09/2020 0520   MCV 90.3 12/17/2013 1538   MCH 29.5 04/09/2020 0520   MCHC 32.3 04/09/2020 0520   RDW 15.2 04/09/2020 0520   RDW 13.5 12/17/2013 1538   LYMPHSABS 1.1 03/30/2020 0922   LYMPHSABS 1.4 12/17/2013 1538   MONOABS 0.6 03/30/2020 0922   MONOABS 0.3 12/17/2013 1538   EOSABS 0.0 03/30/2020 0922   EOSABS 0.0 12/17/2013 1538   BASOSABS 0.0 03/30/2020 0922   BASOSABS 0.0 12/17/2013 1538    BMET    Component Value Date/Time   NA 137 04/09/2020 0520   NA 144 01/21/2018 1000   NA 142 12/17/2013 1538   K 3.1 (L) 04/09/2020 0520   K 4.1 12/17/2013 1538   CL 100 04/09/2020 0520   CL 106 12/07/2012 0838   CO2 26 04/09/2020 0520   CO2 28 12/17/2013 1538   GLUCOSE 166 (H) 04/09/2020 0520   GLUCOSE 123 12/17/2013 1538   GLUCOSE 119 (H) 12/07/2012 0838   BUN <5 (L) 04/09/2020 0520   BUN 12 01/21/2018 1000   BUN 14.1 12/17/2013 1538   CREATININE 0.71 04/09/2020 0520   CREATININE 0.96 10/27/2015 0904   CREATININE 0.8 12/17/2013 1538   CALCIUM 8.3 (L) 04/09/2020 0520   CALCIUM 9.7 12/17/2013 1538   GFRNONAA >60 04/09/2020 0520   GFRAA >60 04/09/2020 0520    INR    Component Value Date/Time   INR 1.5 (H) 03/21/2020 2143   INR 2.10 12/07/2012 1025      Intake/Output Summary (Last 24 hours) at 04/09/2020 0844 Last data filed at 04/09/2020 0747 Gross per 24 hour  Intake 1660.36 ml  Output 2800 ml  Net -1139.64 ml     Assessment/Plan:  54 y.o. female is s/p AngioJet mechanical thrombectomy with filter removal with RP hematoma 11 Days Post-Op   BLE improving per patient; minimal edema BLE when wearing compression Ileus management per Surgery Transition to oral anticoagulation when tolerating p.o.   Dagoberto Ligas, PA-C [facility] [phone] 04/09/2020 8:44 AM  I have independently interviewed and examined patient and agree with PA assessment and plan above.  NG tube being removed today.  Appreciate general surgery management of this patient.  When she is tolerating p.o. can transition back to [place].   Tyquasia Pant C. Donzetta Matters, MD [facility] [place] Office: [phone] Pager: [phone]

## 2020-04-10 ENCOUNTER — Telehealth: Payer: Self-pay

## 2020-04-10 LAB — BASIC METABOLIC PANEL
Anion gap: 7 (ref 5–15)
BUN: <5 mg/dL — ABNORMAL LOW (ref 6–20)
CO2: 25 mmol/L (ref 22–32)
Calcium: 8.2 mg/dL — ABNORMAL LOW (ref 8.9–10.3)
Chloride: 107 mmol/L (ref 98–111)
Creatinine, Ser: 0.69 mg/dL (ref 0.44–1.00)
GFR calc Af Amer: >60 mL/min (ref >60)
GFR calc non Af Amer: >60 mL/min (ref >60)
Glucose, Bld: 155 mg/dL — ABNORMAL HIGH (ref 70–99)
Potassium: 3.5 mmol/L (ref 3.5–5.1)
Sodium: 139 mmol/L (ref 135–145)

## 2020-04-10 LAB — CBC
HCT: 31.5 % — ABNORMAL LOW (ref 36.0–46.0)
Hemoglobin: 10.3 g/dL — ABNORMAL LOW (ref 12.0–15.0)
MCH: 29.5 pg (ref 26.0–34.0)
MCHC: 32.7 g/dL (ref 30.0–36.0)
MCV: 90.3 fL (ref 80.0–100.0)
Platelets: 269 10*3/uL (ref 150–400)
RBC: 3.49 MIL/uL — ABNORMAL LOW (ref 3.87–5.11)
RDW: 15.9 % — ABNORMAL HIGH (ref 11.5–15.5)
WBC: 6.5 10*3/uL (ref 4.0–10.5)
nRBC: 0 % (ref 0.0–0.2)

## 2020-04-10 LAB — MAGNESIUM: Magnesium: 1.7 mg/dL (ref 1.7–2.4)

## 2020-04-10 LAB — GLUCOSE, CAPILLARY
Glucose-Capillary: 148 mg/dL — ABNORMAL HIGH (ref 70–99)
Glucose-Capillary: 122 mg/dL — ABNORMAL HIGH (ref 70–99)

## 2020-04-10 LAB — HEPARIN LEVEL (UNFRACTIONATED): Heparin Unfractionated: 0.55 IU/mL (ref 0.30–0.70)

## 2020-04-10 MED ORDER — NYSTATIN 100000 UNIT/GM EX POWD
Freq: Three times a day (TID) | CUTANEOUS | Status: DC
  Filled 2020-04-10: qty 15

## 2020-04-10 MED ORDER — POTASSIUM CHLORIDE CRYS ER 20 MEQ PO TBCR
40.0000 meq | EXTENDED_RELEASE_TABLET | Freq: Once | ORAL | Status: AC
  Administered 2020-04-10: 40 meq via ORAL
  Filled 2020-04-10: qty 2

## 2020-04-10 MED ORDER — FLUCONAZOLE 100 MG PO TABS
100.0000 mg | ORAL_TABLET | Freq: Once | ORAL | Status: AC
  Administered 2020-04-10: 100 mg via ORAL
  Filled 2020-04-10: qty 1

## 2020-04-10 MED ORDER — APIXABAN 5 MG PO TABS
5.0000 mg | ORAL_TABLET | Freq: Two times a day (BID) | ORAL | Status: DC
  Administered 2020-04-10: 5 mg via ORAL
  Filled 2020-04-10: qty 1

## 2020-04-10 MED ORDER — APIXABAN 5 MG PO TABS: 5.0000 mg | ORAL_TABLET | Freq: Two times a day (BID) | ORAL | 0 refills | Status: DC

## 2020-04-10 MED ORDER — MAGNESIUM SULFATE 2 GM/50ML IV SOLN
2.0000 g | Freq: Once | INTRAVENOUS | Status: AC
  Administered 2020-04-10: 2 g via INTRAVENOUS
  Filled 2020-04-10: qty 50

## 2020-04-10 MED FILL — ELIQUIS 5 MG TABLET: 5 | 30 days supply | Qty: 60 | Fill #0

## 2020-04-10 NOTE — Progress Notes (Signed)
ANTICOAGULATION CONSULT NOTE  Pharmacy Consult:  Heparin>> Eliquis Indication: clotted IVC and DVTs   Heparin dosing weight: 101 kg  Labs: Recent Labs    04/07/20 1045 04/08/20 0336 04/08/20 0336 04/08/20 1037 04/08/20 1847 04/09/20 0520 04/09/20 1219 04/10/20 0311  HGB  --  9.5*   < >  --   --  10.0*  --  10.3*  HCT  --  29.4*  --   --   --  31.0*  --  31.5*  PLT  --  219  --   --   --  237  --  269  APTT 66* 60*  --  56*  --   --   --   --   HEPARINUNFRC  --  0.10*   < > 0.10*   < > 0.43 0.56 0.55  CREATININE  --  0.75  --   --   --  0.71  --  0.69   < > = values in this interval not displayed.    Assessment: 5 yoF with PMH prior PE and DVT s/p IVC in 2012 on no anticoagulation at home admitted with iliac vein thrombus and occluded IVC filter. 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 Patient is s/p catheter-directed lysis and mechanical thrombectomy on 6/8 and 6/9 with no acute residual thrombus.  She was then transitioned to Xarelto, but now with SBO so pharmacy consulted to restart IV heparin.  Last Xarelto dose was on 04/05/20 around 1800.   Now for transition back to [place], will transition to Eliquis per MD instead of going back to Xarelto.    Goal of Therapy:  Monitor platelets per protocol: yes    Plan:  D/c heparin gtt @1000  with first Eliquis dose Transition to Eliquis 5mg  PO BID  Bertis Ruddy, PharmD Clinical Pharmacist Please check AMION for all [place] numbers 04/10/2020 7:18 AM

## 2020-04-10 NOTE — Progress Notes (Signed)
Inpatient Rehabilitation-Admissions Coordinator   Pt is doing much better from a functional ambulation standpoint today and PT has now recommended home with [facility] therapy. Met with pt bedside and she is in agreement for home with [facility]. MD and [facility] team aware. AC will sign off.   Raechel Ache, OTR/L  Rehab Admissions Coordinator  [phone] 04/10/2020 11:41 AM

## 2020-04-10 NOTE — Telephone Encounter (Signed)
Transition Care Management Follow-up Telephone Call  Date of discharge and from where:[place] 6/21  How have you been since you were released from the hospital? She Is feeling ok  Any questions or concerns? Not right now  Items Reviewed:  Did the pt receive and understand the discharge instructions provided? yes  Medications obtained and verified? yes  Any new allergies since your discharge? none  Dietary orders reviewed? Soft foods  Do you have support at home? Yes husband and son  Other (ie: DME, Home Health, etc) physical therapy and nursing  Functional Questionnaire: (I = Independent and D = Dependent) ADL's: D  Bathing/Dressing- I   Meal Prep- D  Eating- I  Maintaining continence- I  Transferring/Ambulation- I  Managing Meds- I  Follow up appointments reviewed:    PCP Hospital f/u appt confirmed?  Scheduled to see [name] DNP,FNP-BC on 6/30/214:00pm   Specialist Hospital f/u appt confirmed?  hasn't made the appointment just yet   Are transportation arrangements needed? no  If their condition worsens, is the pt aware to call  their PCP or go to the ED?yes  Was the patient provided with contact information for the PCP's office or ED?yes  Was the pt encouraged to call back with questions or concerns? Yes

## 2020-04-10 NOTE — Telephone Encounter (Signed)
Patient consented for virtual appointment. YL,RMA

## 2020-04-10 NOTE — TOC Transition Note (Signed)
Transition of Care [facility]) - CM/SW Discharge Note Marvetta Gibbons RN, BSN Transitions of Care Unit 4E- RN Case Manager [phone]   Patient Details  Name: [name] MRN: [id number] Date of Birth: 06/25/1966  Transition of Care [facility]) CM/SW Contact:  Dawayne Patricia, RN Phone Number: 04/10/2020, 12:51 PM   Clinical Narrative:    Pt stable today for transition home, pt no longer needs intensive inpt rehab and orders have been placed for [facility] services RN/PT- spoke with pt at bedside regarding [place] needs- pt feels like she may not really need HHRN - defiantly wants HHPT- msg sent to MD regarding [facility]- and verbal order received to drop the [facility]- and only have HHPT order for transition. DME order also placed for RW- pt voiced that she will have a RW available to her and does not need the DME ordered.  Address, phone # and PCP all confirmed with pt in epic.  Also discussed new Eliquis and copay cost- TOC pharmacy has already delivered 30 day free supply to the room. Pt provided copay card to use on next fill.   List provided to pt for [facility] agency choice- Per CMS guidelines from medicare.gov website with star ratings (copy placed in shadow chart)- pt has selected [facility], [facility], and [facility].  Call made to [facility] with Alvis Lemmings- able to accept referral with HHPT only- MD agreeable to this.  Pt called at home to let her know Alvis Lemmings would be calling within next 48 hr to set up start of care visit    Final next level of care: [place] Barriers to Discharge: Barriers Resolved   Patient Goals and CMS Choice Patient states their goals for this hospitalization and ongoing recovery are:: "to get back to normal" CMS Medicare.gov Compare Post Acute Care list provided to:: Patient Choice offered to / list presented to : Patient  Discharge Placement               Home with [facility]        Discharge Plan and Services   Discharge Planning Services: CM Consult, Medication Assistance Post  Acute Care Choice: [place], Durable Medical Equipment          DME Arranged: [facility] (declined- has one available)         HH Arranged: PT [place]: [place] Date [place]: 04/10/20 Time [place]: 1250 Representative spoke with at [place]: [place] ([place]) Interventions     Readmission Risk Interventions No flowsheet data found.

## 2020-04-10 NOTE — Progress Notes (Addendum)
Progress Note    04/10/2020 7:28 AM 12 Days Post-Op  Subjective:  Says she feels good this morning.  Tolerating diet and denies any nausea.  Afebrile  Vitals:   04/10/20 0041 04/10/20 0508  BP: 129/79 129/83  Pulse: 86 78  Resp: 17 17  Temp: 98.5 F (36.9 C) 98.9 F (37.2 C)  SpO2: 97% 98%    Physical Exam: General:  No distress; out of bed in chair Lungs:  Non labored Extremities:  BLE edema with some pitting; bilateral feet are warm and well perfused Abdomen:  Soft, NT/ND  CBC    Component Value Date/Time   WBC 6.5 04/10/2020 0311   RBC 3.49 (L) 04/10/2020 0311   HGB 10.3 (L) 04/10/2020 0311   HGB 13.3 12/17/2013 1538   HCT 31.5 (L) 04/10/2020 0311   HCT 40.6 12/17/2013 1538   PLT 269 04/10/2020 0311   PLT 219 12/17/2013 1538   MCV 90.3 04/10/2020 0311   MCV 90.3 12/17/2013 1538   MCH 29.5 04/10/2020 0311   MCHC 32.7 04/10/2020 0311   RDW 15.9 (H) 04/10/2020 0311   RDW 13.5 12/17/2013 1538   LYMPHSABS 1.1 03/30/2020 0922   LYMPHSABS 1.4 12/17/2013 1538   MONOABS 0.6 03/30/2020 0922   MONOABS 0.3 12/17/2013 1538   EOSABS 0.0 03/30/2020 0922   EOSABS 0.0 12/17/2013 1538   BASOSABS 0.0 03/30/2020 0922   BASOSABS 0.0 12/17/2013 1538    BMET    Component Value Date/Time   NA 139 04/10/2020 0311   NA 144 01/21/2018 1000   NA 142 12/17/2013 1538   K 3.5 04/10/2020 0311   K 4.1 12/17/2013 1538   CL 107 04/10/2020 0311   CL 106 12/07/2012 0838   CO2 25 04/10/2020 0311   CO2 28 12/17/2013 1538   GLUCOSE 155 (H) 04/10/2020 0311   GLUCOSE 123 12/17/2013 1538   GLUCOSE 119 (H) 12/07/2012 0838   BUN <5 (L) 04/10/2020 0311   BUN 12 01/21/2018 1000   BUN 14.1 12/17/2013 1538   CREATININE 0.69 04/10/2020 0311   CREATININE 0.96 10/27/2015 0904   CREATININE 0.8 12/17/2013 1538   CALCIUM 8.2 (L) 04/10/2020 0311   CALCIUM 9.7 12/17/2013 1538   GFRNONAA >60 04/10/2020 0311   GFRAA >60 04/10/2020 0311    INR    Component Value Date/Time   INR 1.5 (H)  03/21/2020 2143   INR 2.10 12/07/2012 1025     Intake/Output Summary (Last 24 hours) at 04/10/2020 9798 Last data filed at 04/10/2020 0600 Gross per 24 hour  Intake 1651.33 ml  Output 1300 ml  Net 351.33 ml     Assessment:  54 y.o. female is s/p:  AngioJet mechanical thrombectomy with filter removal with RP hematoma   12 Days Post-Op  Plan: -pt doing well this am-tolerated diet last evening.  She has not had any nausea and +flatus -transition back to [place] per primary team. -anticipate pt will dc soon.  Will have her f/u with Dr. Donzetta Matters in about 4 weeks with IVC duplex.      Leontine Locket, PA-C [facility] [phone] 04/10/2020 7:28 AM  I have independently interviewed and examined patient and agree with PA assessment and plan above.  Her abdomen is soft she is tolerating diet.  Minimal swelling in her bilateral lower extremities.  She will transition to oral anticoagulation.  She needs to wear her compression stockings when she is out of bed.  She is okay for discharge from vascular standpoint.  Erlene Quan  C. Donzetta Matters, MD [facility] [place] Office: [phone] Pager: [phone]

## 2020-04-10 NOTE — Consult Note (Addendum)
[facility] Nurse wound follow up Wound type: Medical device related pressure injuries x 4, two (2) to the clavicles, two (2) to the posterior knee/popliteal spaces. Patient with new complaint today of inguinal and labial edema and burning, mostly on left, also with a small amount of vaginal drainage and itching consistent with fungal overgrowth. Will add a topical antifungal and request consideration of a few doses of an oral antifungal for prompt resolution. Measurements: Right clavicle: Crusted with dried serum, measures 1.2cm x 0.5cm Left clavicle: 1.5cm round with yellow soft slough obscuring wound bed. Scant light yellow exudate consistent with autolytically debriding nonviable tisue Right popliteal space (DTPI in evolution): 2.2cm x 1cm with light yellow slough in wound bed. Light yellow exudate on dressing is consistent with autolytically debriding nonviable tissue. Left popliteal space (DTPI in evolution): 2.2cm x 1cm with light yellow slough in wound bed.  Light yellow exudate on dressing is consistent with autolytically debriding nonviable tissue. Left labia and inguinal area with erythema and mild edema, patient reports itching.Small amount of thin white vaginal discharge noted. Wound bed: As noted above Drainage (amount, consistency, odor) As noted above Periwound: intact with evidence of wound healing Dressing procedure/placement/frequency: Continue with current POC top include saline cleanse with placement of mupirocin ointment twice daily topped with silicone foam dressings with observation each shift and silicone topped dressing changes every 2-3 days.  Dr. Candiss Norse is messaged using Secure Chat to request consideration of a few does of oral antifungal for suspected vaginal yeast overgrowth.  [facility] nursing team will follow, seeing every 7-10 days, and will remain available to this patient, the nursing and medical teams.    Thank you, Maudie Flakes, MSN, RN, Chancy Milroy, Arther Abbott  Pager#  [phone]

## 2020-04-10 NOTE — Discharge Summary (Signed)
[name] CWU:[id number] DOB: 09-24-1966 DOA: 03/21/2020  PCP: [name] Chard, MD  Admit date: 03/21/2020  Discharge date: 04/10/2020  Admitted From: Home  Disposition:  Home   Recommendations for Outpatient Follow-up:   Follow up with PCP in 1-2 weeks  PCP Please obtain BMP/CBC, 2 view CXR in 1week,  (see Discharge instructions)   PCP Please follow up on the following pending results: Check CBC, CMP magnesium in 1 week, review CT scan report in detail.   Home Health: PT,RN   Equipment/Devices: Rolling walker  Consultations: None  Discharge Condition: Stable    CODE STATUS: Full    Diet Recommendation: Soft diet for 2 to 3 days thereafter advance to regular consistency heart healthy low carbohydrate diet as tolerated.  Monitor CBGs closely.     Chief Complaint  Patient presents with  . Hypotension  . Back Pain     Brief history of present illness from the day of admission and additional interim summary    Patient is a 54 y.o. female with PMHx of DM-2, HLD, hypothyroidism, PE-s/p IVC filter in 2012, history of colon cancer-diagnosed with COVID-19 on 5/21-presented with fatigue/malaise, low back pain-found to have patchy airspace disease on CTA chest, MRI thoracic/lumbar spine showed T7 disc herniation-but also showed extensive clot in the IVC.  Subsequently a Doppler showed extensive DVT in the bilateral lower extremity.  She was started on IV heparin-vascular surgery was consulted underwent catheter guided lytic therapy-subsequent mechanical thrombectomy/IVC filter removal-complicated by IVC tear requiring IVC stenting.  Postoperatively-developed acute blood loss anemia with hypovolemic shock secondary to blood loss-monitored in the ICU-briefly required pressors.  Once stabilized-she was transferred to a medical  unit-while awaiting CIR bed-she started developing vomiting-ultimately found to have ileus/partial small bowel obstruction.  See below for further details.  Significant Events: 6/1>> admit to [facility] for low back pain/weakness/malaise-found to have COVID-19 pneumonia on CT imaging. 6/2>> lower extremity Doppler/MRI LS spine-significant lower extremity DVT extending up to IVC filter 6/8>> catheter guided lytic therapy for bilateral lower extremity DVT 6/9>> mechanical thrombectomy/IVC filter removal 6/10>> hypotension overnight-requiring initiation of Levophed 6/11>> off Levophed since 6/10 evening-transfer out of ICU 6/17>> worsening of vomiting-found to have ileus/SBO-NG tube inserted.  Significant studies: 6/1>> CTA chest: No PE-multifocal patchy airspace opacities throughout both lungs. 6/1>> CT abdomen/pelvis: Hepatic steatosis, aortic atherosclerosis 6/2>> MRI thoracic spine: T7-8 central to left-sided disc herniation 6/2>> MRI lumbar spine: No significant disc pathology/no stenosis or neurocompression.  Thrombosis of the IVC, both iliac veins to the level of the IVC filter. 6/2>> lower extremity Doppler: Bilateral lower extremity extensive DVT. 6/17>> CT abdomen/pelvis: High-grade small bowel obstruction, right-sided retroperitoneal hematoma  Procedures: 6/8>> #1: Ultrasound-guided access, bilateral popliteal vein #2: Intravascular ultrasound, bilateral femoral, common femoral, external iliac, common iliac veins, and inferior vena cava #3: Placement of bilateral intravenous infusion catheter for thrombolytic therapy into the vena cava. #4: Initiation of thrombolytic therapy (TPA into the inferior vena cava, common iliac, external iliac, common femoral, and femoral veins  6/9>> 1.Ultrasound-guided cannulation of right  internal jugular vein 2.Ultrasound-guided cannulation of left internal jugular vein 3.Ultrasound-guided placement of 7 French  double-lumen central venous line and left internal jugular vein 4.Intravascular ultrasound of bilateral femoral, bilateral common femoral, bilateral external iliac and bilateral common iliac veins and IVC 5.IVC venogram 6.Angio VAC and cleaner wire mechanical thrombectomy of IVC, bilateral common iliac, bilateral external iliac, bilateral common femoral and bilateral femoral veins 7.Stent of IVC with 18 x 90 mm Wallstent 8.Ultrasound-guided cannulation right common femoral vein 9.Veno venous bypass                                                                   Hospital Course    Sepsis secondary to Covid 19 Viral pneumonia: Sepsis physiology has resolved-has completed a course of steroids/remdesivir.  Remains stable on room air.  This problem has resolved.   Extensive bilateral lower extremity DVTs-involving bilateral iliac veins and extending to IVC up to the level of IVC filter: s/p bilateral popliteal lytic catheter placement on 6/8-and mechanical thrombectomy/IVC filter on 6/9-vascular surgery following.  Was on argatroban due to thrombocytopenia-HIT antibody negative-platelet counts have improved initially kept on IV heparin due to SBO now transition to oral Eliquis.  Will have her follow-up with vascular surgery in 7 to 10 days post discharge.  Hypovolemic shock: Resolved-secondary to blood loss related to IVC tear during IVC filter removal-s/p stenting-resuscitated with IV fluids-briefly required Levophed while in the ICU.  BP now stable.  Acute blood loss anemia: Secondary to perioperative blood loss-see operative note-follow CBC-no indication for transfusion.  Hemoglobin slowly improving.  Repeat to recheck CBC in 7 to 10 days post discharge.  Thrombocytopenia: Continues to have stable thrombocytopenia-HIT antibody negative.  Platelet count slowly improving-follow closely.  Small bowel obstruction/ileus: CCS following.  NG in place but started having bowel movements  night of 04/07/2020, likely resolved, DC NG per CCS, clears PO.  AKI: Likely hemodynamically mediated-resolved  Hypokalemia and hypomagnesemia.  Replaced both.  T7-T8 disc herniation: Prior MD - [name] already discussed MRI findings with [name]-recommendations were for outpatient follow-up.  Hypothyroidism: Continue Synthroid.  TSH mildly elevated-not sure when her Synthroid dosing was adjusted last-stable for outpatient follow-up/adjustment with PCP.  HLD: Continue statin  Deconditioning/debility: Secondary to acute illness/blood loss anemia/numerous procedures-has nausea/pain/tachycardia with ambulation -evaluated by rehab services-recommendations are for CIR  DM-2 (A1c 8.6) with uncontrolled hyperglycemia with mild DKA: Resolved, resume home regimen, follow with PCP for outpatient monitoring and control.  Poor outpatient control due to hyperglycemia.  Lab Results  Component Value Date   HGBA1C 8.6 (H) 03/23/2020   CBG (last 3)  Recent [name]    04/09/20 1549 04/09/20 2218 04/10/20 0647  GLUCAP 147* 139* 148*      Discharge diagnosis     Principal Problem:   Sepsis due to COVID-19 [facility]) Active Problems:   Hyperlipidemia   Hypothyroidism   Type 2 diabetes mellitus without complication, without long-term current use of insulin [facility])    Discharge instructions    Discharge Instructions    Discharge instructions   Complete by: As directed    Follow with Primary MD [name] Chard, MD in 7 days   Get CBC, CMP, Magnesium  checked next visit within 1 week by Primary MD    Activity: As  tolerated with Full fall precautions use walker/cane & assistance as needed  Disposition Home   Diet: Soft diet for 2 to 3 days then advance gradually to regular consistency heart healthy Low Carb diet as tolerated.   Special Instructions: If you have smoked or chewed Tobacco  in the last 2 yrs please stop smoking, stop any regular Alcohol  and or any Recreational drug  use.  On your next visit with your primary care physician please Get Medicines reviewed and adjusted.  Please request your Prim.MD to go over all Hospital Tests and Procedure/Radiological results at the follow up, please get all Hospital records sent to your Prim MD by signing hospital release before you go home.  If you experience worsening of your admission symptoms, develop shortness of breath, life threatening emergency, suicidal or homicidal thoughts you must seek medical attention immediately by calling 911 or calling your MD immediately  if symptoms less severe.  You Must read complete instructions/literature along with all the possible adverse reactions/side effects for all the Medicines you take and that have been prescribed to you. Take any new Medicines after you have completely understood and accpet all the possible adverse reactions/side effects.   Discharge wound care:   Complete by: As directed    Keep open wounds clean and dry at all times, daily dry dressing to the clavicle site   Increase activity slowly   Complete by: As directed       Discharge Medications   Allergies as of 04/10/2020      Reactions   Metformin And Related Other (See Comments)   Chest pain   Penicillins Hives      Medication List    STOP taking these medications   aspirin 81 MG tablet   aspirin-acetaminophen-caffeine 250-250-65 MG tablet Commonly known as: EXCEDRIN MIGRAINE     TAKE these medications   apixaban 5 MG Tabs tablet Commonly known as: ELIQUIS Take 1 tablet (5 mg total) by mouth 2 (two) times daily.   benzonatate 100 MG capsule Commonly known as: TESSALON Take 1 capsule (100 mg total) by mouth every 8 (eight) hours.   cholecalciferol 10 MCG (400 UNIT) Tabs tablet Commonly known as: VITAMIN D3 Take 400 Units by mouth daily.   HUMALOG [place] Inject 10 Units into the skin 3 (three) times daily with meals.   Jardiance 25 MG Tabs tablet Generic drug: empagliflozin Take 25 mg  by mouth daily.   levothyroxine 100 MCG tablet Commonly known as: SYNTHROID Take 100 mcg by mouth daily before breakfast.   multivitamin tablet Take 1 tablet by mouth daily.   ondansetron 4 MG tablet Commonly known as: Zofran Take 1 tablet (4 mg total) by mouth every 8 (eight) hours as needed for nausea or vomiting.   pyridOXINE 100 MG tablet Commonly known as: VITAMIN B-6 Take 100 mg by mouth 2 (two) times daily.   simvastatin 20 MG tablet Commonly known as: ZOCOR Take 0.5 tablets (10 mg total) by mouth daily.   vitamin B-12 500 MCG tablet Commonly known as: CYANOCOBALAMIN Take 500 mcg by mouth daily.            Durable Medical Equipment  (From admission, onward)         Start     Ordered   04/10/20 0854  For home use only DME Walker rolling  Once       Comments: 5 wheel  Question Answer Comment  Walker: With 5 Inch Wheels   Patient needs a walker  to treat with the following condition Weakness      04/10/20 0853           Discharge Care Instructions  (From admission, onward)         Start     Ordered   04/10/20 0000  Discharge wound care:       Comments: Keep open wounds clean and dry at all times, daily dry dressing to the clavicle site   04/10/20 0858           Follow-up Information    [name] Chard, MD. Schedule an appointment as soon as possible for a visit in 1 week(s).   Specialty: Internal Medicine Contact information: [address] [place] [address] [phone]        Waynetta Sandy, MD. Schedule an appointment as soon as possible for a visit in 1 week(s).   Specialties: Vascular Surgery, Cardiology Why: IVC filter Contact information: [address] [name] [address] [phone]               Major procedures and Radiology Reports - PLEASE review detailed and final reports thoroughly  -        CT ABDOMEN PELVIS WO CONTRAST  Result Date: 04/06/2020 CLINICAL DATA:  Nausea and  vomiting, intermittent for several days, sepsis, abdominal distension, recent procedure for removal of IVC filter/thrombectomy with development of IVC perforation EXAM: CT ABDOMEN AND PELVIS WITHOUT CONTRAST TECHNIQUE: Multidetector CT imaging of the abdomen and pelvis was performed following the standard protocol without IV contrast. COMPARISON:  04/06/2020 FINDINGS: Lower chest: Trace left pleural effusion. Minimal dependent bilateral lower lobe atelectasis. Hepatobiliary: The dome of the liver is excluded by slice selection. Visualized portions of the unenhanced liver unremarkable. Gallbladder is decompressed, without focal abnormality. Pancreas: Unremarkable. No pancreatic ductal dilatation or surrounding inflammatory changes. Spleen: Normal in size without focal abnormality. Adrenals/Urinary Tract: No urinary tract calculi. There is mild distension of the renal pelves and ureters, likely due to significant bladder distension. The adrenals are unremarkable. Stomach/Bowel: There is evidence of high-grade small bowel obstruction. There is marked distension of the duodenum and jejunum. The distal jejunum and ileum are decompressed. Exact point of transition is not well visualized. Colon is decompressed. No bowel wall thickening Vascular/Lymphatic: Since the prior exam, the IVC filter has been removed and a stent placed within the IVC. There is evidence of retroperitoneal hematoma related to known IVC perforation intraoperatively. This measures up to 2.5 cm in thickness. Based on size, this is likely the sequela of the intraoperative perforation and there is no significant mass effect to suggest ongoing bleeding. Evaluation is limited without IV contrast. Minimal atherosclerosis of the aorta. No pathologic adenopathy. Reproductive: Uterus and bilateral adnexa are unremarkable. Other: There is no free intraperitoneal fluid or free gas. No abdominal wall hernia. Musculoskeletal: No acute or destructive bony lesions.  Reconstructed images demonstrate no additional findings. IMPRESSION: 1. High-grade small bowel obstruction. Exact point of transition not well visualized, likely in the distal jejunum or proximal ileum. 2. Right-sided retroperitoneal hematoma, likely due to the intraoperative IVC perforation during IVC filter removal. IVC stent placed in the interim. 3. Trace left pleural effusion. Electronically Signed   By: Randa Ngo M.D.   On: 04/06/2020 16:00   DG Abd 1 View  Result Date: 04/06/2020 CLINICAL DATA:  Nasogastric tube placement EXAM: ABDOMEN - 1 VIEW COMPARISON:  CT abdomen and pelvis April 06, 2020 FINDINGS: Nasogastric tube tip and side port in stomach. There are  multiple loops of mildly dilated bowel. No free air evident. Lung bases clear. Stent noted in inferior vena cava region. IMPRESSION: Nasogastric tube tip and side port in stomach. Loops of mildly dilated bowel, slightly less pronounced compared to earlier in the day. No free air evident. Lung bases clear. Electronically Signed   By: Lowella Grip III M.D.   On: 04/06/2020 17:59   CT Angio Chest PE W and/or Wo Contrast  Result Date: 03/22/2020 CLINICAL DATA:  Severe back pain hypotension and shallow EXAM: CT ANGIOGRAPHY CHEST WITH CONTRAST TECHNIQUE: Multidetector CT imaging of the chest was performed using the standard protocol during bolus administration of intravenous contrast. Multiplanar CT image reconstructions and MIPs were obtained to evaluate the vascular anatomy. CONTRAST:  29mL OMNIPAQUE IOHEXOL 350 MG/ML SOLN COMPARISON:  None. FINDINGS: Cardiovascular: There is a optimal opacification of the pulmonary arteries. There is no central,segmental, or subsegmental filling defects within the pulmonary arteries. The heart is normal in size. No pericardial effusion or thickening. No evidence right heart strain. There is normal three-vessel brachiocephalic anatomy without proximal stenosis. The thoracic aorta is normal in appearance.  Mediastinum/Nodes: No hilar, mediastinal, or axillary adenopathy. Thyroid gland, trachea, and esophagus demonstrate no significant findings. Lungs/Pleura: Multifocal patchy ground-glass opacities are seen predominantly within the periphery of both lungs. No pleural effusion or pneumothorax is seen. Upper Abdomen: No acute abnormalities present in the visualized portions of the upper abdomen. Musculoskeletal: No chest wall abnormality. No acute or significant osseous findings. Review of the MIP images confirms the above findings. Abdomen/pelvis: Hepatobiliary: There is diffuse low density seen throughout the liver parenchyma. The main portal vein is patent. No evidence of calcified gallstones, gallbladder wall thickening or biliary dilatation. Pancreas: Unremarkable. No pancreatic ductal dilatation or surrounding inflammatory changes. Spleen: Normal in size without focal abnormality. Adrenals/Urinary Tract: Both adrenal glands appear normal. The kidneys and collecting system appear normal without evidence of urinary tract calculus or hydronephrosis. Bladder is unremarkable. Stomach/Bowel: The stomach, small bowel, and colon are normal in appearance. No inflammatory changes, wall thickening, or obstructive findings. There appears to be a surgical anastomosis at the sigmoid rectal junction. Mild presacral soft tissue density is again identified. Vascular/Lymphatic: There are no enlarged mesenteric, retroperitoneal, or pelvic lymph nodes. An infrarenal IVC filter is seen at approximately the L3 level. There is scattered aortic and bi-iliac calcifications noted. Reproductive: The uterus and adnexa are unremarkable. Other: No evidence of abdominal wall mass or hernia. Musculoskeletal: No acute or significant osseous findings. IMPRESSION: No central, segmental, or subsegmental pulmonary embolism. Multifocal patchy airspace opacities throughout both lungs, likely consistent with multifocal pneumonia. Hepatic steatosis  Aortic Atherosclerosis (ICD10-I70.0). Electronically Signed   By: Prudencio Pair M.D.   On: 03/22/2020 00:07   MR THORACIC SPINE W WO CONTRAST  Result Date: 03/22/2020 CLINICAL DATA:  Bilateral lower extremity pain and numbness with urinary retention. Diabetes. History of colon cancer. EXAM: MRI THORACIC WITHOUT AND WITH CONTRAST TECHNIQUE: Multiplanar and multiecho pulse sequences of the thoracic spine were obtained without and with intravenous contrast. CONTRAST:  9.69mL GADAVIST GADOBUTROL 1 MMOL/ML IV SOLN COMPARISON:  CT chest done yesterday. FINDINGS: MRI THORACIC SPINE FINDINGS Alignment:  Normal Vertebrae: Normal. No fracture or primary bone lesion. No metastatic disease. Insignificant small hemangioma within the T11 vertebral body. Cord: No primary cord pathology. See below regarding stenosis at T7-8. Paraspinal and other soft tissues: Negative Disc levels: No abnormality at T5-6 or above. T6-7: Small right paracentral disc protrusion indents the thecal sac slightly but does  not cause apparent neural compression. T7-8: Left posterolateral disc herniation effaces the ventral subarachnoid space and indents the left side of the cord. This could be symptomatic. T8-9 through T12-L1: Normal. 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 Ample subarachnoid space is present dorsal to the cord at this level. None the less, this could possibly be symptomatic. Small right paracentral disc herniation at T6-7 without apparent neural compression. Electronically Signed   By: Nelson Chimes M.D.   On: 03/22/2020 12:32   MR Lumbar Spine W Wo Contrast  Result Date: 03/22/2020 CLINICAL DATA:  Back pain.  Urinary retention.  Diabetes. EXAM: MRI LUMBAR SPINE WITHOUT AND WITH CONTRAST TECHNIQUE: Multiplanar and multiecho pulse sequences of the lumbar spine were obtained without and with intravenous contrast. CONTRAST:   9.47mL GADAVIST GADOBUTROL 1 MMOL/ML IV SOLN COMPARISON:  None. FINDINGS: Segmentation:  5 lumbar type vertebral bodies. Alignment:  Slightly exaggerated lower lumbar lordosis. Vertebrae:  No fracture or primary bone lesion. Conus medullaris and cauda equina: Conus extends to the L1 level. Conus and cauda equina appear normal. Paraspinal and other soft tissues: There appears be thrombosis of the IVC and both iliac veins up to the level of the IVC filter. Flow is restored in the IVC by inflow from the renal veins. Disc levels: No abnormality from L1-2 through L2-3. L3-4: No disc abnormality.  Mild facet hypertrophy.  No stenosis. L4-5: No disc abnormality. Mild to moderate facet hypertrophy. No stenosis. L5-S1: Minimal disc bulge.  Mild facet hypertrophy.  No stenosis. IMPRESSION: No significant disc pathology. No stenosis or neural compression in the lumbar region. Patient has exaggerated lumbar lordosis. 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 Flow is restored in the IVC above that by inflow from the renal veins. I am in the process of calling this report. Electronically Signed   By: Nelson Chimes M.D.   On: 03/22/2020 12:46   CT ABDOMEN PELVIS W CONTRAST  Result Date: 03/22/2020 CLINICAL DATA:  Severe back pain hypotension and shallow EXAM: CT ANGIOGRAPHY CHEST WITH CONTRAST TECHNIQUE: Multidetector CT imaging of the chest was performed using the standard protocol during bolus administration of intravenous contrast. Multiplanar CT image reconstructions and MIPs were obtained to evaluate the vascular anatomy. CONTRAST:  39mL OMNIPAQUE IOHEXOL 350 MG/ML SOLN COMPARISON:  None. FINDINGS: Cardiovascular: There is a optimal opacification of the pulmonary arteries. There is no central,segmental, or subsegmental filling defects  within the pulmonary arteries. The heart is normal in size. No pericardial effusion or thickening. No evidence right heart strain. There is normal three-vessel brachiocephalic anatomy without proximal stenosis. The thoracic aorta is normal in appearance. Mediastinum/Nodes: No hilar, mediastinal, or axillary adenopathy. Thyroid gland, trachea, and esophagus demonstrate no significant findings. Lungs/Pleura: Multifocal patchy ground-glass opacities are seen predominantly within the periphery of both lungs. No pleural effusion or pneumothorax is seen. Upper Abdomen: No acute abnormalities present in the visualized portions of the upper abdomen. Musculoskeletal: No chest wall abnormality. No acute or significant osseous findings. Review of the MIP images confirms the above findings. Abdomen/pelvis: Hepatobiliary: There is diffuse low density seen throughout the liver parenchyma. The main portal vein is patent. No evidence of calcified gallstones, gallbladder wall thickening or biliary  dilatation. Pancreas: Unremarkable. No pancreatic ductal dilatation or surrounding inflammatory changes. Spleen: Normal in size without focal abnormality. Adrenals/Urinary Tract: Both adrenal glands appear normal. The kidneys and collecting system appear normal without evidence of urinary tract calculus or hydronephrosis. Bladder is unremarkable. Stomach/Bowel: The stomach, small bowel, and colon are normal in appearance. No inflammatory changes, wall thickening, or obstructive findings. There appears to be a surgical anastomosis at the sigmoid rectal junction. Mild presacral soft tissue density is again identified. Vascular/Lymphatic: There are no enlarged mesenteric, retroperitoneal, or pelvic lymph nodes. An infrarenal IVC filter is seen at approximately the L3 level. There is scattered aortic and bi-iliac calcifications noted. Reproductive: The uterus and adnexa are unremarkable. Other: No evidence of abdominal wall mass or hernia.  Musculoskeletal: No acute or significant osseous findings. IMPRESSION: No central, segmental, or subsegmental pulmonary embolism. Multifocal patchy airspace opacities throughout both lungs, likely consistent with multifocal pneumonia. Hepatic steatosis Aortic Atherosclerosis (ICD10-I70.0). Electronically Signed   By: Prudencio Pair M.D.   On: 03/22/2020 00:07   DG Chest Port 1 View  Result Date: 03/21/2020 CLINICAL DATA:  54 year old female with weakness and hypotension. EXAM: PORTABLE CHEST 1 VIEW COMPARISON:  Chest radiograph dated 03/25/2016 FINDINGS: Faint bilateral peripheral and subpleural densities, right greater left concerning for pneumonia, possibly atypical or viral in etiology including COVID-19. Clinical correlation is recommended. No focal consolidation, pleural effusion, pneumothorax. The cardiac silhouette is within limits. No acute osseous pathology. IMPRESSION: Findings concerning for pneumonia. Electronically Signed   By: Anner Crete M.D.   On: 03/21/2020 22:17   DG Abd 2 Views  Result Date: 04/06/2020 CLINICAL DATA:  Abdominal pain and nausea as well as constipation today. EXAM: ABDOMEN - 2 VIEW COMPARISON:  CT 03/21/2020 FINDINGS: Examination demonstrates several air-filled dilated centralized small bowel loops measuring up to 5.7 cm in diameter. No free peritoneal air. Mild air and stool within the colon. Several differential air-fluid levels are present on the decubitus film. Stent present over the mid to lower abdomen just right of midline. Remaining bones and soft tissues are unremarkable. IMPRESSION: Findings likely due to small-bowel obstruction and less likely ileus. Electronically Signed   By: Marin Olp M.D.   On: 04/06/2020 11:05   DG Abd Portable 1V  Result Date: 04/08/2020 CLINICAL DATA:  Initial evaluation for acute diarrhea. EXAM: PORTABLE ABDOMEN - 1 VIEW COMPARISON:  Prior radiograph from 04/07/2020 FINDINGS: Enteric tube in place with tip overlying the region of  the pylorus, side hole well beyond the GE junction. There has been interval migration of contrast material since previous exam, now seen within the ascending, transverse, and descending colon. Few nondilated gas-filled loops of small bowel seen within the left and mid abdomen, similar to previous. No appreciable abnormal bowel wall thickening. No free air on these limited supine views of the abdomen. IVC filter overlying the right abdomen again noted. IMPRESSION: 1. Interval migration of contrast material since previous exam, now seen entirely within the nondilated colon. Few persistent mildly prominent gas-filled loops of small bowel overlie the mid and left abdomen, similar to previous. 2. Enteric tube in place with tip overlying the region of the pylorus. Electronically Signed   By: Jeannine Boga M.D.   On: 04/08/2020 08:18   DG Abd Portable 1V-Small Bowel Obstruction Protocol-initial, 8 hr delay  Result Date: 04/07/2020 CLINICAL DATA:  Bowel obstruction EXAM: PORTABLE ABDOMEN - 1 VIEW COMPARISON:  04/06/2020, CT 04/06/2020 FINDINGS: Esophageal tube tip in the right upper quadrant. Dilute contrast within mildly prominent  left abdominal small bowel loops with contrast present in the colon. IVC stent. IMPRESSION: Overall decreased small bowel dilatation compared to prior. Dilute contrast present within left abdominal small bowel loops as well as the colon. Electronically Signed   By: Donavan Foil M.D.   On: 04/07/2020 21:00   VAS [place] LOWER EXTREMITY VENOUS (DVT)  Result Date: 03/22/2020  Lower Venous DVTStudy Indications: Hx DVT and IVC filter, now with COVID and elevated D dimer.  Comparison Study: 06/07/15 negative Performing Technologist: June Leap RDMS, RVT  Examination Guidelines: A complete evaluation includes B-mode imaging, spectral Doppler, color Doppler, and power Doppler as needed of all accessible portions of each vessel. Bilateral testing is considered an integral part of a complete  examination. Limited examinations for reoccurring indications may be performed as noted. The reflux portion of the exam is performed with the patient in reverse Trendelenburg.  +---------+---------------+---------+-----------+----------+-----------------+ RIGHT    CompressibilityPhasicitySpontaneityPropertiesThrombus Aging    +---------+---------------+---------+-----------+----------+-----------------+ CFV      None           No       No                   Acute             +---------+---------------+---------+-----------+----------+-----------------+ SFJ      None                                         Acute             +---------+---------------+---------+-----------+----------+-----------------+ FV Prox  None                                         Acute             +---------+---------------+---------+-----------+----------+-----------------+ FV Mid   None                                         Acute             +---------+---------------+---------+-----------+----------+-----------------+ FV DistalNone                                         Acute             +---------+---------------+---------+-----------+----------+-----------------+ PFV      None                                         Acute             +---------+---------------+---------+-----------+----------+-----------------+ POP      None           No       No                   Acute             +---------+---------------+---------+-----------+----------+-----------------+ PTV      None  Acute             +---------+---------------+---------+-----------+----------+-----------------+ PERO     None                                         Acute             +---------+---------------+---------+-----------+----------+-----------------+ Gastroc  None                                         Acute              +---------+---------------+---------+-----------+----------+-----------------+ GSV      None                                         Acute             +---------+---------------+---------+-----------+----------+-----------------+ Ex Iliac                No       No                   Age Indeterminate +---------+---------------+---------+-----------+----------+-----------------+ Com Iliac                                             Not visualized    +---------+---------------+---------+-----------+----------+-----------------+   +---------+---------------+---------+-----------+----------+-----------------+ LEFT     CompressibilityPhasicitySpontaneityPropertiesThrombus Aging    +---------+---------------+---------+-----------+----------+-----------------+ CFV      None           No       No                   Acute             +---------+---------------+---------+-----------+----------+-----------------+ SFJ      None                                         Acute             +---------+---------------+---------+-----------+----------+-----------------+ FV Prox  None                                         Acute             +---------+---------------+---------+-----------+----------+-----------------+ FV Mid   None                                         Acute             +---------+---------------+---------+-----------+----------+-----------------+ FV DistalNone                                         Acute             +---------+---------------+---------+-----------+----------+-----------------+ PFV  None                                         Acute             +---------+---------------+---------+-----------+----------+-----------------+ POP      None           No       No                   Acute             +---------+---------------+---------+-----------+----------+-----------------+ PTV      None                                          Acute             +---------+---------------+---------+-----------+----------+-----------------+ PERO     None                                         Acute             +---------+---------------+---------+-----------+----------+-----------------+ Gastroc  None                                         Acute             +---------+---------------+---------+-----------+----------+-----------------+ GSV      None                                         Acute             +---------+---------------+---------+-----------+----------+-----------------+ Ex Iliac                No       No                   Age Indeterminate +---------+---------------+---------+-----------+----------+-----------------+ Com Iliac                                             Not visualized    +---------+---------------+---------+-----------+----------+-----------------+     Summary: RIGHT: - Findings consistent with acute deep vein thrombosis involving the right common femoral vein, SF junction, right femoral vein, right proximal profunda vein, right popliteal vein, right posterior tibial veins, right peroneal veins, and right gastrocnemius veins. - Findings consistent with acute superficial vein thrombosis involving the right great saphenous vein. - Thrombosis extends into the Right Iliac vein and IVC up to the level of the filter placement.  LEFT: - Findings consistent with acute deep vein thrombosis involving the left common femoral vein, SF junction, left femoral vein, left proximal profunda vein, left popliteal vein, left posterior tibial veins, left peroneal veins, and left gastrocnemius veins. - Findings consistent with acute superficial vein thrombosis involving the left great saphenous vein. - Thrombosis extends into the Left Iliac vein.  *See table(s) above for measurements and observations. Electronically signed by Curt Jews MD  on 03/22/2020 at 5:19:17 PM.    Final    HYBRID OR IMAGING ([place])  Result Date: 03/29/2020 There is no interpretation for this exam.  This order is for images obtained during a surgical procedure.  Please See "Surgeries" Tab for more information regarding the procedure.   HYBRID OR IMAGING ([facility] ONLY)  Result Date: 03/28/2020 There is no interpretation for this exam.  This order is for images obtained during a surgical procedure.  Please See "Surgeries" Tab for more information regarding the procedure.    Micro Results     No results found for this or any previous visit (from the past 240 hour(s)).  Today   Subjective    [name] today has no headache,no chest abdominal pain,no new weakness tingling or numbness, feels much better wants to go home today.     Objective   Blood pressure 115/70, pulse 78, temperature 97.9 F (36.6 C), temperature source Oral, resp. rate 18, height 5\' 11"  (1.803 m), weight 91.9 kg, last menstrual period 09/12/2011, SpO2 100 %.   Intake/Output Summary (Last 24 hours) at 04/10/2020 0900 Last data filed at 04/10/2020 0600 Gross per 24 hour  Intake 1651.33 ml  Output 700 ml  Net 951.33 ml    Exam  Awake Alert, No new F.N deficits, Normal affect [place].AT,PERRAL Supple Neck,No JVD, No cervical lymphadenopathy appriciated.  Symmetrical Chest wall movement, Good air movement bilaterally, CTAB RRR,No Gallops,Rubs or new Murmurs, No Parasternal Heave +ve B.Sounds, Abd Soft, Non tender, No organomegaly appriciated, No rebound -guarding or rigidity. No Cyanosis, Clubbing or edema, No new Rash or bruise   Data Review   CBC w Diff:  Lab Results  Component Value Date   WBC 6.5 04/10/2020   HGB 10.3 (L) 04/10/2020   HGB 13.3 12/17/2013   HCT 31.5 (L) 04/10/2020   HCT 40.6 12/17/2013   PLT 269 04/10/2020   PLT 219 12/17/2013   LYMPHOPCT 11 03/30/2020   LYMPHOPCT 41.8 12/17/2013   MONOPCT 6 03/30/2020   MONOPCT 7.6 12/17/2013   EOSPCT 0 03/30/2020   EOSPCT 1.3 12/17/2013   BASOPCT 0 03/30/2020   BASOPCT  0.6 12/17/2013    CMP:  Lab Results  Component Value Date   NA 139 04/10/2020   NA 144 01/21/2018   NA 142 12/17/2013   K 3.5 04/10/2020   K 4.1 12/17/2013   CL 107 04/10/2020   CL 106 12/07/2012   CO2 25 04/10/2020   CO2 28 12/17/2013   BUN <5 (L) 04/10/2020   BUN 12 01/21/2018   BUN 14.1 12/17/2013   CREATININE 0.69 04/10/2020   CREATININE 0.96 10/27/2015   CREATININE 0.8 12/17/2013   GLU 125 01/21/2018   PROT 6.3 (L) 04/06/2020   PROT 7.7 12/17/2013   ALBUMIN 2.8 (L) 04/06/2020   ALBUMIN 4.0 12/17/2013   BILITOT 2.5 (H) 04/06/2020   BILITOT 0.30 12/17/2013   ALKPHOS 76 04/06/2020   ALKPHOS 147 12/17/2013   AST 23 04/06/2020   AST 15 12/17/2013   ALT 25 04/06/2020   ALT 19 12/17/2013  .   Total Time in preparing paper work, data evaluation and todays exam - 22 minutes  Lala Lund M.D on 04/10/2020 at 9:00 AM  [facility]   Office  [phone]

## 2020-04-10 NOTE — Progress Notes (Signed)
Pt received education and discharge instructions. Tele monitor removed/ccmd notified. Vitals stable. Both IV's removed and intact. Pt denies complaints.Pt has all belongings. Pt states she does not need rolling walker b/c she has one at home. Husband bringing clothes and pt will be tx via wheelchair by volunteers to [place].  Jerald Kief, RN

## 2020-04-10 NOTE — Care Management Important Message (Signed)
Important Message  Patient Details  Name: [name] MRN: [id number] Date of Birth: 1965/11/12   Medicare Important Message Given:  Yes   Patient left prior to IM delivery.  IM mailed to patient home address.  Kaden Daughdrill 04/10/2020, 12:08 PM

## 2020-04-10 NOTE — Progress Notes (Signed)
Physical Therapy Treatment Patient Details Name: [name] MRN: [id number] DOB: 06-29-1966 Today's Date: 04/10/2020    History of Present Illness Patient is a 54 y.o. female with PMHx of DM-2, HLD, hypothyroidism, PE-s/p IVC filter in 2012, history of colon cancer-diagnosed with COVID-19 on 5/21-presented with fatigue/malaise, low back pain-found to have patchy airspace disease on CTA chest, MRI thoracic/lumbar spine showed T7 disc herniation-but also showed extensive clot in the IVC.  s/p bilateral popliteal lytic catheter placement on 6/8-and mechanical thrombectomy/IVC filter on 6/9    PT Comments    Pt with much improved mobility this session, demonstrating great hallway distance ambulation with use of single UE support for pt comfort during gait. Pt proficiently navigated steps, demonstrating ability to d/c home. PT encouraged pt to have supervision of family with mobility upon d/c, and use RW as needed during gait. Pt is agreeable, PT updated d/c plan to reflect home with HHPT as pt has progressed beyond CIR level. PT to continue to follow acutely.   Follow Up Recommendations  Home health PT;Supervision for mobility/OOB     Equipment Recommendations  Other (comment)    Recommendations for Other Services       Precautions / Restrictions Precautions Precautions: Fall Restrictions Weight Bearing Restrictions: No    Mobility  Bed Mobility               General bed mobility comments: up in chair upon PT arrival.  Transfers Overall transfer level: Needs assistance Equipment used: None Transfers: Sit to/from Stand Sit to Stand: Modified independent (Device/Increase time)         General transfer comment: Mod I for increased time, no evidence of LOB/unsteadiness.  Ambulation/Gait Ambulation/Gait assistance: Supervision Gait Distance (Feet): 300 Feet Assistive device: None;IV Pole Gait Pattern/deviations: Step-through pattern;Decreased stride length;Trunk  flexed Gait velocity: decr   General Gait Details: supervision for safety, slow and steady gait with use of IV pole to steady self. Periods of gait without UE support, with no LOB but pt feels uncomfortable without UE support at this time. HRmax during gait 125 bpm   Stairs Stairs: Yes Stairs assistance: Supervision Stair Management: One rail Left;Step to pattern;Forwards Number of Stairs: 2 General stair comments: supervision for safety, verbal cuing for sequencing with step-to gait and caregiver guarding for safety during stair navigation.   Wheelchair Mobility    Modified Rankin (Stroke Patients Only)       Balance Overall balance assessment: Needs assistance Sitting-balance support: Feet supported Sitting balance-Leahy Scale: Good     Standing balance support: Bilateral upper extremity supported;During functional activity Standing balance-Leahy Scale: Fair                              Cognition Arousal/Alertness: Awake/alert Behavior During Therapy: WFL for tasks assessed/performed Overall Cognitive Status: Within Functional Limits for tasks assessed                                        Exercises      General Comments        Pertinent Vitals/Pain Pain Assessment: Faces Faces Pain Scale: Hurts a little bit Pain Location: bilateral thighs Pain Descriptors / Indicators: Sore;Discomfort Pain Intervention(s): Limited activity within patient's tolerance;Monitored during session;Repositioned    Home Living  Prior Function            PT Goals (current goals can now be found in the care plan section) Acute Rehab PT Goals Patient Stated Goal: feel better and leave for home PT Goal Formulation: With patient Time For Goal Achievement: 04/15/20 Potential to Achieve Goals: Good Progress towards PT goals: Progressing toward goals    Frequency    Min 3X/week      PT Plan Discharge plan needs to  be updated    Co-evaluation              AM-PAC PT "6 Clicks" Mobility   Outcome Measure  Help needed turning from your back to your side while in a flat bed without using bedrails?: None Help needed moving from lying on your back to sitting on the side of a flat bed without using bedrails?: None Help needed moving to and from a bed to a chair (including a wheelchair)?: None Help needed standing up from a chair using your arms (e.g., wheelchair or bedside chair)?: None Help needed to walk in hospital room?: A Little Help needed climbing 3-5 steps with a railing? : A Little 6 Click Score: 22    End of Session Equipment Utilized During Treatment: Gait belt Activity Tolerance: Patient tolerated treatment well Patient left: with call bell/phone within reach;in chair Nurse Communication: Mobility status PT Visit Diagnosis: Muscle weakness (generalized) (M62.81);Difficulty in walking, not elsewhere classified (R26.2)     Time: 7681-1572 PT Time Calculation (min) (ACUTE ONLY): 12 min  Charges:  $Gait Training: 8-22 mins                     Tymere Depuy E, PT [place] Pager [phone]  Office [phone]   Exavior Kimmons D Elonda Husky 04/10/2020, 11:25 AM

## 2020-04-19 ENCOUNTER — Other Ambulatory Visit: Payer: Self-pay

## 2020-04-19 ENCOUNTER — Encounter: Payer: Self-pay | Admitting: Nurse Practitioner

## 2020-04-19 ENCOUNTER — Telehealth (INDEPENDENT_AMBULATORY_CARE_PROVIDER_SITE_OTHER): Payer: No Typology Code available for payment source | Admitting: Nurse Practitioner

## 2020-04-19 VITALS — BP 129/82 | HR 83

## 2020-04-19 DIAGNOSIS — U071 COVID-19: Secondary | ICD-10-CM

## 2020-04-19 DIAGNOSIS — A4189 Other specified sepsis: Secondary | ICD-10-CM

## 2020-04-19 DIAGNOSIS — I82403 Acute embolism and thrombosis of unspecified deep veins of lower extremity, bilateral: Secondary | ICD-10-CM | POA: Diagnosis not present

## 2020-04-19 DIAGNOSIS — E782 Mixed hyperlipidemia: Secondary | ICD-10-CM

## 2020-04-19 MED ORDER — APIXABAN 5 MG PO TABS: 5.0000 mg | ORAL_TABLET | Freq: Two times a day (BID) | ORAL | 3 refills | Status: DC

## 2020-04-19 MED ORDER — SIMVASTATIN 20 MG PO TABS: 10.0000 mg | ORAL_TABLET | Freq: Every day | ORAL | 1 refills | Status: DC

## 2020-04-19 NOTE — Progress Notes (Signed)
Virtual Visit via Mychart video   This visit type was conducted due to national recommendations for restrictions regarding the COVID-19 Pandemic (e.g. social distancing) in an effort to limit this patient's exposure and mitigate transmission in our community.  Due to her co-morbid illnesses, this patient is at least at moderate risk for complications without adequate follow up.  This format is felt to be most appropriate for this patient at this time.  All issues noted in this document were discussed and addressed.  A limited physical exam was performed with this format.    This visit type was conducted due to national recommendations for restrictions regarding the COVID-19 Pandemic (e.g. social distancing) in an effort to limit this patient's exposure and mitigate transmission in our community.  Patients identity confirmed using two different identifiers.  This format is felt to be most appropriate for this patient at this time.  All issues noted in this document were discussed and addressed.  No physical exam was performed (except for noted visual exam findings with Video Visits).    Date:  04/19/2020   ID:  [name], DOB 1966-09-13, MRN [id number]  Patient Location:  Home - spoke with [name]  Provider location:   Office    Chief Complaint:  Hospital follow up pulmonary embolism  History of Present Illness:    [name] is a 54 y.o. female who presents via video conferencing for a telehealth visit today.    The patient does not have symptoms concerning for COVID-19 infection (fever, chills, cough, or new shortness of breath).   Video visit made after hospital admission on 03/21/2020 -04/10/2020. She had multiple blood clots throughout her iliac, her IVC was blocked with clots. She had to have a procedure to dissolve the clots, was on heparin and had an angiovac. They also removed the IVC filter, was damaged - cut a veing and began bleeding internally. She continued to have  abdominal pain - had to take oxycodone and dilaudid. She vomited and was found to have a bowel obstruction. Had to have an NG tube placed for small bowel obstruction, which has resolved. She also had a DVT . Moved her to inpatient PT, she was able to take steps with her walker and take a few steps. [facility].   She was treated with steroids and remdisivir for COVID19  Continues having fatigue, only able to do a little before having to take a break. 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 She is to file for short term disability.      Past Medical History:  Diagnosis Date  . 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  ECC with HPV effect  . 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 Diabetes mellitus without complication ([place])   . Headache(784.0)    experiences migraine every couple of weeks. takes only excedrin  . 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  colposcopy adequate normal 10/2015   . Hyperlipidemia   . Pulmonary embolism ([place])   . STD (sexually transmitted disease) 1991   POS GC-TREATED  . Thyroid disease    Past Surgical History:  Procedure Laterality Date  . AORTA - BILATERAL FEMORAL ARTERY BYPASS GRAFT N/A 03/29/2020   Procedure: VENO VENOUS BYPASS;  Surgeon: Waynetta Sandy, MD;  Location: [place];  Service: Vascular;  Laterality: N/A;  . APPLICATION OF [place] N/A 03/29/2020   Procedure: [facility] MECHANICAL THROMBECTOMY;  Surgeon: Waynetta Sandy, MD;  Location: [place];  Service: Vascular;  Laterality: N/A;  . CESAREAN SECTION  2005  . COLON SURGERY    . ENDOVASCULAR STENT INSERTION N/A 03/29/2020   Procedure: Inferior Vena Cava WallStent Graft Insertion;  Surgeon: Waynetta Sandy, MD;  Location: [place];  Service:  Vascular;  Laterality: N/A;  . INSERTION OF VENA CAVA FILTER Right 2012  . none    . PORT-A-CATH REMOVAL Right 12/22/2012   Procedure: REMOVAL PORT-A-CATH;  Surgeon: Odis Hollingshead, MD;  Location: [place];  Service: General;  Laterality: Right;  . PORTACATH PLACEMENT  02/21/11   tip in mid [id number]  . 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 THROMBECTOMY ILIAC ARTERY N/A 03/29/2020   Procedure: THROMBECTOMY ILIAC ARTERY MECHANICAL THROMBECTOMY;  Surgeon: Waynetta Sandy, MD;  Location: [facility] OR;  Service: Vascular;  Laterality: N/A;     Current Meds  Medication Sig  . apixaban (ELIQUIS) 5 MG TABS tablet Take 1 tablet (5 mg total) by mouth 2 (two) times daily.  . cholecalciferol (VITAMIN D) 400 UNITS TABS tablet Take 400 Units by mouth daily.   . cyanocobalamin 500 MCG tablet Take 500 mcg by mouth daily.  . Insulin Lispro (HUMALOG [place]) Inject 10 Units into the skin 3 (three) times daily with meals.   Marland Kitchen JARDIANCE 25 MG TABS tablet Take 25 mg by mouth daily.  Marland Kitchen levothyroxine (SYNTHROID, LEVOTHROID) 100 MCG tablet Take 100 mcg by mouth daily before breakfast.   . Multiple Vitamin (MULTIVITAMIN) tablet Take 1 tablet by mouth daily.  . [DISCONTINUED] apixaban (ELIQUIS) 5 MG TABS tablet Take 1 tablet (5 mg total) by mouth 2 (two) times daily.  . [DISCONTINUED] POTASSIUM CHLORIDE PO Take 20 mEq by mouth daily.      Allergies:   Metformin and related and Penicillins   Social History   Tobacco Use  . Smoking status: Never Smoker  . Smokeless tobacco: Never Used  Substance Use Topics  . Alcohol use: Yes    Comment: rare- twice a year  . Drug use: No     Family Hx: The patient's family history includes Brain cancer in her mother; Cervical cancer in her paternal grandmother; Diabetes in her brother and mother; Heart disease in her father; Hypertension in her mother; Kidney disease in her brother; Lung cancer in her mother. There is no history of Breast  cancer.  ROS:   Please see the history of present illness.    Review of Systems  Constitutional: Positive for malaise/fatigue.  Respiratory: Positive for cough (dry cough at times). Negative for wheezing.   Cardiovascular: Negative.  Negative for leg swelling.  Neurological: Negative for dizziness.  Psychiatric/Behavioral: Negative.     All other systems reviewed and are negative.   Labs/Other Tests and Data Reviewed:    Recent Labs: 03/21/2020: B Natriuretic Peptide 29.9 03/22/2020: TSH 10.445 04/06/2020: ALT 25 04/10/2020: BUN <5; Creatinine, Ser 0.69; Hemoglobin 10.3; Magnesium 1.7; Platelets 269; Potassium 3.5; Sodium 139   Recent Lipid Panel Lab Results  Component Value Date/Time   CHOL 244 (H) 12/03/2018 11:28 AM   TRIG 130 12/03/2018 11:28 AM   HDL 52 12/03/2018 11:28 AM   CHOLHDL 4.7 (H) 12/03/2018 11:28 AM   CHOLHDL 3.9 10/27/2015 09:04 AM   LDLCALC 166 (H) 12/03/2018  11:28 AM    Wt Readings from Last 3 Encounters:  04/10/20 202 lb 8 oz (91.9 kg)  02/15/20 201 lb (91.2 kg)  08/02/19 210 lb (95.3 kg)     Exam:    Vital Signs:  BP 129/82 (BP Location: Left Arm, Patient Position: Sitting, Cuff Size: Small)   Pulse 83   LMP 09/12/2011     Physical Exam Constitutional:      General: She is not in acute distress.    Appearance: Normal appearance.  Pulmonary:     Effort: Pulmonary effort is normal. No respiratory distress.     Comments: Able to carry on conversation without difficulty Skin:    Capillary Refill: Capillary refill takes less than 2 seconds.  Neurological:     General: No focal deficit present.     Mental Status: She is alert and oriented to person, place, and time.     Cranial Nerves: No cranial nerve deficit.  Psychiatric:        Mood and Affect: Mood normal.        Behavior: Behavior normal.        Thought Content: Thought content normal.        Judgment: Judgment normal.     ASSESSMENT & PLAN:     1. Sepsis due to COVID-19 [facility]) TCM  Performed. A member of the clinical team spoke with the patient upon dischare. Discharge summary was reviewed in full detail during the visit. Meds reconciled and compared to discharge meds. Medication list is updated and reviewed with the patient.  Greater than 50% face to face time was spent in counseling an coordination of care.  All questions were answered to the satisfaction of the patient.   Will recheck CBC and BMP - BMP8+eGFR; Future - CBC with Differential/Platelet; Future - Ambulatory referral to Physical Medicine Rehab  2. Leg DVT (deep vein thromboembolism) acute, bilateral (HCC)  Currently on eliquis, will refer to PT outpatient  Will recheck chest xray due to having pneumonia as well  She is to follow up with vascular as well, she did have her IVC filter removed - DG Chest 2 View; Future - Ambulatory referral to Physical Medicine Rehab - apixaban (ELIQUIS) 5 MG TABS tablet; Take 1 tablet (5 mg total) by mouth 2 (two) times daily.  Dispense: 180 tablet; Refill: 3  3. Mixed hyperlipidemia Chronic, controlled Continue with current medications - simvastatin (ZOCOR) 20 MG tablet; Take 0.5 tablets (10 mg total) by mouth daily.  Dispense: 90 tablet; Refill: 1    COVID-19 Education: The signs and symptoms of COVID-19 were discussed with the patient and how to seek care for testing (follow up with PCP or arrange E-visit).  The importance of social distancing was discussed today.  Patient Risk:   After full review of this patients clinical status, I feel that they are at least moderate risk at this time.  Time:   Today, I have spent 23 minutes/ seconds with the patient with telehealth technology discussing above diagnoses.     Medication Adjustments/Labs and Tests Ordered: Current medicines are reviewed at length with the patient today.  Concerns regarding medicines are outlined above.   Tests Ordered: Orders Placed This Encounter  Procedures  . DG Chest 2 View  .  BMP8+eGFR  . CBC with Differential/Platelet  . Ambulatory referral to Physical Medicine Rehab    Medication Changes: Meds ordered this encounter  Medications  . apixaban (ELIQUIS) 5 MG TABS tablet    Sig: Take 1  tablet (5 mg total) by mouth 2 (two) times daily.    Dispense:  180 tablet    Refill:  3  . simvastatin (ZOCOR) 20 MG tablet    Sig: Take 0.5 tablets (10 mg total) by mouth daily.    Dispense:  90 tablet    Refill:  1    Disposition:  Follow up prn  Signed, [name], FNP

## 2020-04-19 NOTE — Progress Notes (Deleted)
This visit occurred during the SARS-CoV-2 public health emergency.  Safety protocols were in place, including screening questions prior to the visit, additional usage of staff PPE, and extensive cleaning of exam room while observing appropriate contact time as indicated for disinfecting solutions.  Subjective:     Patient ID: [name] , female    DOB: 12/29/1965 , 54 y.o.   MRN: [id number]   Chief Complaint  Patient presents with   Hospitalization Follow-up    HPI  She was unable to get in to the doctors office due to the fatigue, called EMS.   She had multiple blood clots throughout her iliac, her IVC was blocked with clots. She had to have a procedure to dissolve the clots, was on heparin and had an angiovac. They also removed the IVC filter, was damaged - cut a veing and began bleeding internally. She continued to have abdominal pain - had to take oxycodone and dilaudid. She vomited and was found to have a bowel obstruction. Had to have an NG tube placed. Moved her to inpatient PT, she was able to take steps with her walker and take a few steps. [facility].    Continues having fatigue, only able to do a little before having to take a break. 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 She is to file for short term disability.      Past Medical History:  Diagnosis Date   ASCUS 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  ECC with HPV effect   Cancer of colon with rectum ([place]) 2012   Diabetes mellitus without complication ([place])    [id number](478.2)    experiences migraine every couple of weeks. 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  colposcopy adequate normal 10/2015    Hyperlipidemia    Pulmonary embolism ([place])    STD  (sexually transmitted disease) 1991   POS GC-TREATED   Thyroid disease      Family History  Problem Relation Age of Onset   Heart disease Father    Lung cancer Mother    Brain cancer Mother    Diabetes Mother    Hypertension Mother    Diabetes Brother    Kidney disease Brother    Cervical cancer Paternal Grandmother    Breast cancer Neg Hx      Current Outpatient Medications:    apixaban (ELIQUIS) 5 MG TABS tablet, Take 1 tablet (5 mg total) by mouth 2 (two) times daily., Disp: 60 tablet, Rfl: 0   cholecalciferol (VITAMIN D) 400 UNITS TABS tablet, Take 400 Units by mouth daily. , Disp: , Rfl:    cyanocobalamin 500 MCG tablet, Take 500 mcg by mouth daily., Disp: , Rfl:    Insulin Lispro (HUMALOG [place]), Inject 10 Units into the skin 3 (three) times daily with meals. , Disp: , Rfl:    JARDIANCE 25 MG TABS tablet, Take 25 mg by mouth daily., Disp: , Rfl:    levothyroxine (SYNTHROID, LEVOTHROID) 100 MCG tablet, Take 100 mcg by mouth daily before breakfast. , Disp: , Rfl:    Multiple Vitamin (MULTIVITAMIN) tablet, Take 1 tablet by mouth daily., Disp: , Rfl:    pyridOXINE (VITAMIN B-6) 100 MG tablet, Take 100 mg by mouth 2 (two) times daily. (  Patient not taking: Reported on 04/19/2020), Disp: , Rfl:    simvastatin (ZOCOR) 20 MG tablet, Take 0.5 tablets (10 mg total) by mouth daily. (Patient not taking: Reported on 04/19/2020), Disp: 90 tablet, Rfl: 1   Allergies  Allergen Reactions   Metformin And Related Other (See Comments)    Chest pain   Penicillins Hives     Review of Systems   Today's Vitals   04/19/20 1641  BP: 129/82  Pulse: 83  PainSc: 0-No pain   There is no height or weight on file to calculate BMI.   Objective:  Physical Exam      Assessment And Plan:     There are no diagnoses linked to this encounter.     ***  [name], FNP    THE PATIENT IS ENCOURAGED TO PRACTICE SOCIAL DISTANCING DUE TO THE COVID-19 PANDEMIC.

## 2020-04-20 ENCOUNTER — Other Ambulatory Visit: Payer: Self-pay | Admitting: Nurse Practitioner

## 2020-04-20 LAB — CBC WITH DIFFERENTIAL/PLATELET
Basophils Absolute: 0 10*3/uL (ref 0.0–0.2)
Basos: 1 %
EOS (ABSOLUTE): 0 10*3/uL (ref 0.0–0.4)
Eos: 1 %
Hematocrit: 38.4 % (ref 34.0–46.6)
Hemoglobin: 12.2 g/dL (ref 11.1–15.9)
Immature Grans (Abs): 0 10*3/uL (ref 0.0–0.1)
Immature Granulocytes: 0 %
Lymphocytes Absolute: 1 10*3/uL (ref 0.7–3.1)
Lymphs: 25 %
MCH: 29.9 pg (ref 26.6–33.0)
MCHC: 31.8 g/dL (ref 31.5–35.7)
MCV: 94 fL (ref 79–97)
Monocytes Absolute: 0.3 10*3/uL (ref 0.1–0.9)
Monocytes: 7 %
Neutrophils Absolute: 2.7 10*3/uL (ref 1.4–7.0)
Neutrophils: 66 %
Platelets: 407 10*3/uL (ref 150–450)
RBC: 4.08 x10E6/uL (ref 3.77–5.28)
RDW: 16 % — ABNORMAL HIGH (ref 11.7–15.4)
WBC: 4 10*3/uL (ref 3.4–10.8)

## 2020-04-20 LAB — BMP8+EGFR
BUN/Creatinine Ratio: 27 — ABNORMAL HIGH (ref 9–23)
BUN: 14 mg/dL (ref 6–24)
CO2: 22 mmol/L (ref 20–29)
Calcium: 9.6 mg/dL (ref 8.7–10.2)
Chloride: 104 mmol/L (ref 96–106)
Creatinine, Ser: 0.51 mg/dL — ABNORMAL LOW (ref 0.57–1.00)
GFR calc Af Amer: 127 mL/min/{1.73_m2} (ref >59)
GFR calc non Af Amer: 110 mL/min/{1.73_m2} (ref >59)
Glucose: 151 mg/dL — ABNORMAL HIGH (ref 65–99)
Potassium: 4.3 mmol/L (ref 3.5–5.2)
Sodium: 141 mmol/L (ref 134–144)

## 2020-04-21 ENCOUNTER — Encounter: Payer: Self-pay | Admitting: Physical Medicine and Rehabilitation

## 2020-04-25 ENCOUNTER — Ambulatory Visit (INDEPENDENT_AMBULATORY_CARE_PROVIDER_SITE_OTHER): Payer: No Typology Code available for payment source | Admitting: Nurse Practitioner

## 2020-04-25 ENCOUNTER — Encounter: Payer: Self-pay | Admitting: Nurse Practitioner

## 2020-04-25 ENCOUNTER — Other Ambulatory Visit: Payer: Self-pay

## 2020-04-25 VITALS — BP 118/72 | HR 84 | Temp 98.1°F | Ht 71.0 in | Wt 186.2 lb

## 2020-04-25 DIAGNOSIS — T8130XA Disruption of wound, unspecified, initial encounter: Secondary | ICD-10-CM | POA: Diagnosis not present

## 2020-04-25 DIAGNOSIS — R350 Frequency of micturition: Secondary | ICD-10-CM | POA: Diagnosis not present

## 2020-04-25 LAB — POCT URINALYSIS DIPSTICK
Bilirubin, UA: NEGATIVE
Blood, UA: NEGATIVE
Glucose, UA: POSITIVE — AB
Ketones, UA: NEGATIVE
Leukocytes, UA: NEGATIVE
Nitrite, UA: NEGATIVE
Protein, UA: NEGATIVE
Spec Grav, UA: 1.01 (ref 1.010–1.025)
Urobilinogen, UA: 0.2 E.U./dL
pH, UA: 5 (ref 5.0–8.0)

## 2020-04-25 LAB — POCT UA - MICROALBUMIN
Albumin/Creatinine Ratio, Urine, POC: 30
Creatinine, POC: 200 mg/dL
Microalbumin Ur, POC: 10 mg/L

## 2020-04-25 MED ORDER — HYDROGEL GEL: 1.0000 "application " | Freq: Every day | 2 refills | Status: DC

## 2020-04-25 MED ORDER — DOXYCYCLINE MONOHYDRATE 100 MG PO TABS: 100.0000 mg | ORAL_TABLET | Freq: Two times a day (BID) | ORAL | 0 refills | Status: DC

## 2020-04-25 NOTE — Patient Instructions (Signed)
   Cleanse with dial soap and water then apply hydrogel to affected areas and apply non-adhesive dressing to area once a day

## 2020-04-25 NOTE — Progress Notes (Signed)
This visit occurred during the SARS-CoV-2 public health emergency.  Safety protocols were in place, including screening questions prior to the visit, additional usage of staff PPE, and extensive cleaning of exam room while observing appropriate contact time as indicated for disinfecting solutions.  Subjective:     Patient ID: [name] , female    DOB: July 08, 1966 , 54 y.o.   MRN: [id number]   Chief Complaint  Patient presents with  . Wound Check    groin area   . Urinary Frequency    discomfort     HPI  She is here today to have a wound evaluated since being in the hospital at her site of the angiogram. The area where her groin of the removal of her IVC filter, she felt a sensation to the area.  Denies fever. Has moisture present.   She also reports she had a sore to her buttocks while in the hospital. Has been placing neosporin to the area. Better but still has pain.   Wound Check  Urinary Frequency  This is a new problem. The current episode started in the past 7 days. Quality: has a pulling sensation at the end of her urine flow.  She is not sexually active. There is no history of pyelonephritis. Associated symptoms include frequency.     Past Medical History:  Diagnosis Date  . 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  ECC with HPV effect  . Cancer of colon with rectum ([place]) 2012  . Diabetes mellitus without complication ([place])   . Headache(784.0)    experiences migraine every couple of weeks. takes only excedrin  . 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  colposcopy adequate normal 10/2015   . Hyperlipidemia   . Pulmonary embolism ([place])   . STD (sexually transmitted disease) 1991   POS GC-TREATED  . Thyroid disease      Family History  Problem Relation Age of Onset  . Heart disease Father   . Lung cancer Mother   . Brain  cancer Mother   . Diabetes Mother   . Hypertension Mother   . Diabetes Brother   . Kidney disease Brother   . Cervical cancer Paternal Grandmother   . Breast cancer Neg Hx      Current Outpatient Medications:  .  apixaban (ELIQUIS) 5 MG TABS tablet, Take 1 tablet (5 mg total) by mouth 2 (two) times daily., Disp: 180 tablet, Rfl: 3 .  cholecalciferol (VITAMIN D) 400 UNITS TABS tablet, Take 400 Units by mouth daily. , Disp: , Rfl:  .  cyanocobalamin 500 MCG tablet, Take 500 mcg by mouth daily., Disp: , Rfl:  .  Insulin Lispro (HUMALOG [place]), Inject 10 Units into the skin 3 (three) times daily with meals. , Disp: , Rfl:  .  JARDIANCE 25 MG TABS tablet, Take 25 mg by mouth daily., Disp: , Rfl:  .  levothyroxine (SYNTHROID, LEVOTHROID) 100 MCG tablet, Take 100 mcg by mouth daily before breakfast. , Disp: , Rfl:  .  Multiple Vitamin (MULTIVITAMIN) tablet, Take 1 tablet by mouth daily., Disp: , Rfl:  .  pyridOXINE (VITAMIN B-6) 100 MG tablet, Take 100 mg by mouth 2 (two) times daily. , Disp: , Rfl:  .  simvastatin (ZOCOR) 20 MG tablet, Take 0.5 tablets (10 mg total) by mouth daily., Disp: 90 tablet, Rfl: 1   Allergies  Allergen Reactions  . Metformin And Related Other (See Comments)    Chest pain  . Penicillins Hives     Review of Systems  Constitutional: Negative.   Respiratory: Negative.   Cardiovascular: Negative.   Genitourinary: Positive for frequency.  Skin: Positive for wound (right groin with open wound).  Neurological: Negative for dizziness and headaches.     Today's Vitals   04/25/20 1201  BP: 118/72  Pulse: 84  Temp: 98.1 F (36.7 C)  TempSrc: Oral  Weight: 186 lb 3.2 oz (84.5 kg)  Height: 5\' 11"  (1.803 m)  PainSc: 0-No pain   Body mass index is 25.97 kg/m.   Objective:  Physical Exam Constitutional:      General: She is not in acute distress.    Appearance: Normal appearance.  Cardiovascular:     Pulses: Normal pulses.     Heart sounds: Normal heart  sounds. No murmur heard.   Pulmonary:     Effort: Pulmonary effort is normal. No respiratory distress.     Breath sounds: Normal breath sounds.  Skin:    Capillary Refill: Capillary refill takes less than 2 seconds.     Comments: Open wound to groin with circumscribed edges where she had her procudure  Neurological:     General: No focal deficit present.     Mental Status: She is alert and oriented to person, place, and time.  Psychiatric:        Mood and Affect: Mood normal.        Behavior: Behavior normal.        Thought Content: Thought content normal.        Judgment: Judgment normal.         Assessment And Plan:     1. Wound disruption, initial encounter  Has open wound right groin with small amount yellow tissue, will order hydrogel and she is to apply dry dressing to cover  She also has small healing wound to sacral area near the crease. - Carbomer Gel Base (HYDROGEL) GEL; 1 application by Does not apply route daily.  Dispense: 100 g; Refill: 2 - Ambulatory referral to [place] - doxycycline (ADOXA) 100 MG tablet; Take 1 tablet (100 mg total) by mouth 2 (two) times daily.  Dispense: 20 tablet; Refill: 0  2. Urinary frequency  She has glucose in her urine otherwise is normal.  - POCT Urinalysis Dipstick (81002) - POCT UA - Microalbumin   [name], FNP    THE PATIENT IS ENCOURAGED TO PRACTICE SOCIAL DISTANCING DUE TO THE COVID-19 PANDEMIC.

## 2020-05-01 ENCOUNTER — Other Ambulatory Visit: Payer: Self-pay | Admitting: *Deleted

## 2020-05-01 DIAGNOSIS — I2699 Other pulmonary embolism without acute cor pulmonale: Secondary | ICD-10-CM

## 2020-05-03 ENCOUNTER — Ambulatory Visit
Admission: RE | Admit: 2020-05-03 | Discharge: 2020-05-03 | Disposition: A | Discharge status: Home / Self Care | Payer: No Typology Code available for payment source | Source: Ambulatory Visit | Attending: Nurse Practitioner | Admitting: Nurse Practitioner

## 2020-05-03 ENCOUNTER — Other Ambulatory Visit: Payer: Self-pay

## 2020-05-03 ENCOUNTER — Encounter: Payer: Self-pay | Admitting: Nurse Practitioner

## 2020-05-03 DIAGNOSIS — I2699 Other pulmonary embolism without acute cor pulmonale: Secondary | ICD-10-CM

## 2020-05-03 DIAGNOSIS — T8130XA Disruption of wound, unspecified, initial encounter: Secondary | ICD-10-CM

## 2020-05-05 ENCOUNTER — Encounter: Payer: Self-pay | Admitting: Physical Medicine and Rehabilitation

## 2020-05-05 ENCOUNTER — Encounter
Payer: No Typology Code available for payment source | Attending: Physical Medicine and Rehabilitation | Admitting: Physical Medicine and Rehabilitation

## 2020-05-05 ENCOUNTER — Other Ambulatory Visit: Payer: Self-pay

## 2020-05-05 VITALS — BP 116/77 | HR 77 | Temp 98.4°F | Ht 71.0 in | Wt 189.4 lb

## 2020-05-05 DIAGNOSIS — A4189 Other specified sepsis: Secondary | ICD-10-CM | POA: Insufficient documentation

## 2020-05-05 DIAGNOSIS — R5381 Other malaise: Secondary | ICD-10-CM | POA: Diagnosis present

## 2020-05-05 DIAGNOSIS — U071 COVID-19: Secondary | ICD-10-CM | POA: Diagnosis present

## 2020-05-05 DIAGNOSIS — I2699 Other pulmonary embolism without acute cor pulmonale: Secondary | ICD-10-CM | POA: Insufficient documentation

## 2020-05-05 NOTE — Progress Notes (Signed)
Subjective:    Patient ID: [name], female    DOB: 22-Apr-1966, 54 y.o.   MRN: [id number]  HPI  [name] is a 54 year old woman who presents for follow-up of COVID-19 sepsis. She had post-COVID bilateral pulmonary emboli. She was in the hospital for 23 days and was severely debilitated upon her return to home. She has been improving but was advised by her PCP to follow up with PM&R regarding her debility.  She would like to participate in both outpatient PT and OT. She currently has slow ambulation, difficulty with balance, muscle cramps predominantly in her left leg, impaired endurance, fatigue, brain fog, impaired initiation and motivation. She develops shortness of breath after walking for a few minutes.   She works for [employer] in [employer]. Due to her deficits, she is out of work until August 16th.   Pain Inventory Average Pain 4 Pain Right Now 2 My pain is dull, tingling and aching  In the last 24 hours, has pain interfered with the following? General activity 5 Relation with others 9 Enjoyment of life 9 What TIME of day is your pain at its worst? night Sleep (in general) Fair  Pain is worse with: sitting and inactivity Pain improves with: therapy/exercise and medication Relief from Meds: 3  Mobility walk without assistance how many minutes can you walk? 10 ability to climb steps?  no do you drive?  yes  Function I need assistance with the following:  meal prep, household duties and shopping  Neuro/Psych bladder control problems bowel control problems weakness trouble walking  Prior Studies Any changes since last visit?  no  Physicians involved in your care Any changes since last visit?  no Primary care [name] Crocker FNP   Family History  Problem Relation Age of Onset  . Heart disease Father   . Lung cancer Mother   . Brain cancer Mother   . Diabetes Mother   . Hypertension Mother   . Diabetes Brother   . Kidney disease Brother    . Cervical cancer Paternal Grandmother   . Breast cancer Neg Hx    Social History   Socioeconomic History  . Marital status: Married    Spouse name: Not on file  . Number of children: 1  . Years of education: Not on file  . Highest education level: Not on file  Occupational History  . Occupation: Engineer, structural  Tobacco Use  . Smoking status: Never Smoker  . Smokeless tobacco: Never Used  Substance and Sexual Activity  . Alcohol use: Yes    Comment: rare- twice a year  . Drug use: No  . Sexual activity: Not Currently    Comment: no periods due to radiation  Other Topics Concern  . Not on file  Social History Narrative  . Not on file   Social Determinants of Health   Financial Resource Strain:   . Difficulty of Paying Living Expenses:   Food Insecurity:   . Worried About Charity fundraiser in the Last Year:   . Arboriculturist in the Last Year:   Transportation Needs:   . Film/video editor (Medical):   Marland Kitchen Lack of Transportation (Non-Medical):   Physical Activity:   . Days of Exercise per Week:   . Minutes of Exercise per Session:   Stress:   . Feeling of Stress :   Social Connections:   . Frequency of Communication with Friends and Family:   . Frequency  of Social Gatherings with Friends and Family:   . Attends Religious Services:   . Active Member of Clubs or Organizations:   . Attends Archivist Meetings:   Marland Kitchen Marital Status:    Past Surgical History:  Procedure Laterality Date  . AORTA - BILATERAL FEMORAL ARTERY BYPASS GRAFT N/A 03/29/2020   Procedure: VENO VENOUS BYPASS;  Surgeon: Waynetta Sandy, MD;  Location: [facility];  Service: Vascular;  Laterality: N/A;  . APPLICATION OF [place] N/A 03/29/2020   Procedure: [facility] MECHANICAL THROMBECTOMY;  Surgeon: Waynetta Sandy, MD;  Location: [place];  Service: Vascular;  Laterality: N/A;  . CESAREAN SECTION  2005  . COLON SURGERY    . ENDOVASCULAR STENT INSERTION N/A 03/29/2020    Procedure: Inferior Vena Cava WallStent Graft Insertion;  Surgeon: Waynetta Sandy, MD;  Location: [place];  Service: Vascular;  Laterality: N/A;  . INSERTION OF VENA CAVA FILTER Right 2012  . none    . PORT-A-CATH REMOVAL Right 12/22/2012   Procedure: REMOVAL PORT-A-CATH;  Surgeon: Odis Hollingshead, MD;  Location: [facility];  Service: General;  Laterality: Right;  . PORTACATH PLACEMENT  02/21/11   tip in mid [id number]  . 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 THROMBECTOMY ILIAC ARTERY N/A 03/29/2020   Procedure: THROMBECTOMY ILIAC ARTERY MECHANICAL THROMBECTOMY;  Surgeon: Waynetta Sandy, MD;  Location: [place] [place];  Service: Vascular;  Laterality: N/A;   Past Medical History:  Diagnosis Date  . 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  ECC with HPV effect  . 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 Diabetes mellitus without complication ([place])   . Headache(784.0)    experiences migraine every couple of weeks. takes only excedrin  . 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  colposcopy adequate normal 10/2015   . Hyperlipidemia   . Pulmonary embolism ([place])   . STD (sexually transmitted disease) 1991   POS GC-TREATED  . Thyroid disease    BP 116/77   Pulse 77   Temp 98.4 F (36.9 C)   Ht 5\' 11"  (1.803 m)   Wt 189 lb 6.4 oz (85.9 kg)   LMP 09/12/2011   SpO2 97%   BMI 26.42 kg/m   Opioid Risk Score:   Fall Risk Score:  `1  Depression screen PHQ 2/9  Depression screen [facility] 2/9 05/05/2020 03/14/2020 12/03/2018  Decreased Interest 0 0 0  Down, Depressed, Hopeless 0 0 0  PHQ - 2 Score 0 0 0  Altered sleeping 2 - -  Tired, decreased energy 2 - -  Change in appetite 0 - -  Feeling bad or failure about yourself  0 - -  Trouble concentrating 1 - -  Moving slowly or fidgety/restless 2 - -  Suicidal thoughts 0 - -   PHQ-9 Score 7 - -  Some recent data might be hidden   Review of Systems  Constitutional: Negative.   HENT: Negative.   Eyes: Negative.   Respiratory: Negative.   Cardiovascular: Negative.   Gastrointestinal: Positive for diarrhea.  Endocrine:       High blood sugars  Genitourinary: Positive for urgency.  Musculoskeletal: Positive for gait problem.       Leg weakness  Skin: Positive for wound.       Groin from IVC removal  Allergic/Immunologic: Negative.   Neurological: Positive  for weakness.  Hematological: Bruises/bleeds easily.       Eliquis  Psychiatric/Behavioral: Negative.   All other systems reviewed and are negative.      Objective:   Physical Exam Gen: no distress, normal appearing HEENT: oral mucosa pink and moist, NCAT Cardio: Reg rate Chest: normal effort, normal rate of breathing Abd: soft, non-distended Ext: no edema Skin: Well healing incision sites on anterior chest wall.  Neuro: 5/5 strength in upper extremities. 4+/5 strength in proximal lower extremities, and 4-/5 strength in distal. Slow gait up and down hallway without assistive device.  Psych: pleasant, normal affect    Assessment & Plan:  Mrs. Radford is a 54 year old woman who presents to establish care following [id number] sepsis complicated by bilateral pulmonary emboli, resulting in debility. Outpatient internal medicine and inpatient hospital notes reviewed.   -Prescribed outpatient PT and OT to address deficits in strength, endurance, balance, ability to climb stairs and to perform ADLS such as cooking and laundry.  -Discussed plan of return to work.  -Examined anterior chest wall angiovac incisions. Recommend coconut oil application to promote wound healing.   -Discussed SLP for brain fog, will defer at this time.  -Dicussed cardiopulmonary rehab in future if she maintains with impaired endurance after 6-8 weeks of PT and OT.   -Discussed nerve regeneration, expected symptoms from  [place]  All questions answered. RTC in 6 weeks.

## 2020-05-08 ENCOUNTER — Ambulatory Visit (HOSPITAL_COMMUNITY)
Admission: RE | Admit: 2020-05-08 | Discharge: 2020-05-08 | Disposition: A | Discharge status: Home / Self Care | Payer: No Typology Code available for payment source | Source: Ambulatory Visit | Attending: Surgery | Admitting: Surgery

## 2020-05-08 ENCOUNTER — Other Ambulatory Visit: Payer: Self-pay

## 2020-05-08 DIAGNOSIS — I2699 Other pulmonary embolism without acute cor pulmonale: Secondary | ICD-10-CM

## 2020-05-11 ENCOUNTER — Telehealth: Payer: Self-pay

## 2020-05-11 NOTE — Telephone Encounter (Signed)
I called patient and advised her that her forms have been completed and faxed. I will mail her forms to her as requested. YL,RMA

## 2020-05-19 ENCOUNTER — Encounter: Payer: Self-pay | Admitting: Vascular Surgery

## 2020-05-19 ENCOUNTER — Other Ambulatory Visit: Payer: Self-pay

## 2020-05-19 ENCOUNTER — Ambulatory Visit (INDEPENDENT_AMBULATORY_CARE_PROVIDER_SITE_OTHER): Payer: No Typology Code available for payment source | Admitting: Vascular Surgery

## 2020-05-19 VITALS — BP 109/62 | HR 78 | Temp 97.2°F | Resp 14 | Ht 71.0 in | Wt 189.0 lb

## 2020-05-19 DIAGNOSIS — I82423 Acute embolism and thrombosis of iliac vein, bilateral: Secondary | ICD-10-CM

## 2020-05-19 NOTE — Progress Notes (Signed)
°  ° °  Subjective:     Patient ID: [name], female   DOB: Apr 10, 1966, 54 y.o.   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  She had a filter removed that had been placed in 2012 the time she had colon cancer.  This time her DVT was related to Covid 19.  Unfortunately after the removal of her filter she developed an ileus and remained in the hospital for approximately 2 weeks.  She states that over that time.  She lost about 20 to 25 pounds.  She is now doing very well does have some weakness in her bilateral lower extremities and occasional pain.  Swelling has completely resolved.  She remains on Eliquis.  She is eating without limitation and is having normal bowel function.   Review of Systems Fatigue Bilateral lower extremity weakness    Objective:   Physical Exam Vitals:   05/19/20 0959  BP: (!) 109/62  Pulse: 78  Resp: 14  Temp: (!) 97.2 F (36.2 C)  SpO2: 97%  Awake alert oriented On lab respirations Bilateral lower extremities without swelling Palpable dorsalis pedis pulses bilaterally     Assessment/plan     54 year old female status post angio VAC mechanical thrombectomy stenting of her IVC after removal of filter.  This was her second incidence of DVT but both appear provoked the first being related to colon cancer in 2012 the second being related to Covid positivity with IVC filter in place.  Filter is now removed has been exchanged for stent.  I discussed with her the need for anticoagulation for likely 1 year although could be stopped if needed for procedure such as tooth extraction or colonoscopy.  We would likely transition to indefinite aspirin after that.  She can wear compression stockings as needed but they are not necessary.  She will follow-up in 6 months with IVC iliac duplex.    Ryu Cerreta C. Donzetta Matters, MD [facility] Office: [phone] Pager: [phone]

## 2020-05-25 ENCOUNTER — Other Ambulatory Visit: Payer: Self-pay | Admitting: *Deleted

## 2020-05-25 DIAGNOSIS — I82423 Acute embolism and thrombosis of iliac vein, bilateral: Secondary | ICD-10-CM

## 2020-05-29 ENCOUNTER — Ambulatory Visit: Payer: No Typology Code available for payment source | Attending: Internal Medicine

## 2020-05-29 ENCOUNTER — Other Ambulatory Visit: Payer: Self-pay

## 2020-05-29 VITALS — BP 118/76 | HR 62

## 2020-05-29 DIAGNOSIS — R262 Difficulty in walking, not elsewhere classified: Secondary | ICD-10-CM | POA: Diagnosis not present

## 2020-05-29 DIAGNOSIS — R2689 Other abnormalities of gait and mobility: Secondary | ICD-10-CM | POA: Diagnosis present

## 2020-05-29 DIAGNOSIS — M6281 Muscle weakness (generalized): Secondary | ICD-10-CM | POA: Diagnosis present

## 2020-05-29 NOTE — Therapy (Signed)
[facility] [address] [place], [name], [address] Phone: [phone]   Fax:  [phone]  Physical Therapy Evaluation  Patient Details  Name: MARQUISA SALIH MRN: [id number] Date of Birth: 1966-04-10 Referring Provider (PT): Dr. Ranell Patrick   Encounter Date: 05/29/2020   PT End of Session - 05/29/20 0846    Visit Number 1    Number of Visits 10    PT Start Time 0800    PT Stop Time 0846    PT Time Calculation (min) 46 min    Activity Tolerance Patient tolerated treatment well    Behavior During Therapy [facility] for tasks assessed/performed           Past Medical History:  Diagnosis Date  . 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  ECC with HPV effect  . 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 Diabetes mellitus without complication ([place])   . Headache(784.0)    experiences migraine every couple of weeks. takes only excedrin  . 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  colposcopy adequate normal 10/2015   . Hyperlipidemia   . Pulmonary embolism ([place])   . STD (sexually transmitted disease) 1991   POS GC-TREATED  . Thyroid disease     Past Surgical History:  Procedure Laterality Date  . AORTA - BILATERAL FEMORAL ARTERY BYPASS GRAFT N/A 03/29/2020   Procedure: VENO VENOUS BYPASS;  Surgeon: Waynetta Sandy, MD;  Location: [facility];  Service: Vascular;  Laterality: N/A;  . APPLICATION OF [place] N/A 03/29/2020   Procedure: [facility] MECHANICAL THROMBECTOMY;  Surgeon: Waynetta Sandy, MD;  Location: [facility];  Service: Vascular;  Laterality: N/A;  . CESAREAN SECTION  2005  . COLON SURGERY    . ENDOVASCULAR STENT INSERTION N/A 03/29/2020   Procedure: Inferior Vena Cava WallStent Graft Insertion;  Surgeon: Waynetta Sandy, MD;  Location: [facility];  Service: Vascular;   Laterality: N/A;  . INSERTION OF VENA CAVA FILTER Right 2012  . none    . PORT-A-CATH REMOVAL Right 12/22/2012   Procedure: REMOVAL PORT-A-CATH;  Surgeon: Odis Hollingshead, MD;  Location: [facility];  Service: General;  Laterality: Right;  . PORTACATH PLACEMENT  02/21/11   tip in mid [id number]-[id number]  . 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 THROMBECTOMY ILIAC ARTERY N/A 03/29/2020   Procedure: THROMBECTOMY ILIAC ARTERY MECHANICAL THROMBECTOMY;  Surgeon: Waynetta Sandy, MD;  Location: [facility];  Service: Vascular;  Laterality: N/A;    Vitals:   05/29/20 0758  BP: 118/76  Pulse: 62  SpO2: 98%      Subjective Assessment - 05/29/20 0758    Subjective Admitted 6/1-6/21 for septic shock due to [place]. 54 y.o. female with PMHx of DM-2, HLD, hypothyroidism, PE-s/p IVC filter in 2012, history of colon cancer-diagnosed with COVID-19 on 5/21-presented with fatigue/malaise, low back pain-found to have patchy airspace disease on CTA chest, MRI thoracic/lumbar spine showed T7 disc herniation-but also showed extensive clot in the IVC.  Subsequently a Doppler showed extensive DVT in the bilateral lower extremity.  She was started on IV heparin-vascular surgery was consulted underwent catheter guided lytic therapy-subsequent mechanical thrombectomy/IVC filter removal-complicated by IVC tear requiring IVC stenting.  Postoperatively-developed acute blood loss anemia with hypovolemic shock secondary to blood loss-monitored in the ICU-briefly required pressors.  Once stabilized-she was transferred to a medical  unit-while awaiting CIR bed-she started developing vomiting-ultimately found to have ileus/partial small bowel obstruction. Pt reports that she is still having trouble with walking long distances, prolonged standing, stairs due to weakness. Pt reports legs ache and get weak. Pt reports that she walked up stair case at movies yesterday and she was exhausted after. Prior to all this she was  independent with everything with no difficulty. Used to take dance classes.    Pertinent History PMHx of DM-2, HLD, hypothyroidism, PE-s/p IVC filter in 2012, history of colon cancer- 2012    Patient Stated Goals Pt wants to get back to her prior status.    Currently in Pain? Yes    Pain Score 3     Pain Location Leg    Pain Orientation Left;Right    Pain Descriptors / Indicators Aching;Tingling    Pain Type Chronic pain    Aggravating Factors  when first gets up in morning or when gets up after sitting too long, standing in one place too long.    Pain Relieving Factors rest              [facility] PT Assessment - 05/29/20 0803      Assessment   Medical Diagnosis post-Covid debility    Referring Provider (PT) Dr. Ranell Patrick    Onset Date/Surgical Date 03/21/20    Hand Dominance Left    Prior Therapy a little in hospital      Precautions   Precautions None      Balance Screen   Has the patient fallen in the past 6 months No    Has the patient had a decrease in activity level because of a fear of falling?  No    Is the patient reluctant to leave their home because of a fear of falling?  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      Sensation   Light Touch Appears Intact    Additional Comments reports she does get some prickling in feet at times since chemo years ago      ROM / Strength   AROM / PROM / Strength Strength      Strength   Strength Assessment Site Shoulder;Elbow;Hand;Hip;Knee;Ankle    Right/Left Shoulder Right;Left    Right Shoulder Flexion 5/5    Left Shoulder Flexion 5/5    Right/Left Elbow Right;Left    Right Elbow Flexion 5/5  Right Elbow Extension 5/5    Left Elbow Flexion 5/5    Left Elbow Extension 5/5    Right/Left hand Right;Left    Right Hand Gross Grasp Functional    Left Hand Gross Grasp Functional    Right/Left Hip Right;Left    Right Hip Flexion 4/5    Right Hip ABduction 4/5    Left Hip Flexion 4/5    Left Hip ABduction 4/5   pain medial thigh   Right/Left Knee Right;Left    Right Knee Flexion 4/5    Right Knee Extension 4/5    Left Knee Flexion 4/5    Left Knee Extension 4/5    Right/Left Ankle Right;Left    Right Ankle Dorsiflexion 4/5    Left Ankle Dorsiflexion 4/5      Bed Mobility   Bed Mobility Rolling Right;Rolling Left;Supine to Sit;Sit to Supine    Rolling Right Independent    Rolling Left Independent    Supine to Sit Independent    Sit to Supine Independent      Transfers   Transfers Sit to Stand;Stand to Sit    Sit to Stand 7: Independent    Stand to Sit 7: Independent    Comments 30 sec sit to stand=11 from chair without hands, RPE=6/10, O2=97%, HR=92      Ambulation/Gait   Ambulation/Gait Yes    Ambulation/Gait Assistance 5: Supervision    Ambulation Distance (Feet) 960 Feet    Gait Pattern Step-through pattern;Antalgic   left   Ambulation Surface Level;Indoor    Gait velocity 0.62m/s    Stairs Yes    Stairs Assistance 5: Supervision     Stairs Assistance Details (indicate cue type and reason) reciprocal up but turned partially side ways. Step-to down.    Stair Management Technique One rail Right    Number of Stairs 4      6 Minute Walk- Baseline   6 Minute Walk- Baseline yes    BP (mmHg) 118/76    HR (bpm) 62    02 Sat (%RA) 98 %      6 Minute walk- Post Test   6 Minute Walk Post Test yes    BP (mmHg) 128/88    HR (bpm) 66    02 Sat (%RA) 98 %    Modified Borg Scale for Dyspnea 6-      6 minute walk test results    Aerobic Endurance Distance Walked 960    Endurance additional comments Pt started to develop discomfort mostly in left leg about half way in and slowing down. Reports leg feels weak the longer she goes. Resided pretty quickly when sat down.      Standardized Balance Assessment   Standardized Balance Assessment Timed Up and Go Test      Timed Up and Go Test   TUG Normal TUG    Normal TUG (seconds) 8.3      Functional Gait  Assessment   Gait assessed  Yes    Gait Level Surface Walks 20 ft in less than 7 sec but greater than 5.5 sec, uses assistive device, slower speed, mild gait deviations, or deviates 6-10 in outside of the 12 in walkway width.    Change in Gait Speed Able to change speed, demonstrates mild gait deviations, deviates 6-10 in outside of the 12 in walkway width, or no gait deviations, unable to achieve a major change in velocity, or uses a change in velocity, or uses an assistive device.    Gait with  Horizontal Head Turns Performs head turns smoothly with no change in gait. Deviates no more than 6 in outside 12 in walkway width    Gait with Vertical Head Turns Performs head turns with no change in gait. Deviates no more than 6 in outside 12 in walkway width.    Gait and Pivot Turn Pivot turns safely within 3 sec and stops quickly with no loss of balance.    Step Over Obstacle Is able to step over one shoe box (4.5 in total height) but must slow down and adjust steps to clear box safely. May  require verbal cueing.    Gait with Narrow Base of Support Is able to ambulate for 10 steps heel to toe with no staggering.    Gait with Eyes Closed Walks 20 ft, slow speed, abnormal gait pattern, evidence for imbalance, deviates 10-15 in outside 12 in walkway width. Requires more than 9 sec to ambulate 20 ft.    Ambulating Backwards Walks 20 ft, uses assistive device, slower speed, mild gait deviations, deviates 6-10 in outside 12 in walkway width.    Steps Two feet to a stair, must use rail.    Total Score 21                      Objective measurements completed on examination: See above findings.               PT Education - 05/29/20 0846    Education Details Sit to stand 5 x 2, daily walking 4-5 min 2x/day. Discussed PT plan of care.    Person(s) Educated Patient    Methods Explanation    Comprehension Verbalized understanding            PT Short Term Goals - 05/29/20 1245      PT SHORT TERM GOAL #1   Title Pt will be independent with initial HEP for strengthening and walking program.    Time 3    Period Weeks    Status New    Target Date 06/19/20      PT SHORT TERM GOAL #2   Title Pt will increase 30 sec sit to stand from 11 reps to 13 or more for improved functional strength and endurance.    Baseline 05/29/20 11 reps from chair without hands    Time 3    Period Weeks    Status New    Target Date 06/19/20      PT SHORT TERM GOAL #3   Title Pt will ambulate >1000' on varied surfaces independently for improved gait in community.    Time 3    Period Weeks    Status New    Target Date 06/19/20             PT Long Term Goals - 05/29/20 1247      PT LONG TERM GOAL #1   Title Pt will be independent with progressive HEP for strengthening, balance and aerobic exercises to be able to return to her dance classes.    Time 6    Period Weeks    Status New    Target Date 07/10/20      PT LONG TERM GOAL #2   Title Pt will be able to ambulate  up/down 12 steps without railing in reciprocal pattern for improved functional strength.    Time 6    Period Weeks    Status New    Target Date 07/10/20  PT LONG TERM GOAL #3   Title Pt will increase FGA from 21/30 to >25/30 for improved balance and gait safety.    Baseline 05/29/20 21/30    Time 6    Period Weeks    Status New    Target Date 07/10/20      PT LONG TERM GOAL #4   Title Pt will increase 6 min walk from 960' to >1200' with <4/10 RPE for improved activity tolerance.    Baseline 05/29/20 960' with 6/10 RPE    Time 6    Period Weeks    Status New    Target Date 07/10/20                  Plan - 05/29/20 1236    Clinical Impression Statement Pt is 54 y/o female who presents with post-Covid debility with extensive hospital stay due to BLE DVTs. She reports biggest issues are weakness in legs, fatigue and pain in legs when up too long. Pt does have decreased strength grossly 4/5 in BLE. She was able to complete 6 min walk test covering 960' but slowed significantly towards end with increased discomfort in LLE. RPE 6/10 with activities. Good vital response. Gait speed is decreased from norms but safe for community ambulator at 0.19m/s. Pt is fall risk based on FGA score of 21/30. She has decreased functional strength with 30 sec sit to stand of 11 reps. Pt will benefit from skilled PT to address strength, balance and functional mobility deficits to improve her function.    Personal Factors and Comorbidities Comorbidity 3+    Comorbidities DM-2, HLD, hypothyroidism, PE-s/p IVC filter in 2012, history of colon cancer    Examination-Activity Limitations Locomotion Level;Stairs    Examination-Participation Restrictions Community Activity;Yard Work;Cleaning    Stability/Clinical Decision Making Stable/Uncomplicated    Clinical Decision Making Moderate    Rehab Potential Good    PT Frequency 2x / week   followed by 1x/week for 3 weeks   PT Duration 3 weeks    PT  Treatment/Interventions ADLs/Self Care Home Management;Gait training;Stair training;Functional mobility training;Therapeutic activities;Therapeutic exercise;Patient/family education;Neuromuscular re-education;Balance training;Manual techniques;Vestibular    PT Next Visit Plan How is she doing with walking more at home? Work on walking for improving activity tolerance, possibly try Insurance risk surveyor or NuStep for aerobic activity. BLE strengthening to issue initial HEP. I started on sit to stanc 5 x 2 verbally.    Consulted and Agree with Plan of Care Patient           Patient will benefit from skilled therapeutic intervention in order to improve the following deficits and impairments:  Abnormal gait, Decreased activity tolerance, Decreased balance, Decreased endurance, Decreased mobility, Decreased strength, Difficulty walking, Pain  Visit Diagnosis: Difficulty in walking, not elsewhere classified  Other abnormalities of gait and mobility  Muscle weakness (generalized)     Problem List Patient Active Problem List   Diagnosis Date Noted  . Sepsis due to COVID-19 ([place]) 03/22/2020  . Type 2 diabetes mellitus without complication, without long-term current use of insulin ([place]) 12/03/2018  . Hypothyroidism 07/08/2018  . Bilateral calf pain 06/08/2015  . Chest pain 08/22/2014  . Hyperlipidemia 07/13/2014  . Family history of hypertrophic cardiomyopathy 07/13/2014  . DOE (dyspnea on exertion) 07/13/2014  . Diabetes ([place]) 11/01/2013  . Adiposity 11/01/2013  . Bilateral pulmonary embolism ([place]) 11/01/2013  . Obesity 11/01/2013  . Diabetes mellitus   . Malignant neoplasm of rectum ([place]) 08/29/2011    Electa Sniff, PT,  DPT, NCS 05/29/2020, 12:50 PM  [facility] [address] [place], [name], [address] Phone: [phone]   Fax:  [phone]  Name: RANDAL YEPIZ MRN: [id number] Date of Birth: 1966-09-05

## 2020-06-01 ENCOUNTER — Other Ambulatory Visit: Payer: Self-pay

## 2020-06-01 ENCOUNTER — Ambulatory Visit (INDEPENDENT_AMBULATORY_CARE_PROVIDER_SITE_OTHER): Payer: No Typology Code available for payment source | Admitting: Nurse Practitioner

## 2020-06-01 ENCOUNTER — Encounter: Payer: Self-pay | Admitting: Nurse Practitioner

## 2020-06-01 VITALS — BP 116/68 | HR 58 | Temp 97.5°F | Ht 71.0 in | Wt 191.0 lb

## 2020-06-01 DIAGNOSIS — E119 Type 2 diabetes mellitus without complications: Secondary | ICD-10-CM | POA: Diagnosis not present

## 2020-06-01 DIAGNOSIS — E039 Hypothyroidism, unspecified: Secondary | ICD-10-CM

## 2020-06-01 DIAGNOSIS — I82403 Acute embolism and thrombosis of unspecified deep veins of lower extremity, bilateral: Secondary | ICD-10-CM

## 2020-06-01 DIAGNOSIS — E782 Mixed hyperlipidemia: Secondary | ICD-10-CM | POA: Diagnosis not present

## 2020-06-01 DIAGNOSIS — I2699 Other pulmonary embolism without acute cor pulmonale: Secondary | ICD-10-CM

## 2020-06-01 LAB — LIPID PANEL
Chol/HDL Ratio: 3.2 ratio (ref 0.0–4.4)
Cholesterol, Total: 193 mg/dL (ref 100–199)
HDL: 61 mg/dL (ref >39)
LDL Chol Calc (NIH): 113 mg/dL — ABNORMAL HIGH (ref 0–99)
Triglycerides: 106 mg/dL (ref 0–149)
VLDL Cholesterol Cal: 19 mg/dL (ref 5–40)

## 2020-06-01 NOTE — Progress Notes (Signed)
Rutherford Nail as a scribe for [name], FNP.,have documented all relevant documentation on the behalf of [name], FNP,as directed by  [name], FNP while in the presence of [name], [place].  This visit occurred during the SARS-CoV-2 public health emergency.  Safety protocols were in place, including screening questions prior to the visit, additional usage of staff PPE, and extensive cleaning of exam room while observing appropriate contact time as indicated for disinfecting solutions.  Subjective:     Patient ID: [name] , female    DOB: 12/30/1965 , 54 y.o.   MRN: [id number]   Chief Complaint  Patient presents with  . Follow-up    COVID f/u    HPI  She is here today to see how she is doing to return to work. She continues to work from home. Breathing okay. The area to her groin is healing.  No longer having a cough.  Continues to have intermittent weakness. Her first PT appt is tomorrow.  Dr. Donzetta Matters vascular did an MRI to check the clots  Diabetes She presents for her follow-up diabetic visit. She has type 2 diabetes mellitus. There are no hypoglycemic associated symptoms. There are no diabetic associated symptoms. Risk factors for coronary artery disease include sedentary lifestyle. Current diabetic treatment includes oral agent (dual therapy). (Average 140's )     Past Medical History:  Diagnosis Date  . 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  ECC with HPV effect  . 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 Diabetes mellitus without complication ([place])   . Headache(784.0)    experiences migraine every couple of weeks. takes only excedrin  . 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  colposcopy adequate normal 10/2015   . Hyperlipidemia   . Pulmonary embolism ([place])   . STD (sexually transmitted disease) 1991   POS  GC-TREATED  . Thyroid disease      Family History  Problem Relation Age of Onset  . Heart disease Father   . Lung cancer Mother   . Brain cancer Mother   . Diabetes Mother   . Hypertension Mother   . Diabetes Brother   . Kidney disease Brother   . Cervical cancer Paternal Grandmother   . Breast cancer Neg Hx      Current Outpatient Medications:  .  apixaban (ELIQUIS) 5 MG TABS tablet, Take 1 tablet (5 mg total) by mouth 2 (two) times daily., Disp: 180 tablet, Rfl: 3 .  cholecalciferol (VITAMIN D) 400 UNITS TABS tablet, Take 400 Units by mouth daily. , Disp: , Rfl:  .  cyanocobalamin 500 MCG tablet, Take 500 mcg by mouth daily., Disp: , Rfl:  .  Insulin Lispro (HUMALOG [place]), Inject 10 Units into the skin 3 (three) times daily with meals. , Disp: , Rfl:  .  JARDIANCE 25 MG TABS tablet, Take 25 mg by mouth daily., Disp: , Rfl:  .  levothyroxine (SYNTHROID, LEVOTHROID) 100 MCG tablet, Take 100 mcg by mouth daily before breakfast. , Disp: , Rfl:  .  Multiple Vitamin (MULTIVITAMIN) tablet, Take 1 tablet by mouth daily., Disp: , Rfl:  .  simvastatin (ZOCOR) 20 MG tablet, Take 0.5 tablets (10 mg total) by mouth daily., Disp: 90 tablet, Rfl: 1 .  Ascorbic Acid (VITAMIN C ADULT GUMMIES PO), Take 1 tablet by mouth daily., Disp: , Rfl:  .  Zinc 100 MG TABS, Take 1 tablet by mouth daily., Disp: , Rfl:    Allergies  Allergen Reactions  . Metformin And Related Other (See Comments)    Chest pain  . Penicillins Hives     Review of Systems  Constitutional: Negative.   HENT: Negative.   Respiratory: Negative.   Skin: Negative.   Neurological: Negative.      Today's Vitals   06/01/20 1030  BP: 116/68  Pulse: (!) 58  Temp: (!) 97.5 F (36.4 C)  TempSrc: Oral  Weight: 191 lb (86.6 kg)  Height: 5\' 11"  (1.803 m)  PainSc: 0-No pain   Body mass index is 26.64 kg/m.   Objective:  Physical Exam Constitutional:      General: She is not in acute distress.    Appearance: Normal  appearance. She is well-developed.  Cardiovascular:     Rate and Rhythm: Normal rate and regular rhythm.     Heart sounds: Normal heart sounds. No murmur heard.   Pulmonary:     Effort: Pulmonary effort is normal. No respiratory distress.     Breath sounds: Normal breath sounds.  Chest:     Chest wall: No tenderness.  Musculoskeletal:        General: Normal range of motion.     Cervical back: Normal range of motion and neck supple.  Skin:    General: Skin is warm and dry.     Capillary Refill: Capillary refill takes less than 2 seconds.     Comments: Slight keloid to right upper neck and left upper chest area.   Neurological:     General: No focal deficit present.     Mental Status: She is alert and oriented to person, place, and time.     Cranial Nerves: No cranial nerve deficit.  Psychiatric:        Mood and Affect: Mood normal.        Behavior: Behavior normal.        Thought Content: Thought content normal.        Judgment: Judgment normal.         Assessment And Plan:     1. Type 2 diabetes mellitus without complication, without long-term current use of insulin (HCC)  Chronic, stable, continue follow up with Dr. Chalmers Cater  Continue with current medications  Encouraged to limit intake of sugary foods and drinks  Encouraged to increase physical activity to 150 minutes per week as tolertated  2. Mixed hyperlipidemia  Chronic, controlled  Continue with current medications - Lipid panel  3. Hypothyroidism, unspecified type  Chronic, controlled  Continue with current medications  Continue follow up with Dr. Chalmers Cater  4. Leg DVT (deep venous thromboembolism), acute, bilateral ([place])  She is feeling much better, I will refer her to hematology for further evaluation likely related to having covid however she has had multiple DVTs in the past - Ambulatory referral to Hematology  5. Bilateral pulmonary embolism ([place])  She is to continue with Eliquis, will refer to  hematology as well  Her green[place] filter was removed while hospitalized for covid  She has improved significantly - Ambulatory referral to Hematology     Patient was given opportunity to ask questions. Patient verbalized understanding of the plan and was able to repeat key elements of the plan. All questions were answered to their satisfaction.  [name], FNP   I, [name], FNP, have reviewed all documentation for this visit. The documentation on 06/12/20 for the exam, diagnosis, procedures,  and orders are all accurate and complete.  THE PATIENT IS ENCOURAGED TO PRACTICE SOCIAL DISTANCING DUE TO THE COVID-19 PANDEMIC.

## 2020-06-02 ENCOUNTER — Ambulatory Visit: Payer: No Typology Code available for payment source | Admitting: Physical Therapy

## 2020-06-02 DIAGNOSIS — R262 Difficulty in walking, not elsewhere classified: Secondary | ICD-10-CM

## 2020-06-02 DIAGNOSIS — M6281 Muscle weakness (generalized): Secondary | ICD-10-CM

## 2020-06-02 DIAGNOSIS — R2689 Other abnormalities of gait and mobility: Secondary | ICD-10-CM

## 2020-06-02 NOTE — Patient Instructions (Signed)
Access Code: [email or web address] URL: [email or web address] Date: 06/02/2020 Prepared by: Janann August  Program Notes Daily walking beginning at 4-5 min 2x/day  At the [facility]: using the NuStep/recumbent Bike For the NuStep, we started at gear 4 and went for 7 minutes.    Exercises Sit to Stand - 2 x daily - 7 x weekly - 2 sets - 5 reps Marching Bridge - 1-2 x daily - 5 x weekly - 2 sets - 5 reps Clamshell with Resistance - 1-2 x daily - 5 x weekly - 2 sets - 10 reps Standing Marching - 1-2 x daily - 5 x weekly - 3 sets Side Stepping with Resistance at Thighs - 1-2 x daily - 5 x weekly - 3 sets

## 2020-06-02 NOTE — Therapy (Signed)
[facility] [address] [place], [name], [address] Phone: [phone]   Fax:  [phone]  Physical Therapy Treatment  Patient Details  Name: [name] MRN: [id number] Date of Birth: [birth year] Referring Provider (PT): Dr. Ranell Patrick   Encounter Date: 06/02/2020   PT End of Session - 06/02/20 0756    Visit Number 2    Number of Visits 10    PT Start Time 0716    PT Stop Time 0756    PT Time Calculation (min) 40 min    Activity Tolerance Patient tolerated treatment well    Behavior During Therapy [facility] for tasks assessed/performed           Past Medical History:  Diagnosis Date  . 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  ECC with HPV effect  . 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 Diabetes mellitus without complication ([place])   . Headache(784.0)    experiences migraine every couple of weeks. takes only excedrin  . 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  colposcopy adequate normal 10/2015   . Hyperlipidemia   . Pulmonary embolism ([place])   . STD (sexually transmitted disease) 1991   POS GC-TREATED  . Thyroid disease     Past Surgical History:  Procedure Laterality Date  . AORTA - BILATERAL FEMORAL ARTERY BYPASS GRAFT N/A 03/29/2020   Procedure: VENO VENOUS BYPASS;  Surgeon: Waynetta Sandy, MD;  Location: [facility];  Service: Vascular;  Laterality: N/A;  . APPLICATION OF [place] N/A 03/29/2020   Procedure: [facility] MECHANICAL THROMBECTOMY;  Surgeon: Waynetta Sandy, MD;  Location: [facility];  Service: Vascular;  Laterality: N/A;  . CESAREAN SECTION  2005  . COLON SURGERY    . ENDOVASCULAR STENT INSERTION N/A 03/29/2020   Procedure: Inferior Vena Cava WallStent Graft Insertion;  Surgeon: Waynetta Sandy, MD;  Location: [facility];  Service: Vascular;   Laterality: N/A;  . INSERTION OF VENA CAVA FILTER Right 2012  . none    . PORT-A-CATH REMOVAL Right 12/22/2012   Procedure: REMOVAL PORT-A-CATH;  Surgeon: Odis Hollingshead, MD;  Location: [facility];  Service: General;  Laterality: Right;  . PORTACATH PLACEMENT  02/21/11   tip in mid svc-rosenbower  . 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 THROMBECTOMY ILIAC ARTERY N/A 03/29/2020   Procedure: THROMBECTOMY ILIAC ARTERY MECHANICAL THROMBECTOMY;  Surgeon: Waynetta Sandy, MD;  Location: [facility];  Service: Vascular;  Laterality: N/A;    There were no vitals filed for this visit.   Subjective Assessment - 06/02/20 0719    Subjective No changes since eval. Has not tried the walking at home, but has tried the sit <> stands. L leg was feeling sore after the eval, but it is feeling better today. Will be going to the [employer] with her husband and son.    Pertinent History PMHx of DM-2, HLD, hypothyroidism, PE-s/p IVC filter in 2012, history of colon cancer- 2012    Patient Stated Goals Pt wants to get back to her prior status.    Currently in Pain? No/denies                             [facility] Adult PT Treatment/Exercise - 06/02/20 0725      Ambulation/Gait   Ambulation/Gait  Yes    Ambulation/Gait Assistance 6: Modified independent (Device/Increase time)    Ambulation Distance (Feet) 460 Feet    Assistive device None    Gait Pattern Step-through pattern    Ambulation Surface Level;Indoor      Exercises   Exercises Knee/Hip      Knee/Hip Exercises: Aerobic   Nustep With BLE and BUE for strengthening and activity tolerance at gear 4 for 7 minutes - HR after: 90 bpm  O2 after: 98%       Knee/Hip Exercises: Standing   Forward Step Up Left;2 sets;10 reps;Hand Hold: 1;Hand Hold: 0;Step Height: 6"    Forward Step Up Limitations x10 reps single handle hold, x10 reps no hand hold cues cues to float non-stance leg              Access Code: [email or web address] URL:  [email or web address] Date: 06/02/2020 Prepared by: Janann August  Program Notes Daily walking beginning at 4-5 min 2x/day  At the [facility]: using the NuStep/recumbent Bike For the NuStep, we started at gear 4 and went for 7 minutes.   Initiated HEP:    Exercises Sit to Stand - 2 x daily - 7 x weekly - 2 sets - 5 reps Marching Bridge - 1-2 x daily - 5 x weekly - 2 sets - 5 reps Clamshell with Resistance - 1-2 x daily - 5 x weekly - 2 sets - 10 reps - with red t band Standing Marching - 1-2 x daily - 5 x weekly - 3 sets - with red tband, pulsing into band for 3 seconds at full ROM Side Stepping with Resistance at Thighs - 1-2 x daily - 5 x weekly - 3 sets - with red tband, holding mini squat position, cues for proper technique      PT Education - 06/02/20 0756    Education Details initial HEP    Person(s) Educated Patient    Methods Explanation;Demonstration;Handout    Comprehension Verbalized understanding;Returned demonstration            PT Short Term Goals - 05/29/20 1245      PT SHORT TERM GOAL #1   Title Pt will be independent with initial HEP for strengthening and walking program.    Time 3    Period Weeks    Status New    Target Date 06/19/20      PT SHORT TERM GOAL #2   Title Pt will increase 30 sec sit to stand from 11 reps to 13 or more for improved functional strength and endurance.    Baseline 05/29/20 11 reps from chair without hands    Time 3    Period Weeks    Status New    Target Date 06/19/20      PT SHORT TERM GOAL #3   Title Pt will ambulate >1000' on varied surfaces independently for improved gait in community.    Time 3    Period Weeks    Status New    Target Date 06/19/20             PT Long Term Goals - 05/29/20 1247      PT LONG TERM GOAL #1   Title Pt will be independent with progressive HEP for strengthening, balance and aerobic exercises to be able to return to her dance classes.    Time 6    Period Weeks     Status New    Target Date 07/10/20      PT LONG TERM GOAL #  2   Title Pt will be able to ambulate up/down 12 steps without railing in reciprocal pattern for improved functional strength.    Time 6    Period Weeks    Status New    Target Date 07/10/20      PT LONG TERM GOAL #3   Title Pt will increase FGA from 21/30 to >25/30 for improved balance and gait safety.    Baseline 05/29/20 21/30    Time 6    Period Weeks    Status New    Target Date 07/10/20      PT LONG TERM GOAL #4   Title Pt will increase 6 min walk from 960' to >1200' with <4/10 RPE for improved activity tolerance.    Baseline 05/29/20 960' with 6/10 RPE    Time 6    Period Weeks    Status New    Target Date 07/10/20                 Plan - 06/02/20 0758    Clinical Impression Statement Focus of today's skilled session was BLE strengthening and initiating HEP. Reviewed pt's walking program given at eval and discussed use of the NuStep/recumbent bike at the [facility] (pt reporting she is going next week). Pt's HR and O2 remained WFL throughout today's session with activity. No reports of L leg discomfort throughout. Will continue to progress towards LTGs.    Personal Factors and Comorbidities Comorbidity 3+    Comorbidities DM-2, HLD, hypothyroidism, PE-s/p IVC filter in 2012, history of colon cancer    Examination-Activity Limitations Locomotion Level;Stairs    Examination-Participation Restrictions Community Activity;Yard Work;Cleaning    Stability/Clinical Decision Making Stable/Uncomplicated    Rehab Potential Good    PT Frequency 2x / week   followed by 1x/week for 3 weeks   PT Duration 3 weeks    PT Treatment/Interventions ADLs/Self Care Home Management;Gait training;Stair training;Functional mobility training;Therapeutic activities;Therapeutic exercise;Patient/family education;Neuromuscular re-education;Balance training;Manual techniques;Vestibular    PT Next Visit Plan How is she doing with walking more at  home? how was HEP? Work on walking for improving activity tolerance, Insurance risk surveyor or NuStep for aerobic activity. BLE strengthening.    PT Home Exercise Plan [email or web address]    Consulted and Agree with Plan of Care Patient           Patient will benefit from skilled therapeutic intervention in order to improve the following deficits and impairments:  Abnormal gait, Decreased activity tolerance, Decreased balance, Decreased endurance, Decreased mobility, Decreased strength, Difficulty walking, Pain  Visit Diagnosis: Other abnormalities of gait and mobility  Difficulty in walking, not elsewhere classified  Muscle weakness (generalized)     Problem List Patient Active Problem List   Diagnosis Date Noted  . Sepsis due to COVID-19 ([place]) 03/22/2020  . Type 2 diabetes mellitus without complication, without long-term current use of insulin ([place]) 12/03/2018  . Hypothyroidism 07/08/2018  . Bilateral calf pain 06/08/2015  . Chest pain 08/22/2014  . Hyperlipidemia 07/13/2014  . Family history of hypertrophic cardiomyopathy 07/13/2014  . DOE (dyspnea on exertion) 07/13/2014  . Diabetes ([place]) 11/01/2013  . Adiposity 11/01/2013  . Bilateral pulmonary embolism ([place]) 11/01/2013  . Obesity 11/01/2013  . Diabetes mellitus   . Malignant neoplasm of rectum ([place]) 08/29/2011    Arliss Journey, PT, DPT  06/02/2020, 7:59 AM  [facility] [address] [place], [name], [address] Phone: [phone]   Fax:  [phone]  Name: [name] MRN:  [id number] Date of Birth: 1965-12-02

## 2020-06-06 ENCOUNTER — Ambulatory Visit: Payer: No Typology Code available for payment source

## 2020-06-06 ENCOUNTER — Other Ambulatory Visit: Payer: Self-pay

## 2020-06-06 DIAGNOSIS — R262 Difficulty in walking, not elsewhere classified: Secondary | ICD-10-CM

## 2020-06-06 DIAGNOSIS — R2689 Other abnormalities of gait and mobility: Secondary | ICD-10-CM

## 2020-06-06 DIAGNOSIS — M6281 Muscle weakness (generalized): Secondary | ICD-10-CM

## 2020-06-06 NOTE — Therapy (Signed)
[facility] [address] [place], [name], [address] Phone: [phone]   Fax:  [phone]  Physical Therapy Treatment  Patient Details  Name: [name] MRN: [id number] Date of Birth: Jul 22, 1966 Referring Provider (PT): Dr. Ranell Patrick   Encounter Date: 06/06/2020   PT End of Session - 06/06/20 1708    Visit Number 3    Number of Visits 10    PT Start Time 1701    PT Stop Time 7253    PT Time Calculation (min) 43 min    Activity Tolerance Patient tolerated treatment well    Behavior During Therapy [facility] for tasks assessed/performed           Past Medical History:  Diagnosis Date  . 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  ECC with HPV effect  . 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 Diabetes mellitus without complication ([place])   . Headache(784.0)    experiences migraine every couple of weeks. takes only excedrin  . 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  colposcopy adequate normal 10/2015   . Hyperlipidemia   . Pulmonary embolism ([place])   . STD (sexually transmitted disease) 1991   POS GC-TREATED  . Thyroid disease     Past Surgical History:  Procedure Laterality Date  . AORTA - BILATERAL FEMORAL ARTERY BYPASS GRAFT N/A 03/29/2020   Procedure: VENO VENOUS BYPASS;  Surgeon: Waynetta Sandy, MD;  Location: [facility];  Service: Vascular;  Laterality: N/A;  . APPLICATION OF [place] N/A 03/29/2020   Procedure: [facility] MECHANICAL THROMBECTOMY;  Surgeon: Waynetta Sandy, MD;  Location: [facility];  Service: Vascular;  Laterality: N/A;  . CESAREAN SECTION  2005  . COLON SURGERY    . ENDOVASCULAR STENT INSERTION N/A 03/29/2020   Procedure: Inferior Vena Cava WallStent Graft Insertion;  Surgeon: Waynetta Sandy, MD;  Location: [facility];  Service: Vascular;   Laterality: N/A;  . INSERTION OF VENA CAVA FILTER Right 2012  . none    . PORT-A-CATH REMOVAL Right 12/22/2012   Procedure: REMOVAL PORT-A-CATH;  Surgeon: Odis Hollingshead, MD;  Location: [facility];  Service: General;  Laterality: Right;  . PORTACATH PLACEMENT  02/21/11   tip in mid svc-rosenbower  . 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 THROMBECTOMY ILIAC ARTERY N/A 03/29/2020   Procedure: THROMBECTOMY ILIAC ARTERY MECHANICAL THROMBECTOMY;  Surgeon: Waynetta Sandy, MD;  Location: [facility];  Service: Vascular;  Laterality: N/A;    There were no vitals filed for this visit.   Subjective Assessment - 06/06/20 1705    Subjective Patient reports that she went to sons open house and had walked around for an hour and going up/down stairs, patient reports general fatigue and weakness. Has not started back up at the [facility]. Patient reports started back up work yesterday and is going well.    Pertinent History PMHx of DM-2, HLD, hypothyroidism, PE-s/p IVC filter in 2012, history of colon cancer- 2012    Patient Stated Goals Pt wants to get back to her prior status.    Currently in Pain? No/denies                             [facility] Adult PT Treatment/Exercise - 06/06/20 0001      Transfers  Transfers Sit to Stand;Stand to Sit    Sit to Stand 7: Independent    Stand to Sit 7: Independent    Comments completed sit <> stand training 1 x 10 reps with LLE positioned posteriorly patient with hands on thighs. followed by 1 x 10 reps with RLE positioned posterior. increased challenge with LLE positioned posteriorly.       Ambulation/Gait   Ambulation/Gait Yes    Ambulation/Gait Assistance 6: Modified independent (Device/Increase time)    Ambulation Distance (Feet) 600 Feet    Assistive device None    Gait Pattern Step-through pattern    Ambulation Surface Level;Indoor    Gait Comments focused gait training on improved distance prior to requiring rest break.        Exercises   Exercises Knee/Hip      Knee/Hip Exercises: Aerobic   Nustep With BLE/BUE for improved activity tolerance and endurance, completed at gear 4 for 7 minutes. HR prior: 79, HR after completion: 96, O2 stayed between 97-98%.       Knee/Hip Exercises: Standing   Hip Flexion Stengthening;Both;2 sets;10 reps    Hip Flexion Limitations completed alternating marching with red band on thighs.      Hip Abduction Stengthening;Both;2 sets;10 reps;Knee straight    Abduction Limitations completed at countertop with BUE support    Hip Extension Stengthening;Both;2 sets;10 reps;Knee straight;Limitations    Extension Limitations completed at countertop with BUE support    Other Standing Knee Exercises Completed side stepping along countertop x 4 laps, down and back with red band around thighs. Progressed to completing monster walk with red band around thighs x 4 laps down and back. Verbal cues for technique. light UE suppot as needed for form.                     PT Short Term Goals - 05/29/20 1245      PT SHORT TERM GOAL #1   Title Pt will be independent with initial HEP for strengthening and walking program.    Time 3    Period Weeks    Status New    Target Date 06/19/20      PT SHORT TERM GOAL #2   Title Pt will increase 30 sec sit to stand from 11 reps to 13 or more for improved functional strength and endurance.    Baseline 05/29/20 11 reps from chair without hands    Time 3    Period Weeks    Status New    Target Date 06/19/20      PT SHORT TERM GOAL #3   Title Pt will ambulate >1000' on varied surfaces independently for improved gait in community.    Time 3    Period Weeks    Status New    Target Date 06/19/20             PT Long Term Goals - 05/29/20 1247      PT LONG TERM GOAL #1   Title Pt will be independent with progressive HEP for strengthening, balance and aerobic exercises to be able to return to her dance classes.    Time 6    Period Weeks     Status New    Target Date 07/10/20      PT LONG TERM GOAL #2   Title Pt will be able to ambulate up/down 12 steps without railing in reciprocal pattern for improved functional strength.    Time 6    Period Weeks  Status New    Target Date 07/10/20      PT LONG TERM GOAL #3   Title Pt will increase FGA from 21/30 to >25/30 for improved balance and gait safety.    Baseline 05/29/20 21/30    Time 6    Period Weeks    Status New    Target Date 07/10/20      PT LONG TERM GOAL #4   Title Pt will increase 6 min walk from 960' to >1200' with <4/10 RPE for improved activity tolerance.    Baseline 05/29/20 960' with 6/10 RPE    Time 6    Period Weeks    Status New    Target Date 07/10/20                 Plan - 06/06/20 2018    Clinical Impression Statement Today's skilled session included continued BLE strengthening activities included review of some exercises included on HEP as patient has not had chance to complete at home at this time. Continued gait training and endurance training on NuStep to further promote improved activity tolerance. Patient vitals continue to remain [facility] with activities throughout. Will continue to progress.    Personal Factors and Comorbidities Comorbidity 3+    Comorbidities DM-2, HLD, hypothyroidism, PE-s/p IVC filter in 2012, history of colon cancer    Examination-Activity Limitations Locomotion Level;Stairs    Examination-Participation Restrictions Community Activity;Yard Work;Cleaning    Stability/Clinical Decision Making Stable/Uncomplicated    Rehab Potential Good    PT Frequency 2x / week   followed by 1x/week for 3 weeks   PT Duration 3 weeks    PT Treatment/Interventions ADLs/Self Care Home Management;Gait training;Stair training;Functional mobility training;Therapeutic activities;Therapeutic exercise;Patient/family education;Neuromuscular re-education;Balance training;Manual techniques;Vestibular    PT Next Visit Plan how was HEP? Work on  walking for improving activity tolerance, Insurance risk surveyor or NuStep for aerobic activity. BLE strengthening.    PT Home Exercise Plan [email or web address]    Consulted and Agree with Plan of Care Patient           Patient will benefit from skilled therapeutic intervention in order to improve the following deficits and impairments:  Abnormal gait, Decreased activity tolerance, Decreased balance, Decreased endurance, Decreased mobility, Decreased strength, Difficulty walking, Pain  Visit Diagnosis: Other abnormalities of gait and mobility  Difficulty in walking, not elsewhere classified  Muscle weakness (generalized)     Problem List Patient Active Problem List   Diagnosis Date Noted  . Sepsis due to COVID-19 ([place]) 03/22/2020  . Type 2 diabetes mellitus without complication, without long-term current use of insulin ([place]) 12/03/2018  . Hypothyroidism 07/08/2018  . Bilateral calf pain 06/08/2015  . Chest pain 08/22/2014  . Hyperlipidemia 07/13/2014  . Family history of hypertrophic cardiomyopathy 07/13/2014  . DOE (dyspnea on exertion) 07/13/2014  . Diabetes ([place]) 11/01/2013  . Adiposity 11/01/2013  . Bilateral pulmonary embolism ([place]) 11/01/2013  . Obesity 11/01/2013  . Diabetes mellitus   . Malignant neoplasm of rectum ([place]) 08/29/2011    Jones Bales, PT, DPT 06/06/2020, 8:26 PM  [facility] [address] [place], [name], [address] Phone: [phone]   Fax:  [phone]  Name: [name] MRN: [id number] Date of Birth: 1966/01/14

## 2020-06-08 ENCOUNTER — Ambulatory Visit: Payer: No Typology Code available for payment source

## 2020-06-08 ENCOUNTER — Other Ambulatory Visit: Payer: Self-pay

## 2020-06-08 DIAGNOSIS — M6281 Muscle weakness (generalized): Secondary | ICD-10-CM

## 2020-06-08 DIAGNOSIS — R2689 Other abnormalities of gait and mobility: Secondary | ICD-10-CM

## 2020-06-08 DIAGNOSIS — R262 Difficulty in walking, not elsewhere classified: Secondary | ICD-10-CM

## 2020-06-08 NOTE — Therapy (Signed)
[facility] [address] [place], [name], [address] Phone: [phone]   Fax:  [phone]  Physical Therapy Treatment  Patient Details  Name: [name] MRN: [id number] Date of Birth: [birth year] Referring Provider (PT): Dr. Ranell Patrick   Encounter Date: 06/08/2020   PT End of Session - 06/08/20 1704    Visit Number 4    Number of Visits 10    PT Start Time 1701    PT Stop Time 7408    PT Time Calculation (min) 44 min    Activity Tolerance Patient tolerated treatment well    Behavior During Therapy [facility] for tasks assessed/performed           Past Medical History:  Diagnosis Date  . 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  ECC with HPV effect  . 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 Diabetes mellitus without complication ([place])   . Headache(784.0)    experiences migraine every couple of weeks. takes only excedrin  . 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  colposcopy adequate normal 10/2015   . Hyperlipidemia   . Pulmonary embolism ([place])   . STD (sexually transmitted disease) 1991   POS GC-TREATED  . Thyroid disease     Past Surgical History:  Procedure Laterality Date  . AORTA - BILATERAL FEMORAL ARTERY BYPASS GRAFT N/A 03/29/2020   Procedure: VENO VENOUS BYPASS;  Surgeon: Waynetta Sandy, MD;  Location: [place];  Service: Vascular;  Laterality: N/A;  . APPLICATION OF [place] N/A 03/29/2020   Procedure: [facility] MECHANICAL THROMBECTOMY;  Surgeon: Waynetta Sandy, MD;  Location: [facility];  Service: Vascular;  Laterality: N/A;  . CESAREAN SECTION  2005  . COLON SURGERY    . ENDOVASCULAR STENT INSERTION N/A 03/29/2020   Procedure: Inferior Vena Cava WallStent Graft Insertion;  Surgeon: Waynetta Sandy, MD;  Location: [facility];  Service: Vascular;   Laterality: N/A;  . INSERTION OF VENA CAVA FILTER Right 2012  . none    . PORT-A-CATH REMOVAL Right 12/22/2012   Procedure: REMOVAL PORT-A-CATH;  Surgeon: Odis Hollingshead, MD;  Location: [place];  Service: General;  Laterality: Right;  . PORTACATH PLACEMENT  02/21/11   tip in mid svc-rosenbower  . 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 THROMBECTOMY ILIAC ARTERY N/A 03/29/2020   Procedure: THROMBECTOMY ILIAC ARTERY MECHANICAL THROMBECTOMY;  Surgeon: Waynetta Sandy, MD;  Location: [place];  Service: Vascular;  Laterality: N/A;    There were no vitals filed for this visit.   Subjective Assessment - 06/08/20 1705    Subjective Patient reports no new changes/complaints since last visit. Soreness has gradually went away. Reports exercises are going well, has not been able to do them all.    Pertinent History PMHx of DM-2, HLD, hypothyroidism, PE-s/p IVC filter in 2012, history of colon cancer- 2012    Patient Stated Goals Pt wants to get back to her prior status.    Currently in Pain? No/denies                             [facility] Adult PT Treatment/Exercise - 06/08/20 0001      Transfers   Transfers Sit to Stand;Stand to Sit    Sit to Stand 7: Independent    Stand  to Sit 7: Independent    Comments x 5 reps throughout session      Ambulation/Gait   Ambulation/Gait Yes    Ambulation/Gait Assistance 6: Modified independent (Device/Increase time)    Assistive device None    Gait Pattern Step-through pattern    Ambulation Surface Level;Indoor    Gait Comments throughout therapy gym with activities      Exercises   Exercises Knee/Hip      Knee/Hip Exercises: Standing   Lateral Step Up Both;2 sets;10 reps;Step Height: 6";Limitations    Lateral Step Up Limitations completed in // bars without UE support    Forward Step Up Both;2 sets;10 reps;Hand Hold: 0;Step Height: 6";Limitations    Forward Step Up Limitations completed in // bars without UE  support with opposite knee drive. patient demo good posture with completion      Knee/Hip Exercises: Supine   Bridges Limitations completed with ball between knees with 5 second squeeze. verbal cues for form. educated on proper completion at home    Bridges with Greig Right Strengthening;Both;1 set;10 reps;Limitations    Other Supine Knee/Hip Exercises completed isometric hamstring curl x 10 reps with 5 second hold into therapy ball. completed alternating trunk rotations 1 x 15 reps to bilateral direction. verbal cues for slowed pace and improved core control with completion.                Balance Exercises - 06/08/20 0001      Balance Exercises: Standing   Balance Beam Standing across balance beam worked on holding static stance with eyes open x 1 minute, progresed to completing with eyes closed, 3 x 20-30 seconds. Completed alternating toe taps to pebbles forward 1 x 15 reps biaterally without UE support. Progressed to completing crossover x 15 reps bilaterally. one instance of unsteadiness noted.              PT Education - 06/08/20 1748    Education Details HEP Update    Person(s) Educated Patient    Methods Explanation;Demonstration;Handout    Comprehension Verbalized understanding;Returned demonstration            PT Short Term Goals - 05/29/20 1245      PT SHORT TERM GOAL #1   Title Pt will be independent with initial HEP for strengthening and walking program.    Time 3    Period Weeks    Status New    Target Date 06/19/20      PT SHORT TERM GOAL #2   Title Pt will increase 30 sec sit to stand from 11 reps to 13 or more for improved functional strength and endurance.    Baseline 05/29/20 11 reps from chair without hands    Time 3    Period Weeks    Status New    Target Date 06/19/20      PT SHORT TERM GOAL #3   Title Pt will ambulate >1000' on varied surfaces independently for improved gait in community.    Time 3    Period Weeks    Status New    Target  Date 06/19/20             PT Long Term Goals - 05/29/20 1247      PT LONG TERM GOAL #1   Title Pt will be independent with progressive HEP for strengthening, balance and aerobic exercises to be able to return to her dance classes.    Time 6    Period Weeks    Status  New    Target Date 07/10/20      PT LONG TERM GOAL #2   Title Pt will be able to ambulate up/down 12 steps without railing in reciprocal pattern for improved functional strength.    Time 6    Period Weeks    Status New    Target Date 07/10/20      PT LONG TERM GOAL #3   Title Pt will increase FGA from 21/30 to >25/30 for improved balance and gait safety.    Baseline 05/29/20 21/30    Time 6    Period Weeks    Status New    Target Date 07/10/20      PT LONG TERM GOAL #4   Title Pt will increase 6 min walk from 960' to >1200' with <4/10 RPE for improved activity tolerance.    Baseline 05/29/20 960' with 6/10 RPE    Time 6    Period Weeks    Status New    Target Date 07/10/20                 Plan - 06/08/20 1751    Clinical Impression Statement Today's skilled session included continued BLE strengthening with patient tolerating well. Updated HEP to include bridge with ball squeeze. Completed balance activites on balance beam with patient tolerating well, difficulty with SLS on LLE due to weakness however. will continue to progress toward all goals.    Personal Factors and Comorbidities Comorbidity 3+    Comorbidities DM-2, HLD, hypothyroidism, PE-s/p IVC filter in 2012, history of colon cancer    Examination-Activity Limitations Locomotion Level;Stairs    Examination-Participation Restrictions Community Activity;Yard Work;Cleaning    Stability/Clinical Decision Making Stable/Uncomplicated    Rehab Potential Good    PT Frequency 2x / week   followed by 1x/week for 3 weeks   PT Duration 3 weeks    PT Treatment/Interventions ADLs/Self Care Home Management;Gait training;Stair training;Functional mobility  training;Therapeutic activities;Therapeutic exercise;Patient/family education;Neuromuscular re-education;Balance training;Manual techniques;Vestibular    PT Next Visit Plan Work on walking for improving activity tolerance, Sci Fit or NuStep for aerobic activity. BLE strengthening.    PT Home Exercise Plan [email or web address]    Consulted and Agree with Plan of Care Patient           Patient will benefit from skilled therapeutic intervention in order to improve the following deficits and impairments:  Abnormal gait, Decreased activity tolerance, Decreased balance, Decreased endurance, Decreased mobility, Decreased strength, Difficulty walking, Pain  Visit Diagnosis: Other abnormalities of gait and mobility  Difficulty in walking, not elsewhere classified  Muscle weakness (generalized)     Problem List Patient Active Problem List   Diagnosis Date Noted  . Sepsis due to COVID-19 ([place]) 03/22/2020  . Type 2 diabetes mellitus without complication, without long-term current use of insulin ([place]) 12/03/2018  . Hypothyroidism 07/08/2018  . Bilateral calf pain 06/08/2015  . Chest pain 08/22/2014  . Hyperlipidemia 07/13/2014  . Family history of hypertrophic cardiomyopathy 07/13/2014  . DOE (dyspnea on exertion) 07/13/2014  . Diabetes ([place]) 11/01/2013  . Adiposity 11/01/2013  . Bilateral pulmonary embolism ([place]) 11/01/2013  . Obesity 11/01/2013  . Diabetes mellitus   . Malignant neoplasm of rectum ([place]) 08/29/2011    Jones Bales, PT, DPT 06/08/2020, 5:55 PM  [facility] [address] [place], [name], [address] Phone: [phone]   Fax:  [phone]  Name: ACCALIA RIGDON MRN: [id number] Date of Birth: 02-19-1966

## 2020-06-08 NOTE — Patient Instructions (Signed)
Access Code: [email or web address] URL: [email or web address] Date: 06/08/2020 Prepared by: Baldomero Lamy  Program Notes Daily walking beginning at 4-5 min 2x/day  At the [facility]: using the NuStep/recumbent Bike For the NuStep, we started at gear 4 and went for 7 minutes.    Exercises Sit to Stand - 2 x daily - 7 x weekly - 2 sets - 5 reps Marching Bridge - 1-2 x daily - 5 x weekly - 2 sets - 5 reps Clamshell with Resistance - 1-2 x daily - 5 x weekly - 2 sets - 10 reps Standing Marching - 1-2 x daily - 5 x weekly - 3 sets Side Stepping with Resistance at Thighs - 1-2 x daily - 5 x weekly - 3 sets Supine Bridge with Mini Swiss Ball Between Knees - 1 x daily - 5 x weekly - 3 sets - 10 reps

## 2020-06-09 ENCOUNTER — Encounter
Payer: No Typology Code available for payment source | Attending: Physical Medicine and Rehabilitation | Admitting: Physical Medicine and Rehabilitation

## 2020-06-09 ENCOUNTER — Encounter: Payer: Self-pay | Admitting: Physical Medicine and Rehabilitation

## 2020-06-09 ENCOUNTER — Other Ambulatory Visit: Payer: Self-pay

## 2020-06-09 VITALS — BP 112/76 | HR 64 | Temp 98.4°F | Ht 71.0 in | Wt 191.0 lb

## 2020-06-09 DIAGNOSIS — R5381 Other malaise: Secondary | ICD-10-CM | POA: Insufficient documentation

## 2020-06-09 DIAGNOSIS — I2699 Other pulmonary embolism without acute cor pulmonale: Secondary | ICD-10-CM | POA: Insufficient documentation

## 2020-06-09 DIAGNOSIS — A4189 Other specified sepsis: Secondary | ICD-10-CM | POA: Insufficient documentation

## 2020-06-09 DIAGNOSIS — U071 COVID-19: Secondary | ICD-10-CM | POA: Diagnosis present

## 2020-06-09 NOTE — Progress Notes (Signed)
Subjective:    Patient ID: [name], female    DOB: 13-Oct-1966, 54 y.o.   MRN: [id number]  HPI  [name] is a 60 year old woman who presents for follow-up of COVID-19 sepsis. She had post-COVID bilateral pulmonary emboli. She was in the hospital for 23 days and was severely debilitated upon her return to home. She has been improving but was advised by her PCP to follow up with PM&R regarding her debility.  She would like to participate in both outpatient PT and OT. She currently has slow ambulation, difficulty with balance, muscle cramps predominantly in her left leg, impaired endurance, fatigue, brain fog, impaired initiation and motivation. She develops shortness of breath after walking for a few minutes.   She works for [employer] in [employer]. Due to her deficits, she is out of work until August 16th.  8/20:  Therapy has been going very well! She is doing two times per week for three weeks and then one time per week after that. She has been improving a lot and is having an easier time doing stairs. No complaints or concerns at this time.   She started work on Monday and she has noticed brain fog during her work. Her supervisor has been accommodating and flexible. She is mostly sitting at work. She would benefit from a standing desk.    Pain Inventory Average Pain 2 Pain Right Now 0 My pain is intermittent, tingling and aching  In the last 24 hours, has pain interfered with the following? General activity 2 Relation with others 0 Enjoyment of life 2 What TIME of day is your pain at its worst? night and morning Sleep (in general) Good  Pain is worse with: walking, inactivity and standing Pain improves with: rest and therapy/exercise Relief from Meds: 8     Family History  Problem Relation Age of Onset  . Heart disease Father   . Lung cancer Mother   . Brain cancer Mother   . Diabetes Mother   . Hypertension Mother   . Diabetes Brother   . Kidney  disease Brother   . Cervical cancer Paternal Grandmother   . Breast cancer Neg Hx    Social History   Socioeconomic History  . Marital status: Married    Spouse name: Not on file  . Number of children: 1  . Years of education: Not on file  . Highest education level: Not on file  Occupational History  . Occupation: Engineer, structural  Tobacco Use  . Smoking status: Never Smoker  . Smokeless tobacco: Never Used  Substance and Sexual Activity  . Alcohol use: Yes    Comment: rare- twice a year  . Drug use: No  . Sexual activity: Not Currently    Comment: no periods due to radiation  Other Topics Concern  . Not on file  Social History Narrative  . Not on file   Social Determinants of Health   Financial Resource Strain:   . Difficulty of Paying Living Expenses: Not on file  Food Insecurity:   . Worried About Charity fundraiser in the Last Year: Not on file  . Ran Out of Food in the Last Year: Not on file  Transportation Needs:   . Lack of Transportation (Medical): Not on file  . Lack of Transportation (Non-Medical): Not on file  Physical Activity:   . Days of Exercise per Week: Not on file  . Minutes of Exercise per Session: Not on file  Stress:   . Feeling of Stress : Not on file  Social Connections:   . Frequency of Communication with Friends and Family: Not on file  . Frequency of Social Gatherings with Friends and Family: Not on file  . Attends Religious Services: Not on file  . Active Member of Clubs or Organizations: Not on file  . Attends Archivist Meetings: Not on file  . Marital Status: Not on file   Past Surgical History:  Procedure Laterality Date  . AORTA - BILATERAL FEMORAL ARTERY BYPASS GRAFT N/A 03/29/2020   Procedure: VENO VENOUS BYPASS;  Surgeon: Waynetta Sandy, MD;  Location: [facility];  Service: Vascular;  Laterality: N/A;  . APPLICATION OF [place] N/A 03/29/2020   Procedure: [facility] MECHANICAL THROMBECTOMY;  Surgeon: Waynetta Sandy, MD;  Location: [facility];  Service: Vascular;  Laterality: N/A;  . CESAREAN SECTION  2005  . COLON SURGERY    . ENDOVASCULAR STENT INSERTION N/A 03/29/2020   Procedure: Inferior Vena Cava WallStent Graft Insertion;  Surgeon: Waynetta Sandy, MD;  Location: [facility];  Service: Vascular;  Laterality: N/A;  . INSERTION OF VENA CAVA FILTER Right 2012  . none    . PORT-A-CATH REMOVAL Right 12/22/2012   Procedure: REMOVAL PORT-A-CATH;  Surgeon: Odis Hollingshead, MD;  Location: [facility];  Service: General;  Laterality: Right;  . PORTACATH PLACEMENT  02/21/11   tip in mid svc-rosenbower  . 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 THROMBECTOMY ILIAC ARTERY N/A 03/29/2020   Procedure: THROMBECTOMY ILIAC ARTERY MECHANICAL THROMBECTOMY;  Surgeon: Waynetta Sandy, MD;  Location: [facility];  Service: Vascular;  Laterality: N/A;   Past Medical History:  Diagnosis Date  . 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  ECC with HPV effect  . 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 Diabetes mellitus without complication ([place])   . Headache(784.0)    experiences migraine every couple of weeks. takes only excedrin  . 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  colposcopy adequate normal 10/2015   . Hyperlipidemia   . Pulmonary embolism ([place])   . STD (sexually transmitted disease) 1991   POS GC-TREATED  . Thyroid disease    LMP 09/12/2011   Opioid Risk Score:   Fall Risk Score:  `1  Depression screen PHQ 2/9  Depression screen [facility] 2/9 05/05/2020 03/14/2020 12/03/2018  Decreased Interest 0 0 0  Down, Depressed, Hopeless 0 0 0  PHQ - 2 Score 0 0 0  Altered sleeping 2 - -  Tired, decreased energy 2 - -  Change in appetite 0 - -  Feeling bad or failure about yourself  0 - -  Trouble concentrating 1 - -  Moving slowly or fidgety/restless 2  - -  Suicidal thoughts 0 - -  PHQ-9 Score 7 - -  Some recent data might be hidden   Review of Systems  Constitutional: Negative.   HENT: Negative.   Eyes: Negative.   Respiratory: Negative.   Cardiovascular: Negative.   Gastrointestinal: Positive for diarrhea.  Endocrine:       High blood sugars  Genitourinary: Positive for urgency.  Musculoskeletal: Positive for gait problem.       Leg weakness  Skin: Positive for wound.       Groin from IVC removal  Allergic/Immunologic: Negative.   Neurological: Positive  for weakness.  Hematological: Bruises/bleeds easily.       Eliquis  Psychiatric/Behavioral: Negative.   All other systems reviewed and are negative.      Objective:   Physical Exam Gen: no distress, normal appearing HEENT: oral mucosa pink and moist, NCAT Cardio: Reg rate Chest: normal effort, normal rate of breathing Abd: soft, non-distended Ext: no edema Skin: Well healing incision sites on anterior chest wall.  Neuro: 5/5 strength in upper extremities. 4+/5 strength in proximal lower extremities, and 4-/5 strength in distal. Slow gait up and down hallway without assistive device.  Psych: pleasant, normal affect    Assessment & Plan:  Mrs. Dain is a 54 year old woman who presents to establish care following [id number] sepsis complicated by bilateral pulmonary emboli, resulting in debility. Outpatient internal medicine and inpatient hospital notes reviewed.   -Continue outpatient PT and OT to address deficits in strength, endurance, balance, ability to climb stairs and to perform ADLS such as cooking and laundry.  -Has returned to work. Provided work note recommending standing desk be provided.   -Examined anterior chest wall angiovac incisions. Recommend coconut oil application to promote wound healing.   -Discussed SLP for brain fog, will defer at this time. She is receiving appropriate work Music therapist  -IT sales professional rehab in future if she  maintains with impaired endurance after 6-8 weeks of PT and OT.   -Discussed nerve regeneration, expected symptoms from [place]  All questions answered. RTC PRN

## 2020-06-12 ENCOUNTER — Ambulatory Visit: Payer: No Typology Code available for payment source

## 2020-06-15 ENCOUNTER — Other Ambulatory Visit: Payer: Self-pay

## 2020-06-15 ENCOUNTER — Ambulatory Visit: Payer: No Typology Code available for payment source

## 2020-06-15 DIAGNOSIS — R262 Difficulty in walking, not elsewhere classified: Secondary | ICD-10-CM

## 2020-06-15 DIAGNOSIS — R2689 Other abnormalities of gait and mobility: Secondary | ICD-10-CM

## 2020-06-15 DIAGNOSIS — M6281 Muscle weakness (generalized): Secondary | ICD-10-CM

## 2020-06-15 NOTE — Therapy (Signed)
[facility] [address] [place], [name], [address] Phone: [phone]   Fax:  [phone]  Physical Therapy Treatment  Patient Details  Name: [name] MRN: [id number] Date of Birth: [birth year] Referring Provider (PT): Dr. Ranell Patrick   Encounter Date: 06/15/2020   PT End of Session - 06/15/20 1750    Visit Number 5    Number of Visits [place]    PT Start Time 5035    PT Stop Time 1829    PT Time Calculation (min) 42 min    Activity Tolerance Patient tolerated treatment well    Behavior During Therapy [facility] for tasks assessed/performed           Past Medical History:  Diagnosis Date  . 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  ECC with HPV effect  . 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 Diabetes mellitus without complication ([place])   . Headache(784.0)    experiences migraine every couple of weeks. takes only excedrin  . 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  colposcopy adequate normal 10/2015   . Hyperlipidemia   . Pulmonary embolism ([place])   . STD (sexually transmitted disease) 1991   POS GC-TREATED  . Thyroid disease     Past Surgical History:  Procedure Laterality Date  . AORTA - BILATERAL FEMORAL ARTERY BYPASS GRAFT N/A 03/29/2020   Procedure: VENO VENOUS BYPASS;  Surgeon: Waynetta Sandy, MD;  Location: [place];  Service: Vascular;  Laterality: N/A;  . APPLICATION OF [place] N/A 03/29/2020   Procedure: [facility] MECHANICAL THROMBECTOMY;  Surgeon: Waynetta Sandy, MD;  Location: [place];  Service: Vascular;  Laterality: N/A;  . CESAREAN SECTION  2005  . COLON SURGERY    . ENDOVASCULAR STENT INSERTION N/A 03/29/2020   Procedure: Inferior Vena Cava WallStent Graft Insertion;  Surgeon: Waynetta Sandy, MD;   Location: [place];  Service: Vascular;  Laterality: N/A;  . INSERTION OF VENA CAVA FILTER Right 2012  . none    . PORT-A-CATH REMOVAL Right 12/22/2012   Procedure: REMOVAL PORT-A-CATH;  Surgeon: Odis Hollingshead, MD;  Location: [facility] [place];  Service: General;  Laterality: Right;  . PORTACATH PLACEMENT  02/21/11   tip in mid svc-rosenbower  . 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 THROMBECTOMY ILIAC ARTERY N/A 03/29/2020   Procedure: THROMBECTOMY ILIAC ARTERY MECHANICAL THROMBECTOMY;  Surgeon: Waynetta Sandy, MD;  Location: [place];  Service: Vascular;  Laterality: N/A;    There were no vitals filed for this visit.   Subjective Assessment - 06/15/20 1751    Subjective No new changes/complaints since last visit. Did have some soreness after walking fast up steps to get son at school. Feels like her legs are getting stronger.    Pertinent History PMHx of DM-2, HLD, hypothyroidism, PE-s/p IVC filter in 2012, history of colon cancer- 2012    Patient Stated Goals Pt wants to get back to her prior status.    Currently in Pain? No/denies                             [facility] Adult PT Treatment/Exercise - 06/15/20 0001      Transfers   Transfers Sit to Stand;Stand to Sit    Sit  to Stand 7: Independent    Stand to Sit 7: Independent    Comments Completed 30 sec sit <> stand test able to complete 13 reps w/o UE support from standard height chair.       Ambulation/Gait   Ambulation/Gait Yes    Ambulation/Gait Assistance 7: Independent    Ambulation/Gait Assistance Details Completed gait training on outdoor surfaces including pavement. Did not complete gait training across grass surfaces due to raining earlier in day and still being safety concern with being wet. Patient reports no fatigue in BLE or shortness of breath with ambulation.     Ambulation Distance (Feet) 1200 Feet    Assistive device None    Gait Pattern Step-through pattern    Ambulation Surface  Level;Indoor;Unlevel;Outdoor;Paved      Neuro Re-ed    Neuro Re-ed Details  Patient reports attempting toe taps at home: educated patient on ways to complete toe taps to bottom shelf of cabinet and standing on complaint surface. Completed on pillow x 15 reps without UE support. Educated on proper ocmpletion and added to HEP.       Exercises   Exercises Other Exercises    Other Exercises  Reviewed current HEP to ensure compliance and patient feeling comfortable with all HEP additions up until this point.                Balance Exercises - 06/15/20 0001      Balance Exercises: Standing   Rockerboard Anterior/posterior;Lateral;EO;EC;Limitations    Rockerboard Limitations With rockerboard positoined ant/post: completed EC 3 x 30 seconds without support, progressed to completing anterior/posterior weight shift with focus on control x 10 reps. With board positioned laterally, completed eyes closed 3 x 30 seconds, increased balance challenge noted. Completed lateral weight shifts x 10 reps to each direction.     Other Standing Exercises on rockerboard positioned ant/post: completed alternating toe taps to colored pebbles with focus on holding board steady x 15 reps. one instance of unsteadiness noted requiring CGA from PT. completed without UE support.              PT Education - 06/15/20 [place]    Education Details HEP Update (see patient instructions)    Person(s) Educated Patient    Methods Demonstration;Explanation;Handout    Comprehension Verbalized understanding;Returned demonstration            PT Short Term Goals - 06/15/20 1754      PT SHORT TERM GOAL #1   Title Pt will be independent with initial HEP for strengthening and walking program.    Baseline Reports independence with HEP; not started walking program    Time 3    Period Weeks    Status Partially Met    Target Date 06/19/20      PT SHORT TERM GOAL #2   Title Pt will increase 30 sec sit to stand from 11 reps to  13 or more for improved functional strength and endurance.    Baseline 05/29/20 11 reps from chair without hands, completed 13 reps w/o UE support    Time 3    Period Weeks    Status Achieved    Target Date 06/19/20      PT SHORT TERM GOAL #3   Title Pt will ambulate >1000' on varied surfaces independently for improved gait in community.    Baseline ambulated 1200' on varied surfaces (indoor/outdoor)    Time 3    Period Weeks    Status Achieved    Target  Date 06/19/20             PT Long Term Goals - 05/29/20 1247      PT LONG TERM GOAL #1   Title Pt will be independent with progressive HEP for strengthening, balance and aerobic exercises to be able to return to her dance classes.    Time 6    Period Weeks    Status New    Target Date 07/10/20      PT LONG TERM GOAL #2   Title Pt will be able to ambulate up/down 12 steps without railing in reciprocal pattern for improved functional strength.    Time 6    Period Weeks    Status New    Target Date 07/10/20      PT LONG TERM GOAL #3   Title Pt will increase FGA from 21/30 to >25/30 for improved balance and gait safety.    Baseline 05/29/20 21/30    Time 6    Period Weeks    Status New    Target Date 07/10/20      PT LONG TERM GOAL #4   Title Pt will increase 6 min walk from 960' to >1200' with <4/10 RPE for improved activity tolerance.    Baseline 05/29/20 960' with 6/10 RPE    Time 6    Period Weeks    Status New    Target Date 07/10/20                 Plan - 06/15/20 1929    Clinical Impression Statement Today's skilled PT session included assesment of patients progress toward STG. Patient able to meet STG #2 and #3 today, demonstrating improved endurance and functional strength. Patient partially met LTG #1, due to not completing walking program daily. patient is demo good progress, and will continue to benefit from skilled PT services.    Personal Factors and Comorbidities Comorbidity 3+    Comorbidities  DM-2, HLD, hypothyroidism, PE-s/p IVC filter in 2012, history of colon cancer    Examination-Activity Limitations Locomotion Level;Stairs    Examination-Participation Restrictions Community Activity;Yard Work;Cleaning    Stability/Clinical Decision Making Stable/Uncomplicated    Rehab Potential Good    PT Frequency 2x / week   followed by 1x/week for 3 weeks   PT Duration 3 weeks    PT Treatment/Interventions ADLs/Self Care Home Management;Gait training;Stair training;Functional mobility training;Therapeutic activities;Therapeutic exercise;Patient/family education;Neuromuscular re-education;Balance training;Manual techniques;Vestibular    PT Next Visit Plan BLE strengthening, Dynamic Balance (beam, bosu). SciFit/Nustep for Aerobic.    PT Home Exercise Plan [id number]    Consulted and Agree with Plan of Care Patient           Patient will benefit from skilled therapeutic intervention in order to improve the following deficits and impairments:  Abnormal gait, Decreased activity tolerance, Decreased balance, Decreased endurance, Decreased mobility, Decreased strength, Difficulty walking, Pain  Visit Diagnosis: Other abnormalities of gait and mobility  Difficulty in walking, not elsewhere classified  Muscle weakness (generalized)     Problem List Patient Active Problem List   Diagnosis Date Noted  . Sepsis due to COVID-19 ([place]) 03/22/2020  . Type 2 diabetes mellitus without complication, without long-term current use of insulin ([place]) 12/03/2018  . Hypothyroidism 07/08/2018  . Bilateral calf pain 06/08/2015  . Chest pain 08/22/2014  . Hyperlipidemia 07/13/2014  . Family history of hypertrophic cardiomyopathy 07/13/2014  . DOE (dyspnea on exertion) 07/13/2014  . Diabetes ([place]) 11/01/2013  . Adiposity 11/01/2013  . Bilateral pulmonary  embolism ([place]) 11/01/2013  . Obesity 11/01/2013  . Diabetes mellitus   . Malignant neoplasm of rectum ([place]) 08/29/2011    Jones Bales, PT,  DPT 06/15/2020, 7:33 PM  [facility] [address] [place], [name], [address] Phone: [phone]   Fax:  [phone]  Name: [name] MRN: [id number] Date of Birth: 11-29-1965

## 2020-06-15 NOTE — Patient Instructions (Signed)
Access Code: [email or web address] URL: [email or web address] Date: 06/15/2020 Prepared by: Baldomero Lamy  Program Notes Daily walking beginning at 4-5 min 2x/day  At the [facility]: using the NuStep/recumbent Bike For the NuStep, we started at gear 4 and went for 7 minutes.    Exercises Sit to Stand - 2 x daily - 7 x weekly - 2 sets - 5 reps Marching Bridge - 1-2 x daily - 5 x weekly - 2 sets - 5 reps Clamshell with Resistance - 1-2 x daily - 5 x weekly - 2 sets - 10 reps Standing Marching - 1-2 x daily - 5 x weekly - 3 sets Side Stepping with Resistance at Thighs - 1-2 x daily - 5 x weekly - 3 sets Supine Bridge with Mini Swiss Ball Between Knees - 1 x daily - 5 x weekly - 3 sets - 10 reps Standing Toe Taps - 1 x daily - 7 x weekly - 3 sets - 10 reps

## 2020-06-19 ENCOUNTER — Telehealth: Payer: Self-pay | Admitting: Hematology and Oncology

## 2020-06-19 NOTE — Telephone Encounter (Signed)
Received a new pt referral from [facility] for dvt. [name] has been cld and schedled to see Dr. Lorenso Courier on 9/27 at 2pm. Pt aware to arrive 15 minutes early.

## 2020-06-21 ENCOUNTER — Ambulatory Visit: Payer: No Typology Code available for payment source | Attending: Internal Medicine

## 2020-06-21 ENCOUNTER — Other Ambulatory Visit: Payer: Self-pay

## 2020-06-21 DIAGNOSIS — M6281 Muscle weakness (generalized): Secondary | ICD-10-CM | POA: Diagnosis present

## 2020-06-21 DIAGNOSIS — R2689 Other abnormalities of gait and mobility: Secondary | ICD-10-CM | POA: Diagnosis not present

## 2020-06-21 NOTE — Therapy (Signed)
[facility] [address] [place], [name], [address] Phone: [phone]   Fax:  [phone]  Physical Therapy Treatment  Patient Details  Name: [name] MRN: [id number] Date of Birth: 01-Sep-1966 Referring Provider (PT): Dr. Ranell Patrick   Encounter Date: 06/21/2020   PT End of Session - 06/21/20 1019    Visit Number 6    Number of Visits [place]    PT Start Time 1017    PT Stop Time 1057    PT Time Calculation (min) 40 min    Equipment Utilized During Treatment Gait belt    Activity Tolerance Patient tolerated treatment well    Behavior During Therapy [facility] for tasks assessed/performed           Past Medical History:  Diagnosis Date  . 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  ECC with HPV effect  . 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 Diabetes mellitus without complication ([place])   . Headache(784.0)    experiences migraine every couple of weeks. takes only excedrin  . 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  colposcopy adequate normal 10/2015   . Hyperlipidemia   . Pulmonary embolism ([place])   . STD (sexually transmitted disease) 1991   POS GC-TREATED  . Thyroid disease     Past Surgical History:  Procedure Laterality Date  . AORTA - BILATERAL FEMORAL ARTERY BYPASS GRAFT N/A 03/29/2020   Procedure: VENO VENOUS BYPASS;  Surgeon: Waynetta Sandy, MD;  Location: [place];  Service: Vascular;  Laterality: N/A;  . APPLICATION OF [place] N/A 03/29/2020   Procedure: [facility] MECHANICAL THROMBECTOMY;  Surgeon: Waynetta Sandy, MD;  Location: [place];  Service: Vascular;  Laterality: N/A;  . CESAREAN SECTION  2005  . COLON SURGERY    . ENDOVASCULAR STENT INSERTION N/A 03/29/2020   Procedure: Inferior Vena Cava WallStent Graft  Insertion;  Surgeon: Waynetta Sandy, MD;  Location: [place];  Service: Vascular;  Laterality: N/A;  . INSERTION OF VENA CAVA FILTER Right 2012  . none    . PORT-A-CATH REMOVAL Right 12/22/2012   Procedure: REMOVAL PORT-A-CATH;  Surgeon: Odis Hollingshead, MD;  Location: [place];  Service: General;  Laterality: Right;  . PORTACATH PLACEMENT  02/21/11   tip in mid svc-rosenbower  . 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 THROMBECTOMY ILIAC ARTERY N/A 03/29/2020   Procedure: THROMBECTOMY ILIAC ARTERY MECHANICAL THROMBECTOMY;  Surgeon: Waynetta Sandy, MD;  Location: [place];  Service: Vascular;  Laterality: N/A;    There were no vitals filed for this visit.   Subjective Assessment - 06/21/20 1019    Subjective Pt reports she feels she is getting stronger.    Pertinent History PMHx of DM-2, HLD, hypothyroidism, PE-s/p IVC filter in 2012, history of colon cancer- 2012    Patient Stated Goals Pt wants to get back to her prior status.    Currently in Pain? No/denies                             [facility] Adult PT Treatment/Exercise - 06/21/20 1022      Ambulation/Gait   Ambulation/Gait Yes    Ambulation/Gait Assistance 7: Independent    Ambulation/Gait Assistance Details Pt ambulated for 6 min  30 sec covering 1400'. Reported 1/10 RPE after.    Ambulation Distance (Feet) 1400 Feet    Assistive device None    Gait Pattern Step-through pattern    Ambulation Surface Level;Indoor      Neuro Re-ed    Neuro Re-ed Details  In // bars: standing on rockerboard positioned ant/post trying to maintain level x 30 sec then 30 sec x 2 eyes closed with increased sway. Rocking board anterior/posterior x 10, alternating toe taps on cone standing on board. Step-ups on blue foam beam x 10 each leg. Side stepping down foam beam 6' x 8 then tandem gait on beam 6' x 8 having to touch occasionally. HR=84 after activities. In hallway: marching gait 40' x 2, tandem gait 40'  x 2, side stepping with mini-squat 40' x 2. Pt reported feeling it in her legs after the squats with some tingling.                    PT Short Term Goals - 06/15/20 1754      PT SHORT TERM GOAL #1   Title Pt will be independent with initial HEP for strengthening and walking program.    Baseline Reports independence with HEP; not started walking program    Time 3    Period Weeks    Status Partially Met    Target Date 06/19/20      PT SHORT TERM GOAL #2   Title Pt will increase 30 sec sit to stand from 11 reps to 13 or more for improved functional strength and endurance.    Baseline 05/29/20 11 reps from chair without hands, completed 13 reps w/o UE support    Time 3    Period Weeks    Status Achieved    Target Date 06/19/20      PT SHORT TERM GOAL #3   Title Pt will ambulate >1000' on varied surfaces independently for improved gait in community.    Baseline ambulated 1200' on varied surfaces (indoor/outdoor)    Time 3    Period Weeks    Status Achieved    Target Date 06/19/20             PT Long Term Goals - 05/29/20 1247      PT LONG TERM GOAL #1   Title Pt will be independent with progressive HEP for strengthening, balance and aerobic exercises to be able to return to her dance classes.    Time 6    Period Weeks    Status New    Target Date 07/10/20      PT LONG TERM GOAL #2   Title Pt will be able to ambulate up/down 12 steps without railing in reciprocal pattern for improved functional strength.    Time 6    Period Weeks    Status New    Target Date 07/10/20      PT LONG TERM GOAL #3   Title Pt will increase FGA from 21/30 to >25/30 for improved balance and gait safety.    Baseline 05/29/20 21/30    Time 6    Period Weeks    Status New    Target Date 07/10/20      PT LONG TERM GOAL #4   Title Pt will increase 6 min walk from 960' to >1200' with <4/10 RPE for improved activity tolerance.    Baseline 05/29/20 960' with 6/10 RPE    Time 6    Period  Weeks  Status New    Target Date 07/10/20                 Plan - 06/21/20 1429    Clinical Impression Statement Pt continues to show improvement in activity tolerance and functional strength and balance. Continued to focus on dynamic gait and high level balance activities.    Personal Factors and Comorbidities Comorbidity 3+    Comorbidities DM-2, HLD, hypothyroidism, PE-s/p IVC filter in 2012, history of colon cancer    Examination-Activity Limitations Locomotion Level;Stairs    Examination-Participation Restrictions Community Activity;Yard Work;Cleaning    Stability/Clinical Decision Making Stable/Uncomplicated    Rehab Potential Good    PT Frequency 2x / week   followed by 1x/week for 3 weeks   PT Duration 3 weeks    PT Treatment/Interventions ADLs/Self Care Home Management;Gait training;Stair training;Functional mobility training;Therapeutic activities;Therapeutic exercise;Patient/family education;Neuromuscular re-education;Balance training;Manual techniques;Vestibular    PT Next Visit Plan BLE strengthening, Dynamic Balance (beam, bosu). SciFit/Nustep/treadmill for Aerobic.    PT Home Exercise Plan [email or web address]    Consulted and Agree with Plan of Care Patient           Patient will benefit from skilled therapeutic intervention in order to improve the following deficits and impairments:  Abnormal gait, Decreased activity tolerance, Decreased balance, Decreased endurance, Decreased mobility, Decreased strength, Difficulty walking, Pain  Visit Diagnosis: Other abnormalities of gait and mobility  Muscle weakness (generalized)     Problem List Patient Active Problem List   Diagnosis Date Noted  . Sepsis due to COVID-19 ([place]) 03/22/2020  . Type 2 diabetes mellitus without complication, without long-term current use of insulin ([place]) 12/03/2018  . Hypothyroidism 07/08/2018  . Bilateral calf pain 06/08/2015  . Chest pain 08/22/2014  . Hyperlipidemia 07/13/2014  . Family  history of hypertrophic cardiomyopathy 07/13/2014  . DOE (dyspnea on exertion) 07/13/2014  . Diabetes ([place]) 11/01/2013  . Adiposity 11/01/2013  . Bilateral pulmonary embolism ([place]) 11/01/2013  . Obesity 11/01/2013  . Diabetes mellitus   . Malignant neoplasm of rectum ([place]) 08/29/2011    Electa Sniff, PT, DPT, NCS 06/21/2020, 6:31 PM  [facility] [address] [place], [name], [address] Phone: [phone]   Fax:  [phone]  Name: ALINNA SIPLE MRN: [id number] Date of Birth: Dec 13, 1965

## 2020-06-23 ENCOUNTER — Other Ambulatory Visit: Payer: Self-pay

## 2020-06-23 ENCOUNTER — Ambulatory Visit: Payer: No Typology Code available for payment source

## 2020-06-23 DIAGNOSIS — R2689 Other abnormalities of gait and mobility: Secondary | ICD-10-CM | POA: Diagnosis not present

## 2020-06-23 DIAGNOSIS — M6281 Muscle weakness (generalized): Secondary | ICD-10-CM

## 2020-06-23 NOTE — Therapy (Signed)
[name] [address] [place], [name], [address] Phone: [phone]   Fax:  [phone]  Physical Therapy Treatment  Patient Details  Name: [name] MRN: [id number] Date of Birth: [birth year] Referring Provider (PT): Dr. Ranell Patrick   Encounter Date: 06/23/2020   PT End of Session - 06/23/20 0718    Visit Number 7    Number of Visits [place]    PT Start Time [phone]    PT Stop Time 0755    PT Time Calculation (min) 39 min    Equipment Utilized During Treatment --    Activity Tolerance Patient tolerated treatment well    Behavior During Therapy [facility] for tasks assessed/performed           Past Medical History:  Diagnosis Date  . 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  ECC with HPV effect  . 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 Diabetes mellitus without complication ([place])   . Headache(784.0)    experiences migraine every couple of weeks. takes only excedrin  . 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  colposcopy adequate normal 10/2015   . Hyperlipidemia   . Pulmonary embolism ([place])   . STD (sexually transmitted disease) 1991   POS GC-TREATED  . Thyroid disease     Past Surgical History:  Procedure Laterality Date  . AORTA - BILATERAL FEMORAL ARTERY BYPASS GRAFT N/A 03/29/2020   Procedure: VENO VENOUS BYPASS;  Surgeon: Waynetta Sandy, MD;  Location: [place];  Service: Vascular;  Laterality: N/A;  . APPLICATION OF [place] N/A 03/29/2020   Procedure: [facility] MECHANICAL THROMBECTOMY;  Surgeon: Waynetta Sandy, MD;  Location: [place];  Service: Vascular;  Laterality: N/A;  . CESAREAN SECTION  2005  . COLON SURGERY    . ENDOVASCULAR STENT INSERTION N/A 03/29/2020   Procedure: Inferior Vena Cava WallStent Graft Insertion;   Surgeon: Waynetta Sandy, MD;  Location: [place];  Service: Vascular;  Laterality: N/A;  . INSERTION OF VENA CAVA FILTER Right 2012  . none    . PORT-A-CATH REMOVAL Right 12/22/2012   Procedure: REMOVAL PORT-A-CATH;  Surgeon: Odis Hollingshead, MD;  Location: [place];  Service: General;  Laterality: Right;  . PORTACATH PLACEMENT  02/21/11   tip in mid svc-rosenbower  . 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 THROMBECTOMY ILIAC ARTERY N/A 03/29/2020   Procedure: THROMBECTOMY ILIAC ARTERY MECHANICAL THROMBECTOMY;  Surgeon: Waynetta Sandy, MD;  Location: [place];  Service: Vascular;  Laterality: N/A;    There were no vitals filed for this visit.   Subjective Assessment - 06/23/20 0717    Subjective Pt reports that after last session she her legs got very numb and tingling that night and woke her up. Once she got up the next morning and got moving it improved.    Pertinent History PMHx of DM-2, HLD, hypothyroidism, PE-s/p IVC filter in 2012, history of colon cancer- 2012    Patient Stated Goals Pt wants to get back to her prior status.    Currently in Pain? No/denies                             [facility] Adult PT Treatment/Exercise - 06/23/20 4585  Ambulation/Gait   Ambulation/Gait Yes    Gait Comments Pt ambulated 4 min on treadmill at 1.34mh covering 0.12 miles with BUE support. Pt reported some tingling in legs and weakness especially in left as went on. Did have some decreased clearance. HR=98 after and 2/10 RPE. Pt was cued to increase step length and heel strike.      Neuro Re-ed    Neuro Re-ed Details  In // bars: standing on rockerboard positioned ant/post maintaining level  x 30 sec, then rocking back and forth x 10. Board positioned lateral maintaining level x 30 sec then adding in trunk rotation x 10 each side. Tandem gait 6' x 8. Walking on toes 6' x 6 then on heels 6' x 6. Pt performed step-ups on 4" step forward, lateral and  step-down x 5 each leg for each activity. Most challenged with stepping down and coming back up using left leg.      Exercises   Exercises Other Exercises      Knee/Hip Exercises: Aerobic   Nustep Level 5 x min using BUE and BLE. HR=80 after.                     PT Short Term Goals - 06/15/20 1754      PT SHORT TERM GOAL #1   Title Pt will be independent with initial HEP for strengthening and walking program.    Baseline Reports independence with HEP; not started walking program    Time 3    Period Weeks    Status Partially Met    Target Date 06/19/20      PT SHORT TERM GOAL #2   Title Pt will increase 30 sec sit to stand from 11 reps to 13 or more for improved functional strength and endurance.    Baseline 05/29/20 11 reps from chair without hands, completed 13 reps w/o UE support    Time 3    Period Weeks    Status Achieved    Target Date 06/19/20      PT SHORT TERM GOAL #3   Title Pt will ambulate >1000' on varied surfaces independently for improved gait in community.    Baseline ambulated 1200' on varied surfaces (indoor/outdoor)    Time 3    Period Weeks    Status Achieved    Target Date 06/19/20             PT Long Term Goals - 05/29/20 1247      PT LONG TERM GOAL #1   Title Pt will be independent with progressive HEP for strengthening, balance and aerobic exercises to be able to return to her dance classes.    Time 6    Period Weeks    Status New    Target Date 07/10/20      PT LONG TERM GOAL #2   Title Pt will be able to ambulate up/down 12 steps without railing in reciprocal pattern for improved functional strength.    Time 6    Period Weeks    Status New    Target Date 07/10/20      PT LONG TERM GOAL #3   Title Pt will increase FGA from 21/30 to >25/30 for improved balance and gait safety.    Baseline 05/29/20 21/30    Time 6    Period Weeks    Status New    Target Date 07/10/20      PT LONG TERM GOAL #4   Title Pt will  increase 6 min  walk from 960' to >1200' with <4/10 RPE for improved activity tolerance.    Baseline 05/29/20 960' with 6/10 RPE    Time 6    Period Weeks    Status New    Target Date 07/10/20                 Plan - 06/23/20 0842    Clinical Impression Statement Pt was most fatigued on treadmill today in legs. She reported left leg felt weak towards end and has some tingling in both legs on treaadmill. Continues to do well with standing activities.    Personal Factors and Comorbidities Comorbidity 3+    Comorbidities DM-2, HLD, hypothyroidism, PE-s/p IVC filter in 2012, history of colon cancer    Examination-Activity Limitations Locomotion Level;Stairs    Examination-Participation Restrictions Community Activity;Yard Work;Cleaning    Stability/Clinical Decision Making Stable/Uncomplicated    Rehab Potential Good    PT Frequency 2x / week   followed by 1x/week for 3 weeks   PT Duration 3 weeks    PT Treatment/Interventions ADLs/Self Care Home Management;Gait training;Stair training;Functional mobility training;Therapeutic activities;Therapeutic exercise;Patient/family education;Neuromuscular re-education;Balance training;Manual techniques;Vestibular    PT Next Visit Plan BLE strengthening, Dynamic Balance (beam, bosu). SciFit/Nustep/treadmill for Aerobic.    PT Home Exercise Plan [id number]    Consulted and Agree with Plan of Care Patient           Patient will benefit from skilled therapeutic intervention in order to improve the following deficits and impairments:  Abnormal gait, Decreased activity tolerance, Decreased balance, Decreased endurance, Decreased mobility, Decreased strength, Difficulty walking, Pain  Visit Diagnosis: Other abnormalities of gait and mobility  Muscle weakness (generalized)     Problem List Patient Active Problem List   Diagnosis Date Noted  . Sepsis due to COVID-19 ([place]) 03/22/2020  . Type 2 diabetes mellitus without complication, without long-term current use  of insulin ([place]) 12/03/2018  . Hypothyroidism 07/08/2018  . Bilateral calf pain 06/08/2015  . Chest pain 08/22/2014  . Hyperlipidemia 07/13/2014  . Family history of hypertrophic cardiomyopathy 07/13/2014  . DOE (dyspnea on exertion) 07/13/2014  . Diabetes ([place]) 11/01/2013  . Adiposity 11/01/2013  . Bilateral pulmonary embolism ([place]) 11/01/2013  . Obesity 11/01/2013  . Diabetes mellitus   . Malignant neoplasm of rectum ([employer] [employer]) 08/29/2011    Electa Sniff, PT, DPT, NCS 06/23/2020, 8:45 AM  [facility] [address] [place], [name], [address] Phone: [phone]   Fax:  [phone]  Name: [name] MRN: [id number] Date of Birth: 01/13/1966

## 2020-06-28 ENCOUNTER — Other Ambulatory Visit: Payer: Self-pay

## 2020-06-28 ENCOUNTER — Ambulatory Visit: Payer: No Typology Code available for payment source

## 2020-06-28 DIAGNOSIS — R2689 Other abnormalities of gait and mobility: Secondary | ICD-10-CM

## 2020-06-28 DIAGNOSIS — M6281 Muscle weakness (generalized): Secondary | ICD-10-CM

## 2020-06-28 NOTE — Therapy (Signed)
[facility] [address] [place], [name], [address] Phone: [phone]   Fax:  [phone]  Physical Therapy Treatment  Patient Details  Name: [name] MRN: [id number] Date of Birth: Oct 29, 1965 Referring Provider (PT): Dr. Ranell Patrick   Encounter Date: 06/28/2020   PT End of Session - 06/28/20 1703    Visit Number 8    Number of Visits [place]    PT Start Time 1700    PT Stop Time 1741    PT Time Calculation (min) 41 min    Activity Tolerance Patient tolerated treatment well    Behavior During Therapy [facility] for tasks assessed/performed           Past Medical History:  Diagnosis Date  . 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  ECC with HPV effect  . 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 Diabetes mellitus without complication ([place])   . Headache(784.0)    experiences migraine every couple of weeks. takes only excedrin  . 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  colposcopy adequate normal 10/2015   . Hyperlipidemia   . Pulmonary embolism ([place])   . STD (sexually transmitted disease) 1991   POS GC-TREATED  . Thyroid disease     Past Surgical History:  Procedure Laterality Date  . AORTA - BILATERAL FEMORAL ARTERY BYPASS GRAFT N/A 03/29/2020   Procedure: VENO VENOUS BYPASS;  Surgeon: Waynetta Sandy, MD;  Location: [place];  Service: Vascular;  Laterality: N/A;  . APPLICATION OF [place] N/A 03/29/2020   Procedure: [facility] MECHANICAL THROMBECTOMY;  Surgeon: Waynetta Sandy, MD;  Location: [place];  Service: Vascular;  Laterality: N/A;  . CESAREAN SECTION  2005  . COLON SURGERY    . ENDOVASCULAR STENT INSERTION N/A 03/29/2020   Procedure: Inferior Vena Cava WallStent Graft Insertion;  Surgeon: Waynetta Sandy, MD;   Location: [place];  Service: Vascular;  Laterality: N/A;  . INSERTION OF VENA CAVA FILTER Right 2012  . none    . PORT-A-CATH REMOVAL Right 12/22/2012   Procedure: REMOVAL PORT-A-CATH;  Surgeon: Odis Hollingshead, MD;  Location: [place];  Service: General;  Laterality: Right;  . PORTACATH PLACEMENT  02/21/11   tip in mid svc-rosenbower  . 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 THROMBECTOMY ILIAC ARTERY N/A 03/29/2020   Procedure: THROMBECTOMY ILIAC ARTERY MECHANICAL THROMBECTOMY;  Surgeon: Waynetta Sandy, MD;  Location: [place];  Service: Vascular;  Laterality: N/A;    There were no vitals filed for this visit.   Subjective Assessment - 06/28/20 1704    Subjective Pt reports that she is just a little tired today. Did well at beach this weekend but it did tire her out. Was challenged when tried to get in water some.    Pertinent History PMHx of DM-2, HLD, hypothyroidism, PE-s/p IVC filter in 2012, history of colon cancer- 2012    Patient Stated Goals Pt wants to get back to her prior status.    Currently in Pain? No/denies                             [facility] Adult PT Treatment/Exercise - 06/28/20 1704      Ambulation/Gait   Ambulation/Gait Yes    Ambulation/Gait  Assistance 7: Independent    Ambulation/Gait Assistance Details Pt ambulated for 9 min 40 sec outside. Reported some tingling in legs and some weakness but not as bad as has been.    Ambulation Distance (Feet) 1600 Feet    Assistive device None    Gait Pattern Step-through pattern      Neuro Re-ed    Neuro Re-ed Details  In // bars: walking tandem along soft blue foam beam 6' x 6 then adding in touching heel down before stepping 6' x 4. Side stepping on soft foam beam 6' x 6 without UE support. Standing on flat side of BOSU keeping balance without UE support x 30 sec then added in mini-squats x 10 with occasionally having to touch bar lightly. Pt reported calf getting tired as she went on.  Step-ups on round part of BOSU x 10 with each leg with occasional light UE support when using LLE.       Exercises   Exercises Other Exercises    Other Exercises  Leg press x 10 at 70# and then 10x 3 at 80# with verbal cues to not lock knees out and go slow and controlled.      Knee/Hip Exercises: Aerobic   Nustep Level 5 x 7 min with BUE and BLE to work on activity tolerance and strength. Pt reported legs                     PT Short Term Goals - 06/15/20 1754      PT SHORT TERM GOAL #1   Title Pt will be independent with initial HEP for strengthening and walking program.    Baseline Reports independence with HEP; not started walking program    Time 3    Period Weeks    Status Partially Met    Target Date 06/19/20      PT SHORT TERM GOAL #2   Title Pt will increase 30 sec sit to stand from 11 reps to 13 or more for improved functional strength and endurance.    Baseline 05/29/20 11 reps from chair without hands, completed 13 reps w/o UE support    Time 3    Period Weeks    Status Achieved    Target Date 06/19/20      PT SHORT TERM GOAL #3   Title Pt will ambulate >1000' on varied surfaces independently for improved gait in community.    Baseline ambulated 1200' on varied surfaces (indoor/outdoor)    Time 3    Period Weeks    Status Achieved    Target Date 06/19/20             PT Long Term Goals - 05/29/20 1247      PT LONG TERM GOAL #1   Title Pt will be independent with progressive HEP for strengthening, balance and aerobic exercises to be able to return to her dance classes.    Time 6    Period Weeks    Status New    Target Date 07/10/20      PT LONG TERM GOAL #2   Title Pt will be able to ambulate up/down 12 steps without railing in reciprocal pattern for improved functional strength.    Time 6    Period Weeks    Status New    Target Date 07/10/20      PT LONG TERM GOAL #3   Title Pt will increase FGA from 21/30 to >25/30 for improved balance and  gait safety.    Baseline 05/29/20 21/30    Time 6    Period Weeks    Status New    Target Date 07/10/20      PT LONG TERM GOAL #4   Title Pt will increase 6 min walk from 960' to >1200' with <4/10 RPE for improved activity tolerance.    Baseline 05/29/20 960' with 6/10 RPE    Time 6    Period Weeks    Status New    Target Date 07/10/20                 Plan - 06/28/20 1746    Clinical Impression Statement Pt able to increase gait distance with less fatigue in legs than prior sessions. She continues to tolerate more dynamic balance and strengthening activities.    Personal Factors and Comorbidities Comorbidity 3+    Comorbidities DM-2, HLD, hypothyroidism, PE-s/p IVC filter in 2012, history of colon cancer    Examination-Activity Limitations Locomotion Level;Stairs    Examination-Participation Restrictions Community Activity;Yard Work;Cleaning    Stability/Clinical Decision Making Stable/Uncomplicated    Rehab Potential Good    PT Frequency 2x / week   followed by 1x/week for 3 weeks   PT Duration 3 weeks    PT Treatment/Interventions ADLs/Self Care Home Management;Gait training;Stair training;Functional mobility training;Therapeutic activities;Therapeutic exercise;Patient/family education;Neuromuscular re-education;Balance training;Manual techniques;Vestibular    PT Next Visit Plan BLE strengthening, Dynamic Balance (beam, bosu). SciFit/Nustep/treadmill for Aerobic.    PT Home Exercise Plan [id number]    Consulted and Agree with Plan of Care Patient           Patient will benefit from skilled therapeutic intervention in order to improve the following deficits and impairments:  Abnormal gait, Decreased activity tolerance, Decreased balance, Decreased endurance, Decreased mobility, Decreased strength, Difficulty walking, Pain  Visit Diagnosis: Other abnormalities of gait and mobility  Muscle weakness (generalized)     Problem List Patient Active Problem List   Diagnosis  Date Noted  . Sepsis due to COVID-19 ([place]) 03/22/2020  . Type 2 diabetes mellitus without complication, without long-term current use of insulin ([place]) 12/03/2018  . Hypothyroidism 07/08/2018  . Bilateral calf pain 06/08/2015  . Chest pain 08/22/2014  . Hyperlipidemia 07/13/2014  . Family history of hypertrophic cardiomyopathy 07/13/2014  . DOE (dyspnea on exertion) 07/13/2014  . Diabetes ([place]) 11/01/2013  . Adiposity 11/01/2013  . Bilateral pulmonary embolism ([place]) 11/01/2013  . Obesity 11/01/2013  . Diabetes mellitus   . Malignant neoplasm of rectum ([place]) 08/29/2011    Electa Sniff, PT, DPT, NCS 06/28/2020, 5:50 PM  [facility] [address] [place], [name], [address] Phone: [phone]   Fax:  [phone]  Name: [name] MRN: [id number] Date of Birth: 12-29-1965

## 2020-07-05 ENCOUNTER — Ambulatory Visit: Payer: No Typology Code available for payment source

## 2020-07-05 ENCOUNTER — Other Ambulatory Visit: Payer: Self-pay

## 2020-07-05 DIAGNOSIS — M6281 Muscle weakness (generalized): Secondary | ICD-10-CM

## 2020-07-05 DIAGNOSIS — R2689 Other abnormalities of gait and mobility: Secondary | ICD-10-CM | POA: Diagnosis not present

## 2020-07-05 NOTE — Therapy (Signed)
[facility] [address] [place], [name], [address] Phone: [phone]   Fax:  [phone]  Physical Therapy Treatment/Discharge summary  Patient Details  Name: [name] MRN: [id number] Date of Birth: 11-18-1965 Referring Provider (PT): Dr. Ranell Patrick  PHYSICAL THERAPY DISCHARGE SUMMARY  Visits from Start of Care: 9  Current functional level related to goals / functional outcomes: See clinical impression and goals for more information. Pt is ambulating independently in the community. Has met all goals.   Remaining deficits: Still fatigues with more strenuous activities, tingling in legs at times.   Education / Equipment: HEP  Plan: Patient agrees to discharge.  Patient goals were met. Patient is being discharged due to meeting the stated rehab goals.  ?????       Encounter Date: 07/05/2020   PT End of Session - 07/05/20 1706    Visit Number 9    Number of Visits [place]    PT Start Time 1705    PT Stop Time 7939   finished early as d/c visit   PT Time Calculation (min) 30 min    Activity Tolerance Patient tolerated treatment well    Behavior During Therapy WFL for tasks assessed/performed           Past Medical History:  Diagnosis Date  . 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  ECC with HPV effect  . Cancer of colon with rectum ([place]) 2012  . Diabetes mellitus without complication ([place])   . Headache(784.0)    experiences migraine every couple of weeks. takes only excedrin  . 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  colposcopy adequate normal 10/2015   . Hyperlipidemia   . Pulmonary embolism ([place])   . STD (sexually transmitted disease) 1991   POS GC-TREATED  . Thyroid disease     Past Surgical History:  Procedure  Laterality Date  . AORTA - BILATERAL FEMORAL ARTERY BYPASS GRAFT N/A 03/29/2020   Procedure: VENO VENOUS BYPASS;  Surgeon: Waynetta Sandy, MD;  Location: [facility];  Service: Vascular;  Laterality: N/A;  . APPLICATION OF [place] N/A 03/29/2020   Procedure: [facility] MECHANICAL THROMBECTOMY;  Surgeon: Waynetta Sandy, MD;  Location: [facility];  Service: Vascular;  Laterality: N/A;  . CESAREAN SECTION  2005  . COLON SURGERY    . ENDOVASCULAR STENT INSERTION N/A 03/29/2020   Procedure: Inferior Vena Cava WallStent Graft Insertion;  Surgeon: Waynetta Sandy, MD;  Location: [place];  Service: Vascular;  Laterality: N/A;  . INSERTION OF VENA CAVA FILTER Right 2012  . none    . PORT-A-CATH REMOVAL Right 12/22/2012   Procedure: REMOVAL PORT-A-CATH;  Surgeon: Odis Hollingshead, MD;  Location: [facility];  Service: General;  Laterality: Right;  . PORTACATH PLACEMENT  02/21/11   tip in mid [id number]-[id number]  . 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 THROMBECTOMY ILIAC ARTERY N/A 03/29/2020   Procedure: THROMBECTOMY ILIAC ARTERY MECHANICAL THROMBECTOMY;  Surgeon: Waynetta Sandy, MD;  Location: [facility];  Service: Vascular;  Laterality: N/A;    There were no vitals filed for this visit.   Subjective Assessment - 07/05/20 1707    Subjective Pt reports that she is doing pretty well other than last night did have an episode of legs being more tingling so did not rest  well.    Pertinent History PMHx of DM-2, HLD, hypothyroidism, PE-s/p IVC filter in 2012, history of colon cancer- 2012    Patient Stated Goals Pt wants to get back to her prior status.    Currently in Pain? No/denies              [facility] PT Assessment - 07/05/20 1707      6 Minute walk- Post Test   Modified Borg Scale for Dyspnea 2- Mild shortness of breath      6 minute walk test results    Aerobic Endurance Distance Walked 1250      Functional Gait  Assessment   Gait assessed  Yes    Gait Level  Surface Walks 20 ft in less than 5.5 sec, no assistive devices, good speed, no evidence for imbalance, normal gait pattern, deviates no more than 6 in outside of the 12 in walkway width.    Change in Gait Speed Able to smoothly change walking speed without loss of balance or gait deviation. Deviate no more than 6 in outside of the 12 in walkway width.    Gait with Horizontal Head Turns Performs head turns smoothly with no change in gait. Deviates no more than 6 in outside 12 in walkway width    Gait with Vertical Head Turns Performs head turns with no change in gait. Deviates no more than 6 in outside 12 in walkway width.    Gait and Pivot Turn Pivot turns safely within 3 sec and stops quickly with no loss of balance.    Step Over Obstacle Is able to step over 2 stacked shoe boxes taped together (9 in total height) without changing gait speed. No evidence of imbalance.    Gait with Narrow Base of Support Is able to ambulate for 10 steps heel to toe with no staggering.    Gait with Eyes Closed Walks 20 ft, no assistive devices, good speed, no evidence of imbalance, normal gait pattern, deviates no more than 6 in outside 12 in walkway width. Ambulates 20 ft in less than 7 sec.    Ambulating Backwards Walks 20 ft, no assistive devices, good speed, no evidence for imbalance, normal gait    Steps Alternating feet, no rail.    Total Score 30                         [facility] Adult PT Treatment/Exercise - 07/05/20 1707      Ambulation/Gait   Ambulation/Gait Yes    Ambulation/Gait Assistance 7: Independent    Ambulation/Gait Assistance Details outside for 6 min walk and then completed lap. Pt reported 2/10 RPE and no increased pain or tingling in legs.    Ambulation Distance (Feet) 1600 Feet    Assistive device None    Gait Pattern Step-through pattern      Neuro Re-ed    Neuro Re-ed Details  In // bars: on Bosu standing on flat side trying to maintain level x 30 sec eyes open then 30 sec  eyes closed with having to touch occasionally with eyes closed. Mini-squats on flat side of BOSU x 10 without hands. Step-ups on round side of BOSU x 10 each leg. Pt challenged at times on this. She wants to try to get a BOSU to work more at home.                  PT Education - 07/05/20 1747    Education Details Discussed  plan to d/c early as pt has met goals. Pt in agreement and feels she has improved a lot. Discussed ways to continue to progress activities. Pt going to try some home dance aerobics. Will continue to work on her exercises.    Person(s) Educated Patient    Methods Explanation    Comprehension Verbalized understanding            PT Short Term Goals - 06/15/20 1754      PT SHORT TERM GOAL #1   Title Pt will be independent with initial HEP for strengthening and walking program.    Baseline Reports independence with HEP; not started walking program    Time 3    Period Weeks    Status Partially Met    Target Date 06/19/20      PT SHORT TERM GOAL #2   Title Pt will increase 30 sec sit to stand from 11 reps to 13 or more for improved functional strength and endurance.    Baseline 05/29/20 11 reps from chair without hands, completed 13 reps w/o UE support    Time 3    Period Weeks    Status Achieved    Target Date 06/19/20      PT SHORT TERM GOAL #3   Title Pt will ambulate >1000' on varied surfaces independently for improved gait in community.    Baseline ambulated 1200' on varied surfaces (indoor/outdoor)    Time 3    Period Weeks    Status Achieved    Target Date 06/19/20             PT Long Term Goals - 07/05/20 1721      PT LONG TERM GOAL #1   Title Pt will be independent with progressive HEP for strengthening, balance and aerobic exercises to be able to return to her dance classes.    Baseline Pt denies any questions on exercises. She has been performing. Has not returned to dance yet but more due to not wanting to wear mask. May try home dance  workout.    Time 6    Period Weeks    Status Achieved      PT LONG TERM GOAL #2   Title Pt will be able to ambulate up/down 12 steps without railing in reciprocal pattern for improved functional strength.    Baseline 12 steps with reciprocal pattern independently    Time 6    Period Weeks    Status Achieved      PT LONG TERM GOAL #3   Title Pt will increase FGA from 21/30 to >25/30 for improved balance and gait safety.    Baseline 05/29/20 21/30. 07/05/20 FGA=30/30    Time 6    Period Weeks    Status Achieved      PT LONG TERM GOAL #4   Title Pt will increase 6 min walk from 960' to >1200' with <4/10 RPE for improved activity tolerance.    Baseline 05/29/20 960' with 6/10 RPE. 07/05/20 6 min walk outside 1250' with RPE=2/10    Time 6    Period Weeks    Status Achieved                 Plan - 07/05/20 1748    Clinical Impression Statement Pt has progressed well meeting all goals at this time. She increased FGA score to 30/30 indicating no fall risk. She was able to increase her 6 min walk distance with low RPE of 2/10. Pt feels she  has gotten much stronger and is good with continuing exercises on own. PT discharging today.    Personal Factors and Comorbidities Comorbidity 3+    Comorbidities DM-2, HLD, hypothyroidism, PE-s/p IVC filter in 2012, history of colon cancer    Examination-Activity Limitations Locomotion Level;Stairs    Examination-Participation Restrictions Community Activity;Yard Work;Cleaning    Stability/Clinical Decision Making Stable/Uncomplicated    Rehab Potential Good    PT Frequency 2x / week   followed by 1x/week for 3 weeks   PT Duration 3 weeks    PT Treatment/Interventions ADLs/Self Care Home Management;Gait training;Stair training;Functional mobility training;Therapeutic activities;Therapeutic exercise;Patient/family education;Neuromuscular re-education;Balance training;Manual techniques;Vestibular    PT Next Visit Plan discharged today.    PT Home  Exercise Plan [id number]    Consulted and Agree with Plan of Care Patient           Patient will benefit from skilled therapeutic intervention in order to improve the following deficits and impairments:  Abnormal gait, Decreased activity tolerance, Decreased balance, Decreased endurance, Decreased mobility, Decreased strength, Difficulty walking, Pain  Visit Diagnosis: Other abnormalities of gait and mobility  Muscle weakness (generalized)     Problem List Patient Active Problem List   Diagnosis Date Noted  . Sepsis due to COVID-19 ([place]) 03/22/2020  . Type 2 diabetes mellitus without complication, without long-term current use of insulin ([place]) 12/03/2018  . Hypothyroidism 07/08/2018  . Bilateral calf pain 06/08/2015  . Chest pain 08/22/2014  . Hyperlipidemia 07/13/2014  . Family history of hypertrophic cardiomyopathy 07/13/2014  . DOE (dyspnea on exertion) 07/13/2014  . Diabetes ([place]) 11/01/2013  . Adiposity 11/01/2013  . Bilateral pulmonary embolism ([place]) 11/01/2013  . Obesity 11/01/2013  . Diabetes mellitus   . Malignant neoplasm of rectum ([place]) 08/29/2011    Electa Sniff, PT, DPT, NCS 07/05/2020, 5:50 PM  [facility] [address] [place], [name], [address] Phone: [phone]   Fax:  [phone]  Name: [name] MRN: [id number] Date of Birth: 1966/01/15

## 2020-07-12 ENCOUNTER — Ambulatory Visit: Payer: No Typology Code available for payment source

## 2020-07-17 ENCOUNTER — Telehealth: Payer: Self-pay | Admitting: Nurse Practitioner

## 2020-07-17 ENCOUNTER — Inpatient Hospital Stay: Payer: No Typology Code available for payment source

## 2020-07-17 ENCOUNTER — Inpatient Hospital Stay: Payer: No Typology Code available for payment source | Admitting: Hematology and Oncology

## 2020-07-17 ENCOUNTER — Encounter: Payer: Self-pay | Admitting: *Deleted

## 2020-07-17 ENCOUNTER — Telehealth: Payer: Self-pay | Admitting: *Deleted

## 2020-07-17 NOTE — Progress Notes (Signed)
Dr. Benay Spice has agreed to see patient again as new patient for Dx: DVT. Notified new patient scheduler to put her on schedule Wednesday w/Lisa Marcello Moores, NP. Dr. Libby Maw nurse is calling patient that she will be rescheduled to see Dr. Benay Spice since he has followed her in past for rectal cancer

## 2020-07-17 NOTE — Telephone Encounter (Signed)
TCT patient to advise that since she has seen Dr. Benay Spice for several years, we are going to re-schedule her appt to Dr. Benay Spice,  And she will not see Dr. Lorenso Courier today.  She voiced understanding

## 2020-07-17 NOTE — Telephone Encounter (Signed)
Pt has been cld and scheduled to see Lattie Haw on 9/29 at 1045am.

## 2020-07-19 ENCOUNTER — Telehealth: Payer: Self-pay | Admitting: Oncology

## 2020-07-19 ENCOUNTER — Encounter: Payer: Self-pay | Admitting: Nurse Practitioner

## 2020-07-19 ENCOUNTER — Inpatient Hospital Stay: Payer: No Typology Code available for payment source | Attending: Nurse Practitioner | Admitting: Nurse Practitioner

## 2020-07-19 ENCOUNTER — Other Ambulatory Visit: Payer: Self-pay

## 2020-07-19 ENCOUNTER — Telehealth: Payer: Self-pay | Admitting: Nurse Practitioner

## 2020-07-19 VITALS — BP 133/82 | HR 70 | Resp 18 | Ht 71.0 in | Wt 190.0 lb

## 2020-07-19 DIAGNOSIS — Z08 Encounter for follow-up examination after completed treatment for malignant neoplasm: Secondary | ICD-10-CM | POA: Diagnosis present

## 2020-07-19 DIAGNOSIS — Z8616 Personal history of COVID-19: Secondary | ICD-10-CM | POA: Diagnosis not present

## 2020-07-19 DIAGNOSIS — Z85048 Personal history of other malignant neoplasm of rectum, rectosigmoid junction, and anus: Secondary | ICD-10-CM | POA: Insufficient documentation

## 2020-07-19 DIAGNOSIS — Z7982 Long term (current) use of aspirin: Secondary | ICD-10-CM | POA: Diagnosis not present

## 2020-07-19 DIAGNOSIS — Z86718 Personal history of other venous thrombosis and embolism: Secondary | ICD-10-CM | POA: Insufficient documentation

## 2020-07-19 DIAGNOSIS — Z7901 Long term (current) use of anticoagulants: Secondary | ICD-10-CM | POA: Insufficient documentation

## 2020-07-19 DIAGNOSIS — Z86711 Personal history of pulmonary embolism: Secondary | ICD-10-CM | POA: Diagnosis not present

## 2020-07-19 DIAGNOSIS — C20 Malignant neoplasm of rectum: Secondary | ICD-10-CM | POA: Diagnosis not present

## 2020-07-19 NOTE — Telephone Encounter (Signed)
Scheduled per 9/29 los. Printed avs and calendar for pt. Referral sent to [facility] HIM pool.

## 2020-07-19 NOTE — Progress Notes (Addendum)
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  She completed adjuvant chemotherapy.  She was diagnosed with a pulmonary embolism March 2012 status post IVC filter placement prior to the rectal surgery.  Risk factors for pulmonary embolism felt to be related to obesity, oral contraceptive use, rectal cancer and a sedentary job.  She completed a course of Coumadin.  She subsequently began daily aspirin 81 mg daily.  Her last appointment with medical oncology was May 2020.  She was diagnosed with Covid on 03/10/2020.  She was admitted to the hospital 03/21/2020 with fatigue/malaise, low back pain.  She was found to have patchy airspace disease on CT. MRI of the thoracic/lumbar spine showed T7 disc herniation but also showed extensive clot in the IVC.  Subsequently Doppler study showed extensive DVT in bilateral lower extremity.  She was started on IV heparin.  Anticoagulation changed to argatroban due to thrombocytopenia, HIT antibody negative.  The platelet count improved.  Vascular surgery was consulted.  She underwent catheter guided lytic therapy, mechanical thrombectomy, IVC filter removal which was complicated by IVC tear requiring IVC stenting.  Postoperatively she developed acute blood loss anemia with hypovolemic shock, required pressors.  She stabilized and was transferred to the medical unit then developed vomiting and was ultimately found to have an ileus/partial small bowel obstruction.  She was transitioned to Eliquis which she continues.  She reports no blood clots since completing the course of Coumadin/beginning aspirin until June of this year.  She continues Eliquis twice daily.  She denies bleeding.  She notes leg swelling and pain with prolonged standing/activity.  No change  in baseline bowel habits.  She is up-to-date on colonoscopy surveillance.  No fever, cough, shortness of breath.  No family history of blood clots.    Objective:  Vital signs in last 24 hours:  Blood pressure 133/82, pulse 70, resp. rate 18, height 5\' 11"  (1.803 m), weight 190 lb (86.2 kg), last menstrual period 09/12/2011, SpO2 100 %.    HEENT: Neck without mass. Lymphatics: No palpable cervical, supraclavicular, axillary or inguinal lymph nodes. Resp: Lungs clear bilaterally. Cardio: Regular rate and rhythm. GI: Abdomen soft and nontender.  No hepatosplenomegaly.  No mass. Vascular: No leg edema.  The left lower leg appears slightly larger than the right lower leg.  Calves soft and nontender.   Lab Results:  Lab Results  Component Value Date   WBC 4.0 04/20/2020   HGB 12.2 04/20/2020   HCT 38.4 04/20/2020   MCV 94 04/20/2020   PLT 407 04/20/2020   NEUTROABS 2.7 04/20/2020    Imaging:  No results found.  Medications: I have reviewed the patient's current medications.  Assessment/Plan: 1. Rectal cancer, stage III (T3 N1b), status post a low anterior resection 01/31/2011, status post radiation with continuous infusion 5-FU 03/19/2011 through 04/30/2011. Status post FOLFOX beginning 07/03/2011 for 8 cycles. Surveillance colonoscopy 02/19/2012-negative for recurrent cancer or polyps. Surveillance colonoscopy 04/15/2014-entire examined colon normal; terminal ileum appeared normal. Next colonoscopy recommended at a 5 year interval.  Surveillance CT scans negative 12/17/2013 2. Pulmonary embolism March 2012, status post IVC filter placement prior to cancer surgery in April 2012, followed by Dr. Chase Caller. Risk factors for pulmonary embolism felt to be related to obesity, oral contraceptive use, rectal cancer, and sedentary job.  Coumadin discontinued 2014, aspirin initiated 3. Removal of Port-A-Cath March 2014 4. Urinary/fecal incontinence. Initially improved with the pelvic  physical therapy program. 5. Diagnosed with Covid 03/10/2020 6. Hospitalized 03/21/2020 with fatigue/malaise, low back pain.  She was found to have multifocal patchy airspace opacities throughout both lungs consistent with multifocal pneumonia on CT 03/21/2020. MRI of the thoracic/lumbar spine 03/22/2020 showed T7 disc herniation but also showed thrombosis of the IVC and both iliac veins to the level of the IVC filter.  Subsequently Doppler study showed extensive DVT in bilateral lower extremity.  She was started on IV heparin.  Anticoagulation changed to argatroban due to thrombocytopenia, HIT antibody negative 03/30/2020.  The platelet count improved.  Vascular surgery was consulted.  She underwent catheter guided lytic therapy 03/28/2020, mechanical thrombectomy, IVC filter removal which was complicated by IVC tear requiring IVC stenting 03/29/2020.  Postoperatively she developed acute blood loss anemia with hypovolemic shock, required pressors.  She stabilized and was transferred to the medical unit then developed vomiting and was ultimately found to have an ileus/partial small bowel obstruction.  She was transitioned to Eliquis which she continues. 7. 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   Disposition: [name] appears well.  She remains in clinical remission from rectal cancer.  She was diagnosed with Covid in May 2021.  She was subsequently hospitalized with multifocal pneumonia, found to have thrombosis of the IVC and extensive bilateral lower extremity DVT; she underwent lytic therapy, mechanical thrombectomy and IVC filter removal; IVC tear requiring stenting.  She was initially placed on heparin, changed to argatroban due to thrombocytopenia.  HIT panel negative.  Transitioned to Eliquis which she continues.  Dr. Benay Spice discussed duration of  anticoagulation with Ms. Takeshita at today's appointment, 6 months versus indefinite.  We made a referral to Dr. Joan Flores, Hematology at [facility].  She understands to continue Eliquis.  We will see her in follow-up in 2 months.  Patient seen with Dr. Benay Spice.    Ned Card ANP/GNP-BC   07/19/2020  11:10 AM  Ms. Gottsch was interviewed and examined.  She has a remote history of rectal cancer remains in clinical remission.  She developed extensive bilateral lower extremity DVTs following a COVID-19 infection and with an IVC filter in place.  There was clot associated with the IVC filter. She has recovered from a lengthy hospital admission which included thrombolytic therapy and mechanical thrombectomy/IVC filter removal.  She has a history of a remote pulmonary embolism when she had rectal cancer.  We recommend she continue apixaban anticoagulation for now.  Dr. Gwenlyn Saran recommends continuing apixaban for 1 year to be followed by indefinite aspirin.  An IVC stent remains in place.  We will make referral to the coagulation service at [facility] for recommendations regarding the length of anticoagulation in her case.  Risk factors for thrombosis are no longer present other than the IVC stent.  She had a negative hypercoagulation panel in 2012.  An argument can be made for continuing indefinite anticoagulation therapy with her history of life-threatening venous thrombosis and an indwelling IVC stent.  Julieanne Manson, MD

## 2020-07-19 NOTE — Telephone Encounter (Signed)
Release: [id number] *Outgoign Referral Faxed medical records to [facility] @ fax [phone]

## 2020-07-20 ENCOUNTER — Encounter: Payer: Self-pay | Admitting: *Deleted

## 2020-07-20 NOTE — Progress Notes (Signed)
Faxed referral order, demographics and chart information to Dr. Gareth Morgan at [facility] [phone].

## 2020-08-04 ENCOUNTER — Telehealth: Payer: Self-pay | Admitting: *Deleted

## 2020-08-04 NOTE — Telephone Encounter (Signed)
Informed patient that [facility] has been trying to reach her for referral re: coagulation. Provided her the phone [phone] to call the referral coordinator back to make the appointment. She tells RN that they may have called, but she does not answer phone # she does not recognize.

## 2020-08-16 ENCOUNTER — Other Ambulatory Visit: Payer: Self-pay

## 2020-08-16 ENCOUNTER — Ambulatory Visit
Admission: RE | Admit: 2020-08-16 | Discharge: 2020-08-16 | Disposition: A | Discharge status: Home / Self Care | Payer: No Typology Code available for payment source | Source: Ambulatory Visit | Attending: Internal Medicine | Admitting: Internal Medicine

## 2020-08-16 DIAGNOSIS — Z1231 Encounter for screening mammogram for malignant neoplasm of breast: Secondary | ICD-10-CM

## 2020-08-23 ENCOUNTER — Other Ambulatory Visit: Payer: Self-pay

## 2020-08-23 ENCOUNTER — Encounter: Payer: Self-pay | Admitting: Nurse Practitioner

## 2020-08-23 ENCOUNTER — Ambulatory Visit (INDEPENDENT_AMBULATORY_CARE_PROVIDER_SITE_OTHER): Payer: No Typology Code available for payment source | Admitting: Nurse Practitioner

## 2020-08-23 VITALS — BP 122/78

## 2020-08-23 DIAGNOSIS — R87611 Atypical squamous cells cannot exclude high grade squamous intraepithelial lesion on cytologic smear of cervix (ASC-H): Secondary | ICD-10-CM | POA: Diagnosis not present

## 2020-08-23 NOTE — Addendum Note (Signed)
Addended by: Lorine Bears on: 08/23/2020 09:06 AM   Modules accepted: Orders

## 2020-08-23 NOTE — Progress Notes (Signed)
History: 54 year old presents today for repeat Pap.  Pap 02/15/2020 showed ASC-H positive HPV but negative for 16/18/45.  January 2020 LGSIL confirmed by biopsy.  Cryo many years ago.  History of rectal cancer in 2012 treated with surgery, radiation, and chemotherapy.  Exam: Appears well [facility]              Inguinal/mons:  Normal without inguinal adenopathy             External genitalia:  Normal             BUS/Urethra/Skene's glands:  Normal             Vagina:  Normal             Cervix:  Normal  Assessment: Atypical squamous cells cannot exclude high grade squamous intraepithelial lesion on cytologic smear of cervix (ASC-H) - 01/2020  Plan: Pap today.  Will triage once we get results.  Patient is agreeable to plan.

## 2020-09-06 LAB — PAP IG W/ RFLX HPV ASCU

## 2020-09-06 LAB — HUMAN PAPILLOMAVIRUS, HIGH RISK: HPV DNA High Risk: DETECTED — AB

## 2020-09-06 LAB — HPV TYPE 16 AND 18/45 RNA
HPV Type 16 RNA: NOT DETECTED
HPV Type 18/45 RNA: NOT DETECTED

## 2020-09-18 ENCOUNTER — Inpatient Hospital Stay: Payer: No Typology Code available for payment source | Admitting: Oncology

## 2020-09-26 ENCOUNTER — Ambulatory Visit (INDEPENDENT_AMBULATORY_CARE_PROVIDER_SITE_OTHER): Payer: No Typology Code available for payment source | Admitting: Obstetrics and Gynecology

## 2020-09-26 ENCOUNTER — Encounter: Payer: Self-pay | Admitting: Obstetrics and Gynecology

## 2020-09-26 ENCOUNTER — Other Ambulatory Visit: Payer: Self-pay

## 2020-09-26 VITALS — BP 124/80

## 2020-09-26 DIAGNOSIS — N870 Mild cervical dysplasia: Secondary | ICD-10-CM | POA: Diagnosis not present

## 2020-09-26 DIAGNOSIS — R87810 Cervical high risk human papillomavirus (HPV) DNA test positive: Secondary | ICD-10-CM | POA: Diagnosis not present

## 2020-09-26 DIAGNOSIS — R87610 Atypical squamous cells of undetermined significance on cytologic smear of cervix (ASC-US): Secondary | ICD-10-CM

## 2020-09-26 NOTE — Progress Notes (Signed)
[name] [birth year] [id number]  SUBJECTIVE:  54 y.o. [id number] female presents for a colposcopy exam.  Pap smear 02/15/2020 showed ASC-H cytology with positive high-risk HPV, negative for subtype 16, 18, 45.  She returned for recommended 38-month follow-up Pap smear with results showing ASCUS cytology, positive high-risk HPV, negative for subtype 16, 18, 45.  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  History of cryosurgery many years ago.  She did have rectal cancer in 2012 treated with surgery, radiation, chemotherapy.   Current Outpatient Medications  Medication Sig Dispense Refill  . apixaban (ELIQUIS) 5 MG TABS tablet Take 1 tablet (5 mg total) by mouth 2 (two) times daily. 180 tablet 3  . Ascorbic Acid (VITAMIN C ADULT GUMMIES PO) Take 1 tablet by mouth daily.    . cholecalciferol (VITAMIN D) 400 UNITS TABS tablet Take 400 Units by mouth daily.     . cyanocobalamin 500 MCG tablet Take 500 mcg by mouth daily.    . Insulin Lispro (HUMALOG [place]) Inject 10 Units into the skin 3 (three) times daily with meals.     Marland Kitchen JARDIANCE 25 MG TABS tablet Take 25 mg by mouth daily.    Marland Kitchen levothyroxine (SYNTHROID, LEVOTHROID) 100 MCG tablet Take 100 mcg by mouth daily before breakfast.     . Multiple Vitamin (MULTIVITAMIN) tablet Take 1 tablet by mouth daily.    . simvastatin (ZOCOR) 20 MG tablet Take 0.5 tablets (10 mg total) by mouth daily. 90 tablet 1  . Zinc 100 MG TABS Take 1 tablet by mouth daily.     No current facility-administered medications for this visit.   Allergies: Metformin and related and Penicillins  Patient's last menstrual period was 09/12/2011.  Past medical history,surgical history, problem list, medications, allergies, family history and social history were all reviewed and documented as reviewed in the EPIC chart.   OBJECTIVE:  BP 124/80   LMP 09/12/2011  The patient appears well, alert, oriented, in no distress. PELVIC EXAM: VULVA: normal appearing vulva with  atrophic change, no masses, tenderness or lesions, VAGINA: normal appearing vagina with thick change, normal color and discharge, no lesions, CERVIX: normal appearing cervix without discharge or lesions  Physical Exam Genitourinary:       Procedure note Colposcopy The cervix was visualized.  Dilute acetic acid cleanse was performed.  Lugol solution was applied.  There were noted to be some acetowhite changes at the posterior transformation zone which appeared to be "bump-like".  Biopsy was taken of one of the bump areas at 6:00.  Transformation zone was not seen in its entirety, so today's exam is inadequate.  There was also a faint acetowhite change on the ectocervix from 12-2:00 where a second biopsy was taken.  Application of silver nitrate cautery to both areas resulted in excellent hemostasis.  The patient tolerated the procedure well without complication.  General impression consistent with possible mild dysplasia.  Chaperone: [name] present during the examination  ASSESSMENT:  54 y.o. [id number] here for colposcopy examination  PLAN:  We reviewed her Pap smear history within the last year.  Cytology improved over the course from 02/08/2020 to 09/09/2020, but still ASCUS/+HPV so we did proceed with colposcopy today.  We discussed the role of HPV in mediating dysplastic changes of the cervix.  We reviewed that today's examination findings indicate possible mild dysplasia.  I showed her diagrams of the cervix to illustrate the findings noted today.  We will follow up with her regarding the results as  soon as they are available.  Next steps in management will be dictated by the results from today's biopsies.   Joseph Pierini MD 09/26/20

## 2020-09-28 LAB — TISSUE PATH REPORT

## 2020-09-28 LAB — PATHOLOGY REPORT

## 2020-10-03 ENCOUNTER — Encounter: Payer: Self-pay | Admitting: Obstetrics and Gynecology

## 2020-10-03 NOTE — Telephone Encounter (Signed)
Pap recall in 1 year

## 2020-10-04 NOTE — Telephone Encounter (Signed)
One year recall placed.

## 2020-11-07 ENCOUNTER — Ambulatory Visit: Payer: Self-pay | Admitting: Oncology

## 2020-12-05 ENCOUNTER — Other Ambulatory Visit: Payer: Self-pay

## 2020-12-05 ENCOUNTER — Encounter: Payer: Self-pay | Admitting: Nurse Practitioner

## 2020-12-05 ENCOUNTER — Ambulatory Visit (INDEPENDENT_AMBULATORY_CARE_PROVIDER_SITE_OTHER): Payer: No Typology Code available for payment source | Admitting: Nurse Practitioner

## 2020-12-05 VITALS — BP 124/80 | HR 63 | Temp 97.9°F | Ht 71.0 in | Wt 193.8 lb

## 2020-12-05 DIAGNOSIS — Z0000 Encounter for general adult medical examination without abnormal findings: Secondary | ICD-10-CM | POA: Diagnosis not present

## 2020-12-05 DIAGNOSIS — R202 Paresthesia of skin: Secondary | ICD-10-CM

## 2020-12-05 DIAGNOSIS — R3121 Asymptomatic microscopic hematuria: Secondary | ICD-10-CM

## 2020-12-05 DIAGNOSIS — Z86711 Personal history of pulmonary embolism: Secondary | ICD-10-CM

## 2020-12-05 DIAGNOSIS — E119 Type 2 diabetes mellitus without complications: Secondary | ICD-10-CM | POA: Diagnosis not present

## 2020-12-05 DIAGNOSIS — E782 Mixed hyperlipidemia: Secondary | ICD-10-CM

## 2020-12-05 DIAGNOSIS — E039 Hypothyroidism, unspecified: Secondary | ICD-10-CM | POA: Diagnosis not present

## 2020-12-05 DIAGNOSIS — N39498 Other specified urinary incontinence: Secondary | ICD-10-CM

## 2020-12-05 DIAGNOSIS — Z86718 Personal history of other venous thrombosis and embolism: Secondary | ICD-10-CM

## 2020-12-05 DIAGNOSIS — Z1159 Encounter for screening for other viral diseases: Secondary | ICD-10-CM

## 2020-12-05 LAB — POCT URINALYSIS DIPSTICK
Bilirubin, UA: NEGATIVE
Glucose, UA: POSITIVE — AB
Ketones, UA: NEGATIVE
Leukocytes, UA: NEGATIVE
Nitrite, UA: NEGATIVE
Protein, UA: NEGATIVE
Spec Grav, UA: 1.02 (ref 1.010–1.025)
Urobilinogen, UA: 0.2 E.U./dL
pH, UA: 5.5 (ref 5.0–8.0)

## 2020-12-05 LAB — POCT UA - MICROALBUMIN
Albumin/Creatinine Ratio, Urine, POC: <30
Creatinine, POC: 100 mg/dL
Microalbumin Ur, POC: 10 mg/L

## 2020-12-05 NOTE — Patient Instructions (Signed)
Health Maintenance, Female Adopting a healthy lifestyle and getting preventive care are important in promoting health and wellness. Ask your health care provider about:  The right schedule for you to have regular tests and exams.  Things you can do on your own to prevent diseases and keep yourself healthy. What should I know about diet, weight, and exercise? Eat a healthy diet  Eat a diet that includes plenty of vegetables, fruits, low-fat dairy products, and lean protein.  Do not eat a lot of foods that are high in solid fats, added sugars, or sodium.   Maintain a healthy weight Body mass index (BMI) is used to identify weight problems. It estimates body fat based on height and weight. Your health care provider can help determine your BMI and help you achieve or maintain a healthy weight. Get regular exercise Get regular exercise. This is one of the most important things you can do for your health. Most adults should:  Exercise for at least 150 minutes each week. The exercise should increase your heart rate and make you sweat (moderate-intensity exercise).  Do strengthening exercises at least twice a week. This is in addition to the moderate-intensity exercise.  Spend less time sitting. Even light physical activity can be beneficial. Watch cholesterol and blood lipids Have your blood tested for lipids and cholesterol at 54 years of age, then have this test every 5 years. Have your cholesterol levels checked more often if:  Your lipid or cholesterol levels are high.  You are older than 54 years of age.  You are at high risk for heart disease. What should I know about cancer screening? Depending on your health history and family history, you may need to have cancer screening at various ages. This may include screening for:  Breast cancer.  Cervical cancer.  Colorectal cancer.  Skin cancer.  Lung cancer. What should I know about heart disease, diabetes, and high blood  pressure? Blood pressure and heart disease  High blood pressure causes heart disease and increases the risk of stroke. This is more likely to develop in people who have high blood pressure readings, are of African descent, or are overweight.  Have your blood pressure checked: ? Every 3-5 years if you are 18-39 years of age. ? Every year if you are 40 years old or older. Diabetes Have regular diabetes screenings. This checks your fasting blood sugar level. Have the screening done:  Once every three years after age 40 if you are at a normal weight and have a low risk for diabetes.  More often and at a younger age if you are overweight or have a high risk for diabetes. What should I know about preventing infection? Hepatitis B If you have a higher risk for hepatitis B, you should be screened for this virus. Talk with your health care provider to find out if you are at risk for hepatitis B infection. Hepatitis C Testing is recommended for:  Everyone born from 1945 through 1965.  Anyone with known risk factors for hepatitis C. Sexually transmitted infections (STIs)  Get screened for STIs, including gonorrhea and chlamydia, if: ? You are sexually active and are younger than 54 years of age. ? You are older than 54 years of age and your health care provider tells you that you are at risk for this type of infection. ? Your sexual activity has changed since you were last screened, and you are at increased risk for chlamydia or gonorrhea. Ask your health care provider   if you are at risk.  Ask your health care provider about whether you are at high risk for HIV. Your health care provider may recommend a prescription medicine to help prevent HIV infection. If you choose to take medicine to prevent HIV, you should first get tested for HIV. You should then be tested every 3 months for as long as you are taking the medicine. Pregnancy  If you are about to stop having your period (premenopausal) and  you may become pregnant, seek counseling before you get pregnant.  Take 400 to 800 micrograms (mcg) of folic acid every day if you become pregnant.  Ask for birth control (contraception) if you want to prevent pregnancy. Osteoporosis and menopause Osteoporosis is a disease in which the bones lose minerals and strength with aging. This can result in bone fractures. If you are 65 years old or older, or if you are at risk for osteoporosis and fractures, ask your health care provider if you should:  Be screened for bone loss.  Take a calcium or vitamin D supplement to lower your risk of fractures.  Be given hormone replacement therapy (HRT) to treat symptoms of menopause. Follow these instructions at home: Lifestyle  Do not use any products that contain nicotine or tobacco, such as cigarettes, e-cigarettes, and chewing tobacco. If you need help quitting, ask your health care provider.  Do not use street drugs.  Do not share needles.  Ask your health care provider for help if you need support or information about quitting drugs. Alcohol use  Do not drink alcohol if: ? Your health care provider tells you not to drink. ? You are pregnant, may be pregnant, or are planning to become pregnant.  If you drink alcohol: ? Limit how much you use to 0-1 drink a day. ? Limit intake if you are breastfeeding.  Be aware of how much alcohol is in your drink. In the [place] one drink equals one 12 oz bottle of beer (355 mL), one 5 oz glass of wine (148 mL), or one 1 oz glass of hard liquor (44 mL). General instructions  Schedule regular health, dental, and eye exams.  Stay current with your vaccines.  Tell your health care provider if: ? You often feel depressed. ? You have ever been abused or do not feel safe at home. Summary  Adopting a healthy lifestyle and getting preventive care are important in promoting health and wellness.  Follow your health care provider's instructions about healthy  diet, exercising, and getting tested or screened for diseases.  Follow your health care provider's instructions on monitoring your cholesterol and blood pressure. This information is not intended to replace advice given to you by your health care provider. Make sure you discuss any questions you have with your health care provider. Document Revised: 09/30/2018 Document Reviewed: 09/30/2018 Elsevier Patient Education  2021 Elsevier Inc.  

## 2020-12-05 NOTE — Progress Notes (Signed)
I,Yamilka Roman [employer] as a Education administrator for [employer], FNP.,have documented all relevant documentation on the behalf of [name], FNP,as directed by  [name], FNP while in the presence of [name], [place]. This visit occurred during the SARS-CoV-2 public health emergency.  Safety protocols were in place, including screening questions prior to the visit, additional usage of staff PPE, and extensive cleaning of exam room while observing appropriate contact time as indicated for disinfecting solutions.  Subjective:     Patient ID: [name] , female    DOB: 1966-02-15 , 55 y.o.   MRN: [id number]   Chief Complaint  Patient presents with  . Annual Exam    HPI  Here for hm.  She has seen Dr Johnanna Schneiders, she is to see a Hematologist in [facility] appt March 28th, he specializes in people with blood clots related to covid.  She continues to take Eliquis and I will make the decision on stopping the Eliquis.     Past Medical History:  Diagnosis Date  . 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  ECC with HPV effect  . Cancer of colon with rectum ([place]) 2012  . Diabetes mellitus without complication ([place])   . Headache(784.0)    experiences migraine every couple of weeks. takes only excedrin  . 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  colposcopy adequate normal 10/2015   . Hyperlipidemia   . Pulmonary embolism ([place])   . STD (sexually transmitted disease) 1991   POS GC-TREATED  . Thyroid disease      Family History  Problem Relation Age of Onset  . Heart disease Father   . Lung cancer Mother   . Brain cancer Mother   . Diabetes Mother   . Hypertension Mother   . Diabetes Brother   . Kidney disease Brother   . Cervical cancer Paternal Grandmother   . Breast cancer Neg Hx      Current Outpatient Medications:  .  apixaban (ELIQUIS) 5  MG TABS tablet, Take 1 tablet (5 mg total) by mouth 2 (two) times daily., Disp: 180 tablet, Rfl: 3 .  Ascorbic Acid (VITAMIN C ADULT GUMMIES PO), Take 1 tablet by mouth daily., Disp: , Rfl:  .  cholecalciferol (VITAMIN D) 400 UNITS TABS tablet, Take 400 Units by mouth daily. , Disp: , Rfl:  .  cyanocobalamin 500 MCG tablet, Take 500 mcg by mouth daily., Disp: , Rfl:  .  Insulin Lispro (HUMALOG [place]), Inject 10 Units into the skin 3 (three) times daily with meals. , Disp: , Rfl:  .  JARDIANCE 25 MG TABS tablet, Take 25 mg by mouth daily., Disp: , Rfl:  .  levothyroxine (SYNTHROID, LEVOTHROID) 100 MCG tablet, Take 100 mcg by mouth daily before breakfast. , Disp: , Rfl:  .  Multiple Vitamin (MULTIVITAMIN) tablet, Take 1 tablet by mouth daily., Disp: , Rfl:  .  simvastatin (ZOCOR) 20 MG tablet, Take 0.5 tablets (10 mg total) by mouth daily., Disp: 90 tablet, Rfl: 1 .  Zinc 100 MG TABS, Take 1 tablet by mouth daily., Disp: , Rfl:    Allergies  Allergen Reactions  . Metformin And Related Other (See Comments)    Chest pain  . Penicillins Hives      The patient states she uses post menopausal status for birth control. Last LMP was Patient's last menstrual period was  09/12/2011.. Negative for Dysmenorrhea and Negative for Menorrhagia. Negative for: breast discharge, breast lump(s), breast pain and breast self exam. Associated symptoms include abnormal vaginal bleeding. Pertinent negatives include abnormal bleeding (hematology), anxiety, decreased libido, depression, difficulty falling sleep, dyspareunia, history of infertility, nocturia, sexual dysfunction, sleep disturbances, urinary incontinence, urinary urgency, vaginal discharge and vaginal itching. Diet regular; she tries to stay away from a lot of starches. The patient states her exercise level is none    The patient's tobacco use is:  Social History   Tobacco Use  Smoking Status Never Smoker  Smokeless Tobacco Never Used   She has been  exposed to passive smoke. The patient's alcohol use is:  Social History   Substance and Sexual Activity  Alcohol Use Yes   Comment: rare- twice a year   Additional information: Last pap 09/26/2020 done by Dr. Delilah Shan, abnormal with positive HPV she is to have a 6 month follow up.    Review of Systems  Constitutional: Negative.   HENT: Negative.   Eyes: Negative.   Respiratory: Negative.   Cardiovascular: Negative.   Gastrointestinal: Negative.   Endocrine: Negative.   Genitourinary: Negative.   Musculoskeletal: Negative.   Skin: Negative.   Allergic/Immunologic: Negative.   Neurological: Negative.   Hematological: Negative.   Psychiatric/Behavioral: Negative.      Today's Vitals   12/05/20 0859  BP: 124/80  Pulse: 63  Temp: 97.9 F (36.6 C)  TempSrc: Oral  Weight: 193 lb 12.8 oz (87.9 kg)  Height: $Remove'5\' 11"'JwQcZpE$  (1.803 m)  PainSc: 0-No pain   Body mass index is 27.03 kg/m.   Objective:  Physical Exam Constitutional:      General: She is not in acute distress.    Appearance: Normal appearance. She is well-developed and normal weight.  HENT:     Head: Normocephalic and atraumatic.     Right Ear: Hearing, tympanic membrane, ear canal and external ear normal. There is no impacted cerumen.     Left Ear: Hearing, tympanic membrane, ear canal and external ear normal. There is no impacted cerumen.     Nose:     Comments: Deferred - masked    Mouth/Throat:     Comments: Deferred - masked Eyes:     General: Lids are normal.     Extraocular Movements: Extraocular movements intact.     Conjunctiva/sclera: Conjunctivae normal.     Pupils: Pupils are equal, round, and reactive to light.     Funduscopic exam:    Right eye: No papilledema.        Left eye: No papilledema.  Neck:     Thyroid: No thyroid mass.     Vascular: No carotid bruit.  Cardiovascular:     Rate and Rhythm: Normal rate and regular rhythm.     Pulses: Normal pulses.     Heart sounds: Normal heart sounds.  No murmur heard.   Pulmonary:     Effort: Pulmonary effort is normal.     Breath sounds: Normal breath sounds.  Chest:     Chest wall: No mass.  Breasts:     Tanner Score is 5.     Right: Normal. No mass, tenderness, axillary adenopathy or supraclavicular adenopathy.     Left: Normal. No mass, tenderness, axillary adenopathy or supraclavicular adenopathy.    Abdominal:     General: Abdomen is flat. Bowel sounds are normal. There is no distension.     Palpations: Abdomen is soft.     Tenderness: There is no abdominal  tenderness.  Genitourinary:    Rectum: Guaiac result negative.  Musculoskeletal:        General: No swelling. Normal range of motion.     Cervical back: Full passive range of motion without pain, normal range of motion and neck supple.     Right lower leg: No edema.     Left lower leg: No edema.  Lymphadenopathy:     Upper Body:     Right upper body: No supraclavicular, axillary or pectoral adenopathy.     Left upper body: No supraclavicular, axillary or pectoral adenopathy.  Skin:    General: Skin is warm and dry.     Capillary Refill: Capillary refill takes less than 2 seconds.  Neurological:     General: No focal deficit present.     Mental Status: She is alert and oriented to person, place, and time.     Cranial Nerves: No cranial nerve deficit.     Sensory: No sensory deficit.  Psychiatric:        Mood and Affect: Mood normal.        Behavior: Behavior normal.        Thought Content: Thought content normal.        Judgment: Judgment normal.         Assessment And Plan:     1. Encounter for general adult medical examination w/o abnormal findings . Behavior modifications discussed and diet history reviewed.   . Pt will continue to exercise regularly and modify diet with low GI, plant based foods and decrease intake of processed foods.  . Recommend intake of daily multivitamin, Vitamin D, and calcium.  . Recommend for preventive screenings, as well as  recommend immunizations that include influenza, TDAP - CMP14+EGFR - CBC  2. Type 2 diabetes mellitus without complication, without long-term current use of insulin (HCC)  Chronic, stable  Continue with current medications  Encouraged to limit intake of sugary foods and drinks  Encouraged to increase physical activity to 150 minutes per week  Diabetic foot exam done, no abnormal findings  Eye exam is up to date - POCT Urinalysis Dipstick (81002) - POCT UA - Microalbumin - EKG 12-Lead - Hemoglobin A1c  3. Mixed hyperlipidemia  Chronic, controlled  Continue with current medications, tolerating well - CMP14+EGFR - Lipid panel  4. Hypothyroidism, unspecified type  Chronic, controlled  Continue with current medications - TSH - T3, free - T4  5. History of pulmonary embolism  Will recheck CT chest to reevaluate if she still needs to continue with Eliquis  6. History of DVT (deep vein thrombosis)  Will recheck doppler ultrasound  7. Other urinary incontinence  Chronic, stable  If worse she will make me aware  8. Tingling of skin  Will check vitamin b12  Encouraged to stay well hydrated with water - Vitamin B12  9. Encounter for hepatitis C screening test for low risk patient  Will check Hepatitis C screening due to recent recommendations to screen all adults 18 years and older - Hepatitis C antibody  10. Asymptomatic microscopic hematuria - Culture, Urine     Patient was given opportunity to ask questions. Patient verbalized understanding of the plan and was able to repeat key elements of the plan. All questions were answered to their satisfaction.   [name], FNP   I, [name], FNP, have reviewed all documentation for this visit. The documentation on 12/27/20 for the exam, diagnosis, procedures, and orders are all accurate and complete.   THE PATIENT  IS ENCOURAGED TO PRACTICE SOCIAL DISTANCING DUE TO THE COVID-19 PANDEMIC.

## 2020-12-06 LAB — HEMOGLOBIN A1C
Est. average glucose Bld gHb Est-mCnc: 194 mg/dL
Hgb A1c MFr Bld: 8.4 % — ABNORMAL HIGH (ref 4.8–5.6)

## 2020-12-06 LAB — CBC
Hematocrit: 45.6 % (ref 34.0–46.6)
Hemoglobin: 15.2 g/dL (ref 11.1–15.9)
MCH: 30.2 pg (ref 26.6–33.0)
MCHC: 33.3 g/dL (ref 31.5–35.7)
MCV: 91 fL (ref 79–97)
Platelets: 229 10*3/uL (ref 150–450)
RBC: 5.04 x10E6/uL (ref 3.77–5.28)
RDW: 12.3 % (ref 11.7–15.4)
WBC: 3.3 10*3/uL — ABNORMAL LOW (ref 3.4–10.8)

## 2020-12-06 LAB — LIPID PANEL
Chol/HDL Ratio: 4 ratio (ref 0.0–4.4)
Cholesterol, Total: 190 mg/dL (ref 100–199)
HDL: 48 mg/dL (ref >39)
LDL Chol Calc (NIH): 115 mg/dL — ABNORMAL HIGH (ref 0–99)
Triglycerides: 152 mg/dL — ABNORMAL HIGH (ref 0–149)
VLDL Cholesterol Cal: 27 mg/dL (ref 5–40)

## 2020-12-06 LAB — CMP14+EGFR
ALT: 23 IU/L (ref 0–32)
AST: 18 IU/L (ref 0–40)
Albumin/Globulin Ratio: 1.4 (ref 1.2–2.2)
Albumin: 4.5 g/dL (ref 3.8–4.9)
Alkaline Phosphatase: 101 IU/L (ref 44–121)
BUN/Creatinine Ratio: 15 (ref 9–23)
BUN: 14 mg/dL (ref 6–24)
Bilirubin Total: 0.4 mg/dL (ref 0.0–1.2)
CO2: 23 mmol/L (ref 20–29)
Calcium: 10 mg/dL (ref 8.7–10.2)
Chloride: 102 mmol/L (ref 96–106)
Creatinine, Ser: 0.93 mg/dL (ref 0.57–1.00)
GFR calc Af Amer: 81 mL/min/{1.73_m2} (ref >59)
GFR calc non Af Amer: 70 mL/min/{1.73_m2} (ref >59)
Globulin, Total: 3.2 g/dL (ref 1.5–4.5)
Glucose: 155 mg/dL — ABNORMAL HIGH (ref 65–99)
Potassium: 4.7 mmol/L (ref 3.5–5.2)
Sodium: 141 mmol/L (ref 134–144)
Total Protein: 7.7 g/dL (ref 6.0–8.5)

## 2020-12-06 LAB — TSH: TSH: 1.86 u[IU]/mL (ref 0.450–4.500)

## 2020-12-06 LAB — VITAMIN B12: Vitamin B-12: 484 pg/mL (ref 232–1245)

## 2020-12-06 LAB — T4: T4, Total: 9.1 ug/dL (ref 4.5–12.0)

## 2020-12-06 LAB — HEPATITIS C ANTIBODY: Hep C Virus Ab: <0.1 s/co ratio (ref 0.0–0.9)

## 2020-12-06 LAB — T3, FREE: T3, Free: 2.6 pg/mL (ref 2.0–4.4)

## 2020-12-27 ENCOUNTER — Encounter (HOSPITAL_COMMUNITY): Payer: Self-pay | Admitting: Emergency Medicine

## 2020-12-27 ENCOUNTER — Emergency Department (HOSPITAL_COMMUNITY): Payer: No Typology Code available for payment source

## 2020-12-27 ENCOUNTER — Other Ambulatory Visit: Payer: Self-pay

## 2020-12-27 ENCOUNTER — Ambulatory Visit (INDEPENDENT_AMBULATORY_CARE_PROVIDER_SITE_OTHER): Payer: No Typology Code available for payment source | Admitting: Nurse Practitioner

## 2020-12-27 ENCOUNTER — Emergency Department (HOSPITAL_COMMUNITY)
Admission: EM | Admit: 2020-12-27 | Discharge: 2020-12-27 | Disposition: A | Discharge status: Home / Self Care | Payer: No Typology Code available for payment source | Attending: Emergency Medicine | Admitting: Emergency Medicine

## 2020-12-27 ENCOUNTER — Encounter: Payer: Self-pay | Admitting: Nurse Practitioner

## 2020-12-27 VITALS — Temp 97.3°F | Ht 64.2 in | Wt 192.6 lb

## 2020-12-27 DIAGNOSIS — Z79899 Other long term (current) drug therapy: Secondary | ICD-10-CM | POA: Diagnosis not present

## 2020-12-27 DIAGNOSIS — E661 Drug-induced obesity: Secondary | ICD-10-CM

## 2020-12-27 DIAGNOSIS — Z7901 Long term (current) use of anticoagulants: Secondary | ICD-10-CM | POA: Insufficient documentation

## 2020-12-27 DIAGNOSIS — E119 Type 2 diabetes mellitus without complications: Secondary | ICD-10-CM | POA: Diagnosis not present

## 2020-12-27 DIAGNOSIS — Z85038 Personal history of other malignant neoplasm of large intestine: Secondary | ICD-10-CM | POA: Insufficient documentation

## 2020-12-27 DIAGNOSIS — Z86711 Personal history of pulmonary embolism: Secondary | ICD-10-CM

## 2020-12-27 DIAGNOSIS — Z7984 Long term (current) use of oral hypoglycemic drugs: Secondary | ICD-10-CM | POA: Insufficient documentation

## 2020-12-27 DIAGNOSIS — Z6832 Body mass index (BMI) 32.0-32.9, adult: Secondary | ICD-10-CM

## 2020-12-27 DIAGNOSIS — Z794 Long term (current) use of insulin: Secondary | ICD-10-CM | POA: Diagnosis not present

## 2020-12-27 DIAGNOSIS — N390 Urinary tract infection, site not specified: Secondary | ICD-10-CM | POA: Insufficient documentation

## 2020-12-27 DIAGNOSIS — Z8616 Personal history of COVID-19: Secondary | ICD-10-CM | POA: Insufficient documentation

## 2020-12-27 DIAGNOSIS — R531 Weakness: Secondary | ICD-10-CM | POA: Diagnosis not present

## 2020-12-27 DIAGNOSIS — R42 Dizziness and giddiness: Secondary | ICD-10-CM | POA: Diagnosis not present

## 2020-12-27 DIAGNOSIS — Z86718 Personal history of other venous thrombosis and embolism: Secondary | ICD-10-CM

## 2020-12-27 DIAGNOSIS — E039 Hypothyroidism, unspecified: Secondary | ICD-10-CM | POA: Insufficient documentation

## 2020-12-27 LAB — CBC
HCT: 45.1 % (ref 36.0–46.0)
Hemoglobin: 14.6 g/dL (ref 12.0–15.0)
MCH: 30 pg (ref 26.0–34.0)
MCHC: 32.4 g/dL (ref 30.0–36.0)
MCV: 92.8 fL (ref 80.0–100.0)
Platelets: 203 10*3/uL (ref 150–400)
RBC: 4.86 MIL/uL (ref 3.87–5.11)
RDW: 11.9 % (ref 11.5–15.5)
WBC: 3.9 10*3/uL — ABNORMAL LOW (ref 4.0–10.5)
nRBC: 0 % (ref 0.0–0.2)

## 2020-12-27 LAB — URINALYSIS, ROUTINE W REFLEX MICROSCOPIC
Bilirubin Urine: NEGATIVE
Glucose, UA: >=500 mg/dL — AB
Ketones, ur: NEGATIVE mg/dL
Leukocytes,Ua: NEGATIVE
Nitrite: NEGATIVE
Protein, ur: NEGATIVE mg/dL
Specific Gravity, Urine: 1.026 (ref 1.005–1.030)
pH: 5 (ref 5.0–8.0)

## 2020-12-27 LAB — BASIC METABOLIC PANEL
Anion gap: 10 (ref 5–15)
BUN: 14 mg/dL (ref 6–20)
CO2: 24 mmol/L (ref 22–32)
Calcium: 10 mg/dL (ref 8.9–10.3)
Chloride: 105 mmol/L (ref 98–111)
Creatinine, Ser: 1 mg/dL (ref 0.44–1.00)
GFR, Estimated: >60 mL/min (ref >60)
Glucose, Bld: 140 mg/dL — ABNORMAL HIGH (ref 70–99)
Potassium: 4.6 mmol/L (ref 3.5–5.1)
Sodium: 139 mmol/L (ref 135–145)

## 2020-12-27 LAB — CBG MONITORING, ED: Glucose-Capillary: 91 mg/dL (ref 70–99)

## 2020-12-27 LAB — I-STAT BETA HCG BLOOD, ED (MC, WL, AP ONLY): I-stat hCG, quantitative: <5 m[IU]/mL (ref <5)

## 2020-12-27 MED ORDER — CEPHALEXIN 500 MG PO CAPS: 500.0000 mg | ORAL_CAPSULE | Freq: Three times a day (TID) | ORAL | 0 refills | Status: DC

## 2020-12-27 MED ORDER — SODIUM CHLORIDE 0.9 % IV SOLN
1.0000 g | Freq: Once | INTRAVENOUS | Status: AC
  Administered 2020-12-27: 1 g via INTRAVENOUS
  Filled 2020-12-27: qty 10

## 2020-12-27 MED ORDER — ONDANSETRON 4 MG PO TBDP: 4.0000 mg | ORAL_TABLET | Freq: Three times a day (TID) | ORAL | 0 refills | Status: DC | PRN

## 2020-12-27 NOTE — ED Provider Notes (Signed)
[facility] EMERGENCY DEPARTMENT Provider Note   CSN: [id number] Arrival date & time: 12/27/20  1247     History Chief Complaint  Patient presents with  . Dizziness    [name] is a 55 y.o. female.  The history is provided by the patient and medical records.   [name] is a 55 y.o. female who presents to the Emergency Department complaining of dizzy. Patient with history of colon cancer status post resection currently in remission as well as history of PE currently on anticoagulation here for evaluation after waking up at 4 AM feeling nauseated, dizzy and lightheaded. She attempted to check her blood sugar and had difficulty checking it. She required her husband to assist her. Her husband was able to check her blood sugar and it was 182. She felt unsteady when walking. She feels like the room is spinning and she is shaky and also feels like she might pass out. She denies any headache, chest pain, shortness of breath, abdominal pain, vomiting, dysuria, numbness, weakness. No prior similar symptoms.       Past Medical History:  Diagnosis Date  . 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  ECC with HPV effect  . 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 Diabetes mellitus without complication ([place])   . Headache(784.0)    experiences migraine every couple of weeks. takes only excedrin  . 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  colposcopy adequate normal 10/2015   . Hyperlipidemia   . Pulmonary embolism ([place])   . STD (sexually transmitted disease) 1991   POS GC-TREATED  . Thyroid disease     Patient Active Problem List   Diagnosis Date Noted  . Sepsis due to COVID-19 ([place]) 03/22/2020  . Type 2 diabetes mellitus without complication, without long-term current use of insulin ([place]) 12/03/2018  . Hypothyroidism  07/08/2018  . Bilateral calf pain 06/08/2015  . Chest pain 08/22/2014  . Hyperlipidemia 07/13/2014  . Family history of hypertrophic cardiomyopathy 07/13/2014  . DOE (dyspnea on exertion) 07/13/2014  . Diabetes ([place]) 11/01/2013  . Adiposity 11/01/2013  . 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 Obesity 11/01/2013  . Diabetes mellitus   . Malignant neoplasm of rectum ([place]) 08/29/2011    Past Surgical History:  Procedure Laterality Date  . AORTA - BILATERAL FEMORAL ARTERY BYPASS GRAFT N/A 03/29/2020   Procedure: VENO VENOUS BYPASS;  Surgeon: Waynetta Sandy, MD;  Location: [facility];  Service: Vascular;  Laterality: N/A;  . APPLICATION OF [place] N/A 03/29/2020   Procedure: [facility] MECHANICAL THROMBECTOMY;  Surgeon: Waynetta Sandy, MD;  Location: [facility];  Service: Vascular;  Laterality: N/A;  . CESAREAN SECTION  2005  . COLON SURGERY    . ENDOVASCULAR STENT INSERTION N/A 03/29/2020   Procedure: Inferior Vena Cava WallStent Graft Insertion;  Surgeon: Waynetta Sandy, MD;  Location: [facility];  Service: Vascular;  Laterality: N/A;  . INSERTION OF VENA CAVA FILTER Right 2012  . none    . PORT-A-CATH REMOVAL Right 12/22/2012   Procedure: REMOVAL PORT-A-CATH;  Surgeon: Odis Hollingshead, MD;  Location: [facility];  Service: General;  Laterality: Right;  . PORTACATH PLACEMENT  02/21/11   tip in mid [id number]  . 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 THROMBECTOMY ILIAC ARTERY N/A 03/29/2020   Procedure: THROMBECTOMY ILIAC  ARTERY MECHANICAL THROMBECTOMY;  Surgeon: Waynetta Sandy, MD;  Location: [facility] OR;  Service: Vascular;  Laterality: N/A;     OB History    Gravida  6   Para  3   Term      Preterm      AB  3   Living  3     SAB      IAB      Ectopic      Multiple      Live Births              Family History  Problem Relation Age of Onset  . Heart disease Father   . Lung cancer Mother   . Brain cancer Mother    . Diabetes Mother   . Hypertension Mother   . Diabetes Brother   . Kidney disease Brother   . Cervical cancer Paternal Grandmother   . Breast cancer Neg Hx     Social History   Tobacco Use  . Smoking status: Never Smoker  . Smokeless tobacco: Never Used  Substance Use Topics  . Alcohol use: Yes    Comment: rare- twice a year  . Drug use: No    Home Medications Prior to Admission medications   Medication Sig Start Date End Date Taking? Authorizing Provider  cephALEXin (KEFLEX) 500 MG capsule Take 1 capsule (500 mg total) by mouth 3 (three) times daily. 12/27/20  Yes Quintella Reichert, MD  ondansetron (ZOFRAN ODT) 4 MG disintegrating tablet Take 1 tablet (4 mg total) by mouth every 8 (eight) hours as needed for nausea or vomiting. 12/27/20  Yes Quintella Reichert, MD  apixaban (ELIQUIS) 5 MG TABS tablet Take 1 tablet (5 mg total) by mouth 2 (two) times daily. 04/19/20   [name], FNP  Ascorbic Acid (VITAMIN C ADULT GUMMIES PO) Take 1 tablet by mouth daily. 10/22/19   [provider]  cholecalciferol (VITAMIN D) 400 UNITS TABS tablet Take 400 Units by mouth daily.     [provider]  cyanocobalamin 500 MCG tablet Take 500 mcg by mouth daily.    [provider]  Insulin Lispro (HUMALOG [place]) Inject 10 Units into the skin 3 (three) times daily with meals.     [provider]  JARDIANCE 25 MG TABS tablet Take 25 mg by mouth daily. 01/01/20   [provider]  levothyroxine (SYNTHROID, LEVOTHROID) 100 MCG tablet Take 100 mcg by mouth daily before breakfast.  07/13/15   [provider]  Multiple Vitamin (MULTIVITAMIN) tablet Take 1 tablet by mouth daily.    [provider]  simvastatin (ZOCOR) 20 MG tablet Take 0.5 tablets (10 mg total) by mouth daily. 04/19/20   [name], FNP  Zinc 100 MG TABS Take 1 tablet by mouth daily. 10/22/19   [provider]  POTASSIUM CHLORIDE PO Take 20 mEq by mouth daily.   04/19/20  [provider]    Allergies    Metformin and related and Penicillins  Review of Systems   Review of Systems  All other systems reviewed and are negative.   Physical Exam Updated Vital Signs BP 131/84   Pulse 62   Temp 98.1 F (36.7 C) (Oral)   Resp 19   Ht 5\' 11"  (1.803 m)   Wt 87.1 kg   LMP 09/12/2011   SpO2 100%   BMI 26.78 kg/m   Physical Exam Vitals and nursing note reviewed.  Constitutional:      Appearance: She is  well-developed and well-nourished.  HENT:     Head: Normocephalic and atraumatic.  Eyes:     Extraocular Movements: Extraocular movements intact.     Pupils: Pupils are equal, round, and reactive to light.  Cardiovascular:     Rate and Rhythm: Normal rate and regular rhythm.     Heart sounds: No murmur heard.   Pulmonary:     Effort: Pulmonary effort is normal. No respiratory distress.     Breath sounds: Normal breath sounds.  Abdominal:     Palpations: Abdomen is soft.     Tenderness: There is no abdominal tenderness. There is no guarding or rebound.  Musculoskeletal:        General: No tenderness or edema.  Skin:    General: Skin is warm and dry.  Neurological:     Mental Status: She is alert and oriented to person, place, and time.     Comments: Five out of five strength in all four extremities with sensation light touch intact in all four extremities. Visual fields grossly intact. No ataxia on finger to nose bilaterally. Normal gait.  Psychiatric:        Mood and Affect: Mood and affect normal.        Behavior: Behavior normal.     ED Results / Procedures / Treatments   Labs (all labs ordered are listed, but only abnormal results are displayed) Labs Reviewed  BASIC METABOLIC PANEL - Abnormal; Notable for the following components:      Result Value   Glucose, Bld 140 (*)    All other components within normal limits  CBC - Abnormal; Notable for the following components:   WBC 3.9 (*)    All other components within normal limits   URINALYSIS, ROUTINE W REFLEX MICROSCOPIC - Abnormal; Notable for the following components:   APPearance HAZY (*)    Glucose, UA >=500 (*)    Hgb urine dipstick SMALL (*)    Bacteria, UA MANY (*)    All other components within normal limits  URINE CULTURE  CBG MONITORING, ED  I-STAT BETA HCG BLOOD, ED ([facility], [facility], [facility] ONLY)    EKG EKG Interpretation  Date/Time:  Wednesday December 27 2020 13:31:24 EST Ventricular Rate:  62 PR Interval:  140 QRS Duration: 78 QT Interval:  404 QTC Calculation: 410 R Axis:   61 Text Interpretation: Normal sinus rhythm Normal ECG Confirmed by Quintella Reichert [phone]) on 12/27/2020 4:02:21 PM   Radiology CT HEAD WO CONTRAST  Result Date: 12/27/2020 CLINICAL DATA:  Headache and dizziness EXAM: CT HEAD WITHOUT CONTRAST TECHNIQUE: Contiguous axial images were obtained from the base of the skull through the vertex without intravenous contrast. COMPARISON:  None. FINDINGS: Brain: Ventricles and sulci are normal in size and configuration. There is no intracranial mass, hemorrhage, extra-axial fluid collection, or midline shift. Brain parenchyma appears unremarkable. No evident acute infarct. Vascular: No hyperdense vessel.  No evident vascular calcification. Skull: Bony calvarium appears intact. Sinuses/Orbits: Visualized paranasal sinuses are clear. Concha bullosa noted on each side, an anatomic variant. Visualized orbits appear symmetric bilaterally. Other: Mastoid air cells are clear. IMPRESSION: Study within normal limits. Electronically Signed   By: Lowella Grip III M.D.   On: 12/27/2020 14:11   MR BRAIN WO CONTRAST  Result Date: 12/27/2020 CLINICAL DATA:  Dizziness EXAM: MRI HEAD WITHOUT CONTRAST TECHNIQUE: Multiplanar, multiecho pulse sequences of the brain and surrounding structures were obtained without intravenous contrast. COMPARISON:  None. FINDINGS: Brain: No acute infarct, mass effect or extra-axial collection.  No acute or chronic hemorrhage. Normal white  matter signal, parenchymal volume and CSF spaces. The midline structures are normal. Vascular: Major flow voids are preserved. Skull and upper cervical spine: Normal calvarium and skull base. Visualized upper cervical spine and soft tissues are normal. Sinuses/Orbits:No paranasal sinus fluid levels or advanced mucosal thickening. No mastoid or middle ear effusion. Normal orbits. IMPRESSION: Normal brain MRI. Electronically Signed   By: Ulyses Jarred M.D.   On: 12/27/2020 20:48    Procedures Procedures   Medications Ordered in ED Medications  cefTRIAXone (ROCEPHIN) 1 g in sodium chloride 0.9 % 100 mL IVPB (0 g Intravenous Stopped 12/27/20 1951)    ED Course  I have reviewed the triage vital signs and the nursing notes.  Pertinent labs & imaging results that were available during my care of the patient were reviewed by me and considered in my medical decision making (see chart for details).    MDM Rules/Calculators/A&P                         patient with history of colon cancer in remission, DVT and PE on anticoagulation as well as diabetes here for evaluation of dizziness, unsteady gait and nausea. She is non-toxic appearing on evaluation with no focal neurologic deficits. CT is negative for acute process. Due to dizziness and gait difficulties prior to arrival MRI obtained to rule out CVA. MRI is negative for stroke. Urinalysis is consistent with UTI. She does not have any active dysuria. Given rapid progression of her symptoms will treat with Rocephin and 10 day course of Keflex. Presentation is not consistent with sepsis. Discussed with patient home care for UTI. Discussed outpatient follow-up and return precautions.  Final Clinical Impression(s) / ED Diagnoses Final diagnoses:  Acute UTI  Dizziness    Rx / DC Orders ED Discharge Orders         Ordered    cephALEXin (KEFLEX) 500 MG capsule  3 times daily        12/27/20 1817    ondansetron (ZOFRAN ODT) 4 MG disintegrating tablet   Every 8 hours PRN        12/27/20 2115           Quintella Reichert, MD 12/27/20 2156

## 2020-12-27 NOTE — ED Triage Notes (Signed)
Onset middle of the night woke up 0300-0400 has dizziness and feeling light headed. Took blood sugar 182 states history of diabetes. States general weakness and confused briefly. Seen at [place] office sent to the ED for evaluation. On Eliquis for blood clots in legs and lungs since last year. Alert answering and following commands appropriate.

## 2020-12-27 NOTE — Progress Notes (Signed)
I,Tianna Badgett,acting as a Education administrator for [employer], NP.,have documented all relevant documentation on the behalf of [employer], NP,as directed by  Bary Castilla, NP while in the presence of Bary Castilla, NP.  This visit occurred during the SARS-CoV-2 public health emergency.  Safety protocols were in place, including screening questions prior to the visit, additional usage of staff PPE, and extensive cleaning of exam room while observing appropriate contact time as indicated for disinfecting solutions.  Subjective:     Patient ID: [name] , female    DOB: [birth year] , 55 y.o.   MRN: [id number]   Chief Complaint  Patient presents with  . Dizziness    HPI  Patient is here with her husband with complaint of dizziness. Her symptoms started at 4am this morning. She has a history of vertigo but says she has never had symptoms this bad. She does have some ringing of the ear.  She has been experiencing fatigue for weeks. Denies fainting. She sees hematologist and oncologist. Family had [place] in June. SE of blood clot in her legs. Pain in her legs. 2012 colon/rectal cancer. She also had a PE.  She has an appt in hematologist in [place] march 28th. She also had trouble walking this morning. She has weakness on one side of her body.  No chest pain or shortness of breath. No loss of vision although she mentions she did have a sharp pain behind her eye for couple of weeks. She has a appt with a eye doctor tomorrow. She checked her blood sugar this morning and it was 182. She did not check her BP at home with this episode.   Dizziness This is a new problem. The current episode started today. The problem occurs constantly. The problem has been gradually improving. Associated symptoms include chills, fatigue, nausea, vertigo and weakness. Pertinent negatives include no chest pain, coughing, fever or vomiting. The symptoms are aggravated by standing. The treatment provided no  relief.     Past Medical History:  Diagnosis Date  . 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  ECC with HPV effect  . 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 Diabetes mellitus without complication ([place])   . Headache(784.0)    experiences migraine every couple of weeks. takes only excedrin  . 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  colposcopy adequate normal 10/2015   . Hyperlipidemia   . Pulmonary embolism ([place])   . STD (sexually transmitted disease) 1991   POS GC-TREATED  . Thyroid disease      Family History  Problem Relation Age of Onset  . Heart disease Father   . Lung cancer Mother   . Brain cancer Mother   . Diabetes Mother   . Hypertension Mother   . Diabetes Brother   . Kidney disease Brother   . Cervical cancer Paternal Grandmother   . Breast cancer Neg Hx      Current Outpatient Medications:  .  apixaban (ELIQUIS) 5 MG TABS tablet, Take 1 tablet (5 mg total) by mouth 2 (two) times daily., Disp: 180 tablet, Rfl: 3 .  Ascorbic Acid (VITAMIN C ADULT GUMMIES PO), Take 1 tablet by mouth daily., Disp: , Rfl:  .  cholecalciferol (VITAMIN D) 400 UNITS TABS tablet, Take 400 Units by mouth daily. , Disp: , Rfl:  .  cyanocobalamin 500 MCG tablet,  Take 500 mcg by mouth daily., Disp: , Rfl:  .  Insulin Lispro (HUMALOG [place]), Inject 10 Units into the skin 3 (three) times daily with meals. , Disp: , Rfl:  .  JARDIANCE 25 MG TABS tablet, Take 25 mg by mouth daily., Disp: , Rfl:  .  levothyroxine (SYNTHROID, LEVOTHROID) 100 MCG tablet, Take 100 mcg by mouth daily before breakfast. , Disp: , Rfl:  .  Multiple Vitamin (MULTIVITAMIN) tablet, Take 1 tablet by mouth daily., Disp: , Rfl:  .  simvastatin (ZOCOR) 20 MG tablet, Take 0.5 tablets (10 mg total) by mouth daily., Disp: 90 tablet, Rfl: 1 .  Zinc 100 MG TABS, Take 1 tablet by mouth daily.,  Disp: , Rfl:    Allergies  Allergen Reactions  . Metformin And Related Other (See Comments)    Chest pain  . Penicillins Hives     Review of Systems  Constitutional: Positive for chills and fatigue. Negative for fever.  HENT: Positive for tinnitus.        Ringing in her ears  Eyes: Positive for pain. Negative for visual disturbance.  Respiratory: Negative.  Negative for cough, chest tightness, shortness of breath and wheezing.   Cardiovascular: Positive for palpitations. Negative for chest pain.  Gastrointestinal: Positive for nausea. Negative for vomiting.  Musculoskeletal: Positive for gait problem.       Weakness in legs   Neurological: Positive for dizziness, vertigo, weakness and light-headedness. Negative for speech difficulty.  Psychiatric/Behavioral: Negative for confusion.     Today's Vitals   12/27/20 1141  Temp: (!) 97.3 F (36.3 C)  TempSrc: Oral  Weight: 192 lb 9.6 oz (87.4 kg)  Height: 5' 4.2" (1.631 m)   Body mass index is 32.85 kg/m.   Objective:  Physical Exam Vitals reviewed.  Constitutional:      Appearance: Normal appearance. She is obese. She is ill-appearing.  HENT:     Head: Normocephalic and atraumatic.  Cardiovascular:     Rate and Rhythm: Normal rate and regular rhythm.     Pulses: Normal pulses.     Heart sounds: Normal heart sounds. No murmur heard.   Pulmonary:     Effort: Pulmonary effort is normal. No respiratory distress.     Breath sounds: Normal breath sounds. No wheezing.  Musculoskeletal:     Cervical back: Normal range of motion and neck supple.  Skin:    Capillary Refill: Capillary refill takes less than 2 seconds.  Neurological:     Mental Status: She is alert and oriented to person, place, and time.     Motor: Weakness present.     Gait: Gait abnormal.     Comments: Patient's gait was abnormal.   Psychiatric:        Mood and Affect: Mood normal.        Behavior: Behavior normal.        Thought Content: Thought  content normal.        Judgment: Judgment normal.        Assessment And Plan:     1. Dizziness - POCT Urinalysis Dipstick (81002) -Will   2. Weakness of one side of body -Patient was evaluated in office and due to her history of PE, DVT was sent to the ED for further evaluation for stroke.   3. History of DVT (deep vein thrombosis) -Due to her history of DVT, patient was sent to the ED for further evaluation and Ultrasound.   4. History of pulmonary embolism -Due to history of  PE, the patient was sent to the ED for further evaluation.   5. Class 1 drug-induced obesity with serious comorbidity and body mass index (BMI) of 32.0 to 32.9 in adult  Patient was sent to the ED with her husband due to having a history cancer, PE and DVT and symptoms and further evaluation, CT, blood work and IV fluids.   Patient was given opportunity to ask questions. Patient verbalized understanding of the plan and was able to repeat key elements of the plan. All questions were answered to their satisfaction.  Bary Castilla, NP   I, Bary Castilla, NP, have reviewed all documentation for this visit. The documentation on 12/27/20 for the exam, diagnosis, procedures, and orders are all accurate and complete.  THE PATIENT IS ENCOURAGED TO PRACTICE SOCIAL DISTANCING DUE TO THE COVID-19 PANDEMIC.

## 2020-12-27 NOTE — Patient Instructions (Signed)

## 2020-12-28 LAB — HM DIABETES EYE EXAM

## 2020-12-30 LAB — URINE CULTURE: Culture: >=100000 — AB

## 2021-01-01 ENCOUNTER — Encounter: Payer: Self-pay | Admitting: Nurse Practitioner

## 2021-01-01 DIAGNOSIS — E113299 Type 2 diabetes mellitus with mild nonproliferative diabetic retinopathy without macular edema, unspecified eye: Secondary | ICD-10-CM

## 2021-01-01 HISTORY — DX: Type 2 diabetes mellitus with mild nonproliferative diabetic retinopathy without macular edema, unspecified eye: E11.3299

## 2021-01-10 ENCOUNTER — Other Ambulatory Visit: Payer: Self-pay

## 2021-01-10 ENCOUNTER — Ambulatory Visit (HOSPITAL_COMMUNITY)
Admission: RE | Admit: 2021-01-10 | Discharge: 2021-01-10 | Disposition: A | Discharge status: Home / Self Care | Payer: No Typology Code available for payment source | Source: Ambulatory Visit | Attending: Nurse Practitioner | Admitting: Nurse Practitioner

## 2021-01-10 DIAGNOSIS — Z86718 Personal history of other venous thrombosis and embolism: Secondary | ICD-10-CM | POA: Insufficient documentation

## 2021-01-13 ENCOUNTER — Other Ambulatory Visit: Payer: Self-pay | Admitting: Nurse Practitioner

## 2021-01-13 DIAGNOSIS — E782 Mixed hyperlipidemia: Secondary | ICD-10-CM

## 2021-01-20 ENCOUNTER — Other Ambulatory Visit: Payer: Self-pay

## 2021-01-20 ENCOUNTER — Ambulatory Visit
Admission: RE | Admit: 2021-01-20 | Discharge: 2021-01-20 | Disposition: A | Discharge status: Home / Self Care | Payer: No Typology Code available for payment source | Source: Ambulatory Visit | Attending: Nurse Practitioner | Admitting: Nurse Practitioner

## 2021-01-20 DIAGNOSIS — Z86711 Personal history of pulmonary embolism: Secondary | ICD-10-CM

## 2021-02-18 ENCOUNTER — Encounter: Payer: Self-pay | Admitting: Nurse Practitioner

## 2021-02-19 ENCOUNTER — Other Ambulatory Visit: Payer: Self-pay

## 2021-02-19 ENCOUNTER — Encounter: Payer: Self-pay | Admitting: Nurse Practitioner

## 2021-02-19 ENCOUNTER — Ambulatory Visit (INDEPENDENT_AMBULATORY_CARE_PROVIDER_SITE_OTHER): Payer: No Typology Code available for payment source | Admitting: Nurse Practitioner

## 2021-02-19 VITALS — BP 122/80 | HR 95 | Temp 98.0°F | Ht 71.0 in | Wt 192.0 lb

## 2021-02-19 DIAGNOSIS — H1033 Unspecified acute conjunctivitis, bilateral: Secondary | ICD-10-CM | POA: Diagnosis not present

## 2021-02-19 DIAGNOSIS — E119 Type 2 diabetes mellitus without complications: Secondary | ICD-10-CM

## 2021-02-19 MED ORDER — ERYTHROMYCIN 5 MG/GM OP OINT: 1.0000 "application " | TOPICAL_OINTMENT | Freq: Three times a day (TID) | OPHTHALMIC | 0 refills | Status: DC

## 2021-02-19 NOTE — Progress Notes (Signed)
I,Yamilka [employer] as a Education administrator for [employer], FNP.,have documented all relevant documentation on the behalf of [name], FNP,as directed by  [name], FNP while in the presence of [name], [place]. This visit occurred during the SARS-CoV-2 public health emergency.  Safety protocols were in place, including screening questions prior to the visit, additional usage of staff PPE, and extensive cleaning of exam room while observing appropriate contact time as indicated for disinfecting solutions.  Subjective:     Patient ID: [name] , female    DOB: 05/07/1966 , 55 y.o.   MRN: [id number]   Chief Complaint  Patient presents with  . Itchy Eye    Patient stated she thinks she has pink eye. Her symptoms started on Friday. Her eyes are itchy, red and knatted shut when she wakes up in the mornings    HPI  Patient presents today for pink eye. Her symptoms started on Friday. Her eyes have been itchy,red and knatted shut in the mornings. She has had some mild symptoms of environmental allergies. She had cold symptoms on Thursday and Friday.  Yesterday slightly pink on left eye and right eye is red today.  She has been using pataday drops which then worsened the next day.  Her husband had been sick and he took a covid test which was negative. She did not do a covid test.     Past Medical History:  Diagnosis Date  . 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  ECC with HPV effect  . 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 Diabetes mellitus without complication ([place])   . Headache(784.0)    experiences migraine every couple of weeks. takes only excedrin  . 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  colposcopy adequate normal 10/2015   . Hyperlipidemia   . 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 Pulmonary  embolism ([place])   . STD (sexually transmitted disease) 1991   POS GC-TREATED  . Thyroid disease      Family History  Problem Relation Age of Onset  . Heart disease Father   . Lung cancer Mother   . Brain cancer Mother   . Diabetes Mother   . Hypertension Mother   . Diabetes Brother   . Kidney disease Brother   . Cervical cancer Paternal Grandmother   . Breast cancer Neg Hx      Current Outpatient Medications:  .  apixaban (ELIQUIS) 5 MG TABS tablet, Take 1 tablet (5 mg total) by mouth 2 (two) times daily., Disp: 180 tablet, Rfl: 3 .  Ascorbic Acid (VITAMIN C ADULT GUMMIES PO), Take 1 tablet by mouth daily., Disp: , Rfl:  .  cholecalciferol (VITAMIN D) 400 UNITS TABS tablet, Take 400 Units by mouth daily. , Disp: , Rfl:  .  cyanocobalamin 500 MCG tablet, Take 500 mcg by mouth daily., Disp: , Rfl:  .  erythromycin ophthalmic ointment, Place 1 application into both eyes 3 (three) times daily., Disp: 3.5 g, Rfl: 0 .  Insulin Lispro (HUMALOG [place]), Inject 10 Units into the skin 3 (three) times daily with meals. , Disp: , Rfl:  .  JARDIANCE 25 MG TABS tablet, Take 25 mg by mouth daily., Disp: , Rfl:  .  levothyroxine (SYNTHROID, LEVOTHROID) 100 MCG tablet, Take 100 mcg by mouth daily before breakfast. ,  Disp: , Rfl:  .  Multiple Vitamin (MULTIVITAMIN) tablet, Take 1 tablet by mouth daily., Disp: , Rfl:  .  ondansetron (ZOFRAN ODT) 4 MG disintegrating tablet, Take 1 tablet (4 mg total) by mouth every 8 (eight) hours as needed for nausea or vomiting., Disp: 10 tablet, Rfl: 0 .  simvastatin (ZOCOR) 20 MG tablet, TAKE 1/2 TABLET BY MOUTH DAILY, Disp: 90 tablet, Rfl: 1 .  Zinc 100 MG TABS, Take 1 tablet by mouth daily., Disp: , Rfl:    Allergies  Allergen Reactions  . Metformin And Related Other (See Comments)    Chest pain  . Penicillins Hives     Review of Systems  Constitutional: Negative.   HENT: Positive for congestion (nasal congestion initially).   Eyes: Positive for discharge,  redness and itching.  Respiratory: Negative.   Cardiovascular: Negative.   Neurological: Negative.   Psychiatric/Behavioral: Negative.      Today's Vitals   02/19/21 0925  BP: 122/80  Pulse: 95  Temp: 98 F (36.7 C)  TempSrc: Oral  Weight: 192 lb (87.1 kg)  Height: 5\' 11"  (1.803 m)  PainSc: 0-No pain   Body mass index is 26.78 kg/m.   Objective:  Physical Exam Vitals reviewed.  Constitutional:      General: She is not in acute distress.    Appearance: Normal appearance.  Eyes:     Comments: Conjunctivae is erythematous with small amount of matting noted to inner canthus of bilateral eyes. Slight puffiness to upper lids  Cardiovascular:     Rate and Rhythm: Normal rate and regular rhythm.     Pulses: Normal pulses.     Heart sounds: Normal heart sounds. No murmur heard.   Pulmonary:     Effort: Pulmonary effort is normal. No respiratory distress.     Breath sounds: Normal breath sounds. No wheezing.  Neurological:     General: No focal deficit present.     Mental Status: She is alert.         Assessment And Plan:     1. Acute conjunctivitis of both eyes, unspecified acute conjunctivitis type  Advised to avoid scratching eyes and to keep hands clean by washing after touching eyes - erythromycin ophthalmic ointment; Place 1 application into both eyes 3 (three) times daily.  Dispense: 3.5 g; Refill: 0  2. Type 2 diabetes mellitus without complication, without long-term current use of insulin (HCC) Diabetic foot exam done, with decreased sensation to right more than left  Patient was given opportunity to ask questions. Patient verbalized understanding of the plan and was able to repeat key elements of the plan. All questions were answered to their satisfaction.  [name], FNP   I, [name], FNP, have reviewed all documentation for this visit. The documentation on 02/19/21 for the exam, diagnosis, procedures, and orders are all accurate and complete.   IF  YOU HAVE BEEN REFERRED TO A SPECIALIST, IT MAY TAKE 1-2 WEEKS TO SCHEDULE/PROCESS THE REFERRAL. IF YOU HAVE NOT HEARD FROM US/SPECIALIST IN TWO WEEKS, PLEASE GIVE [place] A CALL AT [phone].   THE PATIENT IS ENCOURAGED TO PRACTICE SOCIAL DISTANCING DUE TO THE COVID-19 PANDEMIC.

## 2021-02-19 NOTE — Patient Instructions (Signed)
Viral Conjunctivitis, Adult  Viral conjunctivitis is an inflammation of the clear membrane that covers the white part of the eye and the inner surface of the eyelid (conjunctiva). The inflammation is caused by a viral infection. The blood vessels in the conjunctiva become enlarged, causing the eye to become red or pink and often itchy. It usually starts in one eye and goes to the other in a day or two. Infections often resolve over 1-2 weeks. Viral conjunctivitis is contagious. This means it can be easily passed from one person to another. This condition is often called pink eye. What are the causes? This condition is caused by a virus. It can be spread by touching objects that have been contaminated with the virus, such as doorknobs or towels, and then touching your eye. It can also be passed through tiny droplets, such as from coughing or sneezing. What increases the risk? You are more likely to develop this condition if you have a cold or the flu, or are in close contact with a person with pink eye. What are the signs or symptoms? Symptoms of this condition include:  Redness in the eye.  Tearing or watery eyes.  Itchy and irritated eyes.  Burning feeling in the eyes.  Clear drainage from the eye.  Swollen eyelids.  A gritty feeling in the eye.  Light sensitivity. This condition often occurs with other symptoms, such as nasal congestion, cough, and fever. How is this diagnosed? This condition is diagnosed with a medical history and physical exam. If you have discharge from your eye, the discharge may be tested to rule out other causes of conjunctivitis. How is this treated? Viral conjunctivitis does not respond to medicines that kill bacteria (antibiotics). The condition most often resolves on its own in 1-2 weeks. If treatment is needed, it is aimed at relieving your symptoms and preventing the spread of infection. This may be done with artificial tear drops, antihistamine drops, or  other eye medicines. In rare cases, steroid eye drops or anti-herpes virus medicines may be prescribed. Follow these instructions at home: Medicines  Take or apply over-the-counter and prescription medicines only as told by your health care provider.  Do not touch the edge of the eyelid with the eye-drop bottle or ointment tube when applying medicines to the affected eye. This will prevent the spread of the infection to the other eye or to other people.   Eye care  Avoid touching or rubbing your eyes.  Apply a clean, cool, wet washcloth onto your eye for 10-20 minutes, 3-4 times per day, or as told by your health care provider.  If you wear contact lenses, do not wear them until the inflammation is gone and your health care provider says it is safe to wear them again. Ask your health care provider how to disinfect or replace your contact lenses before using them again. Wear glasses until you can resume wearing contacts.  Avoid wearing eye makeup until the inflammation is gone. Throw away any old eye cosmetics that may be contaminated.  Gently wipe away any crusting from your eye with a wet washcloth or a cotton ball. General instructions  Change or wash your pillowcase every day or as told by your health care provider.  Do not share towels, pillowcases, washcloths, eye makeup, makeup brushes, contact lenses, or eyeglasses. This may spread the infection.  Wash your hands often with soap and water. Use paper towels to dry your hands. If soap and water are not available, use   hand sanitizer.  Avoid contact with other people until your eye is no longer red and tearing, or as told by your health care provider. Contact a health care provider if:  Your symptoms do not improve with treatment, or they get worse.  You have increased pain.  Your vision becomes blurry.  You have a fever.  You have facial pain, redness, or swelling.  You have yellow or green drainage coming from your  eye.  You have new symptoms. Get help right away if:  You develop severe pain.  Your vision gets much worse. Summary  Viral conjunctivitis is an inflammation of the clear membrane that covers the white part of the eye and the inner surface of the eyelid. It usually goes away in 1-2 weeks.  This condition is usually treated with medicines and cold compresses. Treatment focuses on relieving the symptoms.  This condition is very contagious. To prevent infection, avoid close contact with others, wash your hands often, and do not share towels or washcloths.  Contact a health care provider if your symptoms do not go away with treatment, or if you have more pain, poor vision, or swelling in the eyes.  Get help right away if you have severe pain or your vision gets much worse. This information is not intended to replace advice given to you by your health care provider. Make sure you discuss any questions you have with your health care provider. Document Revised: 09/06/2019 Document Reviewed: 08/20/2019 Elsevier Patient Education  2021 Elsevier Inc.  

## 2021-04-05 ENCOUNTER — Ambulatory Visit: Payer: No Typology Code available for payment source | Admitting: Nurse Practitioner

## 2021-04-24 ENCOUNTER — Encounter: Payer: Self-pay | Admitting: Nurse Practitioner

## 2021-04-24 LAB — HEMOGLOBIN A1C: Hemoglobin A1C: 8

## 2021-04-26 ENCOUNTER — Other Ambulatory Visit: Payer: Self-pay | Admitting: Nurse Practitioner

## 2021-04-26 DIAGNOSIS — I82403 Acute embolism and thrombosis of unspecified deep veins of lower extremity, bilateral: Secondary | ICD-10-CM

## 2021-05-01 ENCOUNTER — Other Ambulatory Visit: Payer: Self-pay

## 2021-05-01 ENCOUNTER — Encounter: Payer: Self-pay | Admitting: Nurse Practitioner

## 2021-05-01 ENCOUNTER — Ambulatory Visit (INDEPENDENT_AMBULATORY_CARE_PROVIDER_SITE_OTHER): Payer: No Typology Code available for payment source | Admitting: Nurse Practitioner

## 2021-05-01 VITALS — BP 122/80 | HR 70 | Temp 98.2°F | Ht 70.6 in | Wt 195.2 lb

## 2021-05-01 DIAGNOSIS — Z6827 Body mass index (BMI) 27.0-27.9, adult: Secondary | ICD-10-CM

## 2021-05-01 DIAGNOSIS — E119 Type 2 diabetes mellitus without complications: Secondary | ICD-10-CM

## 2021-05-01 DIAGNOSIS — Z23 Encounter for immunization: Secondary | ICD-10-CM

## 2021-05-01 DIAGNOSIS — E113293 Type 2 diabetes mellitus with mild nonproliferative diabetic retinopathy without macular edema, bilateral: Secondary | ICD-10-CM | POA: Diagnosis not present

## 2021-05-01 DIAGNOSIS — E782 Mixed hyperlipidemia: Secondary | ICD-10-CM

## 2021-05-01 LAB — CMP14+EGFR
ALT: 20 IU/L (ref 0–32)
AST: 16 IU/L (ref 0–40)
Albumin/Globulin Ratio: 2 (ref 1.2–2.2)
Albumin: 4.7 g/dL (ref 3.8–4.9)
Alkaline Phosphatase: 102 IU/L (ref 44–121)
BUN/Creatinine Ratio: 12 (ref 9–23)
BUN: 11 mg/dL (ref 6–24)
Bilirubin Total: 0.5 mg/dL (ref 0.0–1.2)
CO2: 25 mmol/L (ref 20–29)
Calcium: 10.3 mg/dL — ABNORMAL HIGH (ref 8.7–10.2)
Chloride: 101 mmol/L (ref 96–106)
Creatinine, Ser: 0.94 mg/dL (ref 0.57–1.00)
Globulin, Total: 2.4 g/dL (ref 1.5–4.5)
Glucose: 161 mg/dL — ABNORMAL HIGH (ref 65–99)
Potassium: 4.9 mmol/L (ref 3.5–5.2)
Sodium: 140 mmol/L (ref 134–144)
Total Protein: 7.1 g/dL (ref 6.0–8.5)
eGFR: 72 mL/min/{1.73_m2} (ref >59)

## 2021-05-01 LAB — LIPID PANEL
Chol/HDL Ratio: 3.5 ratio (ref 0.0–4.4)
Cholesterol, Total: 215 mg/dL — ABNORMAL HIGH (ref 100–199)
HDL: 61 mg/dL (ref >39)
LDL Chol Calc (NIH): 135 mg/dL — ABNORMAL HIGH (ref 0–99)
Triglycerides: 108 mg/dL (ref 0–149)
VLDL Cholesterol Cal: 19 mg/dL (ref 5–40)

## 2021-05-01 NOTE — Patient Instructions (Addendum)
Diabetes Mellitus Basics  Diabetes mellitus, or diabetes, is a long-term (chronic) disease. It occurs when the body does not properly use sugar (glucose) that is released from food after you eat. Diabetes mellitus may be caused by one or both of these problems: Your pancreas does not make enough of a hormone called insulin. Your body does not react in a normal way to the insulin that it makes. Insulin lets glucose enter cells in your body. This gives you energy. If you have diabetes, glucose cannot get into cells. This causes high blood glucose (hyperglycemia). How to treat and manage diabetes You may need to take insulin or other diabetes medicines daily to keep your glucose in balance. If you are prescribed insulin, you will learn how to give yourself insulin by injection. You may need to adjust the amount of insulin youtake based on the foods that you eat. You will need to check your blood glucose levels using a glucose monitor as told by your health care provider. The readings can help determine if you havelow or high blood glucose. Generally, you should have these blood glucose levels: Before meals (preprandial): 80-130 mg/dL (4.4-7.2 mmol/L). After meals (postprandial): below 180 mg/dL (10 mmol/L). Hemoglobin A1c (HbA1c) level: less than 7%. Your health care provider will set treatment goals for you. Keep all follow-up visits. This is important. Follow these instructions at home: Diabetes medicines Take your diabetes medicines every day as told by your health care provider. List your diabetes medicines here: Name of medicine: ______________________________ Amount (dose): _______________ Time (a.m./p.m.): _______________ Notes: ___________________________________ Name of medicine: ______________________________ Amount (dose): _______________ Time (a.m./p.m.): _______________ Notes: ___________________________________ Name of medicine: ______________________________ Amount (dose):  _______________ Time (a.m./p.m.): _______________ Notes: ___________________________________ Insulin If you use insulin, list the types of insulin you use here: Insulin type: ______________________________ Amount (dose): _______________ Time (a.m./p.m.): _______________Notes: ___________________________________ Insulin type: ______________________________ Amount (dose): _______________ Time (a.m./p.m.): _______________ Notes: ___________________________________ Insulin type: ______________________________ Amount (dose): _______________ Time (a.m./p.m.): _______________ Notes: ___________________________________ Insulin type: ______________________________ Amount (dose): _______________ Time (a.m./p.m.): _______________ Notes: ___________________________________ Insulin type: ______________________________ Amount (dose): _______________ Time (a.m./p.m.): _______________ Notes: ___________________________________ Managing blood glucose  Check your blood glucose levels using a glucose monitor as told by your healthcare provider. Write down the times that you check your glucose levels here: Time: _______________ Notes: ___________________________________ Time: _______________ Notes: ___________________________________ Time: _______________ Notes: ___________________________________ Time: _______________ Notes: ___________________________________ Time: _______________ Notes: ___________________________________ Time: _______________ Notes: ___________________________________  Low blood glucose Low blood glucose (hypoglycemia) is when glucose is at or below 70 mg/dL (3.9 mmol/L). Symptoms may include: Feeling: Hungry. Sweaty and clammy. Irritable or easily upset. Dizzy. Sleepy. Having: A fast heartbeat. A headache. A change in your vision. Numbness around the mouth, lips, or tongue. Having trouble with: Moving (coordination). Sleeping. Treating low blood glucose To treat low blood  glucose, eat or drink something containing sugar right away. If you can think clearly and swallow safely, follow the 15:15 rule: Take 15 grams of a fast-acting carb (carbohydrate), as told by your health care provider. Some fast-acting carbs are: Glucose tablets: take 3-4 tablets. Hard candy: eat 3-5 pieces. Fruit juice: drink 4 oz (120 mL). Regular (not diet) soda: drink 4-6 oz (120-180 mL). Honey or sugar: eat 1 Tbsp (15 mL). Check your blood glucose levels 15 minutes after you take the carb. If your glucose is still at or below 70 mg/dL (3.9 mmol/L), take 15 grams of a carb again. If your glucose does not go above 70 mg/dL (3.9 mmol/L) after 3 tries, get  help right away. After your glucose goes back to normal, eat a meal or a snack within 1 hour. Treating very low blood glucose If your glucose is at or below 54 mg/dL (3 mmol/L), you have very low blood glucose (severe hypoglycemia). This is an emergency. Do not wait to see if the symptoms will go away. Get medical help right away. Call your local emergency services (911 in the [place] Do not drive yourself to the hospital. Questions to ask your health care provider Should I talk with a diabetes educator? What equipment will I need to care for myself at home? What diabetes medicines do I need? When should I take them? How often do I need to check my blood glucose levels? What number can I call if I have questions? When is my follow-up visit? Where can I find a support group for people with diabetes? Where to find more information American Diabetes Association: www.diabetes.org Association of Diabetes Care and Education Specialists: www.diabeteseducator.org Contact a health care provider if: Your blood glucose is at or above 240 mg/dL (13.3 mmol/L) for 2 days in a row. You have been sick or have had a fever for 2 days or more, and you are not getting better. You have any of these problems for more than 6 hours: You cannot eat or  drink. You feel nauseous. You vomit. You have diarrhea. Get help right away if: Your blood glucose is lower than 54 mg/dL (3 mmol/L). You get confused. You have trouble thinking clearly. You have trouble breathing. These symptoms may represent a serious problem that is an emergency. Do not wait to see if the symptoms will go away. Get medical help right away. Call your local emergency services (911 in the [place] Do not drive yourself to the hospital. Summary Diabetes mellitus is a chronic disease that occurs when the body does not properly use sugar (glucose) that is released from food after you eat. Take insulin and diabetes medicines as told. Check your blood glucose every day, as often as told. Keep all follow-up visits. This is important. This information is not intended to replace advice given to you by your health care provider. Make sure you discuss any questions you have with your healthcare provider. Document Revised: 02/08/2020 Document Reviewed: 02/08/2020 Elsevier Patient Education  [place].   Tdap (Tetanus, Diphtheria, Pertussis) Vaccine: What You Need to Know 1. Why get vaccinated? Tdap vaccine can prevent tetanus, diphtheria, and pertussis. Diphtheria and pertussis spread from person to person. Tetanus enters the body through cuts or wounds. TETANUS (T) causes painful stiffening of the muscles. Tetanus can lead to serious health problems, including being unable to open the mouth, having trouble swallowing and breathing, or death. DIPHTHERIA (D) can lead to difficulty breathing, heart failure, paralysis, or death. PERTUSSIS (aP), also known as "whooping cough," can cause uncontrollable, violent coughing that makes it hard to breathe, eat, or drink. Pertussis can be extremely serious especially in babies and young children, causing pneumonia, convulsions, brain damage, or death. In teens and adults, it can cause weight loss, loss of bladder control, passing out, and  rib fractures from severe coughing. 2. Tdap vaccine Tdap is only for children 7 years and older, adolescents, and adults.  Adolescents should receive a single dose of Tdap, preferably at age 49 or 27 years. Pregnant people should get a dose of Tdap during every pregnancy, preferably during the early part of the third trimester, to help protect the newborn from pertussis. Infants are most  at risk for severe, life-threatening complications frompertussis. Adults who have never received Tdap should get a dose of Tdap. Also, adults should receive a booster dose of either Tdap or Td (a different vaccine that protects against tetanus and diphtheria but not pertussis) every 10 years, or after 5 years in the case of a severe or dirty wound or burn. Tdap may be given at the same time as other vaccines. 3. Talk with your health care provider Tell your vaccine provider if the person getting the vaccine: Has had an allergic reaction after a previous dose of any vaccine that protects against tetanus, diphtheria, or pertussis, or has any severe, life-threatening allergies Has had a coma, decreased level of consciousness, or prolonged seizures within 7 days after a previous dose of any pertussis vaccine (DTP, DTaP, or Tdap) Has seizures or another nervous system problem Has ever had Guillain-Barr Syndrome (also called "GBS") Has had severe pain or swelling after a previous dose of any vaccine that protects against tetanus or diphtheria In some cases, your health care provider may decide to postpone Tdapvaccination until a future visit. People with minor illnesses, such as a cold, may be vaccinated. People who are moderately or severely ill should usually wait until they recover beforegetting Tdap vaccine.  Your health care provider can give you more information. 4. Risks of a vaccine reaction Pain, redness, or swelling where the shot was given, mild fever, headache, feeling tired, and nausea, vomiting, diarrhea,  or stomachache sometimes happen after Tdap vaccination. People sometimes faint after medical procedures, including vaccination. Tellyour provider if you feel dizzy or have vision changes or ringing in the ears.  As with any medicine, there is a very remote chance of a vaccine causing asevere allergic reaction, other serious injury, or death. 5. What if there is a serious problem? An allergic reaction could occur after the vaccinated person leaves the clinic. If you see signs of a severe allergic reaction (hives, swelling of the face and throat, difficulty breathing, a fast heartbeat, dizziness, or weakness), call [id number] get the person to the nearest hospital. For other signs that concern you, call your health care provider.  Adverse reactions should be reported to the Vaccine Adverse Event Reporting System (VAERS). Your health care provider will usually file this report, or you can do it yourself. Visit the VAERS website at www.vaers.SamedayNews.es or call 610-695-4575. VAERS is only for reporting reactions, and VAERS staff members do not give medical advice. 6. The National Vaccine Injury Compensation Program The [employer] Vaccine Injury Compensation Program (VICP) is a federal program that was created to compensate people who may have been injured by certain vaccines. Claims regarding alleged injury or death due to vaccination have a time limit for filing, which may be as short as two years. Visit the VICP website at GoldCloset.com.ee or call 601-663-3397to learn about the program and about filing a claim. 7. How can I learn more? Ask your health care provider. Call your local or state health department. Visit the website of the Food and Drug Administration (FDA) for vaccine package inserts and additional information at TraderRating.uy. Contact the Centers for Disease Control and Prevention (CDC): Call 979-784-4887 (1-800-CDC-INFO) or Visit CDC's website  at http://hunter.com/. Vaccine Information Statement Tdap (Tetanus, Diphtheria, Pertussis) Vaccine(05/26/2020) This information is not intended to replace advice given to you by your health care provider. Make sure you discuss any questions you have with your healthcare provider. Document Revised: 06/21/2020 Document Reviewed: 06/21/2020 Elsevier Patient Education  2022 [employer].

## 2021-05-01 NOTE — Progress Notes (Signed)
I,Yamilka Roman [employer] as a Education administrator for [employer], FNP.,have documented all relevant documentation on the behalf of [name], FNP,as directed by  [name], FNP while in the presence of [name], [place].  This visit occurred during the SARS-CoV-2 public health emergency.  Safety protocols were in place, including screening questions prior to the visit, additional usage of staff PPE, and extensive cleaning of exam room while observing appropriate contact time as indicated for disinfecting solutions.  Subjective:     Patient ID: [name] , female    DOB: 06-Feb-1966 , 55 y.o.   MRN: [id number]   Chief Complaint  Patient presents with   Diabetes    HPI  Patient presents today for a f/u on he dm.  She has been taking magnesium and iron after having dizziness and fatigue. She continues to see Dr Chalmers Cater - HgbA1c went up to 8 and her insulin was increased to 14 units, lunch 5 units and evening 10 units. She was walking with her husband.    Wt Readings from Last 3 Encounters: 05/01/21 : 195 lb 3.2 oz (88.5 kg) 02/19/21 : 192 lb (87.1 kg) 12/27/20 : 192 lb (87.1 kg)     Diabetes She presents for her follow-up diabetic visit. She has type 2 diabetes mellitus. Her disease course has been stable. There are no hypoglycemic associated symptoms. Pertinent negatives for diabetes include no polydipsia, no polyphagia and no polyuria. There are no hypoglycemic complications. There are no diabetic complications. Risk factors for coronary artery disease include obesity and sedentary lifestyle. Eye exam is not current.    Past Medical History:  Diagnosis Date   ASCUS 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  colposcopy adequate normal 10/2015    Hyperlipidemia    Mild non proliferative diabetic retinopathy ([place]) 01/01/2021   Left eye    Pulmonary embolism ([place])    STD (sexually transmitted disease) 1991   POS GC-TREATED   Thyroid disease      Family History  Problem Relation Age of Onset   Heart disease Father    Lung cancer Mother    Brain cancer Mother    Diabetes Mother    Hypertension Mother    Diabetes Brother    Kidney disease Brother    Cervical cancer Paternal Grandmother    Breast cancer Neg Hx      Current Outpatient Medications:    Ascorbic Acid (VITAMIN C ADULT GUMMIES PO), Take 1 tablet by mouth daily., Disp: , Rfl:    cholecalciferol (VITAMIN D) 400 UNITS TABS tablet, Take 400 Units by mouth daily. , Disp: , Rfl:    cyanocobalamin 500 MCG tablet, Take 500 mcg by mouth daily., Disp: , Rfl:    ELIQUIS 5 MG TABS tablet, TAKE 1 TABLET(5 MG) BY MOUTH TWICE DAILY, Disp: 180 tablet, Rfl: 3   Insulin Lispro (HUMALOG [place]), Inject 10 Units into the skin 3 (three) times daily with meals. , Disp: , Rfl:    JARDIANCE 25 MG TABS tablet, Take 25 mg by mouth daily., Disp: , Rfl:    levothyroxine (SYNTHROID, LEVOTHROID) 100 MCG tablet,  Take 100 mcg by mouth daily before breakfast. , Disp: , Rfl:    Multiple Vitamin (MULTIVITAMIN) tablet, Take 1 tablet by mouth daily., Disp: , Rfl:    simvastatin (ZOCOR) 20 MG tablet, TAKE 1/2 TABLET BY MOUTH DAILY, Disp: 90 tablet, Rfl: 1   Zinc 100 MG TABS, Take 1 tablet by mouth daily., Disp: , Rfl:    Allergies  Allergen Reactions   Metformin And Related Other (See Comments)    Chest pain   Penicillins Hives     Review of Systems  Constitutional: Negative.   Eyes: Negative.   Endocrine: Negative for polydipsia, polyphagia and polyuria.  Musculoskeletal: Negative.   Skin: Negative.   Psychiatric/Behavioral: Negative.      Today's Vitals   05/01/21  0915  BP: 122/80  Pulse: 70  Temp: 98.2 F (36.8 C)  Weight: 195 lb 3.2 oz (88.5 kg)  Height: 5' 10.6" (1.793 m)  PainSc: 3   PainLoc: Leg   Body mass index is 27.53 kg/m.   Objective:  Physical Exam Constitutional:      General: She is not in acute distress.    Appearance: Normal appearance. She is well-developed.  Cardiovascular:     Rate and Rhythm: Normal rate and regular rhythm.     Heart sounds: Normal heart sounds. No murmur heard. Pulmonary:     Effort: Pulmonary effort is normal. No respiratory distress.     Breath sounds: Normal breath sounds.  Chest:     Chest wall: No tenderness.  Musculoskeletal:        General: Normal range of motion.     Cervical back: Normal range of motion and neck supple.  Skin:    General: Skin is warm and dry.     Capillary Refill: Capillary refill takes less than 2 seconds.     Comments: Slight keloid to right upper neck and left upper chest area.   Neurological:     General: No focal deficit present.     Mental Status: She is alert and oriented to person, place, and time.     Cranial Nerves: No cranial nerve deficit.  Psychiatric:        Mood and Affect: Mood normal.        Behavior: Behavior normal.        Thought Content: Thought content normal.        Judgment: Judgment normal.        Assessment And Plan:     1. Type 2 diabetes mellitus without complication, without long-term current use of insulin (HCC) Comments: Stable, continue follow up with endocrinology - Tdap vaccine greater than or equal to 7yo IM - Lipid panel - CMP14+EGFR  2. Mild nonproliferative diabetic retinopathy of both eyes without macular edema associated with type 2 diabetes mellitus ([place]) Comments: Being managed by opthalmology  3. Mixed hyperlipidemia Comments: Stable, continue with statin - Lipid panel  4. BMI 27.0-27.9,adult   Reviewed note from [facility]   Patient was given opportunity to ask questions. Patient verbalized  understanding of the plan and was able to repeat key elements of the plan. All questions were answered to their satisfaction.  [name], FNP   I, [name], FNP, have reviewed all documentation for this visit. The documentation on 05/18/21 for the exam, diagnosis, procedures, and orders are all accurate and complete.   IF YOU HAVE BEEN REFERRED TO A SPECIALIST, IT MAY TAKE 1-2 WEEKS TO SCHEDULE/PROCESS THE REFERRAL. IF YOU HAVE NOT HEARD FROM US/SPECIALIST IN  TWO WEEKS, PLEASE GIVE [place] A CALL AT [phone].   THE PATIENT IS ENCOURAGED TO PRACTICE SOCIAL DISTANCING DUE TO THE COVID-19 PANDEMIC.

## 2021-05-17 ENCOUNTER — Other Ambulatory Visit (HOSPITAL_COMMUNITY): Payer: Self-pay | Admitting: Vascular Surgery

## 2021-05-17 DIAGNOSIS — I824Y9 Acute embolism and thrombosis of unspecified deep veins of unspecified proximal lower extremity: Secondary | ICD-10-CM

## 2021-05-18 ENCOUNTER — Ambulatory Visit (HOSPITAL_COMMUNITY): Payer: No Typology Code available for payment source

## 2021-05-28 ENCOUNTER — Ambulatory Visit (HOSPITAL_COMMUNITY)
Admission: RE | Admit: 2021-05-28 | Discharge: 2021-05-28 | Disposition: A | Discharge status: Home / Self Care | Payer: No Typology Code available for payment source | Source: Ambulatory Visit | Attending: Vascular Surgery | Admitting: Vascular Surgery

## 2021-05-28 ENCOUNTER — Other Ambulatory Visit: Payer: Self-pay

## 2021-05-28 DIAGNOSIS — I824Y9 Acute embolism and thrombosis of unspecified deep veins of unspecified proximal lower extremity: Secondary | ICD-10-CM | POA: Insufficient documentation

## 2021-09-03 ENCOUNTER — Ambulatory Visit: Payer: No Typology Code available for payment source | Admitting: Nurse Practitioner

## 2021-09-11 ENCOUNTER — Encounter: Payer: Self-pay | Admitting: Nurse Practitioner

## 2021-09-11 ENCOUNTER — Ambulatory Visit: Payer: No Typology Code available for payment source | Admitting: Nurse Practitioner

## 2021-09-11 ENCOUNTER — Other Ambulatory Visit: Payer: Self-pay

## 2021-09-11 VITALS — BP 114/80 | HR 73 | Temp 98.4°F | Ht 70.6 in | Wt 196.4 lb

## 2021-09-11 DIAGNOSIS — E119 Type 2 diabetes mellitus without complications: Secondary | ICD-10-CM | POA: Diagnosis not present

## 2021-09-11 DIAGNOSIS — E039 Hypothyroidism, unspecified: Secondary | ICD-10-CM

## 2021-09-11 DIAGNOSIS — R002 Palpitations: Secondary | ICD-10-CM

## 2021-09-11 DIAGNOSIS — E782 Mixed hyperlipidemia: Secondary | ICD-10-CM

## 2021-09-11 DIAGNOSIS — Z2821 Immunization not carried out because of patient refusal: Secondary | ICD-10-CM

## 2021-09-11 NOTE — Patient Instructions (Signed)
Cooking With Less Salt Cooking with less salt is one way to reduce the amount of sodium you get from food. Sodium is one of the elements that make up salt. It is found naturally in foods and is also added to certain foods. Depending on your condition and overall health, your health care provider or dietitian may recommend that you reduce your sodium intake. Most people should have less than 2,300 milligrams (mg) of sodium each day. If you have high blood pressure (hypertension), you may need to limit your sodium to 1,500 mg each day. Follow the tipsbelow to help reduce your sodium intake. What are tips for eating less sodium? Reading food labels  Check the food label before buying or using packaged ingredients. Always check the label for the serving size and sodium content. Look for products with no more than 140 mg of sodium in one serving. Check the % Daily Value column to see what percent of the daily recommended amount of sodium is provided in one serving of the product. Foods with 5% or less in this column are considered low in sodium. Foods with 20% or higher are considered high in sodium. Do not choose foods with salt as one of the first three ingredients on the ingredients list. If salt is one of the first three ingredients, it usually means the item is high in sodium.  Shopping Buy sodium-free or low-sodium products. Look for the following words on food labels: Low-sodium. Sodium-free. Reduced-sodium. No salt added. Unsalted. Always check the sodium content even if foods are labeled as low-sodium or no salt added. Buy fresh foods. Cooking Use herbs, seasonings without salt, and spices as substitutes for salt. Use sodium-free baking soda when baking. Grill, braise, or roast foods to add flavor with less salt. Avoid adding salt to pasta, rice, or hot cereals. Drain and rinse canned vegetables, beans, and meat before use. Avoid adding salt when cooking sweets and desserts. Cook with  low-sodium ingredients. What foods are high in sodium? Vegetables Regular canned vegetables (not low-sodium or reduced-sodium). Sauerkraut, pickled vegetables, and relishes. Olives. French fries. Onion rings. Regular canned tomato sauce and paste. Regular tomato and vegetable juice. Frozenvegetables in sauces. Grains Instant hot cereals. Bread stuffing, pancake, and biscuit mixes. Croutons. Seasoned rice or pasta mixes. Noodle soup cups. Boxed or frozen macaroni and cheese. Regular salted crackers. Self-rising flour. Rolls. Bagels. Flourtortillas and wraps. Meats and other proteins Meat or fish that is salted, canned, smoked, cured, spiced, or pickled. This includes bacon, ham, sausages, hot dogs, corned beef, chipped beef, meat loaves, salt pork, jerky, pickled herring, anchovies, regular canned tuna, andsardines. Salted nuts. Dairy Processed cheese and cheese spreads. Cheese curds. Blue cheese. Feta cheese.String cheese. Regular cottage cheese. Buttermilk. Canned milk. The items listed above may not be a complete list of foods high in sodium. Actual amounts of sodium may be different depending on processing. Contact a dietitian for more information. What foods are low in sodium? Fruits Fresh, frozen, or canned fruit with no sauce added. Fruit juice. Vegetables Fresh or frozen vegetables with no sauce added. "No salt added" canned vegetables. "No salt added" tomato sauce and paste. Low-sodium orreduced-sodium tomato and vegetable juice. Grains Noodles, pasta, quinoa, rice. Shredded or puffed wheat or puffed rice. Regular or quick oats (not instant). Low-sodium crackers. Low-sodium bread. Whole-grainbread and whole-grain pasta. Unsalted popcorn. Meats and other proteins Fresh or frozen whole meats, poultry (not injected with sodium), and fish with no sauce added. Unsalted nuts. Dried peas, beans, and   lentils without added salt. Unsalted canned beans. Eggs. Unsalted nut butters. Low-sodium canned  tunaor chicken. Dairy Milk. Soy milk. Yogurt. Low-sodium cheeses, such as Swiss, Monterey Jack, mozzarella, and ricotta. Sherbet or ice cream (keep to  cup per serving).Cream cheese. Fats and oils Unsalted butter or margarine. Other foods Homemade pudding. Sodium-free baking soda and baking powder. Herbs and spices.Low-sodium seasoning mixes. Beverages Coffee and tea. Carbonated beverages. The items listed above may not be a complete list of foods low in sodium. Actual amounts of sodium may be different depending on processing. Contact a dietitian for more information. What are some salt alternatives when cooking? The following are herbs, seasonings, and spices that can be used instead of salt to flavor your food. Herbs should be fresh or dried. Do not choose packaged mixes. Next to the name of the herb, spice, or seasoning aresome examples of foods you can pair it with. Herbs Bay leaves - Soups, meat and vegetable dishes, and spaghetti sauce. Basil - Italian dishes, soups, pasta, and fish dishes. Cilantro - Meat, poultry, and vegetable dishes. Chili powder - Marinades and Mexican dishes. Chives - Salad dressings and potato dishes. Cumin - Mexican dishes, couscous, and meat dishes. Dill - Fish dishes, sauces, and salads. Fennel - Meat and vegetable dishes, breads, and cookies. Garlic (do not use garlic salt) - Italian dishes, meat dishes, salad dressings, and sauces. Marjoram - Soups, potato dishes, and meat dishes. Oregano - Pizza and spaghetti sauce. Parsley - Salads, soups, pasta, and meat dishes. Rosemary - Italian dishes, salad dressings, soups, and red meats. Saffron - Fish dishes, pasta, and some poultry dishes. Sage - Stuffings and sauces. Tarragon - Fish and poultry dishes. Thyme - Stuffing, meat, and fish dishes. Seasonings Lemon juice - Fish dishes, poultry dishes, vegetables, and salads. Vinegar - Salad dressings, vegetables, and fish dishes. Spices Cinnamon - Sweet  dishes, such as cakes, cookies, and puddings. Cloves - Gingerbread, puddings, and marinades for meats. Curry - Vegetable dishes, fish and poultry dishes, and stir-fry dishes. Ginger - Vegetable dishes, fish dishes, and stir-fry dishes. Nutmeg - Pasta, vegetables, poultry, fish dishes, and custard. Summary Cooking with less salt is one way to reduce the amount of sodium that you get from food. Buy sodium-free or low-sodium products. Check the food label before using or buying packaged ingredients. Use herbs, seasonings without salt, and spices as substitutes for salt in foods. This information is not intended to replace advice given to you by your health care provider. Make sure you discuss any questions you have with your healthcare provider. Document Revised: 09/29/2019 Document Reviewed: 09/29/2019 Elsevier Patient Education  2022 Elsevier Inc.  

## 2021-09-11 NOTE — Progress Notes (Signed)
I,Katawbba Wiggins,acting as a Education administrator for [employer], FNP.,have documented all relevant documentation on the behalf of [name], FNP,as directed by  [name], FNP while in the presence of [name], [place].   This visit occurred during the SARS-CoV-2 public health emergency.  Safety protocols were in place, including screening questions prior to the visit, additional usage of staff PPE, and extensive cleaning of exam room while observing appropriate contact time as indicated for disinfecting solutions.  Subjective:     Patient ID: [name] , female    DOB: 1966-08-27 , 55 y.o.   MRN: [id number]   Chief Complaint  Patient presents with   Hypertension    HPI  The patient is here today for a diabetes f/u, continues to see Dr. Chalmers Cater and has an appt next week.   She made an appt with Dr. Johney Frame on December 15th. She has been drinking coffee. Reports drinking adequate amounts of water.   Hypertension This is a chronic problem. The current episode started more than 1 year ago. The problem is controlled. Associated symptoms include palpitations (She had more significant yesterday, causes her to cough). Pertinent negatives include no anxiety, chest pain or headaches. There is no history of angina. There is no history of chronic renal disease.  Diabetes She presents for her follow-up diabetic visit. She has type 2 diabetes mellitus. Her disease course has been stable. There are no hypoglycemic associated symptoms. Pertinent negatives for hypoglycemia include no dizziness or headaches. There are no diabetic associated symptoms. Pertinent negatives for diabetes include no chest pain, no polydipsia, no polyphagia and no polyuria. There are no hypoglycemic complications. There are no diabetic complications. Risk factors for coronary artery disease include obesity and sedentary lifestyle. Current diabetic treatment includes oral agent (dual therapy). (Average 140's ) Eye exam is not current.     Past Medical History:  Diagnosis Date   ASCUS 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  ECC with HPV effect   Cancer of colon with rectum ([place]) 2012   Diabetes mellitus without complication ([place])    VVKPQAES(975.3)    experiences migraine every couple of weeks. 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   Left eye    Pulmonary embolism ([place])    STD (sexually transmitted disease) 1991   POS GC-TREATED   Thyroid disease      Family History  Problem Relation Age of Onset   Heart disease Father    Lung cancer Mother    Brain cancer Mother    Diabetes Mother    Hypertension Mother    Diabetes Brother    Kidney disease Brother    Cervical cancer Paternal Grandmother    Breast cancer Neg Hx      Current Outpatient Medications:    Ascorbic Acid (VITAMIN C ADULT GUMMIES PO), Take 1 tablet by mouth daily., Disp: , Rfl:    cholecalciferol (VITAMIN D) 400 UNITS TABS tablet, Take 400 Units by mouth daily. , Disp: , Rfl:    cyanocobalamin 500 MCG tablet, Take 500 mcg by mouth daily., Disp: , Rfl:    ELIQUIS 5 MG TABS tablet, TAKE 1 TABLET(5 MG) BY MOUTH TWICE DAILY, Disp: 180 tablet, Rfl: 3   Insulin Lispro (HUMALOG [place]),  Inject 10 Units into the skin 3 (three) times daily with meals. , Disp: , Rfl:    JARDIANCE 25 MG TABS tablet, Take 25 mg by mouth daily., Disp: , Rfl:    levothyroxine (SYNTHROID, LEVOTHROID) 100 MCG tablet, Take 100 mcg by mouth daily before breakfast. , Disp: , Rfl:    simvastatin (ZOCOR) 20 MG tablet, TAKE 1/2 TABLET BY MOUTH DAILY, Disp: 90 tablet, Rfl: 1   Zinc 100 MG TABS, Take 1 tablet by mouth daily., Disp: , Rfl:    Multiple Vitamin (MULTIVITAMIN) tablet, Take 1 tablet by mouth daily. (Patient  not taking: Reported on 09/11/2021), Disp: , Rfl:    Allergies  Allergen Reactions   Metformin And Related Other (See Comments)    Chest pain   Penicillins Hives     Review of Systems  Constitutional: Negative.   Respiratory: Negative.  Negative for cough and wheezing.   Cardiovascular:  Positive for palpitations (She had more significant yesterday, causes her to cough). Negative for chest pain and leg swelling.  Gastrointestinal:  Negative for nausea.  Endocrine: Negative for polydipsia, polyphagia and polyuria.  Neurological:  Negative for dizziness, syncope and headaches.  Psychiatric/Behavioral: Negative.    All other systems reviewed and are negative.   Today's Vitals   09/11/21 1100  BP: 114/80  Pulse: 73  Temp: 98.4 F (36.9 C)  Weight: 196 lb 6.4 oz (89.1 kg)  Height: 5' 10.6" (1.793 m)   Body mass index is 27.7 kg/m.  Wt Readings from Last 3 Encounters:  09/11/21 196 lb 6.4 oz (89.1 kg)  05/01/21 195 lb 3.2 oz (88.5 kg)  02/19/21 192 lb (87.1 kg)    BP Readings from Last 3 Encounters:  09/11/21 114/80  05/01/21 122/80  02/19/21 122/80    Objective:  Physical Exam Vitals reviewed.  Constitutional:      General: She is not in acute distress.    Appearance: Normal appearance. She is well-developed.  Cardiovascular:     Rate and Rhythm: Normal rate. Rhythm irregular.     Pulses: Normal pulses.     Heart sounds: Normal heart sounds. No murmur heard. Pulmonary:     Effort: Pulmonary effort is normal. No respiratory distress.     Breath sounds: Normal breath sounds. No wheezing.  Chest:     Chest wall: No tenderness.  Musculoskeletal:     Cervical back: Normal range of motion and neck supple.  Skin:    General: Skin is warm and dry.     Capillary Refill: Capillary refill takes less than 2 seconds.     Comments: Slight keloid to right upper neck and left upper chest area.   Neurological:     General: No focal deficit present.     Mental Status: She is  alert and oriented to person, place, and time.     Cranial Nerves: No cranial nerve deficit.     Motor: No weakness.  Psychiatric:        Mood and Affect: Mood normal.        Behavior: Behavior normal.        Thought Content: Thought content normal.        Judgment: Judgment normal.        Assessment And Plan:     1. Type 2 diabetes mellitus without complication, without long-term current use of insulin (HCC) Comments: Her last HgbA1c with Dr. Chalmers Cater was 8, she is to continue follow up with Dr. Chalmers Cater - BMP8+EGFR  2. Palpitation Comments:  EKG done with HR 67 Advised to avoid caffeine and stay hydrated with water Has appt with Cardiology in Dec, if worse go to ER  - EKG 12-Lead  3. Mixed hyperlipidemia Comments: Stable, continue current medications.  Tolerating well  4. Hypothyroidism, unspecified type Comments: Continue follow up with Dr. Chalmers Cater  5. COVID-19 vaccination declined Declines covid 19 vaccine. Discussed risk of covid 62 and if she changes her mind about the vaccine to call the office.  Encouraged to take multivitamin, vitamin d, vitamin c and zinc to increase immune system. Aware can call office if would like to have vaccine here at office.   6. Influenza vaccination declined Patient declined influenza vaccination at this time. Patient is aware that influenza vaccine prevents illness in 70% of healthy people, and reduces hospitalizations to 30-70% in elderly. This vaccine is recommended annually. Pt is willing to accept risk associated with refusing vaccination.  7. Herpes zoster vaccination declined  8. Pneumococcal vaccination declined    Patient was given opportunity to ask questions. Patient verbalized understanding of the plan and was able to repeat key elements of the plan. All questions were answered to their satisfaction.  [name], FNP   I, [name], FNP, have reviewed all documentation for this visit. The documentation on 09/11/21 for the exam,  diagnosis, procedures, and orders are all accurate and complete.   IF YOU HAVE BEEN REFERRED TO A SPECIALIST, IT MAY TAKE 1-2 WEEKS TO SCHEDULE/PROCESS THE REFERRAL. IF YOU HAVE NOT HEARD FROM US/SPECIALIST IN TWO WEEKS, PLEASE GIVE [place] A CALL AT [phone] X [phone].   THE PATIENT IS ENCOURAGED TO PRACTICE SOCIAL DISTANCING DUE TO THE COVID-19 PANDEMIC.

## 2021-09-12 LAB — BMP8+EGFR
BUN/Creatinine Ratio: 25 — ABNORMAL HIGH (ref 9–23)
BUN: 20 mg/dL (ref 6–24)
CO2: 24 mmol/L (ref 20–29)
Calcium: 9.8 mg/dL (ref 8.7–10.2)
Chloride: 105 mmol/L (ref 96–106)
Creatinine, Ser: 0.8 mg/dL (ref 0.57–1.00)
Glucose: 163 mg/dL — ABNORMAL HIGH (ref 70–99)
Potassium: 4.3 mmol/L (ref 3.5–5.2)
Sodium: 144 mmol/L (ref 134–144)
eGFR: 88 mL/min/{1.73_m2} (ref >59)

## 2021-09-29 NOTE — Progress Notes (Deleted)
Cardiology Office Note:    Date:  09/29/2021   ID:  [name], DOB Feb 24, 1966, MRN [id number]  PCP:  [name], FNP   [facility] Providers Cardiologist:  None {   Referring MD: [name], FNP     History of Present Illness:    [name] is a 55 y.o. female with a hx of DMII, HLD, family history of HCM, prior PE in 2011/01/30, rectal cancer s/p resection who was previously followed by Dr. Meda Coffee with last visit in 01-30-15 who presents to re-establish care.  Patient initially saw Dr. Verl Blalock in 01/29/05 after her father died from SCD with autopsy revealing HCM. Both her and her brother had ECGs and TTEs at that time which were not suggestive of HCM. She had a normal echo and stress echo in 29-Jan-2014. She last saw Dr. Meda Coffee in 01/30/15.  Today, ***  Past Medical History:  Diagnosis Date   ASCUS 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  ECC with HPV effect   Cancer of colon with rectum ([place]) 01-30-2011   Diabetes mellitus without complication ([place])    VQQVZDGL(875.6)    experiences migraine every couple of weeks. 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  colposcopy adequate normal 10/2015    Hyperlipidemia    Mild non proliferative diabetic retinopathy ([place]) 01/01/2021   Left eye    Pulmonary embolism ([place])    STD (sexually transmitted disease) 1991   POS GC-TREATED   Thyroid disease     Past Surgical History:  Procedure Laterality Date   AORTA - BILATERAL FEMORAL ARTERY BYPASS GRAFT N/A 03/29/2020   Procedure: VENO VENOUS BYPASS;  Surgeon: Waynetta Sandy, MD;  Location: [place];  Service: Vascular;  Laterality: N/A;   APPLICATION OF [place] N/A 03/29/2020   Procedure: [place];  Surgeon: Waynetta Sandy, MD;  Location: [place];  Service: Vascular;  Laterality: N/A;   CESAREAN SECTION  2004/01/30    COLON SURGERY     ENDOVASCULAR STENT INSERTION N/A 03/29/2020   Procedure: Inferior Vena Cava WallStent Graft Insertion;  Surgeon: Waynetta Sandy, MD;  Location: [place];  Service: Vascular;  Laterality: N/A;   INSERTION OF VENA CAVA FILTER Right 01/30/2011   none     PORT-A-CATH REMOVAL Right 12/22/2012   Procedure: REMOVAL PORT-A-CATH;  Surgeon: Odis Hollingshead, MD;  Location: [place];  Service: General;  Laterality: Right;   PORTACATH PLACEMENT  02/21/11   tip in mid svc-rosenbower   RECTAL TUMOR BY PROCTOTOMY EXCISION     THROMBECTOMY ILIAC ARTERY N/A 03/29/2020   Procedure: THROMBECTOMY ILIAC ARTERY MECHANICAL THROMBECTOMY;  Surgeon: Waynetta Sandy, MD;  [facility];  Service: Vascular;  Laterality: N/A;    Current Medications: No outpatient medications have been marked as taking for the 10/04/21 encounter (Appointment) with Freada Bergeron, MD.     Allergies:   Metformin and related and Penicillins   Social History   Socioeconomic History   Marital status: Married    Spouse name: Not on file   Number of children: 1   Years of education: Not on file   Highest education level: Not on file  Occupational History   Occupation: Engineer, structural  Tobacco Use   Smoking status: Never   Smokeless tobacco: Never  Substance and Sexual Activity   Alcohol  use: Yes    Comment: rare- twice a year   Drug use: No   Sexual activity: Not Currently    Comment: no periods due to radiation  Other Topics Concern   Not on file  Social History Narrative   Not on file   Social Determinants of Health   Financial Resource Strain: Not on file  Food Insecurity: Not on file  Transportation Needs: Not on file  Physical Activity: Not on file  Stress: Not on file  Social Connections: Not on file     Family History: The patient's ***family history includes Brain cancer in her mother; Cervical cancer in her paternal grandmother; Diabetes in her brother and  mother; Heart disease in her father; Hypertension in her mother; Kidney disease in her brother; Lung cancer in her mother. There is no history of Breast cancer.  ROS:   Please see the history of present illness.    *** All other systems reviewed and are negative.  EKGs/Labs/Other Studies Reviewed:    The following studies were reviewed today: Echocardiogram - 08/10/2014 Study Conclusions  - Left ventricle: The cavity size was normal. Systolic function was   normal. The estimated ejection fraction was in the range of 60%   to 65%. Wall motion was normal; there were no regional wall   motion abnormalities. There was an increased relative   contribution of atrial contraction to ventricular filling.   Doppler parameters are consistent with abnormal left ventricular   relaxation (grade 1 diastolic dysfunction). - Mitral valve: There was mild regurgitation. - Tricuspid valve: There was trivial regurgitation.     Exercise treadmill stress test: 08/10/2014 Comments: Good exercise capacity. No chest pain. Normal BP response to exercise. There was increased motion artifact. There was non-specific up-sloping ST depression.  No ST changes to suggest ischemia.  No exercise induced ventricular arrhythmias.       EKG:  EKG is *** ordered today.  The ekg ordered today demonstrates ***  Recent Labs: 12/05/2020: TSH 1.860 12/27/2020: Hemoglobin 14.6; Platelets 203 05/01/2021: ALT 20 09/11/2021: BUN 20; Creatinine, Ser 0.80; Potassium 4.3; Sodium 144  Recent Lipid Panel    Component Value Date/Time   CHOL 215 (H) 05/01/2021 1002   TRIG 108 05/01/2021 1002   HDL 61 05/01/2021 1002   CHOLHDL 3.5 05/01/2021 1002   CHOLHDL 3.9 10/27/2015 0904   VLDL 18 10/27/2015 0904   LDLCALC 135 (H) 05/01/2021 1002     Risk Assessment/Calculations:   {Does this patient have ATRIAL FIBRILLATION?[phone]}       Physical Exam:    VS:  LMP 09/12/2011     Wt Readings from Last 3 Encounters:   09/11/21 196 lb 6.4 oz (89.1 kg)  05/01/21 195 lb 3.2 oz (88.5 kg)  02/19/21 192 lb (87.1 kg)     GEN: *** Well nourished, well developed in no acute distress HEENT: Normal NECK: No JVD; No carotid bruits LYMPHATICS: No lymphadenopathy CARDIAC: ***RRR, no murmurs, rubs, gallops RESPIRATORY:  Clear to auscultation without rales, wheezing or rhonchi  ABDOMEN: Soft, non-tender, non-distended MUSCULOSKELETAL:  No edema; No deformity  SKIN: Warm and dry NEUROLOGIC:  Alert and oriented x 3 PSYCHIATRIC:  Normal affect   ASSESSMENT:    No diagnosis found. PLAN:    In order of problems listed above:  #Palpitations: Had acute worsening of palpitations over the Holidays. Saw PCP and was told to decrease caffeine  #Family History of HCM: Patient's father passed from [place] in 2006 with autopsy revealing HCM. No  evidence of HCM on patient's TTE in 2015 or on ECG. She has otherwise been stable.  #History of PE Developed in 2012 when had colon cancer -Continue apixaban 5mg  BID  #History DVT: Developed during COVID and underwent angiojet mechanical thrombectomy. -On indefinite apixaban 5mg  PO BID  #HLD: -Continue simva 10mg  daily  #DMII: Managed by PCP. -On insulin -Continue jardiance 10mg  daily      {Are you ordering a CV Procedure (e.g. stress test, cath, DCCV, TEE, etc)?   Press F2        :[id number]}    Medication Adjustments/Labs and Tests Ordered: Current medicines are reviewed at length with the patient today.  Concerns regarding medicines are outlined above.  No orders of the defined types were placed in this encounter.  No orders of the defined types were placed in this encounter.   There are no Patient Instructions on file for this visit.   Signed, Freada Bergeron, MD  09/29/2021 6:23 PM    [place]

## 2021-10-04 ENCOUNTER — Ambulatory Visit: Payer: No Typology Code available for payment source | Admitting: Cardiology

## 2021-10-04 ENCOUNTER — Other Ambulatory Visit: Payer: Self-pay

## 2021-10-04 ENCOUNTER — Encounter: Payer: Self-pay | Admitting: Cardiology

## 2021-10-04 VITALS — BP 118/80 | HR 64 | Ht 70.6 in | Wt 198.4 lb

## 2021-10-04 DIAGNOSIS — Z8249 Family history of ischemic heart disease and other diseases of the circulatory system: Secondary | ICD-10-CM | POA: Diagnosis not present

## 2021-10-04 DIAGNOSIS — I2699 Other pulmonary embolism without acute cor pulmonale: Secondary | ICD-10-CM | POA: Diagnosis not present

## 2021-10-04 DIAGNOSIS — E782 Mixed hyperlipidemia: Secondary | ICD-10-CM

## 2021-10-04 DIAGNOSIS — E119 Type 2 diabetes mellitus without complications: Secondary | ICD-10-CM

## 2021-10-04 DIAGNOSIS — I824Y9 Acute embolism and thrombosis of unspecified deep veins of unspecified proximal lower extremity: Secondary | ICD-10-CM

## 2021-10-04 DIAGNOSIS — R002 Palpitations: Secondary | ICD-10-CM | POA: Diagnosis not present

## 2021-10-04 DIAGNOSIS — Z794 Long term (current) use of insulin: Secondary | ICD-10-CM

## 2021-10-04 NOTE — Patient Instructions (Addendum)
Medication Instructions:   Your physician recommends that you continue on your current medications as directed. Please refer to the Current Medication list given to you today.  *If you need a refill on your cardiac medications before your next appointment, please call your pharmacy*   Testing/Procedures:  Your physician has requested that you have an echocardiogram. Echocardiography is a painless test that uses sound waves to create images of your heart. It provides your doctor with information about the size and shape of your heart and how well your hearts chambers and valves are working. This procedure takes approximately one hour. There are no restrictions for this procedure.   Follow-Up: At [facility], you and your health needs are our priority.  As part of our continuing mission to provide you with exceptional heart care, we have created designated Provider Care Teams.  These Care Teams include your primary Cardiologist (physician) and Advanced Practice Providers (APPs -  Physician Assistants and Nurse Practitioners) who all work together to provide you with the care you need, when you need it.  We recommend signing up for the patient portal called "MyChart".  Sign up information is provided on this After Visit Summary.  MyChart is used to connect with patients for Virtual Visits (Telemedicine).  Patients are able to view lab/test results, encounter notes, upcoming appointments, etc.  Non-urgent messages can be sent to your provider as well.   To learn more about what you can do with MyChart, go to [email or web address].    Your next appointment:   6 month(s)  The format for your next appointment:   In Person  Provider:   DR. Johney Frame   --PLEASE START WEARING COMPRESSION STOCKINGS DURING THE DAY. --[place]

## 2021-10-04 NOTE — Progress Notes (Signed)
Cardiology Office Note:    Date:  10/04/2021   ID:  [name], DOB [birth year], MRN [id number]  PCP:  [name], [place] Providers Cardiologist:  None {   Referring MD: [name], FNP     History of Present Illness:    [name] is a 55 y.o. female with a hx of DMII, HLD, family history of HCM, prior PE in Jan 05, 2011, rectal cancer s/p resection who was previously followed by Dr. Meda Coffee with last visit in Jan 05, 2015 who presents to re-establish care.   Patient initially saw Dr. Verl Blalock in 01/04/05 after her father died from SCD with autopsy revealing HCM. Both her and her brother had ECGs and TTEs at that time which were not suggestive of HCM. She had a normal echo and stress echo in 2014/01/04. She last saw Dr. Meda Coffee in 01/05/2015.  Today, the patient states that she has been having episodes of palpitations. Specifically, she developed worsening palpitations over Thanksgiving holiday. She had one day where her symptoms lasted all day long. No associated chest pain, lightheadedness, dizziness, or syncope. She followed up with her PCP who told her to cut back on caffeine which helped significantly. Currently, her palpitations have not returned.  Of note, the patient has a history of PE that developed in the setting of colon cancer in January 05, 2011. She developed LE DVTs last May when she had COVID and required angiojet and mechanical thrombectomy. 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   Otherwise, she denies any chest pain, or shortness of breath. No lightheadedness, headaches, syncope, orthopnea, PND, lower or extremity edema. She is not as active as she would like to be but is interested in going back to the gym.  Past Medical History:  Diagnosis Date   ASCUS 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  ECC with HPV effect   Cancer of colon with rectum ([place]) 05-Jan-2011   Diabetes mellitus without complication ([place])    PZWCHENI(778.2)    experiences migraine every couple  of weeks. 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  colposcopy adequate normal 10/2015    Hyperlipidemia    Mild non proliferative diabetic retinopathy ([place]) 01/01/2021   Left eye    Pulmonary embolism ([place])    STD (sexually transmitted disease) 1991   POS GC-TREATED   Thyroid disease     Past Surgical History:  Procedure Laterality Date   AORTA - BILATERAL FEMORAL ARTERY BYPASS GRAFT N/A 03/29/2020   Procedure: VENO VENOUS BYPASS;  Surgeon: Waynetta Sandy, MD;  Location: [place];  Service: Vascular;  Laterality: N/A;   APPLICATION OF [place] N/A 03/29/2020   Procedure: [place];  Surgeon: Waynetta Sandy, MD;  Location: [place];  Service: Vascular;  Laterality: N/A;   CESAREAN SECTION  05-Jan-2004   COLON SURGERY     ENDOVASCULAR STENT INSERTION N/A 03/29/2020   Procedure: Inferior Vena Cava WallStent Graft Insertion;  Surgeon: Waynetta Sandy, MD;  Location: [place];  Service: Vascular;  Laterality: N/A;   INSERTION OF VENA CAVA FILTER Right 01/05/11   none     PORT-A-CATH REMOVAL Right 12/22/2012   Procedure: REMOVAL PORT-A-CATH;  Surgeon: Odis Hollingshead, MD;  Location: [place];  Service: General;  Laterality: Right;   PORTACATH PLACEMENT  02/21/11   tip in mid svc-rosenbower  RECTAL TUMOR BY PROCTOTOMY EXCISION     THROMBECTOMY ILIAC ARTERY N/A 03/29/2020   Procedure: THROMBECTOMY ILIAC ARTERY MECHANICAL THROMBECTOMY;  Surgeon: Waynetta Sandy, MD;  Location: [place];  Service: Vascular;  Laterality: N/A;    Current Medications: Current Meds  Medication Sig   Ascorbic Acid (VITAMIN C ADULT GUMMIES PO) Take 1 tablet by mouth daily.   cholecalciferol (VITAMIN D) 400 UNITS TABS tablet Take 400 Units by mouth daily.    cyanocobalamin 500 MCG tablet Take 500 mcg by mouth daily.   ELIQUIS 5 MG TABS  tablet TAKE 1 TABLET(5 MG) BY MOUTH TWICE DAILY   Ferrous Sulfate (IRON) 325 (65 Fe) MG TABS    Insulin Lispro (HUMALOG [place]) Inject 10 Units into the skin 3 (three) times daily with meals.    JARDIANCE 25 MG TABS tablet Take 25 mg by mouth daily.   levothyroxine (SYNTHROID, LEVOTHROID) 100 MCG tablet Take 100 mcg by mouth daily before breakfast.    Magnesium 500 MG TABS    Multiple Vitamin (MULTIVITAMIN) tablet Take 1 tablet by mouth daily.   POTASSIUM PO Take by mouth.   rosuvastatin (CRESTOR) 10 MG tablet Take 10 mg by mouth daily.   Zinc 100 MG TABS Take 1 tablet by mouth daily.     Allergies:   Metformin and related and Penicillins   Social History   Socioeconomic History   Marital status: Married    Spouse name: Not on file   Number of children: 1   Years of education: Not on file   Highest education level: Not on file  Occupational History   Occupation: Engineer, structural  Tobacco Use   Smoking status: Never   Smokeless tobacco: Never  Substance and Sexual Activity   Alcohol use: Yes    Comment: rare- twice a year   Drug use: No   Sexual activity: Not Currently    Comment: no periods due to radiation  Other Topics Concern   Not on file  Social History Narrative   Not on file   Social Determinants of Health   Financial Resource Strain: Not on file  Food Insecurity: Not on file  Transportation Needs: Not on file  Physical Activity: Not on file  Stress: Not on file  Social Connections: Not on file     Family History: The patient's family history includes Brain cancer in her mother; Cervical cancer in her paternal grandmother; Diabetes in her brother and mother; Heart disease in her father; Hypertension in her mother; Kidney disease in her brother; Lung cancer in her mother. There is no history of Breast cancer.  Her father also had an enlarged heart. Her brother has thickening of the walls of the arteries/heart. Her son has had heart problems as well.  ROS:    Please see the history of present illness.    (+) Palpitations (+) Near Syncopal episode  (+) Stress (+) Fatigue (with exercise) All other systems reviewed and are negative.  EKGs/Labs/Other Studies Reviewed:    The following studies were reviewed today: Echocardiogram - 08/10/2014 Study Conclusions  - Left ventricle: The cavity size was normal. Systolic function was   normal. The estimated ejection fraction was in the range of 60%   to 65%. Wall motion was normal; there were no regional wall   motion abnormalities. There was an increased relative   contribution of atrial contraction to ventricular filling.   Doppler parameters are consistent with abnormal left ventricular   relaxation (grade 1 diastolic dysfunction). -  Mitral valve: There was mild regurgitation. - Tricuspid valve: There was trivial regurgitation.     Exercise treadmill stress test: 08/10/2014 Comments: Good exercise capacity. No chest pain. Normal BP response to exercise. There was increased motion artifact. There was non-specific up-sloping ST depression.  No ST changes to suggest ischemia.  No exercise induced ventricular arrhythmias.   EKG:  10/04/21: Sinus Rhythm. Rate 64 bpm.  Recent Labs: 12/05/2020: TSH 1.860 12/27/2020: Hemoglobin 14.6; Platelets 203 05/01/2021: ALT 20 09/11/2021: BUN 20; Creatinine, Ser 0.80; Potassium 4.3; Sodium 144  Recent Lipid Panel    Component Value Date/Time   CHOL 215 (H) 05/01/2021 1002   TRIG 108 05/01/2021 1002   HDL 61 05/01/2021 1002   CHOLHDL 3.5 05/01/2021 1002   CHOLHDL 3.9 10/27/2015 0904   VLDL 18 10/27/2015 0904   LDLCALC 135 (H) 05/01/2021 1002     Risk Assessment/Calculations:           Physical Exam:    VS:  BP 118/80    Pulse 64    Ht 5' 10.6" (1.793 m)    Wt 198 lb 6.4 oz (90 kg)    LMP 09/12/2011    SpO2 95%    BMI 27.99 kg/m     Wt Readings from Last 3 Encounters:  10/04/21 198 lb 6.4 oz (90 kg)  09/11/21 196 lb 6.4 oz (89.1 kg)   05/01/21 195 lb 3.2 oz (88.5 kg)     GEN:  Well nourished, well developed in no acute distress HEENT: Normal NECK: No JVD; No carotid bruits CARDIAC: RRR, no murmurs, rubs, gallops RESPIRATORY:  Clear to auscultation without rales, wheezing or rhonchi  ABDOMEN: Soft, non-tender, non-distended MUSCULOSKELETAL:  No edema; No deformity  SKIN: Warm and dry NEUROLOGIC:  Alert and oriented x 3 PSYCHIATRIC:  Normal affect   ASSESSMENT:    1. Family history of hypertrophic cardiomyopathy   2. Deep vein thrombosis (DVT) of proximal lower extremity, unspecified chronicity, unspecified laterality ([place])   3. Bilateral pulmonary embolism (HCC)   4. Palpitations   5. Mixed hyperlipidemia   6. Type 2 diabetes mellitus without complication, with long-term current use of insulin (HCC)    PLAN:    In order of problems listed above:  #Palpitations: Had acute worsening of palpitations over the Holidays. Saw PCP and was told to decrease caffeine with significant improvement. No current symptoms. Discussed the option of a monitor but given lack of symptoms, will wait to see if they recur. She will buy a Kardia device and monitor at home.  -No symptoms currently, will plan on monitor if recur -Discussed option of Kardia device at home for monitoring as well -Continue hydration -Decrease caffeine (has helped significantly) -Magnesium at night -Check TTE for screening given history of HCM   #Family History of HCM: Patient's father passed from [place] in 2006 with autopsy revealing HCM. No evidence of HCM on patient's TTE in 2015 or on ECG. She has otherwise been stable. -Repeat TTE for monitoring (will repeat every 5 years)   #History of PE Developed in 2012 when had colon cancer. 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 -Continue apixaban 5mg  BID   #History DVT: Developed when she had COVID in 02/2021 and underwent angiojet mechanical thrombectomy with Dr. Donzetta Matters. Now on lifelong AC -On  indefinite apixaban 5mg  PO BID -Follow-up with Heme    #HLD: LDL 135. Goal <100. Managed by PCP -Changed from simva to crestor 10mg  daily -Repeat lipids at next visit or with  PCP   #DMII: Managed by PCP. -On insulin -Continue jardiance 10mg  daily     F/U 6 in months  Medication Adjustments/Labs and Tests Ordered: Current medicines are reviewed at length with the patient today.  Concerns regarding medicines are outlined above.  Orders Placed This Encounter  Procedures   EKG 12-Lead   ECHOCARDIOGRAM COMPLETE   No orders of the defined types were placed in this encounter.   Patient Instructions  Medication Instructions:   Your physician recommends that you continue on your current medications as directed. Please refer to the Current Medication list given to you today.  *If you need a refill on your cardiac medications before your next appointment, please call your pharmacy*   Testing/Procedures:  Your physician has requested that you have an echocardiogram. Echocardiography is a painless test that uses sound waves to create images of your heart. It provides your doctor with information about the size and shape of your heart and how well your hearts chambers and valves are working. This procedure takes approximately one hour. There are no restrictions for this procedure.   Follow-Up: At [facility], you and your health needs are our priority.  As part of our continuing mission to provide you with exceptional heart care, we have created designated Provider Care Teams.  These Care Teams include your primary Cardiologist (physician) and Advanced Practice Providers (APPs -  Physician Assistants and Nurse Practitioners) who all work together to provide you with the care you need, when you need it.  We recommend signing up for the patient portal called "MyChart".  Sign up information is provided on this After Visit Summary.  MyChart is used to connect with patients for Virtual  Visits (Telemedicine).  Patients are able to view lab/test results, encounter notes, upcoming appointments, etc.  Non-urgent messages can be sent to your provider as well.   To learn more about what you can do with MyChart, go to [email or web address].    Your next appointment:   6 month(s)  The format for your next appointment:   In Person  Provider:   DR. Johney Frame   --PLEASE START [place] as a scribe for Freada Bergeron, MD.,have documented all relevant documentation on the behalf of Freada Bergeron, MD,as directed by  Freada Bergeron, MD while in the presence of Freada Bergeron, MD.   I, Freada Bergeron, MD, have reviewed all documentation for this visit. The documentation on 10/04/21 for the exam, diagnosis, procedures, and orders are all accurate and complete.   Signed, Freada Bergeron, MD  10/04/2021 9:01 AM    [place]

## 2021-10-04 NOTE — Addendum Note (Signed)
Addended by: Gwyndolyn Kaufman on: 10/04/2021 09:14 AM   Modules accepted: Level of Service

## 2021-11-01 ENCOUNTER — Other Ambulatory Visit (HOSPITAL_COMMUNITY): Payer: No Typology Code available for payment source

## 2021-11-09 ENCOUNTER — Other Ambulatory Visit: Payer: Self-pay

## 2021-11-09 ENCOUNTER — Ambulatory Visit (HOSPITAL_COMMUNITY): Payer: No Typology Code available for payment source | Attending: Cardiovascular Disease

## 2021-11-09 DIAGNOSIS — E119 Type 2 diabetes mellitus without complications: Secondary | ICD-10-CM | POA: Diagnosis not present

## 2021-11-09 DIAGNOSIS — E785 Hyperlipidemia, unspecified: Secondary | ICD-10-CM | POA: Diagnosis not present

## 2021-11-09 DIAGNOSIS — R0609 Other forms of dyspnea: Secondary | ICD-10-CM | POA: Insufficient documentation

## 2021-11-09 DIAGNOSIS — Z8616 Personal history of COVID-19: Secondary | ICD-10-CM | POA: Insufficient documentation

## 2021-11-09 DIAGNOSIS — Z8619 Personal history of other infectious and parasitic diseases: Secondary | ICD-10-CM | POA: Insufficient documentation

## 2021-11-09 DIAGNOSIS — E669 Obesity, unspecified: Secondary | ICD-10-CM | POA: Diagnosis not present

## 2021-11-09 DIAGNOSIS — Z8249 Family history of ischemic heart disease and other diseases of the circulatory system: Secondary | ICD-10-CM | POA: Diagnosis not present

## 2021-11-09 DIAGNOSIS — I517 Cardiomegaly: Secondary | ICD-10-CM | POA: Insufficient documentation

## 2021-11-09 DIAGNOSIS — E039 Hypothyroidism, unspecified: Secondary | ICD-10-CM | POA: Insufficient documentation

## 2021-11-09 LAB — ECHOCARDIOGRAM COMPLETE
Area-P 1/2: 4.52 cm2
S' Lateral: 2.2 cm

## 2021-12-10 NOTE — Progress Notes (Signed)
I,[name],acting as a Education administrator for [name] Brine, FNP.,have documented all relevant documentation on the behalf of [name] Brine, FNP,as directed by  [name] Brine, FNP while in the presence of [name], [place].   This visit occurred during the SARS-CoV-2 public health emergency.  Safety protocols were in place, including screening questions prior to the visit, additional usage of staff PPE, and extensive cleaning of exam room while observing appropriate contact time as indicated for disinfecting solutions.  Subjective:     Patient ID: [name] , female    DOB: 03-24-1966 , 56 y.o.   MRN: [id number]   No chief complaint on file.   HPI  HM. Pt thinks she has UTI. Frequency to urinate, heaviness in bladder feeling. She has her GYN needs done at [facility]   She has seen her Cardiologist with no changes. She had a Sonogram of her heart. She had been having heart palpitations she stopped drinking coffee and grapefruit juice. Continues to be followed by Endocrinology Dr. Chalmers Cater - she manages her diabetes and thyroid. She also started her on rosuvastatin.  She is being followed by Dr. 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 He advised her she would have tingling and pain later.   Wt Readings from Last 3 Encounters: 12/11/21 : 201 lb 9.6 oz (91.4 kg) 10/04/21 : 198 lb 6.4 oz (90 kg) 09/11/21 : 196 lb 6.4 oz (89.1 kg)     Past Medical History:  Diagnosis Date   ASCUS 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  ECC with HPV effect   Cancer of colon with rectum ([place]) 2012   Diabetes mellitus without complication (HCC)    IRWERXVQ(008.6)    experiences migraine every couple of weeks. 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 , Disp: , Rfl:    cyanocobalamin 500 MCG tablet, Take 500 mcg by mouth daily., Disp: , Rfl:    ELIQUIS 5 MG TABS tablet, TAKE 1 TABLET(5 MG) BY MOUTH TWICE DAILY, Disp: 180 tablet, Rfl: 3   Ferrous Sulfate (IRON) 325 (65 Fe) MG TABS, , Disp: , Rfl:    Insulin Lispro (HUMALOG [place]), Inject 10 Units into the skin 3 (three) times daily with meals. , Disp: , Rfl:    JARDIANCE 25 MG TABS tablet, Take 25 mg by mouth daily., Disp: , Rfl:    Magnesium 500 MG TABS, , Disp: , Rfl:    POTASSIUM PO, Take by  mouth., Disp: , Rfl:    rosuvastatin (CRESTOR) 10 MG tablet, Take 10 mg by mouth daily., Disp: , Rfl:    Zinc 100 MG TABS, Take 1 tablet by mouth daily., Disp: , Rfl:    levothyroxine (SYNTHROID, LEVOTHROID) 100 MCG tablet, Take 100 mcg by mouth daily before breakfast.  (Patient not taking: Reported on 12/11/2021), Disp: , Rfl:    Multiple Vitamin (MULTIVITAMIN) tablet, Take 1 tablet by mouth daily. (Patient not taking: Reported on 12/11/2021), Disp: , Rfl:    Allergies  Allergen Reactions   Metformin And Related Other (See Comments)    Chest pain   Penicillins Hives      The patient states she is post menopausal status.  Patient's last menstrual period was 09/12/2011.. Negative for Dysmenorrhea and Negative for Menorrhagia. Negative for: breast discharge, breast  lump(s), breast pain and breast self exam. Associated symptoms include abnormal vaginal bleeding. Pertinent negatives include abnormal bleeding (hematology), anxiety, decreased libido, depression, difficulty falling sleep, dyspareunia, history of infertility, nocturia, sexual dysfunction, sleep disturbances, urinary incontinence, urinary urgency, vaginal discharge and vaginal itching. Diet regular.  The patient states her exercise level is minimal with wall Pilates - has just started.   The patient's tobacco use is:  Social History   Tobacco Use  Smoking Status Never  Smokeless Tobacco Never   She has been exposed to passive smoke. The patient's alcohol use is:  Social History   Substance and Sexual Activity  Alcohol Use Yes   Comment: rare- twice a year   Additional information: Last pap 08/23/2020, next one scheduled for 08/24/2023.    Review of Systems  Constitutional: Negative.   HENT: Negative.    Eyes: Negative.   Respiratory: Negative.    Cardiovascular: Negative.   Gastrointestinal: Negative.   Endocrine: Negative.   Genitourinary: Negative.   Musculoskeletal: Negative.   Skin: Negative.   Allergic/Immunologic: Negative.   Neurological: Negative.   Hematological: Negative.   Psychiatric/Behavioral: Negative.      Today's Vitals   12/11/21 0851  BP: 124/84  Pulse: 65  Temp: 98.1 F (36.7 C)  Weight: 201 lb 9.6 oz (91.4 kg)  Height: 5' 10"  (1.778 m)   Body mass index is 28.93 kg/m.  Wt Readings from Last 3 Encounters:  12/11/21 201 lb 9.6 oz (91.4 kg)  10/04/21 198 lb 6.4 oz (90 kg)  09/11/21 196 lb 6.4 oz (89.1 kg)    Objective:  Physical Exam Constitutional:      General: She is not in acute distress.    Appearance: Normal appearance. She is well-developed and normal weight.  HENT:     Head: Normocephalic and atraumatic.     Right Ear: Hearing, tympanic membrane, ear canal and external ear normal. There is no impacted cerumen.     Left Ear: Hearing,  tympanic membrane, ear canal and external ear normal. There is no impacted cerumen.     Nose:     Comments: Deferred - masked    Mouth/Throat:     Comments: Deferred - masked Eyes:     General: Lids are normal.     Extraocular Movements: Extraocular movements intact.     Conjunctiva/sclera: Conjunctivae normal.     Pupils: Pupils are equal, round, and reactive to light.     Funduscopic exam:    Right eye: No papilledema.        Left eye: No papilledema.  Neck:     Thyroid: No thyroid mass.     Vascular: No carotid bruit.  Cardiovascular:     Rate and Rhythm: Normal rate and regular rhythm.     Pulses: Normal pulses.     Heart sounds: Normal heart sounds. No murmur heard. Pulmonary:     Effort: Pulmonary effort is normal.     Breath sounds: Normal breath sounds.  Chest:     Chest wall: No mass.  Breasts:    Tanner Score is 5.     Right: Normal. No mass or tenderness.     Left: Normal. No mass or tenderness.  Abdominal:     General: Abdomen is flat. Bowel sounds are normal. There is no distension.     Palpations: Abdomen is soft.     Tenderness: There is no abdominal tenderness.  Genitourinary:    Comments: Deferred - managed by Gyn Musculoskeletal:        General: No swelling or tenderness. Normal range of motion.     Cervical back: Full passive range of motion without pain, normal range of motion and neck supple.     Right lower leg: No edema.     Left lower leg: No edema.  Lymphadenopathy:     Upper Body:     Right upper body: No supraclavicular, axillary or pectoral adenopathy.     Left upper body: No supraclavicular, axillary or pectoral adenopathy.  Skin:    General: Skin is warm and dry.     Capillary Refill: Capillary refill takes less than 2 seconds.  Neurological:     General: No focal deficit present.     Mental Status: She is alert and oriented to person, place, and time.     Cranial Nerves: No cranial nerve deficit.     Sensory: No sensory deficit.      Motor: No weakness.  Psychiatric:        Mood and Affect: Mood normal.        Behavior: Behavior normal.        Thought Content: Thought content normal.        Judgment: Judgment normal.        Assessment And Plan:     1. Encounter for general adult medical examination w/o abnormal findings Behavior modifications discussed and diet history reviewed.   Pt will continue to exercise regularly and modify diet with low GI, plant based foods and decrease intake of processed foods.  Recommend intake of daily multivitamin, Vitamin D, and calcium.  Recommend mammogram and colonoscopy for preventive screenings, as well as recommend immunizations that include influenza, TDAP, and Shingles  2. Encounter for screening mammogram for breast cancer Pt instructed on Self Breast Exam.According to ACOG guidelines Women aged 22 and older are recommended to get an annual mammogram. Form completed and given to patient contact the [facility] for appointment scheduling.  Pt encouraged to get annual mammogram - MM Digital Screening; Future  3. Body mass index (BMI) 28.0-28.9, adult She is doing well with her weight, continue with regular exercise at least 150 minutes a week and eating healthy diet.   4. Type 2 diabetes mellitus with both eyes affected by mild nonproliferative retinopathy without macular edema, without long-term current use of insulin (HCC) Comments: Continue follow up with Endocrinology - CMP14+EGFR - Lipid panel - POCT Urinalysis Dipstick (81002) - Microalbumin / creatinine urine ratio  5. Hypothyroidism, unspecified type Comments: Stable. Continue with follow up with Endocrinology.  - CMP14+EGFR - Lipid panel - POCT Urinalysis Dipstick (81002) - Microalbumin / creatinine urine ratio  6. Mixed hyperlipidemia Comments: Cholesterol  levels are improving, tolerating statin well. Continue current medications  7. Other long term (current) drug therapy - CBC  8. Urinary  urgency Comments: Urinalysis is positive for small blood, will send for culture - Culture, Urine  9. History of DVT (deep vein thrombosis) Comments: Advised to contact Dr. Learta Codding to have blood test for blood disorder to determine if will need long term eliquis therapy     Patient was given opportunity to ask questions. Patient verbalized understanding of the plan and was able to repeat key elements of the plan. All questions were answered to their satisfaction.   [name] Brine, FNP   I, [name] Brine, FNP, have reviewed all documentation for this visit. The documentation on 12/25/21 for the exam, diagnosis, procedures, and orders are all accurate and complete.  THE PATIENT IS ENCOURAGED TO PRACTICE SOCIAL DISTANCING DUE TO THE COVID-19 PANDEMIC.

## 2021-12-10 NOTE — Patient Instructions (Signed)

## 2021-12-11 ENCOUNTER — Other Ambulatory Visit: Payer: Self-pay

## 2021-12-11 ENCOUNTER — Encounter: Payer: Self-pay | Admitting: Nurse Practitioner

## 2021-12-11 ENCOUNTER — Ambulatory Visit (INDEPENDENT_AMBULATORY_CARE_PROVIDER_SITE_OTHER): Payer: No Typology Code available for payment source | Admitting: Nurse Practitioner

## 2021-12-11 VITALS — BP 124/84 | HR 65 | Temp 98.1°F | Ht 70.0 in | Wt 201.6 lb

## 2021-12-11 DIAGNOSIS — E039 Hypothyroidism, unspecified: Secondary | ICD-10-CM | POA: Diagnosis not present

## 2021-12-11 DIAGNOSIS — Z0000 Encounter for general adult medical examination without abnormal findings: Secondary | ICD-10-CM

## 2021-12-11 DIAGNOSIS — E119 Type 2 diabetes mellitus without complications: Secondary | ICD-10-CM

## 2021-12-11 DIAGNOSIS — Z1231 Encounter for screening mammogram for malignant neoplasm of breast: Secondary | ICD-10-CM

## 2021-12-11 DIAGNOSIS — Z6828 Body mass index (BMI) 28.0-28.9, adult: Secondary | ICD-10-CM

## 2021-12-11 DIAGNOSIS — E113293 Type 2 diabetes mellitus with mild nonproliferative diabetic retinopathy without macular edema, bilateral: Secondary | ICD-10-CM

## 2021-12-11 DIAGNOSIS — E782 Mixed hyperlipidemia: Secondary | ICD-10-CM | POA: Diagnosis not present

## 2021-12-11 DIAGNOSIS — Z79899 Other long term (current) drug therapy: Secondary | ICD-10-CM

## 2021-12-11 DIAGNOSIS — R3915 Urgency of urination: Secondary | ICD-10-CM | POA: Diagnosis not present

## 2021-12-11 DIAGNOSIS — Z86718 Personal history of other venous thrombosis and embolism: Secondary | ICD-10-CM

## 2021-12-11 LAB — POCT URINALYSIS DIPSTICK
Bilirubin, UA: NEGATIVE
Glucose, UA: POSITIVE — AB
Ketones, UA: NEGATIVE
Leukocytes, UA: NEGATIVE
Nitrite, UA: NEGATIVE
Protein, UA: NEGATIVE
Spec Grav, UA: 1.02 (ref 1.010–1.025)
Urobilinogen, UA: 0.2 E.U./dL
pH, UA: 5.5 (ref 5.0–8.0)

## 2021-12-11 LAB — LIPID PANEL
Chol/HDL Ratio: 3 ratio (ref 0.0–4.4)
Cholesterol, Total: 177 mg/dL (ref 100–199)
HDL: 60 mg/dL (ref >39)
LDL Chol Calc (NIH): 99 mg/dL (ref 0–99)
Triglycerides: 97 mg/dL (ref 0–149)
VLDL Cholesterol Cal: 18 mg/dL (ref 5–40)

## 2021-12-11 LAB — CMP14+EGFR
ALT: 16 IU/L (ref 0–32)
AST: 13 IU/L (ref 0–40)
Albumin/Globulin Ratio: 2 (ref 1.2–2.2)
Albumin: 4.9 g/dL (ref 3.8–4.9)
Alkaline Phosphatase: 118 IU/L (ref 44–121)
BUN/Creatinine Ratio: 14 (ref 9–23)
BUN: 11 mg/dL (ref 6–24)
Bilirubin Total: 0.4 mg/dL (ref 0.0–1.2)
CO2: 24 mmol/L (ref 20–29)
Calcium: 9.9 mg/dL (ref 8.7–10.2)
Chloride: 103 mmol/L (ref 96–106)
Creatinine, Ser: 0.77 mg/dL (ref 0.57–1.00)
Globulin, Total: 2.5 g/dL (ref 1.5–4.5)
Glucose: 150 mg/dL — ABNORMAL HIGH (ref 70–99)
Potassium: 4.6 mmol/L (ref 3.5–5.2)
Sodium: 140 mmol/L (ref 134–144)
Total Protein: 7.4 g/dL (ref 6.0–8.5)
eGFR: 92 mL/min/{1.73_m2} (ref >59)

## 2021-12-11 LAB — CBC
Hematocrit: 45.9 % (ref 34.0–46.6)
Hemoglobin: 14.9 g/dL (ref 11.1–15.9)
MCH: 29.1 pg (ref 26.6–33.0)
MCHC: 32.5 g/dL (ref 31.5–35.7)
MCV: 90 fL (ref 79–97)
Platelets: 205 10*3/uL (ref 150–450)
RBC: 5.12 x10E6/uL (ref 3.77–5.28)
RDW: 12.2 % (ref 11.7–15.4)
WBC: 2.7 10*3/uL — ABNORMAL LOW (ref 3.4–10.8)

## 2021-12-12 LAB — MICROALBUMIN / CREATININE URINE RATIO
Creatinine, Urine: 83.7 mg/dL
Microalb/Creat Ratio: 8 mg/g creat (ref 0–29)
Microalbumin, Urine: 6.9 ug/mL

## 2021-12-15 LAB — URINE CULTURE

## 2021-12-25 MED ORDER — NITROFURANTOIN MONOHYD MACRO 100 MG PO CAPS
100.0000 mg | ORAL_CAPSULE | Freq: Two times a day (BID) | ORAL | 0 refills | Status: AC
Start: 2021-12-25 — End: 2021-12-30

## 2021-12-26 ENCOUNTER — Other Ambulatory Visit: Payer: Self-pay

## 2021-12-26 ENCOUNTER — Inpatient Hospital Stay: Payer: No Typology Code available for payment source

## 2021-12-26 ENCOUNTER — Encounter: Payer: Self-pay | Admitting: Nurse Practitioner

## 2021-12-26 ENCOUNTER — Inpatient Hospital Stay: Payer: No Typology Code available for payment source | Attending: Nurse Practitioner | Admitting: Nurse Practitioner

## 2021-12-26 VITALS — BP 145/96 | HR 70 | Temp 97.8°F | Resp 20 | Ht 70.0 in | Wt 202.4 lb

## 2021-12-26 DIAGNOSIS — Z85048 Personal history of other malignant neoplasm of rectum, rectosigmoid junction, and anus: Secondary | ICD-10-CM | POA: Insufficient documentation

## 2021-12-26 DIAGNOSIS — Z7901 Long term (current) use of anticoagulants: Secondary | ICD-10-CM | POA: Insufficient documentation

## 2021-12-26 DIAGNOSIS — Z86711 Personal history of pulmonary embolism: Secondary | ICD-10-CM | POA: Diagnosis not present

## 2021-12-26 DIAGNOSIS — Z7982 Long term (current) use of aspirin: Secondary | ICD-10-CM | POA: Insufficient documentation

## 2021-12-26 DIAGNOSIS — Z8616 Personal history of COVID-19: Secondary | ICD-10-CM | POA: Diagnosis not present

## 2021-12-26 DIAGNOSIS — I2699 Other pulmonary embolism without acute cor pulmonale: Secondary | ICD-10-CM

## 2021-12-26 DIAGNOSIS — Z86718 Personal history of other venous thrombosis and embolism: Secondary | ICD-10-CM | POA: Diagnosis not present

## 2021-12-26 LAB — CBC WITH DIFFERENTIAL (CANCER CENTER ONLY)
Abs Immature Granulocytes: 0 10*3/uL (ref 0.00–0.07)
Basophils Absolute: 0 10*3/uL (ref 0.0–0.1)
Basophils Relative: 1 %
Eosinophils Absolute: 0 10*3/uL (ref 0.0–0.5)
Eosinophils Relative: 1 %
HCT: 44.5 % (ref 36.0–46.0)
Hemoglobin: 14.5 g/dL (ref 12.0–15.0)
Immature Granulocytes: 0 %
Lymphocytes Relative: 34 %
Lymphs Abs: 0.8 10*3/uL (ref 0.7–4.0)
MCH: 29.7 pg (ref 26.0–34.0)
MCHC: 32.6 g/dL (ref 30.0–36.0)
MCV: 91 fL (ref 80.0–100.0)
Monocytes Absolute: 0.2 10*3/uL (ref 0.1–1.0)
Monocytes Relative: 8 %
Neutro Abs: 1.4 10*3/uL — ABNORMAL LOW (ref 1.7–7.7)
Neutrophils Relative %: 56 %
Platelet Count: 187 10*3/uL (ref 150–400)
RBC: 4.89 MIL/uL (ref 3.87–5.11)
RDW: 12.2 % (ref 11.5–15.5)
WBC Count: 2.4 10*3/uL — ABNORMAL LOW (ref 4.0–10.5)
nRBC: 0 % (ref 0.0–0.2)

## 2021-12-26 LAB — D-DIMER, QUANTITATIVE: D-Dimer, Quant: <0.27 ug/mL-FEU (ref 0.00–0.50)

## 2021-12-26 LAB — SAVE SMEAR(SSMR), FOR PROVIDER SLIDE REVIEW

## 2021-12-26 NOTE — Progress Notes (Signed)
?[name] ?OFFICE PROGRESS NOTE ? ? 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 ?INTERVAL HISTORY:  ? ?[name] was last seen at the [facility] 07/19/2020.  Referral made to Dr. Joan Flores at [facility] for a recommendation regarding duration of anticoagulation.  She did not return to our office for subsequent follow-up. ? ?She continues Eliquis.  She denies bleeding except baseline intermittent rectal bleeding due to frequent bowel movements.  No change in baseline bowel habits.  She reports being up-to-date on colonoscopy surveillance.  Overall she feels well.  She is "tired".  She was recently diagnosed with a UTI.  No fevers or sweats. ? ?Objective: ? ?Vital signs in last 24 hours: ? ?Blood pressure (!) 145/96, pulse 70, temperature 97.8 ?F (36.6 ?C), temperature source Oral, resp. rate 20, height '5\' 10"'$  (1.778 m), weight 202 lb 6.4 oz (91.8 kg), last menstrual period 09/12/2011, SpO2 100 %. ?  ? ?Lymphatics: No palpable cervical, supraclavicular, axillary or inguinal lymph nodes. ?Resp: Lungs clear bilaterally. ?Cardio: Regular rate and rhythm. ?GI: No hepatosplenomegaly. ?Vascular: No leg edema. ? ? ?Lab Results: ? ?Lab Results  ?Component Value Date  ? WBC 2.7 (L) 12/11/2021  ? HGB 14.9 12/11/2021  ? HCT 45.9 12/11/2021  ? MCV 90 12/11/2021  ? PLT 205 12/11/2021  ? NEUTROABS 2.7 04/20/2020  ? ? ?Imaging: ? ?No results found. ? ?Medications: I have reviewed the patient's current medications. ? ?Assessment/Plan: ?Rectal cancer, stage III (T3 N1b), status post a low anterior resection 01/31/2011, status post radiation with continuous infusion 5-FU 03/19/2011 through 04/30/2011. Status post FOLFOX beginning 07/03/2011 for 8 cycles. Surveillance colonoscopy 02/19/2012-negative for recurrent cancer or polyps. Surveillance colonoscopy 04/15/2014-entire examined colon normal; terminal ileum appeared normal. Next colonoscopy recommended  at a 5 year interval. ?Surveillance CT scans negative 12/17/2013 ?Pulmonary embolism March 2012, status post IVC filter placement prior to cancer surgery in April 2012, followed by Dr. Chase Caller. Risk factors for pulmonary embolism felt to be related to obesity, oral contraceptive use, rectal cancer, and sedentary job.  Coumadin discontinued 2014, aspirin initiated ?Removal of Port-A-Cath March 2014 ?Urinary/fecal incontinence. Initially improved with the pelvic physical therapy program. ?Diagnosed with Covid 03/10/2020 ?Hospitalized 03/21/2020 with fatigue/malaise, low back pain.  She was found to have multifocal patchy airspace opacities throughout both lungs consistent with multifocal pneumonia on CT 03/21/2020. MRI of the thoracic/lumbar spine 03/22/2020 showed T7 disc herniation but also showed thrombosis of the IVC and both iliac veins to the level of the IVC filter.  Subsequently Doppler study showed extensive DVT in bilateral lower extremity.  She was started on IV heparin.  Anticoagulation changed to argatroban due to thrombocytopenia, HIT antibody negative 03/30/2020.  The platelet count improved.  Vascular surgery was consulted.  She underwent catheter guided lytic therapy 03/28/2020, mechanical thrombectomy, IVC filter removal which was complicated by IVC tear requiring IVC stenting 03/29/2020.  Postoperatively she developed acute blood loss anemia with hypovolemic shock, required pressors.  She stabilized and was transferred to the medical unit then developed vomiting and was ultimately found to have an ileus/partial small bowel obstruction.  She was transitioned to Eliquis which she continues. 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 ? ?Disposition: [name] appears stable.  She remains in clinical remission from rectal cancer.  She continues  colonoscopy surveillance with Dr. Collene Mares. ? ?She  saw Dr. Joan Flores at [facility] 01/15/2021 with the recommendation to obtain additional testing to include a D-dimer and APLA tests prior to making a determination regarding duration of anticoagulation.  She will return to the lab today.  She will continue Eliquis for now. ? ?She reports her "white count" has been low the last 3 times it was checked.  We reviewed CBCs dating back approximately 10 years.  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  We will obtain a CBC and peripheral blood smear today. ? ?She will return for follow-up in approximately 6 weeks. ? ? ? ?Ned Card ANP/GNP-BC  ? ?12/26/2021  ?8:17 AM ? ? ? ? ? ? ? ?

## 2021-12-27 LAB — LUPUS ANTICOAGULANT PANEL
DRVVT: 46.8 s (ref 0.0–47.0)
PTT Lupus Anticoagulant: 39.2 s (ref 0.0–43.5)

## 2021-12-28 LAB — CARDIOLIPIN ANTIBODIES, IGG, IGM, IGA
Anticardiolipin IgA: <9 APL U/mL (ref 0–11)
Anticardiolipin IgG: <9 GPL U/mL (ref 0–14)
Anticardiolipin IgM: <9 MPL U/mL (ref 0–12)

## 2021-12-28 LAB — BETA-2-GLYCOPROTEIN I ABS, IGG/M/A
Beta-2 Glyco I IgG: <9 GPI IgG units (ref 0–20)
Beta-2-Glycoprotein I IgA: <9 GPI IgA units (ref 0–25)
Beta-2-Glycoprotein I IgM: <9 GPI IgM units (ref 0–32)

## 2021-12-31 ENCOUNTER — Encounter: Payer: Self-pay | Admitting: *Deleted

## 2022-01-14 ENCOUNTER — Other Ambulatory Visit: Payer: Self-pay | Admitting: Nurse Practitioner

## 2022-01-14 DIAGNOSIS — Z1231 Encounter for screening mammogram for malignant neoplasm of breast: Secondary | ICD-10-CM

## 2022-01-21 ENCOUNTER — Ambulatory Visit
Admission: RE | Admit: 2022-01-21 | Discharge: 2022-01-21 | Disposition: A | Discharge status: Home / Self Care | Payer: No Typology Code available for payment source | Source: Ambulatory Visit | Attending: Nurse Practitioner | Admitting: Nurse Practitioner

## 2022-01-21 DIAGNOSIS — Z1231 Encounter for screening mammogram for malignant neoplasm of breast: Secondary | ICD-10-CM

## 2022-02-07 ENCOUNTER — Inpatient Hospital Stay: Payer: No Typology Code available for payment source | Attending: Nurse Practitioner | Admitting: Oncology

## 2022-02-07 ENCOUNTER — Encounter: Payer: Self-pay | Admitting: Oncology

## 2022-02-07 VITALS — BP 140/82 | HR 68 | Temp 98.1°F | Resp 18 | Ht 70.0 in | Wt 203.8 lb

## 2022-02-07 DIAGNOSIS — Z9221 Personal history of antineoplastic chemotherapy: Secondary | ICD-10-CM | POA: Insufficient documentation

## 2022-02-07 DIAGNOSIS — I2699 Other pulmonary embolism without acute cor pulmonale: Secondary | ICD-10-CM | POA: Diagnosis not present

## 2022-02-07 DIAGNOSIS — Z85048 Personal history of other malignant neoplasm of rectum, rectosigmoid junction, and anus: Secondary | ICD-10-CM | POA: Diagnosis not present

## 2022-02-07 DIAGNOSIS — Z86718 Personal history of other venous thrombosis and embolism: Secondary | ICD-10-CM | POA: Insufficient documentation

## 2022-02-07 DIAGNOSIS — Z7901 Long term (current) use of anticoagulants: Secondary | ICD-10-CM | POA: Insufficient documentation

## 2022-02-07 NOTE — Progress Notes (Signed)
?[place] ?OFFICE PROGRESS NOTE ? ? ?Diagnosis: Rectal cancer, history of venous thrombosis ? ?INTERVAL HISTORY:  ? ?Ms. Boody returns as scheduled.  She feels well.  Good appetite.  She continues to have irregular bowel habits.  She has rectal bleeding when she has frequent bowel movements.  Imodium helps the rectal frequency. ? ?Objective: ? ?Vital signs in last 24 hours: ? ?Blood pressure 140/82, pulse 68, temperature 98.1 ?F (36.7 ?C), temperature source Oral, resp. rate 18, height '5\' 10"'$  (1.778 m), weight 203 lb 12.8 oz (92.4 kg), last menstrual period 09/12/2011, SpO2 100 %. ?  ?Lymphatics: No cervical, supraclavicular, axillary, or inguinal nodes ?Resp: Clear bilaterally ?Cardio: Regular rate and rhythm ?GI: No hepatosplenomegaly ?Vascular: No leg edema ?  ? ?Lab Results: ? ?Lab Results  ?Component Value Date  ? WBC 2.4 (L) 12/26/2021  ? HGB 14.5 12/26/2021  ? HCT 44.5 12/26/2021  ? MCV 91.0 12/26/2021  ? PLT 187 12/26/2021  ? NEUTROABS 1.4 (L) 12/26/2021  ? ? ?CMP  ?Lab Results  ?Component Value Date  ? NA 140 12/11/2021  ? K 4.6 12/11/2021  ? CL 103 12/11/2021  ? CO2 24 12/11/2021  ? GLUCOSE 150 (H) 12/11/2021  ? BUN 11 12/11/2021  ? CREATININE 0.77 12/11/2021  ? CALCIUM 9.9 12/11/2021  ? PROT 7.4 12/11/2021  ? ALBUMIN 4.9 12/11/2021  ? AST 13 12/11/2021  ? ALT 16 12/11/2021  ? ALKPHOS 118 12/11/2021  ? BILITOT 0.4 12/11/2021  ? GFRNONAA >60 12/27/2020  ? GFRAA 81 12/05/2020  ? ? ?Lab Results  ?Component Value Date  ? CEA1 <1.00 03/04/2019  ? CEA <0.5 02/12/2016  ? ? ?Medications: I have reviewed the patient's current medications. ? ? ?Assessment/Plan: ?Rectal cancer, stage III (T3 N1b), status post a low anterior resection 01/31/2011, status post radiation with continuous infusion 5-FU 03/19/2011 through 04/30/2011. Status post FOLFOX beginning 07/03/2011 for 8 cycles. Surveillance colonoscopy 02/19/2012-negative for recurrent cancer or polyps. Surveillance colonoscopy 04/15/2014-entire  examined colon normal; terminal ileum appeared normal. Next colonoscopy recommended at a 5 year interval. ?Surveillance CT scans negative 12/17/2013 ?Pulmonary embolism March 2012, status post IVC filter placement prior to cancer surgery in April 2012, followed by Dr. Chase Caller. Risk factors for pulmonary embolism felt to be related to obesity, oral contraceptive use, rectal cancer, and sedentary job.  Coumadin discontinued 2014, aspirin initiated ?Removal of Port-A-Cath March 2014 ?Urinary/fecal incontinence. Initially improved with the pelvic physical therapy program. ?Diagnosed with Covid 03/10/2020 ?Hospitalized 03/21/2020 with fatigue/malaise, low back pain.  She was found to have multifocal patchy airspace opacities throughout both lungs consistent with multifocal pneumonia on CT 03/21/2020. MRI of the thoracic/lumbar spine 03/22/2020 showed T7 disc herniation but also showed thrombosis of the IVC and both iliac veins to the level of the IVC filter.  Subsequently Doppler study showed extensive DVT in bilateral lower extremity.  She was started on IV heparin.  Anticoagulation changed to argatroban due to thrombocytopenia, HIT antibody negative 03/30/2020.  The platelet count improved.  Vascular surgery was consulted.  She underwent catheter guided lytic therapy 03/28/2020, mechanical thrombectomy, IVC filter removal which was complicated by IVC tear requiring IVC stenting 03/29/2020.  Postoperatively she developed acute blood loss anemia with hypovolemic shock, required pressors.  She stabilized and was transferred to the medical unit then developed vomiting and was ultimately found to have an ileus/partial small bowel obstruction.  She was transitioned to Eliquis which she continues. 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 ?Leukopenia/mild  neutropenia-chronic ? ? ? ?Disposition: ?Ms. Sieben is in clinical remission from rectal cancer.  She continues anticoagulation therapy after developing IVC and lower extremity DVTs.  Antiphospholipid testing and a D-dimer were negative on 12/26/2021.  She has seen Dr. Joan Flores in the past.  We will request a telehealth visit with Dr. Joan Flores to discuss the indication for continuing anticoagulation therapy. ? ?Ms. Dobransky has a history of mild leukopenia/neutropenia.  This is likely a benign normal variant. ? ?She will continue anticoagulation therapy for now and follow-up with Dr. Joan Flores.  Ms. Bango will return for an office visit in 6 months. ? ?She continues colonoscopy follow-up with Dr. Collene Mares. ? ?Betsy Coder, MD ? ?02/07/2022  ?8:32 AM ? ? ?

## 2022-03-07 ENCOUNTER — Ambulatory Visit (INDEPENDENT_AMBULATORY_CARE_PROVIDER_SITE_OTHER): Payer: No Typology Code available for payment source | Admitting: Nurse Practitioner

## 2022-03-07 ENCOUNTER — Encounter: Payer: Self-pay | Admitting: Nurse Practitioner

## 2022-03-07 VITALS — BP 120/62 | HR 77 | Temp 98.9°F | Ht 70.0 in | Wt 205.0 lb

## 2022-03-07 DIAGNOSIS — E113293 Type 2 diabetes mellitus with mild nonproliferative diabetic retinopathy without macular edema, bilateral: Secondary | ICD-10-CM

## 2022-03-07 DIAGNOSIS — E782 Mixed hyperlipidemia: Secondary | ICD-10-CM | POA: Diagnosis not present

## 2022-03-07 DIAGNOSIS — R21 Rash and other nonspecific skin eruption: Secondary | ICD-10-CM | POA: Diagnosis not present

## 2022-03-07 DIAGNOSIS — E039 Hypothyroidism, unspecified: Secondary | ICD-10-CM | POA: Diagnosis not present

## 2022-03-07 MED ORDER — HYDROXYZINE HCL 10 MG PO TABS: 10.0000 mg | ORAL_TABLET | Freq: Three times a day (TID) | ORAL | 0 refills | Status: DC | PRN

## 2022-03-07 MED ORDER — TRIAMCINOLONE ACETONIDE 40 MG/ML IJ SUSP
40.0000 mg | Freq: Once | INTRAMUSCULAR | Status: AC
  Administered 2022-03-07: 40 mg via INTRAMUSCULAR

## 2022-03-07 NOTE — Patient Instructions (Signed)
Rash, Adult  A rash is a change in the color of your skin. A rash can also change the way your skin feels. There are many different conditions and factors that can cause a rash. Follow these instructions at home: The goal of treatment is to stop the itching and keep the rash from spreading. Watch for any changes in your symptoms. Let your doctor know about them. Follow these instructions to help with your condition: Medicine Take or apply over-the-counter and prescription medicines only as told by your doctor. These may include medicines: To treat red or swollen skin (corticosteroid creams). To treat itching. To treat an allergy (oral antihistamines). To treat very bad symptoms (oral corticosteroids).  Skin care Put cool cloths (compresses) on the affected areas. Do not scratch or rub your skin. Avoid covering the rash. Make sure that the rash is exposed to air as much as possible. Managing itching and discomfort Avoid hot showers or baths. These can make itching worse. A cold shower may help. Try taking a bath with: Epsom salts. You can get these at your local pharmacy or grocery store. Follow the instructions on the package. Baking soda. Pour a small amount into the bath as told by your doctor. Colloidal oatmeal. You can get this at your local pharmacy or grocery store. Follow the instructions on the package. Try putting baking soda paste onto your skin. Stir water into baking soda until it gets like a paste. Try putting on a lotion that relieves itchiness (calamine lotion). Keep cool and out of the sun. Sweating and being hot can make itching worse. General instructions  Rest as needed. Drink enough fluid to keep your pee (urine) pale yellow. Wear loose-fitting clothing. Avoid scented soaps, detergents, and perfumes. Use gentle soaps, detergents, perfumes, and other cosmetic products. Avoid anything that causes your rash. Keep a journal to help track what causes your rash. Write  down: What you eat. What cosmetic products you use. What you drink. What you wear. This includes jewelry. Keep all follow-up visits as told by your doctor. This is important. Contact a doctor if: You sweat at night. You lose weight. You pee (urinate) more than normal. You pee less than normal, or you notice that your pee is a darker color than normal. You feel weak. You throw up (vomit). Your skin or the whites of your eyes look yellow (jaundice). Your skin: Tingles. Is numb. Your rash: Does not go away after a few days. Gets worse. You are: More thirsty than normal. More tired than normal. You have: New symptoms. Pain in your belly (abdomen). A fever. Watery poop (diarrhea). Get help right away if: You have a fever and your symptoms suddenly get worse. You start to feel mixed up (confused). You have a very bad headache or a stiff neck. You have very bad joint pains or stiffness. You have jerky movements that you cannot control (seizure). Your rash covers all or most of your body. The rash may or may not be painful. You have blisters that: Are on top of the rash. Grow larger. Grow together. Are painful. Are inside your nose or mouth. You have a rash that: Looks like purple pinprick-sized spots all over your body. Has a "bull's eye" or looks like a target. Is red and painful, causes your skin to peel, and is not from being in the sun too long. Summary A rash is a change in the color of your skin. A rash can also change the way your skin   feels. The goal of treatment is to stop the itching and keep the rash from spreading. Take or apply over-the-counter and prescription medicines only as told by your doctor. Contact a doctor if you have new symptoms or symptoms that get worse. Keep all follow-up visits as told by your doctor. This is important. This information is not intended to replace advice given to you by your health care provider. Make sure you discuss any  questions you have with your health care provider. Document Revised: 07/19/2021 Document Reviewed: 07/19/2021 Elsevier Patient Education  2023 [employer]  

## 2022-03-07 NOTE — Progress Notes (Signed)
I,Tianna Badgett,acting as a Education administrator for [employer], FNP.,have documented all relevant documentation on the behalf of [name], FNP,as directed by  [name], FNP while in the presence of [name], [place].  This visit occurred during the SARS-CoV-2 public health emergency.  Safety protocols were in place, including screening questions prior to the visit, additional usage of staff PPE, and extensive cleaning of exam room while observing appropriate contact time as indicated for disinfecting solutions.  Subjective:     Patient ID: [name] , female    DOB: 03-26-1966 , 56 y.o.   MRN: [id number]   Chief Complaint  Patient presents with   Rash    HPI  Patient presents today for treatment of rash. Started on mothers day with an area on left arm then spread more on left arm, right posterior arm, abdomen. Itches and painful. Has applied A&D ointment. Pain is more internal     Past Medical History:  Diagnosis Date   ASCUS 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  ECC with HPV effect   Cancer of colon with rectum ([place]) 2012   Diabetes mellitus without complication ([place])    HBZJIRCV(893.8)    experiences migraine every couple of weeks. 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   Left eye    Pulmonary embolism ([place])    STD (sexually transmitted disease) 1991   POS GC-TREATED   Thyroid disease      Family History  Problem Relation Age of Onset   Heart disease Father    Lung cancer Mother    Brain cancer Mother    Diabetes Mother    Hypertension Mother    Diabetes Brother    Kidney disease Brother    Cervical cancer Paternal Grandmother    Breast cancer Neg Hx      Current Outpatient Medications:     hydrOXYzine (ATARAX) 10 MG tablet, Take 1 tablet (10 mg total) by mouth 3 (three) times daily as needed., Disp: 30 tablet, Rfl: 0   Ascorbic Acid (VITAMIN C ADULT GUMMIES PO), Take 1 tablet by mouth daily., Disp: , Rfl:    cholecalciferol (VITAMIN D) 400 UNITS TABS tablet, Take 400 Units by mouth daily. , Disp: , Rfl:    cyanocobalamin 500 MCG tablet, Take 500 mcg by mouth daily., Disp: , Rfl:    ELIQUIS 5 MG TABS tablet, TAKE 1 TABLET(5 MG) BY MOUTH TWICE DAILY, Disp: 180 tablet, Rfl: 3   Ferrous Sulfate (IRON) 325 (65 Fe) MG TABS, , Disp: , Rfl:    Insulin Lispro (HUMALOG [place]), Inject 10 Units into the skin 3 (three) times daily with meals. , Disp: , Rfl:    JARDIANCE 25 MG TABS tablet, Take 25 mg by mouth daily., Disp: , Rfl:    levothyroxine (SYNTHROID, LEVOTHROID) 100 MCG tablet, Take 100 mcg by mouth daily before breakfast., Disp: , Rfl:    Magnesium 500 MG TABS, , Disp: , Rfl:    Multiple Vitamin (MULTIVITAMIN) tablet, Take 1 tablet by mouth daily., Disp: , Rfl:    POTASSIUM PO, Take by mouth., Disp: , Rfl:    rosuvastatin (CRESTOR) 10 MG tablet, Take 10 mg by mouth daily., Disp: , Rfl:    Zinc 100 MG TABS,  Take 1 tablet by mouth daily., Disp: , Rfl:    Allergies  Allergen Reactions   Metformin And Related Other (See Comments)    Chest pain   Penicillins Hives     Review of Systems  Constitutional: Negative.   Respiratory: Negative.    Cardiovascular: Negative.   Gastrointestinal: Negative.   Skin:  Positive for rash (raised rash to left upper anterior arm, posterior forearm, right posterior forearm, left abdomen and one area to forehead. Erythema present).  Neurological: Negative.   Psychiatric/Behavioral: Negative.      Today's Vitals   03/07/22 1016  BP: 120/62  Pulse: 77  Temp: 98.9 F (37.2 C)  TempSrc: Oral  Weight: 205 lb (93 kg)  Height: 5' 10"  (1.778 m)  PainSc: 4    Body mass index is 29.41 kg/m.  Wt Readings from Last 3 Encounters:  03/07/22 205 lb (93  kg)  02/07/22 203 lb 12.8 oz (92.4 kg)  12/26/21 202 lb 6.4 oz (91.8 kg)    Objective:  Physical Exam Vitals reviewed.  Constitutional:      General: She is not in acute distress.    Appearance: Normal appearance.  Pulmonary:     Effort: Pulmonary effort is normal.     Breath sounds: Normal breath sounds.  Neurological:     Mental Status: She is alert.  Psychiatric:        Mood and Affect: Mood normal.        Behavior: Behavior normal.        Thought Content: Thought content normal.        Judgment: Judgment normal.        Assessment And Plan:     1. Rash and nonspecific skin eruption Comments: viral culture sent to lab, will treat with kenalog 40 mg, advised to monitor blood sugars. Avoid scratching.  - BMP8+eGFR - CBC with Differential/Platelet - Virus Culture - hydrOXYzine (ATARAX) 10 MG tablet; Take 1 tablet (10 mg total) by mouth 3 (three) times daily as needed.  Dispense: 30 tablet; Refill: 0  2. Mixed hyperlipidemia Comments: Stable, continue cholesterol medications tolerating well  3. Hypothyroidism, unspecified type Comments: Continue follow up with Dr Chalmers Cater.   4. Type 2 diabetes mellitus with both eyes affected by mild nonproliferative retinopathy without macular edema, without long-term current use of insulin (HCC) Comments: Continue follow up with Dr Chalmers Cater - triamcinolone acetonide (KENALOG-40) injection 40 mg     Patient was given opportunity to ask questions. Patient verbalized understanding of the plan and was able to repeat key elements of the plan. All questions were answered to their satisfaction.  [name], FNP   I, [name], FNP, have reviewed all documentation for this visit. The documentation on 03/07/22 for the exam, diagnosis, procedures, and orders are all accurate and complete.   IF YOU HAVE BEEN REFERRED TO A SPECIALIST, IT MAY TAKE 1-2 WEEKS TO SCHEDULE/PROCESS THE REFERRAL. IF YOU HAVE NOT HEARD FROM US/SPECIALIST IN TWO WEEKS,  PLEASE GIVE [place] A CALL AT [phone].   THE PATIENT IS ENCOURAGED TO PRACTICE SOCIAL DISTANCING DUE TO THE COVID-19 PANDEMIC.

## 2022-03-08 LAB — BMP8+EGFR
BUN/Creatinine Ratio: 20 (ref 9–23)
BUN: 17 mg/dL (ref 6–24)
CO2: 23 mmol/L (ref 20–29)
Calcium: 9.9 mg/dL (ref 8.7–10.2)
Chloride: 107 mmol/L — ABNORMAL HIGH (ref 96–106)
Creatinine, Ser: 0.86 mg/dL (ref 0.57–1.00)
Glucose: 152 mg/dL — ABNORMAL HIGH (ref 70–99)
Potassium: 4.7 mmol/L (ref 3.5–5.2)
Sodium: 143 mmol/L (ref 134–144)
eGFR: 80 mL/min/{1.73_m2} (ref >59)

## 2022-03-08 LAB — CBC WITH DIFFERENTIAL/PLATELET
Basophils Absolute: 0 10*3/uL (ref 0.0–0.2)
Basos: 1 %
EOS (ABSOLUTE): 0.1 10*3/uL (ref 0.0–0.4)
Eos: 4 %
Hematocrit: 41.9 % (ref 34.0–46.6)
Hemoglobin: 14.1 g/dL (ref 11.1–15.9)
Immature Grans (Abs): 0 10*3/uL (ref 0.0–0.1)
Immature Granulocytes: 0 %
Lymphocytes Absolute: 1.1 10*3/uL (ref 0.7–3.1)
Lymphs: 35 %
MCH: 29.9 pg (ref 26.6–33.0)
MCHC: 33.7 g/dL (ref 31.5–35.7)
MCV: 89 fL (ref 79–97)
Monocytes Absolute: 0.2 10*3/uL (ref 0.1–0.9)
Monocytes: 6 %
Neutrophils Absolute: 1.7 10*3/uL (ref 1.4–7.0)
Neutrophils: 54 %
Platelets: 186 10*3/uL (ref 150–450)
RBC: 4.72 x10E6/uL (ref 3.77–5.28)
RDW: 12.6 % (ref 11.7–15.4)
WBC: 3.2 10*3/uL — ABNORMAL LOW (ref 3.4–10.8)

## 2022-03-11 ENCOUNTER — Other Ambulatory Visit: Payer: Self-pay | Admitting: Nurse Practitioner

## 2022-03-11 DIAGNOSIS — L03114 Cellulitis of left upper limb: Secondary | ICD-10-CM

## 2022-03-11 MED ORDER — SULFAMETHOXAZOLE-TRIMETHOPRIM 800-160 MG PO TABS: 1.0000 | ORAL_TABLET | Freq: Two times a day (BID) | ORAL | 0 refills | Status: DC

## 2022-03-11 MED ORDER — TRIAMCINOLONE ACETONIDE 0.5 % EX OINT: 1.0000 "application " | TOPICAL_OINTMENT | Freq: Two times a day (BID) | CUTANEOUS | 0 refills | Status: DC

## 2022-03-15 LAB — VIRUS CULTURE

## 2022-03-31 NOTE — Progress Notes (Unsigned)
Cardiology Office Note:    Date:  03/31/2022   ID:  [name], DOB [birth year], MRN [id number]  PCP:  [name], FNP   [facility] Providers Cardiologist:  None {   Referring MD: [name], FNP     History of Present Illness:    TAVI GAUGHRAN is a 56 y.o. female with a hx of DMII, HLD, family history of HCM, prior PE in 23-Jan-2011, rectal cancer s/p resection who was previously followed by Dr. Meda Coffee who now presents to clinic for follow-up.   Patient initially saw Dr. Verl Blalock in 01-22-2005 after her father died from SCD with autopsy revealing HCM. Both her and her brother had ECGs and TTEs at that time which were not suggestive of HCM. She had a normal echo and stress echo in 2014/01/22. She last saw Dr. Meda Coffee in 01/23/2015.  Of note, the patient has a history of PE that developed in the setting of colon cancer in 01-23-2011. 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   Was last seen in clinic 09/2021 for palpitations which had resolved with cutting back on caffeine. Zio monitor was deferred at that time due to resolution of symptoms and she was going to invest in a [place] device. We obtained TTE for monitoring on 10/2021 which showed LVEF 60-65%, mild asymmetric LVH of the basal septal segment, normal RV, no significant valve disease.   Today, ***   Past Medical History:  Diagnosis Date   ASCUS 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  ECC with HPV effect   Cancer of colon with rectum ([place]) 01-23-11   Diabetes mellitus without complication ([place])    QIHKVQQV(956.3)    experiences migraine every couple of weeks. 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  colposcopy adequate normal 10/2015    Hyperlipidemia    Mild non proliferative diabetic retinopathy  ([place]) 01/01/2021   Left eye    Pulmonary embolism ([place])    STD (sexually transmitted disease) 1991   POS GC-TREATED   Thyroid disease     Past Surgical History:  Procedure Laterality Date   AORTA - BILATERAL FEMORAL ARTERY BYPASS GRAFT N/A 03/29/2020   Procedure: VENO VENOUS BYPASS;  Surgeon: Waynetta Sandy, MD;  Location: [facility];  Service: Vascular;  Laterality: N/A;   APPLICATION OF [place] N/A 03/29/2020   Procedure: Page;  Surgeon: Waynetta Sandy, MD;  Location: [facility];  Service: Vascular;  Laterality: N/A;   CESAREAN SECTION  2004/01/23   COLON SURGERY     ENDOVASCULAR STENT INSERTION N/A 03/29/2020   Procedure: Inferior Vena Cava WallStent Graft Insertion;  Surgeon: Waynetta Sandy, MD;  Location: [facility] [place];  Service: Vascular;  Laterality: N/A;   INSERTION OF VENA CAVA FILTER Right 2011/01/23   none     PORT-A-CATH REMOVAL Right 12/22/2012   Procedure: REMOVAL PORT-A-CATH;  Surgeon: Odis Hollingshead, MD;  Location: [facility];  Service: General;  Laterality: Right;   PORTACATH PLACEMENT  02/21/11   tip in mid [id number]-[id number]   RECTAL TUMOR BY PROCTOTOMY EXCISION     THROMBECTOMY ILIAC ARTERY N/A 03/29/2020   Procedure: THROMBECTOMY ILIAC ARTERY MECHANICAL THROMBECTOMY;  Surgeon: Waynetta Sandy, MD;  Location: [facility];  Service: Vascular;  Laterality:  N/A;    Current Medications: No outpatient medications have been marked as taking for the 04/03/22 encounter (Appointment) with Freada Bergeron, MD.     Allergies:   Metformin and related and Penicillins   Social History   Socioeconomic History   Marital status: Married    Spouse name: Not on file   Number of children: 1   Years of education: Not on file   Highest education level: Not on file  Occupational History   Occupation: Engineer, structural  Tobacco Use   Smoking status: Never   Smokeless tobacco: Never  Substance and Sexual Activity   Alcohol use: Yes     Comment: rare- twice a year   Drug use: No   Sexual activity: Not Currently    Comment: no periods due to radiation  Other Topics Concern   Not on file  Social History Narrative   Not on file   Social Determinants of Health   Financial Resource Strain: Not on file  Food Insecurity: Not on file  Transportation Needs: Not on file  Physical Activity: Not on file  Stress: Not on file  Social Connections: Not on file     Family History: The patient's family history includes Brain cancer in her mother; Cervical cancer in her paternal grandmother; Diabetes in her brother and mother; Heart disease in her father; Hypertension in her mother; Kidney disease in her brother; Lung cancer in her mother. There is no history of Breast cancer.  Her father also had an enlarged heart. Her brother has thickening of the walls of the arteries/heart. Her son has had heart problems as well.  ROS:   Please see the history of present illness.    (+) Palpitations (+) Near Syncopal episode  (+) Stress (+) Fatigue (with exercise) All other systems reviewed and are negative.  EKGs/Labs/Other Studies Reviewed:    The following studies were reviewed today: TTE November 08, 2021: IMPRESSIONS     1. Left ventricular ejection fraction, by estimation, is 60 to 65%. The  left ventricle has normal function. The left ventricle has no regional  wall motion abnormalities. There is mild asymmetric left ventricular  hypertrophy of the basal and septal  segments. Left ventricular diastolic parameters were normal.   2. Right ventricular systolic function is normal. The right ventricular  size is normal.   3. The mitral valve is abnormal. Trivial mitral valve regurgitation. No  evidence of mitral stenosis.   4. The aortic valve is tricuspid. There is mild calcification of the  aortic valve. Aortic valve regurgitation is not visualized. Aortic valve  sclerosis is present, with no evidence of aortic valve stenosis.   5.  The inferior vena cava is normal in size with greater than 50%  respiratory variability, suggesting right atrial pressure of 3 mmHg.   Echocardiogram - 08/10/2014 Study Conclusions  - Left ventricle: The cavity size was normal. Systolic function was   normal. The estimated ejection fraction was in the range of 60%   to 65%. Wall motion was normal; there were no regional wall   motion abnormalities. There was an increased relative   contribution of atrial contraction to ventricular filling.   Doppler parameters are consistent with abnormal left ventricular   relaxation (grade 1 diastolic dysfunction). - Mitral valve: There was mild regurgitation. - Tricuspid valve: There was trivial regurgitation.     Exercise treadmill stress test: 08/10/2014 Comments: Good exercise capacity. No chest pain. Normal BP response to exercise. There was increased motion artifact. There  was non-specific up-sloping ST depression.  No ST changes to suggest ischemia.  No exercise induced ventricular arrhythmias.   EKG:  10/04/21: Sinus Rhythm. Rate 64 bpm.  Recent Labs: 12/11/2021: ALT 16 03/07/2022: BUN 17; Creatinine, Ser 0.86; Hemoglobin 14.1; Platelets 186; Potassium 4.7; Sodium 143  Recent Lipid Panel    Component Value Date/Time   CHOL 177 12/11/2021 0933   TRIG 97 12/11/2021 0933   HDL 60 12/11/2021 0933   CHOLHDL 3.0 12/11/2021 0933   CHOLHDL 3.9 10/27/2015 0904   VLDL 18 10/27/2015 0904   LDLCALC 99 12/11/2021 0933     Risk Assessment/Calculations:           Physical Exam:    VS:  LMP 09/12/2011     Wt Readings from Last 3 Encounters:  03/07/22 205 lb (93 kg)  02/07/22 203 lb 12.8 oz (92.4 kg)  12/26/21 202 lb 6.4 oz (91.8 kg)     GEN:  Well nourished, well developed in no acute distress HEENT: Normal NECK: No JVD; No carotid bruits CARDIAC: RRR, no murmurs, rubs, gallops RESPIRATORY:  Clear to auscultation without rales, wheezing or rhonchi  ABDOMEN: Soft, non-tender,  non-distended MUSCULOSKELETAL:  No edema; No deformity  SKIN: Warm and dry NEUROLOGIC:  Alert and oriented x 3 PSYCHIATRIC:  Normal affect   ASSESSMENT:    No diagnosis found.  PLAN:    In order of problems listed above:  #Palpitations: Significantly improved with cutting back on caffeine. Has Kardia device for monitoring if needed.  -No symptoms currently, will plan on monitor if recur -Discussed option of Kardia device at home for monitoring as well -Continue hydration -Decrease caffeine (has helped significantly) -Magnesium at night   #Family History of HCM: Patient's father passed from [place] in 2006 with autopsy revealing HCM. TTE 10/2021 with no evidence of HCM (mild asymmetric LVH but otherwise no concerning symptoms). Will continue serial monitoring.  -Repeat TTE for monitoring (will repeat every 5 years)   #History of PE Developed in 2012 when had colon cancer. 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 -Continue apixaban '5mg'$  BID   #History DVT: Developed when she had COVID in 02/2021 and underwent angiojet mechanical thrombectomy with Dr. Donzetta Matters. Now on lifelong AC -On indefinite apixaban '5mg'$  PO BID -Follow-up with Heme    #HLD: LDL 135. Goal <100. Managed by PCP -Changed from simva to crestor '10mg'$  daily -Repeat lipids at next visit or with PCP   #DMII: Managed by PCP. -On insulin -Continue jardiance '10mg'$  daily     F/U 6 in months  Medication Adjustments/Labs and Tests Ordered: Current medicines are reviewed at length with the patient today.  Concerns regarding medicines are outlined above.  No orders of the defined types were placed in this encounter.  No orders of the defined types were placed in this encounter.    There are no Patient Instructions on file for this visit.      Signed, Freada Bergeron, MD  03/31/2022 7:25 PM    [place]

## 2022-04-03 ENCOUNTER — Ambulatory Visit (INDEPENDENT_AMBULATORY_CARE_PROVIDER_SITE_OTHER): Payer: No Typology Code available for payment source | Admitting: Cardiology

## 2022-04-03 ENCOUNTER — Encounter: Payer: Self-pay | Admitting: Cardiology

## 2022-04-03 VITALS — BP 122/80 | HR 70 | Ht 70.0 in | Wt 204.6 lb

## 2022-04-03 DIAGNOSIS — I824Y9 Acute embolism and thrombosis of unspecified deep veins of unspecified proximal lower extremity: Secondary | ICD-10-CM | POA: Diagnosis not present

## 2022-04-03 DIAGNOSIS — E782 Mixed hyperlipidemia: Secondary | ICD-10-CM

## 2022-04-03 DIAGNOSIS — I2699 Other pulmonary embolism without acute cor pulmonale: Secondary | ICD-10-CM

## 2022-04-03 DIAGNOSIS — R002 Palpitations: Secondary | ICD-10-CM

## 2022-04-03 DIAGNOSIS — Z794 Long term (current) use of insulin: Secondary | ICD-10-CM

## 2022-04-03 DIAGNOSIS — Z8249 Family history of ischemic heart disease and other diseases of the circulatory system: Secondary | ICD-10-CM

## 2022-04-03 DIAGNOSIS — Z86718 Personal history of other venous thrombosis and embolism: Secondary | ICD-10-CM

## 2022-04-03 DIAGNOSIS — E119 Type 2 diabetes mellitus without complications: Secondary | ICD-10-CM

## 2022-04-03 NOTE — Progress Notes (Signed)
Cardiology Office Note:    Date:  04/03/2022   ID:  [name], DOB 1965/12/11, MRN [id number]  PCP:  [name], FNP   [facility] Providers Cardiologist:  None {   Referring MD: [name], FNP     History of Present Illness:    [name] is a 56 y.o. female with a hx of DMII, HLD, family history of HCM, prior PE in January 24, 2011, rectal cancer s/p resection who was previously followed by Dr. Meda Coffee who now presents to clinic for follow-up.   Patient initially saw Dr. Verl Blalock in January 23, 2005 after her father died from SCD with autopsy revealing HCM. Both her and her brother had ECGs and TTEs at that time which were not suggestive of HCM. She had a normal echo and stress echo in January 23, 2014. She last saw Dr. Meda Coffee in Jan 24, 2015.  Of note, the patient has a history of PE that developed in the setting of colon cancer in 01-24-11. 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   Was last seen in clinic 09/2021 for palpitations which had resolved with cutting back on caffeine. Zio monitor was deferred at that time due to resolution of symptoms and she was going to invest in a [place] device. We obtained TTE for monitoring on 10/2021 which showed LVEF 60-65%, mild asymmetric LVH of the basal septal segment, normal RV, no significant valve disease.   Today, she state she is doing well. Palpitations are significantly improved. No chest pain, SOB, orthopnea or PND.  She still experiences paresthesia in her lower extremities, and adds that she feels uncomfortable cramps at night which improved with Mg and K supplementation.   She remains compliant with her medications including her apixaban. No bleeding issues.  The patient denies shortness of breath, nocturnal dyspnea, orthopnea or peripheral edema.  There have been no  lightheadedness or syncope.   Past Medical History:  Diagnosis Date   ASCUS with positive high  risk HPV cervical 11/2018   Colposcopy normal.  ECC with HPV effect   Cancer of colon with rectum ([place]) Jan 24, 2011   Diabetes mellitus without complication ([place])    GBTDVVOH(607.3)    experiences migraine every couple of weeks. 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  colposcopy adequate normal 10/2015    Hyperlipidemia    Mild non proliferative diabetic retinopathy ([place]) 01/01/2021   Left eye    Pulmonary embolism ([place])    STD (sexually transmitted disease) 1991   POS GC-TREATED   Thyroid disease     Past Surgical History:  Procedure Laterality Date   AORTA - BILATERAL FEMORAL ARTERY BYPASS GRAFT N/A 03/29/2020   Procedure: VENO VENOUS BYPASS;  Surgeon: Waynetta [place], MD;  Location: [place];  Service: Vascular;  Laterality: N/A;   APPLICATION OF [place] N/A 03/29/2020   Procedure: [place];  Surgeon: Waynetta [place], MD;  Location: [place];  Service: Vascular;  Laterality: N/A;   CESAREAN SECTION  2004-01-24   COLON SURGERY     ENDOVASCULAR STENT INSERTION N/A 03/29/2020   Procedure: Inferior Vena Cava WallStent Graft Insertion;  Surgeon: Waynetta [place], MD;  Location: [place];  Service: Vascular;  Laterality: N/A;   INSERTION OF VENA CAVA FILTER Right 2011/01/24   none     PORT-A-CATH  REMOVAL Right 12/22/2012   Procedure: REMOVAL PORT-A-CATH;  Surgeon: Odis Hollingshead, MD;  Location: [place];  Service: General;  Laterality: Right;   PORTACATH PLACEMENT  02/21/11   tip in mid svc-rosenbower   RECTAL TUMOR BY PROCTOTOMY EXCISION     THROMBECTOMY ILIAC ARTERY N/A 03/29/2020   Procedure: THROMBECTOMY ILIAC ARTERY MECHANICAL THROMBECTOMY;  Surgeon: Waynetta Sandy, MD;  Location: [place];  Service: Vascular;  Laterality: N/A;    Current Medications: Current Meds  Medication Sig   Ascorbic Acid (VITAMIN C  ADULT GUMMIES PO) Take 1 tablet by mouth daily.   cholecalciferol (VITAMIN D) 400 UNITS TABS tablet Take 400 Units by mouth daily.    cyanocobalamin 500 MCG tablet Take 500 mcg by mouth daily.   ELIQUIS 5 MG TABS tablet TAKE 1 TABLET(5 MG) BY MOUTH TWICE DAILY   Ferrous Sulfate (IRON) 325 (65 Fe) MG TABS    Insulin Lispro (HUMALOG [place]) Inject 10 Units into the skin 3 (three) times daily with meals.    insulin NPH-regular Human (70-30) 100 UNIT/ML injection Inject 15 Units into the skin 3 (three) times daily.   JARDIANCE 25 MG TABS tablet Take 25 mg by mouth daily.   levothyroxine (SYNTHROID, LEVOTHROID) 100 MCG tablet Take 100 mcg by mouth daily before breakfast.   Magnesium 500 MG TABS    Multiple Vitamin (MULTIVITAMIN) tablet Take 1 tablet by mouth daily.   POTASSIUM PO Take by mouth.   rosuvastatin (CRESTOR) 10 MG tablet Take 10 mg by mouth daily.   Zinc 100 MG TABS Take 1 tablet by mouth daily.     Allergies:   Metformin and related and Penicillins   Social History   Socioeconomic History   Marital status: Married    Spouse name: Not on file   Number of children: 1   Years of education: Not on file   Highest education level: Not on file  Occupational History   Occupation: Engineer, structural  Tobacco Use   Smoking status: Never   Smokeless tobacco: Never  Substance and Sexual Activity   Alcohol use: Yes    Comment: rare- twice a year   Drug use: No   Sexual activity: Not Currently    Comment: no periods due to radiation  Other Topics Concern   Not on file  Social History Narrative   Not on file   Social Determinants of Health   Financial Resource Strain: Not on file  Food Insecurity: Not on file  Transportation Needs: Not on file  Physical Activity: Not on file  Stress: Not on file  Social Connections: Not on file     Family History: The patient's family history includes Brain cancer in her mother; Cervical cancer in her paternal grandmother; Diabetes in her  brother and mother; Heart disease in her father; Hypertension in her mother; Kidney disease in her brother; Lung cancer in her mother. There is no history of Breast cancer.  Her father also had an enlarged heart. Her brother has thickening of the walls of the arteries/heart. Her son has had heart problems as well.  ROS:   Please see the history of present illness.    (+) Palpitations (+) Tingling in her LE  EKGs/Labs/Other Studies Reviewed:    The following studies were reviewed today: TTE Nov 30, 2021: IMPRESSIONS     1. Left ventricular ejection fraction, by estimation, is 60 to 65%. The  left ventricle has normal function. The left ventricle has no regional  wall motion  abnormalities. There is mild asymmetric left ventricular  hypertrophy of the basal and septal  segments. Left ventricular diastolic parameters were normal.   2. Right ventricular systolic function is normal. The right ventricular  size is normal.   3. The mitral valve is abnormal. Trivial mitral valve regurgitation. No  evidence of mitral stenosis.   4. The aortic valve is tricuspid. There is mild calcification of the  aortic valve. Aortic valve regurgitation is not visualized. Aortic valve  sclerosis is present, with no evidence of aortic valve stenosis.   5. The inferior vena cava is normal in size with greater than 50%  respiratory variability, suggesting right atrial pressure of 3 mmHg.   Echocardiogram - 08/10/2014 Study Conclusions  - Left ventricle: The cavity size was normal. Systolic function was   normal. The estimated ejection fraction was in the range of 60%   to 65%. Wall motion was normal; there were no regional wall   motion abnormalities. There was an increased relative   contribution of atrial contraction to ventricular filling.   Doppler parameters are consistent with abnormal left ventricular   relaxation (grade 1 diastolic dysfunction). - Mitral valve: There was mild regurgitation. -  Tricuspid valve: There was trivial regurgitation.     Exercise treadmill stress test: 08/10/2014 Comments: Good exercise capacity. No chest pain. Normal BP response to exercise. There was increased motion artifact. There was non-specific up-sloping ST depression.  No ST changes to suggest ischemia.  No exercise induced ventricular arrhythmias.   EKG: EKG is personally reviewed.   04/03/2022 EKG: EKG was not ordered.   10/04/21: Sinus Rhythm. Rate 64 bpm.  Recent Labs: 12/11/2021: ALT 16 03/07/2022: BUN 17; Creatinine, Ser 0.86; Hemoglobin 14.1; Platelets 186; Potassium 4.7; Sodium 143  Recent Lipid Panel    Component Value Date/Time   CHOL 177 12/11/2021 0933   TRIG 97 12/11/2021 0933   HDL 60 12/11/2021 0933   CHOLHDL 3.0 12/11/2021 0933   CHOLHDL 3.9 10/27/2015 0904   VLDL 18 10/27/2015 0904   LDLCALC 99 12/11/2021 0933     Risk Assessment/Calculations:           Physical Exam:    VS:  BP 122/80   Pulse 70   Ht '5\' 10"'$  (1.778 m)   Wt 204 lb 9.6 oz (92.8 kg)   LMP 09/12/2011   SpO2 96%   BMI 29.36 kg/m     Wt Readings from Last 3 Encounters:  04/03/22 204 lb 9.6 oz (92.8 kg)  03/07/22 205 lb (93 kg)  02/07/22 203 lb 12.8 oz (92.4 kg)     GEN:  Well nourished, well developed in no acute distress HEENT: Normal NECK: No JVD; No carotid bruits CARDIAC: RRR, no murmurs, rubs, gallops RESPIRATORY:  Clear to auscultation without rales, wheezing or rhonchi  ABDOMEN: Soft, non-tender, non-distended MUSCULOSKELETAL:  No edema; No deformity  SKIN: Warm and dry NEUROLOGIC:  Alert and oriented x 3 PSYCHIATRIC:  Normal affect   ASSESSMENT:    1. Palpitations   2. Family history of hypertrophic cardiomyopathy   3. Deep vein thrombosis (DVT) of proximal lower extremity, unspecified chronicity, unspecified laterality ([place])   4. Bilateral pulmonary embolism ([place])   5. Type 2 diabetes mellitus without complication, with long-term current use of insulin ([place])   6.  Mixed hyperlipidemia   7. History of DVT (deep vein thrombosis)     PLAN:    In order of problems listed above:  #Palpitations: Significantly improved with cutting back on caffeine.  Has Kardia device for monitoring if needed.  -No symptoms currently, will plan on monitor if recur -Discussed option of Kardia device at home for monitoring as well -Continue hydration -Decrease caffeine (has helped significantly) -Magnesium at night   #Family History of HCM: Patient's father passed from [place] in 2006 with autopsy revealing HCM. TTE 10/2021 with no evidence of HCM (mild asymmetric LVH but otherwise no concerning symptoms). Will continue serial monitoring.  -Repeat TTE for monitoring (will repeat every 5 years)   #History of PE Developed in 2012 when had colon cancer. 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 -Continue apixaban '5mg'$  BID   #History DVT: Developed when she had COVID in 02/2021 and underwent angiojet mechanical thrombectomy with Dr. Donzetta Matters. Now on lifelong AC -On indefinite apixaban '5mg'$  PO BID -Follow-up with Heme    #HLD: LDL 135. Goal <100. Managed by PCP -Continue crestor '10mg'$  daily -LDL 99 (11/2021)   #DMII: Managed by PCP. -On insulin -Continue jardiance '10mg'$  daily     F/U 1 year  Medication Adjustments/Labs and Tests Ordered: Current medicines are reviewed at length with the patient today.  Concerns regarding medicines are outlined above.  No orders of the defined types were placed in this encounter.  No orders of the defined types were placed in this encounter.   Patient Instructions  Medication Instructions:   Your physician recommends that you continue on your current medications as directed. Please refer to the Current Medication list given to you today.  *If you need a refill on your cardiac medications before your next appointment, please call your pharmacy*   Follow-Up: At [facility], you and your health needs are our priority.  As part  of our continuing mission to provide you with exceptional heart care, we have created designated Provider Care Teams.  These Care Teams include your primary Cardiologist (physician) and Advanced Practice Providers (APPs -  Physician Assistants and Nurse Practitioners) who all work together to provide you with the care you need, when you need it.  We recommend signing up for the patient portal called "MyChart".  Sign up information is provided on this After Visit Summary.  MyChart is used to connect with patients for Virtual Visits (Telemedicine).  Patients are able to view lab/test results, encounter notes, upcoming appointments, etc.  Non-urgent messages can be sent to your provider as well.   To learn more about what you can do with MyChart, go to [email or web address].    Your next appointment:   1 year(s)  The format for your next appointment:   In Person  Provider:   DR. Johney Frame   Important Information About Sugar     '      I,Tinashe Williams,acting as a Education administrator for Freada Bergeron, MD.,have documented all relevant documentation on the behalf of Freada Bergeron, MD,as directed by  Freada Bergeron, MD while in the presence of Freada Bergeron, MD.   I, Freada Bergeron, MD, have reviewed all documentation for this visit. The documentation on 04/03/22 for the exam, diagnosis, procedures, and orders are all accurate and complete.

## 2022-04-03 NOTE — Patient Instructions (Signed)
Medication Instructions:   Your physician recommends that you continue on your current medications as directed. Please refer to the Current Medication list given to you today.  *If you need a refill on your cardiac medications before your next appointment, please call your pharmacy*   Follow-Up: At [facility], you and your health needs are our priority.  As part of our continuing mission to provide you with exceptional heart care, we have created designated Provider Care Teams.  These Care Teams include your primary Cardiologist (physician) and Advanced Practice Providers (APPs -  Physician Assistants and Nurse Practitioners) who all work together to provide you with the care you need, when you need it.  We recommend signing up for the patient portal called "MyChart".  Sign up information is provided on this After Visit Summary.  MyChart is used to connect with patients for Virtual Visits (Telemedicine).  Patients are able to view lab/test results, encounter notes, upcoming appointments, etc.  Non-urgent messages can be sent to your provider as well.   To learn more about what you can do with MyChart, go to [email or web address].    Your next appointment:   1 year(s)  The format for your next appointment:   In Person  Provider:   DR. PEMBERTON  Important Information About Sugar       

## 2022-04-11 ENCOUNTER — Ambulatory Visit: Payer: No Typology Code available for payment source | Admitting: Nurse Practitioner

## 2022-05-06 NOTE — Progress Notes (Unsigned)
   [name] [birth year] [id number]   History:  56 y.o. G6P3 presents for annual exam.  Postmenopausal - No HRT, no bleeding.  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  03/2019 colonoscopy benign colon polyp 5-year follow-up.  H/O 2012 PE. DM, HTN, HLD, hypothyroidism.   Gynecologic History Patient's last menstrual period was 09/12/2011. Contraception/Family planning: post menopausal status Sexually active: ***  Health Maintenance Last Pap: 08/23/2020. Results were: ASCUS + HR HPV neg 16/18/45  Last mammogram: 01/21/2022. Results were: Normal Last colonoscopy: 03/2019. Results were: Benign polyps, 5-year recall Last Dexa: ***. Results were:    past medical history, past surgical history, family history and social history were all reviewed and documented in the EPIC chart.  Works a Designer, multimedia.  Oldest son 37 type 1 diabetes since age 25 doing better with self-care but still struggles.  Other 2 sons doing well youngest is 38 has had Gardasil.  Mother deceased from lung cancer.  ROS:  A ROS was performed and pertinent positives and negatives are included.  Exam:  There were no vitals filed for this visit.  There is no height or weight on file to calculate BMI.  General appearance:  Normal Thyroid:  Symmetrical, normal in size, without palpable masses or nodularity. Respiratory  Auscultation:  Clear without wheezing or rhonchi Cardiovascular  Auscultation:  Regular rate, without rubs, murmurs or gallops  Edema/varicosities:  Not grossly evident Abdominal  Soft,nontender, without masses, guarding or rebound.  Liver/spleen:  No organomegaly noted  Hernia:  None appreciated  Skin  Inspection:  Grossly normal Breasts: Examined lying and sitting.   Right: Without masses, retractions, nipple discharge or axillary adenopathy.   Left: Without masses, retractions, nipple discharge or axillary adenopathy. Genitourinary   Inguinal/mons:  Normal without inguinal adenopathy  External genitalia:  Normal appearing  vulva with no masses, tenderness, or lesions  BUS/Urethra/Skene's glands:  Normal  Vagina:  Normal appearing with normal color and discharge, no lesions  Cervix:  Normal appearing without discharge or lesions  Uterus:  Normal in size, shape and contour.  Midline and mobile, nontender  Adnexa/parametria:     Rt: Normal in size, without masses or tenderness.   Lt: Normal in size, without masses or tenderness.  Anus and perineum: Normal  Digital rectal exam: Normal sphincter tone without palpated masses or tenderness  Patient informed chaperone available to be present for breast and pelvic exam. Patient has requested no chaperone to be present. Patient has been advised what will be completed during breast and pelvic exam.   Assessment/Plan:  56 y.o. G6P3  for annual exam.      Tamela Gammon [facility], 2:39 PM 05/06/2022

## 2022-05-07 ENCOUNTER — Encounter: Payer: Self-pay | Admitting: Nurse Practitioner

## 2022-05-07 ENCOUNTER — Ambulatory Visit (INDEPENDENT_AMBULATORY_CARE_PROVIDER_SITE_OTHER): Payer: No Typology Code available for payment source | Admitting: Nurse Practitioner

## 2022-05-07 ENCOUNTER — Other Ambulatory Visit (HOSPITAL_COMMUNITY)
Admission: RE | Admit: 2022-05-07 | Discharge: 2022-05-07 | Disposition: A | Discharge status: Home / Self Care | Payer: No Typology Code available for payment source | Source: Ambulatory Visit | Attending: Nurse Practitioner | Admitting: Nurse Practitioner

## 2022-05-07 VITALS — BP 120/80 | HR 70 | Resp 12 | Ht 70.5 in | Wt 197.0 lb

## 2022-05-07 DIAGNOSIS — Z01419 Encounter for gynecological examination (general) (routine) without abnormal findings: Secondary | ICD-10-CM | POA: Diagnosis not present

## 2022-05-07 DIAGNOSIS — N870 Mild cervical dysplasia: Secondary | ICD-10-CM | POA: Insufficient documentation

## 2022-05-07 DIAGNOSIS — Z780 Asymptomatic menopausal state: Secondary | ICD-10-CM | POA: Diagnosis not present

## 2022-05-13 LAB — CYTOLOGY - PAP
Comment: NEGATIVE
Diagnosis: HIGH — AB
High risk HPV: POSITIVE — AB

## 2022-05-14 ENCOUNTER — Other Ambulatory Visit: Payer: Self-pay

## 2022-05-14 DIAGNOSIS — R87810 Cervical high risk human papillomavirus (HPV) DNA test positive: Secondary | ICD-10-CM

## 2022-05-14 DIAGNOSIS — I82403 Acute embolism and thrombosis of unspecified deep veins of lower extremity, bilateral: Secondary | ICD-10-CM

## 2022-05-14 DIAGNOSIS — R87611 Atypical squamous cells cannot exclude high grade squamous intraepithelial lesion on cytologic smear of cervix (ASC-H): Secondary | ICD-10-CM

## 2022-05-14 MED ORDER — APIXABAN 5 MG PO TABS: ORAL_TABLET | ORAL | 3 refills | Status: DC

## 2022-05-28 ENCOUNTER — Other Ambulatory Visit (HOSPITAL_COMMUNITY)
Admission: RE | Admit: 2022-05-28 | Discharge: 2022-05-28 | Disposition: A | Discharge status: Home / Self Care | Payer: No Typology Code available for payment source | Source: Ambulatory Visit | Attending: Obstetrics and Gynecology | Admitting: Obstetrics and Gynecology

## 2022-05-28 ENCOUNTER — Ambulatory Visit: Payer: No Typology Code available for payment source | Admitting: Obstetrics and Gynecology

## 2022-05-28 ENCOUNTER — Encounter: Payer: Self-pay | Admitting: Obstetrics and Gynecology

## 2022-05-28 VITALS — BP 110/62 | HR 88 | Ht 71.0 in | Wt 194.0 lb

## 2022-05-28 DIAGNOSIS — R87611 Atypical squamous cells cannot exclude high grade squamous intraepithelial lesion on cytologic smear of cervix (ASC-H): Secondary | ICD-10-CM

## 2022-05-28 DIAGNOSIS — R87810 Cervical high risk human papillomavirus (HPV) DNA test positive: Secondary | ICD-10-CM

## 2022-05-28 NOTE — Progress Notes (Signed)
GYNECOLOGY  VISIT   HPI: 56 y.o.   Married Black or [place] American Not Hispanic or Latino  female   [phone] with Patient's last menstrual period was 09/12/2011.   here for a colposcopy. Recent pap returned with ASC-H, +HPV.  H/O CIN I last year. Distant h/o cryosurgery of her cervix.   H/O rectal cancer in 2012.  H/O DM, HTN, PE, hypothyroidism, hyperlipidemia.   GYNECOLOGIC HISTORY: Patient's last menstrual period was 09/12/2011. Contraception:pmp Menopausal hormone therapy: none        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[place]) 01/01/2021   Sepsis due to COVID-19 ([place]) 03/22/2020   Type 2 diabetes mellitus without complication, without long-term current use of insulin ([place]) 12/03/2018   Hypothyroidism 07/08/2018   Bilateral calf pain 06/08/2015   Chest pain 08/22/2014   Hyperlipidemia 07/13/2014   Family history of hypertrophic cardiomyopathy 07/13/2014   DOE (dyspnea on exertion) 07/13/2014   Diabetes ([place]) 11/01/2013   Adiposity 11/01/2013   Obesity 11/01/2013   Diabetes mellitus    Malignant neoplasm of rectum ([place]) 08/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  ECC with HPV effect   Cancer of colon with rectum ([place]) 2012   Diabetes mellitus without complication ([place])    EVOJJKKX(381.8)    experiences migraine every couple of weeks. 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  colposcopy adequate normal 10/2015    Hyperlipidemia    Mild non proliferative diabetic retinopathy ([place]) 01/01/2021   Left eye    Pulmonary  embolism ([place])    STD (sexually transmitted disease) 1991   POS GC-TREATED   Thyroid disease     Past Surgical History:  Procedure Laterality Date   AORTA - BILATERAL FEMORAL ARTERY BYPASS GRAFT N/A 03/29/2020   Procedure: VENO VENOUS BYPASS;  Surgeon: Waynetta Sandy, MD;  Location: [place];  Service: Vascular;  Laterality: N/A;   APPLICATION OF [place] N/A 03/29/2020   Procedure: [place];  Surgeon: Waynetta Sandy, MD;  Location: [place];  Service: Vascular;  Laterality: N/A;   CESAREAN SECTION  2005   COLON SURGERY     ENDOVASCULAR STENT INSERTION N/A 03/29/2020   Procedure: Inferior Vena Cava WallStent Graft Insertion;  Surgeon: Waynetta Sandy, MD;  Location: [place];  Service: Vascular;  Laterality: N/A;   INSERTION OF VENA CAVA FILTER Right 2012   none     PORT-A-CATH REMOVAL Right 12/22/2012   Procedure: REMOVAL PORT-A-CATH;  Surgeon: Odis Hollingshead, MD;  Location: [place];  Service: General;  Laterality: Right;   PORTACATH PLACEMENT  02/21/11   tip in mid svc-rosenbower   RECTAL TUMOR BY PROCTOTOMY EXCISION     THROMBECTOMY ILIAC ARTERY N/A 03/29/2020   Procedure: THROMBECTOMY ILIAC  ARTERY MECHANICAL THROMBECTOMY;  Surgeon: Waynetta Sandy, MD;  Location: [facility];  Service: Vascular;  Laterality: N/A;    Current Outpatient Medications  Medication Sig Dispense Refill   apixaban (ELIQUIS) 5 MG TABS tablet TAKE 1 TABLET(5 MG) BY MOUTH TWICE DAILY 180 tablet 3   Ascorbic Acid (VITAMIN C ADULT GUMMIES PO) Take 1 tablet by mouth daily.     cholecalciferol (VITAMIN D) 400 UNITS TABS tablet Take 400 Units by mouth daily.      cyanocobalamin 500 MCG tablet Take 500 mcg by mouth daily.     Ferrous Sulfate (IRON) 325 (65 Fe) MG TABS      Insulin Lispro (HUMALOG [place]) Inject 10 Units into the skin 3 (three) times daily with meals.      JARDIANCE 25 MG TABS tablet Take 25 mg by mouth daily.     levothyroxine (SYNTHROID,  LEVOTHROID) 100 MCG tablet Take 100 mcg by mouth daily before breakfast.     Magnesium 500 MG TABS      MOUNJARO 2.5 MG/0.5ML Pen Inject into the skin.     Multiple Vitamin (MULTIVITAMIN) tablet Take 1 tablet by mouth daily.     POTASSIUM PO Take by mouth.     rosuvastatin (CRESTOR) 10 MG tablet Take 10 mg by mouth daily.     Zinc 100 MG TABS Take 1 tablet by mouth daily.     No current facility-administered medications for this visit.     ALLERGIES: Metformin and related and Penicillins  Family History  Problem Relation Age of Onset   Heart disease Father    Lung cancer Mother    Brain cancer Mother    Diabetes Mother    Hypertension Mother    Diabetes Brother    Kidney disease Brother    Cervical cancer Paternal Grandmother    Breast cancer Neg Hx     Social History   Socioeconomic History   Marital status: Married    Spouse name: Not on file   Number of children: 1   Years of education: Not on file   Highest education level: Not on file  Occupational History   Occupation: Engineer, structural  Tobacco Use   Smoking status: Never   Smokeless tobacco: Never  Substance and Sexual Activity   Alcohol use: Yes    Comment: rare- twice a year   Drug use: No   Sexual activity: Not Currently    Comment: no periods due to radiation  Other Topics Concern   Not on file  Social History Narrative   Not on file   Social Determinants of Health   Financial Resource Strain: Not on file  Food Insecurity: Not on file  Transportation Needs: Not on file  Physical Activity: Not on file  Stress: Not on file  Social Connections: Not on file  Intimate Partner Violence: Not on file    ROS  PHYSICAL EXAMINATION:    BP 110/62   Pulse 88   Ht '5\' 11"'$  (1.803 m)   Wt 194 lb (88 kg)   LMP 09/12/2011   SpO2 99%   BMI 27.06 kg/m     General appearance: alert, cooperative and appears stated age  Pelvic: External genitalia:  no lesions              Urethra:  normal appearing  urethra with no masses, tenderness or lesions              Bartholins and Skenes: normal  Vagina: atropic appearing vagina with a grade 2 cystocele (without valsalva)              Cervix:  no gross lesions  Colposcopy: unsatisfactory. No aceto-white changes. Diffuse decrease in lugols uptake over the cervix and the upper lateral vagina. Biopsy taken on the distal cervix at 10 o'clock. Needed to place a tenaculum in order to obtain the ECC. Minimal tissue in the ECC, used the curette and cytobrush x 2. Hemostasis obtained with use of silver nitrate.                 Chaperone was present for exam.  1. Pap smear of cervix with ASCUS, cannot exclude HGSIL Colposcopy unsatisfactory. Decreased lugols uptake extended from the cervix into the upper vagina - Colposcopy - Surgical pathology( [place])  2. Pap smear of cervix shows high risk HPV present - Colposcopy - Surgical pathology( [place]/ [facility])  CC: Marny Lowenstein, NP

## 2022-05-28 NOTE — Patient Instructions (Signed)
.  gwh

## 2022-05-31 LAB — SURGICAL PATHOLOGY

## 2022-06-03 ENCOUNTER — Telehealth: Payer: Self-pay | Admitting: Obstetrics and Gynecology

## 2022-06-03 NOTE — Telephone Encounter (Signed)
Spoke with patient. Reviewed surgery dates. Patient request to proceed with surgery on 07/02/22, declines earlier dates available.  Advised patient I will forward to business office for return call. I will return call once surgery date and time confirmed. Patient verbalizes understanding and is agreeable.   Surgery request sent.   Routing to [employer]

## 2022-06-03 NOTE — Telephone Encounter (Signed)
Surgery:  loop cone cervical biopsy with laser of the distal cervix and upper vagina (Please make sure the laser is attached to the colposcope)  Diagnosis:  high grade dysplasia  Location: [place]  Status: Outpatient  Time: 30 Minutes  Assistant: N/A  Urgency: At Patient's Convenience  Pre-Op Appointment: To Be Scheduled  Post-Op Appointment(s): 4 Weeks  Time Out Of Work: Day Of Surgery, 1 Day Post Op

## 2022-06-06 NOTE — Telephone Encounter (Signed)
Left message to call Nikolos Billig, RN at [facility], [phone].  

## 2022-06-11 ENCOUNTER — Ambulatory Visit (INDEPENDENT_AMBULATORY_CARE_PROVIDER_SITE_OTHER): Payer: No Typology Code available for payment source | Admitting: Nurse Practitioner

## 2022-06-11 ENCOUNTER — Encounter: Payer: Self-pay | Admitting: Nurse Practitioner

## 2022-06-11 VITALS — BP 120/78 | HR 73 | Temp 98.1°F | Ht 71.0 in | Wt 195.4 lb

## 2022-06-11 DIAGNOSIS — E039 Hypothyroidism, unspecified: Secondary | ICD-10-CM | POA: Diagnosis not present

## 2022-06-11 DIAGNOSIS — E782 Mixed hyperlipidemia: Secondary | ICD-10-CM | POA: Diagnosis not present

## 2022-06-11 DIAGNOSIS — E113293 Type 2 diabetes mellitus with mild nonproliferative diabetic retinopathy without macular edema, bilateral: Secondary | ICD-10-CM

## 2022-06-11 NOTE — Patient Instructions (Signed)

## 2022-06-11 NOTE — Progress Notes (Signed)
[name],acting as a Education administrator for [name], FNP.,have documented all relevant documentation on the behalf of [name], FNP,as directed by  [name], FNP while in the presence of [name], [place].    Subjective:     Patient ID: [name] , female    DOB: 1966-03-11 , 56 y.o.   MRN: [id number]   Chief Complaint  Patient presents with   Follow-up    HPI  Patient presents today for a DM follow up and poison ivy follow up, patient states she had poison ivy really bad. Patient states it is much better now, no more itching but it did leave marks. Patient declined shingrix today. Patient states she has been having cramps and pain in her legs at night the pain is a 8/10, she also states her legs lock up it is both legs but mostly her left.   She was to see Dr. Joan Flores to discuss anticoagulation therapy.  She is now on Mounjaro 2.5 mg weekly, she is to f/u with Dr. Chalmers Cater soon  BP Readings from Last 3 Encounters: 06/11/22 : 120/78 05/28/22 : 110/62 05/07/22 : 120/80     Past Medical History:  Diagnosis Date   ASCUS 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   Left eye    Pulmonary embolism ([place])    STD (sexually transmitted disease) 1991   POS GC-TREATED   Thyroid disease      Family History  Problem Relation Age of Onset   Heart disease Father    Lung cancer Mother    Brain cancer Mother    Diabetes Mother    Hypertension Mother    Diabetes Brother    Kidney disease Brother     Cervical cancer Paternal Grandmother    Breast cancer Neg Hx      Current Outpatient Medications:    apixaban (ELIQUIS) 5 MG TABS tablet, TAKE 1 TABLET(5 MG) BY MOUTH TWICE DAILY, Disp: 180 tablet, Rfl: 3   Ascorbic Acid (VITAMIN C ADULT GUMMIES PO), Take 1 tablet by mouth daily., Disp: , Rfl:    cholecalciferol (VITAMIN D) 400 UNITS TABS tablet, Take 400 Units by mouth daily. , Disp: , Rfl:    cyanocobalamin 500 MCG tablet, Take 500 mcg by mouth daily., Disp: , Rfl:    Ferrous Sulfate (IRON) 325 (65 Fe) MG TABS, , Disp: , Rfl:    Insulin Lispro (HUMALOG [place]), Inject 10 Units into the skin 3 (three) times daily with meals. , Disp: , Rfl:    JARDIANCE 25 MG TABS tablet, Take 25 mg by mouth daily., Disp: , Rfl:    levothyroxine (SYNTHROID, LEVOTHROID) 100 MCG tablet, Take 100 mcg by mouth daily before breakfast., Disp: , Rfl:    Magnesium 500 MG TABS, , Disp: , Rfl:    MOUNJARO 2.5 MG/0.5ML Pen, Inject into the skin., Disp: , Rfl:    Multiple Vitamin (MULTIVITAMIN) tablet, Take 1 tablet by mouth daily., Disp: , Rfl:    POTASSIUM PO, Take  by mouth., Disp: , Rfl:    rosuvastatin (CRESTOR) 10 MG tablet, Take 10 mg by mouth daily., Disp: , Rfl:    Zinc 100 MG TABS, Take 1 tablet by mouth daily., Disp: , Rfl:    Allergies  Allergen Reactions   Metformin And Related Other (See Comments)    Chest pain   Penicillins Hives     Review of Systems  Constitutional: Negative.   Eyes: Negative.   Endocrine: Negative for polydipsia, polyphagia and polyuria.  Musculoskeletal: Negative.   Skin: Negative.   Psychiatric/Behavioral: Negative.       Today's Vitals   06/11/22 0927  BP: 120/78  Pulse: 73  Temp: 98.1 F (36.7 C)  TempSrc: Oral  Weight: 195 lb 6.4 oz (88.6 kg)  Height: $Remove'5\' 11"'TVxHboD$  (1.803 m)  PainSc: 0-No pain   Body mass index is 27.25 kg/m.  Wt Readings from Last 3 Encounters:  06/11/22 195 lb 6.4 oz (88.6 kg)  05/28/22 194 lb (88 kg)  05/07/22 197 lb (89.4 kg)     Objective:   Physical Exam Vitals reviewed.  Constitutional:      General: She is not in acute distress.    Appearance: Normal appearance. She is well-developed.  Cardiovascular:     Rate and Rhythm: Normal rate and regular rhythm.     Heart sounds: Normal heart sounds. No murmur heard. Pulmonary:     Effort: Pulmonary effort is normal. No respiratory distress.     Breath sounds: Normal breath sounds.  Chest:     Chest wall: No tenderness.  Musculoskeletal:        General: Normal range of motion.     Cervical back: Normal range of motion and neck supple.  Skin:    General: Skin is warm and dry.     Capillary Refill: Capillary refill takes less than 2 seconds.  Neurological:     General: No focal deficit present.     Mental Status: She is alert and oriented to person, place, and time.     Cranial Nerves: No cranial nerve deficit.  Psychiatric:        Mood and Affect: Mood normal.        Behavior: Behavior normal.        Thought Content: Thought content normal.        Judgment: Judgment normal.         Assessment And Plan:     1. Type 2 diabetes mellitus with both eyes affected by mild nonproliferative retinopathy without macular edema, without long-term current use of insulin (HCC) Comments: Continue follow up with Endocrinology, will check HgbA1c and send copy to Dr. Chalmers Cater - BMP8+eGFR - Hemoglobin A1c  2. Hypothyroidism, unspecified type Comments: Continue follow up with Dr. Chalmers Cater, has been normal.  3. Mixed hyperlipidemia Comments: Cholesterol levels are stable, continue focusing on diet low in fat. Continue statin, tolerating well - Lipid panel     Patient was given opportunity to ask questions. Patient verbalized understanding of the plan and was able to repeat key elements of the plan. All questions were answered to their satisfaction.  [name], FNP   I, [name], FNP, have reviewed all documentation for this visit. The documentation on 06/11/22 for the exam,  diagnosis, procedures, and orders are all accurate and complete.   IF YOU HAVE BEEN REFERRED TO A SPECIALIST, IT MAY TAKE 1-2 WEEKS TO SCHEDULE/PROCESS THE REFERRAL. IF YOU HAVE NOT HEARD FROM US/SPECIALIST IN TWO WEEKS, PLEASE GIVE [place] A CALL  AT [phone].   THE PATIENT IS ENCOURAGED TO PRACTICE SOCIAL DISTANCING DUE TO THE COVID-19 PANDEMIC.

## 2022-06-12 LAB — BMP8+EGFR
BUN/Creatinine Ratio: 17 (ref 9–23)
BUN: 15 mg/dL (ref 6–24)
CO2: 23 mmol/L (ref 20–29)
Calcium: 9.7 mg/dL (ref 8.7–10.2)
Chloride: 103 mmol/L (ref 96–106)
Creatinine, Ser: 0.9 mg/dL (ref 0.57–1.00)
Glucose: 118 mg/dL — ABNORMAL HIGH (ref 70–99)
Potassium: 5.5 mmol/L — ABNORMAL HIGH (ref 3.5–5.2)
Sodium: 141 mmol/L (ref 134–144)
eGFR: 75 mL/min/{1.73_m2} (ref >59)

## 2022-06-12 LAB — LIPID PANEL
Chol/HDL Ratio: 2.7 ratio (ref 0.0–4.4)
Cholesterol, Total: 158 mg/dL (ref 100–199)
HDL: 58 mg/dL (ref >39)
LDL Chol Calc (NIH): 85 mg/dL (ref 0–99)
Triglycerides: 81 mg/dL (ref 0–149)
VLDL Cholesterol Cal: 15 mg/dL (ref 5–40)

## 2022-06-12 LAB — HEMOGLOBIN A1C
Est. average glucose Bld gHb Est-mCnc: 169 mg/dL
Hgb A1c MFr Bld: 7.5 % — ABNORMAL HIGH (ref 4.8–5.6)

## 2022-06-12 NOTE — Telephone Encounter (Signed)
Spoke with patient. Surgery date request confirmed.  Advised surgery is scheduled for 07/02/22, [facility] at 1015.  Surgery instruction sheet and hospital brochure reviewed, printed copy will be mailed.  Patient verbalizes understanding and is agreeable.  Routing to provider. Encounter closed.

## 2022-06-13 NOTE — Telephone Encounter (Signed)
Spoke with patient regarding surgery benefits. Patient acknowledges understanding of information presented. Patient is aware that benefits presented are professional benefits only. Patient is aware the hospital will call with facility benefits. See account note.    

## 2022-06-18 ENCOUNTER — Ambulatory Visit (INDEPENDENT_AMBULATORY_CARE_PROVIDER_SITE_OTHER): Payer: No Typology Code available for payment source | Admitting: Obstetrics and Gynecology

## 2022-06-18 ENCOUNTER — Encounter (HOSPITAL_BASED_OUTPATIENT_CLINIC_OR_DEPARTMENT_OTHER): Payer: Self-pay | Admitting: Obstetrics and Gynecology

## 2022-06-18 ENCOUNTER — Encounter: Payer: Self-pay | Admitting: Obstetrics and Gynecology

## 2022-06-18 VITALS — BP 120/74 | HR 72 | Ht 71.0 in | Wt 196.4 lb

## 2022-06-18 DIAGNOSIS — N879 Dysplasia of cervix uteri, unspecified: Secondary | ICD-10-CM | POA: Diagnosis not present

## 2022-06-18 DIAGNOSIS — Z86711 Personal history of pulmonary embolism: Secondary | ICD-10-CM

## 2022-06-18 DIAGNOSIS — Z8639 Personal history of other endocrine, nutritional and metabolic disease: Secondary | ICD-10-CM

## 2022-06-18 DIAGNOSIS — Z7901 Long term (current) use of anticoagulants: Secondary | ICD-10-CM | POA: Diagnosis not present

## 2022-06-18 NOTE — H&P (View-Only) (Signed)
GYNECOLOGY  VISIT   HPI: 56 y.o.   Married Black or [place] American Not Hispanic or Latino  female   [phone] with Patient's last menstrual period was 09/12/2011.   here for pre op consultation   Pap from 05/07/22 returned with ASC-H, +HPV  05/28/22 Colposcopy: unsatisfactory. No aceto-white changes. Diffuse decrease in lugols uptake over the cervix and the upper lateral vagina. Biopsy taken on the distal cervix at 10 o'clock. Needed to place a tenaculum in order to obtain the ECC. Minimal tissue in the ECC, used the curette and cytobrush x 2.  SURGICAL PATHOLOGY  A. CERVIX, 10 O'CLOCK, BIOPSY:  -  High-grade squamous intraepithelial lesion (HGSIL).  Note: The lesion is consistent with a high-grade squamous  intraepithelial lesion; however given tangential sectioning and  significant sloughing/denudation and a definitive grade of CIN-2 versus  CIN-2 cannot be made.  The immunohistochemical stain p16, however, shows  blocklike positivity confirming the diagnosis of a high-grade squamous  intraepithelial lesion.  B. ENDOCERVICAL, CURETTAGE:  -  Fragments of poorly oriented dysplastic appearing squamous  epithelium/ectocervix, positive for blocklike p16 staining, consistent  with a high-grade squamous intraepithelial lesion (HGSIL).   H/O rectal cancer in 2012.  H/O DM, DVT, PE, hypothyroidism, hyperlipidemia.  Risk factors for pulmonary embolism felt to be related to obesity, oral contraceptive use, rectal cancer, and sedentary job. She developed extensive DVT's in 2021 at the time she was admitted with covid pneumonia.  She has been on Elequis since 2021 Negative antiphospholipid testing and D-dimer in 3/23 Recent HgbA1C was 7.5%  GYNECOLOGIC HISTORY: Patient's last menstrual period was 09/12/2011. 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   Sepsis due to COVID-19 ([place]) 03/22/2020   Type 2 diabetes mellitus without complication, without long-term current use of insulin ([place]) 12/03/2018   Hypothyroidism 07/08/2018   Bilateral calf pain 06/08/2015   Chest pain 08/22/2014   Hyperlipidemia 07/13/2014   Family history of hypertrophic cardiomyopathy 07/13/2014   DOE (dyspnea on exertion) 07/13/2014   Diabetes ([place]) 11/01/2013   Adiposity 11/01/2013   Obesity 11/01/2013   Diabetes mellitus    Malignant neoplasm of rectum ([place]) 08/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   Left eye    Pulmonary embolism ([place]) 2012   STD (sexually transmitted disease) 1991  POS GC-TREATED   Thyroid disease     Past Surgical History:  Procedure Laterality Date   AORTA - BILATERAL FEMORAL ARTERY BYPASS GRAFT N/A 03/29/2020   Procedure: VENO VENOUS BYPASS;  Surgeon: Waynetta Sandy, MD;  Location: [place];  Service: Vascular;  Laterality: N/A;   APPLICATION OF [place] N/A 03/29/2020   Procedure: [place];  Surgeon: Waynetta Sandy, MD;  Location: [place];  Service: Vascular;  Laterality: N/A;   CESAREAN SECTION  2005   COLON SURGERY     ENDOVASCULAR STENT INSERTION N/A 03/29/2020    Procedure: Inferior Vena Cava WallStent Graft Insertion;  Surgeon: Waynetta Sandy, MD;  Location: [place];  Service: Vascular;  Laterality: N/A;   INSERTION OF VENA CAVA FILTER Right 2012   none     PORT-A-CATH REMOVAL Right 12/22/2012   Procedure: REMOVAL PORT-A-CATH;  Surgeon: Odis Hollingshead, MD;  Location: [place];  Service: General;  Laterality: Right;   PORTACATH PLACEMENT  02/21/11   tip in mid svc-rosenbower   RECTAL TUMOR BY PROCTOTOMY EXCISION     THROMBECTOMY ILIAC ARTERY N/A 03/29/2020   Procedure: THROMBECTOMY ILIAC ARTERY MECHANICAL THROMBECTOMY;  Surgeon: Waynetta Sandy, MD;  Location: [place] [facility];  Service: Vascular;  Laterality: N/A;    Current Outpatient Medications  Medication Sig Dispense Refill   apixaban (ELIQUIS) 5 MG TABS tablet TAKE 1 TABLET(5 MG) BY MOUTH TWICE DAILY 180 tablet 3   Ascorbic Acid (VITAMIN C ADULT GUMMIES PO) Take 1 tablet by mouth daily.     cholecalciferol (VITAMIN D) 400 UNITS TABS tablet Take 400 Units by mouth daily.      cyanocobalamin 500 MCG tablet Take 500 mcg by mouth daily.     Ferrous Sulfate (IRON) 325 (65 Fe) MG TABS      Insulin Lispro (HUMALOG [place]) Inject 10 Units into the skin 3 (three) times daily with meals.      JARDIANCE 25 MG TABS tablet Take 25 mg by mouth daily.     levothyroxine (SYNTHROID, LEVOTHROID) 100 MCG tablet Take 100 mcg by mouth daily before breakfast.     Magnesium 500 MG TABS      MOUNJARO 2.5 MG/0.5ML Pen Inject into the skin.     POTASSIUM PO Take by mouth.     rosuvastatin (CRESTOR) 10 MG tablet Take 10 mg by mouth daily.     Zinc 100 MG TABS Take 1 tablet by mouth daily.     Multiple Vitamin (MULTIVITAMIN) tablet Take 1 tablet by mouth daily. (Patient not taking: Reported on 06/18/2022)     No current facility-administered medications for this visit.     ALLERGIES: Metformin and related and Penicillins  Family History  Problem Relation Age of Onset   Heart disease Father     Lung cancer Mother    Brain cancer Mother    Diabetes Mother    Hypertension Mother    Diabetes Brother    Kidney disease Brother    Cervical cancer Paternal Grandmother    Breast cancer Neg Hx     Social History   Socioeconomic History   Marital status: Married    Spouse name: Not on file   Number of children: 1   Years of education: Not on file   Highest education level: Not on file  Occupational History   Occupation: Engineer, structural  Tobacco Use   Smoking status: Never   Smokeless tobacco: Never  Substance and Sexual Activity   Alcohol use:  Yes    Comment: rare- twice a year   Drug use: No   Sexual activity: Not Currently    Comment: no periods due to radiation  Other Topics Concern   Not on file  Social History Narrative   Not on file   Social Determinants of Health   Financial Resource Strain: Not on file  Food Insecurity: Not on file  Transportation Needs: Not on file  Physical Activity: Not on file  Stress: Not on file  Social Connections: Not on file  Intimate Partner Violence: Not on file    Review of Systems  All other systems reviewed and are negative.   PHYSICAL EXAMINATION:    BP 120/74   Pulse 72   Ht '5\' 11"'$  (1.803 m)   Wt 196 lb 6.4 oz (89.1 kg)   LMP 09/12/2011   SpO2 99%   BMI 27.39 kg/m     General appearance: alert, cooperative and appears stated age Neck: no adenopathy, supple, symmetrical, trachea midline and thyroid normal to inspection and palpation Heart: regular rate and rhythm Lungs: CTAB Abdomen: soft, non-tender; bowel sounds normal; no masses,  no organomegaly Extremities: normal, atraumatic, no cyanosis Skin: normal color, texture and turgor, no rashes or lesions Lymph: normal cervical supraclavicular and inguinal nodes Neurologic: grossly normal   1. 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 History of diabetes  mellitus Recent hgba1c was 7.5%  3. History of pulmonary embolism  4. On anticoagulant therapy Instructed to hold her eliquis for 2 days prior to surgery   CC: [name], FNP; Betsy Coder, MD

## 2022-06-18 NOTE — Progress Notes (Unsigned)
GYNECOLOGY  VISIT   HPI: 56 y.o.   Married Black or [place] American Not Hispanic or Latino  female   [phone] with Patient's last menstrual period was 09/12/2011.   here for pre op consultation   Pap from 05/07/22 returned with ASC-H, +HPV  05/28/22 Colposcopy: unsatisfactory. No aceto-white changes. Diffuse decrease in lugols uptake over the cervix and the upper lateral vagina. Biopsy taken on the distal cervix at 10 o'clock. Needed to place a tenaculum in order to obtain the ECC. Minimal tissue in the ECC, used the curette and cytobrush x 2.  SURGICAL PATHOLOGY  A. CERVIX, 10 O'CLOCK, BIOPSY:  -  High-grade squamous intraepithelial lesion (HGSIL).  Note: The lesion is consistent with a high-grade squamous  intraepithelial lesion; however given tangential sectioning and  significant sloughing/denudation and a definitive grade of CIN-2 versus  CIN-2 cannot be made.  The immunohistochemical stain p16, however, shows  blocklike positivity confirming the diagnosis of a high-grade squamous  intraepithelial lesion.  B. ENDOCERVICAL, CURETTAGE:  -  Fragments of poorly oriented dysplastic appearing squamous  epithelium/ectocervix, positive for blocklike p16 staining, consistent  with a high-grade squamous intraepithelial lesion (HGSIL).   H/O rectal cancer in 2012.  H/O DM, DVT, PE, hypothyroidism, hyperlipidemia.  Risk factors for pulmonary embolism felt to be related to obesity, oral contraceptive use, rectal cancer, and sedentary job. She developed extensive DVT's in 2021 at the time she was admitted with covid pneumonia.  She has been on Elequis since 2021 Negative antiphospholipid testing and D-dimer in 3/23 Recent HgbA1C was 7.5%  GYNECOLOGIC HISTORY: Patient's last menstrual period was 09/12/2011. 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   Sepsis due to COVID-19 ([place]) 03/22/2020   Type 2 diabetes mellitus without complication, without long-term current use of insulin ([place]) 12/03/2018   Hypothyroidism 07/08/2018   Bilateral calf pain 06/08/2015   Chest pain 08/22/2014   Hyperlipidemia 07/13/2014   Family history of hypertrophic cardiomyopathy 07/13/2014   DOE (dyspnea on exertion) 07/13/2014   Diabetes ([place]) 11/01/2013   Adiposity 11/01/2013   Obesity 11/01/2013   Diabetes mellitus    Malignant neoplasm of rectum ([place]) 08/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   Left eye    Pulmonary embolism ([place]) 2012   STD (sexually transmitted disease) 1991  POS GC-TREATED   Thyroid disease     Past Surgical History:  Procedure Laterality Date   AORTA - BILATERAL FEMORAL ARTERY BYPASS GRAFT N/A 03/29/2020   Procedure: VENO VENOUS BYPASS;  Surgeon: Waynetta Sandy, MD;  Location: [facility];  Service: Vascular;  Laterality: N/A;   APPLICATION OF [place] N/A 03/29/2020   Procedure: [place];  Surgeon: Waynetta Sandy, MD;  Location: [facility];  Service: Vascular;  Laterality: N/A;   CESAREAN SECTION  2005   COLON SURGERY     ENDOVASCULAR STENT INSERTION N/A 03/29/2020    Procedure: Inferior Vena Cava WallStent Graft Insertion;  Surgeon: Waynetta Sandy, MD;  Location: [place];  Service: Vascular;  Laterality: N/A;   INSERTION OF VENA CAVA FILTER Right 2012   none     PORT-A-CATH REMOVAL Right 12/22/2012   Procedure: REMOVAL PORT-A-CATH;  Surgeon: Odis Hollingshead, MD;  Location: [place];  Service: General;  Laterality: Right;   PORTACATH PLACEMENT  02/21/11   tip in mid svc-rosenbower   RECTAL TUMOR BY PROCTOTOMY EXCISION     THROMBECTOMY ILIAC ARTERY N/A 03/29/2020   Procedure: THROMBECTOMY ILIAC ARTERY MECHANICAL THROMBECTOMY;  Surgeon: Waynetta Sandy, MD;  Location: [facility];  Service: Vascular;  Laterality: N/A;    Current Outpatient Medications  Medication Sig Dispense Refill   apixaban (ELIQUIS) 5 MG TABS tablet TAKE 1 TABLET(5 MG) BY MOUTH TWICE DAILY 180 tablet 3   Ascorbic Acid (VITAMIN C ADULT GUMMIES PO) Take 1 tablet by mouth daily.     cholecalciferol (VITAMIN D) 400 UNITS TABS tablet Take 400 Units by mouth daily.      cyanocobalamin 500 MCG tablet Take 500 mcg by mouth daily.     Ferrous Sulfate (IRON) 325 (65 Fe) MG TABS      Insulin Lispro (HUMALOG [place]) Inject 10 Units into the skin 3 (three) times daily with meals.      JARDIANCE 25 MG TABS tablet Take 25 mg by mouth daily.     levothyroxine (SYNTHROID, LEVOTHROID) 100 MCG tablet Take 100 mcg by mouth daily before breakfast.     Magnesium 500 MG TABS      MOUNJARO 2.5 MG/0.5ML Pen Inject into the skin.     POTASSIUM PO Take by mouth.     rosuvastatin (CRESTOR) 10 MG tablet Take 10 mg by mouth daily.     Zinc 100 MG TABS Take 1 tablet by mouth daily.     Multiple Vitamin (MULTIVITAMIN) tablet Take 1 tablet by mouth daily. (Patient not taking: Reported on 06/18/2022)     No current facility-administered medications for this visit.     ALLERGIES: Metformin and related and Penicillins  Family History  Problem Relation Age of Onset   Heart disease Father     Lung cancer Mother    Brain cancer Mother    Diabetes Mother    Hypertension Mother    Diabetes Brother    Kidney disease Brother    Cervical cancer Paternal Grandmother    Breast cancer Neg Hx     Social History   Socioeconomic History   Marital status: Married    Spouse name: Not on file   Number of children: 1   Years of education: Not on file   Highest education level: Not on file  Occupational History   Occupation: Engineer, structural  Tobacco Use   Smoking status: Never   Smokeless tobacco: Never  Substance and Sexual Activity   Alcohol use:  Yes    Comment: rare- twice a year   Drug use: No   Sexual activity: Not Currently    Comment: no periods due to radiation  Other Topics Concern   Not on file  Social History Narrative   Not on file   Social Determinants of Health   Financial Resource Strain: Not on file  Food Insecurity: Not on file  Transportation Needs: Not on file  Physical Activity: Not on file  Stress: Not on file  Social Connections: Not on file  Intimate Partner Violence: Not on file    Review of Systems  All other systems reviewed and are negative.   PHYSICAL EXAMINATION:    BP 120/74   Pulse 72   Ht '5\' 11"'$  (1.803 m)   Wt 196 lb 6.4 oz (89.1 kg)   LMP 09/12/2011   SpO2 99%   BMI 27.39 kg/m     General appearance: alert, cooperative and appears stated age Neck: no adenopathy, supple, symmetrical, trachea midline and thyroid normal to inspection and palpation Heart: regular rate and rhythm Lungs: CTAB Abdomen: soft, non-tender; bowel sounds normal; no masses,  no organomegaly Extremities: normal, atraumatic, no cyanosis Skin: normal color, texture and turgor, no rashes or lesions Lymph: normal cervical supraclavicular and inguinal nodes Neurologic: grossly normal   1. 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 History of diabetes  mellitus Recent hgba1c was 7.5%  3. History of pulmonary embolism  4. On anticoagulant therapy Instructed to hold her eliquis for 2 days prior to surgery   CC: [name], FNP; Betsy Coder, MD

## 2022-06-19 ENCOUNTER — Encounter: Payer: Self-pay | Admitting: Obstetrics and Gynecology

## 2022-06-19 ENCOUNTER — Encounter (HOSPITAL_BASED_OUTPATIENT_CLINIC_OR_DEPARTMENT_OTHER): Payer: Self-pay | Admitting: Obstetrics and Gynecology

## 2022-06-20 ENCOUNTER — Encounter (HOSPITAL_BASED_OUTPATIENT_CLINIC_OR_DEPARTMENT_OTHER): Payer: Self-pay | Admitting: Obstetrics and Gynecology

## 2022-06-26 ENCOUNTER — Other Ambulatory Visit: Payer: Self-pay

## 2022-06-26 ENCOUNTER — Encounter (HOSPITAL_BASED_OUTPATIENT_CLINIC_OR_DEPARTMENT_OTHER): Payer: Self-pay | Admitting: Obstetrics and Gynecology

## 2022-06-26 NOTE — Progress Notes (Addendum)
Spoke w/ via phone for pre-op interview---pt Lab needs dos---- none per anesthesia              Lab results------bmp8   06-11-2022 epic COVID test -----patient states asymptomatic no test needed Arrive at -------830 am 07-02-2022 NPO after MN NO Solid Food.  Clear liquids from MN until---730 am Med rec completed Medications to take morning of surgery -----levothyroxine, rosuvastatin Diabetic medication -----hold mounjaro Monday 07-01-2022 per anesthesia guidelines Patient instructed no nail polish to be worn day of surgery Patient instructed to bring photo id and insurance card day of surgery Patient aware to have Driver (ride ) / caregiver   husband reggie  for 24 hours after surgery  Patient Special Instructions -----stop eliquis x 2 days before surgery per dr Talbert Nan instructions  (patient aware) Pre-Op special Istructions -----none Patient verbalized understanding of instructions that were given at this phone interview. Patient denies  cardiac s & s, shortness of breath, chest pain, fever, cough at this phone interview.   Addendum: reviewed patient medical history with dr g rose mda, pt ok for 07-02-2022 surgery at [facility] per dr Viviano Simas mda  Echo 11-09-2021 Lov dr Gwyndolyn Kaufman cardiology) 04-03-2022 epic  Lov dr sherrill oncology 02-07-2022 epic Lov dr Joan Flores hematology 01-15-2021 epic [facility] vascular 05-19-2020 dr b cain epic   Pt has free style libre on right arm

## 2022-07-02 ENCOUNTER — Ambulatory Visit (HOSPITAL_BASED_OUTPATIENT_CLINIC_OR_DEPARTMENT_OTHER)
Admission: RE | Admit: 2022-07-02 | Discharge: 2022-07-02 | Disposition: A | Discharge status: Home / Self Care | Payer: No Typology Code available for payment source | Attending: Obstetrics and Gynecology | Admitting: Obstetrics and Gynecology

## 2022-07-02 ENCOUNTER — Ambulatory Visit (HOSPITAL_BASED_OUTPATIENT_CLINIC_OR_DEPARTMENT_OTHER): Payer: No Typology Code available for payment source | Admitting: Anesthesiology

## 2022-07-02 ENCOUNTER — Other Ambulatory Visit: Payer: Self-pay

## 2022-07-02 ENCOUNTER — Encounter (HOSPITAL_BASED_OUTPATIENT_CLINIC_OR_DEPARTMENT_OTHER)
Admission: RE | Disposition: A | Discharge status: Home / Self Care | Payer: Self-pay | Source: Home / Self Care | Attending: Obstetrics and Gynecology

## 2022-07-02 ENCOUNTER — Encounter (HOSPITAL_BASED_OUTPATIENT_CLINIC_OR_DEPARTMENT_OTHER): Payer: Self-pay | Admitting: Obstetrics and Gynecology

## 2022-07-02 DIAGNOSIS — D072 Carcinoma in situ of vagina: Secondary | ICD-10-CM | POA: Diagnosis not present

## 2022-07-02 DIAGNOSIS — Z8616 Personal history of COVID-19: Secondary | ICD-10-CM | POA: Diagnosis not present

## 2022-07-02 DIAGNOSIS — D069 Carcinoma in situ of cervix, unspecified: Secondary | ICD-10-CM

## 2022-07-02 DIAGNOSIS — Z7901 Long term (current) use of anticoagulants: Secondary | ICD-10-CM | POA: Diagnosis not present

## 2022-07-02 DIAGNOSIS — D061 Carcinoma in situ of exocervix: Secondary | ICD-10-CM | POA: Diagnosis present

## 2022-07-02 DIAGNOSIS — Z86711 Personal history of pulmonary embolism: Secondary | ICD-10-CM | POA: Diagnosis not present

## 2022-07-02 DIAGNOSIS — Z86718 Personal history of other venous thrombosis and embolism: Secondary | ICD-10-CM | POA: Insufficient documentation

## 2022-07-02 DIAGNOSIS — N72 Inflammatory disease of cervix uteri: Secondary | ICD-10-CM | POA: Diagnosis not present

## 2022-07-02 DIAGNOSIS — E785 Hyperlipidemia, unspecified: Secondary | ICD-10-CM | POA: Diagnosis not present

## 2022-07-02 DIAGNOSIS — Z85048 Personal history of other malignant neoplasm of rectum, rectosigmoid junction, and anus: Secondary | ICD-10-CM | POA: Insufficient documentation

## 2022-07-02 DIAGNOSIS — Z01818 Encounter for other preprocedural examination: Secondary | ICD-10-CM

## 2022-07-02 DIAGNOSIS — E039 Hypothyroidism, unspecified: Secondary | ICD-10-CM | POA: Insufficient documentation

## 2022-07-02 HISTORY — PX: LEEP: SHX91

## 2022-07-02 HISTORY — DX: Personal history of irradiation: Z92.3

## 2022-07-02 HISTORY — DX: Personal history of other specified conditions: Z87.898

## 2022-07-02 HISTORY — PX: CO2 LASER APPLICATION: SHX5778

## 2022-07-02 LAB — GLUCOSE, CAPILLARY
Glucose-Capillary: 126 mg/dL — ABNORMAL HIGH (ref 70–99)
Glucose-Capillary: 88 mg/dL (ref 70–99)

## 2022-07-02 SURGERY — LEEP (LOOP ELECTROSURGICAL EXCISION PROCEDURE)
Anesthesia: General | Site: Cervix

## 2022-07-02 MED ORDER — KETOROLAC TROMETHAMINE 30 MG/ML IJ SOLN
INTRAMUSCULAR | Status: DC | PRN
  Administered 2022-07-02: 30 mg via INTRAVENOUS

## 2022-07-02 MED ORDER — FERRIC SUBSULFATE 259 MG/GM EX SOLN
CUTANEOUS | Status: AC
  Filled 2022-07-02: qty 8

## 2022-07-02 MED ORDER — PROMETHAZINE HCL 25 MG/ML IJ SOLN: 6.2500 mg | INTRAMUSCULAR | Status: DC | PRN

## 2022-07-02 MED ORDER — FENTANYL CITRATE (PF) 100 MCG/2ML IJ SOLN
INTRAMUSCULAR | Status: AC
  Filled 2022-07-02: qty 2

## 2022-07-02 MED ORDER — SCOPOLAMINE 1 MG/3DAYS TD PT72
1.0000 | MEDICATED_PATCH | TRANSDERMAL | Status: DC
  Administered 2022-07-02: 1.5 mg via TRANSDERMAL

## 2022-07-02 MED ORDER — LIDOCAINE 2% (20 MG/ML) 5 ML SYRINGE
INTRAMUSCULAR | Status: DC | PRN
  Administered 2022-07-02: 100 mg via INTRAVENOUS

## 2022-07-02 MED ORDER — LIDOCAINE-EPINEPHRINE (PF) 1 %-1:200000 IJ SOLN
INTRAMUSCULAR | Status: DC | PRN
  Administered 2022-07-02: 7 mL

## 2022-07-02 MED ORDER — FENTANYL CITRATE (PF) 100 MCG/2ML IJ SOLN: 25.0000 ug | INTRAMUSCULAR | Status: DC | PRN

## 2022-07-02 MED ORDER — PROPOFOL 10 MG/ML IV BOLUS
INTRAVENOUS | Status: DC | PRN
  Administered 2022-07-02: 180 mg via INTRAVENOUS

## 2022-07-02 MED ORDER — PHENYLEPHRINE 80 MCG/ML (10ML) SYRINGE FOR IV PUSH (FOR BLOOD PRESSURE SUPPORT)
PREFILLED_SYRINGE | INTRAVENOUS | Status: DC | PRN
  Administered 2022-07-02: 80 ug via INTRAVENOUS

## 2022-07-02 MED ORDER — ACETIC ACID 5 % SOLN
Status: AC
  Filled 2022-07-02: qty 50

## 2022-07-02 MED ORDER — ACETAMINOPHEN 500 MG PO TABS
1000.0000 mg | ORAL_TABLET | Freq: Once | ORAL | Status: AC
  Administered 2022-07-02: 1000 mg via ORAL

## 2022-07-02 MED ORDER — MIDAZOLAM HCL 2 MG/2ML IJ SOLN
INTRAMUSCULAR | Status: DC | PRN
  Administered 2022-07-02: 2 mg via INTRAVENOUS

## 2022-07-02 MED ORDER — LIDOCAINE-EPINEPHRINE (PF) 1 %-1:200000 IJ SOLN
INTRAMUSCULAR | Status: AC
  Filled 2022-07-02: qty 30

## 2022-07-02 MED ORDER — ONDANSETRON HCL 4 MG/2ML IJ SOLN
INTRAMUSCULAR | Status: DC | PRN
  Administered 2022-07-02: 4 mg via INTRAVENOUS

## 2022-07-02 MED ORDER — IODINE STRONG (LUGOLS) 5 % PO SOLN
ORAL | Status: DC | PRN
  Administered 2022-07-02: 0.2 mL

## 2022-07-02 MED ORDER — ONDANSETRON HCL 4 MG/2ML IJ SOLN
INTRAMUSCULAR | Status: AC
  Filled 2022-07-02: qty 2

## 2022-07-02 MED ORDER — IODINE STRONG (LUGOLS) 5 % PO SOLN
ORAL | Status: AC
  Filled 2022-07-02: qty 1

## 2022-07-02 MED ORDER — DEXAMETHASONE SODIUM PHOSPHATE 10 MG/ML IJ SOLN
INTRAMUSCULAR | Status: AC
  Filled 2022-07-02: qty 1

## 2022-07-02 MED ORDER — ACETAMINOPHEN 500 MG PO TABS
ORAL_TABLET | ORAL | Status: AC
  Filled 2022-07-02: qty 2

## 2022-07-02 MED ORDER — LIDOCAINE HCL (PF) 2 % IJ SOLN
INTRAMUSCULAR | Status: AC
  Filled 2022-07-02: qty 5

## 2022-07-02 MED ORDER — DEXAMETHASONE SODIUM PHOSPHATE 10 MG/ML IJ SOLN
INTRAMUSCULAR | Status: DC | PRN
  Administered 2022-07-02: 5 mg via INTRAVENOUS

## 2022-07-02 MED ORDER — SCOPOLAMINE 1 MG/3DAYS TD PT72
MEDICATED_PATCH | TRANSDERMAL | Status: AC
  Filled 2022-07-02: qty 1

## 2022-07-02 MED ORDER — EPHEDRINE SULFATE-NACL 50-0.9 MG/10ML-% IV SOSY
PREFILLED_SYRINGE | INTRAVENOUS | Status: DC | PRN
  Administered 2022-07-02: 10 mg via INTRAVENOUS

## 2022-07-02 MED ORDER — SILVER NITRATE-POT NITRATE 75-25 % EX MISC
CUTANEOUS | Status: AC
  Filled 2022-07-02: qty 10

## 2022-07-02 MED ORDER — FERRIC SUBSULFATE (BULK) SOLN
Status: DC | PRN
  Administered 2022-07-02: 1

## 2022-07-02 MED ORDER — PROPOFOL 10 MG/ML IV BOLUS
INTRAVENOUS | Status: AC
  Filled 2022-07-02: qty 20

## 2022-07-02 MED ORDER — ACETAMINOPHEN 500 MG PO TABS: 1000.0000 mg | ORAL_TABLET | ORAL | Status: DC

## 2022-07-02 MED ORDER — FENTANYL CITRATE (PF) 100 MCG/2ML IJ SOLN
INTRAMUSCULAR | Status: DC | PRN
  Administered 2022-07-02: 50 ug via INTRAVENOUS

## 2022-07-02 MED ORDER — AMISULPRIDE (ANTIEMETIC) 5 MG/2ML IV SOLN: 10.0000 mg | Freq: Once | INTRAVENOUS | Status: DC | PRN

## 2022-07-02 MED ORDER — MIDAZOLAM HCL 2 MG/2ML IJ SOLN
INTRAMUSCULAR | Status: AC
  Filled 2022-07-02: qty 2

## 2022-07-02 MED ORDER — LACTATED RINGERS IV SOLN
INTRAVENOUS | Status: DC
Start: 2022-07-02 — End: 2022-07-02

## 2022-07-02 MED ORDER — LACTATED RINGERS IV SOLN: INTRAVENOUS | Status: DC

## 2022-07-02 SURGICAL SUPPLY — 38 items
APL SWBSTK 6 STRL LF DISP (MISCELLANEOUS)
APPLICATOR COTTON TIP 6 STRL (MISCELLANEOUS) ×1 IMPLANT
APPLICATOR COTTON TIP 6IN STRL (MISCELLANEOUS)
CNTNR URN SCR LID CUP LEK RST (MISCELLANEOUS) IMPLANT
CONT SPEC 4OZ STRL OR WHT (MISCELLANEOUS) ×5
ELECT BALL LEEP 3MM BLK (ELECTRODE) IMPLANT
ELECT BALL LEEP 5MM RED (ELECTRODE) ×1 IMPLANT
ELECT LEEP 2.0X0.8 R2008 (MISCELLANEOUS) ×1
ELECT LLETZ BALL 5MM DISP (ELECTRODE) IMPLANT
ELECT LOOP LEEP RND 10X10 YLW (CUTTING LOOP)
ELECT LOOP LEEP RND 15X12 GRN (CUTTING LOOP)
ELECT LOOP LEEP RND 20X12 WHT (CUTTING LOOP)
ELECT LOOP LEEP SQR 10X10 ORG (CUTTING LOOP)
ELECT LOOP LLETZ 10X10 DISP (CUTTING LOOP) ×1
ELECT REM PT RETURN 9FT ADLT (ELECTROSURGICAL) ×1
ELECTRODE LEEP 2.0X0.8 R2008 (MISCELLANEOUS) ×1 IMPLANT
ELECTRODE LOOP LP RND 10X10YLW (CUTTING LOOP) ×1 IMPLANT
ELECTRODE LOOP LP RND 15X12GRN (CUTTING LOOP) IMPLANT
ELECTRODE LOOP LP RND 20X12WHT (CUTTING LOOP) IMPLANT
ELECTRODE LOOP LP SQR 10X10ORG (CUTTING LOOP) IMPLANT
ELECTRODE LOOP LTZ 10X10 DISP (CUTTING LOOP) IMPLANT
ELECTRODE REM PT RTRN 9FT ADLT (ELECTROSURGICAL) ×1 IMPLANT
EXTENDER ELECT LOOP LEEP 10CM (CUTTING LOOP) IMPLANT
GAUZE 4X4 16PLY ~~LOC~~+RFID DBL (SPONGE) ×1 IMPLANT
GLOVE BIOGEL PI IND STRL 7.0 (GLOVE) ×2 IMPLANT
GLOVE ECLIPSE 6.5 STRL STRAW (GLOVE) ×1 IMPLANT
GOWN STRL REUS W/TWL LRG LVL3 (GOWN DISPOSABLE) ×2 IMPLANT
KIT TURNOVER CYSTO (KITS) ×1 IMPLANT
NS IRRIG 1000ML POUR BTL (IV SOLUTION) ×1 IMPLANT
PACK VAGINAL WOMENS (CUSTOM PROCEDURE TRAY) ×1 IMPLANT
PAD OB MATERNITY 4.3X12.25 (PERSONAL CARE ITEMS) ×1 IMPLANT
PENCIL SMOKE EVACUATOR (MISCELLANEOUS) ×1 IMPLANT
SCOPETTES 8  STERILE (MISCELLANEOUS) ×1
SCOPETTES 8 STERILE (MISCELLANEOUS) ×2 IMPLANT
SUT SILK 2 0 SH (SUTURE) ×1 IMPLANT
TOWEL OR 17X26 10 PK STRL BLUE (TOWEL DISPOSABLE) ×2 IMPLANT
TUBE CONNECTING 12X1/4 (SUCTIONS) ×1 IMPLANT
VACUUM HOSE 7/8X10 W/ WAND (MISCELLANEOUS) IMPLANT

## 2022-07-02 NOTE — Anesthesia Procedure Notes (Addendum)
Procedure Name: LMA Insertion Date/Time: 07/02/2022 10:37 AM  Performed by: Mechele Claude, CRNAPre-anesthesia Checklist: Patient identified, Emergency Drugs available, Suction available and Patient being monitored Patient Re-evaluated:Patient Re-evaluated prior to induction Oxygen Delivery Method: Circle system utilized Preoxygenation: Pre-oxygenation with 100% oxygen Induction Type: IV induction Ventilation: Mask ventilation without difficulty LMA: LMA inserted LMA Size: 4.0 Number of attempts: 1 Airway Equipment and Method: Bite block Placement Confirmation: positive ETCO2 Tube secured with: Tape Dental Injury: Teeth and Oropharynx as per pre-operative assessment

## 2022-07-02 NOTE — Discharge Instructions (Addendum)
   No acetaminophen/Tylenol until after 2:40 pm today if needed.   No ibuprofen, Advil, Aleve, Motrin, ketorolac, meloxicam, naproxen, or other NSAIDS until after 5:15 pm today if needed.       Post Anesthesia Home Care Instructions  Activity: Get plenty of rest for the remainder of the day. A responsible individual must stay with you for 24 hours following the procedure.  For the next 24 hours, DO NOT: -Drive a car -Paediatric nurse -Drink alcoholic beverages -Take any medication unless instructed by your physician -Make any legal decisions or sign important papers.  Meals: Start with liquid foods such as gelatin or soup. Progress to regular foods as tolerated. Avoid greasy, spicy, heavy foods. If nausea and/or vomiting occur, drink only clear liquids until the nausea and/or vomiting subsides. Call your physician if vomiting continues.  Special Instructions/Symptoms: Your throat may feel dry or sore from the anesthesia or the breathing tube placed in your throat during surgery. If this causes discomfort, gargle with warm salt water. The discomfort should disappear within 24 hours.  If you had a scopolamine patch placed behind your ear for the management of post- operative nausea and/or vomiting:  1. The medication in the patch is effective for 72 hours, after which it should be removed.  Wrap patch in a tissue and discard in the trash. Wash hands thoroughly with soap and water. 2. You may remove the patch earlier than 72 hours if you experience unpleasant side effects which may include dry mouth, dizziness or visual disturbances. 3. Avoid touching the patch. Wash your hands with soap and water after contact with the patch.

## 2022-07-02 NOTE — Anesthesia Postprocedure Evaluation (Signed)
Anesthesia Post Note  Patient: [name]  Procedure(s) Performed: LOOP ELECTROSURGICAL EXCISION PROCEDURE (LEEP) and colposcopy (Cervix) CO2 LASER APPLICATION of distal cervix and upper vagina (Cervix)     Patient location during evaluation: PACU Anesthesia Type: General Level of consciousness: sedated Pain management: pain level controlled Vital Signs Assessment: post-procedure vital signs reviewed and stable Respiratory status: spontaneous breathing and respiratory function stable Cardiovascular status: stable Postop Assessment: no apparent nausea or vomiting Anesthetic complications: no   No notable events documented.  Last Vitals:  Vitals:   07/02/22 1145 07/02/22 1200  BP: 120/77 125/87  Pulse: 66 73  Resp: 12 18  Temp:    SpO2: 100% 100%    Last Pain:  Vitals:   07/02/22 1200  TempSrc:   PainSc: 0-No pain                 Jazae Gandolfi DANIEL

## 2022-07-02 NOTE — Transfer of Care (Signed)
Immediate Anesthesia Transfer of Care Note  Patient: [name]  Procedure(s) Performed: Procedure(s) (LRB): LOOP ELECTROSURGICAL EXCISION PROCEDURE (LEEP) and colposcopy (N/A) CO2 LASER APPLICATION of distal cervix and upper vagina (N/A)  Patient Location: PACU  Anesthesia Type: General  Level of Consciousness: awake, alert  and oriented  Airway & Oxygen Therapy: Patient Spontanous Breathing   Post-op Assessment: Report given to PACU RN and Post -op Vital signs reviewed and stable  Post vital signs: Reviewed and stable  Complications: No apparent anesthesia complications  Last Vitals:  Vitals Value Taken Time  BP 122/78 07/02/22 1122  Temp    Pulse 72 07/02/22 1126  Resp 11 07/02/22 1126  SpO2 98 % 07/02/22 1126  Vitals shown include unvalidated device data.  Last Pain:  Vitals:   07/02/22 0851  TempSrc: Oral  PainSc: 0-No pain      Patients Stated Pain Goal: 4 (66/06/30 1601)  Complications: No notable events documented.

## 2022-07-02 NOTE — Op Note (Signed)
Preoperative Diagnosis: high grade cervical dysplasia  Postoperative Diagnosis: same  Procedure: loop cone cervical biopsy and laser of cervical and vaginal dysplasia with colposcopic guidance.   Surgeon: Dr Sumner Boast  Assistants: None  Anesthesia: General via LMA  EBL: minimal   Fluids: 500 cc   Urine output: not recorded  Indications for surgery: The patient is a 56 yo female, who presented with an ASC-H pap with +HPV. Colposcopy was unsatisfactory, no aceto-white changes, diffuse decrease in lugols uptake over the cervix and upper lateral vagina. Biopsy of the distal cervix and ECC returned with high grade dysplasia The risks of the surgery were reviewed with the patient and the consent form was signed prior to her surgery.  Findings: Colposcopy was unsatisfactory. Decreased lugols uptake of the cervix and the upper vagina  Specimens: Loop cone exocervix, loop cone endocervix, endocervical curettage, right vaginal biopsy, left vaginal biopsy   Procedure: The patient was taken to the operating room with an IV in place. She was placed in the dorsal lithotomy position and anesthesia was administered. She was prepped and draped in the usual sterile fashion for a vaginal procedure. She voided on the way to the OR. A coated speculum was placed in the vagina and lugols solution was placed over the cervix and upper vagina. A paracervical block was injected using 1% lidocaine with epinephrine. Under colposcopic guidance, the 2.0 x 0.8 loop was used to remove a portion of the ectocervix taking care to get the entire transformation zone.  A second 1 x 1 cm loop was used to remove a portion of the endocervix. The settings were 55 cut, 98 coag with a blend of 1.  An ECC was performed. The cautery ball was then used to cauterize the base of the biopsy site and monsels were placed. The lateral cervix and upper vagina were then lasered with the CO2 laser. Hemostasis was excellent. Monsels was  placed over the surgical site. Some of the decreased lugols area in the posterior and lateral vagina was suspected to be from atrophy. Given the wide extent of this regions, biopsies were taken on the right and left vaginal side walls, laser was not performed in this area. Monsels was placed over the the cervix and upper vagina. The patient tolerated the procedure well.  The speculum was removed. The patients perineum was cleansed of betadine and she was taken out of the dorsal lithotomy position.  Upon awakening the LMA was removed and the patient was transferred to the recovery room in stable and awake condition.  The sponge and instrument count were correct. There were no complications.

## 2022-07-02 NOTE — Anesthesia Preprocedure Evaluation (Signed)
Anesthesia Evaluation  Patient identified by MRN, date of birth, ID band  Reviewed: Allergy & Precautions, Patient's Chart, lab work & pertinent test results  Airway Mallampati: II  TM Distance: >3 FB Neck ROM: Full    Dental  (+) Teeth Intact, Dental Advisory Given   Pulmonary PE    + decreased breath sounds      Cardiovascular + DVT   Rhythm:Regular Rate:Normal  10/2021 IMPRESSIONS    1. Left ventricular ejection fraction, by estimation, is 60 to 65%. The  left ventricle has normal function. The left ventricle has no regional  wall motion abnormalities. There is mild asymmetric left ventricular  hypertrophy of the basal and septal  segments. Left ventricular diastolic parameters were normal.  2. Right ventricular systolic function is normal. The right ventricular  size is normal.  3. The mitral valve is abnormal. Trivial mitral valve regurgitation. No  evidence of mitral stenosis.  4. The aortic valve is tricuspid. There is mild calcification of the  aortic valve. Aortic valve regurgitation is not visualized. Aortic valve  sclerosis is present, with no evidence of aortic valve stenosis.  5. The inferior vena cava is normal in size with greater than 50%  respiratory variability, suggesting right atrial pressure of 3 mmHg.    Neuro/Psych  Headaches, negative psych ROS   GI/Hepatic negative GI ROS, Neg liver ROS,   Endo/Other  diabetesHypothyroidism   Renal/GU negative Renal ROS     Musculoskeletal negative musculoskeletal ROS (+)   Abdominal Normal abdominal exam  (+)   Peds  Hematology negative hematology ROS (+)   Anesthesia Other Findings   Reproductive/Obstetrics                             Anesthesia Physical  Anesthesia Plan  ASA: 2  Anesthesia Plan: General   Post-op Pain Management: Tylenol PO (pre-op)* and Toradol IV (intra-op)*   Induction: Intravenous  PONV  Risk Score and Plan: 4 or greater and Ondansetron, Treatment may vary due to age or medical condition, Midazolam, Scopolamine patch - Pre-op and Dexamethasone  Airway Management Planned: LMA  Additional Equipment: None  Intra-op Plan:   Post-operative Plan: Extubation in OR  Informed Consent:   Plan Discussed with: Anesthesiologist  Anesthesia Plan Comments:         Anesthesia Quick Evaluation

## 2022-07-02 NOTE — Interval H&P Note (Signed)
History and Physical Interval Note:  07/02/2022 9:50 AM  [name]  has presented today for surgery, with the diagnosis of high grade dysplasia.  The various methods of treatment have been discussed with the patient and family. After consideration of risks, benefits and other options for treatment, the patient has consented to  Procedure(s): LOOP ELECTROSURGICAL EXCISION PROCEDURE (LEEP) and colposcopy (N/A) CO2 LASER APPLICATION of distal cervix and upper vagina (N/A) as a surgical intervention.  The patient's history has been reviewed, patient examined, no change in status, stable for surgery.  I have reviewed the patient's chart and labs.  Questions were answered to the patient's satisfaction.     Salvadore Dom

## 2022-07-03 ENCOUNTER — Encounter (HOSPITAL_BASED_OUTPATIENT_CLINIC_OR_DEPARTMENT_OTHER): Payer: Self-pay | Admitting: Obstetrics and Gynecology

## 2022-07-04 LAB — SURGICAL PATHOLOGY

## 2022-07-07 IMAGING — CR DG CHEST 2V
2 series · 2 of 2 positions shown · non-contrast
Comparison: 03/21/2020

CLINICAL DATA: History of 40PT6-S8 infection

EXAM:
CHEST - 2 VIEW

[w chest pa]
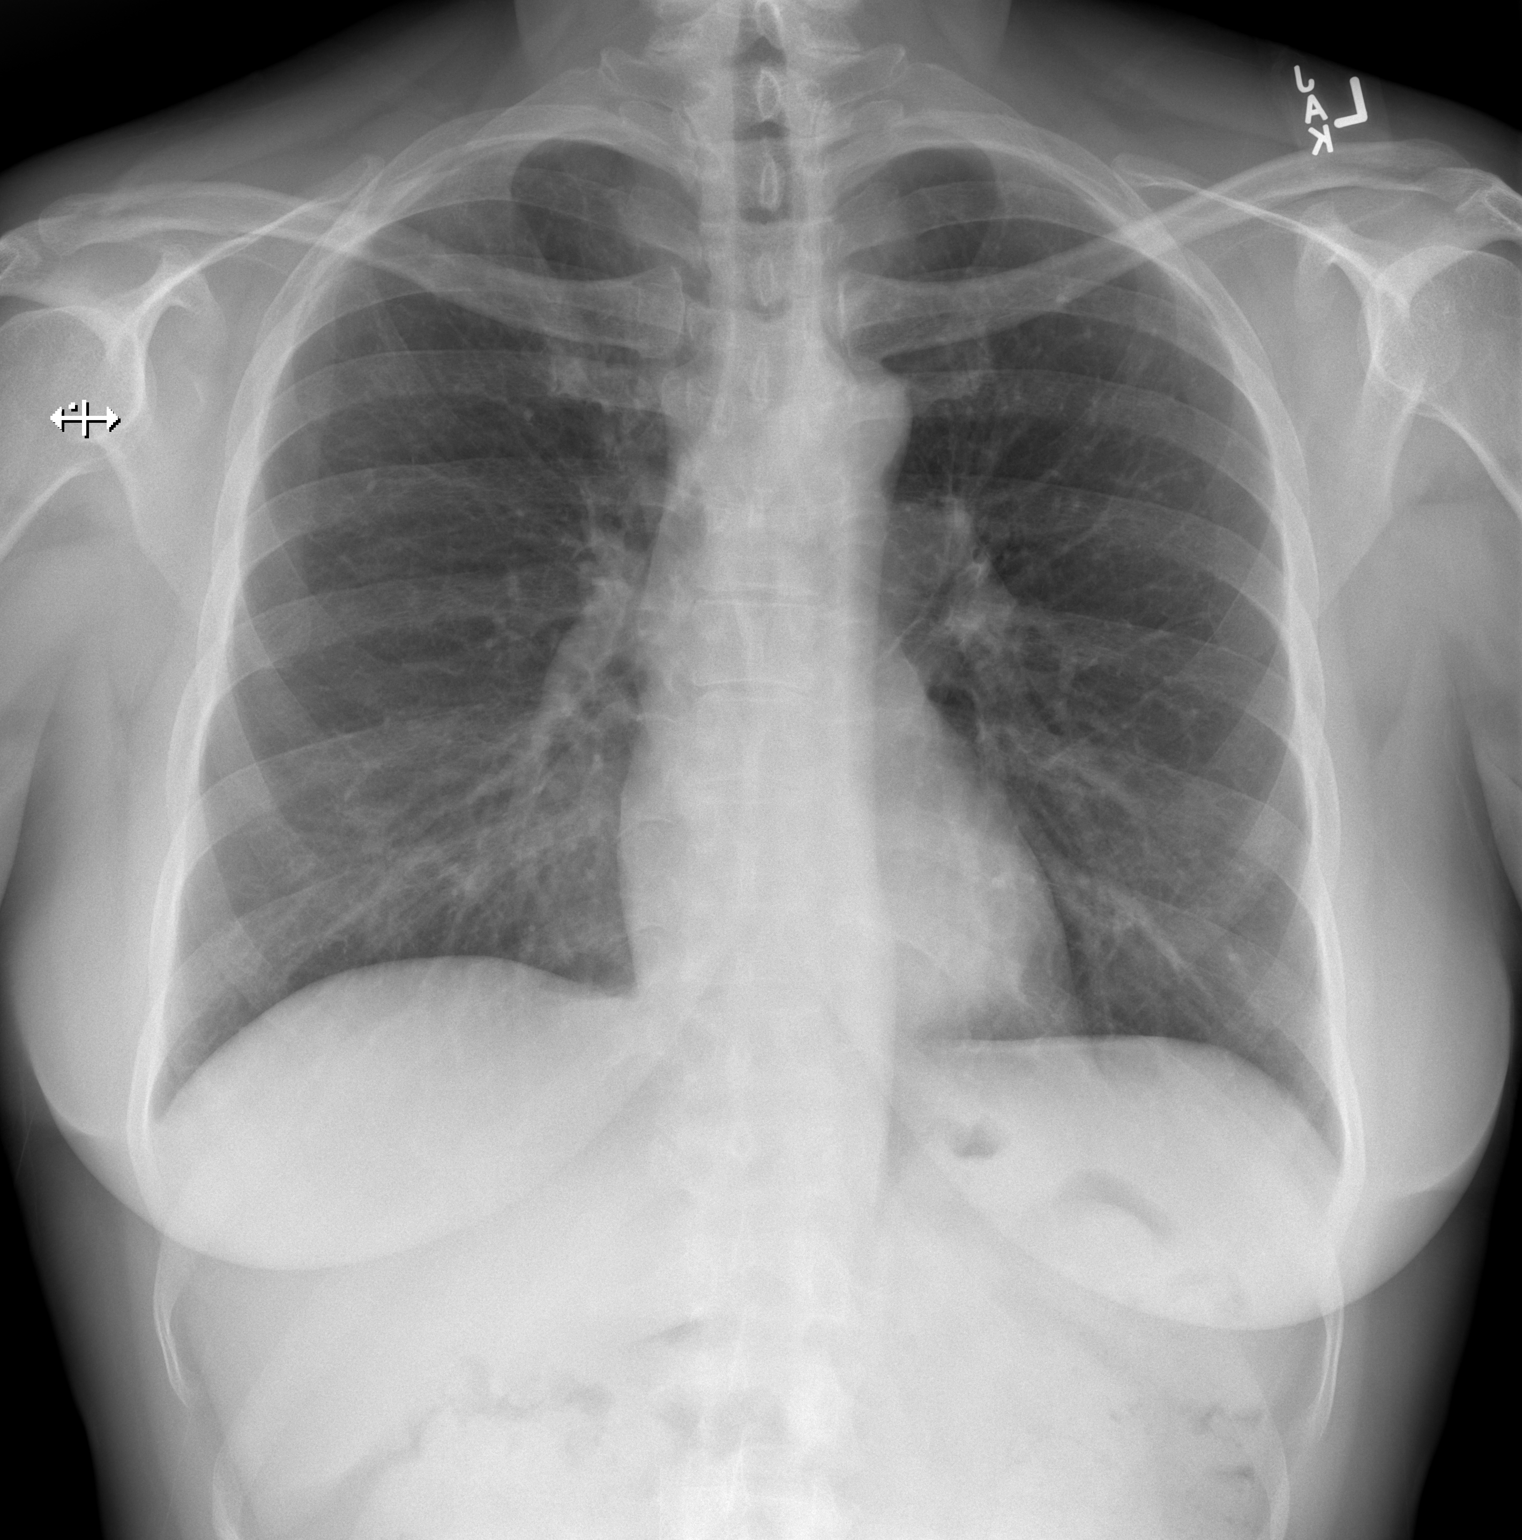

[w chest lat]
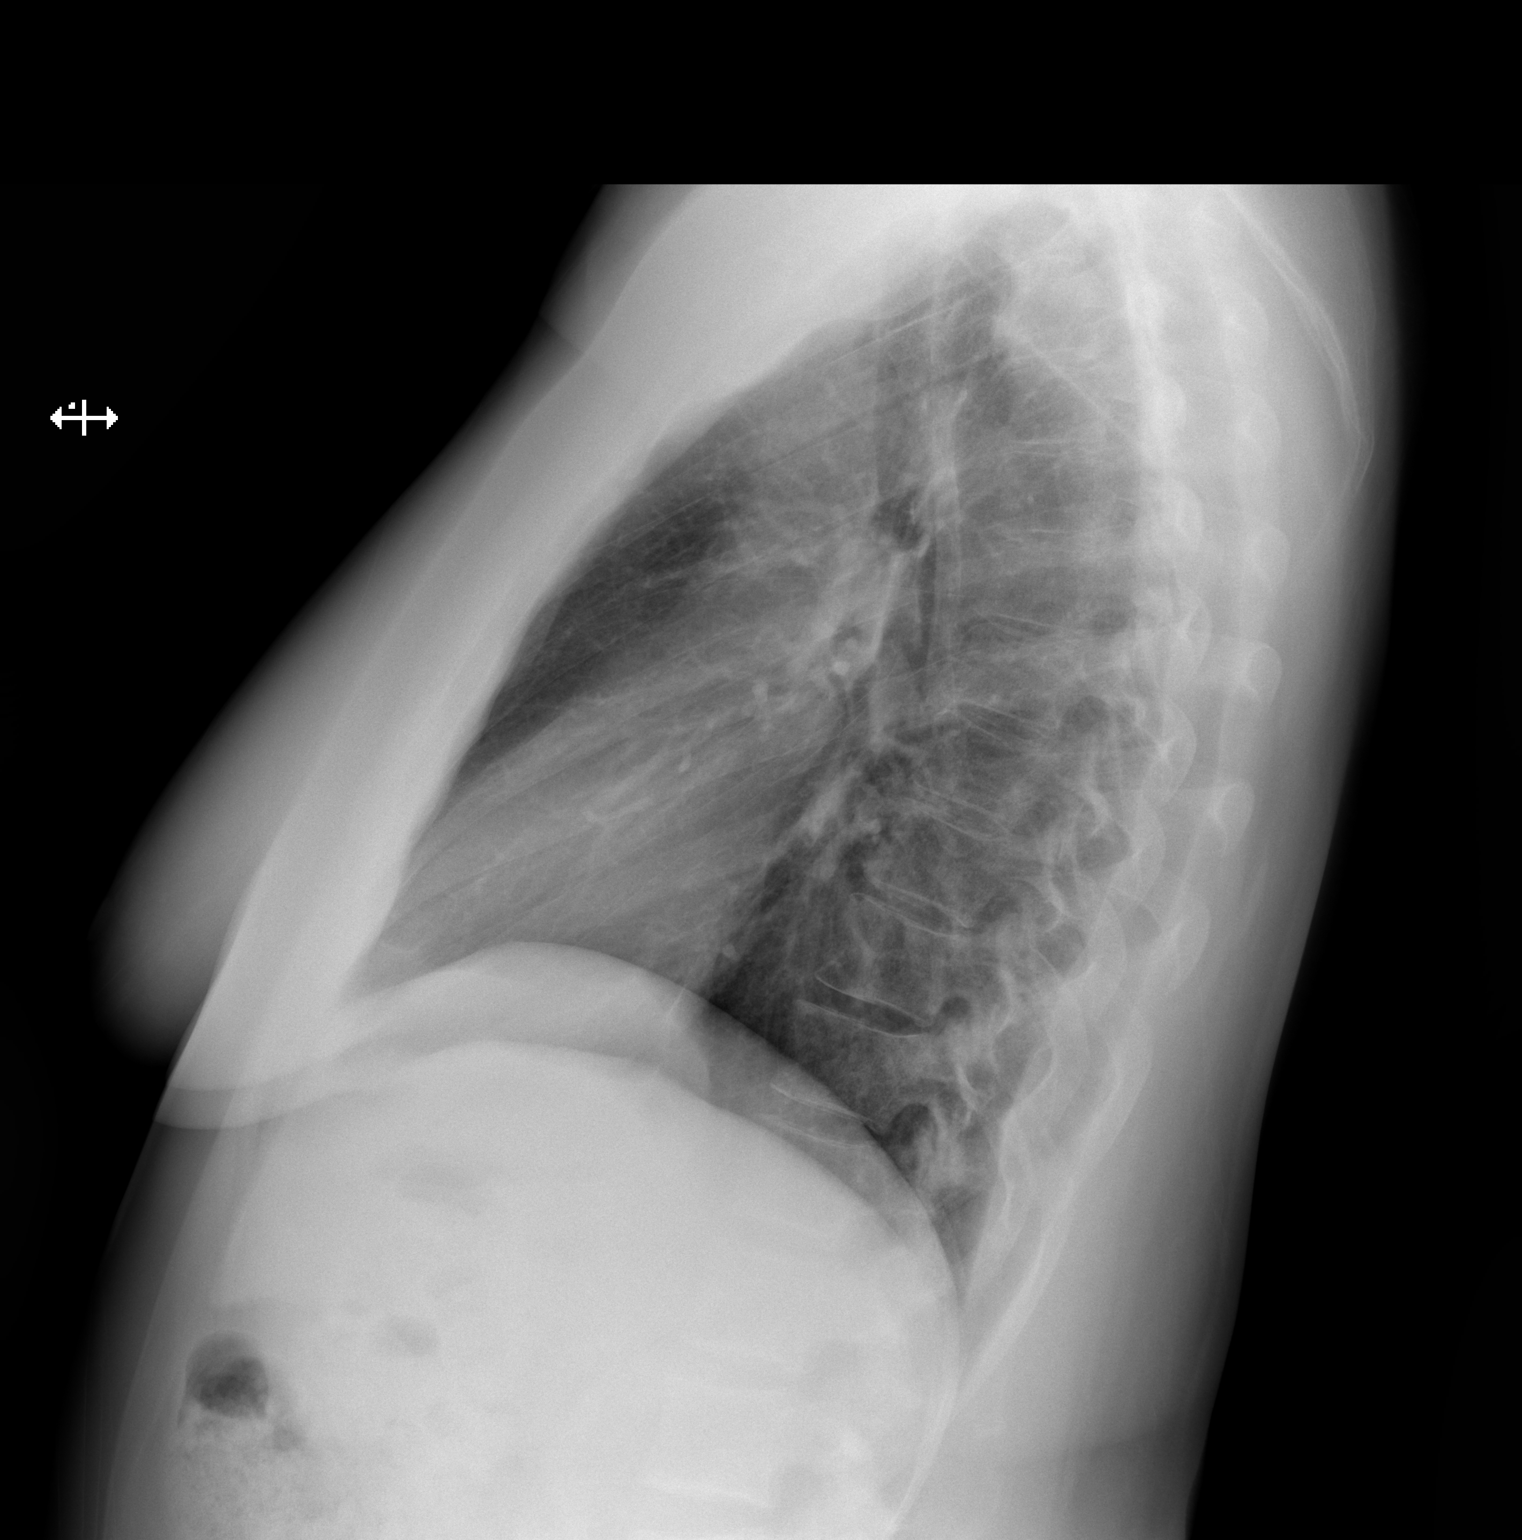

[2 of 2 positions shown; findings below may reference images not displayed]

FINDINGS: The heart size and mediastinal contours are within normal limits. No
focal airspace consolidation, pleural effusion, or pneumothorax. The
visualized skeletal structures are unremarkable.
IMPRESSION: No active cardiopulmonary disease.

## 2022-07-15 ENCOUNTER — Telehealth: Payer: Self-pay

## 2022-07-15 NOTE — Telephone Encounter (Signed)
I informed patient of result note info.  She had question. Does she start the 5FU at same time as Estradiol Cream? Start one first and then the other?

## 2022-07-15 NOTE — Telephone Encounter (Signed)
-----   Message from Salvadore Dom, MD sent at 07/12/2022  5:09 PM EDT ----- Please let the patient know that I spoke with Dr Berline Lopes. She agreed with 5FU. She also thought that vaginal estrogen would be helpful. The risks of vaginal estrogen are very small. As long as she is on Eloquis I feel it is safe for her. If she is agreeable, please call in estrace vaginal cream. 1 gram qhs x 1 week, then 1 gram 2 x a week at hs. #42.5 grams, 1 refill.

## 2022-07-16 ENCOUNTER — Other Ambulatory Visit: Payer: Self-pay

## 2022-07-16 MED ORDER — ESTRADIOL 0.1 MG/GM VA CREA: TOPICAL_CREAM | VAGINAL | 1 refills | Status: DC

## 2022-07-16 NOTE — Telephone Encounter (Signed)
Spoke with patient and informed her. Reviewed Estrogen Vag Cream directions. Rx sent.

## 2022-07-16 NOTE — Telephone Encounter (Signed)
I would have her start the vaginal estrogen now. We won't start the 5FU until she heals from the surgery.

## 2022-08-01 ENCOUNTER — Encounter: Payer: Self-pay | Admitting: Obstetrics and Gynecology

## 2022-08-01 ENCOUNTER — Ambulatory Visit (INDEPENDENT_AMBULATORY_CARE_PROVIDER_SITE_OTHER): Payer: No Typology Code available for payment source | Admitting: Obstetrics and Gynecology

## 2022-08-01 VITALS — BP 118/72 | HR 63 | Wt 196.0 lb

## 2022-08-01 DIAGNOSIS — Z9889 Other specified postprocedural states: Secondary | ICD-10-CM

## 2022-08-01 DIAGNOSIS — N893 Dysplasia of vagina, unspecified: Secondary | ICD-10-CM | POA: Diagnosis not present

## 2022-08-01 DIAGNOSIS — N952 Postmenopausal atrophic vaginitis: Secondary | ICD-10-CM | POA: Diagnosis not present

## 2022-08-01 DIAGNOSIS — N3090 Cystitis, unspecified without hematuria: Secondary | ICD-10-CM

## 2022-08-01 LAB — URINALYSIS, COMPLETE
Bilirubin Urine: NEGATIVE
Casts: NONE SEEN /LPF
Crystals: NONE SEEN /HPF
Hgb urine dipstick: NEGATIVE
Hyaline Cast: NONE SEEN /LPF
Ketones, ur: NEGATIVE
Leukocytes,Ua: NEGATIVE
Nitrite: POSITIVE — AB
Protein, ur: NEGATIVE
RBC / HPF: NONE SEEN /HPF (ref 0–2)
Specific Gravity, Urine: 1.015 (ref 1.001–1.035)
Yeast: NONE SEEN /HPF
pH: 5 (ref 5.0–8.0)

## 2022-08-01 MED ORDER — PHENAZOPYRIDINE HCL 200 MG PO TABS: 200.0000 mg | ORAL_TABLET | Freq: Three times a day (TID) | ORAL | 0 refills | Status: DC

## 2022-08-01 MED ORDER — SULFAMETHOXAZOLE-TRIMETHOPRIM 800-160 MG PO TABS: 1.0000 | ORAL_TABLET | Freq: Two times a day (BID) | ORAL | 0 refills | Status: DC

## 2022-08-01 NOTE — Patient Instructions (Signed)
Urinary Tract Infection, Adult  A urinary tract infection (UTI) is an infection of any part of the urinary tract. The urinary tract includes the kidneys, ureters, bladder, and urethra. These organs make, store, and get rid of urine in the body. An upper UTI affects the ureters and kidneys. A lower UTI affects the bladder and urethra. What are the causes? Most urinary tract infections are caused by bacteria in your genital area around your urethra, where urine leaves your body. These bacteria grow and cause inflammation of your urinary tract. What increases the risk? You are more likely to develop this condition if: You have a urinary catheter that stays in place. You are not able to control when you urinate or have a bowel movement (incontinence). You are female and you: Use a spermicide or diaphragm for birth control. Have low estrogen levels. Are pregnant. You have certain genes that increase your risk. You are sexually active. You take antibiotic medicines. You have a condition that causes your flow of urine to slow down, such as: An enlarged prostate, if you are female. Blockage in your urethra. A kidney stone. A nerve condition that affects your bladder control (neurogenic bladder). Not getting enough to drink, or not urinating often. You have certain medical conditions, such as: Diabetes. A weak disease-fighting system (immunesystem). Sickle cell disease. Gout. Spinal cord injury. What are the signs or symptoms? Symptoms of this condition include: Needing to urinate right away (urgency). Frequent urination. This may include small amounts of urine each time you urinate. Pain or burning with urination. Blood in the urine. Urine that smells bad or unusual. Trouble urinating. Cloudy urine. Vaginal discharge, if you are female. Pain in the abdomen or the lower back. You may also have: Vomiting or a decreased appetite. Confusion. Irritability or tiredness. A fever or  chills. Diarrhea. The first symptom in older adults may be confusion. In some cases, they may not have any symptoms until the infection has worsened. How is this diagnosed? This condition is diagnosed based on your medical history and a physical exam. You may also have other tests, including: Urine tests. Blood tests. Tests for STIs (sexually transmitted infections). If you have had more than one UTI, a cystoscopy or imaging studies may be done to determine the cause of the infections. How is this treated? Treatment for this condition includes: Antibiotic medicine. Over-the-counter medicines to treat discomfort. Drinking enough water to stay hydrated. If you have frequent infections or have other conditions such as a kidney stone, you may need to see a health care provider who specializes in the urinary tract (urologist). In rare cases, urinary tract infections can cause sepsis. Sepsis is a life-threatening condition that occurs when the body responds to an infection. Sepsis is treated in the hospital with IV antibiotics, fluids, and other medicines. Follow these instructions at home:  Medicines Take over-the-counter and prescription medicines only as told by your health care provider. If you were prescribed an antibiotic medicine, take it as told by your health care provider. Do not stop using the antibiotic even if you start to feel better. General instructions Make sure you: Empty your bladder often and completely. Do not hold urine for long periods of time. Empty your bladder after sex. Wipe from front to back after urinating or having a bowel movement if you are female. Use each tissue only one time when you wipe. Drink enough fluid to keep your urine pale yellow. Keep all follow-up visits. This is important. Contact a health   care provider if: Your symptoms do not get better after 1-2 days. Your symptoms go away and then return. Get help right away if: You have severe pain in  your back or your lower abdomen. You have a fever or chills. You have nausea or vomiting. Summary A urinary tract infection (UTI) is an infection of any part of the urinary tract, which includes the kidneys, ureters, bladder, and urethra. Most urinary tract infections are caused by bacteria in your genital area. Treatment for this condition often includes antibiotic medicines. If you were prescribed an antibiotic medicine, take it as told by your health care provider. Do not stop using the antibiotic even if you start to feel better. Keep all follow-up visits. This is important. This information is not intended to replace advice given to you by your health care provider. Make sure you discuss any questions you have with your health care provider. Document Revised: 05/19/2020 Document Reviewed: 05/19/2020 [employer]vier Patient Education  2023 [employer]  

## 2022-08-01 NOTE — Progress Notes (Signed)
GYNECOLOGY  VISIT   HPI: 56 y.o.   Married Black or [place] American Not Hispanic or Latino  female   [phone] with Patient's last menstrual period was 09/12/2011.   here for  4 week post op s/p leep and laser of vaginal dysplasia. At the time of surgery she was noted to have extensive decreased lugols uptake in the vagina, as well as vaginal atrophy. 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  Pathology: A. ENDOCERVIX, CURETTAGE:  Very scant mucus with admixed endocervical cells and minute fragment of  metaplastic squamous epithelium  B. ENDOCERVIX, LEEP:  Benign endocervix with squamous metaplasia  Negative for dysplasia and diagnostic viral change (see comment)  C. 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  LEFT VAGINAL SIDE WALL, BIOPSY:  Severe squamous dysplasia (HSIL, VAIN 3)  E.  RIGHT VAGINAL SIDE WALL, BIOPSY:  Severe squamous dysplasia (HSIL, VAIN 3)  COMMENT:  An immunohistochemical stain for the HPV surrogate marker p16 is  performed on the metaplastic squamous epithelium within the endocervical  LEEP.  This atrophic appearing epithelium is negative for p16 supporting  the diagnosis negative for dysplasia and diagnostic viral change within  the endocervical cone.   She was started on vaginal estrogen a few weeks after surgery. She has had an increase in watery/blood vaginal d/c which is improving.   She c/o a 1 week h/o urinary frequency and urgency, some abdominal discomfort (more right sided). No dysuria. No change in her BM's  GYNECOLOGIC HISTORY: Patient's last menstrual period was 09/12/2011. Contraception:pmp  Menopausal hormone therapy: estrace        OB History     Gravida  6   Para  3   Term      Preterm       AB  3   Living  3      SAB      IAB      Ectopic      Multiple      Live Births                 Patient Active Problem List   Diagnosis Date Noted   Mild non proliferative diabetic retinopathy ([place]) 01/01/2021   Sepsis due to COVID-19 ([place]) 03/22/2020   Type 2 diabetes mellitus without complication, without long-term current use of insulin ([place]) 12/03/2018   Hypothyroidism 07/08/2018   Bilateral calf pain 06/08/2015   Chest pain 08/22/2014   Hyperlipidemia 07/13/2014   Family history of hypertrophic cardiomyopathy 07/13/2014   DOE (dyspnea on exertion) 07/13/2014   Diabetes ([place]) 11/01/2013   Adiposity 11/01/2013   Obesity 11/01/2013   Diabetes mellitus    Malignant neoplasm of rectum ([place]) 08/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  colposcopy adequate normal 10/2015    Hyperlipidemia    Pneumonia 03/21/2020   Pulmonary embolism ([place]) 2012   STD (sexually transmitted disease) 1991   POS GC-TREATED   Thyroid disease    Type 2 Diabetes mellitus without complication ([place])     Past Surgical History:  Procedure Laterality Date   AORTA - BILATERAL FEMORAL ARTERY BYPASS GRAFT N/A 03/29/2020   Procedure: VENO VENOUS BYPASS;  Surgeon: Waynetta Sandy, MD;  Location: [facility];  Service: Vascular;  Laterality: N/A;   APPLICATION OF  [place] N/A 03/29/2020   Procedure: [place];  Surgeon: Waynetta Sandy, MD;  Location: [facility];  Service: Vascular;  Laterality: N/A;   CESAREAN SECTION  69/62/9528   CO2 LASER APPLICATION N/A 02/01/2439   Procedure: CO2 LASER APPLICATION of distal cervix and upper vagina;  Surgeon: Salvadore Dom, MD;  Location: [facility];  Service: Gynecology;  Laterality: N/A;   ENDOVASCULAR STENT INSERTION N/A 03/29/2020   Procedure: Inferior Vena Cava WallStent Graft Insertion;  Surgeon: Waynetta Sandy, MD;  Location: [place];  Service: Vascular;  Laterality: N/A;   INSERTION OF VENA CAVA FILTER Right 10/21/2010   LEEP N/A 07/02/2022   Procedure: LOOP ELECTROSURGICAL EXCISION PROCEDURE (LEEP) and colposcopy;  Surgeon: Salvadore Dom, MD;  Location: [facility];  Service: Gynecology;  Laterality: N/A;   LOW ANTERIOR BOWEL RESECTION  01/31/2011   none     PORT-A-CATH REMOVAL Right 12/22/2012   Procedure: REMOVAL PORT-A-CATH;  Surgeon: Odis Hollingshead, MD;  Location: [place];  Service: General;  Laterality: Right;   PORTACATH PLACEMENT  02/21/2011   tip in mid [id number]-rosenbower   [place] N/A 03/29/2020   Procedure: [place];  Surgeon: Waynetta Sandy, MD;  Location: [facility];  Service: Vascular;  Laterality: N/A;    Current Outpatient Medications  Medication Sig Dispense Refill   apixaban (ELIQUIS) 5 MG TABS tablet TAKE 1 TABLET(5 MG) BY MOUTH TWICE DAILY 180 tablet 3   cholecalciferol (VITAMIN D) 400 UNITS TABS tablet Take 400 Units by mouth daily.      cyanocobalamin 500 MCG tablet Take 500 mcg by mouth daily. Vitamin b 12     estradiol (ESTRACE) 0.1 MG/GM vaginal cream S: insert 1 gram intravaginally qhs x 1 week, then insert 1 gram 2 x a week at hs. 42.5 g 1   Ferrous Sulfate (IRON) 325 (65 Fe) MG TABS      Insulin Lispro (HUMALOG [place])  Inject 10 Units into the skin 3 (three) times daily with meals.      JARDIANCE 25 MG TABS tablet Take 25 mg by mouth daily.     levothyroxine (SYNTHROID, LEVOTHROID) 100 MCG tablet Take 100 mcg by mouth daily before breakfast.     Magnesium 500 MG TABS      MOUNJARO 2.5 MG/0.5ML Pen Inject into the skin. Monday     Multiple Vitamin (MULTIVITAMIN) tablet Take 1 tablet by mouth daily.     POTASSIUM PO Take by mouth.     rosuvastatin (CRESTOR) 10 MG tablet Take 10 mg by mouth daily.     Zinc 100 MG TABS Take 1 tablet by mouth daily.     No current facility-administered medications for this visit.     ALLERGIES: Metformin and related and Penicillins  Family  History  Problem Relation Age of Onset   Heart disease Father    Lung cancer Mother    Brain cancer Mother    Diabetes Mother    Hypertension Mother    Diabetes Brother    Kidney disease Brother    Cervical cancer Paternal Grandmother    Breast cancer Neg Hx     Social History   Socioeconomic History   Marital status: Married    Spouse name: Not on file   Number of children: 1   Years of education: Not on file   Highest education level: Not on file  Occupational History   Occupation: Engineer, structural  Tobacco Use   Smoking status: Never   Smokeless tobacco: Never  Substance and Sexual Activity   Alcohol use: Yes    Comment: very occ   Drug use: No   Sexual activity: Not Currently    Comment: no periods due to radiation  Other Topics Concern   Not on file  Social History Narrative   Not on file   Social Determinants of Health   Financial Resource Strain: Not on file  Food Insecurity: Not on file  Transportation Needs: Not on file  Physical Activity: Not on file  Stress: Not on file  Social Connections: Not on file  Intimate Partner Violence: Not on file    Review of Systems  Genitourinary:  Positive for frequency.    PHYSICAL EXAMINATION:    BP 118/72   Pulse 63   Wt 196 lb (88.9 kg)   LMP  09/12/2011   SpO2 100%   BMI 27.34 kg/m     General appearance: alert, cooperative and appears stated age  Pelvic: External genitalia:  no lesions              Urethra:  normal appearing urethra with no masses, tenderness or lesions              Bartholins and Skenes: normal                 Vagina: atrophic, at the apex it is friable on the right, treated with silver nitrate              Cervix:  erythematous and friable, treated with silver nitrate                Chaperone was present for exam.  1. Cystitis - Urinalysis, Complete - Urine Culture - sulfamethoxazole-trimethoprim (BACTRIM DS) 800-160 MG tablet; Take 1 tablet by mouth 2 (two) times daily. One PO BID x 3 days  Dispense: 6 tablet; Refill: 0 - phenazopyridine (PYRIDIUM) 200 MG tablet; Take 1 tablet (200 mg total) by mouth 3 (three) times daily with meals.  Dispense: 6 tablet; Refill: 0  2. History of loop electrical excision procedure (LEEP) CIN III, negative deep margins, + ectocervical margins.   3. Vaginal dysplasia Extensive, beyond the area I was able to laser Once she heals will start 5 FU F/U in 2 weeks  4. Vaginal atrophy Recently started on vaginal estrogen, will continue for at least 6 months

## 2022-08-04 LAB — URINE CULTURE
MICRO NUMBER:: 14042730
SPECIMEN QUALITY:: ADEQUATE

## 2022-08-09 ENCOUNTER — Inpatient Hospital Stay (HOSPITAL_BASED_OUTPATIENT_CLINIC_OR_DEPARTMENT_OTHER): Payer: No Typology Code available for payment source | Admitting: Nurse Practitioner

## 2022-08-09 ENCOUNTER — Inpatient Hospital Stay: Payer: No Typology Code available for payment source | Attending: Oncology

## 2022-08-09 ENCOUNTER — Encounter: Payer: Self-pay | Admitting: Nurse Practitioner

## 2022-08-09 VITALS — BP 123/84 | HR 66 | Temp 98.1°F | Resp 18 | Ht 71.0 in | Wt 196.4 lb

## 2022-08-09 DIAGNOSIS — Z86711 Personal history of pulmonary embolism: Secondary | ICD-10-CM | POA: Diagnosis not present

## 2022-08-09 DIAGNOSIS — Z85048 Personal history of other malignant neoplasm of rectum, rectosigmoid junction, and anus: Secondary | ICD-10-CM | POA: Insufficient documentation

## 2022-08-09 DIAGNOSIS — Z86718 Personal history of other venous thrombosis and embolism: Secondary | ICD-10-CM | POA: Diagnosis not present

## 2022-08-09 DIAGNOSIS — Z9221 Personal history of antineoplastic chemotherapy: Secondary | ICD-10-CM | POA: Insufficient documentation

## 2022-08-09 DIAGNOSIS — I2699 Other pulmonary embolism without acute cor pulmonale: Secondary | ICD-10-CM

## 2022-08-09 DIAGNOSIS — Z923 Personal history of irradiation: Secondary | ICD-10-CM | POA: Insufficient documentation

## 2022-08-09 LAB — CBC WITH DIFFERENTIAL (CANCER CENTER ONLY)
Abs Immature Granulocytes: 0 10*3/uL (ref 0.00–0.07)
Basophils Absolute: 0 10*3/uL (ref 0.0–0.1)
Basophils Relative: 1 %
Eosinophils Absolute: 0.1 10*3/uL (ref 0.0–0.5)
Eosinophils Relative: 1 %
HCT: 45.1 % (ref 36.0–46.0)
Hemoglobin: 14.9 g/dL (ref 12.0–15.0)
Immature Granulocytes: 0 %
Lymphocytes Relative: 30 %
Lymphs Abs: 1.1 10*3/uL (ref 0.7–4.0)
MCH: 29.5 pg (ref 26.0–34.0)
MCHC: 33 g/dL (ref 30.0–36.0)
MCV: 89.3 fL (ref 80.0–100.0)
Monocytes Absolute: 0.3 10*3/uL (ref 0.1–1.0)
Monocytes Relative: 7 %
Neutro Abs: 2.4 10*3/uL (ref 1.7–7.7)
Neutrophils Relative %: 61 %
Platelet Count: 207 10*3/uL (ref 150–400)
RBC: 5.05 MIL/uL (ref 3.87–5.11)
RDW: 13.4 % (ref 11.5–15.5)
WBC Count: 3.9 10*3/uL — ABNORMAL LOW (ref 4.0–10.5)
nRBC: 0 % (ref 0.0–0.2)

## 2022-08-09 LAB — SAVE SMEAR(SSMR), FOR PROVIDER SLIDE REVIEW

## 2022-08-09 NOTE — Progress Notes (Signed)
[facility] OFFICE PROGRESS NOTE   Diagnosis: Rectal cancer, history of venous thrombosis  INTERVAL HISTORY:   [name] returns as scheduled.  Feels well.  No complaints.  No change in bowel habits.  She has periodic rectal bleeding which is not new.  No abdominal pain.  She has a good appetite.  Objective:  Vital signs in last 24 hours:  Blood pressure 123/84, pulse 66, temperature 98.1 F (36.7 C), temperature source Oral, resp. rate 18, height '5\' 11"'$  (1.803 m), weight 196 lb 6.4 oz (89.1 kg), last menstrual period 09/12/2011, SpO2 100 %.    Lymphatics: No palpable cervical, supraclavicular, axillary or inguinal lymph nodes. Resp: Lungs clear bilaterally. Cardio: Regular rate and rhythm. GI: Abdomen soft and nontender.  No hepatosplenomegaly. Vascular: No leg edema.   Lab Results:  Lab Results  Component Value Date   WBC 3.9 (L) 08/09/2022   HGB 14.9 08/09/2022   HCT 45.1 08/09/2022   MCV 89.3 08/09/2022   PLT 207 08/09/2022   NEUTROABS PENDING 08/09/2022    Imaging:  No results found.  Medications: I have reviewed the patient's current medications.  Assessment/Plan: Rectal cancer, stage III (T3 N1b), status post a low anterior resection 01/31/2011, status post radiation with continuous infusion 5-FU 03/19/2011 through 04/30/2011. Status post FOLFOX beginning 07/03/2011 for 8 cycles. Surveillance colonoscopy 02/19/2012-negative for recurrent cancer or polyps. Surveillance colonoscopy 04/15/2014-entire examined colon normal; terminal ileum appeared normal. Next colonoscopy recommended at a 5 year interval. Surveillance CT scans negative 12/17/2013 Pulmonary embolism March 2012, status post IVC filter placement prior to cancer surgery in April 2012, followed by Dr. Chase Caller. Risk factors for pulmonary embolism felt to be related to obesity, oral contraceptive use, rectal cancer, and sedentary job.  Coumadin discontinued 2014, aspirin initiated Removal  of Port-A-Cath March 2014 Urinary/fecal incontinence. Initially improved with the pelvic physical therapy program. Diagnosed with Covid 03/10/2020 Hospitalized 03/21/2020 with fatigue/malaise, low back pain.  She was found to have multifocal patchy airspace opacities throughout both lungs consistent with multifocal pneumonia on CT 03/21/2020. MRI of the thoracic/lumbar spine 03/22/2020 showed T7 disc herniation but also showed thrombosis of the IVC and both iliac veins to the level of the IVC filter.  Subsequently Doppler study showed extensive DVT in bilateral lower extremity.  She was started on IV heparin.  Anticoagulation changed to argatroban due to thrombocytopenia, HIT antibody negative 03/30/2020.  The platelet count improved.  Vascular surgery was consulted.  She underwent catheter guided lytic therapy 03/28/2020, mechanical thrombectomy, IVC filter removal which was complicated by IVC tear requiring IVC stenting 03/29/2020.  Postoperatively she developed acute blood loss anemia with hypovolemic shock, required pressors.  She stabilized and was transferred to the medical unit then developed vomiting and was ultimately found to have an ileus/partial small bowel obstruction.  She was transitioned to Eliquis which she continues. 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 Leukopenia/mild neutropenia-chronic  Disposition: [name] remains in clinical remission from rectal cancer.  She continues colonoscopy follow-up with Dr. Collene Mares.    She is on anticoagulation after developing IVC and lower extremity DVTs.  She has not followed up with Dr. Joan Flores regarding the indication for continuing anticoagulation therapy.  She will call his office to schedule a visit.  She will continue anticoagulation for now.  She has a history of mild leukopenia/neutropenia, likely  benign normal variant.  CBC from today is  stable.  She will return for CBC and follow-up visit in 6 months.  Ned Card ANP/GNP-BC   08/09/2022  8:42 AM

## 2022-08-15 ENCOUNTER — Ambulatory Visit (INDEPENDENT_AMBULATORY_CARE_PROVIDER_SITE_OTHER): Payer: No Typology Code available for payment source | Admitting: Obstetrics and Gynecology

## 2022-08-15 ENCOUNTER — Encounter: Payer: Self-pay | Admitting: Obstetrics and Gynecology

## 2022-08-15 VITALS — BP 110/72 | HR 72 | Wt 196.0 lb

## 2022-08-15 DIAGNOSIS — N952 Postmenopausal atrophic vaginitis: Secondary | ICD-10-CM | POA: Diagnosis not present

## 2022-08-15 DIAGNOSIS — Z9889 Other specified postprocedural states: Secondary | ICD-10-CM

## 2022-08-15 DIAGNOSIS — N893 Dysplasia of vagina, unspecified: Secondary | ICD-10-CM

## 2022-08-15 NOTE — Progress Notes (Signed)
GYNECOLOGY  VISIT   HPI: 56 y.o.   Married Black or [place] American Not Hispanic or Latino  female   [phone] with Patient's last menstrual period was 09/12/2011.   here for  f/u. She is 6 weeks post op s/p leep and laser of vaginal dysplasia. At the time of surgery she was noted to have extensive decreased lugols uptake in the vagina, as well as vaginal atrophy. 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 At her 4 week f/u visit the vaginal apex and the cervix were both still friable and treated with silver nitrate. She has been using vaginal estrogen. Once her vagina heals, the plan is to start 5 FU.  GYNECOLOGIC HISTORY: Patient's last menstrual period was 09/12/2011. Contraception:PMP Menopausal hormone therapy: estrace        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[place]) 01/01/2021   Sepsis due to COVID-19 ([place]) 03/22/2020   Type 2 diabetes mellitus without complication, without long-term current use of insulin ([place]) 12/03/2018   Hypothyroidism 07/08/2018   Bilateral calf pain 06/08/2015   Chest pain 08/22/2014   Hyperlipidemia 07/13/2014   Family history of hypertrophic cardiomyopathy 07/13/2014   DOE (dyspnea on exertion) 07/13/2014   Diabetes ([place]) 11/01/2013   Adiposity 11/01/2013   Obesity 11/01/2013   Diabetes mellitus    Malignant neoplasm of rectum ([place]) 08/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  ECC with HPV effect   Cancer of colon with rectum ([place]) 2012   DVT of lower extremity, bilateral ([place]) 03/22/2020   s/p mechanical thrombectomy   Headache(784.0)    experiences migraine every couple of months   History of  COVID-19 03/10/2020   History of palpitations    resolved per dr Gwyndolyn Kaufman cardiology lov 04-03-2022   History of radiation therapy    03-19-2011 to 04-30-2011   HPV (human papilloma virus) anogenital infection 08/2014, 08/2015   normal cytology, positive for high risk HPV, negative for subtypes 16, 18, 45.  colposcopy adequate normal 10/2015    Hyperlipidemia    Pneumonia 03/21/2020   Pulmonary embolism ([place]) 2012   STD (sexually transmitted disease) 1991   POS GC-TREATED   Thyroid disease    Type 2 Diabetes mellitus without complication ([place])     Past Surgical History:  Procedure Laterality Date   AORTA - BILATERAL FEMORAL ARTERY BYPASS GRAFT N/A 03/29/2020   Procedure: VENO VENOUS BYPASS;  Surgeon: Waynetta Sandy, MD;  Location: [place];  Service: Vascular;  Laterality: N/A;   APPLICATION OF [place] N/A 03/29/2020   Procedure: [facility] MECHANICAL THROMBECTOMY;  Surgeon: Waynetta Sandy, MD;  Location: [place];  Service: Vascular;  Laterality: N/A;   CESAREAN SECTION  86/76/1949   CO2 LASER APPLICATION N/A 9/32/6712   Procedure: CO2 LASER APPLICATION of distal cervix and upper vagina;  Surgeon: Salvadore Dom, MD;  Location: [facility];  Service: Gynecology;  Laterality: N/A;   ENDOVASCULAR STENT INSERTION N/A 03/29/2020   Procedure: Inferior Vena Cava WallStent Graft Insertion;  Surgeon: Waynetta Sandy, MD;  Location: [facility];  Service: Vascular;  Laterality: N/A;   INSERTION OF VENA CAVA FILTER Right 10/21/2010   LEEP N/A 07/02/2022   Procedure: LOOP ELECTROSURGICAL EXCISION PROCEDURE (LEEP) and colposcopy;  Surgeon: Salvadore Dom, MD;  Location: [facility];  Service: Gynecology;  Laterality: N/A;   LOW ANTERIOR BOWEL RESECTION  01/31/2011   none     PORT-A-CATH REMOVAL Right 12/22/2012   Procedure: REMOVAL PORT-A-CATH;  Surgeon: Odis Hollingshead, MD;  Location: [facility];  Service:  General;  Laterality: Right;   PORTACATH PLACEMENT  02/21/2011   tip in mid svc-rosenbower   [place] N/A 03/29/2020   Procedure: [place];  Surgeon: Waynetta Sandy, MD;  Location: [facility];  Service: Vascular;  Laterality: N/A;    Current Outpatient Medications  Medication Sig Dispense Refill   apixaban (ELIQUIS) 5 MG TABS tablet TAKE 1 TABLET(5 MG) BY MOUTH TWICE DAILY 180 tablet 3   cholecalciferol (VITAMIN D) 400 UNITS TABS tablet Take 400 Units by mouth daily.      cyanocobalamin 500 MCG tablet Take 500 mcg by mouth daily. Vitamin b 12     estradiol (ESTRACE) 0.1 MG/GM vaginal cream S: insert 1 gram intravaginally qhs x 1 week, then insert 1 gram 2 x a week at hs. 42.5 g 1   Ferrous Sulfate (IRON) 325 (65 Fe) MG TABS      Insulin Lispro (HUMALOG [place]) Inject 10 Units into the skin 3 (three) times daily with meals.      JARDIANCE 25 MG TABS tablet Take 25 mg by mouth daily.     levothyroxine (SYNTHROID, LEVOTHROID) 100 MCG tablet Take 100 mcg by mouth daily before breakfast.     Magnesium 500 MG TABS      MOUNJARO 2.5 MG/0.5ML Pen Inject into the skin. Monday     Multiple Vitamin (MULTIVITAMIN) tablet Take 1 tablet by mouth daily.     phenazopyridine (PYRIDIUM) 200 MG tablet Take 1 tablet (200 mg total) by mouth 3 (three) times daily with meals. (Patient not taking: Reported on 08/09/2022) 6 tablet 0   POTASSIUM PO Take by mouth.     rosuvastatin (CRESTOR) 10 MG tablet Take 10 mg by mouth daily.     sulfamethoxazole-trimethoprim (BACTRIM DS) 800-160 MG tablet Take 1 tablet by mouth 2 (two) times daily. One PO BID x 3 days (Patient not taking: Reported on 08/09/2022) 6 tablet 0   Zinc 100 MG TABS Take 1 tablet by mouth daily.     No current facility-administered medications for this visit.     ALLERGIES: Metformin and related and Penicillins  Family History  Problem Relation Age of Onset   Heart disease Father     Lung cancer Mother    Brain cancer Mother    Diabetes Mother    Hypertension Mother    Diabetes Brother    Kidney disease Brother    Cervical cancer Paternal Grandmother    Breast cancer Neg Hx     Social History   Socioeconomic History   Marital status: Married    Spouse name: Not on file   Number of children: 1   Years of education: Not on file   Highest education level: Not on file  Occupational History  Occupation: Engineer, structural  Tobacco Use   Smoking status: Never   Smokeless tobacco: Never  Substance and Sexual Activity   Alcohol use: Yes    Comment: very occ   Drug use: No   Sexual activity: Not Currently    Comment: no periods due to radiation  Other Topics Concern   Not on file  Social History Narrative   Not on file   Social Determinants of Health   Financial Resource Strain: Not on file  Food Insecurity: Not on file  Transportation Needs: Not on file  Physical Activity: Not on file  Stress: Not on file  Social Connections: Not on file  Intimate Partner Violence: Not on file    ROS  PHYSICAL EXAMINATION:    LMP 09/12/2011     General appearance: alert, cooperative and appears stated age  Pelvic: External genitalia:  no lesions              Urethra:  normal appearing urethra with no masses, tenderness or lesions              Bartholins and Skenes: normal                 Vagina: atrophic appearing vagina with normal color and discharge, the apex is healing              Cervix: no lesions and still friable on the posterior lip, improved. Area treated with silver nitrate              Chaperone was present for exam.   1. History of loop electrical excision procedure (LEEP) Slowly healing, cervix treated with silver nitrate Will f/u in 2 weeks  2. Vaginal dysplasia Once she has healed I will call in 5 FU 5% cream, insert 2 gram vaginally q week x 10 weeks.   3. Vaginal atrophy Increase estrace cream to 3 x a week

## 2022-09-02 ENCOUNTER — Ambulatory Visit (INDEPENDENT_AMBULATORY_CARE_PROVIDER_SITE_OTHER): Payer: No Typology Code available for payment source | Admitting: Obstetrics and Gynecology

## 2022-09-02 ENCOUNTER — Encounter: Payer: Self-pay | Admitting: Obstetrics and Gynecology

## 2022-09-02 VITALS — BP 118/70 | HR 81 | Resp 20

## 2022-09-02 DIAGNOSIS — N893 Dysplasia of vagina, unspecified: Secondary | ICD-10-CM

## 2022-09-02 DIAGNOSIS — Z9889 Other specified postprocedural states: Secondary | ICD-10-CM

## 2022-09-02 DIAGNOSIS — N952 Postmenopausal atrophic vaginitis: Secondary | ICD-10-CM

## 2022-09-02 NOTE — Progress Notes (Signed)
GYNECOLOGY  VISIT   HPI: 56 y.o.   Married Black or [place] American Not Hispanic or Latino  female   [phone] with Patient's last menstrual period was 09/12/2011.   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  At the time of surgery she was noted to have extensive decreased lugols uptake in the vagina, as well as vaginal atrophy. 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 At her 4 and 6 week f/u visits the vaginal apex and the cervix were both still friable and treated with silver nitrate. She has been using vaginal estrogen. Once her vagina heals, the plan is to start 5 FU. She has been using the vaginal estrogen 3 x a week.   She is diabetic, under good control.   GYNECOLOGIC HISTORY: Patient's last menstrual period was 09/12/2011. 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[place]) 01/01/2021   Sepsis due to COVID-19 ([place]) 03/22/2020   Type 2 diabetes mellitus without complication, without long-term current use of insulin ([place]) 12/03/2018   Hypothyroidism 07/08/2018   Bilateral calf pain 06/08/2015   Chest pain 08/22/2014   Hyperlipidemia 07/13/2014   Family history of hypertrophic cardiomyopathy 07/13/2014   DOE (dyspnea on exertion) 07/13/2014   Diabetes ([place]) 11/01/2013   Adiposity 11/01/2013   Obesity 11/01/2013   Diabetes mellitus    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  colposcopy adequate normal 10/2015    Hyperlipidemia    Pneumonia 03/21/2020   Pulmonary embolism ([place]) 2012   STD (sexually transmitted disease) 1991   POS GC-TREATED   Thyroid disease    Type 2 Diabetes mellitus without complication ([place])     Past Surgical History:  Procedure Laterality Date   AORTA - BILATERAL FEMORAL ARTERY BYPASS GRAFT N/A 03/29/2020   Procedure: VENO VENOUS BYPASS;  Surgeon: Waynetta Sandy, MD;  Location: [place];  Service: Vascular;  Laterality: N/A;   APPLICATION OF [place] N/A 03/29/2020   Procedure: [facility] MECHANICAL THROMBECTOMY;  Surgeon: Waynetta Sandy, MD;  Location: [place];  Service: Vascular;  Laterality: N/A;   CESAREAN SECTION  13/05/6577   CO2 LASER APPLICATION N/A 4/69/6295  Procedure: CO2 LASER APPLICATION of distal cervix and upper vagina;  Surgeon: Salvadore Dom, MD;  Location: [facility];  Service: Gynecology;  Laterality: N/A;   ENDOVASCULAR STENT INSERTION N/A 03/29/2020   Procedure: Inferior Vena Cava WallStent Graft Insertion;  Surgeon: Waynetta Sandy, MD;  Location: [place];  Service: Vascular;  Laterality: N/A;   INSERTION OF VENA CAVA FILTER Right 10/21/2010   LEEP N/A 07/02/2022   Procedure: LOOP ELECTROSURGICAL EXCISION PROCEDURE (LEEP) and colposcopy;  Surgeon: Salvadore Dom, MD;  Location: [facility];  Service: Gynecology;  Laterality: N/A;   LOW ANTERIOR BOWEL RESECTION  01/31/2011   none     PORT-A-CATH REMOVAL Right 12/22/2012   Procedure:  REMOVAL PORT-A-CATH;  Surgeon: Odis Hollingshead, MD;  Location: [place];  Service: General;  Laterality: Right;   PORTACATH PLACEMENT  02/21/2011   tip in mid svc-rosenbower   [place] N/A 03/29/2020   Procedure: [place];  Surgeon: Waynetta Sandy, MD;  Location: [place];  Service: Vascular;  Laterality: N/A;    Current Outpatient Medications  Medication Sig Dispense Refill   Accu-Chek FastClix Lancets MISC twice a day for 30 days     apixaban (ELIQUIS) 5 MG TABS tablet TAKE 1 TABLET(5 MG) BY MOUTH TWICE DAILY 180 tablet 3   Blood Glucose Monitoring Suppl (ACCU-CHEK GUIDE) w/Device KIT as directed twice a day for 30 days     cholecalciferol (VITAMIN D) 400 UNITS TABS tablet Take 400 Units by mouth daily.      cyanocobalamin 500 MCG tablet Take 500 mcg by mouth daily. Vitamin b 12     estradiol (ESTRACE) 0.1 MG/GM vaginal cream S: insert 1 gram intravaginally qhs x 1 week, then insert 1 gram 2 x a week at hs. 42.5 g 1   Ferrous Sulfate (IRON) 325 (65 Fe) MG TABS      glucose blood (ACCU-CHEK GUIDE) test strip as directed In Vitro twice a day for 30 days     Insulin Lispro (HUMALOG ) Inject 10 Units into the skin 3 (three) times daily with meals.      JARDIANCE 25 MG TABS tablet Take 25 mg by mouth daily.     levothyroxine (SYNTHROID, LEVOTHROID) 100 MCG tablet Take 100 mcg by mouth daily before breakfast.     Magnesium 500 MG TABS      MOUNJARO 2.5 MG/0.5ML Pen Inject into the skin. Monday     Multiple Vitamin (MULTIVITAMIN) tablet Take 1 tablet by mouth daily.     POTASSIUM PO Take by mouth.     rosuvastatin (CRESTOR) 10 MG tablet Take 10 mg by mouth daily.     Zinc 100 MG TABS Take 1 tablet by mouth daily.     No current facility-administered medications for this visit.     ALLERGIES: Metformin and related and Penicillins  Family History  Problem Relation Age of Onset   Heart disease Father     Lung cancer Mother    Brain cancer Mother    Diabetes Mother    Hypertension Mother    Diabetes Brother    Kidney disease Brother    Cervical cancer Paternal Grandmother    Breast cancer Neg Hx     Social History   Socioeconomic History   Marital status: Married    Spouse name: Not on file   Number of children: 1   Years of education: Not on file   Highest education  level: Not on file  Occupational History   Occupation: Engineer, structural  Tobacco Use   Smoking status: Never   Smokeless tobacco: Never  Substance and Sexual Activity   Alcohol use: Yes    Comment: very occ   Drug use: No   Sexual activity: Not Currently    Comment: no periods due to radiation  Other Topics Concern   Not on file  Social History Narrative   Not on file   Social Determinants of Health   Financial Resource Strain: Not on file  Food Insecurity: Not on file  Transportation Needs: Not on file  Physical Activity: Not on file  Stress: Not on file  Social Connections: Not on file  Intimate Partner Violence: Not on file    Review of Systems  All other systems reviewed and are negative.   PHYSICAL EXAMINATION:    BP 118/70 (BP Location: Right Arm, Patient Position: Sitting)   Pulse 81   Resp 20   LMP 09/12/2011   SpO2 99%     General appearance: alert, cooperative and appears stated age  Pelvic: External genitalia:  no lesions              Urethra:  normal appearing urethra with no masses, tenderness or lesions              Bartholins and Skenes: normal                 Vagina: atrophic appearing vagina still friable at the apex, treated with silver nitrate.               Cervix:  flush with the vagina, friable, treated with silver nitrate.                Chaperone was present for exam.  1. History of loop electrical excision procedure (LEEP) Healing very slowly. She is now 2 months out and the cervix, upper vagina continue to be friable.  She has well controlled diabetes.   2.  Vaginal dysplasia She needs treatment for the vaginal dysplasia, but hasn't healed from her surgery yet.  Will continue with 3 x a week vaginal estrogen and f/u in one month. Once she has healed I will call in 5 FU 5% cream, insert 2 gram vaginally q week x 10 weeks.    3. Vaginal atrophy Continue with vaginal estrogen 3 x a week.

## 2022-09-30 ENCOUNTER — Encounter: Payer: Self-pay | Admitting: Obstetrics and Gynecology

## 2022-09-30 ENCOUNTER — Ambulatory Visit (INDEPENDENT_AMBULATORY_CARE_PROVIDER_SITE_OTHER): Payer: No Typology Code available for payment source | Admitting: Obstetrics and Gynecology

## 2022-09-30 VITALS — BP 110/62 | HR 66 | Wt 196.0 lb

## 2022-09-30 DIAGNOSIS — N893 Dysplasia of vagina, unspecified: Secondary | ICD-10-CM

## 2022-09-30 DIAGNOSIS — Z9889 Other specified postprocedural states: Secondary | ICD-10-CM | POA: Diagnosis not present

## 2022-09-30 DIAGNOSIS — N952 Postmenopausal atrophic vaginitis: Secondary | ICD-10-CM

## 2022-09-30 NOTE — Progress Notes (Signed)
GYNECOLOGY  VISIT   HPI: 56 y.o.   Married Black or [place] American Not Hispanic or Latino  female   [phone] with Patient's last menstrual period was 09/12/2011.   here for post leep follow up. She states that she no longer spotting.  She had a leep and laser of vaginal dysplasia in 9/23. She had extensive areas of decreased lugols uptake at the time of her surgery, vaginal biopsy returned with VAIN III. She has been using vaginal estrogen, but healing slowly.   GYNECOLOGIC HISTORY: Patient's last menstrual period was 09/12/2011. Contraception:mp Menopausal hormone therapy: estrace.         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[place]) 01/01/2021   Sepsis due to COVID-19 ([place]) 03/22/2020   Type 2 diabetes mellitus without complication, without long-term current use of insulin ([place]) 12/03/2018   Hypothyroidism 07/08/2018   Bilateral calf pain 06/08/2015   Chest pain 08/22/2014   Hyperlipidemia 07/13/2014   Family history of hypertrophic cardiomyopathy 07/13/2014   DOE (dyspnea on exertion) 07/13/2014   Diabetes ([place]) 11/01/2013   Adiposity 11/01/2013   Obesity 11/01/2013   Diabetes mellitus    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  colposcopy adequate normal 10/2015    Hyperlipidemia    Pneumonia 03/21/2020   Pulmonary embolism ([place]) 2012   STD (sexually transmitted disease) 1991   POS GC-TREATED   Thyroid disease    Type 2 Diabetes mellitus without complication ([place])     Past Surgical History:  Procedure Laterality Date   AORTA - BILATERAL FEMORAL ARTERY BYPASS GRAFT N/A 03/29/2020   Procedure: VENO VENOUS BYPASS;  Surgeon: Waynetta Sandy, MD;  Location: [place];  Service: Vascular;  Laterality: N/A;   APPLICATION OF [place] N/A 03/29/2020   Procedure: [facility] MECHANICAL THROMBECTOMY;  Surgeon: Waynetta Sandy, MD;  Location: [place];  Service: Vascular;  Laterality: N/A;   CESAREAN SECTION  58/85/0277   CO2 LASER APPLICATION N/A 01/30/8785   Procedure: CO2 LASER APPLICATION of distal cervix and upper vagina;  Surgeon: Salvadore Dom, MD;  Location: [facility];  Service: Gynecology;  Laterality: N/A;   ENDOVASCULAR STENT INSERTION N/A 03/29/2020   Procedure: Inferior Vena Cava WallStent Graft Insertion;  Surgeon: Waynetta Sandy,  MD;  [facility];  Service: Vascular;  Laterality: N/A;   INSERTION OF VENA CAVA FILTER Right 10/21/2010   LEEP N/A 07/02/2022   Procedure: LOOP ELECTROSURGICAL EXCISION PROCEDURE (LEEP) and colposcopy;  Surgeon: Salvadore Dom, MD;  Location: [facility];  Service: Gynecology;  Laterality: N/A;   LOW ANTERIOR BOWEL RESECTION  01/31/2011   none     PORT-A-CATH REMOVAL Right 12/22/2012   Procedure: REMOVAL PORT-A-CATH;  Surgeon: Odis Hollingshead, MD;  Location: [place];  Service: General;  Laterality: Right;   PORTACATH PLACEMENT  02/21/2011   tip in mid svc-rosenbower   [place] N/A 03/29/2020   Procedure: [place];  Surgeon: Waynetta Sandy, MD;  Location: [place];  Service: Vascular;  Laterality: N/A;    Current Outpatient Medications  Medication Sig Dispense Refill   Accu-Chek FastClix Lancets MISC twice a day for 30 days     apixaban (ELIQUIS) 5 MG TABS tablet TAKE 1 TABLET(5 MG) BY MOUTH TWICE DAILY 180 tablet 3   Blood Glucose Monitoring Suppl (ACCU-CHEK GUIDE) w/Device KIT as directed twice a day for 30 days     cholecalciferol (VITAMIN D) 400 UNITS TABS tablet Take 400 Units by mouth daily.      cyanocobalamin 500 MCG tablet Take 500 mcg by mouth daily. Vitamin b 12     estradiol (ESTRACE) 0.1 MG/GM vaginal cream S: insert 1 gram intravaginally qhs x 1 week, then insert 1 gram 2 x a week at hs. 42.5 g 1   Ferrous Sulfate (IRON) 325 (65 Fe) MG TABS      glucose blood (ACCU-CHEK GUIDE) test strip as directed In Vitro twice a day for 30 days     Insulin Lispro (HUMALOG [place]) Inject 10 Units into the skin 3 (three) times daily with meals.      JARDIANCE 25 MG TABS tablet Take 25 mg by mouth daily.     levothyroxine (SYNTHROID, LEVOTHROID) 100 MCG tablet Take 100 mcg by mouth daily before breakfast.     Magnesium 500 MG TABS      MOUNJARO 5 MG/0.5ML Pen Inject 5 mg every week by subcutaneous route.     Multiple Vitamin (MULTIVITAMIN) tablet Take 1 tablet by mouth daily.     POTASSIUM PO Take by mouth.     rosuvastatin (CRESTOR) 10 MG tablet Take 10 mg by mouth daily.     Zinc 100 MG TABS Take 1 tablet by mouth daily.     No current facility-administered medications for this visit.     ALLERGIES: Metformin and related and Penicillins  Family History  Problem Relation Age of Onset   Heart disease Father    Lung cancer Mother    Brain cancer Mother    Diabetes Mother    Hypertension Mother    Diabetes Brother    Kidney disease Brother    Cervical cancer Paternal Grandmother    Breast cancer Neg Hx     Social History   Socioeconomic History    Marital status: Married    Spouse name: Not on file   Number of children: 1   Years of education: Not on file   Highest education level: Not on file  Occupational History   Occupation: Engineer, structural  Tobacco Use   Smoking status: Never   Smokeless tobacco: Never  Substance and Sexual Activity   Alcohol use: Yes    Comment: very occ   Drug use: No  Sexual activity: Not Currently    Comment: no periods due to radiation  Other Topics Concern   Not on file  Social History Narrative   Not on file   Social Determinants of Health   Financial Resource Strain: Not on file  Food Insecurity: Not on file  Transportation Needs: Not on file  Physical Activity: Not on file  Stress: Not on file  Social Connections: Not on file  Intimate Partner Violence: Not on file    ROS  PHYSICAL EXAMINATION:    BP 110/62   Pulse 66   Wt 196 lb (88.9 kg)   LMP 09/12/2011   SpO2 100%   BMI 27.34 kg/m     General appearance: alert, cooperative and appears stated age Pelvic: External genitalia:  no lesions              Urethra:  normal appearing urethra with no masses, tenderness or lesions              Bartholins and Skenes: normal                 Vagina: atrophic appearing vagina still healing in the right vaginal apex. 80-90% improved              Cervix:  flush with the vagina              Chaperone was present for exam.  1. History of loop electrical excision procedure (LEEP) She continues to heal slowly. Now 3 months out, 80-90% improved.  She has well controlled diabetes  2. Vaginal dysplasia As soon as she has healed from treatment, will start 5 FU  3. Vaginal atrophy Continue with 3 x a week vaginal estrogen

## 2022-10-16 ENCOUNTER — Ambulatory Visit (INDEPENDENT_AMBULATORY_CARE_PROVIDER_SITE_OTHER): Payer: No Typology Code available for payment source | Admitting: Nurse Practitioner

## 2022-10-16 ENCOUNTER — Encounter: Payer: Self-pay | Admitting: Nurse Practitioner

## 2022-10-16 VITALS — BP 118/80 | HR 72 | Temp 98.4°F | Ht 71.0 in | Wt 194.4 lb

## 2022-10-16 DIAGNOSIS — R051 Acute cough: Secondary | ICD-10-CM | POA: Diagnosis not present

## 2022-10-16 DIAGNOSIS — E113293 Type 2 diabetes mellitus with mild nonproliferative diabetic retinopathy without macular edema, bilateral: Secondary | ICD-10-CM | POA: Diagnosis not present

## 2022-10-16 DIAGNOSIS — E039 Hypothyroidism, unspecified: Secondary | ICD-10-CM | POA: Diagnosis not present

## 2022-10-16 DIAGNOSIS — R0989 Other specified symptoms and signs involving the circulatory and respiratory systems: Secondary | ICD-10-CM

## 2022-10-16 DIAGNOSIS — Z6827 Body mass index (BMI) 27.0-27.9, adult: Secondary | ICD-10-CM

## 2022-10-16 DIAGNOSIS — Z2821 Immunization not carried out because of patient refusal: Secondary | ICD-10-CM

## 2022-10-16 MED ORDER — GVOKE HYPOPEN 2-PACK 0.5 MG/0.1ML ~~LOC~~ SOAJ: 0.5000 mg | SUBCUTANEOUS | 3 refills | Status: DC | PRN

## 2022-10-16 NOTE — Patient Instructions (Signed)

## 2022-10-16 NOTE — Progress Notes (Signed)
[name],acting as a Education administrator for [name], FNP.,have documented all relevant documentation on the behalf of [name], FNP,as directed by  [name], FNP while in the presence of [name], [name].    Subjective:     Patient ID: [name] , female    DOB: [birth year] , 56 y.o.   MRN: [id number]   Chief Complaint  Patient presents with   Diabetes    HPI  Patient presents today for a diabetes check. Continues to be followed by Endocrinology seen last within the last 4 months. She is on Mounjaro 5 mg weekly. Patient reports compliance with her meds. She has not been wearing her Elenor Legato due to taking large doses of vitamin c. The week prior blood sugars had been running in the 60's. She has slowed down on her insulin intake when her blood sugar is low.  Patient reports she is having a lot of nasal and chest congestion and cough. She reports she been sick for the past week. She is feeling better, she did not take the covid test but her son did who was sick after her. She has clear secretions when she coughs. She has been taking vitamin c, zinc. No cough medications only cough drops. Continues to have a runny nose and chest congestion  Wt Readings from Last 3 Encounters: 10/16/22 : 194 lb 6.4 oz (88.2 kg) 09/30/22 : 196 lb (88.9 kg) 08/15/22 : 196 lb (88.9 kg)    Diabetes She presents for her follow-up diabetic visit. She has type 2 diabetes mellitus. Pertinent negatives for diabetes include no polydipsia, no polyphagia and no polyuria. There are no hypoglycemic complications. There are no diabetic complications. Risk factors for coronary artery disease include sedentary lifestyle and diabetes mellitus. When asked about meal planning, she reported none. There is no change in her home blood glucose trend. She does not see a podiatrist.Eye exam is current.     Past Medical History:  Diagnosis Date   ASCUS with positive high risk HPV cervical 11/2018   Colposcopy  normal.  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  colposcopy adequate normal 10/2015    Hyperlipidemia    Pneumonia 03/21/2020   Pulmonary embolism ([place]) 2012   STD (sexually transmitted disease) 1991   POS GC-TREATED   Thyroid disease    Type 2 Diabetes mellitus without complication ([place])      Family History  Problem Relation Age of Onset   Heart disease Father    Lung cancer Mother    Brain cancer Mother    Diabetes Mother    Hypertension Mother    Diabetes Brother    Kidney disease Brother    Cervical cancer Paternal Grandmother    Breast cancer Neg Hx      Current Outpatient Medications:    Accu-Chek FastClix Lancets MISC, twice a day for 30 days, Disp: , Rfl:    apixaban (ELIQUIS) 5 MG TABS tablet, TAKE 1 TABLET(5 MG)  BY MOUTH TWICE DAILY, Disp: 180 tablet, Rfl: 3   Blood Glucose Monitoring Suppl (ACCU-CHEK GUIDE) w/Device KIT, as directed twice a day for 30 days, Disp: , Rfl:    cholecalciferol (VITAMIN D) 400 UNITS TABS tablet, Take 400 Units by mouth daily. , Disp: , Rfl:    cyanocobalamin 500 MCG tablet, Take 500 mcg by mouth daily. Vitamin b 12, Disp: , Rfl:    estradiol (ESTRACE) 0.1 MG/GM vaginal cream, S: insert 1 gram intravaginally qhs x 1 week, then insert 1 gram 2 x a week at hs., Disp: 42.5 g, Rfl: 1   Ferrous Sulfate (IRON) 325 (65 Fe) MG TABS, , Disp: , Rfl:    Glucagon (GVOKE HYPOPEN 2-PACK) 0.5 MG/0.1ML SOAJ, Inject 0.5 mg into the skin as needed., Disp: 0.2 mL,  Rfl: 3   glucose blood (ACCU-CHEK GUIDE) test strip, as directed In Vitro twice a day for 30 days, Disp: , Rfl:    Insulin Lispro (HUMALOG [place]), Inject 10 Units into the skin 3 (three) times daily with meals. , Disp: , Rfl:    JARDIANCE 25 MG TABS tablet, Take 25 mg by mouth daily., Disp: , Rfl:    levothyroxine (SYNTHROID, LEVOTHROID) 100 MCG tablet, Take 100 mcg by mouth daily before breakfast., Disp: , Rfl:    Magnesium 500 MG TABS, , Disp: , Rfl:    MOUNJARO 5 MG/0.5ML Pen, Inject 5 mg every week by subcutaneous route., Disp: , Rfl:    Multiple Vitamin (MULTIVITAMIN) tablet, Take 1 tablet by mouth daily., Disp: , Rfl:    POTASSIUM PO, Take by mouth., Disp: , Rfl:    rosuvastatin (CRESTOR) 10 MG tablet, Take 10 mg by mouth daily., Disp: , Rfl:    Zinc 100 MG TABS, Take 1 tablet by mouth daily., Disp: , Rfl:    Allergies  Allergen Reactions   Metformin And Related Other (See Comments)    Chest pain   Penicillins Hives     Review of Systems  Constitutional: Negative.   HENT:  Positive for congestion.   Eyes: Negative.   Respiratory:  Positive for cough.        Chest congestion and runny nose   Cardiovascular: Negative.   Endocrine: Negative for polydipsia, polyphagia and polyuria.  Musculoskeletal: Negative.   Skin: Negative.   Neurological: Negative.   Psychiatric/Behavioral: Negative.       Today's Vitals   10/16/22 0941  BP: 118/80  Pulse: 72  Temp: 98.4 F (36.9 C)  Weight: 194 lb 6.4 oz (88.2 kg)  Height: _0  (1.803 m)  PainSc: 0-No pain   Body mass index is 27.11 kg/m.  Wt Readings from Last 3 Encounters:  10/16/22 194 lb 6.4 oz (88.2 kg)  09/30/22 196 lb (88.9 kg)  08/15/22 196 lb (88.9 kg)     Objective:  Physical Exam Vitals reviewed.  Constitutional:      General: She is not in acute distress.    Appearance: Normal appearance. She is well-developed.  Cardiovascular:     Rate and Rhythm: Normal rate and regular rhythm.     Heart sounds: Normal  heart sounds. No murmur heard. Pulmonary:     Effort: Pulmonary effort is normal. No respiratory distress.     Breath sounds: Normal breath sounds.  Chest:     Chest wall: No tenderness.  Musculoskeletal:        General: Normal range of motion.     Cervical back: Normal range of motion and neck supple.  Skin:  General: Skin is warm and dry.     Capillary Refill: Capillary refill takes less than 2 seconds.  Neurological:     General: No focal deficit present.     Mental Status: She is alert and oriented to person, place, and time.     Cranial Nerves: No cranial nerve deficit.  Psychiatric:        Mood and Affect: Mood normal.        Behavior: Behavior normal.        Thought Content: Thought content normal.        Judgment: Judgment normal.         Assessment And Plan:     1. Type 2 diabetes mellitus with both eyes affected by mild nonproliferative retinopathy without macular edema, without long-term current use of insulin (HCC) Comments: Continue current medications and f/u with Endocrinology. Given Gvoke due to having low blood sugars, if less than 60 she is to use GVOKE - BMP8+EGFR - CBC no Diff - Hemoglobin A1c - Glucagon (GVOKE HYPOPEN 2-PACK) 0.5 MG/0.1ML SOAJ; Inject 0.5 mg into the skin as needed.  Dispense: 0.2 mL; Refill: 3  2. Hypothyroidism, unspecified type Comments: Continue f/u with Endocrinology, will check thyroid levels as I do not have the levels at this time - TSH + free T4  3. BMI 27.0-27.9,adult  4. COVID-19 vaccination declined Declines covid 19 vaccine. Discussed risk of covid 57 and if she changes her mind about the vaccine to call the office. Education has been provided regarding the importance of this vaccine but patient still declined. Advised may receive this vaccine at local pharmacy or Health Dept.or vaccine clinic. Aware to provide a copy of the vaccination record if obtained from local pharmacy or Health Dept.  Encouraged to take multivitamin,  vitamin d, vitamin c and zinc to increase immune system. Aware can call office if would like to have vaccine here at office. Verbalized acceptance and understanding.  5. Chest congestion Comments: Will treat with Norel AD - samples given. If no improvement return call to office.  6. Acute cough Comments: She is improving, continues to have clear secretions. At this time will continue symptom management. If worsens return call to office    Patient was given opportunity to ask questions. Patient verbalized understanding of the plan and was able to repeat key elements of the plan. All questions were answered to their satisfaction.  [name], FNP   I, [name], FNP, have reviewed all documentation for this visit. The documentation on 10/16/22 for the exam, diagnosis, procedures, and orders are all accurate and complete.   IF YOU HAVE BEEN REFERRED TO A SPECIALIST, IT MAY TAKE 1-2 WEEKS TO SCHEDULE/PROCESS THE REFERRAL. IF YOU HAVE NOT HEARD FROM US/SPECIALIST IN TWO WEEKS, PLEASE GIVE [place] A CALL AT [phone].   THE PATIENT IS ENCOURAGED TO PRACTICE SOCIAL DISTANCING DUE TO THE COVID-19 PANDEMIC.

## 2022-10-17 LAB — BMP8+EGFR
BUN/Creatinine Ratio: 20 (ref 9–23)
BUN: 16 mg/dL (ref 6–24)
CO2: 21 mmol/L (ref 20–29)
Calcium: 10.1 mg/dL (ref 8.7–10.2)
Chloride: 102 mmol/L (ref 96–106)
Creatinine, Ser: 0.82 mg/dL (ref 0.57–1.00)
Glucose: 180 mg/dL — ABNORMAL HIGH (ref 70–99)
Potassium: 5.5 mmol/L — ABNORMAL HIGH (ref 3.5–5.2)
Sodium: 139 mmol/L (ref 134–144)
eGFR: 84 mL/min/{1.73_m2} (ref >59)

## 2022-10-17 LAB — CBC
Hematocrit: 43.7 % (ref 34.0–46.6)
Hemoglobin: 14.6 g/dL (ref 11.1–15.9)
MCH: 29.9 pg (ref 26.6–33.0)
MCHC: 33.4 g/dL (ref 31.5–35.7)
MCV: 89 fL (ref 79–97)
Platelets: 206 10*3/uL (ref 150–450)
RBC: 4.89 x10E6/uL (ref 3.77–5.28)
RDW: 13.1 % (ref 11.7–15.4)
WBC: 3.3 10*3/uL — ABNORMAL LOW (ref 3.4–10.8)

## 2022-10-17 LAB — HEMOGLOBIN A1C
Est. average glucose Bld gHb Est-mCnc: 169 mg/dL
Hgb A1c MFr Bld: 7.5 % — ABNORMAL HIGH (ref 4.8–5.6)

## 2022-10-17 LAB — TSH+FREE T4
Free T4: 1.06 ng/dL (ref 0.82–1.77)
TSH: 17.3 u[IU]/mL — ABNORMAL HIGH (ref 0.450–4.500)

## 2022-10-31 ENCOUNTER — Ambulatory Visit: Payer: No Typology Code available for payment source | Admitting: Obstetrics and Gynecology

## 2022-10-31 NOTE — Progress Notes (Deleted)
GYNECOLOGY  VISIT   HPI: 57 y.o.   Married Black or [place] American Not Hispanic or Latino  female   [phone] with Patient's last menstrual period was 09/12/2011.   here for f/u. She had a leep and laser of vaginal dysplasia in 9/23. She had extensive areas of decreased lugols uptake at the time of surgery, vaginal biopy returned with VAIN III. She has been using vaginal estrogen and healing very slowly. The plan has been to start 5FU as soon as she has healed.  She has been using the vaginal estrogen 3 x a week.   GYNECOLOGIC HISTORY: Patient's last menstrual period was 09/12/2011. Contraception:PMP Menopausal hormone therapy: No, just on vaginal estrogen        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[place]) 01/01/2021   Sepsis due to COVID-19 ([place]) 03/22/2020   Type 2 diabetes mellitus without complication, without long-term current use of insulin ([place]) 12/03/2018   Hypothyroidism 07/08/2018   Bilateral calf pain 06/08/2015   Chest pain 08/22/2014   Hyperlipidemia 07/13/2014   Family history of hypertrophic cardiomyopathy 07/13/2014   DOE (dyspnea on exertion) 07/13/2014   Diabetes ([place]) 11/01/2013   Adiposity 11/01/2013   Obesity 11/01/2013   Diabetes mellitus    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  colposcopy adequate normal 10/2015    Hyperlipidemia    Pneumonia 03/21/2020   Pulmonary embolism ([place]) 2012   STD (sexually transmitted disease) 1991   POS GC-TREATED   Thyroid disease    Type 2 Diabetes mellitus without complication ([place])     Past Surgical History:  Procedure Laterality Date   AORTA - BILATERAL FEMORAL ARTERY BYPASS GRAFT N/A 03/29/2020   Procedure: VENO VENOUS BYPASS;  Surgeon: Waynetta Sandy, MD;  Location: [facility];  Service: Vascular;  Laterality: N/A;   APPLICATION OF [place] N/A 03/29/2020   Procedure: [facility] MECHANICAL THROMBECTOMY;  Surgeon: Waynetta Sandy, MD;  Location: [facility];  Service: Vascular;  Laterality: N/A;   CESAREAN SECTION  123456   CO2 LASER APPLICATION N/A 0000000   Procedure: CO2 LASER APPLICATION of distal cervix and upper vagina;  Surgeon: Salvadore Dom, MD;  Location: [facility];  Service: Gynecology;  Laterality: N/A;   ENDOVASCULAR STENT  INSERTION N/A 03/29/2020   Procedure: Inferior Vena Cava WallStent Graft Insertion;  Surgeon: Waynetta Sandy, MD;  Location: [facility];  Service: Vascular;  Laterality: N/A;   INSERTION OF VENA CAVA FILTER Right 10/21/2010   LEEP N/A 07/02/2022   Procedure: LOOP ELECTROSURGICAL EXCISION PROCEDURE (LEEP) and colposcopy;  Surgeon: Salvadore Dom, MD;  [facility];  Service: Gynecology;  Laterality: N/A;   LOW ANTERIOR BOWEL RESECTION  01/31/2011   none     PORT-A-CATH REMOVAL Right 12/22/2012   Procedure: REMOVAL PORT-A-CATH;  Surgeon: Odis Hollingshead, MD;  Location: [facility];  Service: General;  Laterality: Right;   PORTACATH PLACEMENT  02/21/2011   tip in mid [id number]    [place] N/A 03/29/2020   Procedure: [place];  Surgeon: Waynetta Sandy, MD;  Location: [place] [facility];  Service: Vascular;  Laterality: N/A;    Current Outpatient Medications  Medication Sig Dispense Refill   Accu-Chek FastClix Lancets MISC twice a day for 30 days     apixaban (ELIQUIS) 5 MG TABS tablet TAKE 1 TABLET(5 MG) BY MOUTH TWICE DAILY 180 tablet 3   Blood Glucose Monitoring Suppl (ACCU-CHEK GUIDE) w/Device KIT as directed twice a day for 30 days     cholecalciferol (VITAMIN D) 400 UNITS TABS tablet Take 400 Units by mouth daily.      cyanocobalamin 500 MCG tablet Take 500 mcg by mouth daily. Vitamin b 12     estradiol (ESTRACE) 0.1 MG/GM vaginal cream S: insert 1 gram intravaginally qhs x 1 week, then insert 1 gram 2 x a week at hs. 42.5 g 1   Ferrous Sulfate (IRON) 325 (65 Fe) MG TABS      Glucagon (GVOKE HYPOPEN 2-PACK) 0.5 MG/0.1ML SOAJ Inject 0.5 mg into the skin as needed. 0.2 mL 3   glucose blood (ACCU-CHEK GUIDE) test strip as directed In Vitro twice a day for 30 days     Insulin Lispro (HUMALOG ) Inject 10 Units into the skin 3 (three) times daily with meals.      JARDIANCE 25 MG TABS tablet Take 25 mg by mouth daily.     levothyroxine (SYNTHROID, LEVOTHROID) 100 MCG tablet Take 100 mcg by mouth daily before breakfast.     Magnesium 500 MG TABS      MOUNJARO 5 MG/0.5ML Pen Inject 5 mg every week by subcutaneous route.     Multiple Vitamin (MULTIVITAMIN) tablet Take 1 tablet by mouth daily.     POTASSIUM PO Take by mouth.     rosuvastatin (CRESTOR) 10 MG tablet Take 10 mg by mouth daily.     Zinc 100 MG TABS Take 1 tablet by mouth daily.     No current facility-administered medications for this visit.     ALLERGIES: Metformin and related and Penicillins  Family History  Problem Relation Age of Onset   Heart disease Father    Lung cancer Mother    Brain cancer Mother    Diabetes Mother    Hypertension  Mother    Diabetes Brother    Kidney disease Brother    Cervical cancer Paternal Grandmother    Breast cancer Neg Hx     Social History   Socioeconomic History   Marital status: Married    Spouse name: Not on file   Number of children: 1   Years of education: Not on file   Highest education level: Not on file  Occupational History  Occupation: Engineer, structural  Tobacco Use   Smoking status: Never   Smokeless tobacco: Never  Substance and Sexual Activity   Alcohol use: Yes    Comment: very occ   Drug use: No   Sexual activity: Not Currently    Comment: no periods due to radiation  Other Topics Concern   Not on file  Social History Narrative   Not on file   Social Determinants of Health   Financial Resource Strain: Not on file  Food Insecurity: Not on file  Transportation Needs: Not on file  Physical Activity: Not on file  Stress: Not on file  Social Connections: Not on file  Intimate Partner Violence: Not on file    ROS  PHYSICAL EXAMINATION:    LMP 09/12/2011     General appearance: alert, cooperative and appears stated age Neck: no adenopathy, supple, symmetrical, trachea midline and thyroid {CHL AMB PHY EX THYROID NORM DEFAULT:[phone]::"normal to inspection and palpation"} Breasts: {Exam; breast:13139::"normal appearance, no masses or tenderness"} Abdomen: soft, non-tender; non distended, no masses,  no organomegaly  Pelvic: External genitalia:  no lesions              Urethra:  normal appearing urethra with no masses, tenderness or lesions              Bartholins and Skenes: normal                 Vagina: normal appearing vagina with normal color and discharge, no lesions              Cervix: {CHL AMB PHY EX CERVIX NORM DEFAULT:[phone]::"no lesions"}              Bimanual Exam:  Uterus:  {CHL AMB PHY EX UTERUS NORM DEFAULT:[id number]::"normal size, contour, position, consistency, mobility, non-tender"}              Adnexa: {CHL AMB PHY EX ADNEXA NO  MASS DEFAULT:[phone]::"no mass, fullness, tenderness"}              Rectovaginal: {yes no:[id number]}.  Confirms.              Anus:  normal sphincter tone, no lesions  Chaperone was present for exam.

## 2022-11-05 ENCOUNTER — Ambulatory Visit (INDEPENDENT_AMBULATORY_CARE_PROVIDER_SITE_OTHER): Payer: No Typology Code available for payment source | Admitting: Obstetrics and Gynecology

## 2022-11-05 ENCOUNTER — Encounter: Payer: Self-pay | Admitting: Obstetrics and Gynecology

## 2022-11-05 ENCOUNTER — Telehealth: Payer: Self-pay

## 2022-11-05 VITALS — BP 110/68 | HR 68 | Ht 71.0 in | Wt 196.0 lb

## 2022-11-05 DIAGNOSIS — N952 Postmenopausal atrophic vaginitis: Secondary | ICD-10-CM | POA: Diagnosis not present

## 2022-11-05 DIAGNOSIS — N893 Dysplasia of vagina, unspecified: Secondary | ICD-10-CM | POA: Diagnosis not present

## 2022-11-05 DIAGNOSIS — Z9889 Other specified postprocedural states: Secondary | ICD-10-CM | POA: Diagnosis not present

## 2022-11-05 DIAGNOSIS — N898 Other specified noninflammatory disorders of vagina: Secondary | ICD-10-CM

## 2022-11-05 LAB — WET PREP FOR TRICH, YEAST, CLUE

## 2022-11-05 NOTE — Progress Notes (Signed)
GYNECOLOGY  VISIT   HPI: 57 y.o.   Married Black or [place] American Not Hispanic or Latino  female   [phone] with Patient's last menstrual period was 09/12/2011.   here for f/u. She had a leep and laser of vaginal dysplasia in 9/23. At the time of surgery, she had extensive areas of decreased lugols uptake of the vagina. 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 She has been using vaginal estrogen 3 x a week to help with healing prior to starting 5FU, but has been healing very slowly.  She noticed some spotting last time she used the estrogen, hasn't noticed spotting prior to then. Not sexually active.  She denies vaginal d/c, itching, burning or irritation.   GYNECOLOGIC HISTORY: Patient's last menstrual period was 09/12/2011. Contraception:postmenopausal Menopausal hormone therapy: n/a        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[place]) 01/01/2021   Sepsis due to COVID-19 ([place]) 03/22/2020   Type 2 diabetes mellitus without complication, without long-term current use of insulin ([place]) 12/03/2018   Hypothyroidism 07/08/2018   Bilateral calf pain 06/08/2015   Chest pain 08/22/2014   Hyperlipidemia 07/13/2014   Family history of hypertrophic cardiomyopathy 07/13/2014   DOE (dyspnea on exertion) 07/13/2014   Diabetes ([place]) 11/01/2013   Adiposity 11/01/2013   Obesity 11/01/2013   Diabetes mellitus    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  colposcopy adequate normal 10/2015    Hyperlipidemia    Pneumonia 03/21/2020   Pulmonary embolism ([place]) 2012   STD (sexually transmitted disease) 1991   POS GC-TREATED   Thyroid disease    Type 2 Diabetes mellitus without complication ([place])     Past Surgical History:  Procedure Laterality Date   AORTA - BILATERAL FEMORAL ARTERY BYPASS GRAFT N/A 03/29/2020   Procedure: VENO VENOUS BYPASS;  Surgeon: Waynetta Sandy, MD;  Location: [facility];  Service: Vascular;  Laterality: N/A;   APPLICATION OF [place] N/A 03/29/2020   Procedure: [facility] MECHANICAL THROMBECTOMY;  Surgeon: Waynetta Sandy, MD;  Location: [facility];  Service: Vascular;  Laterality: N/A;   CESAREAN SECTION  96/28/3662   CO2 LASER APPLICATION N/A 9/47/6546   Procedure: CO2 LASER APPLICATION of distal cervix and upper vagina;  Surgeon: Salvadore Dom, MD;  Location: [facility];  Service: Gynecology;  Laterality: N/A;   ENDOVASCULAR STENT INSERTION N/A 03/29/2020   Procedure: Inferior Vena Cava WallStent Graft Insertion;  Surgeon: Waynetta Sandy, MD;  Location: [facility];  Service: Vascular;  Laterality: N/A;   INSERTION OF VENA CAVA FILTER Right 10/21/2010   LEEP N/A 07/02/2022   Procedure: LOOP ELECTROSURGICAL EXCISION PROCEDURE (LEEP) and colposcopy;  Surgeon: Salvadore Dom, MD;  Location: [facility];  Service: Gynecology;  Laterality: N/A;   LOW ANTERIOR BOWEL RESECTION  01/31/2011   none     PORT-A-CATH REMOVAL Right 12/22/2012   Procedure: REMOVAL PORT-A-CATH;   Surgeon: Odis Hollingshead, MD;  Location: [facility];  Service: General;  Laterality: Right;   PORTACATH PLACEMENT  02/21/2011   tip in mid [id number]   [place] N/A 03/29/2020   Procedure: [place];  Surgeon: Waynetta Sandy, MD;  Location: [facility];  Service: Vascular;  Laterality: N/A;    Current Outpatient Medications  Medication Sig Dispense Refill   Accu-Chek FastClix Lancets MISC twice a day for 30 days     apixaban (ELIQUIS) 5 MG TABS tablet TAKE 1 TABLET(5 MG) BY MOUTH TWICE DAILY 180 tablet 3   Blood Glucose Monitoring Suppl (ACCU-CHEK GUIDE) w/Device KIT as directed twice a day for 30 days     cholecalciferol (VITAMIN D) 400 UNITS TABS tablet Take 400 Units by mouth daily.      cyanocobalamin 500 MCG tablet Take 500 mcg by mouth daily. Vitamin b 12     estradiol (ESTRACE) 0.1 MG/GM vaginal cream S: insert 1 gram intravaginally qhs x 1 week, then insert 1 gram 2 x a week at hs. 42.5 g 1   Ferrous Sulfate (IRON) 325 (65 Fe) MG TABS      Glucagon (GVOKE HYPOPEN 2-PACK) 0.5 MG/0.1ML SOAJ Inject 0.5 mg into the skin as needed. 0.2 mL 3   glucose blood (ACCU-CHEK GUIDE) test strip as directed In Vitro twice a day for 30 days     Insulin Lispro (HUMALOG [place]) Inject 10 Units into the skin 3 (three) times daily with meals.      JARDIANCE 25 MG TABS tablet Take 25 mg by mouth daily.     levothyroxine (SYNTHROID, LEVOTHROID) 100 MCG tablet Take 100 mcg by mouth daily before breakfast.     Magnesium 500 MG TABS      MOUNJARO 5 MG/0.5ML Pen Inject 5 mg every week by subcutaneous route.     Multiple Vitamin (MULTIVITAMIN) tablet Take 1 tablet by mouth daily.     POTASSIUM PO Take by mouth.     rosuvastatin (CRESTOR) 10 MG tablet Take 10 mg by mouth daily.     Zinc 100 MG TABS Take 1 tablet by mouth daily.     No current facility-administered medications for this visit.     ALLERGIES: Metformin and related  and Penicillins  Family History  Problem Relation Age of Onset   Heart disease Father    Lung cancer Mother    Brain cancer Mother    Diabetes Mother    Hypertension Mother    Diabetes Brother    Kidney disease Brother    Cervical cancer Paternal Grandmother    Breast cancer Neg Hx     Social History   Socioeconomic History   Marital status: Married    Spouse name: Not on file   Number of children: 1  Years of education: Not on file   Highest education level: Not on file  Occupational History   Occupation: Engineer, structural  Tobacco Use   Smoking status: Never   Smokeless tobacco: Never  Substance and Sexual Activity   Alcohol use: Yes    Comment: very occ   Drug use: No   Sexual activity: Not Currently    Comment: no periods due to radiation  Other Topics Concern   Not on file  Social History Narrative   Not on file   Social Determinants of Health   Financial Resource Strain: Not on file  Food Insecurity: Not on file  Transportation Needs: Not on file  Physical Activity: Not on file  Stress: Not on file  Social Connections: Not on file  Intimate Partner Violence: Not on file    Review of Systems  All other systems reviewed and are negative.   PHYSICAL EXAMINATION:    BP 110/68 (BP Location: Right Arm, Patient Position: Sitting, Cuff Size: Normal)   Pulse 68   Ht '5\' 11"'$  (1.803 m)   Wt 196 lb (88.9 kg)   LMP 09/12/2011   BMI 27.34 kg/m     General appearance: alert, cooperative and appears stated age  Pelvic: External genitalia:  no lesions              Urethra:  normal appearing urethra with no masses, tenderness or lesions              Bartholins and Skenes: normal                 Vagina: atrophic, erythematous and friable on the right vaginal apex. Yellow d/c noted. Friable region treated with silver nitrate              Cervix:  flush with the vagina.               Chaperone was present for exam.  1. History of loop electrical excision  procedure (LEEP) S/P loop cone and laser of vaginal dysplasia. 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 Cervix flush with vagina.  2. Vaginal dysplasia VAIN III, have been waiting to start treatment with 5 FU, but she has healed very slowly from the leep/laser of vaginal dysplasia from 9/23. Still not completely healed, even with 3 x a week estrogen treatment. -Will refer to Dr Berline Lopes in [place] for another opinion.   3. Vaginal atrophy Continue vaginal estrogen   4. Vaginal discharge - WET PREP FOR TRICH, YEAST, CLUE: +WBC otherwise negative.

## 2022-11-05 NOTE — Telephone Encounter (Signed)
[name] Dom, MD  [facility] Triage Please place a referral to Dr Berline Lopes for vaginal dysplasia. Thanks,

## 2022-11-05 NOTE — Telephone Encounter (Signed)
Referral placed in Epic.

## 2022-11-07 ENCOUNTER — Telehealth: Payer: Self-pay | Admitting: *Deleted

## 2022-11-07 NOTE — Telephone Encounter (Signed)
LMOM for the patient to call the office back. Patient needs to be scheduled for a new patient appt

## 2022-11-11 NOTE — Telephone Encounter (Signed)
Spoke with the patient regarding the referral to GYN oncology. Patient scheduled as new patient with Dr Ernestina Patches on 2/5 at 9 am.  Patient given an arrival time of 9 am.  Explained to the patient the the doctor will perform a pelvic exam at this visit. Patient given the policy that no visitors under the 16 yrs are allowed in the [facility]. Patient given the address/phone number for the clinic and that the center offers free valet service. Patient aware of the new mask mandate.

## 2022-11-13 NOTE — Telephone Encounter (Signed)
GYN/ONC appt scheduled 11/25/22 at 9:00am

## 2022-11-21 ENCOUNTER — Encounter: Payer: Self-pay | Admitting: Psychiatry

## 2022-11-25 ENCOUNTER — Other Ambulatory Visit: Payer: Self-pay

## 2022-11-25 ENCOUNTER — Inpatient Hospital Stay: Payer: No Typology Code available for payment source | Admitting: Gynecologic Oncology

## 2022-11-25 ENCOUNTER — Inpatient Hospital Stay: Payer: No Typology Code available for payment source | Attending: Psychiatry | Admitting: Psychiatry

## 2022-11-25 ENCOUNTER — Encounter: Payer: Self-pay | Admitting: Psychiatry

## 2022-11-25 VITALS — BP 122/88 | HR 57 | Resp 16 | Ht 71.0 in | Wt 187.0 lb

## 2022-11-25 DIAGNOSIS — E119 Type 2 diabetes mellitus without complications: Secondary | ICD-10-CM | POA: Insufficient documentation

## 2022-11-25 DIAGNOSIS — N893 Dysplasia of vagina, unspecified: Secondary | ICD-10-CM

## 2022-11-25 DIAGNOSIS — Z923 Personal history of irradiation: Secondary | ICD-10-CM | POA: Diagnosis not present

## 2022-11-25 DIAGNOSIS — Z794 Long term (current) use of insulin: Secondary | ICD-10-CM | POA: Insufficient documentation

## 2022-11-25 DIAGNOSIS — Z79899 Other long term (current) drug therapy: Secondary | ICD-10-CM | POA: Diagnosis not present

## 2022-11-25 DIAGNOSIS — Z7901 Long term (current) use of anticoagulants: Secondary | ICD-10-CM | POA: Diagnosis not present

## 2022-11-25 DIAGNOSIS — Z8616 Personal history of COVID-19: Secondary | ICD-10-CM | POA: Diagnosis not present

## 2022-11-25 DIAGNOSIS — D072 Carcinoma in situ of vagina: Secondary | ICD-10-CM | POA: Diagnosis not present

## 2022-11-25 DIAGNOSIS — E079 Disorder of thyroid, unspecified: Secondary | ICD-10-CM | POA: Diagnosis not present

## 2022-11-25 DIAGNOSIS — Z86718 Personal history of other venous thrombosis and embolism: Secondary | ICD-10-CM | POA: Diagnosis not present

## 2022-11-25 DIAGNOSIS — A630 Anogenital (venereal) warts: Secondary | ICD-10-CM | POA: Insufficient documentation

## 2022-11-25 DIAGNOSIS — Z79890 Hormone replacement therapy (postmenopausal): Secondary | ICD-10-CM | POA: Diagnosis not present

## 2022-11-25 DIAGNOSIS — Z86711 Personal history of pulmonary embolism: Secondary | ICD-10-CM | POA: Insufficient documentation

## 2022-11-25 DIAGNOSIS — Z85048 Personal history of other malignant neoplasm of rectum, rectosigmoid junction, and anus: Secondary | ICD-10-CM | POA: Insufficient documentation

## 2022-11-25 DIAGNOSIS — R159 Full incontinence of feces: Secondary | ICD-10-CM | POA: Diagnosis not present

## 2022-11-25 DIAGNOSIS — D069 Carcinoma in situ of cervix, unspecified: Secondary | ICD-10-CM | POA: Diagnosis not present

## 2022-11-25 DIAGNOSIS — N952 Postmenopausal atrophic vaginitis: Secondary | ICD-10-CM | POA: Insufficient documentation

## 2022-11-25 DIAGNOSIS — N72 Inflammatory disease of cervix uteri: Secondary | ICD-10-CM

## 2022-11-25 MED ORDER — DOXYCYCLINE HYCLATE 100 MG PO TABS: 100.0000 mg | ORAL_TABLET | Freq: Two times a day (BID) | ORAL | 0 refills | Status: AC

## 2022-11-25 MED ORDER — METRONIDAZOLE 500 MG PO TABS: 500.0000 mg | ORAL_TABLET | Freq: Two times a day (BID) | ORAL | 0 refills | Status: AC

## 2022-11-25 NOTE — H&P (View-Only) (Signed)
GYNECOLOGIC ONCOLOGY NEW PATIENT CONSULTATION  Date of Service: 11/25/2022 Referring Provider: Sumner Boast, MD   ASSESSMENT AND PLAN: [name] is a 57 y.o. 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  Pathology and exam findings reviewed in detail. 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 Reviewed the role HPV in these changes. 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 HIV checked in 03/2020 and negative.  Given exam is limited by cervix flush with vagina and apparent agglutination, narrowing of vaginal apex, recommend EUA in OR and possible biopsy. In the meantime, given concern for possible abnormal discharge on recent exam and pt with known fecal contamination due to fecal incontinence from prior rectal cancer treatment, will trial a course of antibiotics. Can also continue in meantime with vaginal estrogen treatment.  Patient was consented for: pelvic exam under anesthesia, possible biopsies of cervix and vagina, possible ultrasound guidance on 12/17/22.  Reviewed that I will have ultrasound available in case I am able to probe the cervical canal to verify anatomy.  The risks of surgery were discussed in detail and she understands these to including but not limited to bleeding requiring a blood transfusion, infection, injury to adjacent organs (including but not limited to the bowels, bladder, ureters, nerves, blood vessels), thromboembolic events, unforseen complication, and possible need for re-exploration.  If the patient experiences any of these events, she understands that her hospitalization or recovery may be prolonged and that she may need to take additional medications for a prolonged  period. The patient will receive DVT and antibiotic prophylaxis as indicated. She voiced a clear understanding. She had the opportunity to ask questions and informed consent was obtained today. She wishes to proceed.  Will plan to hold eliquis one day prior and on day of procedure. She does not require preoperative clearance. Her METs are >4.  All preoperative instructions were reviewed. Postoperative expectations were also reviewed.   A copy of this note was sent to the patient's referring provider.  Bernadene Bell, MD Gynecologic Oncology   Medical Decision Making I personally spent  TOTAL 50 minutes face-to-face and non-face-to-face in the care of this patient, which includes all pre, intra, and post visit time on the date of service.   ------------  CC: Cervical, vaginal dysplasia  HISTORY OF PRESENT ILLNESS:  [name] is a 9 y.o. woman who is seen in consultation at the request of Sumner Boast, MD for evaluation of high grade cervical and vaginal dysplasia.  Patient presented to her OB/GYN on 11/05/2022.  Since her LEEP in September, patient had been using vaginal estrogen to help with healing prior to starting 5-FU, but healing has been slow.  On exam she was noted to have atrophic vagina with erythematous and friable area of the right vaginal apex with yellow discharge noted friable region was treated with silver nitrate.  Wet prep was positive for white blood cells and otherwise negative.  Today, pt reports that she has been using the estrogen crease for about 2 months, initially nightly and more recently 3x/week. She reports some vaginal bleeding when she inserts the cream. Denies abnormal vaginal discharge. Reports that she has not been sexually active in recent years. Does note fecal incontinence at baseline  since treatment for her rectal cancer, but this has been slightly more frequent recently with occasional stool getting into her vagina.  Of note, pt has a history of  rectal cancer that was treated in 2012 with a low anterior resection, radiation, 5-FU and FOLFOX.  Treatment was completed in the September of 2012. She also underwent an angio VAC mechanical thrombectomy and stenting of her IVC after removal of IVC filter due to occluded IVC filter with bilateral lower extremity DV in June 2021.  TREATMENT HISTORY: - 05/07/2022 - Pap: ASCUS-H, HPV high risk detected - 05/28/2022 - Colposcopy: 10:00 biopsy HSIL, ECC HSIL - 07/02/2022 - LEEP and laser of cervical and vaginal dysplasia: Decreased Lugol's uptake of the cervix in the upper vagina.  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  Left vaginal sidewall biopsy VAIN 3, right vaginal sidewall biopsy VAIN3  PAST MEDICAL HISTORY: Past Medical History:  Diagnosis Date   ASCUS 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  colposcopy adequate normal 10/2015    Hyperlipidemia    Pneumonia 03/21/2020   Pulmonary embolism ([place]) 2012   STD (sexually transmitted disease) 1991   POS GC-TREATED   Thyroid disease    Type 2 Diabetes mellitus without complication ([place])     PAST SURGICAL HISTORY: Past Surgical History:  Procedure Laterality Date   AORTA - BILATERAL FEMORAL ARTERY BYPASS GRAFT N/A 03/29/2020   Procedure: VENO VENOUS BYPASS;  Surgeon:  Waynetta Sandy, MD;  Location: [facility];  Service: Vascular;  Laterality: N/A;   APPLICATION OF [place] N/A 03/29/2020   Procedure: [place];  Surgeon: Waynetta Sandy, MD;  Location: [place];  Service: Vascular;  Laterality: N/A;   CESAREAN SECTION  123456   CO2 LASER APPLICATION N/A 0000000   Procedure: CO2 LASER APPLICATION of distal cervix and upper vagina;  Surgeon: Salvadore Dom, MD;  Location: [name];  Service: Gynecology;  Laterality: N/A;   ENDOVASCULAR STENT INSERTION N/A 03/29/2020   Procedure: Inferior Vena Cava WallStent Graft Insertion;  Surgeon: Waynetta Sandy, MD;  Location: [place];  Service: Vascular;  Laterality: N/A;   INSERTION OF VENA CAVA FILTER Right 10/21/2010   LEEP N/A 07/02/2022   Procedure: LOOP ELECTROSURGICAL EXCISION PROCEDURE (LEEP) and colposcopy;  Surgeon: Salvadore Dom, MD;  [facility];  Service: Gynecology;  Laterality: N/A;   LOW ANTERIOR BOWEL RESECTION  01/31/2011   none     PORT-A-CATH REMOVAL Right 12/22/2012   Procedure: REMOVAL PORT-A-CATH;  Surgeon: Odis Hollingshead, MD;  Location: [place];  Service: General;  Laterality: Right;   PORTACATH PLACEMENT  02/21/2011   tip in mid [id number]   [place] N/A 03/29/2020   Procedure: [place];  Surgeon: Waynetta Sandy, MD;  Location:  [facility];  Service: Vascular;  Laterality: N/A;    OB/GYN HISTORY: OB History  Gravida Para Term Preterm AB Living  '6 3 3   3 3  '$ SAB IAB Ectopic Multiple Live Births          3    # Outcome Date GA Lbr Len/2nd Weight Sex Delivery Anes PTL Lv  6 AB           5 AB           4 AB           3 Term      CS-Unspec   LIV  2 Term      Vag-Spont   LIV  1 Term      Vag-Spont   LIV      Age at menarche: 48 Age at menopause: 89 Hx of HRT: no Hx of STI: yes (HPV, prior  gonorrhea or clamydia in 89s, treated) Last pap: see above History of abnormal pap smears: see above, pt also reports possible freezing of her cervix ~5 years ago  SCREENING STUDIES:  Last mammogram: 2020 Last colonoscopy: 2023  MEDICATIONS:  Current Outpatient Medications:    Accu-Chek FastClix Lancets MISC, twice a day for 30 days, Disp: , Rfl:    apixaban (ELIQUIS) 5 MG TABS tablet, TAKE 1 TABLET(5 MG) BY MOUTH TWICE DAILY, Disp: 180 tablet, Rfl: 3   Blood Glucose Monitoring Suppl (ACCU-CHEK GUIDE) w/Device KIT, as directed twice a day for 30 days, Disp: , Rfl:    cholecalciferol (VITAMIN D) 400 UNITS TABS tablet, Take 400 Units by mouth daily. , Disp: , Rfl:    cyanocobalamin 500 MCG tablet, Take 500 mcg by mouth daily. Vitamin b 12, Disp: , Rfl:    doxycycline (VIBRA-TABS) 100 MG tablet, Take 1 tablet (100 mg total) by mouth 2 (two) times daily for 7 days., Disp: 14 tablet, Rfl: 0   estradiol (ESTRACE) 0.1 MG/GM vaginal cream, S: insert 1 gram intravaginally qhs x 1 week, then insert 1 gram 2 x a week at hs., Disp: 42.5 g, Rfl: 1   Ferrous Sulfate (IRON) 325 (65 Fe) MG TABS, , Disp: , Rfl:    Glucagon (GVOKE HYPOPEN 2-PACK) 0.5 MG/0.1ML SOAJ, Inject 0.5 mg into the skin as needed., Disp: 0.2 mL, Rfl: 3   glucose blood (ACCU-CHEK GUIDE) test strip, as directed In Vitro twice a day for 30 days, Disp: , Rfl:    Insulin Lispro (HUMALOG [place]), Inject 10 Units into the skin 3 (three) times daily with meals. , Disp: , Rfl:    JARDIANCE 25 MG TABS tablet, Take 25 mg by mouth daily., Disp: , Rfl:    levothyroxine (SYNTHROID, LEVOTHROID) 100 MCG tablet, Take 100 mcg by mouth daily before breakfast., Disp: , Rfl:    Magnesium 500 MG TABS, , Disp: , Rfl:    metroNIDAZOLE (FLAGYL) 500 MG tablet, Take 1 tablet (500 mg total) by mouth 2 (two) times daily for 7 days., Disp: 14 tablet, Rfl: 0   MOUNJARO 5 MG/0.5ML Pen, Inject 5 mg every week by subcutaneous route., Disp: , Rfl:    Multiple Vitamin  (MULTIVITAMIN) tablet, Take 1 tablet by mouth daily., Disp: , Rfl:    POTASSIUM PO, Take by mouth., Disp: , Rfl:    rosuvastatin (CRESTOR) 10 MG tablet, Take 10 mg by mouth daily., Disp: , Rfl:    Zinc 100 MG TABS, Take 1 tablet by mouth daily., Disp: , Rfl:   ALLERGIES: Allergies  Allergen Reactions   Metformin And Related Other (See Comments)    Chest pain   Penicillins Hives    FAMILY HISTORY: Family History  Problem Relation Age of Onset   Lung cancer Mother    Diabetes Mother    Hypertension Mother    Heart disease Father    Diabetes Brother    Kidney disease Brother    Cervical cancer Paternal Grandmother    Breast cancer Neg Hx    Colon cancer Neg Hx    Ovarian cancer Neg Hx    Endometrial cancer Neg Hx    Pancreatic cancer Neg Hx    Prostate cancer Neg Hx     SOCIAL HISTORY: Social History   Socioeconomic History   Marital status: Married    Spouse name: Not on file   Number of children: 1   Years of education: Not on file   Highest education level: Not on file  Occupational History   Occupation: Engineer, structural  Tobacco Use   Smoking status: Never   Smokeless tobacco: Never  Substance and Sexual Activity   Alcohol use: Yes    Comment: very occ   Drug use: No   Sexual activity: Not Currently    Comment: no periods due to radiation  Other Topics Concern   Not on file  Social History Narrative   Not on file   Social Determinants of Health   Financial Resource Strain: Not on file  Food Insecurity: No Food Insecurity (11/21/2022)   Hunger Vital Sign    Worried About Running Out of Food in the Last Year: Never true    Ran Out of Food in the Last Year: Never true  Transportation Needs: No Transportation Needs (11/21/2022)   PRAPARE - Hydrologist (Medical): No    Lack of Transportation (Non-Medical): No  Physical Activity: Not on file  Stress: Not on file  Social Connections: Moderately Integrated (11/21/2022)   Social  Connection and Isolation Panel [NHANES]    Frequency of Communication with Friends and Family: More than three times a week    Frequency of Social Gatherings with Friends and Family: Not on file    Attends Religious Services: More than 4 times per year    Active Member of [employer] or Organizations: No    Attends Archivist Meetings: Not on file    Marital Status: Married  Intimate Partner Violence: Not At Risk (11/21/2022)   Humiliation, Afraid, Rape, and Kick questionnaire    Fear of Current or Ex-Partner: No    Emotionally Abused: No    Physically Abused: No    Sexually Abused: No    REVIEW OF SYSTEMS: New patient intake form was reviewed.  Complete 10-system review is negative except for the following: none  PHYSICAL EXAM: BP 122/88 (BP Location: Left Arm, Patient Position: Sitting)   Pulse (!) 57   Resp 16   Ht '5\' 11"'$  (1.803 m)   Wt 187 lb (84.8 kg)   LMP 09/12/2011   SpO2 100%   BMI 26.08 kg/m  Constitutional: No acute distress. Neuro/Psych: Alert, oriented.  Head and Neck: Normocephalic, atraumatic. Neck symmetric without masses. Sclera anicteric.  Respiratory: Normal work of breathing. Clear to auscultation bilaterally. Cardiovascular: Regular rate and rhythm, no murmurs, rubs, or gallops. Abdomen: Normoactive bowel sounds. Soft, non-distended, non-tender to palpation. No masses or hepatosplenomegaly appreciated.  Extremities: Grossly normal range of motion. Warm, well perfused. No edema bilaterally. Skin: No rashes or lesions. Lymphatic: No  cervical, supraclavicular, or inguinal adenopathy. Genitourinary: External genitalia without lesions. Urethral meatus without lesions or prolapse. On speculum exam, cervix not clearly visualized, likely due to flush with vaginal wall. Attempt to probe possible cervical os with os finder, unsuccessful. Vaginal apex, particularly at right apical fornix with erythema and friability. Hyperglandularity at this area. Bimanual exam  reveals cervix flush with vagina. Narrowing of apex of vagina with likely agglutination. Rectovaginal exam confirms the above findings and reveals normal sphincter tone and no masses or nodularity. Exam chaperoned by Joylene John, NP   LABORATORY AND RADIOLOGIC DATA: Outside medical records were reviewed to synthesize the above history, along with the history and physical obtained during the visit.  Outside laboratory, pathology reports were reviewed, with pertinent results below.    WBC  Date Value Ref Range Status  10/16/2022 3.3 (L) 3.4 - 10.8 x10E3/uL Final  12/27/2020 3.9 (L) 4.0 - 10.5 K/uL Final   WBC Count  Date Value Ref Range Status  08/09/2022 3.9 (L) 4.0 - 10.5 K/uL Final   Hemoglobin  Date Value Ref Range Status  10/16/2022 14.6 11.1 - 15.9 g/dL Final   HGB  Date Value Ref Range Status  12/17/2013 13.3 11.6 - 15.9 g/dL Final   HCT  Date Value Ref Range Status  12/17/2013 40.6 34.8 - 46.6 % Final   Hematocrit  Date Value Ref Range Status  10/16/2022 43.7 34.0 - 46.6 % Final   Platelets  Date Value Ref Range Status  10/16/2022 206 150 - 450 x10E3/uL Final   LDH  Date Value Ref Range Status  08/20/2011 167 94 - 250 U/L Final   Magnesium  Date Value Ref Range Status  04/10/2020 1.7 1.7 - 2.4 mg/dL Final    Comment:    Performed at [facility], [place] [address], [place], [place] [address]   Creatinine  Date Value Ref Range Status  12/17/2013 0.8 0.6 - 1.1 mg/dL Final   Creat  Date Value Ref Range Status  10/27/2015 0.96 0.50 - 1.10 mg/dL Final   Creatinine, Ser  Date Value Ref Range Status  10/16/2022 0.82 0.57 - 1.00 mg/dL Final   AST  Date Value Ref Range Status  12/11/2021 13 0 - 40 IU/L Final  12/17/2013 15 5 - 34 U/L Final   ALT  Date Value Ref Range Status  12/11/2021 16 0 - 32 IU/L Final  12/17/2013 19 0 - 55 U/L Final   CEA  Date Value Ref Range Status  02/12/2016 <0.5 ng/mL Final    Comment:    Reference Range:  Non-Smoker:   <2.5                   Smoker:      <5.0   This test was performed using the Siemens Chemiluminescent method. Values obtained from different assay methods cannot be used interchangeably. CEA levels, regardless of value, should not be interpreted as absolute evidence of the presence or absence of disease.     07/24/2015 <0.5 0.0 - 5.0 ng/mL Final  01/17/2015 <0.5 0.0 - 5.0 ng/mL Final  07/19/2014 <0.5 0.0 - 5.0 ng/mL Final  12/17/2013 <0.5 0.0 - 5.0 ng/mL Final  06/07/2013 <0.5 0.0 - 5.0 ng/mL Final  12/07/2012 <0.5 0.0 - 5.0 ng/mL Final  06/01/2012 <0.5 0.0 - 5.0 ng/mL Final  01/07/2012 <0.5 0.0 - 5.0 ng/mL Final  05/27/2011 0.6 0.0 - 5.0 ng/mL Final  02/15/2011 1.1 0.0 - 5.0 ng/mL Final   Surgical pathology (07/02/22): FINAL  MICROSCOPIC DIAGNOSIS:   A. ENDOCERVIX, CURETTAGE:  Very scant mucus with admixed endocervical cells and minute fragment of  metaplastic squamous epithelium   B. ENDOCERVIX, LEEP:  Benign endocervix with squamous metaplasia  Negative for dysplasia and diagnostic viral change (see comment)   C. 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  LEFT VAGINAL SIDE WALL, BIOPSY:  Severe squamous dysplasia (HSIL, VAIN 3)   E.  RIGHT VAGINAL SIDE WALL, BIOPSY:  Severe squamous dysplasia (HSIL, VAIN 3)

## 2022-11-25 NOTE — Progress Notes (Unsigned)
GYNECOLOGIC ONCOLOGY NEW PATIENT CONSULTATION  Date of Service: 11/25/2022 Referring Provider: Jill Jertson, MD   ASSESSMENT AND PLAN: [name] is a 56 y.o. 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  Pathology and exam findings reviewed in detail. 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 Reviewed the role HPV in these changes. 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 HIV checked in 03/2020 and negative.  Given exam is limited by cervix flush with vagina and apparent agglutination, narrowing of vaginal apex, recommend EUA in OR and possible biopsy. In the meantime, given concern for possible abnormal discharge on recent exam and pt with known fecal contamination due to fecal incontinence from prior rectal cancer treatment, will trial a course of antibiotics. Can also continue in meantime with vaginal estrogen treatment.  Patient was consented for: pelvic exam under anesthesia, possible biopsies of cervix and vagina, possible ultrasound guidance on 12/17/22.  Reviewed that I will have ultrasound available in case I am able to probe the cervical canal to verify anatomy.  The risks of surgery were discussed in detail and she understands these to including but not limited to bleeding requiring a blood transfusion, infection, injury to adjacent organs (including but not limited to the bowels, bladder, ureters, nerves, blood vessels), thromboembolic events, unforseen complication, and possible need for re-exploration.  If the patient experiences any of these events, she understands that her hospitalization or recovery may be prolonged and that she may need to take additional medications for a prolonged  period. The patient will receive DVT and antibiotic prophylaxis as indicated. She voiced a clear understanding. She had the opportunity to ask questions and informed consent was obtained today. She wishes to proceed.  Will plan to hold eliquis one day prior and on day of procedure. She does not require preoperative clearance. Her METs are >4.  All preoperative instructions were reviewed. Postoperative expectations were also reviewed.   A copy of this note was sent to the patient's referring provider.  Collan Schoenfeld, MD Gynecologic Oncology   Medical Decision Making I personally spent  TOTAL 50 minutes face-to-face and non-face-to-face in the care of this patient, which includes all pre, intra, and post visit time on the date of service.   ------------  CC: Cervical, vaginal dysplasia  HISTORY OF PRESENT ILLNESS:  [name] is a 56 y.o. woman who is seen in consultation at the request of Jill Jertson, MD for evaluation of high grade cervical and vaginal dysplasia.  Patient presented to her OB/GYN on 11/05/2022.  Since her LEEP in September, patient had been using vaginal estrogen to help with healing prior to starting 5-FU, but healing has been slow.  On exam she was noted to have atrophic vagina with erythematous and friable area of the right vaginal apex with yellow discharge noted friable region was treated with silver nitrate.  Wet prep was positive for white blood cells and otherwise negative.  Today, pt reports that she has been using the estrogen crease for about 2 months, initially nightly and more recently 3x/week. She reports some vaginal bleeding when she inserts the cream. Denies abnormal vaginal discharge. Reports that she has not been sexually active in recent years. Does note fecal incontinence at baseline   since treatment for her rectal cancer, but this has been slightly more frequent recently with occasional stool getting into her vagina.  Of note, pt has a history of  rectal cancer that was treated in 2012 with a low anterior resection, radiation, 5-FU and FOLFOX.  Treatment was completed in the September of 2012. She also underwent an angio VAC mechanical thrombectomy and stenting of her IVC after removal of IVC filter due to occluded IVC filter with bilateral lower extremity DV in June 2021.  TREATMENT HISTORY: - 05/07/2022 - Pap: ASCUS-H, HPV high risk detected - 05/28/2022 - Colposcopy: 10:00 biopsy HSIL, ECC HSIL - 07/02/2022 - LEEP and laser of cervical and vaginal dysplasia: Decreased Lugol's uptake of the cervix in the upper vagina.  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  Left vaginal sidewall biopsy VAIN 3, right vaginal sidewall biopsy VAIN3  PAST MEDICAL HISTORY: Past Medical History:  Diagnosis Date   ASCUS 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  colposcopy adequate normal 10/2015    Hyperlipidemia    Pneumonia 03/21/2020   Pulmonary embolism (HCC) 2012   STD (sexually transmitted disease) 1991   POS GC-TREATED   Thyroid disease    Type 2 Diabetes mellitus without complication (HCC)     PAST SURGICAL HISTORY: Past Surgical History:  Procedure Laterality Date   AORTA - BILATERAL FEMORAL ARTERY BYPASS GRAFT N/A 03/29/2020   Procedure: VENO VENOUS BYPASS;  Surgeon:  Cain, Brandon Christopher, MD;  Location: [facility];  Service: Vascular;  Laterality: N/A;   APPLICATION OF ANGIOVAC N/A 03/29/2020   Procedure: ANGIOVAC MECHANICAL THROMBECTOMY;  Surgeon: Cain, Brandon Christopher, MD;  Location: [facility] OR;  Service: Vascular;  Laterality: N/A;   CESAREAN SECTION  10/22/2003   CO2 LASER APPLICATION N/A 07/02/2022   Procedure: CO2 LASER APPLICATION of distal cervix and upper vagina;  Surgeon: Jertson, Jill Evelyn, MD;  Location: [facility];  Service: Gynecology;  Laterality: N/A;   ENDOVASCULAR STENT INSERTION N/A 03/29/2020   Procedure: Inferior Vena Cava WallStent Graft Insertion;  Surgeon: Cain, Brandon Christopher, MD;  Location: [facility] OR;  Service: Vascular;  Laterality: N/A;   INSERTION OF VENA CAVA FILTER Right 10/21/2010   LEEP N/A 07/02/2022   Procedure: LOOP ELECTROSURGICAL EXCISION PROCEDURE (LEEP) and colposcopy;  Surgeon: Jertson, Jill Evelyn, MD;  Location: [facility];  Service: Gynecology;  Laterality: N/A;   LOW ANTERIOR BOWEL RESECTION  01/31/2011   none     PORT-A-CATH REMOVAL Right 12/22/2012   Procedure: REMOVAL PORT-A-CATH;  Surgeon: Todd J Rosenbower, MD;  Location: [facility];  Service: General;  Laterality: Right;   PORTACATH PLACEMENT  02/21/2011   tip in mid [id number]-rosenbower   THROMBECTOMY ILIAC ARTERY N/A 03/29/2020   Procedure: THROMBECTOMY ILIAC ARTERY MECHANICAL THROMBECTOMY;  Surgeon: Cain, Brandon Christopher, MD;  Location:   [facility] OR;  Service: Vascular;  Laterality: N/A;    OB/GYN HISTORY: OB History  Gravida Para Term Preterm AB Living  6 3 3   3 3  SAB IAB Ectopic Multiple Live Births          3    # Outcome Date GA Lbr Len/2nd Weight Sex Delivery Anes PTL Lv  6 AB           5 AB           4 AB           3 Term      CS-Unspec   LIV  2 Term      Vag-Spont   LIV  1 Term      Vag-Spont   LIV      Age at menarche: 15 Age at menopause: 44 Hx of HRT: no Hx of STI: yes (HPV, prior  gonorrhea or clamydia in 20s, treated) Last pap: see above History of abnormal pap smears: see above, pt also reports possible freezing of her cervix ~5 years ago  SCREENING STUDIES:  Last mammogram: 2020 Last colonoscopy: 2023  MEDICATIONS:  Current Outpatient Medications:    Accu-Chek FastClix Lancets MISC, twice a day for 30 days, Disp: , Rfl:    apixaban (ELIQUIS) 5 MG TABS tablet, TAKE 1 TABLET(5 MG) BY MOUTH TWICE DAILY, Disp: 180 tablet, Rfl: 3   Blood Glucose Monitoring Suppl (ACCU-CHEK GUIDE) w/Device KIT, as directed twice a day for 30 days, Disp: , Rfl:    cholecalciferol (VITAMIN D) 400 UNITS TABS tablet, Take 400 Units by mouth daily. , Disp: , Rfl:    cyanocobalamin 500 MCG tablet, Take 500 mcg by mouth daily. Vitamin b 12, Disp: , Rfl:    doxycycline (VIBRA-TABS) 100 MG tablet, Take 1 tablet (100 mg total) by mouth 2 (two) times daily for 7 days., Disp: 14 tablet, Rfl: 0   estradiol (ESTRACE) 0.1 MG/GM vaginal cream, S: insert 1 gram intravaginally qhs x 1 week, then insert 1 gram 2 x a week at hs., Disp: 42.5 g, Rfl: 1   Ferrous Sulfate (IRON) 325 (65 Fe) MG TABS, , Disp: , Rfl:    Glucagon (GVOKE HYPOPEN 2-PACK) 0.5 MG/0.1ML SOAJ, Inject 0.5 mg into the skin as needed., Disp: 0.2 mL, Rfl: 3   glucose blood (ACCU-CHEK GUIDE) test strip, as directed In Vitro twice a day for 30 days, Disp: , Rfl:    Insulin Lispro (HUMALOG [place]), Inject 10 Units into the skin 3 (three) times daily with meals. , Disp: , Rfl:    JARDIANCE 25 MG TABS tablet, Take 25 mg by mouth daily., Disp: , Rfl:    levothyroxine (SYNTHROID, LEVOTHROID) 100 MCG tablet, Take 100 mcg by mouth daily before breakfast., Disp: , Rfl:    Magnesium 500 MG TABS, , Disp: , Rfl:    metroNIDAZOLE (FLAGYL) 500 MG tablet, Take 1 tablet (500 mg total) by mouth 2 (two) times daily for 7 days., Disp: 14 tablet, Rfl: 0   MOUNJARO 5 MG/0.5ML Pen, Inject 5 mg every week by subcutaneous route., Disp: , Rfl:    Multiple Vitamin  (MULTIVITAMIN) tablet, Take 1 tablet by mouth daily., Disp: , Rfl:    POTASSIUM PO, Take by mouth., Disp: , Rfl:    rosuvastatin (CRESTOR) 10 MG tablet, Take 10 mg by mouth daily., Disp: , Rfl:    Zinc 100 MG TABS, Take 1 tablet by mouth daily., Disp: , Rfl:   ALLERGIES: Allergies    Allergen Reactions   Metformin And Related Other (See Comments)    Chest pain   Penicillins Hives    FAMILY HISTORY: Family History  Problem Relation Age of Onset   Lung cancer Mother    Diabetes Mother    Hypertension Mother    Heart disease Father    Diabetes Brother    Kidney disease Brother    Cervical cancer Paternal Grandmother    Breast cancer Neg Hx    Colon cancer Neg Hx    Ovarian cancer Neg Hx    Endometrial cancer Neg Hx    Pancreatic cancer Neg Hx    Prostate cancer Neg Hx     SOCIAL HISTORY: Social History   Socioeconomic History   Marital status: Married    Spouse name: Not on file   Number of children: 1   Years of education: Not on file   Highest education level: Not on file  Occupational History   Occupation: claims adjuster  Tobacco Use   Smoking status: Never   Smokeless tobacco: Never  Substance and Sexual Activity   Alcohol use: Yes    Comment: very occ   Drug use: No   Sexual activity: Not Currently    Comment: no periods due to radiation  Other Topics Concern   Not on file  Social History Narrative   Not on file   Social Determinants of Health   Financial Resource Strain: Not on file  Food Insecurity: No Food Insecurity (11/21/2022)   Hunger Vital Sign    Worried About Running Out of Food in the Last Year: Never true    Ran Out of Food in the Last Year: Never true  Transportation Needs: No Transportation Needs (11/21/2022)   PRAPARE - Transportation    Lack of Transportation (Medical): No    Lack of Transportation (Non-Medical): No  Physical Activity: Not on file  Stress: Not on file  Social Connections: Moderately Integrated (11/21/2022)   Social  Connection and Isolation Panel [NHANES]    Frequency of Communication with Friends and Family: More than three times a week    Frequency of Social Gatherings with Friends and Family: Not on file    Attends Religious Services: More than 4 times per year    Active Member of Clubs or Organizations: No    Attends Club or Organization Meetings: Not on file    Marital Status: Married  Intimate Partner Violence: Not At Risk (11/21/2022)   Humiliation, Afraid, Rape, and Kick questionnaire    Fear of Current or Ex-Partner: No    Emotionally Abused: No    Physically Abused: No    Sexually Abused: No    REVIEW OF SYSTEMS: New patient intake form was reviewed.  Complete 10-system review is negative except for the following: none  PHYSICAL EXAM: BP 122/88 (BP Location: Left Arm, Patient Position: Sitting)   Pulse (!) 57   Resp 16   Ht 5' 11" (1.803 m)   Wt 187 lb (84.8 kg)   LMP 09/12/2011   SpO2 100%   BMI 26.08 kg/m  Constitutional: No acute distress. Neuro/Psych: Alert, oriented.  Head and Neck: Normocephalic, atraumatic. Neck symmetric without masses. Sclera anicteric.  Respiratory: Normal work of breathing. Clear to auscultation bilaterally. Cardiovascular: Regular rate and rhythm, no murmurs, rubs, or gallops. Abdomen: Normoactive bowel sounds. Soft, non-distended, non-tender to palpation. No masses or hepatosplenomegaly appreciated.  Extremities: Grossly normal range of motion. Warm, well perfused. No edema bilaterally. Skin: No rashes or lesions. Lymphatic: No   cervical, supraclavicular, or inguinal adenopathy. Genitourinary: External genitalia without lesions. Urethral meatus without lesions or prolapse. On speculum exam, cervix not clearly visualized, likely due to flush with vaginal wall. Attempt to probe possible cervical os with os finder, unsuccessful. Vaginal apex, particularly at right apical fornix with erythema and friability. Hyperglandularity at this area. Bimanual exam  reveals cervix flush with vagina. Narrowing of apex of vagina with likely agglutination. Rectovaginal exam confirms the above findings and reveals normal sphincter tone and no masses or nodularity. Exam chaperoned by Melissa Cross, NP   LABORATORY AND RADIOLOGIC DATA: Outside medical records were reviewed to synthesize the above history, along with the history and physical obtained during the visit.  Outside laboratory, pathology reports were reviewed, with pertinent results below.    WBC  Date Value Ref Range Status  10/16/2022 3.3 (L) 3.4 - 10.8 x10E3/uL Final  12/27/2020 3.9 (L) 4.0 - 10.5 K/uL Final   WBC Count  Date Value Ref Range Status  08/09/2022 3.9 (L) 4.0 - 10.5 K/uL Final   Hemoglobin  Date Value Ref Range Status  10/16/2022 14.6 11.1 - 15.9 g/dL Final   HGB  Date Value Ref Range Status  12/17/2013 13.3 11.6 - 15.9 g/dL Final   HCT  Date Value Ref Range Status  12/17/2013 40.6 34.8 - 46.6 % Final   Hematocrit  Date Value Ref Range Status  10/16/2022 43.7 34.0 - 46.6 % Final   Platelets  Date Value Ref Range Status  10/16/2022 206 150 - 450 x10E3/uL Final   LDH  Date Value Ref Range Status  08/20/2011 167 94 - 250 U/L Final   Magnesium  Date Value Ref Range Status  04/10/2020 1.7 1.7 - 2.4 mg/dL Final    Comment:    Performed at [facility], [address] [place], [place] [address]   Creatinine  Date Value Ref Range Status  12/17/2013 0.8 0.6 - 1.1 mg/dL Final   Creat  Date Value Ref Range Status  10/27/2015 0.96 0.50 - 1.10 mg/dL Final   Creatinine, Ser  Date Value Ref Range Status  10/16/2022 0.82 0.57 - 1.00 mg/dL Final   AST  Date Value Ref Range Status  12/11/2021 13 0 - 40 IU/L Final  12/17/2013 15 5 - 34 U/L Final   ALT  Date Value Ref Range Status  12/11/2021 16 0 - 32 IU/L Final  12/17/2013 19 0 - 55 U/L Final   CEA  Date Value Ref Range Status  02/12/2016 <0.5 ng/mL Final    Comment:    Reference Range:  Non-Smoker:   <2.5                   Smoker:      <5.0   This test was performed using the Siemens Chemiluminescent method. Values obtained from different assay methods cannot be used interchangeably. CEA levels, regardless of value, should not be interpreted as absolute evidence of the presence or absence of disease.     07/24/2015 <0.5 0.0 - 5.0 ng/mL Final  01/17/2015 <0.5 0.0 - 5.0 ng/mL Final  07/19/2014 <0.5 0.0 - 5.0 ng/mL Final  12/17/2013 <0.5 0.0 - 5.0 ng/mL Final  06/07/2013 <0.5 0.0 - 5.0 ng/mL Final  12/07/2012 <0.5 0.0 - 5.0 ng/mL Final  06/01/2012 <0.5 0.0 - 5.0 ng/mL Final  01/07/2012 <0.5 0.0 - 5.0 ng/mL Final  05/27/2011 0.6 0.0 - 5.0 ng/mL Final  02/15/2011 1.1 0.0 - 5.0 ng/mL Final   Surgical pathology (07/02/22): FINAL   MICROSCOPIC DIAGNOSIS:   A. ENDOCERVIX, CURETTAGE:  Very scant mucus with admixed endocervical cells and minute fragment of  metaplastic squamous epithelium   B. ENDOCERVIX, LEEP:  Benign endocervix with squamous metaplasia  Negative for dysplasia and diagnostic viral change (see comment)   C. 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  LEFT VAGINAL SIDE WALL, BIOPSY:  Severe squamous dysplasia (HSIL, VAIN 3)   E.  RIGHT VAGINAL SIDE WALL, BIOPSY:  Severe squamous dysplasia (HSIL, VAIN 3)  

## 2022-11-25 NOTE — Patient Instructions (Signed)
Plan to begin taking doxycycline 100 mg twice daily for seven days along with flagyl 500 mg twice daily for a total of 7 days as well. Avoid use of alcohol when taking flagyl.  Preparing for your Surgery  Plan for surgery on December 17, 2022 with Dr. Bernadene Bell at [facility]. You will be scheduled for pelvic examination under anesthesia, possible biopsies of cervix and vagina, possible ultrasound guidance.   Pre-operative Testing -You will receive a phone call from presurgical testing at [facility] to discuss surgery instructions and arrange for lab work if needed.  -Bring your insurance card, copy of an advanced directive if applicable, medication list.  -You should not be taking blood thinners or aspirin at least ten days prior to surgery unless instructed by your surgeon.  -Do not take supplements such as fish oil (omega 3), red yeast rice, turmeric before your surgery. You want to avoid medications with aspirin in them including headache powders such as BC or Goody's), Excedrin migraine.  Day Before Surgery at [place] will be advised you can have clear liquids up until 3 hours before your surgery.    Your role in recovery Your role is to become active as soon as directed by your doctor, while still giving yourself time to heal.  Rest when you feel tired. You will be asked to do the following in order to speed your recovery:  - Cough and breathe deeply. This helps to clear and expand your lungs and can prevent pneumonia after surgery.  - [place]. Do mild physical activity. Walking or moving your legs help your circulation and body functions return to normal. Do not try to get up or walk alone the first time after surgery.   -If you develop swelling on one leg or the other, pain in the back of your leg, redness/warmth in one of your legs, please call the office or go to the Emergency Room to have a doppler to rule out a  blood clot. For shortness of breath, chest pain-seek care in the Emergency Room as soon as possible. - Actively manage your pain. Managing your pain lets you move in comfort. We will ask you to rate your pain on a scale of zero to 10. It is your responsibility to tell your doctor or nurse where and how much you hurt so your pain can be treated.  Special Considerations -Your final pathology results from surgery should be available around one week after surgery and the results will be relayed to you when available.  -FMLA forms can be faxed to [phone] and please allow 5-7 business days for completion.  Pain Management After Surgery -Make sure that you have Tylenol and Ibuprofen at home IF [place] to use on a regular basis after surgery for pain control. We recommend alternating the medications every hour to six hours since they work differently and are processed in the body differently for pain relief.  -Review the attached handout on narcotic use and their risks and side effects.   Bowel Regimen -It is important to prevent constipation and drink adequate amounts of liquids.   Risks of Surgery Risks of surgery are low but include bleeding, infection, damage to surrounding structures, re-operation, blood clots, and very rarely death.  AFTER SURGERY INSTRUCTIONS  Return to work: 1-2 days  Activity: 1. Be up and out of the bed during the day.  Take a nap  if needed.  You may walk up steps but be careful and use the hand rail.  Stair climbing will tire you more than you think, you may need to stop part way and rest.   2. No lifting or straining for 24 hours over 10 pounds. No pushing, pulling, straining for 24 hours.  3. No driving for minimum 24 hours after surgery.  Do not drive if you are taking narcotic pain medicine and make sure that your reaction time has returned.   4. You can shower as soon as the next day after surgery. Shower daily. No tub baths  or submerging your body in water until cleared by your surgeon. If you have the soap that was given to you by pre-surgical testing that was used before surgery, you do not need to use it afterwards because this can irritate your incisions.   5. No sexual activity and nothing in the vagina for 2 weeks minimum.  6. You may experience vaginal spotting and discharge after surgery.  The spotting is normal but if you experience heavy bleeding, call our office.  7. Take Tylenol or ibuprofen first for pain if you are able to take these medications. Monitor your Tylenol intake to a max of 4,000 mg in a 24 hour period.   Diet: 1. Low sodium Heart Healthy Diet is recommended but you are cleared to resume your normal (before surgery) diet after your procedure.  2. It is safe to use a laxative, such as Miralax or Colace, if you have difficulty moving your bowels.   Wound Care: 1. Keep clean and dry.  Shower daily.  Reasons to call the Doctor: Fever - Oral temperature greater than 100.4 degrees Fahrenheit Foul-smelling vaginal discharge Difficulty urinating Nausea and vomiting Increased pain at the site of the incision that is unrelieved with pain medicine. Difficulty breathing with or without chest pain New calf pain especially if only on one side Sudden, continuing increased vaginal bleeding with or without clots.   Contacts: For questions or concerns you should contact:  Dr. Bernadene Bell at [place], NP at [phone]  After Hours: call [phone] and have the GYN Oncologist paged/contacted (after 5 pm or on the weekends).  Messages sent via mychart are for non-urgent matters and are not responded to after hours so for urgent needs, please call the after hours number.

## 2022-11-26 ENCOUNTER — Other Ambulatory Visit (HOSPITAL_COMMUNITY): Payer: Self-pay | Admitting: Psychiatry

## 2022-11-26 ENCOUNTER — Telehealth: Payer: Self-pay

## 2022-11-26 DIAGNOSIS — Z9889 Other specified postprocedural states: Secondary | ICD-10-CM

## 2022-11-26 NOTE — Telephone Encounter (Signed)
Per Joylene John NP, pt is aware to hold Eliquis the day before surgery and the day of surgery. Surgery is 2/27. Last dose should be 2/25. Pt voiced an understanding.

## 2022-11-27 ENCOUNTER — Encounter: Payer: Self-pay | Admitting: Psychiatry

## 2022-11-27 NOTE — Progress Notes (Signed)
Patient here for new patient consultation with Dr. Ernestina Patches and for a pre-operative appointment prior to her scheduled surgery on Dec 17, 2022. She is scheduled for pelvic examination under anesthesia, possible biopsies of cervix and vagina, possible ultrasound guidance. The surgery was discussed in detail.  See after visit summary for additional details.     Discussed post-op pain management in detail including the aspects of the enhanced recovery pathway. We discussed the use of tylenol post-op and to monitor for a maximum of 4,000 mg in a 24 hour period.   Discussed measures to take at home to prevent DVT including frequent mobility.  Reportable signs and symptoms of DVT discussed. Post-operative instructions discussed and expectations for after surgery.    10 minutes spent preparing information and with the patient.  Verbalizing understanding of material discussed. No needs or concerns voiced at the end of the visit.   Advised patient to call for any needs.    This appointment is included in the global surgical bundle as pre-operative teaching and has no charge.

## 2022-11-27 NOTE — Patient Instructions (Signed)
Plan to begin taking doxycycline 100 mg twice daily for seven days along with flagyl 500 mg twice daily for a total of 7 days as well. Avoid use of alcohol when taking flagyl.   Preparing for your Surgery   Plan for surgery on December 17, 2022 with Dr. Bernadene Bell at [facility]. You will be scheduled for pelvic examination under anesthesia, possible biopsies of cervix and vagina, possible ultrasound guidance.    Pre-operative Testing -You will receive a phone call from presurgical testing at [facility] to discuss surgery instructions and arrange for lab work if needed.   -Bring your insurance card, copy of an advanced directive if applicable, medication list.   -You should not be taking blood thinners or aspirin at least ten days prior to surgery unless instructed by your surgeon.   -Do not take supplements such as fish oil (omega 3), red yeast rice, turmeric before your surgery. You want to avoid medications with aspirin in them including headache powders such as BC or Goody's), Excedrin migraine.   Day Before Surgery at [place] will be advised you can have clear liquids up until 3 hours before your surgery.     Your role in recovery Your role is to become active as soon as directed by your doctor, while still giving yourself time to heal.  Rest when you feel tired. You will be asked to do the following in order to speed your recovery:   - Cough and breathe deeply. This helps to clear and expand your lungs and can prevent pneumonia after surgery.  - [place]. Do mild physical activity. Walking or moving your legs help your circulation and body functions return to normal. Do not try to get up or walk alone the first time after surgery.   -If you develop swelling on one leg or the other, pain in the back of your leg, redness/warmth in one of your legs, please call the office or go to the Emergency Room to have a doppler to  rule out a blood clot. For shortness of breath, chest pain-seek care in the Emergency Room as soon as possible. - Actively manage your pain. Managing your pain lets you move in comfort. We will ask you to rate your pain on a scale of zero to 10. It is your responsibility to tell your doctor or nurse where and how much you hurt so your pain can be treated.   Special Considerations -Your final pathology results from surgery should be available around one week after surgery and the results will be relayed to you when available.   -FMLA forms can be faxed to [phone] and please allow 5-7 business days for completion.   Pain Management After Surgery -Make sure that you have Tylenol and Ibuprofen at home IF [place] to use on a regular basis after surgery for pain control. We recommend alternating the medications every hour to six hours since they work differently and are processed in the body differently for pain relief.   -Review the attached handout on narcotic use and their risks and side effects.    Bowel Regimen -It is important to prevent constipation and drink adequate amounts of liquids.    Risks of Surgery Risks of surgery are low but include bleeding, infection, damage to surrounding structures, re-operation, blood clots, and very rarely death.   AFTER SURGERY INSTRUCTIONS   Return to work: 1-2 days  Activity: 1. Be up and out of the bed during the day.  Take a nap if needed.  You may walk up steps but be careful and use the hand rail.  Stair climbing will tire you more than you think, you may need to stop part way and rest.    2. No lifting or straining for 24 hours over 10 pounds. No pushing, pulling, straining for 24 hours.   3. No driving for minimum 24 hours after surgery.  Do not drive if you are taking narcotic pain medicine and make sure that your reaction time has returned.    4. You can shower as soon as the next day after surgery.  Shower daily. No tub baths or submerging your body in water until cleared by your surgeon. If you have the soap that was given to you by pre-surgical testing that was used before surgery, you do not need to use it afterwards because this can irritate your incisions.    5. No sexual activity and nothing in the vagina for 2 weeks minimum.   6. You may experience vaginal spotting and discharge after surgery.  The spotting is normal but if you experience heavy bleeding, call our office.   7. Take Tylenol or ibuprofen first for pain if you are able to take these medications. Monitor your Tylenol intake to a max of 4,000 mg in a 24 hour period.    Diet: 1. Low sodium Heart Healthy Diet is recommended but you are cleared to resume your normal (before surgery) diet after your procedure.   2. It is safe to use a laxative, such as Miralax or Colace, if you have difficulty moving your bowels.    Wound Care: 1. Keep clean and dry.  Shower daily.   Reasons to call the Doctor: Fever - Oral temperature greater than 100.4 degrees Fahrenheit Foul-smelling vaginal discharge Difficulty urinating Nausea and vomiting Increased pain at the site of the incision that is unrelieved with pain medicine. Difficulty breathing with or without chest pain New calf pain especially if only on one side Sudden, continuing increased vaginal bleeding with or without clots.   Contacts: For questions or concerns you should contact:   Dr. Bernadene Bell at [place], NP at [phone]   After Hours: call [phone] and have the GYN Oncologist paged/contacted (after 5 pm or on the weekends).   Messages sent via mychart are for non-urgent matters and are not responded to after hours so for urgent needs, please call the after hours number.

## 2022-12-12 ENCOUNTER — Encounter (HOSPITAL_BASED_OUTPATIENT_CLINIC_OR_DEPARTMENT_OTHER): Payer: Self-pay | Admitting: Psychiatry

## 2022-12-12 ENCOUNTER — Encounter: Payer: No Typology Code available for payment source | Admitting: Nurse Practitioner

## 2022-12-13 ENCOUNTER — Encounter (HOSPITAL_BASED_OUTPATIENT_CLINIC_OR_DEPARTMENT_OTHER): Payer: Self-pay | Admitting: Psychiatry

## 2022-12-13 NOTE — Progress Notes (Signed)
Spoke w/ via phone for pre-op interview--- pt Lab needs dos----  Avaya, ekg             Lab results------ no COVID test -----patient states asymptomatic no test needed Arrive at ------- 1100 on 12-17-2022 NPO after MN NO Solid Food.  Clear liquids from MN until--- 1000 Med rec completed Medications to take morning of surgery ----- crestor, synthroid Diabetic medication ----- do not take jardiance, and do not do insulin morning of surgery Patient instructed no nail polish to be worn day of surgery Patient instructed to bring photo id and insurance card day of surgery Patient aware to have Driver (ride ) / caregiver    for 24 hours after surgery -- husband, reggie  Patient Special Instructions ----- pt stated was given instructions from office to stop her eliquis , not to take dose day before surgery or day of surgery, she stated her last dose will be 12-15-2022. Also, pt take mounjaro for diabetes was not aware she was not to do week prior to surgery but she so happen to forget to it this week on Wednesday, last dose 12-04-2022.  Pt verbalized understanding not to do prior to surgery wait until after surgery.  Pre-Op special Istructions ----- n/a Patient verbalized understanding of instructions that were given at this phone interview. Patient denies shortness of breath, chest pain, fever, cough at this phone interview.

## 2022-12-16 ENCOUNTER — Telehealth: Payer: Self-pay | Admitting: Surgery

## 2022-12-16 NOTE — Telephone Encounter (Signed)
Telephone call to check on pre-operative status.  Patient compliant with pre-operative instructions.  Reinforced nothing to eat after midnight. Clear liquids until 10am. Patient to arrive at 11am.  No questions or concerns voiced.  Instructed to call for any needs.

## 2022-12-17 ENCOUNTER — Other Ambulatory Visit: Payer: Self-pay

## 2022-12-17 ENCOUNTER — Ambulatory Visit (HOSPITAL_COMMUNITY)
Admission: RE | Admit: 2022-12-17 | Payer: No Typology Code available for payment source | Source: Ambulatory Visit | Admitting: Psychiatry

## 2022-12-17 ENCOUNTER — Encounter (HOSPITAL_BASED_OUTPATIENT_CLINIC_OR_DEPARTMENT_OTHER)
Admission: RE | Disposition: A | Discharge status: Home / Self Care | Payer: Self-pay | Source: Home / Self Care | Attending: Psychiatry

## 2022-12-17 ENCOUNTER — Ambulatory Visit (HOSPITAL_BASED_OUTPATIENT_CLINIC_OR_DEPARTMENT_OTHER): Payer: No Typology Code available for payment source | Admitting: Anesthesiology

## 2022-12-17 ENCOUNTER — Encounter (HOSPITAL_BASED_OUTPATIENT_CLINIC_OR_DEPARTMENT_OTHER): Payer: Self-pay | Admitting: Psychiatry

## 2022-12-17 ENCOUNTER — Ambulatory Visit (HOSPITAL_BASED_OUTPATIENT_CLINIC_OR_DEPARTMENT_OTHER)
Admission: RE | Admit: 2022-12-17 | Discharge: 2022-12-17 | Disposition: A | Discharge status: Home / Self Care | Payer: No Typology Code available for payment source | Attending: Psychiatry | Admitting: Psychiatry

## 2022-12-17 DIAGNOSIS — E079 Disorder of thyroid, unspecified: Secondary | ICD-10-CM | POA: Diagnosis not present

## 2022-12-17 DIAGNOSIS — D072 Carcinoma in situ of vagina: Secondary | ICD-10-CM | POA: Diagnosis not present

## 2022-12-17 DIAGNOSIS — Z8049 Family history of malignant neoplasm of other genital organs: Secondary | ICD-10-CM | POA: Insufficient documentation

## 2022-12-17 DIAGNOSIS — Z794 Long term (current) use of insulin: Secondary | ICD-10-CM | POA: Insufficient documentation

## 2022-12-17 DIAGNOSIS — Z801 Family history of malignant neoplasm of trachea, bronchus and lung: Secondary | ICD-10-CM | POA: Insufficient documentation

## 2022-12-17 DIAGNOSIS — Z86711 Personal history of pulmonary embolism: Secondary | ICD-10-CM | POA: Diagnosis not present

## 2022-12-17 DIAGNOSIS — Z7984 Long term (current) use of oral hypoglycemic drugs: Secondary | ICD-10-CM

## 2022-12-17 DIAGNOSIS — Z833 Family history of diabetes mellitus: Secondary | ICD-10-CM | POA: Insufficient documentation

## 2022-12-17 DIAGNOSIS — D069 Carcinoma in situ of cervix, unspecified: Secondary | ICD-10-CM | POA: Diagnosis present

## 2022-12-17 DIAGNOSIS — E119 Type 2 diabetes mellitus without complications: Secondary | ICD-10-CM | POA: Insufficient documentation

## 2022-12-17 DIAGNOSIS — Z923 Personal history of irradiation: Secondary | ICD-10-CM | POA: Diagnosis not present

## 2022-12-17 DIAGNOSIS — Z85048 Personal history of other malignant neoplasm of rectum, rectosigmoid junction, and anus: Secondary | ICD-10-CM | POA: Insufficient documentation

## 2022-12-17 DIAGNOSIS — I82409 Acute embolism and thrombosis of unspecified deep veins of unspecified lower extremity: Secondary | ICD-10-CM | POA: Diagnosis not present

## 2022-12-17 DIAGNOSIS — Z86718 Personal history of other venous thrombosis and embolism: Secondary | ICD-10-CM | POA: Diagnosis not present

## 2022-12-17 DIAGNOSIS — E785 Hyperlipidemia, unspecified: Secondary | ICD-10-CM | POA: Insufficient documentation

## 2022-12-17 DIAGNOSIS — I82403 Acute embolism and thrombosis of unspecified deep veins of lower extremity, bilateral: Secondary | ICD-10-CM

## 2022-12-17 DIAGNOSIS — Z01818 Encounter for other preprocedural examination: Secondary | ICD-10-CM

## 2022-12-17 HISTORY — PX: OPERATIVE ULTRASOUND: SHX5996

## 2022-12-17 HISTORY — DX: Personal history of vaginal dysplasia: Z87.411

## 2022-12-17 HISTORY — DX: Type 2 diabetes mellitus without complications: E11.9

## 2022-12-17 HISTORY — DX: Mixed hyperlipidemia: E78.2

## 2022-12-17 HISTORY — DX: Presence of spectacles and contact lenses: Z97.3

## 2022-12-17 HISTORY — DX: Migraine, unspecified, not intractable, without status migrainosus: G43.909

## 2022-12-17 HISTORY — DX: Personal history of antineoplastic chemotherapy: Z92.21

## 2022-12-17 HISTORY — DX: Iron deficiency anemia, unspecified: D50.9

## 2022-12-17 HISTORY — DX: Hypothyroidism, unspecified: E03.9

## 2022-12-17 HISTORY — DX: Carcinoma in situ of vulva: D07.1

## 2022-12-17 HISTORY — DX: Urgency of urination: R39.15

## 2022-12-17 HISTORY — DX: Long term (current) use of anticoagulants: Z79.01

## 2022-12-17 HISTORY — DX: Personal history of cervical dysplasia: Z87.410

## 2022-12-17 LAB — POCT I-STAT, CHEM 8
BUN: 18 mg/dL (ref 6–20)
Calcium, Ion: 1.24 mmol/L (ref 1.15–1.40)
Chloride: 106 mmol/L (ref 98–111)
Creatinine, Ser: 0.8 mg/dL (ref 0.44–1.00)
Glucose, Bld: 137 mg/dL — ABNORMAL HIGH (ref 70–99)
HCT: 44 % (ref 36.0–46.0)
Hemoglobin: 15 g/dL (ref 12.0–15.0)
Potassium: 4.5 mmol/L (ref 3.5–5.1)
Sodium: 140 mmol/L (ref 135–145)
TCO2: 27 mmol/L (ref 22–32)

## 2022-12-17 LAB — GLUCOSE, CAPILLARY: Glucose-Capillary: 78 mg/dL (ref 70–99)

## 2022-12-17 SURGERY — EXAM UNDER ANESTHESIA
Anesthesia: General | Site: Vagina

## 2022-12-17 MED ORDER — PHENYLEPHRINE 80 MCG/ML (10ML) SYRINGE FOR IV PUSH (FOR BLOOD PRESSURE SUPPORT)
PREFILLED_SYRINGE | INTRAVENOUS | Status: AC
  Filled 2022-12-17: qty 10

## 2022-12-17 MED ORDER — LIDOCAINE HCL (PF) 1 % IJ SOLN
INTRAMUSCULAR | Status: DC | PRN
  Administered 2022-12-17: 10 mL

## 2022-12-17 MED ORDER — DEXAMETHASONE SODIUM PHOSPHATE 10 MG/ML IJ SOLN
INTRAMUSCULAR | Status: DC | PRN
  Administered 2022-12-17: 5 mg via INTRAVENOUS

## 2022-12-17 MED ORDER — ACETIC ACID 5 % SOLN
Status: DC | PRN
  Administered 2022-12-17: 1 via TOPICAL

## 2022-12-17 MED ORDER — IODINE STRONG (LUGOLS) 5 % PO SOLN
ORAL | Status: DC | PRN
  Administered 2022-12-17: 3 mL

## 2022-12-17 MED ORDER — DEXMEDETOMIDINE HCL IN NACL 80 MCG/20ML IV SOLN
INTRAVENOUS | Status: DC | PRN
  Administered 2022-12-17: 8 ug via BUCCAL

## 2022-12-17 MED ORDER — ACETAMINOPHEN 500 MG PO TABS
1000.0000 mg | ORAL_TABLET | ORAL | Status: AC
  Administered 2022-12-17: 1000 mg via ORAL

## 2022-12-17 MED ORDER — KETOROLAC TROMETHAMINE 30 MG/ML IJ SOLN
INTRAMUSCULAR | Status: AC
  Filled 2022-12-17: qty 4

## 2022-12-17 MED ORDER — ESTRADIOL 0.1 MG/GM VA CREA: TOPICAL_CREAM | VAGINAL | 1 refills | Status: DC

## 2022-12-17 MED ORDER — FENTANYL CITRATE (PF) 250 MCG/5ML IJ SOLN
INTRAMUSCULAR | Status: DC | PRN
  Administered 2022-12-17: 50 ug via INTRAVENOUS

## 2022-12-17 MED ORDER — LACTATED RINGERS IV SOLN: INTRAVENOUS | Status: DC

## 2022-12-17 MED ORDER — PHENYLEPHRINE 80 MCG/ML (10ML) SYRINGE FOR IV PUSH (FOR BLOOD PRESSURE SUPPORT)
PREFILLED_SYRINGE | INTRAVENOUS | Status: DC | PRN
  Administered 2022-12-17 (×3): 80 ug via INTRAVENOUS

## 2022-12-17 MED ORDER — APIXABAN 5 MG PO TABS: ORAL_TABLET | ORAL | 3 refills | Status: DC

## 2022-12-17 MED ORDER — ONDANSETRON HCL 4 MG/2ML IJ SOLN
INTRAMUSCULAR | Status: DC | PRN
  Administered 2022-12-17: 4 mg via INTRAVENOUS

## 2022-12-17 MED ORDER — ONDANSETRON HCL 4 MG/2ML IJ SOLN
INTRAMUSCULAR | Status: AC
  Filled 2022-12-17: qty 2

## 2022-12-17 MED ORDER — DEXMEDETOMIDINE HCL IN NACL 80 MCG/20ML IV SOLN
INTRAVENOUS | Status: AC
  Filled 2022-12-17: qty 40

## 2022-12-17 MED ORDER — KETOROLAC TROMETHAMINE 30 MG/ML IJ SOLN
INTRAMUSCULAR | Status: DC | PRN
  Administered 2022-12-17: 30 mg via INTRAVENOUS

## 2022-12-17 MED ORDER — PROPOFOL 10 MG/ML IV BOLUS
INTRAVENOUS | Status: DC | PRN
  Administered 2022-12-17: 180 mg via INTRAVENOUS

## 2022-12-17 MED ORDER — MIDAZOLAM HCL 2 MG/2ML IJ SOLN
INTRAMUSCULAR | Status: DC | PRN
  Administered 2022-12-17: 2 mg via INTRAVENOUS

## 2022-12-17 MED ORDER — ROCURONIUM BROMIDE 10 MG/ML (PF) SYRINGE
PREFILLED_SYRINGE | INTRAVENOUS | Status: AC
  Filled 2022-12-17: qty 20

## 2022-12-17 MED ORDER — SCOPOLAMINE 1 MG/3DAYS TD PT72
1.0000 | MEDICATED_PATCH | TRANSDERMAL | Status: DC
  Administered 2022-12-17: 1.5 mg via TRANSDERMAL

## 2022-12-17 MED ORDER — DEXAMETHASONE SODIUM PHOSPHATE 10 MG/ML IJ SOLN
INTRAMUSCULAR | Status: AC
  Filled 2022-12-17: qty 2

## 2022-12-17 MED ORDER — MIDAZOLAM HCL 2 MG/2ML IJ SOLN
INTRAMUSCULAR | Status: AC
  Filled 2022-12-17: qty 2

## 2022-12-17 MED ORDER — SILVER NITRATE-POT NITRATE 75-25 % EX MISC
CUTANEOUS | Status: DC | PRN
  Administered 2022-12-17: 2

## 2022-12-17 MED ORDER — ACETAMINOPHEN 500 MG PO TABS
ORAL_TABLET | ORAL | Status: AC
  Filled 2022-12-17: qty 2

## 2022-12-17 MED ORDER — SCOPOLAMINE 1 MG/3DAYS TD PT72
MEDICATED_PATCH | TRANSDERMAL | Status: AC
  Filled 2022-12-17: qty 1

## 2022-12-17 MED ORDER — DEXAMETHASONE SODIUM PHOSPHATE 10 MG/ML IJ SOLN: 4.0000 mg | INTRAMUSCULAR | Status: DC

## 2022-12-17 MED ORDER — 0.9 % SODIUM CHLORIDE (POUR BTL) OPTIME
TOPICAL | Status: DC | PRN
  Administered 2022-12-17: 500 mL

## 2022-12-17 MED ORDER — LIDOCAINE 2% (20 MG/ML) 5 ML SYRINGE
INTRAMUSCULAR | Status: DC | PRN
  Administered 2022-12-17: 50 mg via INTRAVENOUS

## 2022-12-17 MED ORDER — FENTANYL CITRATE (PF) 100 MCG/2ML IJ SOLN
INTRAMUSCULAR | Status: AC
  Filled 2022-12-17: qty 2

## 2022-12-17 SURGICAL SUPPLY — 22 items
BLADE EXTENDED COATED 6.5IN (ELECTRODE) IMPLANT
BLADE SURG 11 STRL SS (BLADE) IMPLANT
BLADE SURG 15 STRL LF DISP TIS (BLADE) IMPLANT
BLADE SURG 15 STRL SS (BLADE)
CATH ROBINSON RED A/P 16FR (CATHETERS) IMPLANT
GAUZE 4X4 16PLY ~~LOC~~+RFID DBL (SPONGE) ×2 IMPLANT
GLOVE BIO SURGEON STRL SZ 6 (GLOVE) ×4 IMPLANT
GLOVE BIO SURGEON STRL SZ 6.5 (GLOVE) IMPLANT
GLOVE SURG SS PI 6.5 STRL IVOR (GLOVE) IMPLANT
GOWN STRL REUS W/ TWL LRG LVL3 (GOWN DISPOSABLE) IMPLANT
GOWN STRL REUS W/TWL LRG LVL3 (GOWN DISPOSABLE) ×2 IMPLANT
KIT TURNOVER CYSTO (KITS) ×2 IMPLANT
NDL HYPO 25X1 1.5 SAFETY (NEEDLE) IMPLANT
NEEDLE HYPO 25X1 1.5 SAFETY (NEEDLE) ×2 IMPLANT
NS IRRIG 500ML POUR BTL (IV SOLUTION) IMPLANT
PACK PERINEAL COLD (PAD) ×2 IMPLANT
PACK VAGINAL WOMENS (CUSTOM PROCEDURE TRAY) ×2 IMPLANT
SHEATH ULTRASOUND LF (SHEATH) IMPLANT
SLEEVE SCD COMPRESS KNEE MED (STOCKING) ×2 IMPLANT
SYR BULB IRRIG 60ML STRL (SYRINGE) ×2 IMPLANT
TOWEL OR 17X24 6PK STRL BLUE (TOWEL DISPOSABLE) ×4 IMPLANT
WATER STERILE IRR 1000ML POUR (IV SOLUTION) ×2 IMPLANT

## 2022-12-17 NOTE — Discharge Instructions (Addendum)
AFTER SURGERY INSTRUCTIONS   Return to work: 1-2 days  YOU CAN RESUME TAKING YOUR ELIQUIS TOMORROW, 12/18/2022.  YOU CAN RESUME TAKING YOUR VAGINAL ESTROGEN 48 HOURS AFTER SURGERY.   Activity: 1. Be up and out of the bed during the day.  Take a nap if needed.  You may walk up steps but be careful and use the hand rail.  Stair climbing will tire you more than you think, you may need to stop part way and rest.    2. No lifting or straining for 24 hours over 10 pounds. No pushing, pulling, straining for 24 hours.   3. No driving for minimum 24 hours after surgery.  Do not drive if you are taking narcotic pain medicine and make sure that your reaction time has returned.    4. You can shower as soon as the next day after surgery. Shower daily. No tub baths or submerging your body in water until cleared by your surgeon. If you have the soap that was given to you by pre-surgical testing that was used before surgery, you do not need to use it afterwards because this can irritate your incisions.    5. No sexual activity and nothing in the vagina for 2 weeks minimum.   6. You may experience vaginal spotting and discharge after surgery.  The spotting is normal but if you experience heavy bleeding, call our office.   7. Take Tylenol first for pain if you are able to take these medications. Monitor your Tylenol intake to a max of 4,000 mg in a 24 hour period.    Diet: 1. Low sodium Heart Healthy Diet is recommended but you are cleared to resume your normal (before surgery) diet after your procedure.   2. It is safe to use a laxative, such as Miralax or Colace, if you have difficulty moving your bowels.    Wound Care: 1. Keep clean and dry.  Shower daily.   Reasons to call the Doctor: Fever - Oral temperature greater than 100.4 degrees Fahrenheit Foul-smelling vaginal discharge Difficulty urinating Nausea and vomiting Increased pain at the site of the incision that is unrelieved with pain  medicine. Difficulty breathing with or without chest pain New calf pain especially if only on one side Sudden, continuing increased vaginal bleeding with or without clots.   Contacts: For questions or concerns you should contact:   [name] at [place], [name] at [phone]   After Hours: call [phone] and have the GYN Oncologist paged/contacted (after 5 pm or on the weekends).   Messages sent via mychart are for non-urgent matters and are not responded to after hours so for urgent needs, please call the after hours number.     Post Anesthesia Home Care Instructions  Activity: Get plenty of rest for the remainder of the day. A responsible individual must stay with you for 24 hours following the procedure.  For the next 24 hours, DO NOT: -Drive a car -Paediatric nurse -Drink alcoholic beverages -Take any medication unless instructed by your physician -Make any legal decisions or sign important papers.  Meals: Start with liquid foods such as gelatin or soup. Progress to regular foods as tolerated. Avoid greasy, spicy, heavy foods. If nausea and/or vomiting occur, drink only clear liquids until the nausea and/or vomiting subsides. Call your physician if vomiting continues.  Special Instructions/Symptoms: Your throat may feel dry or sore from the anesthesia or the breathing tube placed in your throat during surgery. If  this causes discomfort, gargle with warm salt water. The discomfort should disappear within 24 hours.  If you had a scopolamine patch placed behind your ear for the management of post- operative nausea and/or vomiting:  1. The medication in the patch is effective for 72 hours, after which it should be removed.  Wrap patch in a tissue and discard in the trash. Wash hands thoroughly with soap and water. 2. You may remove the patch earlier than 72 hours if you experience unpleasant side effects which may include dry mouth, dizziness or  visual disturbances. 3. Avoid touching the patch. Wash your hands with soap and water after contact with the patch.     Do not take any Tylenol until after 6:00 pm, if needed. Do not take any nonsteroidal anti inflammatories until after 110:45 pm , if needed.

## 2022-12-17 NOTE — Transfer of Care (Signed)
Immediate Anesthesia Transfer of Care Note  Patient: [name]  Procedure(s) Performed: PELVIC EXAM UNDER ANESTHESIA; BIOPSY OF CERVIX AND VAGINA (Vagina ) OPERATIVE ULTRASOUND (Abdomen)  Patient Location: PACU  Anesthesia Type:General  Level of Consciousness: drowsy and patient cooperative  Airway & Oxygen Therapy: Patient Spontanous Breathing  Post-op Assessment: Report given to RN and Post -op Vital signs reviewed and stable  Post vital signs: Reviewed and stable  Last Vitals:  Vitals Value Taken Time  BP 112/76 12/17/22 1708  Temp    Pulse 60 12/17/22 1710  Resp 13 12/17/22 1710  SpO2 100 % 12/17/22 1710  Vitals shown include unvalidated device data.  Last Pain:  Vitals:   12/17/22 1531  TempSrc:   PainSc: 0-No pain      Patients Stated Pain Goal: 3 ([id number])  Complications: No notable events documented.

## 2022-12-17 NOTE — Op Note (Addendum)
GYNECOLOGIC ONCOLOGY OPERATIVE NOTE  Date of Service: 12/17/2022  Preoperative Diagnosis: VAIN 3, CIN3  Postoperative Diagnosis: Same, vaginal agglutination, stenotic cervical os  Procedures: Pelvic exam under anesthesia, vaginal and cervical biopsies, ultrasound guidance    Surgeon: Bernadene Bell, MD  Assistants: Joylene John, NP  Anesthesia: General  Estimated Blood Loss: 5 mL    Fluids: 700 ml, crystalloid  Urine Output: Voided prior to procedure  Findings: Vaginal apex with agglutination, occluding view of cervix, bluntly separated. Apparent flush cervix then visualized but no visible cervical os appreciated due to scarring. Acetowhite changes of friable vaginal tissue/cervix after separation from agglutination. Decreased uptake of Lugol's at same location. Representative biopsy obtained. Additional 22m glandular lesion, possible granulation tissue at the right vaginal apex, biopsied.   Specimens:  ID Type Source Tests Collected by Time Destination  1 : right apex vagina Tissue PATH Gyn biopsy SURGICAL PATHOLOGY NBernadene Bell MD 12/17/2022 1640   2 : cervix at 6 o'clock Tissue PATH Gyn biopsy SURGICAL PATHOLOGY NBernadene Bell MD [id number]    Complications:  None  Indications for Procedure: [name] WINGATEis a 89y.o. woman with a history of vaginal and cervical dsyplasia, c/b poor healing following LEEP.  Prior to the procedure, all risks, benefits, and alternatives were discussed and informed surgical consent was signed.  Procedure: Patient was taken to the operating room where general anesthesia with LMA was achieved.  She was positioned in dorsal lithotomy and prepped and draped in sterile fashion.  The above findings were noted.  A sims retractor was placed in the posterior vagina and a deaver retractor was placed in the anterior vagina. The agglutinated vaginal tissue at the vaginal apex was gently separated. At this time what appeared to represent a flush  cervix was visualized at the vaginal apex but the cervical os could not be appreciated.   Acetic acid was applied to the vagina and cervix followed by Lugol's solution with the findings as noted above. A representative biopsy was obtained at 6 o'clock on the likely cervix with Tischler biopsy forceps. And additional biopsy of the glandular tissue in the right vaginal apex. Hemostasis was achieved with a combination of silver nitrate and cautery.  At this time the abdominal ultrasound was used to attempt to visualize the uterus to further guide in identifying the cervix cervical os. A pratt dilator was used to probe the vagina apex to attempt to locate the cervical os, correlating with ultrasound but no apparent cervical os could be identified due to likely scarring. At this time decision was made to conclude the procedure. Local with 1% lidocaine was infiltrated at the biopsy sites.   Patient tolerated the procedure well. Sponge, lap, and instrument counts were correct.  No perioperative antibiotics were indicated for this procedure.  Patient was extubated and taken to the PACU in stable condition.  MBernadene Bell MD Gynecologic Oncology

## 2022-12-17 NOTE — Anesthesia Procedure Notes (Signed)
Procedure Name: LMA Insertion Date/Time: 12/17/2022 4:22 PM  Performed by: Clearnce Sorrel, CRNAPre-anesthesia Checklist: Patient identified, Emergency Drugs available, Suction available and Patient being monitored Patient Re-evaluated:Patient Re-evaluated prior to induction Oxygen Delivery Method: Circle System Utilized Preoxygenation: Pre-oxygenation with 100% oxygen Induction Type: IV induction Ventilation: Mask ventilation without difficulty LMA: LMA inserted LMA Size: 4.0 Number of attempts: 1 Airway Equipment and Method: Bite block Placement Confirmation: positive ETCO2 Tube secured with: Tape Dental Injury: Teeth and Oropharynx as per pre-operative assessment

## 2022-12-17 NOTE — Brief Op Note (Signed)
12/17/2022  5:52 PM  PATIENT:  [name]  57 y.o. female  PRE-OPERATIVE DIAGNOSIS:  VAIN 3/CIN3  POST-OPERATIVE DIAGNOSIS:  VAIN 3/CIN3  PROCEDURE:  Pelvic exam under anesthesia, vaginal and cervical biopsies, ultrasound guidance  SURGEON:  Surgeon(s) and Role:    Bernadene Bell, MD - Primary  PHYSICIAN ASSISTANT:   ASSISTANTS: Joylene John, NP   ANESTHESIA:   general  EBL:  5 mL   BLOOD ADMINISTERED:none  DRAINS: none   LOCAL MEDICATIONS USED:  LIDOCAINE   SPECIMEN:   ID Type Source Tests Collected by Time Destination  1 : right apex vagina Tissue PATH Gyn biopsy SURGICAL PATHOLOGY Bernadene Bell, MD 12/17/2022 1640   2 : cervix at 6 o'clock Tissue PATH Gyn biopsy SURGICAL PATHOLOGY Bernadene Bell, MD 12/17/2022 1640      DISPOSITION OF SPECIMEN:  PATHOLOGY  COUNTS:  YES  TOURNIQUET:  * No tourniquets in log *  DICTATION: .Note written in EPIC  PLAN OF CARE: Discharge to home after PACU  PATIENT DISPOSITION:  PACU - hemodynamically stable.   Delay start of Pharmacological VTE agent (>24hrs) due to surgical blood loss or risk of bleeding: not applicable

## 2022-12-17 NOTE — Interval H&P Note (Signed)
History and Physical Interval Note:  12/17/2022 2:49 PM  [name]  has presented today for surgery, with the diagnosis of VAIN 3.  The various methods of treatment have been discussed with the patient and family. After consideration of risks, benefits and other options for treatment, the patient has consented to  Procedure(s): PELVIC EXAM UNDER ANESTHESIA;POSSIBLE BIOPSY OF CERVIX AND VAGINA (N/A) OPERATIVE ULTRASOUND (N/A) as a surgical intervention.  The patient's history has been reviewed, patient examined, no change in status, stable for surgery.  I have reviewed the patient's chart and labs.  Questions were answered to the patient's satisfaction.     Garnell Phenix

## 2022-12-17 NOTE — Anesthesia Postprocedure Evaluation (Signed)
Anesthesia Post Note  Patient: KADIDIA STHILAIRE  Procedure(s) Performed: PELVIC EXAM UNDER ANESTHESIA; BIOPSY OF CERVIX AND VAGINA (Vagina ) OPERATIVE ULTRASOUND (Abdomen)     Patient location during evaluation: PACU Anesthesia Type: General Level of consciousness: awake and alert Pain management: pain level controlled Vital Signs Assessment: post-procedure vital signs reviewed and stable Respiratory status: spontaneous breathing, nonlabored ventilation, respiratory function stable and patient connected to nasal cannula oxygen Cardiovascular status: blood pressure returned to baseline and stable Postop Assessment: no apparent nausea or vomiting Anesthetic complications: no  No notable events documented.  Last Vitals:  Vitals:   12/17/22 1714 12/17/22 1715  BP: 113/78 113/78  Pulse: 68 63  Resp: 13 12  Temp:    SpO2: 94% 95%    Last Pain:  Vitals:   12/17/22 1714  TempSrc:   PainSc: 0-No pain                 Paytyn Mesta S

## 2022-12-17 NOTE — Anesthesia Preprocedure Evaluation (Addendum)
Anesthesia Evaluation  Patient identified by MRN, date of birth, ID band Patient awake    Reviewed: Allergy & Precautions, NPO status , Patient's Chart, lab work & pertinent test results  Airway Mallampati: I  TM Distance: >3 FB Neck ROM: Full    Dental no notable dental hx. (+) Teeth Intact, Dental Advisory Given   Pulmonary PE   Pulmonary exam normal breath sounds clear to auscultation       Cardiovascular + DVT  Normal cardiovascular exam Rhythm:Regular Rate:Normal  HLD  TTE 2023  1. Left ventricular ejection fraction, by estimation, is 60 to 65%. The  left ventricle has normal function. The left ventricle has no regional  wall motion abnormalities. There is mild asymmetric left ventricular  hypertrophy of the basal and septal  segments. Left ventricular diastolic parameters were normal.   2. Right ventricular systolic function is normal. The right ventricular  size is normal.   3. The mitral valve is abnormal. Trivial mitral valve regurgitation. No  evidence of mitral stenosis.   4. The aortic valve is tricuspid. There is mild calcification of the  aortic valve. Aortic valve regurgitation is not visualized. Aortic valve  sclerosis is present, with no evidence of aortic valve stenosis.   5. The inferior vena cava is normal in size with greater than 50%  respiratory variability, suggesting right atrial pressure of 3 mmHg.     Neuro/Psych  Headaches  negative psych ROS   GI/Hepatic negative GI ROS, Neg liver ROS,,,  Endo/Other  diabetes, Type 2, Insulin Dependent, Oral Hypoglycemic AgentsHypothyroidism    Renal/GU negative Renal ROS  negative genitourinary   Musculoskeletal negative musculoskeletal ROS (+)    Abdominal   Peds  Hematology  (+) Blood dyscrasia (eliquis)   Anesthesia Other Findings CIN3 and VAIN3 c/b poor healing following LEEP and vaginal laser  Reproductive/Obstetrics                              Anesthesia Physical Anesthesia Plan  ASA: 3  Anesthesia Plan: General   Post-op Pain Management: Tylenol PO (pre-op)*   Induction: Intravenous  PONV Risk Score and Plan: 3 and Ondansetron, Dexamethasone and Midazolam  Airway Management Planned: LMA  Additional Equipment:   Intra-op Plan:   Post-operative Plan: Extubation in OR  Informed Consent: I have reviewed the patients History and Physical, chart, labs and discussed the procedure including the risks, benefits and alternatives for the proposed anesthesia with the patient or authorized representative who has indicated his/her understanding and acceptance.     Dental advisory given  Plan Discussed with: CRNA  Anesthesia Plan Comments:        Anesthesia Quick Evaluation

## 2022-12-18 ENCOUNTER — Telehealth: Payer: Self-pay

## 2022-12-18 NOTE — Telephone Encounter (Signed)
Spoke with [name] this morning. She states she is eating, drinking and urinating well. She has had a BM and is passing gas. She is taking senokot as prescribed and encouraged her to drink plenty of water. She denies fever or chills.  She rates her pain 2-3/10. Her pain is controlled with Not really taking anything    Instructed to call office with any fever, chills, purulent drainage, uncontrolled pain or any other questions or concerns. Patient verbalizes understanding.   Pt aware of post op appointments as well as the office number [phone] and after hours number [phone] to call if she has any questions or concerns

## 2022-12-18 NOTE — Telephone Encounter (Signed)
Patient was called to see how she was doing after surgery. Patient states she is doing really good, she also states she doesn't have any complaints or concerns.

## 2022-12-19 ENCOUNTER — Encounter (HOSPITAL_BASED_OUTPATIENT_CLINIC_OR_DEPARTMENT_OTHER): Payer: Self-pay | Admitting: Psychiatry

## 2022-12-19 LAB — SURGICAL PATHOLOGY

## 2022-12-30 ENCOUNTER — Inpatient Hospital Stay: Payer: No Typology Code available for payment source

## 2022-12-30 ENCOUNTER — Other Ambulatory Visit: Payer: Self-pay

## 2022-12-30 ENCOUNTER — Inpatient Hospital Stay: Payer: No Typology Code available for payment source | Attending: Psychiatry | Admitting: Psychiatry

## 2022-12-30 VITALS — BP 119/75 | HR 64 | Temp 98.1°F | Resp 14 | Wt 188.4 lb

## 2022-12-30 DIAGNOSIS — Z79890 Hormone replacement therapy (postmenopausal): Secondary | ICD-10-CM | POA: Diagnosis not present

## 2022-12-30 DIAGNOSIS — Z923 Personal history of irradiation: Secondary | ICD-10-CM

## 2022-12-30 DIAGNOSIS — D072 Carcinoma in situ of vagina: Secondary | ICD-10-CM | POA: Insufficient documentation

## 2022-12-30 DIAGNOSIS — D069 Carcinoma in situ of cervix, unspecified: Secondary | ICD-10-CM | POA: Insufficient documentation

## 2022-12-30 DIAGNOSIS — N8110 Cystocele, unspecified: Secondary | ICD-10-CM | POA: Insufficient documentation

## 2022-12-30 DIAGNOSIS — R350 Frequency of micturition: Secondary | ICD-10-CM | POA: Diagnosis not present

## 2022-12-30 DIAGNOSIS — N893 Dysplasia of vagina, unspecified: Secondary | ICD-10-CM

## 2022-12-30 DIAGNOSIS — N879 Dysplasia of cervix uteri, unspecified: Secondary | ICD-10-CM | POA: Diagnosis not present

## 2022-12-30 NOTE — Progress Notes (Unsigned)
Gynecologic Oncology Return Clinic Visit  Date of Service: 12/30/2022 Referring Provider: Sumner Boast, MD  Assessment & Plan: [name] is a 57 y.o. woman with Stage *** who is *** weeks s/p *** on ***.  Postop: - Pt recovering well from surgery and healing appropriately postoperatively - Intraoperative findings and pathology results reviewed. - Ongoing postoperative expectations and precautions reviewed. Continue with no lifting >10lbs through 6 weeks postoperatively - Pt works ***. Okay to return to work at [employer] - Given that uterus is in situ, pt advised that she should continue with pap smear screening per routine until age 22 if she continues with negative/low grade paps.  - Reviewed that after 12 months without menstrual cycles, she should not have any spotting or bleeding.  If this were to occur, she should be evaluated for postmenopausal bleeding.  ***  ***VTE Prophylaxis: - [place] score = ***  RTC ***.  Bernadene Bell, MD Gynecologic Oncology   Medical Decision Making I personally spent  TOTAL *** minutes face-to-face and non-face-to-face in the care of this patient, which includes all pre, intra, and post visit time on the date of service.  *** minutes spent reviewing records prior to the visit *** Minutes in patient contact      *** minutes in other billable services *** minutes charting , conferring with consultants etc.   ----------------------- Reason for Visit: Postop/Treatment counseling  Treatment History: - 05/07/2022 - Pap: ASCUS-H, HPV high risk detected - 05/28/2022 - Colposcopy: 10:00 biopsy HSIL, ECC HSIL - 07/02/2022 - LEEP and laser of cervical and vaginal dysplasia: Decreased Lugol's uptake of the cervix in the upper vagina.  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  Interval History: Pt reports that she is recovering well from surgery. She is not having pain. She is eating and drinking well. She is voiding without issue and having regular bowel movements.   She reports that she has resumed her vaginal estrogen 2x per week.  Past Medical/Surgical History: Past Medical History:  Diagnosis Date   Anticoagulant long-term use    eliquis--  managed by dr Benay Spice and pcp   Cancer of colon with rectum [facility]) 12/2010   oncologist--- dr Benay Spice;  dx whille pt in hospital had work-up with positive PE started coumadin GI bleed colonscopy positive polyp;  IVC placed prior to low anterior resection of rectum 01-31-2011; Stage III,  completed chemoradiation 09/ 2012   History of cancer chemotherapy    completed 09/ 2012   History of cervical dysplasia    History of COVID-19 03/10/2020   positive covid then admitted 03-21-2020 with increased sob , dx pneumonia MRI 03-22-2020  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 Johney Frame ;   resolved per cardiology lov 04-03-2022;   normal ETT and nuclear stress test 10/ 2007   History of pulmonary embolus (PE) 12/22/2010   in setting taking oral contraceptive;   s/p IVC filter placed 01-24-2011 prior to colon surgery;   03-23-2023  IVC thrombosis in setting covid complicated thrombectomy with tear with removal s/p  stenting   History of radiation therapy    03-19-2011 to 04-30-2011 for rectal cancer   History of vaginal dysplasia    Hyperlipidemia, mixed    Hypothyroidism    folllowed by dr balan   IDA (iron deficiency anemia)    Migraines    Type 2 diabetes mellitus ([place])    followed by dr Chalmers Cater   (12-13-2022   per pt checks blood sugar bid ,  fasting average 122-133)   Urgency of urination    VIN III (vulvar intraepithelial neoplasia III)    Wears glasses     Past Surgical History:  Procedure Laterality Date   AORTA - BILATERAL FEMORAL ARTERY BYPASS GRAFT N/A 03/29/2020   Procedure: VENO VENOUS BYPASS;  Surgeon: Waynetta Sandy, MD;  Location: [place];  Service: Vascular;  Laterality: N/A;   APPLICATION OF [place] N/A 03/29/2020   Procedure: [place];  Surgeon: Waynetta Sandy, MD;  Location: [place];  Service: Vascular;  Laterality: N/A;   CESAREAN SECTION  07/25/2004   '@[facility]'$    CO2 LASER APPLICATION N/A [id number]   Procedure: CO2 LASER APPLICATION of distal cervix and upper vagina;  Surgeon: Salvadore Dom, MD;  Location: [facility];  Service: Gynecology;  Laterality: N/A;   ENDOVASCULAR STENT INSERTION N/A 03/29/2020   Procedure: Inferior Vena Cava WallStent Graft Insertion;  Surgeon: Waynetta Sandy, MD;  Location: [place];  Service: Vascular;  Laterality: N/A;   INSERTION OF VENA CAVA FILTER Right 02/22/2011   by dr Bridgett Larsson   LEEP N/A 07/02/2022   Procedure: [place] (LEEP) and colposcopy;  Surgeon: Salvadore Dom, MD;  Location: [facility];  Service: Gynecology;  Laterality: N/A;   LOW ANTERIOR BOWEL RESECTION  01/31/2011   '@[facility]'$  by dr Barkley Bruns;  of rectum   OPERATIVE ULTRASOUND N/A 12/17/2022   Procedure: OPERATIVE ULTRASOUND;  Surgeon: Bernadene Bell, MD;  Location: [place];  Service: Gynecology;  Laterality: N/A;  Dr.Harout Scheurich ran [place]   PORT-A-CATH REMOVAL Right 12/22/2012   Procedure: REMOVAL PORT-A-CATH;  Surgeon: Odis Hollingshead, MD;  Location[facility] [place];  Service: General;  Laterality: Right;   PORTACATH PLACEMENT  02/21/2011   dr Barkley Bruns   THROMBECTOMY ILIAC ARTERY N/A 03/29/2020   Procedure: THROMBECTOMY ILIAC ARTERY  MECHANICAL THROMBECTOMY;  Surgeon: Waynetta Sandy, MD;  Location: [facility];  Service: Vascular;  Laterality: N/A;    Family History  Problem Relation Age of Onset   Lung cancer Mother    Diabetes Mother    Hypertension Mother    Heart disease Father    Diabetes Brother    Kidney disease Brother    Cervical cancer Paternal Grandmother    Breast cancer Neg Hx    Colon cancer Neg Hx    Ovarian cancer Neg Hx    Endometrial cancer Neg Hx    Pancreatic cancer Neg Hx    Prostate cancer Neg Hx     Social History   Socioeconomic History   Marital status: Married    Spouse name: Not on file   Number of children: 1   Years of education: Not on file   Highest education level: Not on file  Occupational History   Occupation: Engineer, structural  Tobacco Use   Smoking status: Never   Smokeless tobacco: Never  Vaping Use   Vaping Use: Never used  Substance and Sexual Activity   Alcohol use: Not Currently    Comment: rarely  Drug use: Never   Sexual activity: Not on file  Other Topics Concern   Not on file  Social History Narrative   Not on file   Social Determinants of Health   Financial Resource Strain: Not on file  Food Insecurity: No Food Insecurity (11/21/2022)   Hunger Vital Sign    Worried About Running Out of Food in the Last Year: Never true    [place] in the Last Year: Never true  Transportation Needs: No Transportation Needs (11/21/2022)   PRAPARE - Hydrologist (Medical): No    Lack of Transportation (Non-Medical): No  Physical Activity: Not on file  Stress: Not on file  Social Connections: Moderately Integrated (11/21/2022)   Social Connection and Isolation Panel [NHANES]    Frequency of Communication with Friends and Family: More than three times a week    Frequency of Social Gatherings with Friends and Family: Not on file    Attends Religious Services: More than 4 times per year    Active Member of [employer] or Organizations:  No    Attends Music therapist: Not on file    Marital Status: Married    Current Medications:  Current Outpatient Medications:    Accu-Chek Charity fundraiser, twice a day for 30 days, Disp: , Rfl:    apixaban (ELIQUIS) 5 MG TABS tablet, TAKE 1 TABLET(5 MG) BY MOUTH TWICE DAILY, Disp: 180 tablet, Rfl: 3   Aspirin-Acetaminophen-Caffeine (EXCEDRIN MIGRAINE PO), Take by mouth as needed., Disp: , Rfl:    Blood Glucose Monitoring Suppl (ACCU-CHEK GUIDE) w/Device KIT, as directed twice a day for 30 days, Disp: , Rfl:    cholecalciferol (VITAMIN D) 400 UNITS TABS tablet, Take 400 Units by mouth daily. , Disp: , Rfl:    cyanocobalamin 500 MCG tablet, Take 500 mcg by mouth daily. Vitamin b 12, Disp: , Rfl:    estradiol (ESTRACE) 0.1 MG/GM vaginal cream, S: insert 1 gram intravaginally qhs x 1 week, then insert 1 gram 2 x a week at hs., Disp: 42.5 g, Rfl: 1   Ferrous Sulfate (IRON) 325 (65 Fe) MG TABS, Take 1 tablet by mouth daily., Disp: , Rfl:    Glucagon (GVOKE HYPOPEN 2-PACK) 0.5 MG/0.1ML SOAJ, Inject 0.5 mg into the skin as needed., Disp: 0.2 mL, Rfl: 3   glucose blood (ACCU-CHEK GUIDE) test strip, as directed In Vitro twice a day for 30 days, Disp: , Rfl:    Insulin Lispro (HUMALOG [place]), Inject 10 Units into the skin 3 (three) times daily with meals. , Disp: , Rfl:    JARDIANCE 25 MG TABS tablet, Take 25 mg by mouth daily., Disp: , Rfl:    levothyroxine (SYNTHROID, LEVOTHROID) 100 MCG tablet, Take 100 mcg by mouth daily before breakfast., Disp: , Rfl:    Magnesium 500 MG TABS, Take 1 tablet by mouth at bedtime., Disp: , Rfl:    MOUNJARO 5 MG/0.5ML Pen, Inject 5 mg into the skin once a week. Wednesday's, Disp: , Rfl:    Multiple Vitamin (MULTIVITAMIN) tablet, Take 1 tablet by mouth daily., Disp: , Rfl:    Potassium 99 MG TABS, Take 1 tablet by mouth daily., Disp: , Rfl:    rosuvastatin (CRESTOR) 10 MG tablet, Take 10 mg by mouth daily., Disp: , Rfl:    Zinc 100 MG TABS, Take 1  tablet by mouth daily., Disp: , Rfl:   Review of Symptoms: Complete 10-system review is positive for urinary frequency.  Physical Exam: BP 119/75 (BP Location: Left Arm, Patient Position: Sitting)   Pulse 64   Temp 98.1 F (36.7 C) (Oral)   Resp 14   Wt 188 lb 6.4 oz (85.5 kg)   LMP 09/12/2011   SpO2 99%   BMI 26.28 kg/m  General: Alert, oriented, no acute distress. HEENT: Normocephalic, atraumatic. Neck symmetric without masses. Sclera anicteric.  Chest: Normal work of breathing. Clear to auscultation bilaterally.   Cardiovascular: Regular rate and rhythm, no murmurs. Extremities: Grossly normal range of motion.  Warm, well perfused.  No edema bilaterally. Skin: No rashes or lesions noted. GU: Normal appearing external genitalia without erythema, excoriation, or lesions.  Speculum exam reveals anterior vaginal wall prolapse. At vaginal apex, friability of left vaginal fornix and proximal left vaginal sidewall with superficial necrotic appearing tissue. Cervix not clearly visualized, but appears flush with vagina. Bimanual exam reveals cervi flush with vagina. No nodularity. Exam chaperoned by Joylene John, NP  VAGINAL BIOPSY [name] is a 64 y.o. woman who presents today for a vulvar biopsy, see details of the visit above.  The procedure was explained to the patient and verbal consent was obtained prior to the procedure.  The skin was cleaned with Betadine.  *** ml of 1% lidocaine was injected at the planned biopsy site.  A *** mm punch biopsy was used to obtain the biopsy specimen.  Hemostasis was achieved with silver nitrate.  Patient tolerated the procedure well. ***  Laboratory & Radiologic Studies:  FINAL MICROSCOPIC DIAGNOSIS:   A. VAGINA, RIGHT APEX, BIOPSY:  - Koilocytic atypia, consistent with low-grade squamous intraepithelial  lesion (CIN1, low-grade dysplasia)   B. CERVIX, 6 O'CLOCK, BIOPSY:  - Koilocytic atypia, consistent with low-grade squamous  intraepithelial  lesion (CIN1, low-grade dysplasia)

## 2022-12-30 NOTE — Patient Instructions (Signed)
It was a pleasure to see you in clinic today. - Increase the estrogen to nightly x2 weeks. Then you can decrease to 3 nights a week. - Return visit planned for 1 month.  Thank you very much for allowing me to provide care for you today.  I appreciate your confidence in choosing our Gynecologic Oncology team at [facility].  If you have any questions about your visit today please call our office or send [place] a MyChart message and we will get back to you as soon as possible.

## 2023-01-01 ENCOUNTER — Encounter: Payer: Self-pay | Admitting: Psychiatry

## 2023-01-04 LAB — AEROBIC/ANAEROBIC CULTURE W GRAM STAIN (SURGICAL/DEEP WOUND): Gram Stain: NONE SEEN

## 2023-01-07 ENCOUNTER — Other Ambulatory Visit: Payer: Self-pay | Admitting: Psychiatry

## 2023-01-07 ENCOUNTER — Telehealth: Payer: Self-pay

## 2023-01-07 DIAGNOSIS — L03818 Cellulitis of other sites: Secondary | ICD-10-CM

## 2023-01-07 MED ORDER — LEVOFLOXACIN 750 MG PO TABS: 750.0000 mg | ORAL_TABLET | Freq: Every day | ORAL | 0 refills | Status: AC

## 2023-01-07 NOTE — Telephone Encounter (Signed)
-----   Message from Bernadene Bell, MD sent at 01/07/2023 10:41 AM EDT ----- Can you please call this pt and let her know I have sent an antibiotic in to her pharmacy (levo daily for 5 days) based on her tissue culture results?  Thanks, Ailene Ravel  ----- Message ----- From: Buel Ream, Lab [facility] [place] Sent: 12/30/2022   8:23 PM EDT To: Bernadene Bell, MD

## 2023-01-07 NOTE — Progress Notes (Signed)
Based on tissue culture, prescription sent for levofloxacin 750mg  daily x5d.

## 2023-01-07 NOTE — Telephone Encounter (Signed)
LVM for patient to call office regarding recent biopsy results

## 2023-01-08 NOTE — Telephone Encounter (Signed)
Pt is aware of biopsy results as documented by Dr.Newton. Aware of antibiotic sent to her pharmacy., She will pick up today and will call office with any questions/concerns

## 2023-01-27 ENCOUNTER — Inpatient Hospital Stay: Payer: No Typology Code available for payment source | Attending: Psychiatry | Admitting: Psychiatry

## 2023-01-27 VITALS — BP 128/84 | HR 69 | Temp 98.9°F | Wt 190.4 lb

## 2023-01-27 DIAGNOSIS — D072 Carcinoma in situ of vagina: Secondary | ICD-10-CM | POA: Diagnosis present

## 2023-01-27 DIAGNOSIS — N879 Dysplasia of cervix uteri, unspecified: Secondary | ICD-10-CM

## 2023-01-27 DIAGNOSIS — D069 Carcinoma in situ of cervix, unspecified: Secondary | ICD-10-CM

## 2023-01-27 DIAGNOSIS — N893 Dysplasia of vagina, unspecified: Secondary | ICD-10-CM

## 2023-01-27 NOTE — Patient Instructions (Signed)
It was a pleasure to see you in clinic today. - Continue with the vaginal estrogen 3 nights weekly. Let us know if you need any more refills.  - Return visit planned for August. Call us at the end of May to get it scheduled.  Thank you very much for allowing me to provide care for you today.  I appreciate your confidence in choosing our Gynecologic Oncology team at [facility].  If you have any questions about your visit today please call our office or send [place] a MyChart message and we will get back to you as soon as possible.

## 2023-01-27 NOTE — Progress Notes (Unsigned)
Gynecologic Oncology Return Clinic Visit  Date of Service: 01/27/2023 Referring Provider: Gertie Exon, MD  Assessment & Plan: [name] is a 57 y.o. 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  CIN3/VAIN3: - Repeat exam today shows improved healing but persistent agglutination at vaginal apex with flush cervix. - Completed treatment with levofloxacin with good effect. - Given poor wound healing and low grade biopsies would recommend holding off on vaginal 5FU at this time. Vaginal estrogen for now 3x weekly. Continue.  - Given no high grade changes visually on exam in OR and grade 1 changes on biopsies, plan close observation with repeat pap at 6 months (~05/2023). - Pt's cervix is now flush and so any additional excisional procedures for the cervix would not be feasible. Concerns regarding vaginal cuff healing if hysterectomy sought. Will address as needed in the future.  RTC 3-4 month.  Clide Cliff, MD Gynecologic Oncology   Medical Decision Making I personally spent  TOTAL 20 minutes face-to-face and non-face-to-face in the care of this patient, which includes all pre, intra, and post visit time on the date of service.   ----------------------- Reason for Visit: Postop/Treatment counseling  Treatment History: - 05/07/2022 - Pap: ASCUS-H, HPV high risk detected - 05/28/2022 - Colposcopy: 10:00 biopsy HSIL, ECC HSIL - 07/02/2022 - LEEP and laser of cervical and vaginal dysplasia: Decreased Lugol's uptake of the cervix in the upper vagina.  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  Interval History: Pt reports that she was  able to take the levofloxacin. She notes that her arms/legs felt "prickly" when she took it but this has since resolved. She has been using the vaginal estrogen but she ran out and needs to pick up the refill. Otherwise not additional concerns today.    Past Medical/Surgical History: Past Medical History:  Diagnosis Date   Anticoagulant long-term use    eliquis--  managed by dr Truett Perna and pcp   Cancer of colon with rectum [facility]) 12/2010   oncologist--- dr Truett Perna;  dx whille pt in hospital had work-up with positive PE started coumadin GI bleed colonscopy positive polyp;  IVC placed prior to low anterior resection of rectum 01-31-2011; Stage III,  completed chemoradiation 09/ 2012   History of cancer chemotherapy    completed 09/ 2012   History of cervical dysplasia    History of COVID-19 03/10/2020   positive covid then admitted 03-21-2020 with increased sob , dx pneumonia MRI 03-22-2020  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 with tear with removal s/p stenting   History of radiation therapy    03-19-2011 to 04-30-2011 for rectal cancer   History of vaginal dysplasia  Hyperlipidemia, mixed    Hypothyroidism    folllowed by dr balan   IDA (iron deficiency anemia)    Migraines    Type 2 diabetes mellitus (HCC)    followed by dr Talmage Nap   (12-13-2022  per pt checks blood sugar bid ,  fasting average 122-133)   Urgency of urination    VIN III (vulvar intraepithelial  neoplasia III)    Wears glasses     Past Surgical History:  Procedure Laterality Date   AORTA - BILATERAL FEMORAL ARTERY BYPASS GRAFT N/A 03/29/2020   Procedure: VENO VENOUS BYPASS;  Surgeon: Maeola Harman, MD;  Location: [facility] OR;  Service: Vascular;  Laterality: N/A;   APPLICATION OF ANGIOVAC N/A 03/29/2020   Procedure: [facility] MECHANICAL THROMBECTOMY;  Surgeon: Maeola Harman, MD;  Location: [facility] OR;  Service: Vascular;  Laterality: N/A;   CESAREAN SECTION  07/25/2004   @[facility]    CO2 LASER APPLICATION N/A 07/02/2022   Procedure: CO2 LASER APPLICATION of distal cervix and upper vagina;  Surgeon: Romualdo Bolk, MD;  Location: [facility];  Service: Gynecology;  Laterality: N/A;   ENDOVASCULAR STENT INSERTION N/A 03/29/2020   Procedure: Inferior Vena Cava WallStent Graft Insertion;  Surgeon: Maeola Harman, MD;  Location: [facility] OR;  Service: Vascular;  Laterality: N/A;   INSERTION OF VENA CAVA FILTER Right 02/22/2011   by dr Imogene Burn   LEEP N/A 07/02/2022   Procedure: LOOP ELECTROSURGICAL EXCISION PROCEDURE (LEEP) and colposcopy;  Surgeon: Romualdo Bolk, MD;  Location: [facility];  Service: Gynecology;  Laterality: N/A;   LOW ANTERIOR BOWEL RESECTION  01/31/2011   @[facility]  by dr Purnell Shoemaker;  of rectum   OPERATIVE ULTRASOUND N/A 12/17/2022   Procedure: OPERATIVE ULTRASOUND;  Surgeon: Clide Cliff, MD;  Location: [facility];  Service: Gynecology;  Laterality: N/A;  Dr.Laasia Arcos ran [place]   PORT-A-CATH REMOVAL Right 12/22/2012   Procedure: REMOVAL PORT-A-CATH;  Surgeon: Adolph Pollack, MD;  Location: [facility];  Service: General;  Laterality: Right;   PORTACATH PLACEMENT  02/21/2011   dr Purnell Shoemaker   THROMBECTOMY ILIAC ARTERY N/A 03/29/2020   Procedure: THROMBECTOMY ILIAC ARTERY MECHANICAL THROMBECTOMY;  Surgeon: Maeola Harman, MD;  Location: [facility] OR;  Service: Vascular;  Laterality: N/A;     Family History  Problem Relation Age of Onset   Lung cancer Mother    Diabetes Mother    Hypertension Mother    Heart disease Father    Diabetes Brother    Kidney disease Brother    Cervical cancer Paternal Grandmother    Breast cancer Neg Hx    Colon cancer Neg Hx    Ovarian cancer Neg Hx    Endometrial cancer Neg Hx    Pancreatic cancer Neg Hx    Prostate cancer Neg Hx     Social History   Socioeconomic History   Marital status: Married    Spouse name: Not on file   Number of children: 1   Years of education: Not on file   Highest education level: Not on file  Occupational History   Occupation: Engineering geologist  Tobacco Use   Smoking status: Never   Smokeless tobacco: Never  Vaping Use   Vaping Use: Never used  Substance and Sexual Activity   Alcohol use: Not Currently    Comment: rarely   Drug use: Never   Sexual activity: Not on file  Other Topics Concern   Not on file  Social History Narrative  Not on file   Social Determinants of Health   Financial Resource Strain: Not on file  Food Insecurity: No Food Insecurity (11/21/2022)   Hunger Vital Sign    Worried About Running Out of Food in the Last Year: Never true    Ran Out of Food in the Last Year: Never true  Transportation Needs: No Transportation Needs (11/21/2022)   PRAPARE - Administrator, Civil Service (Medical): No    Lack of Transportation (Non-Medical): No  Physical Activity: Not on file  Stress: Not on file  Social Connections: Moderately Integrated (11/21/2022)   Social Connection and Isolation Panel [NHANES]    Frequency of Communication with Friends and Family: More than three times a week    Frequency of Social Gatherings with Friends and Family: Not on file    Attends Religious Services: More than 4 times per year    Active Member of [employer] or Organizations: No    Attends Engineer, structural: Not on file    Marital Status: Married    Current  Medications:  Current Outpatient Medications:    Accu-Chek Comptroller, twice a day for 30 days, Disp: , Rfl:    apixaban (ELIQUIS) 5 MG TABS tablet, TAKE 1 TABLET(5 MG) BY MOUTH TWICE DAILY, Disp: 180 tablet, Rfl: 3   Aspirin-Acetaminophen-Caffeine (EXCEDRIN MIGRAINE PO), Take by mouth as needed., Disp: , Rfl:    Blood Glucose Monitoring Suppl (ACCU-CHEK GUIDE) w/Device KIT, as directed twice a day for 30 days, Disp: , Rfl:    cholecalciferol (VITAMIN D) 400 UNITS TABS tablet, Take 400 Units by mouth daily. , Disp: , Rfl:    cyanocobalamin 500 MCG tablet, Take 500 mcg by mouth daily. Vitamin b 12, Disp: , Rfl:    estradiol (ESTRACE) 0.1 MG/GM vaginal cream, S: insert 1 gram intravaginally qhs x 1 week, then insert 1 gram 2 x a week at hs., Disp: 42.5 g, Rfl: 1   Ferrous Sulfate (IRON) 325 (65 Fe) MG TABS, Take 1 tablet by mouth daily., Disp: , Rfl:    Glucagon (GVOKE HYPOPEN 2-PACK) 0.5 MG/0.1ML SOAJ, Inject 0.5 mg into the skin as needed., Disp: 0.2 mL, Rfl: 3   glucose blood (ACCU-CHEK GUIDE) test strip, as directed In Vitro twice a day for 30 days, Disp: , Rfl:    Insulin Lispro (HUMALOG [place]), Inject 10 Units into the skin 3 (three) times daily with meals. , Disp: , Rfl:    JARDIANCE 25 MG TABS tablet, Take 25 mg by mouth daily., Disp: , Rfl:    levothyroxine (SYNTHROID, LEVOTHROID) 100 MCG tablet, Take 100 mcg by mouth daily before breakfast., Disp: , Rfl:    Magnesium 500 MG TABS, Take 1 tablet by mouth at bedtime., Disp: , Rfl:    MOUNJARO 5 MG/0.5ML Pen, Inject 5 mg into the skin once a week. Wednesday's, Disp: , Rfl:    Multiple Vitamin (MULTIVITAMIN) tablet, Take 1 tablet by mouth daily., Disp: , Rfl:    Potassium 99 MG TABS, Take 1 tablet by mouth daily., Disp: , Rfl:    rosuvastatin (CRESTOR) 10 MG tablet, Take 10 mg by mouth daily., Disp: , Rfl:    Zinc 100 MG TABS, Take 1 tablet by mouth daily., Disp: , Rfl:   Review of Symptoms: Complete 10-system review is positive  for: Bruise on her left arm.  Physical Exam: BP 128/84 (BP Location: Left Arm, Patient Position: Sitting)   Pulse 69   Temp 98.9 F (37.2 C) (  Oral)   Wt 190 lb 6.4 oz (86.4 kg)   LMP 09/12/2011   SpO2 96%   BMI 26.56 kg/m  General: Alert, oriented, no acute distress. HEENT: Normocephalic, atraumatic. Neck symmetric without masses. Sclera anicteric.  Chest: Normal work of breathing.  Extremities: Grossly normal range of motion.  Warm, well perfused.  No edema bilaterally.  Skin: No rashes or lesions noted. Bruise left forearm GU: Normal appearing external genitalia without erythema, excoriation, or lesions.  Speculum exam reveals anterior vaginal wall prolapse. Agglutination at vaginal apex, atrophic. Friability much improved. Cervix not clearly visualized but appears flush with the vagina. Bimanual exam reveals cervi flush with vagina. No nodularity. Exam chaperoned by Kimberly SwazilandJordan, CMA  Laboratory & Radiologic Studies: Vaginal tissue culture (12/30/22): RARE KLEBSIELLA PNEUMONIAE  VIRIDANS STREPTOCOCCUS  NO ANAEROBES ISOLATED

## 2023-01-29 ENCOUNTER — Encounter: Payer: Self-pay | Admitting: Psychiatry

## 2023-01-31 LAB — FUNGUS CULTURE WITH STAIN

## 2023-01-31 LAB — FUNGAL ORGANISM REFLEX

## 2023-01-31 LAB — FUNGUS CULTURE RESULT

## 2023-02-06 ENCOUNTER — Inpatient Hospital Stay: Payer: No Typology Code available for payment source

## 2023-02-06 ENCOUNTER — Inpatient Hospital Stay: Payer: No Typology Code available for payment source | Admitting: Oncology

## 2023-02-14 ENCOUNTER — Encounter: Payer: Self-pay | Admitting: Nurse Practitioner

## 2023-02-14 ENCOUNTER — Inpatient Hospital Stay: Payer: No Typology Code available for payment source

## 2023-02-14 ENCOUNTER — Inpatient Hospital Stay: Payer: No Typology Code available for payment source | Admitting: Nurse Practitioner

## 2023-02-14 VITALS — BP 127/82 | HR 70 | Temp 98.1°F | Resp 18 | Ht 71.0 in | Wt 187.0 lb

## 2023-02-14 DIAGNOSIS — I82220 Acute embolism and thrombosis of inferior vena cava: Secondary | ICD-10-CM | POA: Diagnosis not present

## 2023-02-14 DIAGNOSIS — D069 Carcinoma in situ of cervix, unspecified: Secondary | ICD-10-CM | POA: Diagnosis not present

## 2023-02-14 DIAGNOSIS — I2699 Other pulmonary embolism without acute cor pulmonale: Secondary | ICD-10-CM | POA: Diagnosis not present

## 2023-02-14 DIAGNOSIS — C20 Malignant neoplasm of rectum: Secondary | ICD-10-CM | POA: Diagnosis not present

## 2023-02-14 LAB — CBC WITH DIFFERENTIAL (CANCER CENTER ONLY)
Abs Immature Granulocytes: 0 10*3/uL (ref 0.00–0.07)
Basophils Absolute: 0 10*3/uL (ref 0.0–0.1)
Basophils Relative: 1 %
Eosinophils Absolute: 0 10*3/uL (ref 0.0–0.5)
Eosinophils Relative: 1 %
HCT: 44.1 % (ref 36.0–46.0)
Hemoglobin: 14.6 g/dL (ref 12.0–15.0)
Immature Granulocytes: 0 %
Lymphocytes Relative: 34 %
Lymphs Abs: 1.3 10*3/uL (ref 0.7–4.0)
MCH: 29.7 pg (ref 26.0–34.0)
MCHC: 33.1 g/dL (ref 30.0–36.0)
MCV: 89.8 fL (ref 80.0–100.0)
Monocytes Absolute: 0.3 10*3/uL (ref 0.1–1.0)
Monocytes Relative: 9 %
Neutro Abs: 2.1 10*3/uL (ref 1.7–7.7)
Neutrophils Relative %: 55 %
Platelet Count: 206 10*3/uL (ref 150–400)
RBC: 4.91 MIL/uL (ref 3.87–5.11)
RDW: 12.4 % (ref 11.5–15.5)
WBC Count: 3.8 10*3/uL — ABNORMAL LOW (ref 4.0–10.5)
nRBC: 0 % (ref 0.0–0.2)

## 2023-02-14 NOTE — Progress Notes (Signed)
[name]   Diagnosis: Rectal cancer, history of venous thrombosis  INTERVAL HISTORY:   [name] returns for follow-up.  Overall feeling well.  She continues Eliquis.  No bleeding.  No change in baseline bowel habits.  Objective:  Vital signs in last 24 hours:  Blood pressure 127/82, pulse 70, temperature 98.1 F (36.7 C), temperature source Oral, resp. rate 18, height 5\' 11"  (1.803 m), weight 187 lb (84.8 kg), last menstrual period 09/12/2011, SpO2 100 %.    HEENT: No thrush or ulcers. Lymphatics: No palpable cervical, supraclavicular, axillary or inguinal lymph nodes. Resp: Lungs clear bilaterally. Cardio: Regular rate and rhythm. GI: No hepatosplenomegaly. Vascular: No leg edema.   Lab Results:  Lab Results  Component Value Date   WBC 3.8 (L) 02/14/2023   HGB 14.6 02/14/2023   HCT 44.1 02/14/2023   MCV 89.8 02/14/2023   PLT 206 02/14/2023   NEUTROABS 2.1 02/14/2023    Imaging:  No results found.  Medications: I have reviewed the patient's current medications.  Assessment/Plan: Rectal cancer, stage III (T3 N1b), status post a low anterior resection 01/31/2011, status post radiation with continuous infusion 5-FU 03/19/2011 through 04/30/2011. Status post FOLFOX beginning 07/03/2011 for 8 cycles. Surveillance colonoscopy 02/19/2012-negative for recurrent cancer or polyps. Surveillance colonoscopy 04/15/2014-entire examined colon normal; terminal ileum appeared normal. Next colonoscopy recommended at a 5 year interval. Surveillance CT scans negative 12/17/2013 Pulmonary embolism March 2012, status post IVC filter placement prior to cancer surgery in April 2012, followed by Dr. Marchelle Gearing. Risk factors for pulmonary embolism felt to be related to obesity, oral contraceptive use, rectal cancer, and sedentary job.  Coumadin discontinued 2014, aspirin initiated Removal of Port-A-Cath March 2014 Urinary/fecal incontinence. Initially  improved with the pelvic physical therapy program. Diagnosed with Covid 03/10/2020 Hospitalized 03/21/2020 with fatigue/malaise, low back pain.  She was found to have multifocal patchy airspace opacities throughout both lungs consistent with multifocal pneumonia on CT 03/21/2020. MRI of the thoracic/lumbar spine 03/22/2020 showed T7 disc herniation but also showed thrombosis of the IVC and both iliac veins to the level of the IVC filter.  Subsequently Doppler study showed extensive DVT in bilateral lower extremity.  She was started on IV heparin.  Anticoagulation changed to argatroban due to thrombocytopenia, HIT antibody negative 03/30/2020.  The platelet count improved.  Vascular surgery was consulted.  She underwent catheter guided lytic therapy 03/28/2020, mechanical thrombectomy, IVC filter removal which was complicated by IVC tear requiring IVC stenting 03/29/2020.  Postoperatively she developed acute blood loss anemia with hypovolemic shock, required pressors.  She stabilized and was transferred to the medical unit then developed vomiting and was ultimately found to have an ileus/partial small bowel obstruction.  She was transitioned to Eliquis which she continues. 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 Leukopenia/mild neutropenia-chronic  Disposition: [name] remains in clinical remission from rectal cancer.  She continues colonoscopy surveillance with Dr. Loreta Ave, next due in approximately 1 year.  CBC shows stable mild leukopenia, likely a benign normal variant.  She continues Eliquis following IVC thrombosis/bilateral lower extremity DVT June 2021. She has an IVC stent in place.  Dr. Truett Perna recommends a referral to Dr. Isaiah Serge at [facility] regarding duration of anticoagulation.  [name] agrees with this plan.  She will return for lab and follow-up  in 6 months.    [name] ANP/GNP-BC   02/14/2023  3:06  PM        

## 2023-02-17 ENCOUNTER — Encounter: Payer: Self-pay | Admitting: *Deleted

## 2023-02-17 NOTE — Progress Notes (Signed)
Referral faxed to Dr Cherie Dark at [facility] at [phone] for evaluation of duration of anticoagulation in regard to h/o IVC thrombosis and h/o bilateral LE DVT

## 2023-02-21 ENCOUNTER — Telehealth: Payer: Self-pay | Admitting: Oncology

## 2023-02-21 NOTE — Telephone Encounter (Signed)
Spoke with patient confirming upcoming appointments  

## 2023-03-03 ENCOUNTER — Encounter: Payer: Self-pay | Admitting: *Deleted

## 2023-03-03 NOTE — Progress Notes (Signed)
Contacted Dr Shirlee More office at [facility] and pt has an appt on 05/26/23 at 3:30

## 2023-03-04 ENCOUNTER — Other Ambulatory Visit: Payer: Self-pay

## 2023-03-04 MED ORDER — ESTRADIOL 0.1 MG/GM VA CREA: TOPICAL_CREAM | VAGINAL | 0 refills | Status: DC

## 2023-03-04 NOTE — Telephone Encounter (Signed)
Medication refill request: estradiol cream  Last AEX:  05/07/22 Next AEX: none scheduled at this time.  Last MMG (if hormonal medication request): 01/21/22 Refill authorized: 42.5g with 0 rf

## 2023-03-06 ENCOUNTER — Telehealth: Payer: Self-pay

## 2023-03-06 NOTE — Telephone Encounter (Signed)
Second RF request received from [facility] for Estradiol 0.01% cream.  RF was sent electronically 03/04/23, however RF did not process.  Pharmacist contacted by phone and RF given for Estradiol 0.01% vaginal cream 42.5 grams, insert 1 gram intravaginally three times per week at bedtime.   [facility] phone # [phone].

## 2023-04-21 ENCOUNTER — Ambulatory Visit: Payer: No Typology Code available for payment source | Admitting: Psychiatry

## 2023-05-20 ENCOUNTER — Other Ambulatory Visit: Payer: Self-pay

## 2023-05-22 ENCOUNTER — Other Ambulatory Visit: Payer: Self-pay

## 2023-05-22 DIAGNOSIS — I82403 Acute embolism and thrombosis of unspecified deep veins of lower extremity, bilateral: Secondary | ICD-10-CM

## 2023-05-22 MED ORDER — APIXABAN 5 MG PO TABS: ORAL_TABLET | ORAL | Status: DC

## 2023-06-26 ENCOUNTER — Other Ambulatory Visit: Payer: Self-pay | Admitting: Nurse Practitioner

## 2023-06-26 DIAGNOSIS — Z1231 Encounter for screening mammogram for malignant neoplasm of breast: Secondary | ICD-10-CM

## 2023-07-02 ENCOUNTER — Ambulatory Visit
Admission: RE | Admit: 2023-07-02 | Discharge: 2023-07-02 | Disposition: A | Discharge status: Home / Self Care | Payer: No Typology Code available for payment source | Source: Ambulatory Visit

## 2023-07-02 DIAGNOSIS — Z1231 Encounter for screening mammogram for malignant neoplasm of breast: Secondary | ICD-10-CM

## 2023-07-21 ENCOUNTER — Encounter: Payer: Self-pay | Admitting: Nurse Practitioner

## 2023-07-21 ENCOUNTER — Ambulatory Visit: Payer: No Typology Code available for payment source | Admitting: Nurse Practitioner

## 2023-07-21 VITALS — BP 124/80 | HR 89 | Temp 98.7°F | Ht 71.0 in | Wt 178.2 lb

## 2023-07-21 DIAGNOSIS — E113293 Type 2 diabetes mellitus with mild nonproliferative diabetic retinopathy without macular edema, bilateral: Secondary | ICD-10-CM

## 2023-07-21 DIAGNOSIS — Z79899 Other long term (current) drug therapy: Secondary | ICD-10-CM

## 2023-07-21 DIAGNOSIS — Z2821 Immunization not carried out because of patient refusal: Secondary | ICD-10-CM

## 2023-07-21 DIAGNOSIS — Z23 Encounter for immunization: Secondary | ICD-10-CM

## 2023-07-21 DIAGNOSIS — E782 Mixed hyperlipidemia: Secondary | ICD-10-CM

## 2023-07-21 DIAGNOSIS — Z0000 Encounter for general adult medical examination without abnormal findings: Secondary | ICD-10-CM | POA: Diagnosis not present

## 2023-07-21 DIAGNOSIS — E039 Hypothyroidism, unspecified: Secondary | ICD-10-CM | POA: Diagnosis not present

## 2023-07-21 DIAGNOSIS — R5383 Other fatigue: Secondary | ICD-10-CM

## 2023-07-21 DIAGNOSIS — E113299 Type 2 diabetes mellitus with mild nonproliferative diabetic retinopathy without macular edema, unspecified eye: Secondary | ICD-10-CM

## 2023-07-21 NOTE — Progress Notes (Addendum)
Madelaine Bhat, CMA,acting as a Neurosurgeon for Arnette Felts, FNP.,have documented all relevant documentation on the behalf of Arnette Felts, FNP,as directed by  Arnette Felts, FNP while in the presence of Arnette Felts, FNP.  Subjective:    Patient ID: [name] , female    DOB: [birth year] , 57 y.o.   MRN: [id number]  Chief Complaint  Patient presents with   Annual Exam    HPI  Patient presents today for HM, Patient reports compliance with medication. Patient denies any chest pain, SOB, or headaches. Patient has no concerns today. She has been to see Hematology in [facility] - decreased her eliquis to 2.5 mg twice a day for 6 months.   BP Readings from Last 3 Encounters: 07/21/23 : 124/80 02/14/23 : 127/82 01/27/23 : 128/84    Diabetes She presents for her follow-up diabetic visit. She has type 2 diabetes mellitus. Pertinent negatives for diabetes include no polydipsia, no polyphagia and no polyuria. There are no hypoglycemic complications. There are no diabetic complications. Risk factors for coronary artery disease include sedentary lifestyle and diabetes mellitus. She is following a generally healthy diet. When asked about meal planning, she reported none. She participates in exercise three times a week. There is no change in her home blood glucose trend. An ACE inhibitor/angiotensin II receptor blocker is not being taken. She does not see a podiatrist.Eye exam is current.     Past Medical History:  Diagnosis Date   Anticoagulant long-term use    eliquis--  managed by dr Truett Perna and pcp   Cancer of colon with rectum 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 with tear with removal s/p stenting   History of radiation therapy    03-19-2011 to 04-30-2011 for rectal cancer   History of vaginal dysplasia    Hyperlipidemia, mixed    Hypothyroidism    folllowed by dr Talmage Nap   IDA (iron deficiency anemia)    Migraines    Type 2 diabetes mellitus (HCC)    followed by dr Talmage Nap   (12-13-2022  per pt checks blood sugar bid ,  fasting average 122-133)   Urgency of urination    VIN III (vulvar intraepithelial neoplasia III)    Wears glasses      Family History  Problem Relation Age of Onset   Lung cancer Mother    Diabetes Mother    Hypertension Mother    Heart disease Father    Diabetes Brother  Kidney disease Brother    Cervical cancer Paternal Grandmother    Breast cancer Neg Hx    Colon cancer Neg Hx    Ovarian cancer Neg Hx    Endometrial cancer Neg Hx    Pancreatic cancer Neg Hx    Prostate cancer Neg Hx      Current Outpatient Medications:    Accu-Chek FastClix Lancets MISC, twice a day for 30 days, Disp: , Rfl:    apixaban (ELIQUIS) 5 MG TABS tablet, TAKE 1 TABLET(5 MG) BY MOUTH TWICE DAILY (Patient taking differently: 2.5 mg. TAKE 1 TABLET(5 MG) BY MOUTH TWICE DAILY), Disp: , Rfl:    Aspirin-Acetaminophen-Caffeine (EXCEDRIN MIGRAINE PO), Take by mouth as needed.,  Disp: , Rfl:    Blood Glucose Monitoring Suppl (ACCU-CHEK GUIDE) w/Device KIT, as directed twice a day for 30 days, Disp: , Rfl:    cholecalciferol (VITAMIN D) 400 UNITS TABS tablet, Take 400 Units by mouth daily. , Disp: , Rfl:    cyanocobalamin 500 MCG tablet, Take 500 mcg by mouth daily. Vitamin b 12, Disp: , Rfl:    estradiol (ESTRACE) 0.1 MG/GM vaginal cream, S: insert 1 gram intravaginally 3 x a week at hs., Disp: 42.5 g, Rfl: 0   Ferrous Sulfate (IRON) 325 (65 Fe) MG TABS, Take 1 tablet by mouth daily., Disp: , Rfl:    Glucagon (GVOKE HYPOPEN 2-PACK) 0.5 MG/0.1ML SOAJ, Inject 0.5 mg into the skin as needed., Disp: 0.2 mL, Rfl: 3   glucose blood (ACCU-CHEK GUIDE) test strip, as directed In Vitro twice a day for 30 days, Disp: , Rfl:    Insulin Lispro (HUMALOG [place]), Inject 10 Units into the skin 3 (three) times daily with meals. , Disp: , Rfl:    JARDIANCE 25 MG TABS tablet, Take 25 mg by mouth daily., Disp: , Rfl:    levothyroxine (SYNTHROID, LEVOTHROID) 100 MCG tablet, Take 100 mcg by mouth daily before breakfast., Disp: , Rfl:    Magnesium 500 MG TABS, Take 1 tablet by mouth at bedtime., Disp: , Rfl:    MOUNJARO 5 MG/0.5ML Pen, Inject 5 mg into the skin once a week. Wednesday's, Disp: , Rfl:    Multiple Vitamin (MULTIVITAMIN) tablet, Take 1 tablet by mouth daily., Disp: , Rfl:    rosuvastatin (CRESTOR) 10 MG tablet, Take 10 mg by mouth daily., Disp: , Rfl:    Zinc 100 MG TABS, Take 1 tablet by mouth daily., Disp: , Rfl:    Potassium 99 MG TABS, Take 1 tablet by mouth daily. (Patient not taking: Reported on 02/14/2023), Disp: , Rfl:    Allergies  Allergen Reactions   Penicillins Hives   Metformin And Related Other (See Comments)    Chest pain   Levofloxacin Other (See Comments)    Parathesia      The patient states she is post menopausal status for birth control. Patient's last menstrual period was 09/12/2011.  Negative for: breast discharge, breast lump(s), breast pain and breast  self exam. Associated symptoms include abnormal vaginal bleeding. Pertinent negatives include abnormal bleeding (hematology), anxiety, decreased libido, depression, difficulty falling sleep, dyspareunia, history of infertility, nocturia, sexual dysfunction, sleep disturbances, urinary incontinence, urinary urgency, vaginal discharge and vaginal itching. Diet regular; due to the [facility] she is not eating as much, she has been taking since earlier this year.  The patient states her exercise level is minimal - she has not been getting in exercise as often as she would like. Was walking.   . The patient's tobacco  use is:  Social History   Tobacco Use  Smoking Status Never  Smokeless Tobacco Never  . She has been exposed to passive smoke. The patient's alcohol use is:  Social History   Substance and Sexual Activity  Alcohol Use Not Currently   Comment: rarely   Review of Systems  Constitutional: Negative.   HENT: Negative.    Eyes: Negative.   Respiratory: Negative.    Cardiovascular: Negative.   Gastrointestinal: Negative.   Endocrine: Negative.  Negative for polydipsia, polyphagia and polyuria.  Genitourinary: Negative.   Musculoskeletal: Negative.   Skin: Negative.   Allergic/Immunologic: Negative.   Neurological: Negative.   Hematological: Negative.   Psychiatric/Behavioral: Negative.       Today's Vitals   07/21/23 1446  BP: 124/80  Pulse: 89  Temp: 98.7 F (37.1 C)  TempSrc: Oral  Weight: 178 lb 3.2 oz (80.8 kg)  Height: 5\' 11"  (1.803 m)  PainSc: 0-No pain   Body mass index is 24.85 kg/m.  Wt Readings from Last 3 Encounters:  07/21/23 178 lb 3.2 oz (80.8 kg)  02/14/23 187 lb (84.8 kg)  01/27/23 190 lb 6.4 oz (86.4 kg)     Objective:  Physical Exam Vitals reviewed.  Constitutional:      General: She is not in acute distress.    Appearance: Normal appearance. She is well-developed and normal weight.  HENT:     Head: Normocephalic and atraumatic.     Right  Ear: Hearing, tympanic membrane, ear canal and external ear normal. There is no impacted cerumen.     Left Ear: Hearing, tympanic membrane, ear canal and external ear normal. There is no impacted cerumen.     Nose: Nose normal.     Mouth/Throat:     Mouth: Mucous membranes are moist.  Eyes:     General: Lids are normal.     Extraocular Movements: Extraocular movements intact.     Conjunctiva/sclera: Conjunctivae normal.     Pupils: Pupils are equal, round, and reactive to light.     Funduscopic exam:    Right eye: No papilledema.        Left eye: No papilledema.  Neck:     Thyroid: No thyroid mass.     Vascular: No carotid bruit.  Cardiovascular:     Rate and Rhythm: Normal rate and regular rhythm.     Pulses: Normal pulses.     Heart sounds: Normal heart sounds. No murmur heard. Pulmonary:     Effort: Pulmonary effort is normal.     Breath sounds: Normal breath sounds.  Chest:     Chest wall: No mass.  Breasts:    Tanner Score is 5.     Right: Normal. No mass or tenderness.     Left: Normal. No mass or tenderness.  Abdominal:     General: Abdomen is flat. Bowel sounds are normal. There is no distension.     Palpations: Abdomen is soft.     Tenderness: There is no abdominal tenderness.  Genitourinary:    Comments: Deferred - managed by Gyn Musculoskeletal:        General: No swelling or tenderness. Normal range of motion.     Cervical back: Full passive range of motion without pain, normal range of motion and neck supple.     Right lower leg: No edema.     Left lower leg: No edema.  Lymphadenopathy:     Upper Body:     Right upper body: No supraclavicular, axillary or  pectoral adenopathy.     Left upper body: No supraclavicular, axillary or pectoral adenopathy.  Skin:    General: Skin is warm and dry.     Capillary Refill: Capillary refill takes less than 2 seconds.  Neurological:     General: No focal deficit present.     Mental Status: She is alert and oriented to  person, place, and time.     Cranial Nerves: No cranial nerve deficit.     Sensory: No sensory deficit.     Motor: No weakness.  Psychiatric:        Mood and Affect: Mood normal.        Behavior: Behavior normal.        Thought Content: Thought content normal.        Judgment: Judgment normal.      Title   Diabetic Foot Exam - detailed Date & Time: 07/21/2023  3:24 PM Diabetic Foot exam was performed with the following findings: Yes  Visual Foot Exam completed.: Yes  Is there a history of foot ulcer?: No Is there a foot ulcer now?: No Is there swelling?: No Is there elevated skin temperature?: No Is there abnormal foot shape?: No Is there a claw toe deformity?: No Are the toenails long?: No Are the toenails thick?: No Are the toenails ingrown?: No Is the skin thin, fragile, shiny and hairless?": No Normal Range of Motion?: Yes Is there foot or ankle muscle weakness?: No Do you have pain in calf while walking?: No Are the shoes appropriate in style and fit?: Yes Can the patient see the bottom of their feet?: Yes Pulse Foot Exam completed.: Yes   Right Posterior Tibialis: Present Left posterior Tibialis: Present   Right Dorsalis Pedis: Present Left Dorsalis Pedis: Present     Sensory Foot Exam Completed.: Yes Semmes-Weinstein Monofilament Test "+" means "has sensation" and "-" means "no sensation"   R Site 1-Great Toe: Pos L Site 1-Great Toe: Pos   R Site 4: Pos L Site 4: Pos   R Site 6: Pos L Site 6: Pos     Image components are not supported.   Image components are not supported. Image components are not supported.  [place] Comments        Assessment And Plan:     Encounter for annual health examination Assessment & Plan: Behavior modifications discussed and diet history reviewed.   Pt will continue to exercise regularly and modify diet with low GI, plant based foods and decrease intake of processed foods.  Recommend intake of daily multivitamin,  Vitamin D, and calcium.  Recommend mammogram and colonoscopy for preventive screenings, as well as recommend immunizations that include influenza, TDAP, and Shingles    Type 2 diabetes mellitus with both eyes affected by mild nonproliferative retinopathy without macular edema, without long-term current use of insulin (HCC) Assessment & Plan: Hemoglobin A1c has been slightly elevated, continue follow-up with endocrinologist.  Diabetic foot exam done normal abnormal findings.  EKG done shows Sinus Rhythm  -RSR(V1) -nondiagnostic.  -Right atrial enlargement.  BORDERLINE  HR 72   Orders: -     EKG 12-Lead -     POCT URINALYSIS DIP (CLINITEK) -     Microalbumin / creatinine urine ratio -     CMP14+EGFR -     Hemoglobin A1c  Hypothyroidism, unspecified type Assessment & Plan: Continue follow-up with endocrinologist.  No recent changes with her thyroid medication.   Mixed hyperlipidemia Assessment & Plan: Cholesterol levels have been normal  at last check.  Continue statin.  Orders: -     CMP14+EGFR -     Lipid panel  Other fatigue Assessment & Plan: Will check for any metabolic causes.  Orders: -     Vitamin B12 -     TSH -     VITAMIN D 25 Hydroxy (Vit-D Deficiency, Fractures)  Influenza vaccination declined Assessment & Plan: Patient declined influenza vaccination at this time. Patient is aware that influenza vaccine prevents illness in 70% of healthy people, and reduces hospitalizations to 30-70% in elderly. This vaccine is recommended annually. Education has been provided regarding the importance of this vaccine but patient still declined. Advised may receive this vaccine at local pharmacy or Health Dept.or vaccine clinic. Aware to provide a copy of the vaccination record if obtained from local pharmacy or Health Dept.  Pt is willing to accept risk associated with refusing vaccination.    COVID-19 vaccination declined Assessment & Plan: Declines covid 19 vaccine.  Discussed risk of covid 73 and if she changes her mind about the vaccine to call the office. Education has been provided regarding the importance of this vaccine but patient still declined. Advised may receive this vaccine at local pharmacy or Health Dept.or vaccine clinic. Aware to provide a copy of the vaccination record if obtained from local pharmacy or Health Dept.  Encouraged to take multivitamin, vitamin d, vitamin c and zinc to increase immune system. Aware can call office if would like to have vaccine here at office. Verbalized acceptance and understanding.    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  Diabetic foot exam done normal abnormal findings.  EKG done shows Sinus Rhythm  -RSR(V1) -nondiagnostic.  -Right atrial enlargement.  BORDERLINE  HR 72    Herpes zoster vaccination declined Assessment & Plan: Was not given during this visit      Return for 1 year physical, controlled DM check 4 months. Patient was given opportunity to ask questions. Patient verbalized understanding of the plan and was able to repeat key elements of the plan. All questions were answered to their satisfaction.   Arnette Felts, FNP  I, Arnette Felts, FNP, have reviewed all documentation for this visit. The documentation on 07/21/23 for the exam, diagnosis, procedures, and orders are all accurate and complete.

## 2023-07-22 LAB — CBC WITH DIFFERENTIAL/PLATELET
Basophils Absolute: 0 10*3/uL (ref 0.0–0.2)
Basos: 1 %
EOS (ABSOLUTE): 0.1 10*3/uL (ref 0.0–0.4)
Eos: 1 %
Hematocrit: 47.5 % — ABNORMAL HIGH (ref 34.0–46.6)
Hemoglobin: 15.1 g/dL (ref 11.1–15.9)
Immature Grans (Abs): 0 10*3/uL (ref 0.0–0.1)
Immature Granulocytes: 0 %
Lymphocytes Absolute: 1.2 10*3/uL (ref 0.7–3.1)
Lymphs: 34 %
MCH: 29.7 pg (ref 26.6–33.0)
MCHC: 31.8 g/dL (ref 31.5–35.7)
MCV: 94 fL (ref 79–97)
Monocytes Absolute: 0.3 10*3/uL (ref 0.1–0.9)
Monocytes: 7 %
Neutrophils Absolute: 2.1 10*3/uL (ref 1.4–7.0)
Neutrophils: 57 %
Platelets: 201 10*3/uL (ref 150–450)
RBC: 5.08 x10E6/uL (ref 3.77–5.28)
RDW: 12.9 % (ref 11.7–15.4)
WBC: 3.7 10*3/uL (ref 3.4–10.8)

## 2023-07-22 LAB — POCT URINALYSIS DIP (CLINITEK)
Bilirubin, UA: NEGATIVE
Blood, UA: NEGATIVE
Glucose, UA: >=1000 mg/dL — AB
Leukocytes, UA: NEGATIVE
Nitrite, UA: NEGATIVE
POC PROTEIN,UA: NEGATIVE
Spec Grav, UA: 1.015 (ref 1.010–1.025)
Urobilinogen, UA: 0.2 U/dL
pH, UA: 5.5 (ref 5.0–8.0)

## 2023-07-22 LAB — CMP14+EGFR
ALT: 27 [IU]/L (ref 0–32)
AST: 18 [IU]/L (ref 0–40)
Albumin: 4.6 g/dL (ref 3.8–4.9)
Alkaline Phosphatase: 115 [IU]/L (ref 44–121)
BUN/Creatinine Ratio: 16 (ref 9–23)
BUN: 16 mg/dL (ref 6–24)
Bilirubin Total: 0.5 mg/dL (ref 0.0–1.2)
CO2: 24 mmol/L (ref 20–29)
Calcium: 10 mg/dL (ref 8.7–10.2)
Chloride: 107 mmol/L — ABNORMAL HIGH (ref 96–106)
Creatinine, Ser: 0.97 mg/dL (ref 0.57–1.00)
Globulin, Total: 2.7 g/dL (ref 1.5–4.5)
Glucose: 109 mg/dL — ABNORMAL HIGH (ref 70–99)
Potassium: 4.9 mmol/L (ref 3.5–5.2)
Sodium: 146 mmol/L — ABNORMAL HIGH (ref 134–144)
Total Protein: 7.3 g/dL (ref 6.0–8.5)
eGFR: 69 mL/min/{1.73_m2} (ref >59)

## 2023-07-22 LAB — HEMOGLOBIN A1C
Est. average glucose Bld gHb Est-mCnc: 143 mg/dL
Hgb A1c MFr Bld: 6.6 % — ABNORMAL HIGH (ref 4.8–5.6)

## 2023-07-22 LAB — LIPID PANEL
Chol/HDL Ratio: 3.1 {ratio} (ref 0.0–4.4)
Cholesterol, Total: 181 mg/dL (ref 100–199)
HDL: 58 mg/dL (ref >39)
LDL Chol Calc (NIH): 97 mg/dL (ref 0–99)
Triglycerides: 153 mg/dL — ABNORMAL HIGH (ref 0–149)
VLDL Cholesterol Cal: 26 mg/dL (ref 5–40)

## 2023-07-22 LAB — VITAMIN D 25 HYDROXY (VIT D DEFICIENCY, FRACTURES): Vit D, 25-Hydroxy: 39.3 ng/mL (ref 30.0–100.0)

## 2023-07-22 LAB — MICROALBUMIN / CREATININE URINE RATIO
Creatinine, Urine: 201.5 mg/dL
Microalb/Creat Ratio: 5 mg/g{creat} (ref 0–29)
Microalbumin, Urine: 10.3 ug/mL

## 2023-07-22 LAB — TSH: TSH: 4.4 u[IU]/mL (ref 0.450–4.500)

## 2023-07-22 LAB — VITAMIN B12: Vitamin B-12: 456 pg/mL (ref 232–1245)

## 2023-08-05 ENCOUNTER — Encounter: Payer: Self-pay | Admitting: Nurse Practitioner

## 2023-08-05 DIAGNOSIS — R5383 Other fatigue: Secondary | ICD-10-CM | POA: Insufficient documentation

## 2023-08-05 DIAGNOSIS — Z0000 Encounter for general adult medical examination without abnormal findings: Secondary | ICD-10-CM | POA: Insufficient documentation

## 2023-08-05 DIAGNOSIS — Z23 Encounter for immunization: Secondary | ICD-10-CM | POA: Insufficient documentation

## 2023-08-05 DIAGNOSIS — Z2821 Immunization not carried out because of patient refusal: Secondary | ICD-10-CM | POA: Insufficient documentation

## 2023-08-05 NOTE — Assessment & Plan Note (Signed)

## 2023-08-05 NOTE — Assessment & Plan Note (Signed)
Continue follow-up with endocrinologist.  No recent changes with her thyroid medication.

## 2023-08-05 NOTE — Assessment & Plan Note (Signed)
Declines covid 19 vaccine. Discussed risk of covid 2 and if she changes her mind about the vaccine to call the office. Education has been provided regarding the importance of this vaccine but patient still declined. Advised may receive this vaccine at local pharmacy or Health Dept.or vaccine clinic. Aware to provide a copy of the vaccination record if obtained from local pharmacy or Health Dept.  Encouraged to take multivitamin, vitamin d, vitamin c and zinc to increase immune system. Aware can call office if would like to have vaccine here at office. Verbalized acceptance and understanding.

## 2023-08-05 NOTE — Assessment & Plan Note (Signed)

## 2023-08-05 NOTE — Assessment & Plan Note (Signed)
Cholesterol levels have been normal at last check.  Continue statin.

## 2023-08-05 NOTE — Assessment & Plan Note (Signed)
Will check for any metabolic causes

## 2023-08-05 NOTE — Assessment & Plan Note (Addendum)
Hemoglobin A1c has been slightly elevated, continue follow-up with endocrinologist.  Diabetic foot exam done normal abnormal findings.  EKG done shows Sinus Rhythm  -RSR(V1) -nondiagnostic.  -Right atrial enlargement.  BORDERLINE  HR 72

## 2023-08-05 NOTE — Assessment & Plan Note (Signed)
Shingles immunization given #1.

## 2023-08-19 ENCOUNTER — Inpatient Hospital Stay: Payer: No Typology Code available for payment source

## 2023-08-19 ENCOUNTER — Inpatient Hospital Stay: Payer: No Typology Code available for payment source | Admitting: Oncology

## 2023-08-21 DIAGNOSIS — Z2821 Immunization not carried out because of patient refusal: Secondary | ICD-10-CM | POA: Insufficient documentation

## 2023-08-21 NOTE — Assessment & Plan Note (Signed)
Was not given during this visit

## 2023-09-02 ENCOUNTER — Telehealth: Payer: Self-pay | Admitting: *Deleted

## 2023-09-02 NOTE — Telephone Encounter (Signed)
Spoke with [name] who called the office to make a follow up appointment with Dr. Alvester Morin. Appointment for Monday, November 18 th at 2:45 was given. Pt agreed to date and time and had no further concerns or questions at this time.

## 2023-09-08 ENCOUNTER — Telehealth: Payer: Self-pay | Admitting: *Deleted

## 2023-09-08 ENCOUNTER — Other Ambulatory Visit (HOSPITAL_COMMUNITY)
Admission: RE | Admit: 2023-09-08 | Discharge: 2023-09-08 | Disposition: A | Discharge status: Home / Self Care | Payer: No Typology Code available for payment source | Source: Ambulatory Visit | Attending: Psychiatry | Admitting: Psychiatry

## 2023-09-08 ENCOUNTER — Inpatient Hospital Stay: Payer: No Typology Code available for payment source | Attending: Psychiatry | Admitting: Psychiatry

## 2023-09-08 ENCOUNTER — Encounter: Payer: Self-pay | Admitting: Psychiatry

## 2023-09-08 VITALS — BP 135/78 | HR 72 | Temp 98.8°F | Resp 20 | Wt 181.0 lb

## 2023-09-08 DIAGNOSIS — N879 Dysplasia of cervix uteri, unspecified: Secondary | ICD-10-CM

## 2023-09-08 DIAGNOSIS — C20 Malignant neoplasm of rectum: Secondary | ICD-10-CM

## 2023-09-08 DIAGNOSIS — Z923 Personal history of irradiation: Secondary | ICD-10-CM

## 2023-09-08 DIAGNOSIS — N9089 Other specified noninflammatory disorders of vulva and perineum: Secondary | ICD-10-CM | POA: Diagnosis not present

## 2023-09-08 DIAGNOSIS — N893 Dysplasia of vagina, unspecified: Secondary | ICD-10-CM | POA: Insufficient documentation

## 2023-09-08 DIAGNOSIS — Z9889 Other specified postprocedural states: Secondary | ICD-10-CM

## 2023-09-08 DIAGNOSIS — Z86002 Personal history of in-situ neoplasm of other and unspecified genital organs: Secondary | ICD-10-CM

## 2023-09-08 NOTE — Patient Instructions (Signed)
It was a pleasure to see you in clinic today. - Pap smear collected today -We reviewed that given the agglutination at the vaginal apex, I do have some concerns whether or not we are able to safely monitor your cervix with Pap smears at this time.  Given this, I think that we may need to plan for hysterectomy. -In the meantime, resume vaginal estrogen twice weekly. -We will get you scheduled for an MRI of the pelvis for preoperative planning. - Return visit planned for after MRI is complete for preoperative visit.  Thank you very much for allowing me to provide care for you today.  I appreciate your confidence in choosing our Gynecologic Oncology team at [facility].  If you have any questions about your visit today please call our office or send [place] a MyChart message and we will get back to you as soon as possible.

## 2023-09-08 NOTE — Telephone Encounter (Signed)
Spoke with patient and advised that [facility] was called and they do not have separate vaginal applicators. Pt advised that she could try [facility] for the applicator or purchase monistat at the pharmacy and that will contain an applicator she could use for estrogen cream. Pt verbalized understanding and said she would try [place] first.

## 2023-09-08 NOTE — Progress Notes (Signed)
Gynecologic Oncology Return Clinic Visit  Date of Service: 09/08/2023 Referring Provider: Gertie Exon, MD  Assessment & Plan: [name] is a 57 y.o. 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  CIN3/VAIN3: -Exam today with overall healthy appearing vaginal mucosa, however vaginal apex appears to have completely agglutinated over the cervix at this time. -Pap smear collected. -However, given apparent agglutination that occludes visualization of the cervix, reviewed my concern of ability to continue safe surveillance of her cervix and vaginal apex with her history of cervical and vaginal dysplasia. -Given this, we may likely need to proceed with hysterectomy. -Reviewed that hysterectomy may have some increased risk for her given her history of colorectal surgery and radiation exposure, in particular wound healing and risk of fistula formation.  Given this, recommend that she resume her vaginal estrogen in preparation for possible hysterectomy. -We are going to see if her pharmacy has a vaginal applicator.  Will follow-up with patient regarding this. -Otherwise, also discussed getting a pelvic MRI for preoperative planning given her history of colorectal surgery and radiation to evaluate for adhesive disease in preparation for hysterectomy. -Will follow-up Pap smear results. -Plan for patient to return after MRI complete for preoperative discussion.  RTC after pap, MRI  Clide Cliff, MD Gynecologic Oncology   Medical Decision Making I personally spent  TOTAL 30 minutes face-to-face and non-face-to-face in the care of this patient, which includes all pre, intra, and post visit time on the date of service.   ----------------------- Reason for Visit: Follow-up  Treatment History: - 05/07/2022 - Pap: ASCUS-H, HPV high risk detected - 05/28/2022 - Colposcopy: 10:00 biopsy HSIL, ECC HSIL - 07/02/2022 - LEEP and laser  of cervical and vaginal dysplasia: Decreased Lugol's uptake of the cervix in the upper vagina.  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  Interval History: Patient reports overall doing well.  She has not been using the vaginal estrogen ongoing as she reports throwing away the applicator.  Thinks she stopped about 1 month after her last visit with me.  Otherwise denies vaginal bleeding, abdominal or pelvic pain, or vaginal discharge.  She has not recently been sexually active.    Past Medical/Surgical History: Past Medical History:  Diagnosis Date   Anticoagulant long-term use    eliquis--  managed by dr Truett Perna and pcp   Cancer of colon with rectum [facility]) 12/2010   oncologist--- dr Truett Perna;  dx whille pt in hospital had work-up with positive PE started coumadin GI bleed colonscopy positive polyp;  IVC placed prior to low anterior resection of rectum 01-31-2011; Stage III,  completed chemoradiation 09/ 2012   History of cancer chemotherapy    completed 09/ 2012   History of cervical dysplasia    History of COVID-19 03/10/2020   positive covid then admitted 03-21-2020 with increased sob , dx pneumonia MRI 03-22-2020  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 with tear with removal s/p stenting   History of radiation therapy    03-19-2011 to 04-30-2011 for rectal cancer   History of vaginal dysplasia    Hyperlipidemia, mixed    Hypothyroidism    folllowed by dr balan   IDA (iron deficiency anemia)    Migraines    Type 2 diabetes mellitus (HCC)    followed by dr Talmage Nap   (12-13-2022  per pt checks blood sugar bid ,  fasting average 122-133)   Urgency of urination    VIN III (vulvar intraepithelial neoplasia III)    Wears glasses     Past Surgical History:  Procedure Laterality Date   AORTA - BILATERAL FEMORAL ARTERY BYPASS GRAFT N/A 03/29/2020   Procedure: VENO VENOUS BYPASS;  Surgeon: Maeola Harman, MD;  Location[facility];  Service: Vascular;  Laterality: N/A;   APPLICATION OF ANGIOVAC N/A 03/29/2020   Procedure: [facility] MECHANICAL THROMBECTOMY;  Surgeon: Maeola Harman, MD;  Location[facility] OR;  Service: Vascular;  Laterality: N/A;   CESAREAN SECTION  07/25/2004   @[facility]    CO2 LASER APPLICATION N/A 07/02/2022   Procedure: CO2 LASER APPLICATION of distal cervix and upper vagina;  Surgeon: Romualdo Bolk, MD;  Location: [facility];  Service: Gynecology;  Laterality: N/A;   ENDOVASCULAR STENT INSERTION N/A 03/29/2020   Procedure: Inferior Vena Cava WallStent Graft Insertion;  Surgeon: Maeola Harman, MD;  Location[facility] OR;  Service: Vascular;  Laterality: N/A;   INSERTION OF VENA CAVA FILTER Right 02/22/2011   by dr Imogene Burn   LEEP N/A 07/02/2022   Procedure: LOOP ELECTROSURGICAL EXCISION PROCEDURE (LEEP) and colposcopy;  Surgeon: Romualdo Bolk, MD;  Location: [facility];  Service: Gynecology;  Laterality: N/A;   LOW ANTERIOR BOWEL RESECTION  01/31/2011   @[facility]  by dr Purnell Shoemaker;  of rectum   OPERATIVE ULTRASOUND N/A 12/17/2022   Procedure: OPERATIVE ULTRASOUND;  Surgeon: Clide Cliff, MD;  Location: [facility];  Service: Gynecology;  Laterality: N/A;  Dr.Charnetta Wulff ran [place]   PORT-A-CATH REMOVAL Right 12/22/2012   Procedure: REMOVAL PORT-A-CATH;  Surgeon: Adolph Pollack, MD;  Location: [facility];  Service: General;  Laterality: Right;   PORTACATH PLACEMENT  02/21/2011   dr Purnell Shoemaker   THROMBECTOMY ILIAC ARTERY N/A 03/29/2020   Procedure: THROMBECTOMY ILIAC ARTERY MECHANICAL THROMBECTOMY;  Surgeon: Maeola Harman, MD;  Location: [facility] OR;  Service: Vascular;  Laterality: N/A;    Family History  Problem Relation Age of Onset   Lung cancer Mother    Diabetes Mother    Hypertension Mother    Heart disease Father    Diabetes Brother    Kidney disease Brother    Cervical cancer Paternal Grandmother    Breast cancer Neg Hx    Colon cancer Neg Hx    Ovarian cancer Neg Hx    Endometrial cancer Neg Hx    Pancreatic cancer Neg Hx    Prostate cancer Neg Hx     Social History   Socioeconomic History   Marital status: Married    Spouse name: Not on file   Number of children: 1   Years of education: Not on file   Highest education level: Not on file  Occupational  History   Occupation: Engineering geologist  Tobacco Use   Smoking status: Never   Smokeless tobacco: Never  Vaping Use   Vaping status: Never Used  Substance and Sexual Activity   Alcohol use: Not Currently    Comment: rarely   Drug use: Never   Sexual activity: Not on file  Other Topics Concern   Not on file  Social History Narrative   Not on file   Social Determinants of Health   Financial Resource Strain: Not on file  Food Insecurity: No Food Insecurity (11/21/2022)   Hunger Vital Sign    Worried About Running Out of Food in the Last Year: Never true    Ran Out of Food in the Last Year: Never true  Transportation Needs: No Transportation Needs (11/21/2022)   PRAPARE - Administrator, Civil Service (Medical): No    Lack of Transportation (Non-Medical): No  Physical  Activity: Not on file  Stress: Not on file  Social Connections: Moderately Integrated (11/21/2022)   Social Connection and Isolation Panel [NHANES]    Frequency of Communication with Friends and Family: More than three times a week    Frequency of Social Gatherings with Friends and Family: Not on file    Attends Religious Services: More than 4 times per year    Active Member of [employer] or Organizations: No    Attends Engineer, structural: Not on file    Marital Status: Married    Current Medications:  Current Outpatient Medications:    Accu-Chek Comptroller, twice a day for 30 days, Disp: , Rfl:    apixaban (ELIQUIS) 5 MG TABS tablet, TAKE 1 TABLET(5 MG) BY MOUTH TWICE DAILY (Patient taking differently: 2.5 mg. TAKE 1 TABLET(5 MG) BY MOUTH TWICE DAILY), Disp: , Rfl:    Aspirin-Acetaminophen-Caffeine (EXCEDRIN MIGRAINE PO), Take by mouth as needed., Disp: , Rfl:    Blood Glucose Monitoring Suppl (ACCU-CHEK GUIDE) w/Device KIT, as directed twice a day for 30 days, Disp: , Rfl:    cholecalciferol (VITAMIN D) 400 UNITS TABS tablet, Take 400 Units by mouth daily. , Disp: , Rfl:    cyanocobalamin 500 MCG tablet, Take 500 mcg by mouth daily. Vitamin b 12, Disp: , Rfl:    estradiol (ESTRACE) 0.1 MG/GM vaginal cream, S: insert 1 gram intravaginally 3 x a week at hs., Disp: 42.5 g, Rfl: 0   Ferrous Sulfate (IRON) 325 (65 Fe) MG TABS, Take 1 tablet by mouth daily., Disp: , Rfl:    Glucagon (GVOKE HYPOPEN 2-PACK) 0.5 MG/0.1ML SOAJ, Inject 0.5 mg into the skin as needed., Disp: 0.2 mL, Rfl: 3   glucose blood (ACCU-CHEK GUIDE) test strip, as directed In Vitro twice a day for 30 days, Disp: , Rfl:    Insulin Lispro (HUMALOG [place]), Inject 10 Units into the skin 3 (three) times daily with meals. , Disp: , Rfl:    JARDIANCE 25 MG TABS tablet, Take 25 mg by mouth daily., Disp: , Rfl:    levothyroxine (SYNTHROID, LEVOTHROID) 100 MCG tablet, Take 100 mcg by mouth daily before breakfast., Disp: ,  Rfl:    Magnesium 500 MG TABS, Take 1 tablet by mouth at bedtime., Disp: , Rfl:    MOUNJARO 5 MG/0.5ML Pen, Inject 5 mg into the skin once a week. Wednesday's, Disp: , Rfl:    Multiple Vitamin (MULTIVITAMIN) tablet, Take 1 tablet by mouth daily., Disp: , Rfl:    rosuvastatin (CRESTOR) 10 MG tablet, Take 10 mg by mouth  daily., Disp: , Rfl:    Zinc 100 MG TABS, Take 1 tablet by mouth daily., Disp: , Rfl:   Review of Symptoms: Complete 10-system review is positive for: none  Physical Exam: BP 135/78 (BP Location: Right Arm, Patient Position: Sitting)   Pulse 72   Temp 98.8 F (37.1 C) (Oral)   Resp 20   Wt 181 lb (82.1 kg)   LMP 09/12/2011   SpO2 100%   BMI 25.24 kg/m  General: Alert, oriented, no acute distress. HEENT: Normocephalic, atraumatic. Neck symmetric without masses. Sclera anicteric.  Chest: Normal work of breathing. Clear to auscultation bilaterally.   Cardiovascular: Regular rate and rhythm, no murmurs. Abdomen: Soft, nontender.  Normoactive bowel sounds.  No masses.   Extremities: Grossly normal range of motion.  Warm, well perfused.  No edema bilaterally. Skin: No rashes or lesions noted. Lymphatics: No inguinal adenopathy. GU: Normal appearing external genitalia without erythema, excoriation, or lesions.  Speculum exam reveals normal vaginal mucosa distally.  Agglutination at vaginal apex with no cervix visualized.  Appears that apex of vagina has agglutinated over the cervix at this time.  Bimanual exam reveals smooth vaginal mucosa.  Firmness of cervix likely palpated proximal to agglutination.  No nodularity or pelvic mass..  Pap smear collected. exam chaperoned by Kimberly [place], CMA   Laboratory & Radiologic Studies: None

## 2023-09-12 ENCOUNTER — Inpatient Hospital Stay (HOSPITAL_BASED_OUTPATIENT_CLINIC_OR_DEPARTMENT_OTHER): Payer: No Typology Code available for payment source | Admitting: Oncology

## 2023-09-12 ENCOUNTER — Inpatient Hospital Stay: Payer: No Typology Code available for payment source

## 2023-09-12 VITALS — BP 124/88 | HR 68 | Temp 98.1°F | Resp 18 | Ht 71.0 in | Wt 178.1 lb

## 2023-09-12 DIAGNOSIS — C20 Malignant neoplasm of rectum: Secondary | ICD-10-CM

## 2023-09-12 DIAGNOSIS — I82220 Acute embolism and thrombosis of inferior vena cava: Secondary | ICD-10-CM

## 2023-09-12 DIAGNOSIS — I2699 Other pulmonary embolism without acute cor pulmonale: Secondary | ICD-10-CM

## 2023-09-12 DIAGNOSIS — Z86002 Personal history of in-situ neoplasm of other and unspecified genital organs: Secondary | ICD-10-CM | POA: Diagnosis not present

## 2023-09-12 LAB — CBC WITH DIFFERENTIAL (CANCER CENTER ONLY)
Abs Immature Granulocytes: 0 10*3/uL (ref 0.00–0.07)
Basophils Absolute: 0 10*3/uL (ref 0.0–0.1)
Basophils Relative: 1 %
Eosinophils Absolute: 0 10*3/uL (ref 0.0–0.5)
Eosinophils Relative: 1 %
HCT: 45.3 % (ref 36.0–46.0)
Hemoglobin: 15.2 g/dL — ABNORMAL HIGH (ref 12.0–15.0)
Immature Granulocytes: 0 %
Lymphocytes Relative: 37 %
Lymphs Abs: 1 10*3/uL (ref 0.7–4.0)
MCH: 30.4 pg (ref 26.0–34.0)
MCHC: 33.6 g/dL (ref 30.0–36.0)
MCV: 90.6 fL (ref 80.0–100.0)
Monocytes Absolute: 0.2 10*3/uL (ref 0.1–1.0)
Monocytes Relative: 8 %
Neutro Abs: 1.5 10*3/uL — ABNORMAL LOW (ref 1.7–7.7)
Neutrophils Relative %: 53 %
Platelet Count: 183 10*3/uL (ref 150–400)
RBC: 5 MIL/uL (ref 3.87–5.11)
RDW: 12.5 % (ref 11.5–15.5)
WBC Count: 2.8 10*3/uL — ABNORMAL LOW (ref 4.0–10.5)
nRBC: 0 % (ref 0.0–0.2)

## 2023-09-12 NOTE — Progress Notes (Signed)
[facility] OFFICE PROGRESS NOTE   Diagnosis: Rectal cancer, history of venous thrombosis  INTERVAL HISTORY:   [name] returns as scheduled.  She feels well.  Dr. Isaiah [name] in September to discuss the indication for continue anticoagulation.  The apixaban was dose reduced.  She will follow-up with Dr. Isaiah [name] in 6 months to decide on discontinuing anticoagulation.  No bleeding or symptom of thrombosis. She is followed by Dr. Alvester [name] for cervical dysplasia.  Dr. Alvester [name] is considering a hysterectomy since there is poor visualization of the cervix.  Objective:  Vital signs in last 24 hours:  Blood pressure 124/88, pulse 68, temperature 98.1 F (36.7 C), temperature source Temporal, resp. rate 18, height 5\' 11"  (1.803 m), weight 178 lb 1.6 oz (80.8 kg), last menstrual period 09/12/2011, SpO2 98%.    Lymphatics: No cervical, supraclavicular, axillary, or inguinal nodes Resp: Lungs clear bilaterally Cardio: Regular rate and rhythm GI: No hepatosplenomegaly, nontender, no mass Vascular: No leg edema   Lab Results:  Lab Results  Component Value Date   WBC 2.8 (L) 09/12/2023   HGB 15.2 (H) 09/12/2023   HCT 45.3 09/12/2023   MCV 90.6 09/12/2023   PLT 183 09/12/2023   NEUTROABS 1.5 (L) 09/12/2023    CMP  Lab Results  Component Value Date   NA 146 (H) 07/21/2023   K 4.9 07/21/2023   CL 107 (H) 07/21/2023   CO2 24 07/21/2023   GLUCOSE 109 (H) 07/21/2023   BUN 16 07/21/2023   CREATININE 0.97 07/21/2023   CALCIUM 10.0 07/21/2023   PROT 7.3 07/21/2023   ALBUMIN 4.6 07/21/2023   AST 18 07/21/2023   ALT 27 07/21/2023   ALKPHOS 115 07/21/2023   BILITOT 0.5 07/21/2023   GFRNONAA >60 12/27/2020   GFRAA 81 12/05/2020    Lab Results  Component Value Date   CEA1 <1.00 03/04/2019   CEA <0.5 02/12/2016     Medications: I have reviewed the patient's current medications.   Assessment/Plan: Rectal cancer, stage III (T3 N1b), status post a low anterior resection  01/31/2011, status post radiation with continuous infusion 5-FU 03/19/2011 through 04/30/2011. Status post FOLFOX beginning 07/03/2011 for 8 cycles. Surveillance colonoscopy 02/19/2012-negative for recurrent cancer or polyps. Surveillance colonoscopy 04/15/2014-entire examined colon normal; terminal ileum appeared normal. Next colonoscopy recommended at a 5 year interval. Surveillance CT scans negative 12/17/2013 Pulmonary embolism March 2012, status post IVC filter placement prior to cancer surgery in April 2012, followed by Dr. Marchelle [name]. Risk factors for pulmonary embolism felt to be related to obesity, oral contraceptive use, rectal cancer, and sedentary job.  Coumadin discontinued 2014, aspirin initiated Removal of Port-A-Cath March 2014 Urinary/fecal incontinence. Initially improved with the pelvic physical therapy program. Diagnosed with Covid 03/10/2020 Hospitalized 03/21/2020 with fatigue/malaise, low back pain.  She was found to have multifocal patchy airspace opacities throughout both lungs consistent with multifocal pneumonia on CT 03/21/2020. MRI of the thoracic/lumbar spine 03/22/2020 showed T7 disc herniation but also showed thrombosis of the IVC and both iliac veins to the level of the IVC filter.  Subsequently Doppler study showed extensive DVT in bilateral lower extremity.  She was started on IV heparin.  Anticoagulation changed to argatroban due to thrombocytopenia, HIT antibody negative 03/30/2020.  The platelet count improved.  Vascular surgery was consulted.  She underwent catheter guided lytic therapy 03/28/2020, mechanical thrombectomy, IVC filter removal which was complicated by IVC tear requiring IVC stenting 03/29/2020.  Postoperatively she developed acute blood loss anemia with hypovolemic shock, required pressors.  She  stabilized and was transferred to the medical unit then developed vomiting and was ultimately found to have an ileus/partial small bowel obstruction.  She was transitioned to  Eliquis which she continues. 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 Leukopenia/mild neutropenia-chronic CIN-3, VAIN3    Disposition: [name] is in remission from rectal cancer.  She will continue colonoscopy surveillance with Dr. Loreta [name].  She has mild neutropenia.  This is likely a benign normal variant. She is now maintained on reduced intensity anticoagulation after seeing Dr. Isaiah [name].  She plans to follow-up with Dr. Isaiah [name] in approximately 4 months to discuss the indication for discontinuing anticoagulation therapy.  She will return for an office visit here in 9 months.  She will continue follow-up with gynecologic oncology for the cervical and vaginal dysplasia.    [name] Papas, MD  09/12/2023  11:23 AM

## 2023-09-15 ENCOUNTER — Ambulatory Visit (HOSPITAL_COMMUNITY)
Admission: RE | Admit: 2023-09-15 | Discharge: 2023-09-15 | Disposition: A | Discharge status: Home / Self Care | Payer: No Typology Code available for payment source | Source: Ambulatory Visit | Attending: Psychiatry | Admitting: Psychiatry

## 2023-09-15 DIAGNOSIS — C20 Malignant neoplasm of rectum: Secondary | ICD-10-CM | POA: Insufficient documentation

## 2023-09-15 DIAGNOSIS — N879 Dysplasia of cervix uteri, unspecified: Secondary | ICD-10-CM | POA: Diagnosis present

## 2023-09-15 DIAGNOSIS — N893 Dysplasia of vagina, unspecified: Secondary | ICD-10-CM | POA: Diagnosis present

## 2023-09-15 DIAGNOSIS — Z923 Personal history of irradiation: Secondary | ICD-10-CM | POA: Diagnosis present

## 2023-09-15 MED ORDER — GADOBUTROL 1 MMOL/ML IV SOLN
7.0000 mL | Freq: Once | INTRAVENOUS | Status: AC | PRN
  Administered 2023-09-15: 7 mL via INTRAVENOUS

## 2023-09-22 ENCOUNTER — Inpatient Hospital Stay: Payer: No Typology Code available for payment source | Attending: Psychiatry | Admitting: Psychiatry

## 2023-09-22 VITALS — BP 130/82 | HR 67 | Temp 98.8°F | Resp 20 | Wt 181.8 lb

## 2023-09-22 DIAGNOSIS — Z85048 Personal history of other malignant neoplasm of rectum, rectosigmoid junction, and anus: Secondary | ICD-10-CM | POA: Diagnosis not present

## 2023-09-22 DIAGNOSIS — Z923 Personal history of irradiation: Secondary | ICD-10-CM | POA: Insufficient documentation

## 2023-09-22 DIAGNOSIS — N828 Other female genital tract fistulae: Secondary | ICD-10-CM | POA: Insufficient documentation

## 2023-09-22 DIAGNOSIS — Z86002 Personal history of in-situ neoplasm of other and unspecified genital organs: Secondary | ICD-10-CM | POA: Diagnosis present

## 2023-09-22 DIAGNOSIS — N893 Dysplasia of vagina, unspecified: Secondary | ICD-10-CM

## 2023-09-22 MED ORDER — FLUCONAZOLE 150 MG PO TABS: 150.0000 mg | ORAL_TABLET | Freq: Once | ORAL | 0 refills | Status: AC

## 2023-09-22 NOTE — Patient Instructions (Signed)
It was a pleasure to see you in clinic today. - We talked about considering a hysterectomy given that it will likely be challenging to continue to safely monitor your cervix for precancer/cancer - We will get a rectal contrast study to try to rule out a fistula between the rectum and the vagina. - Return visit planned for March with tentative surgery for April 15.  Thank you very much for allowing me to provide care for you today.  I appreciate your confidence in choosing our Gynecologic Oncology team at [facility].  If you have any questions about your visit today please call our office or send [place] a MyChart message and we will get back to you as soon as possible.

## 2023-09-22 NOTE — Progress Notes (Unsigned)
Gynecologic Oncology Return Clinic Visit  Date of Service: 09/22/2023 Referring Provider: Gertie Exon, MD [facility]  Assessment & Plan: [name] is a 57 y.o. woman with a history of CIN3 and VAIN3, c/b poor heeling following LEEP and vaginal laser 06/2022, now with agglutination of vaginal apex, non-visualization of the cervix, who presents for follow-up.  CIN3/VAIN3: -We reviewed Pap smear results which are reassuring at this time.  At time of visit the report that was provided to [place] from pathology said ASCUS, HPV negative.  Subsequent to the visit they revised the report to NILM, HPV negative.  In either situation, nothing to do acutely with these results. - However, Pap did confirm no transformation zone component identified.  This is consistent with her exam with the vaginal apex agglutinated and the cervix not visualized. - Reviewed that at this time although her Pap smear is currently reassuring, I do not feel confident that we can adequately survey her cervix for any recurrent dysplasia.  In this case, neck step would be to consider hysterectomy. -Given her history of colorectal cancer with surgery and radiation, we reviewed that she is an increased risk of surgical complication including vaginal cuff dehiscence and fistula formation.  During this discussion, patient noted that she experiences occasional flatus from the vagina and liquid stool from the vagina about once a week.  She does have incontinence of stool so it is unclear at this time if this is due to a fistula or overflow from her rectum.  Recommend barium enema study to further evaluate for possible presence of rectovaginal fistula. -Reviewed MRI with radiologist. MRI results reviewed with patient.  Overall no abnormality.  Her cecum does have a large stool burden and is sitting in the anterior cul-de-sac.  Additionally, on my review of the images there does appear to be some tethering at the level of the  posterior vaginal fornix.  Unclear if this could be a possible site for fistula if one is present. -Patient is in agreement with hysterectomy.  Understanding that first we need to rule out fistula to better identify surgical plan.  Given most recent biopsies and Pap smear were low-grade and normal, do not feel that we have to urgently perform a hysterectomy, but reasonable to consider in the next 6 to 12 months. -Continue vaginal estrogen for now in preparation for future hysterectomy. -Rx for Diflucan sent given Pap smear with Candida. -Patient desires to consider surgery in April.  We will tentatively get her on the schedule for 02/03/2024.  Return to 3 weeks prior for preoperative visit. -Follow-up results of barium enema study.  RTC prior to OR.  Clide Cliff, MD Gynecologic Oncology   Medical Decision Making I personally spent  TOTAL 45 minutes face-to-face and non-face-to-face in the care of this patient, which includes all pre, intra, and post visit time on the date of service.   ----------------------- Reason for Visit: Follow-up  Treatment History: - 05/07/2022 - Pap: ASCUS-H, HPV high risk detected - 05/28/2022 - Colposcopy: 10:00 biopsy HSIL, ECC HSIL - 07/02/2022 - LEEP and laser of cervical and vaginal dysplasia: Decreased Lugol's uptake of the cervix in the upper vagina.  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 - 09/08/23 - PAP NILM, HPV negative (  transformation zone component absent)  Interval History: No major changes since her last visit.  Denies any vaginal itching.  She is using the vaginal estrogen.  Not currently sexually active with her partner but hopes to be in the future.   Past  Medical/Surgical History: Past Medical History:  Diagnosis Date   Anticoagulant long-term use    eliquis--  managed by dr Truett Perna and pcp   Cancer of colon with rectum [facility]) 12/2010   oncologist--- dr Truett Perna;  dx whille pt in hospital had work-up with positive PE started coumadin GI bleed colonscopy positive polyp;  IVC placed prior to low anterior resection of rectum 01-31-2011; Stage III,  completed chemoradiation 09/ 2012   History of cancer chemotherapy    completed 09/ 2012   History of cervical dysplasia    History of COVID-19 03/10/2020   positive covid then admitted 03-21-2020 with increased sob , dx pneumonia MRI 03-22-2020  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 with tear with removal s/p stenting   History of radiation therapy    03-19-2011 to 04-30-2011 for rectal cancer   History of vaginal dysplasia    Hyperlipidemia, mixed    Hypothyroidism    folllowed by dr Talmage Nap   IDA (iron deficiency anemia)    Migraines    Type 2 diabetes mellitus (HCC)    followed by dr Talmage Nap   (12-13-2022  per pt checks blood sugar bid ,  fasting average 122-133)   Urgency of urination    VIN III (vulvar intraepithelial neoplasia III)    Wears glasses     Past Surgical History:  Procedure Laterality Date   AORTA - BILATERAL FEMORAL ARTERY BYPASS GRAFT N/A 03/29/2020   Procedure: VENO VENOUS BYPASS;  Surgeon: Maeola Harman, MD;  Location[facility] OR;  Service:  Vascular;  Laterality: N/A;   APPLICATION OF ANGIOVAC N/A 03/29/2020   Procedure: [facility] MECHANICAL THROMBECTOMY;  Surgeon: Maeola Harman, MD;  Location: [facility] OR;  Service: Vascular;  Laterality: N/A;   CESAREAN SECTION  07/25/2004   @[facility]    CO2 LASER APPLICATION N/A 07/02/2022   Procedure: CO2 LASER APPLICATION of distal cervix and upper vagina;  Surgeon: Romualdo Bolk, MD;  Location: [facility];  Service: Gynecology;  Laterality: N/A;   ENDOVASCULAR STENT INSERTION N/A 03/29/2020   Procedure: Inferior Vena Cava WallStent Graft Insertion;  Surgeon: Maeola Harman, MD;  Location: [facility] OR;  Service: Vascular;  Laterality: N/A;   INSERTION OF VENA CAVA FILTER Right 02/22/2011   by dr Imogene Burn   LEEP N/A 07/02/2022   Procedure: LOOP ELECTROSURGICAL EXCISION PROCEDURE (LEEP) and colposcopy;  Surgeon: Romualdo Bolk, MD;  Location: [facility];  Service: Gynecology;  Laterality: N/A;   LOW ANTERIOR BOWEL RESECTION  01/31/2011   @[facility]  by dr Purnell Shoemaker;  of rectum   OPERATIVE ULTRASOUND N/A 12/17/2022   Procedure: OPERATIVE ULTRASOUND;  Surgeon: Clide Cliff, MD;  Location: [facility] [place];  Service: Gynecology;  Laterality: N/A;  Dr.Naziah Portee ran [place]   PORT-A-CATH REMOVAL Right 12/22/2012   Procedure: REMOVAL PORT-A-CATH;  Surgeon: Adolph Pollack, MD;  Location: [facility];  Service: General;  Laterality: Right;   PORTACATH PLACEMENT  02/21/2011   dr Purnell Shoemaker   THROMBECTOMY ILIAC ARTERY N/A 03/29/2020   Procedure: THROMBECTOMY ILIAC ARTERY MECHANICAL THROMBECTOMY;  Surgeon: Maeola Harman, MD;  Location: [facility] OR;  Service: Vascular;  Laterality: N/A;    Family History  Problem Relation Age of Onset   Lung cancer Mother    Diabetes Mother    Hypertension Mother    Heart disease Father    Diabetes Brother    Kidney disease Brother    Cervical cancer Paternal Grandmother    Breast cancer Neg Hx     Colon cancer Neg Hx    Ovarian cancer Neg Hx    Endometrial cancer Neg Hx    Pancreatic cancer Neg Hx    Prostate cancer Neg Hx     Social History   Socioeconomic History   Marital status: Married    Spouse name: Not on file   Number of children: 1   Years of education: Not on file   Highest education level: Not on file  Occupational History   Occupation: Engineering geologist  Tobacco Use   Smoking status: Never   Smokeless tobacco: Never  Vaping Use   Vaping status: Never Used  Substance and Sexual Activity   Alcohol use: Not Currently    Comment: rarely   Drug use: Never   Sexual activity: Not on file  Other Topics Concern   Not on file  Social History Narrative   Not on file   Social Determinants of Health   Financial Resource Strain: Not on file  Food Insecurity: No Food Insecurity (11/21/2022)   Hunger Vital Sign    Worried About Running Out of Food in the Last Year: Never true    Ran Out of Food in the Last Year: Never true  Transportation Needs: No Transportation Needs (11/21/2022)   PRAPARE - Administrator, Civil Service (Medical): No    Lack of Transportation (Non-Medical): No  Physical Activity: Not on file  Stress: Not on file  Social Connections: Moderately Integrated (11/21/2022)   Social Connection and Isolation Panel [NHANES]    Frequency of Communication with Friends and Family: More than three times a week    Frequency of Social Gatherings with Friends and Family: Not on file    Attends Religious Services: More than 4 times per year    Active Member of [employer] or Organizations: No    Attends Engineer, structural: Not on file    Marital Status: Married    Current Medications:  Current Outpatient Medications:    Accu-Chek Comptroller, twice a day for 30 days, Disp: , Rfl:    Aspirin-Acetaminophen-Caffeine (EXCEDRIN MIGRAINE PO), Take by mouth as needed., Disp: , Rfl:    Blood Glucose Monitoring Suppl (ACCU-CHEK GUIDE)  w/Device KIT, as directed twice a day for 30 days, Disp: , Rfl:    cholecalciferol (VITAMIN D) 400 UNITS TABS tablet, Take 400 Units by mouth daily. , Disp: , Rfl:    cyanocobalamin 500 MCG tablet, Take 500 mcg by mouth daily. Vitamin b 12, Disp: , Rfl:    ELIQUIS 2.5 MG TABS tablet, Take 2.5 mg by mouth 2 (two) times daily., Disp: , Rfl:    estradiol (ESTRACE) 0.1 MG/GM vaginal cream,  S: insert 1 gram intravaginally 3 x a week at hs., Disp: 42.5 g, Rfl: 0   Ferrous Sulfate (IRON) 325 (65 Fe) MG TABS, Take 1 tablet by mouth daily., Disp: , Rfl:    glucose blood (ACCU-CHEK GUIDE) test strip, as directed In Vitro twice a day for 30 days, Disp: , Rfl:    Insulin Lispro (HUMALOG [place]), Inject 10 Units into the skin 3 (three) times daily with meals. , Disp: , Rfl:    JARDIANCE 25 MG TABS tablet, Take 25 mg by mouth daily., Disp: , Rfl:    levothyroxine (SYNTHROID, LEVOTHROID) 100 MCG tablet, Take 100 mcg by mouth daily before breakfast., Disp: , Rfl:    Magnesium 500 MG TABS, Take 1 tablet by mouth at bedtime., Disp: , Rfl:    MOUNJARO 5 MG/0.5ML Pen, Inject 5 mg into the skin once a week. Wednesday's, Disp: , Rfl:    Multiple Vitamin (MULTIVITAMIN) tablet, Take 1 tablet by mouth daily., Disp: , Rfl:    rosuvastatin (CRESTOR) 10 MG tablet, Take 10 mg by mouth daily., Disp: , Rfl:    Zinc 100 MG TABS, Take 1 tablet by mouth daily., Disp: , Rfl:   Review of Symptoms: Complete 10-system review is positive for: none  Physical Exam: BP 130/82 (BP Location: Right Arm, Patient Position: Sitting)   Pulse 67   Temp 98.8 F (37.1 C) (Oral)   Resp 20   Wt 181 lb 12.8 oz (82.5 kg)   LMP 09/12/2011   SpO2 100%   BMI 25.36 kg/m  General: Alert, oriented, no acute distress. HEENT: Normocephalic, atraumatic. Neck symmetric without masses. Sclera anicteric.  Chest: Normal work of breathing.      Laboratory & Radiologic Studies:  Pap smear (09/08/23): NILM Transformation zone component absent. Fungal  organisms present consistent with candida spp. HPV HR negative  MR Pelvis W Wo Contrast 09/15/2023  Narrative CLINICAL DATA:  Cervical cancer. Staging. Cervical dysplasia. History of colorectal cancer. Prior radiation therapy.  EXAM: MRI PELVIS WITHOUT AND WITH CONTRAST  TECHNIQUE: Multiplanar multisequence MR imaging of the pelvis was performed both before and after administration of intravenous contrast.  CONTRAST:  7mL GADAVIST GADOBUTROL 1 MMOL/ML IV SOLN  COMPARISON:  04/06/2020 abdominopelvic CT.  FINDINGS: Urinary Tract:  No distal hydroureter.  Normal urinary bladder.  Bowel:  Normal pelvic bowel loops.  Vascular/Lymphatic: No pelvic aneurysm or sidewall adenopathy.  Reproductive: The uterus measures maximally 6.4 cm. Minimal precontrast T1 hyperintensity within the endometrium likely represents blood products on 43/11.  T2 hyperintensity within the myometrium, suspicious for adenomyosis.  No endometrial thickening.  The T2 hypointense cervical stroma is maintained, including on 13/3. No well-defined cervical mass. Mild ill definition of parametrial soft tissue planes included on 20/5 is likely radiation induced. Given this limitation, no parametrial extension.  Other:  No significant free fluid.  Musculoskeletal: No acute osseous abnormality.  IMPRESSION: 1. No well-defined cervical mass identified. No parametrial or vaginal extension of tumor. 2. No pelvic adenopathy.   Electronically Signed By: Jeronimo Greaves M.D. On: 09/22/2023 12:25

## 2023-09-23 ENCOUNTER — Telehealth: Payer: Self-pay | Admitting: Oncology

## 2023-09-23 ENCOUNTER — Encounter: Payer: Self-pay | Admitting: Psychiatry

## 2023-09-23 LAB — CYTOLOGY - PAP
Adequacy: ABSENT
Comment: NEGATIVE
Diagnosis: NEGATIVE
Diagnosis: REACTIVE
High risk HPV: NEGATIVE

## 2023-09-23 NOTE — Telephone Encounter (Signed)
Called [name] and advised her of the updated pap smear results from 09/08/23.  Let her know that Dr. Alvester Morin has reviewed the results and there is no change in her recommendations but the results are reassuring.  Kadynce verbalized understanding and agreement.

## 2023-09-26 ENCOUNTER — Telehealth: Payer: Self-pay | Admitting: *Deleted

## 2023-09-26 ENCOUNTER — Other Ambulatory Visit: Payer: Self-pay | Admitting: Gynecologic Oncology

## 2023-09-26 DIAGNOSIS — N828 Other female genital tract fistulae: Secondary | ICD-10-CM

## 2023-09-26 DIAGNOSIS — N829 Female genital tract fistula, unspecified: Secondary | ICD-10-CM

## 2023-09-26 DIAGNOSIS — N898 Other specified noninflammatory disorders of vagina: Secondary | ICD-10-CM

## 2023-09-26 NOTE — Telephone Encounter (Signed)
Per Dr Alvester Morin and Warner Mccreedy NP patient scheduled for a CT A/P with rectal contrast. Patient given app date/time and intstructions

## 2023-10-03 ENCOUNTER — Ambulatory Visit (HOSPITAL_COMMUNITY)
Admission: RE | Admit: 2023-10-03 | Discharge: 2023-10-03 | Disposition: A | Discharge status: Home / Self Care | Payer: No Typology Code available for payment source | Source: Ambulatory Visit | Attending: Gynecologic Oncology | Admitting: Gynecologic Oncology

## 2023-10-03 DIAGNOSIS — E119 Type 2 diabetes mellitus without complications: Secondary | ICD-10-CM

## 2023-10-03 DIAGNOSIS — N828 Other female genital tract fistulae: Secondary | ICD-10-CM | POA: Diagnosis present

## 2023-10-03 DIAGNOSIS — N898 Other specified noninflammatory disorders of vagina: Secondary | ICD-10-CM

## 2023-10-03 LAB — POCT I-STAT CREATININE: Creatinine, Ser: 0.9 mg/dL (ref 0.44–1.00)

## 2023-10-03 MED ORDER — IOHEXOL 300 MG/ML  SOLN
30.0000 mL | Freq: Once | INTRAMUSCULAR | Status: AC | PRN
  Administered 2023-10-03: 30 mL

## 2023-10-03 MED ORDER — IOHEXOL 300 MG/ML  SOLN
100.0000 mL | Freq: Once | INTRAMUSCULAR | Status: AC | PRN
  Administered 2023-10-03: 100 mL via INTRAVENOUS

## 2023-10-07 ENCOUNTER — Encounter: Payer: Self-pay | Admitting: Nurse Practitioner

## 2023-10-07 NOTE — Telephone Encounter (Signed)
 Care team updated and letter sent for eye exam notes.

## 2023-11-04 ENCOUNTER — Telehealth: Payer: Self-pay | Admitting: Psychiatry

## 2023-11-04 NOTE — Telephone Encounter (Signed)
 Attempted to call pt regarding scan results. Evidence of rectovaginal fistula. Generic VM left. Will try again at another time.

## 2023-11-10 ENCOUNTER — Telehealth: Payer: Self-pay | Admitting: Psychiatry

## 2023-11-10 NOTE — Telephone Encounter (Signed)
Called patient with the CT findings.  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  Have had images transferred to [facility] for continuing care.  Will reach out to colorectal surgery at [facility] to review images to determine potential surgical plan.  Will keep patient posted.  All questions answered.

## 2023-11-20 ENCOUNTER — Ambulatory Visit: Payer: No Typology Code available for payment source | Admitting: Nurse Practitioner

## 2023-12-08 ENCOUNTER — Telehealth: Payer: Self-pay

## 2023-12-08 NOTE — Telephone Encounter (Signed)
[name] called stating awhile back she received a text from [facility] stating she had been referred by Dr.Newton. She called [facility] and they were not sure what department to transfer her to or who to schedule her with.   Dr.Newton please advise. Pt aware office will call her back.

## 2023-12-09 ENCOUNTER — Ambulatory Visit: Payer: No Typology Code available for payment source | Admitting: Nurse Practitioner

## 2023-12-09 ENCOUNTER — Encounter: Payer: Self-pay | Admitting: Nurse Practitioner

## 2023-12-09 VITALS — BP 120/80 | HR 75 | Temp 98.5°F | Ht 71.0 in | Wt 181.0 lb

## 2023-12-09 DIAGNOSIS — Z2821 Immunization not carried out because of patient refusal: Secondary | ICD-10-CM

## 2023-12-09 DIAGNOSIS — E113293 Type 2 diabetes mellitus with mild nonproliferative diabetic retinopathy without macular edema, bilateral: Secondary | ICD-10-CM | POA: Diagnosis not present

## 2023-12-09 DIAGNOSIS — E782 Mixed hyperlipidemia: Secondary | ICD-10-CM | POA: Diagnosis not present

## 2023-12-09 DIAGNOSIS — E039 Hypothyroidism, unspecified: Secondary | ICD-10-CM | POA: Diagnosis not present

## 2023-12-09 DIAGNOSIS — Z01818 Encounter for other preprocedural examination: Secondary | ICD-10-CM

## 2023-12-09 DIAGNOSIS — Z79899 Other long term (current) drug therapy: Secondary | ICD-10-CM

## 2023-12-09 DIAGNOSIS — Z85038 Personal history of other malignant neoplasm of large intestine: Secondary | ICD-10-CM | POA: Diagnosis not present

## 2023-12-09 MED ORDER — MOUNJARO 7.5 MG/0.5ML ~~LOC~~ SOAJ: 7.5000 mg | SUBCUTANEOUS | Status: AC

## 2023-12-09 NOTE — Progress Notes (Signed)
 Madelaine Bhat, CMA,acting as a Neurosurgeon for Arnette Felts, FNP.,have documented all relevant documentation on the behalf of Arnette Felts, FNP,as directed by  Arnette Felts, FNP while in the presence of Arnette Felts, FNP.  Subjective:  Patient ID: [name] , female    DOB: 1966/06/11 , 58 y.o.   MRN: [id number]  Chief Complaint  Patient presents with   Diabetes    HPI  Patient presents today for a chol and dm follow up, Patient reports compliance with medication. Patient denies any chest pain, SOB, or headaches. Patient has no concerns today. She may have to get a hysterectomy due to the radiation and a reconstruction of the colorectal area.   Diabetes She presents for her follow-up diabetic visit. She has type 2 diabetes mellitus. Pertinent negatives for diabetes include no polydipsia, no polyphagia and no polyuria. There are no hypoglycemic complications. There are no diabetic complications. Current diabetic treatment includes oral agent (monotherapy). (Blood sugar average 123 she is wearing a libre. )     Past Medical History:  Diagnosis Date   Anticoagulant long-term use    eliquis--  managed by dr Truett Perna and pcp   Cancer of colon with rectum (HCC) 12/2010   oncologist--- dr Truett Perna;  dx whille pt in hospital had work-up with positive PE started coumadin GI bleed colonscopy positive polyp;  IVC placed prior to low anterior resection of rectum 01-31-2011; Stage III,  completed chemoradiation 09/ 2012   History of cancer chemotherapy    completed 09/ 2012   History of cervical dysplasia    History of COVID-19 03/10/2020   positive covid then admitted 03-21-2020 with increased sob , dx pneumonia MRI 03-22-2020  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 Shari Prows ;   resolved  per cardiology lov 04-03-2022;   normal ETT and nuclear stress test 10/ 2007   History of pulmonary embolus (PE) 12/22/2010   in setting taking oral contraceptive;   s/p IVC filter placed 01-24-2011 prior to colon surgery;   03-23-2023  IVC thrombosis in setting covid complicated thrombectomy with tear with removal s/p stenting   History of radiation therapy    03-19-2011 to 04-30-2011 for rectal cancer   History of vaginal dysplasia    Hyperlipidemia, mixed    Hypothyroidism    folllowed by dr Talmage Nap   IDA (iron deficiency anemia)    Migraines    Type 2 diabetes mellitus (HCC)    followed by dr Talmage Nap   (12-13-2022  per pt checks blood sugar bid ,  fasting average 122-133)   Urgency of urination    VIN III (vulvar intraepithelial neoplasia III)    Wears glasses      Family History  Problem Relation Age of Onset   Lung cancer Mother    Diabetes Mother    Hypertension Mother    Heart disease Father    Diabetes Brother    Kidney disease Brother    Cervical cancer Paternal Grandmother    Breast cancer Neg Hx    Colon cancer Neg Hx    Ovarian cancer Neg Hx    Endometrial cancer Neg Hx    Pancreatic cancer Neg Hx    Prostate cancer Neg Hx      Current Outpatient Medications:    Accu-Chek  FastClix Lancets MISC, twice a day for 30 days, Disp: , Rfl:    Aspirin-Acetaminophen-Caffeine (EXCEDRIN MIGRAINE PO), Take by mouth as needed., Disp: , Rfl:    Blood Glucose Monitoring Suppl (ACCU-CHEK GUIDE) w/Device KIT, as directed twice a day for 30 days, Disp: , Rfl:    cholecalciferol (VITAMIN D) 400 UNITS TABS tablet, Take 400 Units by mouth daily. , Disp: , Rfl:    cyanocobalamin 500 MCG tablet, Take 500 mcg by mouth daily. Vitamin b 12, Disp: , Rfl:    ELIQUIS 2.5 MG TABS tablet, Take 2.5 mg by mouth 2 (two) times daily., Disp: , Rfl:    estradiol (ESTRACE) 0.1 MG/GM vaginal cream, S: insert 1 gram intravaginally 3 x a week at hs., Disp: 42.5 g, Rfl: 0   Ferrous Sulfate (IRON) 325 (65 Fe)  MG TABS, Take 1 tablet by mouth daily., Disp: , Rfl:    glucose blood (ACCU-CHEK GUIDE) test strip, as directed In Vitro twice a day for 30 days, Disp: , Rfl:    Insulin Lispro (HUMALOG [place]), Inject 10 Units into the skin 3 (three) times daily with meals. , Disp: , Rfl:    JARDIANCE 25 MG TABS tablet, Take 25 mg by mouth daily., Disp: , Rfl:    levothyroxine (SYNTHROID, LEVOTHROID) 100 MCG tablet, Take 100 mcg by mouth daily before breakfast., Disp: , Rfl:    Magnesium 500 MG TABS, Take 1 tablet by mouth at bedtime., Disp: , Rfl:    Multiple Vitamin (MULTIVITAMIN) tablet, Take 1 tablet by mouth daily., Disp: , Rfl:    rosuvastatin (CRESTOR) 10 MG tablet, Take 10 mg by mouth daily., Disp: , Rfl:    tirzepatide (MOUNJARO) 7.5 MG/0.5ML Pen, Inject 7.5 mg into the skin once a week., Disp: , Rfl:    Zinc 100 MG TABS, Take 1 tablet by mouth daily., Disp: , Rfl:    Allergies  Allergen Reactions   Penicillins Hives   Metformin And Related Other (See Comments)    Chest pain   Levofloxacin Other (See Comments)    Parathesia     Review of Systems  Constitutional: Negative.   HENT:  Negative for congestion.   Eyes: Negative.   Respiratory:  Negative for cough.        Chest congestion and runny nose   Cardiovascular: Negative.   Endocrine: Negative for polydipsia, polyphagia and polyuria.  Musculoskeletal: Negative.   Skin: Negative.   Neurological: Negative.   Psychiatric/Behavioral: Negative.       Today's Vitals   12/09/23 1602  BP: 120/80  Pulse: 75  Temp: 98.5 F (36.9 C)  TempSrc: Oral  Weight: 181 lb (82.1 kg)  Height: 5\' 11"  (1.803 m)  PainSc: 0-No pain   Body mass index is 25.24 kg/m.  Wt Readings from Last 3 Encounters:  12/09/23 181 lb (82.1 kg)  09/22/23 181 lb 12.8 oz (82.5 kg)  09/12/23 178 lb 1.6 oz (80.8 kg)     Objective:  Physical Exam Vitals reviewed.  Constitutional:      General: She is not in acute distress.    Appearance: Normal appearance. She is  well-developed.  Cardiovascular:     Rate and Rhythm: Normal rate and regular rhythm.     Heart sounds: Normal heart sounds. No murmur heard. Pulmonary:     Effort: Pulmonary effort is normal. No respiratory distress.     Breath sounds: Normal breath sounds. No wheezing.  Chest:     Chest wall: No tenderness.  Musculoskeletal:  General: Normal range of motion.     Cervical back: Normal range of motion and neck supple.  Skin:    General: Skin is warm and dry.     Capillary Refill: Capillary refill takes less than 2 seconds.  Neurological:     General: No focal deficit present.     Mental Status: She is alert and oriented to person, place, and time.     Cranial Nerves: No cranial nerve deficit.  Psychiatric:        Mood and Affect: Mood normal.        Behavior: Behavior normal.        Thought Content: Thought content normal.        Judgment: Judgment normal.         Assessment And Plan:  Type 2 diabetes mellitus with both eyes affected by mild nonproliferative retinopathy without macular edema, without long-term current use of insulin (HCC) Assessment & Plan: Chronic, continue f/u with Endocrinology.   Orders: -     Hemoglobin A1c -     Microalbumin / creatinine urine ratio  Hypothyroidism, unspecified type Assessment & Plan: Continue follow-up with endocrinologist.  No recent changes with her thyroid medication.  Orders: -     TSH + free T4  Mixed hyperlipidemia Assessment & Plan: Cholesterol levels have been normal at last check.  Continue statin.  Orders: -     Lipid panel  COVID-19 vaccination declined Assessment & Plan: Declines covid 19 vaccine. Discussed risk of covid 35 and if she changes her mind about the vaccine to call the office. Education has been provided regarding the importance of this vaccine but patient still declined. Advised may receive this vaccine at local pharmacy or Health Dept.or vaccine clinic. Aware to provide a copy of the  vaccination record if obtained from local pharmacy or Health Dept.  Encouraged to take multivitamin, vitamin d, vitamin c and zinc to increase immune system. Aware can call office if would like to have vaccine here at office. Verbalized acceptance and understanding.    Herpes zoster vaccination declined Assessment & Plan: Declines shingrix, educated on disease process and is aware if he changes his mind to notify office    History of colon cancer  Other long term (current) drug therapy  Pre-op exam Assessment & Plan: She is preparing for hysterectomy and reconstruction to colorectal area. She may need an EKG pending her surgery date.  Orders: -     CBC with Differential/Platelet -     BMP8+eGFR  Other orders -     Mounjaro; Inject 7.5 mg into the skin once a week.    Return for controlled DM check 4 months.  Patient was given opportunity to ask questions. Patient verbalized understanding of the plan and was able to repeat key elements of the plan. All questions were answered to their satisfaction.    Jeanell Sparrow, FNP, have reviewed all documentation for this visit. The documentation on 12/09/23 for the exam, diagnosis, procedures, and orders are all accurate and complete.   IF YOU HAVE BEEN REFERRED TO A SPECIALIST, IT MAY TAKE 1-2 WEEKS TO SCHEDULE/PROCESS THE REFERRAL. IF YOU HAVE NOT HEARD FROM US/SPECIALIST IN TWO WEEKS, PLEASE GIVE [place] A CALL AT [phone].

## 2023-12-11 ENCOUNTER — Encounter: Payer: Self-pay | Admitting: Nurse Practitioner

## 2023-12-11 LAB — CBC WITH DIFFERENTIAL/PLATELET
Basophils Absolute: 0 10*3/uL (ref 0.0–0.2)
Basos: 1 %
EOS (ABSOLUTE): 0 10*3/uL (ref 0.0–0.4)
Eos: 1 %
Hematocrit: 49 % — ABNORMAL HIGH (ref 34.0–46.6)
Hemoglobin: 15.9 g/dL (ref 11.1–15.9)
Immature Grans (Abs): 0 10*3/uL (ref 0.0–0.1)
Immature Granulocytes: 0 %
Lymphocytes Absolute: 1.4 10*3/uL (ref 0.7–3.1)
Lymphs: 35 %
MCH: 29.9 pg (ref 26.6–33.0)
MCHC: 32.4 g/dL (ref 31.5–35.7)
MCV: 92 fL (ref 79–97)
Monocytes Absolute: 0.2 10*3/uL (ref 0.1–0.9)
Monocytes: 6 %
Neutrophils Absolute: 2.3 10*3/uL (ref 1.4–7.0)
Neutrophils: 57 %
Platelets: 220 10*3/uL (ref 150–450)
RBC: 5.31 x10E6/uL — ABNORMAL HIGH (ref 3.77–5.28)
RDW: 12.5 % (ref 11.7–15.4)
WBC: 4 10*3/uL (ref 3.4–10.8)

## 2023-12-11 LAB — LIPID PANEL
Chol/HDL Ratio: 2.5 {ratio} (ref 0.0–4.4)
Cholesterol, Total: 155 mg/dL (ref 100–199)
HDL: 62 mg/dL (ref >39)
LDL Chol Calc (NIH): 72 mg/dL (ref 0–99)
Triglycerides: 120 mg/dL (ref 0–149)
VLDL Cholesterol Cal: 21 mg/dL (ref 5–40)

## 2023-12-11 LAB — TSH+FREE T4
Free T4: 1.56 ng/dL (ref 0.82–1.77)
TSH: 3.81 u[IU]/mL (ref 0.450–4.500)

## 2023-12-11 LAB — BMP8+EGFR
BUN/Creatinine Ratio: 20 (ref 9–23)
BUN: 20 mg/dL (ref 6–24)
CO2: 24 mmol/L (ref 20–29)
Calcium: 10.3 mg/dL — ABNORMAL HIGH (ref 8.7–10.2)
Chloride: 101 mmol/L (ref 96–106)
Creatinine, Ser: 1.01 mg/dL — ABNORMAL HIGH (ref 0.57–1.00)
Glucose: 103 mg/dL — ABNORMAL HIGH (ref 70–99)
Potassium: 4.3 mmol/L (ref 3.5–5.2)
Sodium: 141 mmol/L (ref 134–144)
eGFR: 65 mL/min/{1.73_m2} (ref >59)

## 2023-12-11 LAB — MICROALBUMIN / CREATININE URINE RATIO
Creatinine, Urine: 145.5 mg/dL
Microalb/Creat Ratio: 5 mg/g{creat} (ref 0–29)
Microalbumin, Urine: 7.4 ug/mL

## 2023-12-11 LAB — HEMOGLOBIN A1C
Est. average glucose Bld gHb Est-mCnc: 143 mg/dL
Hgb A1c MFr Bld: 6.6 % — ABNORMAL HIGH (ref 4.8–5.6)

## 2023-12-19 NOTE — Assessment & Plan Note (Signed)
 Cholesterol levels have been normal at last check.  Continue statin.

## 2023-12-19 NOTE — Assessment & Plan Note (Signed)
 Chronic, continue f/u with Endocrinology.

## 2023-12-19 NOTE — Assessment & Plan Note (Signed)

## 2023-12-19 NOTE — Assessment & Plan Note (Signed)
 She is preparing for hysterectomy and reconstruction to colorectal area. She may need an EKG pending her surgery date.

## 2023-12-19 NOTE — Assessment & Plan Note (Signed)
 Declines shingrix, educated on disease process and is aware if he changes his mind to notify office

## 2023-12-19 NOTE — Assessment & Plan Note (Signed)
 Continue follow-up with endocrinologist.  No recent changes with her thyroid medication.

## 2024-01-19 ENCOUNTER — Encounter: Payer: No Typology Code available for payment source | Admitting: Gynecologic Oncology

## 2024-01-19 ENCOUNTER — Other Ambulatory Visit: Payer: Self-pay

## 2024-01-19 ENCOUNTER — Inpatient Hospital Stay: Payer: No Typology Code available for payment source | Attending: Psychiatry | Admitting: Psychiatry

## 2024-01-19 VITALS — BP 113/70 | HR 68 | Temp 99.6°F | Resp 19 | Wt 176.0 lb

## 2024-01-19 DIAGNOSIS — N823 Fistula of vagina to large intestine: Secondary | ICD-10-CM | POA: Diagnosis not present

## 2024-01-19 DIAGNOSIS — Z9221 Personal history of antineoplastic chemotherapy: Secondary | ICD-10-CM | POA: Diagnosis not present

## 2024-01-19 DIAGNOSIS — Z85038 Personal history of other malignant neoplasm of large intestine: Secondary | ICD-10-CM | POA: Diagnosis not present

## 2024-01-19 DIAGNOSIS — Z923 Personal history of irradiation: Secondary | ICD-10-CM | POA: Diagnosis not present

## 2024-01-19 DIAGNOSIS — N879 Dysplasia of cervix uteri, unspecified: Secondary | ICD-10-CM | POA: Diagnosis present

## 2024-01-19 DIAGNOSIS — N893 Dysplasia of vagina, unspecified: Secondary | ICD-10-CM | POA: Insufficient documentation

## 2024-01-19 DIAGNOSIS — N829 Female genital tract fistula, unspecified: Secondary | ICD-10-CM

## 2024-01-19 NOTE — Progress Notes (Signed)
 Gynecologic Oncology Return Clinic Visit  Date of Service: 01/19/2024 Referring Provider: Dickey Fought, MD [facility]  Assessment & Plan: [name] is a 58 y.o. 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   CIN3/VAIN3: -Reviewed cervical and vaginal dysplasia history today. - Patient with poor healing following prior procedures and agglutination of vaginal apex limiting safe ongoing monitoring of the cervix in the setting of high-grade dysplasia history. - Imaging has since demonstrated a rectovaginal fistula which is in the context of patient's prior colorectal cancer history for which she underwent surgery and radiation treatment. - Patient has been evaluated by Dr. Scott Cutting at [facility]. - Reviewed joint procedure for surgical management. - Reviewed possible outcomes.  It is possible that we may be able to remove the uterus and cervix from the vaginal apex without having to address the fistula but then this would mean persistent rectovaginal fistula.  Also possible that it will be necessary to encounter the fistula in order to mobilize the uterus and the cervix off of the colon.  This is why we have chosen to do a joint procedure with surgical oncology at [facility].  She is aware of possible bowel outcomes as previously discussed with Dr. Scott Cutting. Patient was consented for: Robotic assisted total hysterectomy and bilateral salpingo-oophorectomy on 03/10/24.  The risks of surgery were discussed in detail and she understands these to including but not limited to bleeding requiring a blood transfusion, infection, injury to adjacent organs (including but not limited to the bowels, bladder, ureters, nerves, blood vessels), thromboembolic events, wound separation,  hernia, vaginal cuff separation, unforseen complication, possible need for re-exploration, and medical complications such as heart attack, stroke, pneumonia.  If the patient experiences any of these events, she understands that her hospitalization or recovery may be prolonged and that she may need to take additional medications for a prolonged period. The patient will receive DVT and antibiotic prophylaxis as indicated. She voiced a clear understanding. She had the opportunity to ask questions and informed consent was obtained today. She wishes to proceed.  She will proceed with colonoscopy this Wednesday and imaging at [facility] on 4/14 as scheduled. She does not require preoperative clearance. Her METs are >4.  Preoperative instructions reviewed but discussed that will defer to Dr. Enola Hartigan team regarding bowel prep.  Plan for postop at [facility], 2-4 weeks postop   Derrel Flies, MD Gynecologic Oncology   Medical Decision Making I personally spent  TOTAL 45 minutes face-to-face and non-face-to-face in the care of this patient, which includes all pre, intra, and post visit time on the date of service.   ----------------------- Reason for Visit: Follow-up  Treatment History: - 05/07/2022 - Pap: ASCUS-H, HPV high risk detected - 05/28/2022 - Colposcopy: 10:00 biopsy HSIL, ECC HSIL - 07/02/2022 - LEEP and laser of cervical and vaginal dysplasia: Decreased Lugol's uptake of the cervix in the upper vagina.  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 - 09/08/23 - PAP NILM, HPV negative (transformation zone component absent)  Interval History: Today, patient reports that she continues with vaginal estrogen.  Denies  vaginal bleeding or itching. Has occasional leakage of stool and gas through the vagina when she is having loose stools.  She feels it is unpredictable when her stools will be loose.  Has scheduled imaging on 02/02/2024 and follow-up with Dr.Stitzenberg at that time.  Has colonoscopy this Wednesday with Dr. Tova Fresh at [facility].  We have scheduled her for surgery at [facility] in 03/10/2024.   Past Medical/Surgical History: Past Medical History:  Diagnosis Date   Anticoagulant long-term use    eliquis--  managed by dr Scherrie Curt and pcp   Cancer of colon with rectum [facility]) 12/2010   oncologist--- dr Scherrie Curt;  dx whille pt in hospital had work-up with positive PE started coumadin GI bleed colonscopy positive polyp;  IVC placed prior to low anterior resection of rectum 01-31-2011; Stage III,  completed chemoradiation 09/ 2012   History of cancer chemotherapy    completed 09/ 2012   History of cervical dysplasia    History of COVID-19 03/10/2020   positive covid then admitted 03-21-2020 with increased sob , dx pneumonia MRI 03-22-2020  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 Ardell Beauvais ;   resolved per cardiology lov 04-03-2022;   normal ETT and nuclear stress test 10/ 2007   History of pulmonary embolus (PE) 12/22/2010   in setting taking oral contraceptive;   s/p IVC filter placed 01-24-2011 prior to colon surgery;   03-23-2023  IVC thrombosis in setting covid complicated thrombectomy with tear with removal s/p stenting   History of radiation therapy    03-19-2011 to 04-30-2011 for rectal cancer   History of vaginal dysplasia    Hyperlipidemia, mixed    Hypothyroidism    folllowed by dr Ronelle Coffee   IDA (iron deficiency anemia)    Migraines    Type 2 diabetes mellitus (HCC)    followed by dr  Ronelle Coffee   (12-13-2022  per pt checks blood sugar bid ,  fasting average 122-133)   Urgency of urination    VIN III (vulvar intraepithelial neoplasia III)    Wears glasses     Past Surgical History:  Procedure Laterality Date   AORTA - BILATERAL FEMORAL ARTERY BYPASS GRAFT N/A 03/29/2020   Procedure: VENO VENOUS BYPASS;  Surgeon: Adine Hoof, MD;  [facility] OR;  Service: Vascular;  Laterality: N/A;   APPLICATION OF ANGIOVAC N/A 03/29/2020   Procedure: [facility] MECHANICAL THROMBECTOMY;  Surgeon: Adine Hoof, MD;  Location[facility] OR;  Service: Vascular;  Laterality: N/A;   CESAREAN SECTION  07/25/2004   @[facility]    CO2 LASER APPLICATION N/A 07/02/2022   Procedure: CO2 LASER APPLICATION of distal cervix and upper vagina;  Surgeon: Jertson, Jill Evelyn, MD;  Location: [facility];  Service: Gynecology;  Laterality: N/A;   ENDOVASCULAR STENT INSERTION N/A 03/29/2020   Procedure: Inferior Vena Cava WallStent Graft Insertion;  Surgeon: Adine Hoof, MD;  Location: [facility] OR;  Service: Vascular;  Laterality: N/A;   INSERTION OF VENA CAVA FILTER Right 02/22/2011   by dr Farrel Hones   LEEP N/A 07/02/2022   Procedure: LOOP ELECTROSURGICAL EXCISION PROCEDURE (LEEP) and colposcopy;  Surgeon: Wanita Gutta, MD;  Location: [facility];  Service: Gynecology;  Laterality: N/A;   LOW ANTERIOR BOWEL RESECTION  01/31/2011   @[facility]  by  dr Tacy Expose;  of rectum   OPERATIVE ULTRASOUND N/A 12/17/2022   Procedure: OPERATIVE ULTRASOUND;  Surgeon: Derrel Flies, MD;  Location: [facility];  Service: Gynecology;  Laterality: N/A;  Dr.Ayven Pheasant ran US    PORT-A-CATH REMOVAL Right 12/22/2012   Procedure: REMOVAL PORT-A-CATH;  Surgeon: Harlee Lichtenstein, MD;  Location: [facility];  Service: General;  Laterality: Right;   PORTACATH PLACEMENT  02/21/2011   dr Tacy Expose   THROMBECTOMY ILIAC ARTERY N/A 03/29/2020   Procedure:  THROMBECTOMY ILIAC ARTERY MECHANICAL THROMBECTOMY;  Surgeon: Adine Hoof, MD;  Location: [facility] OR;  Service: Vascular;  Laterality: N/A;    Family History  Problem Relation Age of Onset   Lung cancer Mother    Diabetes Mother    Hypertension Mother    Heart disease Father    Diabetes Brother    Kidney disease Brother    Cervical cancer Paternal Grandmother    Breast cancer Neg Hx    Colon cancer Neg Hx    Ovarian cancer Neg Hx    Endometrial cancer Neg Hx    Pancreatic cancer Neg Hx    Prostate cancer Neg Hx     Social History   Socioeconomic History   Marital status: Married    Spouse name: Not on file   Number of children: 1   Years of education: Not on file   Highest education level: Not on file  Occupational History   Occupation: Engineering geologist  Tobacco Use   Smoking status: Never   Smokeless tobacco: Never  Vaping Use   Vaping status: Never Used  Substance and Sexual Activity   Alcohol use: Not Currently    Comment: rarely   Drug use: Never   Sexual activity: Not on file  Other Topics Concern   Not on file  Social History Narrative   Not on file   Social Drivers of Health   Financial Resource Strain: Not on file  Food Insecurity: No Food Insecurity (11/21/2022)   Hunger Vital Sign    Worried About Running Out of Food in the Last Year: Never true    Ran Out of Food in the Last Year: Never true  Transportation Needs: No Transportation Needs (11/21/2022)   PRAPARE - Administrator, Civil Service (Medical): No    Lack of Transportation (Non-Medical): No  Physical Activity: Not on file  Stress: Not on file  Social Connections: Moderately Integrated (11/21/2022)   Social Connection and Isolation Panel [NHANES]    Frequency of Communication with Friends and Family: More than three times a week    Frequency of Social Gatherings with Friends and Family: Not on file    Attends Religious Services: More than 4 times per year    Active Member of  [employer] or Organizations: No    Attends Engineer, structural: Not on file    Marital Status: Married    Current Medications:  Current Outpatient Medications:    Accu-Chek Comptroller, twice a day for 30 days, Disp: , Rfl:    Aspirin-Acetaminophen-Caffeine (EXCEDRIN MIGRAINE PO), Take by mouth as needed., Disp: , Rfl:    Blood Glucose Monitoring Suppl (ACCU-CHEK GUIDE) w/Device KIT, as directed twice a day for 30 days, Disp: , Rfl:    cholecalciferol (VITAMIN D) 400 UNITS TABS tablet, Take 400 Units by mouth daily. , Disp: , Rfl:    cyanocobalamin 500 MCG tablet, Take 500 mcg by mouth daily. Vitamin b 12, Disp: , Rfl:  ELIQUIS 2.5 MG TABS tablet, Take 2.5 mg by mouth 2 (two) times daily., Disp: , Rfl:    empagliflozin (JARDIANCE) 25 MG TABS tablet, 1 tablet Orally Once a day, Disp: , Rfl:    Ferrous Sulfate (IRON) 325 (65 Fe) MG TABS, Take 1 tablet by mouth daily., Disp: , Rfl:    glucose blood (ACCU-CHEK GUIDE) test strip, as directed In Vitro twice a day for 30 days, Disp: , Rfl:    Insulin Lispro (HUMALOG [place]), Inject 10 Units into the skin 3 (three) times daily with meals. , Disp: , Rfl:    Insulin Lispro Prot & Lispro (HUMALOG 75/25 MIX) (75-25) 100 UNIT/ML Kwikpen, ADMINISTER 10 UNITS UNDER THE SKIN THREE TIMES DAILY AS DIRECTED, Disp: , Rfl:    JARDIANCE 25 MG TABS tablet, Take 25 mg by mouth daily., Disp: , Rfl:    levothyroxine (SYNTHROID, LEVOTHROID) 100 MCG tablet, Take 100 mcg by mouth daily before breakfast., Disp: , Rfl:    Magnesium 500 MG TABS, Take 1 tablet by mouth at bedtime., Disp: , Rfl:    Multiple Vitamin (MULTIVITAMIN) tablet, Take 1 tablet by mouth daily., Disp: , Rfl:    rosuvastatin (CRESTOR) 10 MG tablet, Take 10 mg by mouth daily., Disp: , Rfl:    tirzepatide (MOUNJARO) 7.5 MG/0.5ML Pen, Inject 7.5 mg into the skin once a week., Disp: , Rfl:    Zinc 100 MG TABS, Take 1 tablet by mouth daily., Disp: , Rfl:    estradiol (ESTRACE) 0.1 MG/GM vaginal  cream, Insert 1 gram intravaginally 3 x a week at bedtime. Do not use after surgery unless directed by Dr. Daisey Dryer, Disp: 42.5 g, Rfl: 1  Review of Symptoms: Complete 10-system review is positive for: none  Physical Exam: BP 113/70 (BP Location: Left Arm, Patient Position: Sitting)   Pulse 68   Temp 99.6 F (37.6 C) (Oral)   Resp 19   Wt 176 lb (79.8 kg)   LMP 09/12/2011   SpO2 100%   BMI 24.55 kg/m  General: Alert, oriented, no acute distress. HEENT: Normocephalic, atraumatic. Neck symmetric without masses. Sclera anicteric.  Chest: Normal work of breathing. Clear to auscultation bilaterally.   Cardiovascular: Regular rate and rhythm, no murmurs. Abdomen: Soft, nontender.  Normoactive bowel sounds.   Extremities: Grossly normal range of motion.  Warm, well perfused.  No edema bilaterally. Skin: No rashes or lesions noted.   Laboratory & Radiologic Studies: none

## 2024-01-19 NOTE — Patient Instructions (Signed)
 It was a pleasure to see you in clinic today. - We discussed plan for surgery is hysterectomy (removal of uterus and cervix) and bilateral salpingo-oophorectomy (removal of fallopian tubes and ovaries - Return visit planned for 2-4 weeks postop at [facility]. We will get this scheduled.  Thank you very much for allowing me to provide care for you today.  I appreciate your confidence in choosing our Gynecologic Oncology team at [facility].  If you have any questions about your visit today please call our office or send [place] a MyChart message and we will get back to you as soon as possible.

## 2024-01-21 LAB — HM COLONOSCOPY

## 2024-01-22 ENCOUNTER — Telehealth: Payer: Self-pay | Admitting: *Deleted

## 2024-01-22 DIAGNOSIS — N879 Dysplasia of cervix uteri, unspecified: Secondary | ICD-10-CM

## 2024-01-22 DIAGNOSIS — N893 Dysplasia of vagina, unspecified: Secondary | ICD-10-CM

## 2024-01-22 NOTE — Telephone Encounter (Signed)
 Spoke with [name]. She is requesting a refill (new Rx) sent to her pharmacy for estradiol (estrace) 0.1mg /gm vaginal cream she takes 1gm 3x a week at bedtime.  Pt states original Rx is from her gyn but she is retiring and at her last visit with Dr. Alvester Morin on 01/19/24, she was asked if she needed a refill. Pt has about a week left of her cream. Advised that her message would be relayed to providers and the office would call back once Rx has been sent. Pt thanked the office.

## 2024-01-23 MED ORDER — ESTRADIOL 0.1 MG/GM VA CREA: TOPICAL_CREAM | VAGINAL | 1 refills | Status: AC

## 2024-01-23 NOTE — Telephone Encounter (Signed)
 Relayed information to patient that refill on Estradiol cream sent to her pharmacy and that she should use this cream before surgery but not after surgery unless specifically directed to by Dr Alvester Morin, as we usually do not want anything in the vagina after surgery. Patient verbalized understanding and had no other concerns at this time.

## 2024-02-02 ENCOUNTER — Encounter: Payer: Self-pay | Admitting: Psychiatry

## 2024-02-03 ENCOUNTER — Telehealth: Payer: Self-pay

## 2024-02-03 NOTE — Telephone Encounter (Signed)
-----   Message from [place], [place] sent at 02/02/2024  6:20 PM EDT ----- Regarding: colonoscopy When we try to get this pt's colonoscopy results from the beginning of April that she had done at [facility]?  Thanks, Heidi Llamas

## 2024-02-03 NOTE — Telephone Encounter (Signed)
 Ms. Hassey states her Colonoscopy was rescheduled for 02/11/24 @ [facility].

## 2024-02-03 NOTE — Telephone Encounter (Signed)
 Per [facility], [facility], Colonoscopy was scheduled on 01/21/24, preparation of the colon was inadequate, no specimens collected. Colonoscopy is rescheduled.   LVM for [name] to call office regarding colonoscopy.

## 2024-02-11 LAB — HM COLONOSCOPY

## 2024-02-16 ENCOUNTER — Encounter: Payer: No Typology Code available for payment source | Admitting: Psychiatry

## 2024-02-23 ENCOUNTER — Encounter (HOSPITAL_BASED_OUTPATIENT_CLINIC_OR_DEPARTMENT_OTHER): Payer: Self-pay

## 2024-02-23 ENCOUNTER — Encounter (HOSPITAL_BASED_OUTPATIENT_CLINIC_OR_DEPARTMENT_OTHER)

## 2024-02-23 ENCOUNTER — Other Ambulatory Visit (HOSPITAL_COMMUNITY): Payer: Self-pay | Admitting: Surgical Oncology

## 2024-02-23 DIAGNOSIS — I82220 Acute embolism and thrombosis of inferior vena cava: Secondary | ICD-10-CM

## 2024-02-24 ENCOUNTER — Ambulatory Visit (HOSPITAL_BASED_OUTPATIENT_CLINIC_OR_DEPARTMENT_OTHER)
Admission: RE | Admit: 2024-02-24 | Discharge: 2024-02-24 | Disposition: A | Discharge status: Home / Self Care | Source: Ambulatory Visit | Attending: Surgical Oncology | Admitting: Surgical Oncology

## 2024-02-24 DIAGNOSIS — I82220 Acute embolism and thrombosis of inferior vena cava: Secondary | ICD-10-CM | POA: Diagnosis present

## 2024-03-16 ENCOUNTER — Telehealth: Payer: Self-pay

## 2024-03-16 NOTE — Transitions of Care (Post Inpatient/ED Visit) (Signed)
   03/16/2024  Name: [name] MRN: [id number] DOB: 17-Sep-1966  Today's TOC FU Call Status: Today's TOC FU Call Status:: Unsuccessful Call (1st Attempt) Unsuccessful Call (1st Attempt) Date: 03/16/24  Attempted to reach the patient regarding the most recent Inpatient/ED visit from [facility].   Follow Up Plan: Additional outreach attempts will be made to reach the patient to complete the Transitions of Care (Post Inpatient/ED visit) call.    Katheryn Pandy MSN, RN RN Case Sales executive Health  [facility]-Population Health Office Hours M-F [phone] Direct Dial: [phone] Main Phone [phone]  Fax: [phone] [email or web address]

## 2024-03-17 ENCOUNTER — Telehealth: Payer: Self-pay

## 2024-03-17 NOTE — Transitions of Care (Post Inpatient/ED Visit) (Signed)
   03/17/2024  Name: [name] MRN: [id number] DOB: 10/24/1965  Today's TOC FU Call Status: Today's TOC FU Call Status:: Unsuccessful Call (2nd Attempt) Unsuccessful Call (2nd Attempt) Date: 03/17/24  Attempted to reach the patient regarding the most recent Inpatient/ED visit. [facility] Transitions of Care RN had just called patient.   Follow Up Plan: Additional outreach attempts will be made to reach the patient to complete the Transitions of Care (Post Inpatient/ED visit) call.    Katheryn Pandy MSN, RN RN Case Sales executive Health  [facility]BC[facility]-Population Health Office Hours M-F [phone] Direct Dial: [phone] Main Phone [phone]  Fax: [phone] [email or web address]

## 2024-03-18 ENCOUNTER — Telehealth: Payer: Self-pay

## 2024-03-18 NOTE — Transitions of Care (Post Inpatient/ED Visit) (Signed)
   03/18/2024  Name: [name] MRN: [id number] DOB: [birth year]  Today's TOC FU Call Status: Today's TOC FU Call Status:: Unsuccessful Call (3rd Attempt) Unsuccessful Call (3rd Attempt) Date: 03/18/24  Attempted to reach the patient regarding the most recent Inpatient/ED visit.:  Unsuccessful telephone outreach attempt #3 made. A HIPAA compliant phone message was left for the patient providing contact information and requesting a return call if desired.   Upon chart review for this outreach is was noted by RN CM that patient has multiple contacts and follow ups scheduled with [facility], post discharge outreaches by [employer], a PCP appointment on 04/07/24, and Endocrinologist, Dr Ronelle Coffee,  on 03/19/24.    Follow Up Plan: No further outreach attempts will be made at this time. We have been unable to contact the patient.   Katheryn Pandy MSN, RN RN Case Sales executive Health  [facility]BC[facility]-Population Health Office Hours M-F [phone] Direct Dial: [phone] Main Phone [phone]  Fax: [phone] [email or web address]

## 2024-03-18 NOTE — Telephone Encounter (Signed)
 Error

## 2024-04-07 ENCOUNTER — Encounter: Payer: Self-pay | Admitting: Nurse Practitioner

## 2024-04-07 ENCOUNTER — Ambulatory Visit: Payer: No Typology Code available for payment source | Admitting: Nurse Practitioner

## 2024-04-07 VITALS — BP 100/60 | HR 81 | Temp 98.7°F | Ht 71.0 in | Wt 178.8 lb

## 2024-04-07 DIAGNOSIS — E113293 Type 2 diabetes mellitus with mild nonproliferative diabetic retinopathy without macular edema, bilateral: Secondary | ICD-10-CM | POA: Diagnosis not present

## 2024-04-07 DIAGNOSIS — E782 Mixed hyperlipidemia: Secondary | ICD-10-CM

## 2024-04-07 DIAGNOSIS — E039 Hypothyroidism, unspecified: Secondary | ICD-10-CM | POA: Diagnosis not present

## 2024-04-07 DIAGNOSIS — Z933 Colostomy status: Secondary | ICD-10-CM | POA: Insufficient documentation

## 2024-04-07 DIAGNOSIS — Z932 Ileostomy status: Secondary | ICD-10-CM | POA: Insufficient documentation

## 2024-04-07 DIAGNOSIS — Z85038 Personal history of other malignant neoplasm of large intestine: Secondary | ICD-10-CM

## 2024-04-07 DIAGNOSIS — Z2821 Immunization not carried out because of patient refusal: Secondary | ICD-10-CM

## 2024-04-07 NOTE — Progress Notes (Signed)
 LILLETTE Kristeen JINNY Gladis, CMA,acting as a Neurosurgeon for [name] Ada, [name].,have documented all relevant documentation on the behalf of [name] Ada, [name],as directed by  [name] Ada, [name] while in the presence of [name] Ada, [name].  Subjective:  Patient ID: [name] , female    DOB: 04-17-1966 , 58 y.o.   MRN: [id number]  Chief Complaint  Patient presents with   Diabetes    Patient presents today for a chol and dm follow up, Patient reports compliance with medication. Patient denies any chest pain, SOB, or headaches. Patient also had a hysterotomy and a repair of her colon. Patient had surgery May 21st, she reports she is having issues with stool coming out of her vagina. She is going for a CT soon and she has spoke with her Careers adviser.  She reports she also reports she now has a ostomy bag and she had trouble with the bag and how it is raw around her stoma.     HPI  She had surgery for hysterectomy.  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 CT scan abdomen will be on July 10th to determine next steps. Dr. In [facility] - she also saw damage to small intestine and had to remove a portion. She has been seeing a nurse to help with her ileostomy leaking. This was causing her rawness and soreness to her stoma and surrounding skin. She has not had any leakage since Monday when seeing the nurse.   She is not back at work at this time.  She has seen hematology/oncology and recommended to continue blood thinners. Since she has scar tissue around her IVC area encouraged to continue blood thinner and to f/u in one.    Diabetes She presents for her follow-up diabetic visit. She has type 2 diabetes mellitus. Her disease course has been improving. Pertinent negatives for hypoglycemia include no dizziness, headaches or nervousness/anxiousness. Pertinent negatives for diabetes include no fatigue, no polydipsia, no polyphagia and no polyuria. There are no hypoglycemic complications. Diabetic complications include  retinopathy. Risk factors for coronary artery disease include sedentary lifestyle and diabetes mellitus. Current diabetic treatment includes oral agent (monotherapy). She is following a diabetic diet. (Blood sugar average 123 she is wearing a libre. ) Eye exam is current.     Past Medical History:  Diagnosis Date   Anticoagulant long-term use    eliquis --  managed by dr cloretta and pcp   Cancer of colon with rectum [facility]) 12/2010   oncologist--- dr cloretta;  dx whille pt in hospital had work-up with positive PE started coumadin  GI bleed colonscopy positive polyp;  IVC placed prior to low anterior resection of rectum 01-31-2011; Stage III,  completed chemoradiation 09/ 2012   History of cancer chemotherapy    completed 09/ 2012   History of cervical dysplasia    History of COVID-19 03/10/2020   positive covid then admitted 03-21-2020 with increased sob , dx pneumonia MRI 03-22-2020  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 thrombectomy with tear with removal s/p stenting   History of radiation therapy    03-19-2011 to 04-30-2011 for rectal cancer   History of vaginal dysplasia    Hyperlipidemia, mixed    Hypothyroidism    folllowed by dr tommas   IDA (iron deficiency anemia)    Migraines    Type 2 diabetes mellitus (HCC)    followed by dr tommas   (12-13-2022  per pt checks blood sugar bid ,  fasting average 122-133)   Urgency of urination    VIN III (vulvar intraepithelial neoplasia  III)    Wears glasses      Family History  Problem Relation Age of Onset   Lung cancer Mother    Diabetes Mother    Hypertension Mother    Heart disease Father    Diabetes Brother    Kidney disease Brother    Cervical cancer Paternal Grandmother    Breast cancer Neg Hx    Colon cancer Neg Hx    Ovarian cancer Neg Hx    Endometrial cancer Neg Hx    Pancreatic cancer Neg Hx    Prostate cancer Neg Hx      Current Outpatient Medications:    Accu-Chek FastClix Lancets MISC, twice a day for 30 days, Disp: , Rfl:    Aspirin-Acetaminophen -Caffeine (EXCEDRIN MIGRAINE PO), Take by mouth as needed., Disp: , Rfl:    Blood Glucose Monitoring Suppl (ACCU-CHEK GUIDE) w/Device KIT, as directed twice a day for 30 days, Disp: , Rfl:    cholecalciferol (VITAMIN D ) 400 UNITS TABS tablet, Take 400 Units by mouth daily. , Disp: , Rfl:    cyanocobalamin 500 MCG tablet, Take 500 mcg by mouth daily. Vitamin b 12, Disp: , Rfl:    ELIQUIS  2.5 MG TABS tablet, Take 2.5 mg by mouth 2 (two) times daily., Disp: , Rfl:    empagliflozin (JARDIANCE) 25 MG TABS tablet, 1 tablet Orally Once a day, Disp: , Rfl:    estradiol  (ESTRACE ) 0.1 MG/GM vaginal cream, Insert 1 gram intravaginally 3 x a week at bedtime. Do not use after surgery unless directed by Dr. Eldonna, Disp: 42.5 g, Rfl: 1   Ferrous Sulfate (IRON) 325 (65 Fe) MG TABS, Take 1 tablet by mouth daily., Disp: , Rfl:    glucose blood (ACCU-CHEK GUIDE) test strip, as directed In Vitro twice a day for 30 days, Disp: , Rfl:    Insulin  Lispro (HUMALOG [place]), Inject 10 Units into the skin 3 (three) times daily with meals. , Disp: , Rfl:    Insulin  Lispro Prot & Lispro (HUMALOG 75/25 MIX) (75-25) 100 UNIT/ML Kwikpen, ADMINISTER 10 UNITS UNDER THE SKIN THREE TIMES DAILY AS DIRECTED, Disp: , Rfl:    JARDIANCE 25 MG TABS tablet, Take 25 mg by mouth daily., Disp: , Rfl:    levothyroxine  (SYNTHROID , LEVOTHROID) 100 MCG tablet, Take 100 mcg by mouth daily before breakfast.,  Disp: , Rfl:    Magnesium  500 MG TABS, Take 1 tablet by mouth at bedtime., Disp: , Rfl:    Multiple Vitamin (MULTIVITAMIN) tablet, Take 1 tablet by mouth daily., Disp: , Rfl:    rosuvastatin (CRESTOR) 10 MG tablet, Take 10 mg by mouth daily., Disp: , Rfl:    tirzepatide  (MOUNJARO ) 7.5 MG/0.5ML Pen, Inject 7.5 mg into the skin once a week., Disp: , Rfl:    Zinc  100 MG TABS, Take 1 tablet by mouth daily., Disp: , Rfl:    Allergies  Allergen Reactions  Penicillins Hives   Metformin And Related Other (See Comments)    Chest pain   Levofloxacin  Other (See Comments)    Parathesia     Review of Systems  Constitutional:  Negative for fatigue.  Respiratory: Negative.    Cardiovascular: Negative.   Endocrine: Negative for polydipsia, polyphagia and polyuria.  Neurological:  Negative for dizziness and headaches.  Psychiatric/Behavioral: Negative.  The patient is not nervous/anxious.      Today's Vitals   04/07/24 1511  BP: 100/60  Pulse: 81  Temp: 98.7 F (37.1 C)  TempSrc: Oral  Weight: 178 lb 12.8 oz (81.1 kg)  Height: 5' 11 (1.803 m)  PainSc: 3   PainLoc: Abdomen   Body mass index is 24.94 kg/m.  Wt Readings from Last 3 Encounters:  04/07/24 178 lb 12.8 oz (81.1 kg)  01/19/24 176 lb (79.8 kg)  12/09/23 181 lb (82.1 kg)      Objective:  Physical Exam Vitals and nursing note reviewed.  Constitutional:      General: She is not in acute distress.    Appearance: Normal appearance. She is well-developed.   Cardiovascular:     Rate and Rhythm: Normal rate and regular rhythm.     Heart sounds: Normal heart sounds. No murmur heard. Pulmonary:     Effort: Pulmonary effort is normal. No respiratory distress.     Breath sounds: Normal breath sounds. No wheezing.  Chest:     Chest wall: No tenderness.   Musculoskeletal:        General: Normal range of motion.     Cervical back: Normal range of motion and neck supple.   Skin:    General: Skin is warm and dry.      Capillary Refill: Capillary refill takes less than 2 seconds.   Neurological:     General: No focal deficit present.     Mental Status: She is alert and oriented to person, place, and time.     Cranial Nerves: No cranial nerve deficit.   Psychiatric:        Mood and Affect: Mood normal.        Behavior: Behavior normal.        Thought Content: Thought content normal.        Judgment: Judgment normal.         Assessment And Plan:  Type 2 diabetes mellitus with both eyes affected by mild nonproliferative retinopathy without macular edema, without long-term current use of insulin  (HCC) -     Hemoglobin A1c  Hypothyroidism, unspecified type Assessment & Plan: Continue follow-up with endocrinologist.  No recent changes with her thyroid  medication.  Orders: -     TSH + free T4  Mixed hyperlipidemia Assessment & Plan: Cholesterol levels have been normal at last check.  Continue statin.  Orders: -     Lipid panel  Herpes zoster vaccination declined Assessment & Plan: Declines shingrix, educated on disease process and is aware if he changes his mind to notify office    COVID-19 vaccination declined Assessment & Plan: Declines covid 19 vaccine. Discussed risk of covid 41 and if she changes her mind about the vaccine to call the office. Education has been provided regarding the importance of this vaccine but patient still declined. Advised may receive this vaccine at local pharmacy or Health Dept.or vaccine clinic. Aware to provide a copy of the vaccination record if obtained from local pharmacy or Health Dept.  Encouraged to take multivitamin, vitamin d , vitamin c and zinc  to  increase immune system. Aware can call office if would like to have vaccine here at office. Verbalized acceptance and understanding.    History of colon cancer Assessment & Plan: She has had recent surgery and has an ileostomy.    Ileostomy in place [facility])    Return for keep same next.  Patient was  given opportunity to ask questions. Patient verbalized understanding of the plan and was able to repeat key elements of the plan. All questions were answered to their satisfaction.    LILLETTE [name] Ada, [name], have reviewed all documentation for this visit. The documentation on 04/07/24 for the exam, diagnosis, procedures, and orders are all accurate and complete.   IF YOU HAVE BEEN REFERRED TO A SPECIALIST, IT MAY TAKE 1-2 WEEKS TO SCHEDULE/PROCESS THE REFERRAL. IF YOU HAVE NOT HEARD FROM US /SPECIALIST IN TWO WEEKS, PLEASE GIVE US  A CALL AT [phone].

## 2024-04-08 LAB — LIPID PANEL
Chol/HDL Ratio: 2 ratio (ref 0.0–4.4)
Cholesterol, Total: 111 mg/dL (ref 100–199)
HDL: 56 mg/dL (ref >39)
LDL Chol Calc (NIH): 39 mg/dL (ref 0–99)
Triglycerides: 82 mg/dL (ref 0–149)
VLDL Cholesterol Cal: 16 mg/dL (ref 5–40)

## 2024-04-08 LAB — TSH+FREE T4
Free T4: 1.51 ng/dL (ref 0.82–1.77)
TSH: 0.506 u[IU]/mL (ref 0.450–4.500)

## 2024-04-08 LAB — HEMOGLOBIN A1C
Est. average glucose Bld gHb Est-mCnc: 151 mg/dL
Hgb A1c MFr Bld: 6.9 % — ABNORMAL HIGH (ref 4.8–5.6)

## 2024-04-16 ENCOUNTER — Telehealth: Payer: Self-pay | Admitting: *Deleted

## 2024-04-16 NOTE — Telephone Encounter (Signed)
 Did we hear anything back from her? Would you mind checking on her?

## 2024-04-16 NOTE — Telephone Encounter (Signed)
 Spoke with [name] who states she was able to call and have an appt. With a provider at [facility] today. Pt thanked the office for calling.

## 2024-04-16 NOTE — Telephone Encounter (Signed)
 Spoke with Ms. Selmer who called the office stating she had a hysterectomy on 5/21 with Dr. Eldonna and she had been having a light pink brown vaginal discharge but last night the discharge became bright red with no clots that saturated 50% of peri pad. When this happened patient experienced some period-like cramps.  This morning she is still having the red bleeding on her pad and when she wipes on toilet tissue. Denies bleeding is heavy without clots.   Pt denies putting anything in vagina, and has followed all activity restrictions. Pt reports her ileostomy is draining, and denies all urinary symptoms. And no fever, chills or pelvic pain. Pt reports completing her antibiotics on Wednesday for a UTI.   Advised patient to call Dr. Lyda nurse at [facility] as she had her surgery there. Pt has the number and instructed patient if she is unable to reach them to call our office back. Pt verbalized understanding and thanked the office.

## 2024-04-17 NOTE — Assessment & Plan Note (Signed)
 Cholesterol levels have been normal at last check.  Continue statin.

## 2024-04-17 NOTE — Assessment & Plan Note (Signed)
 She has had recent surgery and has an ileostomy.

## 2024-04-17 NOTE — Assessment & Plan Note (Signed)
 Chronic, continue f/u with Endocrinology.

## 2024-04-17 NOTE — Assessment & Plan Note (Signed)
 Declines shingrix, educated on disease process and is aware if he changes his mind to notify office

## 2024-04-17 NOTE — Assessment & Plan Note (Signed)

## 2024-04-17 NOTE — Assessment & Plan Note (Signed)
 Continue follow-up with endocrinologist.  No recent changes with her thyroid medication.

## 2024-04-17 NOTE — Assessment & Plan Note (Deleted)
 Chronic, continue f/u with Endocrinology.

## 2024-05-18 ENCOUNTER — Telehealth: Payer: Self-pay | Admitting: *Deleted

## 2024-05-18 NOTE — Transitions of Care (Post Inpatient/ED Visit) (Signed)
   05/18/2024  Name: [name] MRN: [id number] DOB: 1965/10/24  Today's TOC FU Call Status: Today's TOC FU Call Status:: Unsuccessful Call (1st Attempt) Unsuccessful Call (1st Attempt) Date: 05/18/24  Attempted to reach the patient regarding the most recent Inpatient/ED visit.  Follow Up Plan: Additional outreach attempts will be made to reach the patient to complete the Transitions of Care (Post Inpatient/ED visit) call.   Andrea Dimes RN, BSN [place]  [facility] RN Care Manager [phone]

## 2024-05-19 ENCOUNTER — Telehealth: Payer: Self-pay | Admitting: *Deleted

## 2024-05-19 NOTE — Transitions of Care (Post Inpatient/ED Visit) (Signed)
 05/19/2024  Name: [name] MRN: [id number] DOB: 1966/09/09  Today's TOC FU Call Status: Today's TOC FU Call Status:: Successful TOC FU Call Completed TOC FU Call Complete Date: 05/19/24 Patient's Name and Date of Birth confirmed.  Transition Care Management Follow-up Telephone Call Date of Discharge: 05/17/24 Discharge Facility: Other [facility]) Name of Other (Non[facility]) Discharge Facility: [facility] Type of Discharge: Inpatient Admission Primary Inpatient Discharge Diagnosis:: ileostomy takedown How have you been since you were released from the hospital?: Better Any questions or concerns?: No  Items Reviewed: Did you receive and understand the discharge instructions provided?: Yes Medications obtained,verified, and reconciled?: Yes (Medications Reviewed) Any new allergies since your discharge?: No Dietary orders reviewed?: Yes Type of Diet Ordered:: Regular diet, encourage Boost, Ensure and Carnation Instant Breakfast Do you have support at home?: Yes People in Home [RPT]: spouse Name of Support/Comfort Primary Source: Spouse/Reggie  Medications Reviewed Today: Medications Reviewed Today     Reviewed by Lucky Andrea LABOR, RN (Registered Nurse) on 05/19/24 at 1207  Med List Status: <None>   Medication Order Taking? Sig Documenting Provider Last Dose Status Informant  Accu-Chek FastClix Lancets MISC [id number] Yes twice a day for 30 days [provider]  Active   acetaminophen  (TYLENOL ) 500 MG tablet [id number] Yes Take 1,000 mg by mouth every 6 (six) hours as needed. [provider]  Active   Aspirin-Acetaminophen -Caffeine (EXCEDRIN MIGRAINE PO) [id number] Yes Take by mouth as needed. [provider]  Active Self  Blood Glucose Monitoring Suppl (ACCU-CHEK GUIDE) w/Device KIT [id number] Yes as directed twice a day for 30 days [provider]  Active   cholecalciferol (VITAMIN D ) 400 UNITS TABS tablet [id number] Yes Take 400 Units by mouth  daily.  [provider]  Active Self  cyanocobalamin 500 MCG tablet [id number] Yes Take 500 mcg by mouth daily. Vitamin b 12 [provider]  Active Self  ELIQUIS  2.5 MG TABS tablet [id number] Yes Take 2.5 mg by mouth 2 (two) times daily. [provider]  Active   empagliflozin (JARDIANCE) 25 MG TABS tablet [id number] Yes 1 tablet Orally Once a day [provider]  Active   estradiol  (ESTRACE ) 0.1 MG/GM vaginal cream [id number] Yes Insert 1 gram intravaginally 3 x a week at bedtime. Do not use after surgery unless directed by Dr. Eldonna Epps, Eleanor BIRCH, NP  Active   Ferrous Sulfate (IRON) 325 (65 Fe) MG TABS [id number]  Take 1 tablet by mouth daily.  Patient not taking: Reported on 05/19/2024   [provider]  Active Self  gabapentin (NEURONTIN) 100 MG capsule [id number] Yes Take 100 mg by mouth 3 (three) times daily. [provider]  Active   glucose blood (ACCU-CHEK GUIDE) test strip [id number]  as directed In Vitro twice a day for 30 days [provider]  Active   ibuprofen  (ADVIL ) 200 MG tablet [id number] Yes Take 400 mg by mouth every 6 (six) hours as needed. [provider]  Active   Insulin  Lispro (HUMALOG [place]) [id number] Yes Inject 10 Units into the skin 3 (three) times daily with meals.   Patient taking differently: Inject 10 Units into the skin 3 (three) times daily as needed.   [provider]  Consider Medication Status and Discontinue (Change in therapy) Self  Insulin  Lispro Prot & Lispro (HUMALOG 75/25 MIX) (75-25) 100 UNIT/ML [place] [id number] Yes ADMINISTER 10 UNITS UNDER THE SKIN THREE TIMES DAILY AS DIRECTED [provider]  Active   JARDIANCE 25 MG TABS tablet  [id number] Yes Take 25 mg by mouth daily. [provider]  Active Self  levothyroxine  (SYNTHROID , LEVOTHROID) 100 MCG tablet [id number] Yes Take 100 mcg by mouth daily before breakfast. [provider]  Active Self           Med  Note (MORA CLEMENT EVA A   Wed Mar 22, 2020  9:46 PM) .  Magnesium  500 MG TABS [id number] Yes Take 1 tablet by mouth at bedtime. [provider]  Active Self  Multiple Vitamin (MULTIVITAMIN) tablet [id number]  Take 1 tablet by mouth daily.  Patient not taking: Reported on 05/19/2024   [provider]  Active Self  oxyCODONE  (OXY IR/ROXICODONE ) 5 MG immediate release tablet [id number] Yes Take 5 mg by mouth every 4 (four) hours as needed for severe pain (pain score 7-10). [provider]  Active     Discontinued 12/31/11 1550 (Error)   rosuvastatin (CRESTOR) 10 MG tablet [id number] Yes Take 10 mg by mouth daily. [provider]  Active Self  tirzepatide  (MOUNJARO ) 7.5 MG/0.5ML Pen [id number] Yes Inject 7.5 mg into the skin once a week. Georgina Speaks, FNP  Active   Zinc  100 MG TABS [id number]  Take 1 tablet by mouth daily.  Patient not taking: Reported on 05/19/2024   [provider]  Active Self            Home Care and Equipment/Supplies: Were Home Health Services Ordered?: No Any new equipment or medical supplies ordered?: No  Functional Questionnaire: Do you need assistance with bathing/showering or dressing?: No Do you need assistance with meal preparation?: No Do you need assistance with eating?: No Do you have difficulty maintaining continence: No Do you need assistance with getting out of bed/getting out of a chair/moving?: No Do you have difficulty managing or taking your medications?: No  Follow up appointments reviewed: PCP Follow-up appointment confirmed?: No (Patient prefers to keep next PCP visit on 07/26/24) MD Provider Line Number:[phone] Given: No Specialist Hospital Follow-up appointment confirmed?: Yes Date of Specialist follow-up appointment?: 05/27/24 Follow-Up Specialty Provider:: Post op visit with Dr Ricka Do you need transportation to your follow-up appointment?: No Do you understand care options if your  condition(s) worsen?: Yes-patient verbalized understanding  SDOH Interventions Today    Flowsheet Row Most Recent Value  SDOH Interventions   Food Insecurity Interventions Intervention Not Indicated  Housing Interventions Intervention Not Indicated  Transportation Interventions Intervention Not Indicated  Utilities Interventions Intervention Not Indicated    Andrea Dimes RN, BSN [place]  [facility] Health RN Care Manager [phone]

## 2024-06-11 ENCOUNTER — Inpatient Hospital Stay (HOSPITAL_BASED_OUTPATIENT_CLINIC_OR_DEPARTMENT_OTHER): Payer: No Typology Code available for payment source | Admitting: Oncology

## 2024-06-11 ENCOUNTER — Encounter: Payer: Self-pay | Admitting: Oncology

## 2024-06-11 ENCOUNTER — Inpatient Hospital Stay: Payer: No Typology Code available for payment source | Attending: Psychiatry

## 2024-06-11 VITALS — BP 115/94 | HR 84 | Temp 97.8°F | Resp 18 | Ht 71.0 in | Wt 172.3 lb

## 2024-06-11 DIAGNOSIS — D069 Carcinoma in situ of cervix, unspecified: Secondary | ICD-10-CM | POA: Diagnosis not present

## 2024-06-11 DIAGNOSIS — Z7901 Long term (current) use of anticoagulants: Secondary | ICD-10-CM | POA: Diagnosis not present

## 2024-06-11 DIAGNOSIS — C20 Malignant neoplasm of rectum: Secondary | ICD-10-CM | POA: Diagnosis not present

## 2024-06-11 DIAGNOSIS — Z9221 Personal history of antineoplastic chemotherapy: Secondary | ICD-10-CM | POA: Insufficient documentation

## 2024-06-11 DIAGNOSIS — Z923 Personal history of irradiation: Secondary | ICD-10-CM | POA: Insufficient documentation

## 2024-06-11 DIAGNOSIS — Z85048 Personal history of other malignant neoplasm of rectum, rectosigmoid junction, and anus: Secondary | ICD-10-CM | POA: Insufficient documentation

## 2024-06-11 DIAGNOSIS — Z86718 Personal history of other venous thrombosis and embolism: Secondary | ICD-10-CM | POA: Diagnosis not present

## 2024-06-11 DIAGNOSIS — Z86711 Personal history of pulmonary embolism: Secondary | ICD-10-CM | POA: Diagnosis not present

## 2024-06-11 LAB — CBC WITH DIFFERENTIAL (CANCER CENTER ONLY)
Abs Immature Granulocytes: 0.01 K/uL (ref 0.00–0.07)
Basophils Absolute: 0 K/uL (ref 0.0–0.1)
Basophils Relative: 1 %
Eosinophils Absolute: 0.2 K/uL (ref 0.0–0.5)
Eosinophils Relative: 5 %
HCT: 40.5 % (ref 36.0–46.0)
Hemoglobin: 13.2 g/dL (ref 12.0–15.0)
Immature Granulocytes: 0 %
Lymphocytes Relative: 26 %
Lymphs Abs: 0.9 K/uL (ref 0.7–4.0)
MCH: 29.3 pg (ref 26.0–34.0)
MCHC: 32.6 g/dL (ref 30.0–36.0)
MCV: 89.8 fL (ref 80.0–100.0)
Monocytes Absolute: 0.3 K/uL (ref 0.1–1.0)
Monocytes Relative: 9 %
Neutro Abs: 2.1 K/uL (ref 1.7–7.7)
Neutrophils Relative %: 59 %
Platelet Count: 231 K/uL (ref 150–400)
RBC: 4.51 MIL/uL (ref 3.87–5.11)
RDW: 13.2 % (ref 11.5–15.5)
WBC Count: 3.6 K/uL — ABNORMAL LOW (ref 4.0–10.5)
nRBC: 0 % (ref 0.0–0.2)

## 2024-06-11 NOTE — Progress Notes (Signed)
 [facility] OFFICE PROGRESS NOTE   Diagnosis: Rectal cancer  INTERVAL HISTORY:   [name] returns for a scheduled visit.  She continues apixaban  anticoagulation as recommended by Dr. Darcy.  She is scheduled for a follow-up visit with Dr. Darcy next year.  No bleeding or symptom of recurrent thrombosis.  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  She underwent a robotic ileocecectomy and end ileostomy 03/10/2024.  She underwent ileostomy takedown on 05/14/2024.  She has frequent loose stools.  She has been referred for pelvic physical therapy.  She plans to return to work next week.    Objective:  Vital signs in last 24 hours:  Blood pressure (!) 115/94, pulse 84, temperature 97.8 F (36.6 C), temperature source Temporal, resp. rate 18, height 5' 11 (1.803 m), weight 172 lb 4.8 oz (78.2 kg), last menstrual period 09/12/2011, SpO2 100%.   Lymphatics: No cervical, supraclavicular, axillary, or inguinal nodes Resp: Lungs clear bilaterally Cardio: Regular rate and rhythm GI: No hepatosplenomegaly, firm tissue surrounding the right ileostomy scar Vascular: No leg edema   Lab Results:  Lab Results  Component Value Date   WBC 3.6 (L) 06/11/2024   HGB 13.2 06/11/2024   HCT 40.5 06/11/2024   MCV 89.8 06/11/2024   PLT 231 06/11/2024   NEUTROABS 2.1 06/11/2024    CMP  Lab Results  Component Value Date   NA 141 12/09/2023   K 4.3 12/09/2023   CL 101 12/09/2023   CO2 24 12/09/2023   GLUCOSE 103 (H) 12/09/2023   BUN 20 12/09/2023   CREATININE 1.01 (H) 12/09/2023   CALCIUM  10.3 (H) 12/09/2023   PROT 7.3 07/21/2023   ALBUMIN  4.6 07/21/2023   AST 18 07/21/2023   ALT 27 07/21/2023   ALKPHOS 115 07/21/2023   BILITOT 0.5 07/21/2023   GFRNONAA >60 12/27/2020   GFRAA 81 12/05/2020    Lab Results  Component Value Date   CEA1 <1.00 03/04/2019   CEA <0.5 02/12/2016     Medications: I have reviewed the patient's current  medications.   Assessment/Plan: Rectal cancer, stage III (T3 N1b), status post a low anterior resection 01/31/2011, status post radiation with continuous infusion 5-FU 03/19/2011 through 04/30/2011. Status post FOLFOX beginning 07/03/2011 for 8 cycles. Surveillance colonoscopy 02/19/2012-negative for recurrent cancer or polyps. Surveillance colonoscopy 04/15/2014-entire examined colon normal; terminal ileum appeared normal. Next colonoscopy recommended at a 5 year interval. Surveillance CT scans negative 12/17/2013 Pulmonary embolism March 2012, status post IVC filter placement prior to cancer surgery in April 2012, followed by Dr. Geronimo. Risk factors for pulmonary embolism felt to be related to obesity, oral contraceptive use, rectal cancer, and sedentary job.  Coumadin  discontinued 2014, aspirin initiated Removal of Port-A-Cath March 2014 Urinary/fecal incontinence. Initially improved with the pelvic physical therapy program. Diagnosed with Covid 03/10/2020 Hospitalized 03/21/2020 with fatigue/malaise, low back pain.  She was found to have multifocal patchy airspace opacities throughout both lungs consistent with multifocal pneumonia on CT 03/21/2020. MRI of the thoracic/lumbar spine 03/22/2020 showed T7 disc herniation but also showed thrombosis of the IVC and both iliac veins to the level of the IVC filter.  Subsequently Doppler study showed extensive DVT in bilateral lower extremity.  She was started on IV heparin .  Anticoagulation changed to argatroban  due to thrombocytopenia, HIT antibody negative 03/30/2020.  The platelet count improved.  Vascular surgery was consulted.  She underwent catheter guided lytic therapy 03/28/2020, mechanical thrombectomy, IVC filter removal which was complicated by IVC tear requiring  IVC stenting 03/29/2020.  Postoperatively she developed acute blood loss anemia with hypovolemic shock, required pressors.  She stabilized and was transferred to the medical unit then developed  vomiting and was ultimately found to have an ileus/partial small bowel obstruction.  She was transitioned to Eliquis  which she continues. 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  She continues apixaban  anticoagulation under the direction of Dr. Maryln.  She is scheduled to see Dr. Darcy next year to revisit the indication for continuing anticoagulation.  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  She is followed by general surgery and GYN oncology.  She has been referred for pelvic physical therapy.  [name] will be discharged from the medical oncology clinic.  I am available to see her as needed.  Arley Hof, MD  06/11/2024  8:27 AM

## 2024-07-07 ENCOUNTER — Other Ambulatory Visit (HOSPITAL_BASED_OUTPATIENT_CLINIC_OR_DEPARTMENT_OTHER): Payer: Self-pay | Admitting: Endocrinology

## 2024-07-07 DIAGNOSIS — E1165 Type 2 diabetes mellitus with hyperglycemia: Secondary | ICD-10-CM

## 2024-07-26 ENCOUNTER — Ambulatory Visit: Payer: No Typology Code available for payment source | Admitting: Nurse Practitioner

## 2024-07-26 ENCOUNTER — Encounter: Payer: Self-pay | Admitting: Nurse Practitioner

## 2024-07-26 VITALS — BP 110/60 | HR 87 | Temp 98.7°F | Ht 71.0 in | Wt 170.0 lb

## 2024-07-26 DIAGNOSIS — E113293 Type 2 diabetes mellitus with mild nonproliferative diabetic retinopathy without macular edema, bilateral: Secondary | ICD-10-CM

## 2024-07-26 DIAGNOSIS — Z23 Encounter for immunization: Secondary | ICD-10-CM | POA: Diagnosis not present

## 2024-07-26 DIAGNOSIS — N63 Unspecified lump in unspecified breast: Secondary | ICD-10-CM

## 2024-07-26 DIAGNOSIS — M545 Low back pain, unspecified: Secondary | ICD-10-CM

## 2024-07-26 DIAGNOSIS — E039 Hypothyroidism, unspecified: Secondary | ICD-10-CM

## 2024-07-26 DIAGNOSIS — Z0000 Encounter for general adult medical examination without abnormal findings: Secondary | ICD-10-CM

## 2024-07-26 DIAGNOSIS — Z2821 Immunization not carried out because of patient refusal: Secondary | ICD-10-CM

## 2024-07-26 DIAGNOSIS — E782 Mixed hyperlipidemia: Secondary | ICD-10-CM | POA: Diagnosis not present

## 2024-07-26 DIAGNOSIS — G8929 Other chronic pain: Secondary | ICD-10-CM

## 2024-07-26 DIAGNOSIS — Z79899 Other long term (current) drug therapy: Secondary | ICD-10-CM

## 2024-07-26 DIAGNOSIS — Z139 Encounter for screening, unspecified: Secondary | ICD-10-CM

## 2024-07-26 LAB — POCT URINALYSIS DIP (CLINITEK)
Bilirubin, UA: NEGATIVE
Blood, UA: NEGATIVE
Glucose, UA: 250 mg/dL — AB
Leukocytes, UA: NEGATIVE
Nitrite, UA: NEGATIVE
POC PROTEIN,UA: 30 — AB
Spec Grav, UA: >=1.03 — AB (ref 1.010–1.025)
Urobilinogen, UA: 0.2 U/dL
pH, UA: 5.5 (ref 5.0–8.0)

## 2024-07-26 NOTE — Patient Instructions (Signed)
 Health Maintenance  Topic Date Due   Hepatitis B Vaccine (1 of 3 - 19+ 3-dose series) Never done   Zoster (Shingles) Vaccine (1 of 2) Never done   Eye exam for diabetics  12/28/2021   Breast Cancer Screening  07/01/2024   Complete foot exam   07/20/2024   COVID-19 Vaccine (1) 08/11/2024*   Flu Shot  01/18/2025*   Pneumococcal Vaccine for age over 10 (1 of 2 - PCV) 07/26/2025*   Hemoglobin A1C  10/07/2024   Yearly kidney function blood test for diabetes  12/08/2024   Yearly kidney health urinalysis for diabetes  12/08/2024   Pap with HPV screening  09/07/2028   DTaP/Tdap/Td vaccine (2 - Td or Tdap) 05/02/2031   Colon Cancer Screening  02/10/2034   Hepatitis C Screening  Completed   HIV Screening  Completed   HPV Vaccine  Aged Out   Meningitis B Vaccine  Aged Out  *Topic was postponed. The date shown is not the original due date.

## 2024-07-26 NOTE — Progress Notes (Signed)
 [name], CMA,acting as a Neurosurgeon for [name] Ada, FNP.,have documented all relevant documentation on the behalf of [name] Ada, FNP,as directed by  [name] Ada, FNP while in the presence of [name] Ada, FNP.  Subjective:    Patient ID: [name] , female    DOB: 01-Dec-1965 , 58 y.o.   MRN: [id number]  Chief Complaint  Patient presents with   Annual Exam    Patient presents today for HM, Patient reports compliance with medication. Patient denies any chest pain, SOB, or headaches. Patient has no concerns today.     Discussed the use of AI scribe software for clinical note transcription with the patient, who gave verbal consent to proceed.  History of Present Illness [name] is a 58 year old female who presents for an annual physical exam.  She underwent an ileostomy in May, which was reversed in July. Despite the reversal, she continues to experience incontinence and is currently undergoing pelvic floor physical therapy.  She was released by her oncologist after over twelve years of follow-up, and her gynecologic oncologist confirmed that everything is stable following her hysterectomy, which was related to her previous cancer treatment.  Her family history is significant for her father passing away from hypertrophic cardiomyopathy and her uncle having a massive heart attack at 85.  She experiences back pain that began lightly in the summer and worsened in September after returning to work. The pain is located in the middle of her back and is described as 'really bad' at times. She takes Advil  for relief.  Her diet is light, and she has lost some weight due to incontinence. She eats small meals and has experienced low blood sugar at times. Her last A1c check in June was 6.1, and she is not due to see her endocrinologist until March.  She sometimes walks barefoot.   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   oncologist--- dr cloretta;  dx whille pt in hospital had work-up with positive PE started coumadin  GI bleed colonscopy positive polyp;  IVC placed prior to low anterior resection of rectum 01-31-2011; Stage III,  completed chemoradiation 09/ 2012   History of cancer chemotherapy    completed 09/ 2012   History of cervical dysplasia    History of COVID-19 03/10/2020   positive covid then admitted 03-21-2020 with increased sob , dx pneumonia MRI 03-22-2020  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 with tear with removal s/p stenting   History of radiation therapy    03-19-2011 to 04-30-2011 for rectal cancer   History of vaginal dysplasia    Hyperlipidemia, mixed    Hypothyroidism    folllowed by dr balan   IDA (iron deficiency anemia)    Migraines  Type 2 diabetes mellitus (HCC)    followed by dr tommas   (12-13-2022  per pt checks blood sugar bid ,  fasting average 122-133)   Urgency of urination    VIN III (vulvar intraepithelial neoplasia III)    Wears glasses      Family History  Problem Relation Age of Onset   Lung cancer Mother    Diabetes Mother    Hypertension Mother    Heart disease Father    Diabetes Brother    Kidney disease Brother    Cervical cancer Paternal Grandmother    Breast cancer Neg Hx    Colon cancer Neg Hx    Ovarian cancer Neg Hx     Endometrial cancer Neg Hx    Pancreatic cancer Neg Hx    Prostate cancer Neg Hx      Current Outpatient Medications:    Accu-Chek FastClix Lancets MISC, twice a day for 30 days, Disp: , Rfl:    acetaminophen  (TYLENOL ) 500 MG tablet, Take 1,000 mg by mouth every 6 (six) hours as needed., Disp: , Rfl:    Aspirin-Acetaminophen -Caffeine (EXCEDRIN MIGRAINE PO), Take by mouth as needed., Disp: , Rfl:    Blood Glucose Monitoring Suppl (ACCU-CHEK GUIDE) w/Device KIT, as directed twice a day for 30 days, Disp: , Rfl:    cholecalciferol (VITAMIN D ) 400 UNITS TABS tablet, Take 400 Units by mouth daily. , Disp: , Rfl:    cyanocobalamin 500 MCG tablet, Take 500 mcg by mouth daily. Vitamin b 12, Disp: , Rfl:    ELIQUIS  2.5 MG TABS tablet, Take 2.5 mg by mouth 2 (two) times daily., Disp: , Rfl:    empagliflozin (JARDIANCE) 25 MG TABS tablet, 1 tablet Orally Once a day, Disp: , Rfl:    estradiol  (ESTRACE ) 0.1 MG/GM vaginal cream, Insert 1 gram intravaginally 3 x a week at bedtime. Do not use after surgery unless directed by Dr. Eldonna, Disp: 42.5 g, Rfl: 1   glucose blood (ACCU-CHEK GUIDE) test strip, as directed In Vitro twice a day for 30 days, Disp: , Rfl:    ibuprofen  (ADVIL ) 200 MG tablet, Take 400 mg by mouth every 6 (six) hours as needed., Disp: , Rfl:    Insulin  Lispro (HUMALOG [place]), Inject 10 Units into the skin 3 (three) times daily with meals.  (Patient taking differently: Inject 10 Units into the skin 3 (three) times daily as needed.), Disp: , Rfl:    Insulin  Lispro Prot & Lispro (HUMALOG 75/25 MIX) (75-25) 100 UNIT/ML Kwikpen, ADMINISTER 10 UNITS UNDER THE SKIN THREE TIMES DAILY AS DIRECTED, Disp: , Rfl:    JARDIANCE 25 MG TABS tablet, Take 25 mg by mouth daily., Disp: , Rfl:    levothyroxine  (SYNTHROID , LEVOTHROID) 100 MCG tablet, Take 100 mcg by mouth daily before breakfast., Disp: , Rfl:    Magnesium  500 MG TABS, Take 1 tablet by mouth at bedtime., Disp: , Rfl:    oxyCODONE  (OXY IR/ROXICODONE )  5 MG immediate release tablet, Take 5 mg by mouth every 4 (four) hours as needed for severe pain (pain score 7-10)., Disp: , Rfl:    rosuvastatin (CRESTOR) 10 MG tablet, Take 10 mg by mouth daily., Disp: , Rfl:    tirzepatide  (MOUNJARO ) 7.5 MG/0.5ML Pen, Inject 7.5 mg into the skin once a week., Disp: , Rfl:    Zinc  100 MG TABS, Take 1 tablet by mouth daily., Disp: , Rfl:    Ferrous Sulfate (IRON) 325 (65 Fe) MG TABS, Take 1 tablet by mouth  daily. (Patient not taking: Reported on 07/26/2024), Disp: , Rfl:    Multiple Vitamin (MULTIVITAMIN) tablet, Take 1 tablet by mouth daily. (Patient not taking: Reported on 07/26/2024), Disp: , Rfl:    Allergies  Allergen Reactions   Penicillins Hives   Metformin And Related Other (See Comments)    Chest pain   Levofloxacin  Other (See Comments)    Parathesia      The patient states she uses status post hysterectomy for birth control. Patient's last menstrual period was 09/12/2011.   Negative for: breast discharge, breast lump(s), breast pain and breast self exam. Associated symptoms include abnormal vaginal bleeding. Pertinent negatives include abnormal bleeding (hematology), anxiety, decreased libido, depression, difficulty falling sleep, dyspareunia, history of infertility, nocturia, sexual dysfunction, sleep disturbances, urinary incontinence, urinary urgency, vaginal discharge and vaginal itching. Diet regular.  The patient states her exercise level is minimal due to having pain to her back to thoracic area.    . The patient's tobacco use is:  Social History   Tobacco Use  Smoking Status Never  Smokeless Tobacco Never  . She has been exposed to passive smoke. The patient's alcohol use is:  Social History   Substance and Sexual Activity  Alcohol Use Not Currently   Comment: rarely  Additional information: Last pap 09/08/2023, next one scheduled for 09/07/2024.    Review of Systems  Constitutional: Negative.   HENT: Negative.    Eyes: Negative.    Respiratory: Negative.    Cardiovascular: Negative.   Gastrointestinal: Negative.   Endocrine: Negative.  Negative for polydipsia, polyphagia and polyuria.  Genitourinary: Negative.   Musculoskeletal: Negative.   Skin: Negative.   Allergic/Immunologic: Negative.   Neurological: Negative.   Hematological: Negative.   Psychiatric/Behavioral: Negative.       Today's Vitals   07/26/24 1437  BP: 110/60  Pulse: 87  Temp: 98.7 F (37.1 C)  TempSrc: Oral  Weight: 170 lb (77.1 kg)  Height: 5' 11 (1.803 m)  PainSc: 0-No pain   Body mass index is 23.71 kg/m.  Wt Readings from Last 3 Encounters:  07/26/24 170 lb (77.1 kg)  06/11/24 172 lb 4.8 oz (78.2 kg)  04/07/24 178 lb 12.8 oz (81.1 kg)     Objective:  Physical Exam Vitals and nursing note reviewed.  Constitutional:      General: She is not in acute distress.    Appearance: Normal appearance. She is well-developed and normal weight.  HENT:     Head: Normocephalic and atraumatic.     Right Ear: Hearing, tympanic membrane, ear canal and external ear normal. There is no impacted cerumen.     Left Ear: Hearing, tympanic membrane, ear canal and external ear normal. There is no impacted cerumen.     Nose: Nose normal.     Mouth/Throat:     Mouth: Mucous membranes are moist.  Eyes:     General: Lids are normal.     Extraocular Movements: Extraocular movements intact.     Conjunctiva/sclera: Conjunctivae normal.     Pupils: Pupils are equal, round, and reactive to light.     Funduscopic exam:    Right eye: No papilledema.        Left eye: No papilledema.  Neck:     Thyroid : No thyroid  mass.     Vascular: No carotid bruit.  Cardiovascular:     Rate and Rhythm: Normal rate and regular rhythm.     Pulses: Normal pulses.     Heart sounds: Normal heart sounds. No murmur  heard. Pulmonary:     Effort: Pulmonary effort is normal. No respiratory distress.     Breath sounds: Normal breath sounds. No wheezing.  Chest:     Chest  wall: No mass.  Breasts:    Tanner Score is 5.     Right: Normal. No mass or tenderness.     Left: Normal. No mass or tenderness.  Abdominal:     General: Abdomen is flat. Bowel sounds are normal. There is no distension.     Palpations: Abdomen is soft.     Tenderness: There is no abdominal tenderness.  Genitourinary:    Comments: Deferred - managed by Gyn Musculoskeletal:        General: No swelling or tenderness. Normal range of motion.     Cervical back: Full passive range of motion without pain, normal range of motion and neck supple.     Right lower leg: No edema.     Left lower leg: No edema.  Lymphadenopathy:     Upper Body:     Right upper body: No supraclavicular, axillary or pectoral adenopathy.     Left upper body: No supraclavicular, axillary or pectoral adenopathy.  Skin:    General: Skin is warm and dry.     Capillary Refill: Capillary refill takes less than 2 seconds.  Neurological:     General: No focal deficit present.     Mental Status: She is alert and oriented to person, place, and time.     Cranial Nerves: No cranial nerve deficit.     Sensory: No sensory deficit.     Motor: No weakness.  Psychiatric:        Mood and Affect: Mood normal.        Behavior: Behavior normal.        Thought Content: Thought content normal.        Judgment: Judgment normal.      Assessment And Plan:     Encounter for annual health examination Assessment & Plan: Discussed general health maintenance including eye exam and upcoming Pap smear. Scheduled shingles vaccine. - Administer shingles vaccine. - Schedule follow-up for second dose of shingles vaccine in 2-6 months.   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 Last A1c 6.1% in June. Blood sugar management affected by dietary  changes due to incontinence. - Check A1c level today.  Orders: -     EKG 12-Lead -     POCT URINALYSIS DIP (CLINITEK) -     Microalbumin / creatinine urine ratio -     CMP14+EGFR -     Hemoglobin A1c  Hypothyroidism, unspecified type Assessment & Plan: Continue follow-up with endocrinologist.  No recent changes with her thyroid  medication.  Orders: -     TSH + free T4  Mixed hyperlipidemia Assessment & Plan: Cholesterol levels have been normal at last check.  Continue statin.  Orders: -     Lipid panel  Breast nodule Assessment & Plan: Palpable mass in left breast, possibly a cyst. Tenderness atypical for malignancy, further evaluation needed due to history. - Order diagnostic mammogram for left breast.  Orders: -     US  LIMITED ULTRASOUND INCLUDING AXILLA LEFT BREAST ; Future  Influenza vaccination declined  Need for zoster vaccination Assessment & Plan: Shingles immunization given #2.  Orders: -     Varicella-zoster vaccine IM  Other long term (current) drug therapy  Encounter for screening -     Hepatitis B surface antibody,qualitative  Chronic bilateral low back pain without sciatica Assessment & Plan: Chronic middle back pain, likely musculoskeletal, exacerbated by work and sitting. No x-ray needed, likely muscular. - Provide Voltaren gel for topical application. - Advise on stretching exercises.     Return for 1 year physical, controlled DM check 4 months. Patient was given opportunity to ask questions. Patient verbalized understanding of the plan and was able to repeat key elements of the plan. All questions were answered to their satisfaction.   [name] Ada, FNP  I, [name] Ada, FNP, have reviewed all documentation for this visit. The documentation on 07/26/24 for the exam, diagnosis, procedures, and orders are all accurate and complete.

## 2024-07-27 ENCOUNTER — Other Ambulatory Visit: Payer: Self-pay | Admitting: Nurse Practitioner

## 2024-07-27 DIAGNOSIS — N63 Unspecified lump in unspecified breast: Secondary | ICD-10-CM

## 2024-07-27 LAB — CMP14+EGFR
ALT: 18 IU/L (ref 0–32)
AST: 16 IU/L (ref 0–40)
Albumin: 4.8 g/dL (ref 3.8–4.9)
Alkaline Phosphatase: 110 IU/L (ref 49–135)
BUN/Creatinine Ratio: 11 (ref 9–23)
BUN: 15 mg/dL (ref 6–24)
Bilirubin Total: 0.6 mg/dL (ref 0.0–1.2)
CO2: 24 mmol/L (ref 20–29)
Calcium: 10.2 mg/dL (ref 8.7–10.2)
Chloride: 106 mmol/L (ref 96–106)
Creatinine, Ser: 1.34 mg/dL — ABNORMAL HIGH (ref 0.57–1.00)
Globulin, Total: 3 g/dL (ref 1.5–4.5)
Glucose: 108 mg/dL — ABNORMAL HIGH (ref 70–99)
Potassium: 4.6 mmol/L (ref 3.5–5.2)
Sodium: 145 mmol/L — ABNORMAL HIGH (ref 134–144)
Total Protein: 7.8 g/dL (ref 6.0–8.5)
eGFR: 46 mL/min/1.73 — ABNORMAL LOW (ref >59)

## 2024-07-27 LAB — TSH+FREE T4
Free T4: 1.13 ng/dL (ref 0.82–1.77)
TSH: 6.17 u[IU]/mL — ABNORMAL HIGH (ref 0.450–4.500)

## 2024-07-27 LAB — MICROALBUMIN / CREATININE URINE RATIO
Creatinine, Urine: 314.4 mg/dL
Microalb/Creat Ratio: 11 mg/g{creat} (ref 0–29)
Microalbumin, Urine: 34.3 ug/mL

## 2024-07-27 LAB — HEPATITIS B SURFACE ANTIBODY,QUALITATIVE: Hep B Surface Ab, Qual: NONREACTIVE

## 2024-07-27 LAB — HEMOGLOBIN A1C
Est. average glucose Bld gHb Est-mCnc: 128 mg/dL
Hgb A1c MFr Bld: 6.1 % — ABNORMAL HIGH (ref 4.8–5.6)

## 2024-07-27 LAB — LIPID PANEL
Chol/HDL Ratio: 2.5 ratio (ref 0.0–4.4)
Cholesterol, Total: 153 mg/dL (ref 100–199)
HDL: 62 mg/dL (ref >39)
LDL Chol Calc (NIH): 64 mg/dL (ref 0–99)
Triglycerides: 159 mg/dL — ABNORMAL HIGH (ref 0–149)
VLDL Cholesterol Cal: 27 mg/dL (ref 5–40)

## 2024-07-30 ENCOUNTER — Ambulatory Visit (HOSPITAL_BASED_OUTPATIENT_CLINIC_OR_DEPARTMENT_OTHER)
Admission: RE | Admit: 2024-07-30 | Discharge: 2024-07-30 | Disposition: A | Discharge status: Home / Self Care | Payer: Self-pay | Source: Ambulatory Visit | Attending: Endocrinology | Admitting: Endocrinology

## 2024-07-30 DIAGNOSIS — E1165 Type 2 diabetes mellitus with hyperglycemia: Secondary | ICD-10-CM | POA: Insufficient documentation

## 2024-08-04 ENCOUNTER — Ambulatory Visit: Payer: Self-pay | Admitting: Nurse Practitioner

## 2024-08-04 DIAGNOSIS — M545 Low back pain, unspecified: Secondary | ICD-10-CM | POA: Insufficient documentation

## 2024-08-04 DIAGNOSIS — N63 Unspecified lump in unspecified breast: Secondary | ICD-10-CM | POA: Insufficient documentation

## 2024-08-04 NOTE — Assessment & Plan Note (Signed)
 Shingles immunization given #2.

## 2024-08-04 NOTE — Assessment & Plan Note (Signed)
 Cholesterol levels have been normal at last check.  Continue statin.

## 2024-08-04 NOTE — Assessment & Plan Note (Signed)
 Palpable mass in left breast, possibly a cyst. Tenderness atypical for malignancy, further evaluation needed due to history. - Order diagnostic mammogram for left breast.

## 2024-08-04 NOTE — Assessment & Plan Note (Signed)
 Continue follow-up with endocrinologist.  No recent changes with her thyroid medication.

## 2024-08-04 NOTE — Assessment & Plan Note (Signed)
 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 Last A1c 6.1% in June. Blood sugar management affected by dietary changes due to incontinence. - Check A1c level today.

## 2024-08-04 NOTE — Assessment & Plan Note (Signed)
 Discussed general health maintenance including eye exam and upcoming Pap smear. Scheduled shingles vaccine. - Administer shingles vaccine. - Schedule follow-up for second dose of shingles vaccine in 2-6 months.

## 2024-08-04 NOTE — Assessment & Plan Note (Signed)
 Chronic middle back pain, likely musculoskeletal, exacerbated by work and sitting. No x-ray needed, likely muscular. - Provide Voltaren gel for topical application. - Advise on stretching exercises.

## 2024-08-17 ENCOUNTER — Ambulatory Visit: Admission: RE | Admit: 2024-08-17 | Source: Ambulatory Visit

## 2024-08-17 ENCOUNTER — Ambulatory Visit
Admission: RE | Admit: 2024-08-17 | Discharge: 2024-08-17 | Disposition: A | Discharge status: Home / Self Care | Source: Ambulatory Visit | Attending: Nurse Practitioner | Admitting: Nurse Practitioner

## 2024-08-17 DIAGNOSIS — N63 Unspecified lump in unspecified breast: Secondary | ICD-10-CM

## 2024-08-24 ENCOUNTER — Other Ambulatory Visit (HOSPITAL_COMMUNITY)
Admission: RE | Admit: 2024-08-24 | Discharge: 2024-08-24 | Disposition: A | Discharge status: Home / Self Care | Source: Ambulatory Visit | Attending: Surgical Oncology | Admitting: Surgical Oncology

## 2024-08-24 DIAGNOSIS — R197 Diarrhea, unspecified: Secondary | ICD-10-CM | POA: Insufficient documentation

## 2024-08-24 LAB — CLOSTRIDIUM DIFFICILE BY PCR, REFLEXED
Hypervirulent Strain: NEGATIVE
Toxigenic C. Difficile by PCR: NEGATIVE

## 2024-08-24 LAB — C DIFFICILE QUICK SCREEN W PCR REFLEX
C Diff antigen: POSITIVE — AB
C Diff toxin: NEGATIVE

## 2024-09-27 ENCOUNTER — Other Ambulatory Visit (HOSPITAL_COMMUNITY)
Admission: RE | Admit: 2024-09-27 | Discharge: 2024-09-27 | Disposition: A | Source: Ambulatory Visit | Attending: Surgical Oncology | Admitting: Surgical Oncology

## 2024-10-01 ENCOUNTER — Other Ambulatory Visit (HOSPITAL_COMMUNITY)
Admission: RE | Admit: 2024-10-01 | Discharge: 2024-10-01 | Disposition: A | Source: Ambulatory Visit | Attending: Surgical Oncology | Admitting: Surgical Oncology

## 2024-10-01 LAB — C DIFFICILE QUICK SCREEN W PCR REFLEX
C Diff antigen: NEGATIVE
C Diff interpretation: NOT DETECTED
C Diff toxin: NEGATIVE

## 2024-11-30 ENCOUNTER — Ambulatory Visit: Payer: Self-pay | Admitting: Nurse Practitioner

## 2025-07-28 ENCOUNTER — Encounter: Payer: Self-pay | Admitting: Nurse Practitioner
